# Patient Record
Sex: Male | Born: 1935
Health system: Southern US, Community
[De-identification: ages and names within clinical notes are randomized; demographics above are authoritative.]

## PROBLEM LIST (undated history)

## (undated) DIAGNOSIS — I2119 ST elevation (STEMI) myocardial infarction involving other coronary artery of inferior wall: Secondary | ICD-10-CM

## (undated) DIAGNOSIS — M519 Unspecified thoracic, thoracolumbar and lumbosacral intervertebral disc disorder: Secondary | ICD-10-CM

## (undated) DIAGNOSIS — I70229 Atherosclerosis of native arteries of extremities with rest pain, unspecified extremity: Secondary | ICD-10-CM

## (undated) DIAGNOSIS — I509 Heart failure, unspecified: Secondary | ICD-10-CM

## (undated) DIAGNOSIS — N289 Disorder of kidney and ureter, unspecified: Secondary | ICD-10-CM

## (undated) DIAGNOSIS — I2510 Atherosclerotic heart disease of native coronary artery without angina pectoris: Secondary | ICD-10-CM

## (undated) DIAGNOSIS — IMO0001 Reserved for inherently not codable concepts without codable children: Secondary | ICD-10-CM

## (undated) DIAGNOSIS — D649 Anemia, unspecified: Secondary | ICD-10-CM

## (undated) DIAGNOSIS — C4491 Basal cell carcinoma of skin, unspecified: Secondary | ICD-10-CM

## (undated) DIAGNOSIS — M545 Low back pain, unspecified: Secondary | ICD-10-CM

## (undated) DIAGNOSIS — R5383 Other fatigue: Secondary | ICD-10-CM

## (undated) DIAGNOSIS — Z9289 Personal history of other medical treatment: Secondary | ICD-10-CM

## (undated) DIAGNOSIS — E46 Unspecified protein-calorie malnutrition: Secondary | ICD-10-CM

## (undated) DIAGNOSIS — I2699 Other pulmonary embolism without acute cor pulmonale: Secondary | ICD-10-CM

## (undated) DIAGNOSIS — I5022 Chronic systolic (congestive) heart failure: Secondary | ICD-10-CM

## (undated) DIAGNOSIS — R011 Cardiac murmur, unspecified: Secondary | ICD-10-CM

## (undated) DIAGNOSIS — C9000 Multiple myeloma not having achieved remission: Secondary | ICD-10-CM

## (undated) DIAGNOSIS — I255 Ischemic cardiomyopathy: Secondary | ICD-10-CM

## (undated) DIAGNOSIS — I998 Other disorder of circulatory system: Secondary | ICD-10-CM

## (undated) DIAGNOSIS — Z8619 Personal history of other infectious and parasitic diseases: Secondary | ICD-10-CM

## (undated) DIAGNOSIS — G8929 Other chronic pain: Secondary | ICD-10-CM

## (undated) DIAGNOSIS — Z5189 Encounter for other specified aftercare: Secondary | ICD-10-CM

## (undated) DIAGNOSIS — E785 Hyperlipidemia, unspecified: Secondary | ICD-10-CM

## (undated) DIAGNOSIS — M869 Osteomyelitis, unspecified: Secondary | ICD-10-CM

## (undated) DIAGNOSIS — G629 Polyneuropathy, unspecified: Secondary | ICD-10-CM

## (undated) DIAGNOSIS — I10 Essential (primary) hypertension: Secondary | ICD-10-CM

## (undated) DIAGNOSIS — I739 Peripheral vascular disease, unspecified: Secondary | ICD-10-CM

## (undated) DIAGNOSIS — R0602 Shortness of breath: Secondary | ICD-10-CM

## (undated) HISTORY — DX: Cardiac murmur, unspecified: R01.1

## (undated) HISTORY — DX: Polyneuropathy, unspecified: G62.9

## (undated) HISTORY — PX: FRACTURE SURGERY: SHX138

## (undated) HISTORY — PX: CATARACT EXTRACTION W/ INTRAOCULAR LENS  IMPLANT, BILATERAL: SHX1307

## (undated) HISTORY — DX: Atherosclerosis of native arteries of extremities with rest pain, unspecified extremity: I70.229

## (undated) HISTORY — PX: TONSILLECTOMY: SUR1361

## (undated) HISTORY — DX: Chronic systolic (congestive) heart failure: I50.22

## (undated) HISTORY — DX: Peripheral vascular disease, unspecified: I73.9

## (undated) HISTORY — PX: INGUINAL HERNIA REPAIR: SUR1180

## (undated) HISTORY — DX: Osteomyelitis, unspecified: M86.9

## (undated) HISTORY — DX: ST elevation (STEMI) myocardial infarction involving other coronary artery of inferior wall: I21.19

## (undated) HISTORY — PX: BACK SURGERY: SHX140

## (undated) HISTORY — DX: Hyperlipidemia, unspecified: E78.5

## (undated) HISTORY — DX: Atherosclerotic heart disease of native coronary artery without angina pectoris: I25.10

## (undated) HISTORY — DX: Other disorder of circulatory system: I99.8

## (undated) HISTORY — PX: TOE AMPUTATION: SHX809

## (undated) HISTORY — DX: Essential (primary) hypertension: I10

## (undated) HISTORY — PX: BASAL CELL CARCINOMA EXCISION: SHX1214

## (undated) HISTORY — DX: Heart failure, unspecified: I50.9

## (undated) HISTORY — DX: Other fatigue: R53.83

## (undated) HISTORY — DX: Unspecified protein-calorie malnutrition: E46

## (undated) HISTORY — DX: Personal history of other medical treatment: Z92.89

## (undated) HISTORY — DX: Unspecified thoracic, thoracolumbar and lumbosacral intervertebral disc disorder: M51.9

## (undated) HISTORY — DX: Disorder of kidney and ureter, unspecified: N28.9

## (undated) HISTORY — DX: Shortness of breath: R06.02

## (undated) HISTORY — PX: LUMBAR LAMINECTOMY: SHX95

## (undated) HISTORY — DX: Ischemic cardiomyopathy: I25.5

---

## 1984-11-20 DIAGNOSIS — I2119 ST elevation (STEMI) myocardial infarction involving other coronary artery of inferior wall: Secondary | ICD-10-CM

## 1984-11-20 HISTORY — DX: ST elevation (STEMI) myocardial infarction involving other coronary artery of inferior wall: I21.19

## 1998-03-24 ENCOUNTER — Other Ambulatory Visit: Admission: RE | Admit: 1998-03-24 | Discharge: 1998-03-24 | Payer: Self-pay | Admitting: *Deleted

## 2000-06-20 ENCOUNTER — Ambulatory Visit (HOSPITAL_COMMUNITY): Admission: RE | Admit: 2000-06-20 | Discharge: 2000-06-20 | Payer: Self-pay | Admitting: Cardiology

## 2000-06-20 ENCOUNTER — Encounter: Payer: Self-pay | Admitting: Cardiology

## 2000-06-20 HISTORY — PX: CARDIAC CATHETERIZATION: SHX172

## 2000-06-22 ENCOUNTER — Inpatient Hospital Stay (HOSPITAL_COMMUNITY)
Admission: RE | Admit: 2000-06-22 | Discharge: 2000-06-28 | Payer: Self-pay | Admitting: Thoracic Surgery (Cardiothoracic Vascular Surgery)

## 2000-06-22 ENCOUNTER — Encounter: Payer: Self-pay | Admitting: Thoracic Surgery (Cardiothoracic Vascular Surgery)

## 2000-06-23 ENCOUNTER — Encounter: Payer: Self-pay | Admitting: Thoracic Surgery (Cardiothoracic Vascular Surgery)

## 2000-06-24 ENCOUNTER — Encounter: Payer: Self-pay | Admitting: Thoracic Surgery (Cardiothoracic Vascular Surgery)

## 2000-06-25 ENCOUNTER — Encounter: Payer: Self-pay | Admitting: Thoracic Surgery (Cardiothoracic Vascular Surgery)

## 2000-08-15 ENCOUNTER — Encounter (HOSPITAL_COMMUNITY): Admission: RE | Admit: 2000-08-15 | Discharge: 2000-11-13 | Payer: Self-pay | Admitting: Cardiology

## 2000-11-14 ENCOUNTER — Encounter (HOSPITAL_COMMUNITY): Admission: RE | Admit: 2000-11-14 | Discharge: 2000-11-29 | Payer: Self-pay | Admitting: Cardiology

## 2000-11-20 HISTORY — PX: CORONARY ARTERY BYPASS GRAFT: SHX141

## 2000-11-27 ENCOUNTER — Encounter (INDEPENDENT_AMBULATORY_CARE_PROVIDER_SITE_OTHER): Payer: Self-pay | Admitting: Specialist

## 2000-11-27 ENCOUNTER — Ambulatory Visit (HOSPITAL_BASED_OUTPATIENT_CLINIC_OR_DEPARTMENT_OTHER): Admission: RE | Admit: 2000-11-27 | Discharge: 2000-11-27 | Payer: Self-pay

## 2000-12-31 ENCOUNTER — Encounter (HOSPITAL_COMMUNITY): Admission: RE | Admit: 2000-12-31 | Discharge: 2001-03-31 | Payer: Self-pay | Admitting: Cardiology

## 2001-04-01 ENCOUNTER — Encounter (HOSPITAL_COMMUNITY): Admission: RE | Admit: 2001-04-01 | Discharge: 2001-06-30 | Payer: Self-pay | Admitting: Cardiology

## 2001-07-01 ENCOUNTER — Encounter (HOSPITAL_COMMUNITY): Admission: RE | Admit: 2001-07-01 | Discharge: 2001-09-29 | Payer: Self-pay | Admitting: Cardiology

## 2002-01-17 ENCOUNTER — Ambulatory Visit (HOSPITAL_COMMUNITY): Admission: RE | Admit: 2002-01-17 | Discharge: 2002-01-17 | Payer: Self-pay | Admitting: Gastroenterology

## 2006-02-14 ENCOUNTER — Ambulatory Visit: Payer: Self-pay | Admitting: Oncology

## 2006-02-21 LAB — COMPREHENSIVE METABOLIC PANEL
ALT: 13 U/L (ref 0–40)
Albumin: 3.9 g/dL (ref 3.5–5.2)
Alkaline Phosphatase: 41 U/L (ref 39–117)
CO2: 22 mEq/L (ref 19–32)
Potassium: 5.1 mEq/L (ref 3.5–5.3)
Sodium: 139 mEq/L (ref 135–145)
Total Bilirubin: 0.5 mg/dL (ref 0.3–1.2)
Total Protein: 6.4 g/dL (ref 6.0–8.3)

## 2006-02-21 LAB — CBC WITH DIFFERENTIAL/PLATELET
BASO%: 0.2 % (ref 0.0–2.0)
EOS%: 0.8 % (ref 0.0–7.0)
MCH: 35.5 pg — ABNORMAL HIGH (ref 28.0–33.4)
MCHC: 33.9 g/dL (ref 32.0–35.9)
MONO#: 0.3 10*3/uL (ref 0.1–0.9)
RBC: 2.66 10*6/uL — ABNORMAL LOW (ref 4.20–5.71)
RDW: 15.1 % — ABNORMAL HIGH (ref 11.2–14.6)
WBC: 5.1 10*3/uL (ref 4.0–10.0)
lymph#: 1.3 10*3/uL (ref 0.9–3.3)

## 2006-02-22 LAB — PROTEIN ELECTROPHORESIS, SERUM
Alpha-2-Globulin: 12.5 % — ABNORMAL HIGH (ref 7.1–11.8)
Beta Globulin: 5.8 % (ref 4.7–7.2)
Gamma Globulin: 12.9 % (ref 11.1–18.8)

## 2006-02-22 LAB — IRON AND TIBC
%SAT: 24 % (ref 20–55)
Iron: 65 ug/dL (ref 42–165)
UIBC: 204 ug/dL

## 2006-02-22 LAB — FOLATE: Folate: >20 ng/mL

## 2006-04-02 ENCOUNTER — Ambulatory Visit: Payer: Self-pay | Admitting: Oncology

## 2006-04-04 LAB — CBC WITH DIFFERENTIAL/PLATELET
BASO%: 0.3 % (ref 0.0–2.0)
Eosinophils Absolute: 0 10*3/uL (ref 0.0–0.5)
LYMPH%: 18.2 % (ref 14.0–48.0)
MCHC: 34.6 g/dL (ref 32.0–35.9)
MONO#: 0.4 10*3/uL (ref 0.1–0.9)
MONO%: 6.5 % (ref 0.0–13.0)
NEUT#: 5.1 10*3/uL (ref 1.5–6.5)
Platelets: 221 10*3/uL (ref 145–400)
RBC: 2.6 10*6/uL — ABNORMAL LOW (ref 4.20–5.71)
RDW: 15.1 % — ABNORMAL HIGH (ref 11.2–14.6)
WBC: 6.9 10*3/uL (ref 4.0–10.0)

## 2006-04-17 ENCOUNTER — Inpatient Hospital Stay (HOSPITAL_COMMUNITY): Admission: EM | Admit: 2006-04-17 | Discharge: 2006-04-24 | Payer: Self-pay | Admitting: Emergency Medicine

## 2006-04-18 ENCOUNTER — Ambulatory Visit: Payer: Self-pay | Admitting: Oncology

## 2006-04-23 ENCOUNTER — Encounter (INDEPENDENT_AMBULATORY_CARE_PROVIDER_SITE_OTHER): Payer: Self-pay | Admitting: Specialist

## 2006-04-26 ENCOUNTER — Ambulatory Visit (HOSPITAL_COMMUNITY): Admission: RE | Admit: 2006-04-26 | Discharge: 2006-04-26 | Payer: Self-pay | Admitting: Oncology

## 2006-04-26 ENCOUNTER — Encounter (INDEPENDENT_AMBULATORY_CARE_PROVIDER_SITE_OTHER): Payer: Self-pay | Admitting: *Deleted

## 2006-05-03 LAB — CBC WITH DIFFERENTIAL/PLATELET
Basophils Absolute: 0 10*3/uL (ref 0.0–0.1)
EOS%: 1 % (ref 0.0–7.0)
HCT: 31.5 % — ABNORMAL LOW (ref 38.7–49.9)
HGB: 10.7 g/dL — ABNORMAL LOW (ref 13.0–17.1)
LYMPH%: 21.3 % (ref 14.0–48.0)
MCH: 33.3 pg (ref 28.0–33.4)
MCV: 98.4 fL — ABNORMAL HIGH (ref 81.6–98.0)
MONO%: 8.8 % (ref 0.0–13.0)
NEUT%: 68.5 % (ref 40.0–75.0)

## 2006-05-04 LAB — IGG, IGA, IGM: IgM, Serum: 26 mg/dL — ABNORMAL LOW (ref 60–263)

## 2006-05-04 LAB — BETA 2 MICROGLOBULIN, SERUM: Beta-2 Microglobulin: 6.82 mg/L — ABNORMAL HIGH (ref 1.01–1.73)

## 2006-05-04 LAB — COMPREHENSIVE METABOLIC PANEL
ALT: 15 U/L (ref 0–40)
AST: 21 U/L (ref 0–37)
Alkaline Phosphatase: 36 U/L — ABNORMAL LOW (ref 39–117)
Sodium: 138 mEq/L (ref 135–145)
Total Bilirubin: 0.4 mg/dL (ref 0.3–1.2)
Total Protein: 6.5 g/dL (ref 6.0–8.3)

## 2006-05-09 LAB — CBC WITH DIFFERENTIAL/PLATELET
BASO%: 0.1 % (ref 0.0–2.0)
Eosinophils Absolute: 0 10*3/uL (ref 0.0–0.5)
LYMPH%: 18.7 % (ref 14.0–48.0)
MCH: 33.3 pg (ref 28.0–33.4)
MCHC: 34.2 g/dL (ref 32.0–35.9)
MCV: 97.5 fL (ref 81.6–98.0)
MONO%: 0.6 % (ref 0.0–13.0)
Platelets: 284 10*3/uL (ref 145–400)
RBC: 2.95 10*6/uL — ABNORMAL LOW (ref 4.20–5.71)

## 2006-05-09 LAB — PROTIME-INR: Protime: 18.9 Seconds — ABNORMAL HIGH (ref 10.6–13.4)

## 2006-05-14 ENCOUNTER — Ambulatory Visit: Payer: Self-pay | Admitting: Oncology

## 2006-05-16 ENCOUNTER — Ambulatory Visit (HOSPITAL_COMMUNITY): Admission: RE | Admit: 2006-05-16 | Discharge: 2006-05-16 | Payer: Self-pay | Admitting: Oncology

## 2006-05-16 LAB — CBC WITH DIFFERENTIAL/PLATELET
Basophils Absolute: 0 10*3/uL (ref 0.0–0.1)
Eosinophils Absolute: 0.2 10*3/uL (ref 0.0–0.5)
LYMPH%: 17.2 % (ref 14.0–48.0)
MCV: 99 fL — ABNORMAL HIGH (ref 81.6–98.0)
MONO%: 3.2 % (ref 0.0–13.0)
NEUT#: 6.8 10*3/uL — ABNORMAL HIGH (ref 1.5–6.5)
Platelets: 306 10*3/uL (ref 145–400)
RBC: 3.02 10*6/uL — ABNORMAL LOW (ref 4.20–5.71)

## 2006-05-16 LAB — BASIC METABOLIC PANEL
BUN: 53 mg/dL — ABNORMAL HIGH (ref 6–23)
CO2: 24 mEq/L (ref 19–32)
Chloride: 103 mEq/L (ref 96–112)
Glucose, Bld: 105 mg/dL — ABNORMAL HIGH (ref 70–99)
Potassium: 4.5 mEq/L (ref 3.5–5.3)

## 2006-05-16 LAB — PROTIME-INR

## 2006-05-18 ENCOUNTER — Ambulatory Visit (HOSPITAL_COMMUNITY): Admission: RE | Admit: 2006-05-18 | Discharge: 2006-05-18 | Payer: Self-pay | Admitting: Oncology

## 2006-05-18 LAB — PROTIME-INR
INR: 3.1 (ref 2.00–3.50)
Protime: 21.3 Seconds — ABNORMAL HIGH (ref 10.6–13.4)

## 2006-05-24 LAB — PROTIME-INR
INR: 1.4 — ABNORMAL LOW (ref 2.00–3.50)
Protime: 14.5 Seconds — ABNORMAL HIGH (ref 10.6–13.4)

## 2006-05-24 LAB — CBC WITH DIFFERENTIAL/PLATELET
Basophils Absolute: 0 10*3/uL (ref 0.0–0.1)
Eosinophils Absolute: 0.1 10*3/uL (ref 0.0–0.5)
HGB: 9.3 g/dL — ABNORMAL LOW (ref 13.0–17.1)
LYMPH%: 19.7 % (ref 14.0–48.0)
MONO#: 0.6 10*3/uL (ref 0.1–0.9)
NEUT#: 6.5 10*3/uL (ref 1.5–6.5)
Platelets: 223 10*3/uL (ref 145–400)
RBC: 2.82 10*6/uL — ABNORMAL LOW (ref 4.20–5.71)
WBC: 9.1 10*3/uL (ref 4.0–10.0)

## 2006-05-24 LAB — BASIC METABOLIC PANEL
Glucose, Bld: 77 mg/dL (ref 70–99)
Potassium: 4.3 mEq/L (ref 3.5–5.3)
Sodium: 139 mEq/L (ref 135–145)

## 2006-05-30 LAB — CBC WITH DIFFERENTIAL/PLATELET
Eosinophils Absolute: 0.1 10*3/uL (ref 0.0–0.5)
HCT: 27.3 % — ABNORMAL LOW (ref 38.7–49.9)
LYMPH%: 40.7 % (ref 14.0–48.0)
MONO#: 0.6 10*3/uL (ref 0.1–0.9)
NEUT#: 2.1 10*3/uL (ref 1.5–6.5)
NEUT%: 42.6 % (ref 40.0–75.0)
Platelets: 296 10*3/uL (ref 145–400)
WBC: 4.9 10*3/uL (ref 4.0–10.0)

## 2006-06-01 LAB — COMPREHENSIVE METABOLIC PANEL
ALT: 18 U/L (ref 0–40)
AST: 17 U/L (ref 0–37)
CO2: 23 mEq/L (ref 19–32)
Chloride: 109 mEq/L (ref 96–112)
Sodium: 143 mEq/L (ref 135–145)
Total Bilirubin: 0.5 mg/dL (ref 0.3–1.2)
Total Protein: 5.4 g/dL — ABNORMAL LOW (ref 6.0–8.3)

## 2006-06-01 LAB — IMMUNOFIXATION ELECTROPHORESIS
IgG (Immunoglobin G), Serum: 707 mg/dL (ref 694–1618)
IgM, Serum: 21 mg/dL — ABNORMAL LOW (ref 60–263)
Total Protein, Serum Electrophoresis: 5.4 g/dL — ABNORMAL LOW (ref 6.0–8.3)

## 2006-06-01 LAB — URIC ACID: Uric Acid, Serum: 6.1 mg/dL (ref 2.4–7.0)

## 2006-06-06 LAB — BASIC METABOLIC PANEL
CO2: 25 mEq/L (ref 19–32)
Calcium: 8.8 mg/dL (ref 8.4–10.5)
Creatinine, Ser: 1.45 mg/dL (ref 0.40–1.50)
Glucose, Bld: 102 mg/dL — ABNORMAL HIGH (ref 70–99)

## 2006-06-06 LAB — CBC WITH DIFFERENTIAL/PLATELET
BASO%: 0.7 % (ref 0.0–2.0)
EOS%: 4.9 % (ref 0.0–7.0)
HCT: 26.4 % — ABNORMAL LOW (ref 38.7–49.9)
MCH: 32 pg (ref 28.0–33.4)
MCHC: 33.8 g/dL (ref 32.0–35.9)
NEUT%: 45.9 % (ref 40.0–75.0)
RBC: 2.79 10*6/uL — ABNORMAL LOW (ref 4.20–5.71)
RDW: 19.5 % — ABNORMAL HIGH (ref 11.2–14.6)
WBC: 5.9 10*3/uL (ref 4.0–10.0)
lymph#: 2.3 10*3/uL (ref 0.9–3.3)

## 2006-06-06 LAB — PROTIME-INR: Protime: 18.3 Seconds — ABNORMAL HIGH (ref 10.6–13.4)

## 2006-06-07 LAB — UIFE/LIGHT CHAINS/TP QN, 24-HR UR
Free Kappa Lt Chains,Ur: 755 mg/dL — ABNORMAL HIGH (ref 0.04–1.51)
Free Kappa/Lambda Ratio: 1217.74 ratio — ABNORMAL HIGH (ref 0.46–4.00)
Free Lt Chn Excr Rate: 19630 mg/d
Gamma Globulin, Urine: DETECTED — AB
Total Protein, Urine: 757.4 mg/dL
Volume, Urine: 2600 mL

## 2006-06-20 LAB — CBC WITH DIFFERENTIAL/PLATELET
BASO%: 0.3 % (ref 0.0–2.0)
EOS%: 2.2 % (ref 0.0–7.0)
HCT: 25.9 % — ABNORMAL LOW (ref 38.7–49.9)
MCH: 31.8 pg (ref 28.0–33.4)
MCHC: 33.5 g/dL (ref 32.0–35.9)
MCV: 94.9 fL (ref 81.6–98.0)
MONO%: 12.6 % (ref 0.0–13.0)
NEUT%: 64.8 % (ref 40.0–75.0)
lymph#: 1.7 10*3/uL (ref 0.9–3.3)

## 2006-06-28 ENCOUNTER — Ambulatory Visit (HOSPITAL_COMMUNITY): Admission: RE | Admit: 2006-06-28 | Discharge: 2006-06-28 | Payer: Self-pay | Admitting: Neurological Surgery

## 2006-06-28 LAB — UIFE/LIGHT CHAINS/TP QN, 24-HR UR
Alpha 2, Urine: DETECTED — AB
Beta, Urine: DETECTED — AB
Free Kappa Lt Chains,Ur: 261 mg/dL — ABNORMAL HIGH (ref 0.04–1.51)
Free Kappa/Lambda Ratio: 686.84 ratio — ABNORMAL HIGH (ref 0.46–4.00)
Free Lt Chn Excr Rate: 7047 mg/d
Gamma Globulin, Urine: DETECTED — AB
Total Protein, Urine: 262.4 mg/dL

## 2006-07-02 ENCOUNTER — Ambulatory Visit: Payer: Self-pay | Admitting: Oncology

## 2006-07-02 LAB — CBC WITH DIFFERENTIAL/PLATELET
Basophils Absolute: 0.1 10*3/uL (ref 0.0–0.1)
EOS%: 2.5 % (ref 0.0–7.0)
Eosinophils Absolute: 0.2 10*3/uL (ref 0.0–0.5)
HCT: 25.2 % — ABNORMAL LOW (ref 38.7–49.9)
HGB: 8.4 g/dL — ABNORMAL LOW (ref 13.0–17.1)
MCH: 30.8 pg (ref 28.0–33.4)
MCV: 92.6 fL (ref 81.6–98.0)
MONO%: 13.4 % — ABNORMAL HIGH (ref 0.0–13.0)
NEUT#: 3.1 10*3/uL (ref 1.5–6.5)
NEUT%: 49.6 % (ref 40.0–75.0)
Platelets: 609 10*3/uL — ABNORMAL HIGH (ref 145–400)

## 2006-07-05 LAB — COMPREHENSIVE METABOLIC PANEL
AST: 15 U/L (ref 0–37)
Alkaline Phosphatase: 38 U/L — ABNORMAL LOW (ref 39–117)
BUN: 17 mg/dL (ref 6–23)
Calcium: 8.8 mg/dL (ref 8.4–10.5)
Creatinine, Ser: 1.05 mg/dL (ref 0.40–1.50)
Glucose, Bld: 92 mg/dL (ref 70–99)

## 2006-07-05 LAB — KAPPA/LAMBDA LIGHT CHAINS
Kappa free light chain: 133 mg/dL — ABNORMAL HIGH (ref 0.33–1.94)
Kappa:Lambda Ratio: 80.61 — ABNORMAL HIGH (ref 0.26–1.65)
Lambda Free Lght Chn: 1.65 mg/dL (ref 0.57–2.63)

## 2006-07-18 LAB — CBC WITH DIFFERENTIAL/PLATELET
Eosinophils Absolute: 0.2 10*3/uL (ref 0.0–0.5)
MONO#: 1.1 10*3/uL — ABNORMAL HIGH (ref 0.1–0.9)
MONO%: 10.3 % (ref 0.0–13.0)
NEUT#: 7.4 10*3/uL — ABNORMAL HIGH (ref 1.5–6.5)
RBC: 2.99 10*6/uL — ABNORMAL LOW (ref 4.20–5.71)
RDW: 20.8 % — ABNORMAL HIGH (ref 11.2–14.6)
WBC: 10.6 10*3/uL — ABNORMAL HIGH (ref 4.0–10.0)
lymph#: 1.9 10*3/uL (ref 0.9–3.3)

## 2006-08-01 LAB — CBC WITH DIFFERENTIAL/PLATELET
Eosinophils Absolute: 0.3 10*3/uL (ref 0.0–0.5)
HGB: 9.3 g/dL — ABNORMAL LOW (ref 13.0–17.1)
LYMPH%: 38.3 % (ref 14.0–48.0)
MONO#: 0.9 10*3/uL (ref 0.1–0.9)
NEUT#: 3.5 10*3/uL (ref 1.5–6.5)
Platelets: 526 10*3/uL — ABNORMAL HIGH (ref 145–400)
RBC: 3.13 10*6/uL — ABNORMAL LOW (ref 4.20–5.71)
WBC: 7.7 10*3/uL (ref 4.0–10.0)

## 2006-08-15 LAB — CBC WITH DIFFERENTIAL/PLATELET
Eosinophils Absolute: 0 10*3/uL (ref 0.0–0.5)
HGB: 9.4 g/dL — ABNORMAL LOW (ref 13.0–17.1)
MCV: 91.2 fL (ref 81.6–98.0)
MONO#: 0.4 10*3/uL (ref 0.1–0.9)
MONO%: 3.8 % (ref 0.0–13.0)
NEUT#: 7.6 10*3/uL — ABNORMAL HIGH (ref 1.5–6.5)
RBC: 3.1 10*6/uL — ABNORMAL LOW (ref 4.20–5.71)
RDW: 20.8 % — ABNORMAL HIGH (ref 11.2–14.6)
WBC: 9.4 10*3/uL (ref 4.0–10.0)
lymph#: 1.5 10*3/uL (ref 0.9–3.3)

## 2006-08-27 ENCOUNTER — Ambulatory Visit: Payer: Self-pay | Admitting: Oncology

## 2006-09-07 ENCOUNTER — Ambulatory Visit (HOSPITAL_COMMUNITY): Admission: RE | Admit: 2006-09-07 | Discharge: 2006-09-07 | Payer: Self-pay | Admitting: Oncology

## 2006-09-26 LAB — CBC WITH DIFFERENTIAL/PLATELET
BASO%: 1.1 % (ref 0.0–2.0)
Eosinophils Absolute: 0.1 10*3/uL (ref 0.0–0.5)
HCT: 25.6 % — ABNORMAL LOW (ref 38.7–49.9)
HGB: 8.4 g/dL — ABNORMAL LOW (ref 13.0–17.1)
MCHC: 33.1 g/dL (ref 32.0–35.9)
MONO#: 0.4 10*3/uL (ref 0.1–0.9)
NEUT#: 6 10*3/uL (ref 1.5–6.5)
NEUT%: 70.6 % (ref 40.0–75.0)
Platelets: 474 10*3/uL — ABNORMAL HIGH (ref 145–400)
WBC: 8.4 10*3/uL (ref 4.0–10.0)
lymph#: 1.9 10*3/uL (ref 0.9–3.3)

## 2006-10-02 LAB — CBC & DIFF AND RETIC
Basophils Absolute: 0 10*3/uL (ref 0.0–0.1)
EOS%: 0.9 % (ref 0.0–7.0)
HCT: 23.2 % — ABNORMAL LOW (ref 38.7–49.9)
HGB: 7.7 g/dL — ABNORMAL LOW (ref 13.0–17.1)
IRF: 0.55 — ABNORMAL HIGH (ref 0.070–0.380)
LYMPH%: 30.3 % (ref 14.0–48.0)
MCH: 29.1 pg (ref 28.0–33.4)
MONO#: 0.7 10*3/uL (ref 0.1–0.9)
NEUT%: 58.5 % (ref 40.0–75.0)
Platelets: 608 10*3/uL — ABNORMAL HIGH (ref 145–400)
lymph#: 2.1 10*3/uL (ref 0.9–3.3)

## 2006-10-03 ENCOUNTER — Encounter (HOSPITAL_COMMUNITY): Admission: RE | Admit: 2006-10-03 | Discharge: 2006-11-15 | Payer: Self-pay | Admitting: Oncology

## 2006-10-05 LAB — KAPPA/LAMBDA LIGHT CHAINS
Kappa free light chain: 42.2 mg/dL — ABNORMAL HIGH (ref 0.33–1.94)
Lambda Free Lght Chn: 1.6 mg/dL (ref 0.57–2.63)

## 2006-10-05 LAB — BETA 2 MICROGLOBULIN, SERUM: Beta-2 Microglobulin: 3.09 mg/L — ABNORMAL HIGH (ref 1.01–1.73)

## 2006-10-09 LAB — CBC WITH DIFFERENTIAL/PLATELET
BASO%: 2.5 % — ABNORMAL HIGH (ref 0.0–2.0)
EOS%: 0.9 % (ref 0.0–7.0)
HCT: 31.6 % — ABNORMAL LOW (ref 38.7–49.9)
LYMPH%: 28.5 % (ref 14.0–48.0)
MCH: 29.8 pg (ref 28.0–33.4)
MCHC: 33.4 g/dL (ref 32.0–35.9)
MONO#: 0.9 10*3/uL (ref 0.1–0.9)
NEUT%: 56 % (ref 40.0–75.0)
Platelets: 521 10*3/uL — ABNORMAL HIGH (ref 145–400)

## 2006-10-22 ENCOUNTER — Ambulatory Visit: Payer: Self-pay | Admitting: Oncology

## 2006-10-24 LAB — CBC WITH DIFFERENTIAL/PLATELET
BASO%: 0.6 % (ref 0.0–2.0)
EOS%: 0.5 % (ref 0.0–7.0)
HCT: 36.7 % — ABNORMAL LOW (ref 38.7–49.9)
LYMPH%: 15.8 % (ref 14.0–48.0)
MCH: 30.3 pg (ref 28.0–33.4)
MCHC: 33.1 g/dL (ref 32.0–35.9)
NEUT%: 73.2 % (ref 40.0–75.0)
RBC: 4.02 10*6/uL — ABNORMAL LOW (ref 4.20–5.71)
lymph#: 2.2 10*3/uL (ref 0.9–3.3)

## 2006-10-24 LAB — BASIC METABOLIC PANEL
Calcium: 8.5 mg/dL (ref 8.4–10.5)
Creatinine, Ser: 0.98 mg/dL (ref 0.40–1.50)
Sodium: 142 mEq/L (ref 135–145)

## 2006-10-30 LAB — CBC WITH DIFFERENTIAL/PLATELET
BASO%: 0.5 % (ref 0.0–2.0)
HCT: 34.6 % — ABNORMAL LOW (ref 38.7–49.9)
MCHC: 33 g/dL (ref 32.0–35.9)
MONO#: 0.9 10*3/uL (ref 0.1–0.9)
NEUT%: 71.7 % (ref 40.0–75.0)
RBC: 3.79 10*6/uL — ABNORMAL LOW (ref 4.20–5.71)
WBC: 11.9 10*3/uL — ABNORMAL HIGH (ref 4.0–10.0)
lymph#: 2.3 10*3/uL (ref 0.9–3.3)

## 2006-11-01 LAB — COMPREHENSIVE METABOLIC PANEL
ALT: 20 U/L (ref 0–53)
AST: 17 U/L (ref 0–37)
Albumin: 3.4 g/dL — ABNORMAL LOW (ref 3.5–5.2)
Calcium: 8.7 mg/dL (ref 8.4–10.5)
Chloride: 107 mEq/L (ref 96–112)
Potassium: 4.4 mEq/L (ref 3.5–5.3)
Total Protein: 6.3 g/dL (ref 6.0–8.3)

## 2006-11-01 LAB — IGG, IGA, IGM: IgM, Serum: 96 mg/dL (ref 60–263)

## 2006-11-01 LAB — KAPPA/LAMBDA LIGHT CHAINS: Kappa:Lambda Ratio: 21.89 — ABNORMAL HIGH (ref 0.26–1.65)

## 2006-11-02 LAB — CBC WITH DIFFERENTIAL/PLATELET
Basophils Absolute: 0.1 10*3/uL (ref 0.0–0.1)
HCT: 33.9 % — ABNORMAL LOW (ref 38.7–49.9)
HGB: 10.9 g/dL — ABNORMAL LOW (ref 13.0–17.1)
MONO#: 1 10*3/uL — ABNORMAL HIGH (ref 0.1–0.9)
NEUT#: 10.7 10*3/uL — ABNORMAL HIGH (ref 1.5–6.5)
NEUT%: 73.5 % (ref 40.0–75.0)
WBC: 14.6 10*3/uL — ABNORMAL HIGH (ref 4.0–10.0)
lymph#: 2.3 10*3/uL (ref 0.9–3.3)

## 2006-11-06 LAB — CBC WITH DIFFERENTIAL/PLATELET
Basophils Absolute: 0.1 10*3/uL (ref 0.0–0.1)
EOS%: 2.5 % (ref 0.0–7.0)
HCT: 33.8 % — ABNORMAL LOW (ref 38.7–49.9)
HGB: 11 g/dL — ABNORMAL LOW (ref 13.0–17.1)
MCH: 29.5 pg (ref 28.0–33.4)
MCV: 90.4 fL (ref 81.6–98.0)
MONO%: 10.7 % (ref 0.0–13.0)
NEUT%: 67 % (ref 40.0–75.0)
lymph#: 2.5 10*3/uL (ref 0.9–3.3)

## 2006-11-09 LAB — CBC WITH DIFFERENTIAL/PLATELET
BASO%: 3.1 % — ABNORMAL HIGH (ref 0.0–2.0)
Basophils Absolute: 0.3 10*3/uL — ABNORMAL HIGH (ref 0.0–0.1)
EOS%: 0.9 % (ref 0.0–7.0)
Eosinophils Absolute: 0.1 10*3/uL (ref 0.0–0.5)
HCT: 31.5 % — ABNORMAL LOW (ref 38.7–49.9)
HGB: 10.6 g/dL — ABNORMAL LOW (ref 13.0–17.1)
LYMPH%: 24.6 % (ref 14.0–48.0)
MCH: 29.6 pg (ref 28.0–33.4)
MCHC: 33.6 g/dL (ref 32.0–35.9)
MCV: 87.8 fL (ref 81.6–98.0)
MONO#: 1.8 10*3/uL — ABNORMAL HIGH (ref 0.1–0.9)
MONO%: 16.3 % — ABNORMAL HIGH (ref 0.0–13.0)
NEUT#: 6 10*3/uL (ref 1.5–6.5)
NEUT%: 55.2 % (ref 40.0–75.0)
Platelets: 281 10*3/uL (ref 145–400)
RBC: 3.59 10*6/uL — ABNORMAL LOW (ref 4.20–5.71)
RDW: 17.3 % — ABNORMAL HIGH (ref 11.2–14.6)
WBC: 10.9 10*3/uL — ABNORMAL HIGH (ref 4.0–10.0)
lymph#: 2.7 10*3/uL (ref 0.9–3.3)

## 2006-11-16 ENCOUNTER — Ambulatory Visit (HOSPITAL_COMMUNITY): Admission: RE | Admit: 2006-11-16 | Discharge: 2006-11-16 | Payer: Self-pay | Admitting: Oncology

## 2006-11-16 LAB — COMPREHENSIVE METABOLIC PANEL
ALT: 25 U/L (ref 0–53)
Albumin: 3.1 g/dL — ABNORMAL LOW (ref 3.5–5.2)
CO2: 23 mEq/L (ref 19–32)
Glucose, Bld: 87 mg/dL (ref 70–99)
Potassium: 4.6 mEq/L (ref 3.5–5.3)
Sodium: 137 mEq/L (ref 135–145)
Total Bilirubin: 0.6 mg/dL (ref 0.3–1.2)
Total Protein: 5.2 g/dL — ABNORMAL LOW (ref 6.0–8.3)

## 2006-11-16 LAB — CBC WITH DIFFERENTIAL/PLATELET
Basophils Absolute: 0 10*3/uL (ref 0.0–0.1)
Eosinophils Absolute: 0 10*3/uL (ref 0.0–0.5)
HGB: 11.1 g/dL — ABNORMAL LOW (ref 13.0–17.1)
MCV: 91.1 fL (ref 81.6–98.0)
MONO#: 1.6 10*3/uL — ABNORMAL HIGH (ref 0.1–0.9)
NEUT#: 21.1 10*3/uL — ABNORMAL HIGH (ref 1.5–6.5)
RBC: 3.65 10*6/uL — ABNORMAL LOW (ref 4.20–5.71)
RDW: 21 % — ABNORMAL HIGH (ref 11.2–14.6)
WBC: 24.7 10*3/uL — ABNORMAL HIGH (ref 4.0–10.0)
lymph#: 2 10*3/uL (ref 0.9–3.3)

## 2006-11-16 LAB — TECHNOLOGIST REVIEW

## 2006-11-19 LAB — CBC WITH DIFFERENTIAL/PLATELET
BASO%: 0.6 % (ref 0.0–2.0)
Eosinophils Absolute: 0.3 10*3/uL (ref 0.0–0.5)
LYMPH%: 10.8 % — ABNORMAL LOW (ref 14.0–48.0)
MCHC: 33.5 g/dL (ref 32.0–35.9)
MCV: 88.7 fL (ref 81.6–98.0)
MONO#: 2.1 10*3/uL — ABNORMAL HIGH (ref 0.1–0.9)
MONO%: 11.5 % (ref 0.0–13.0)
NEUT#: 13.8 10*3/uL — ABNORMAL HIGH (ref 1.5–6.5)
Platelets: 302 10*3/uL (ref 145–400)
RBC: 3.24 10*6/uL — ABNORMAL LOW (ref 4.20–5.71)
RDW: 17.3 % — ABNORMAL HIGH (ref 11.2–14.6)
WBC: 18.3 10*3/uL — ABNORMAL HIGH (ref 4.0–10.0)

## 2006-11-19 LAB — TECHNOLOGIST REVIEW

## 2006-11-22 LAB — CBC WITH DIFFERENTIAL/PLATELET
Basophils Absolute: 0.2 10*3/uL — ABNORMAL HIGH (ref 0.0–0.1)
EOS%: 1.1 % (ref 0.0–7.0)
HGB: 10.5 g/dL — ABNORMAL LOW (ref 13.0–17.1)
MCH: 29.5 pg (ref 28.0–33.4)
MONO#: 1.6 10*3/uL — ABNORMAL HIGH (ref 0.1–0.9)
NEUT#: 14.9 10*3/uL — ABNORMAL HIGH (ref 1.5–6.5)
RDW: 17 % — ABNORMAL HIGH (ref 11.2–14.6)
WBC: 19 10*3/uL — ABNORMAL HIGH (ref 4.0–10.0)
lymph#: 2.2 10*3/uL (ref 0.9–3.3)

## 2006-11-26 LAB — CBC WITH DIFFERENTIAL/PLATELET
Basophils Absolute: 0.1 10*3/uL (ref 0.0–0.1)
Eosinophils Absolute: 0 10*3/uL (ref 0.0–0.5)
HCT: 31.6 % — ABNORMAL LOW (ref 38.7–49.9)
HGB: 10.6 g/dL — ABNORMAL LOW (ref 13.0–17.1)
MCV: 88.8 fL (ref 81.6–98.0)
MONO%: 9.5 % (ref 0.0–13.0)
NEUT#: 6.9 10*3/uL — ABNORMAL HIGH (ref 1.5–6.5)
RDW: 17.3 % — ABNORMAL HIGH (ref 11.2–14.6)
lymph#: 2.2 10*3/uL (ref 0.9–3.3)

## 2006-11-29 LAB — CBC WITH DIFFERENTIAL/PLATELET
BASO%: 1 % (ref 0.0–2.0)
EOS%: 0.4 % (ref 0.0–7.0)
MCH: 29.7 pg (ref 28.0–33.4)
MCHC: 33.8 g/dL (ref 32.0–35.9)
MONO#: 1 10*3/uL — ABNORMAL HIGH (ref 0.1–0.9)
NEUT%: 67.9 % (ref 40.0–75.0)
RBC: 3.54 10*6/uL — ABNORMAL LOW (ref 4.20–5.71)
RDW: 17.4 % — ABNORMAL HIGH (ref 11.2–14.6)
WBC: 10.3 10*3/uL — ABNORMAL HIGH (ref 4.0–10.0)
lymph#: 2.2 10*3/uL (ref 0.9–3.3)

## 2006-11-29 LAB — PROTIME-INR: Protime: 14.4 Seconds — ABNORMAL HIGH (ref 10.6–13.4)

## 2006-12-05 LAB — CBC WITH DIFFERENTIAL/PLATELET
BASO%: 0.8 % (ref 0.0–2.0)
EOS%: 0.2 % (ref 0.0–7.0)
HCT: 31.6 % — ABNORMAL LOW (ref 38.7–49.9)
LYMPH%: 13.1 % — ABNORMAL LOW (ref 14.0–48.0)
MCH: 29.9 pg (ref 28.0–33.4)
MCHC: 33.3 g/dL (ref 32.0–35.9)
NEUT%: 73.6 % (ref 40.0–75.0)
RBC: 3.51 10*6/uL — ABNORMAL LOW (ref 4.20–5.71)
lymph#: 2.3 10*3/uL (ref 0.9–3.3)

## 2006-12-05 LAB — PROTIME-INR: Protime: 13.2 Seconds (ref 10.6–13.4)

## 2006-12-06 ENCOUNTER — Ambulatory Visit: Payer: Self-pay | Admitting: Oncology

## 2006-12-10 ENCOUNTER — Encounter: Admission: RE | Admit: 2006-12-10 | Discharge: 2006-12-10 | Payer: Self-pay

## 2006-12-11 LAB — CBC WITH DIFFERENTIAL/PLATELET
Basophils Absolute: 0.2 10*3/uL — ABNORMAL HIGH (ref 0.0–0.1)
Eosinophils Absolute: 0 10*3/uL (ref 0.0–0.5)
HCT: 32.5 % — ABNORMAL LOW (ref 38.7–49.9)
HGB: 11 g/dL — ABNORMAL LOW (ref 13.0–17.1)
MCH: 31 pg (ref 28.0–33.4)
MCV: 91.5 fL (ref 81.6–98.0)
MONO%: 15.3 % — ABNORMAL HIGH (ref 0.0–13.0)
NEUT#: 4.9 10*3/uL (ref 1.5–6.5)
NEUT%: 54 % (ref 40.0–75.0)
RDW: 17.3 % — ABNORMAL HIGH (ref 11.2–14.6)

## 2006-12-13 LAB — KAPPA/LAMBDA LIGHT CHAINS
Kappa free light chain: 6.11 mg/dL — ABNORMAL HIGH (ref 0.33–1.94)
Lambda Free Lght Chn: 1.38 mg/dL (ref 0.57–2.63)

## 2006-12-14 LAB — CBC WITH DIFFERENTIAL/PLATELET
Basophils Absolute: 0.2 10*3/uL — ABNORMAL HIGH (ref 0.0–0.1)
Eosinophils Absolute: 0 10*3/uL (ref 0.0–0.5)
HGB: 11.6 g/dL — ABNORMAL LOW (ref 13.0–17.1)
MONO#: 1.1 10*3/uL — ABNORMAL HIGH (ref 0.1–0.9)
NEUT#: 4.8 10*3/uL (ref 1.5–6.5)
Platelets: 642 10*3/uL — ABNORMAL HIGH (ref 145–400)
RBC: 3.82 10*6/uL — ABNORMAL LOW (ref 4.20–5.71)
RDW: 18.6 % — ABNORMAL HIGH (ref 11.2–14.6)
WBC: 8.1 10*3/uL (ref 4.0–10.0)

## 2006-12-18 LAB — CBC WITH DIFFERENTIAL/PLATELET
Basophils Absolute: 0.1 10*3/uL (ref 0.0–0.1)
Eosinophils Absolute: 0 10*3/uL (ref 0.0–0.5)
HCT: 35.2 % — ABNORMAL LOW (ref 38.7–49.9)
HGB: 11.5 g/dL — ABNORMAL LOW (ref 13.0–17.1)
LYMPH%: 29.8 % (ref 14.0–48.0)
MONO#: 1.1 10*3/uL — ABNORMAL HIGH (ref 0.1–0.9)
NEUT#: 4.4 10*3/uL (ref 1.5–6.5)
NEUT%: 54.8 % (ref 40.0–75.0)
Platelets: 438 10*3/uL — ABNORMAL HIGH (ref 145–400)
WBC: 8 10*3/uL (ref 4.0–10.0)

## 2006-12-21 LAB — CBC WITH DIFFERENTIAL/PLATELET
BASO%: 1.1 % (ref 0.0–2.0)
Basophils Absolute: 0.1 10*3/uL (ref 0.0–0.1)
EOS%: 0.1 % (ref 0.0–7.0)
Eosinophils Absolute: 0 10*3/uL (ref 0.0–0.5)
HCT: 35.3 % — ABNORMAL LOW (ref 38.7–49.9)
HGB: 11.7 g/dL — ABNORMAL LOW (ref 13.0–17.1)
LYMPH%: 17.7 % (ref 14.0–48.0)
MCH: 31.1 pg (ref 28.0–33.4)
MCHC: 33.3 g/dL (ref 32.0–35.9)
MCV: 93.5 fL (ref 81.6–98.0)
MONO#: 1.5 10*3/uL — ABNORMAL HIGH (ref 0.1–0.9)
MONO%: 14.6 % — ABNORMAL HIGH (ref 0.0–13.0)
NEUT#: 6.9 10*3/uL — ABNORMAL HIGH (ref 1.5–6.5)
NEUT%: 66.5 % (ref 40.0–75.0)
Platelets: 275 10*3/uL (ref 145–400)
RBC: 3.77 10*6/uL — ABNORMAL LOW (ref 4.20–5.71)
RDW: 17.2 % — ABNORMAL HIGH (ref 11.2–14.6)
WBC: 10.4 10*3/uL — ABNORMAL HIGH (ref 4.0–10.0)
lymph#: 1.9 10*3/uL (ref 0.9–3.3)

## 2006-12-26 ENCOUNTER — Encounter: Admission: RE | Admit: 2006-12-26 | Discharge: 2006-12-26 | Payer: Self-pay

## 2006-12-26 LAB — CBC WITH DIFFERENTIAL/PLATELET
BASO%: 0.5 % (ref 0.0–2.0)
EOS%: 0.1 % (ref 0.0–7.0)
MCH: 31.1 pg (ref 28.0–33.4)
MCHC: 33.6 g/dL (ref 32.0–35.9)
MCV: 92.8 fL (ref 81.6–98.0)
MONO%: 11.9 % (ref 0.0–13.0)
RBC: 3.66 10*6/uL — ABNORMAL LOW (ref 4.20–5.71)
RDW: 16.5 % — ABNORMAL HIGH (ref 11.2–14.6)

## 2007-01-01 LAB — CBC & DIFF AND RETIC
Eosinophils Absolute: 0 10*3/uL (ref 0.0–0.5)
MCV: 92.5 fL (ref 81.6–98.0)
MONO%: 7.7 % (ref 0.0–13.0)
NEUT#: 9.9 10*3/uL — ABNORMAL HIGH (ref 1.5–6.5)
RBC: 3.66 10*6/uL — ABNORMAL LOW (ref 4.20–5.71)
RDW: 19 % — ABNORMAL HIGH (ref 11.2–14.6)
RETIC #: 121.9 10*3/uL — ABNORMAL HIGH (ref 31.8–103.9)
Retic %: 3.3 % — ABNORMAL HIGH (ref 0.7–2.3)
WBC: 13.3 10*3/uL — ABNORMAL HIGH (ref 4.0–10.0)

## 2007-01-01 LAB — COMPREHENSIVE METABOLIC PANEL
Albumin: 3.3 g/dL — ABNORMAL LOW (ref 3.5–5.2)
CO2: 24 mEq/L (ref 19–32)
Calcium: 9.1 mg/dL (ref 8.4–10.5)
Glucose, Bld: 106 mg/dL — ABNORMAL HIGH (ref 70–99)
Potassium: 5.1 mEq/L (ref 3.5–5.3)
Sodium: 129 mEq/L — ABNORMAL LOW (ref 135–145)
Total Protein: 6.1 g/dL (ref 6.0–8.3)

## 2007-01-03 LAB — IGG, IGA, IGM: IgA: 91 mg/dL (ref 68–378)

## 2007-01-03 LAB — KAPPA/LAMBDA LIGHT CHAINS
Kappa:Lambda Ratio: 5.9 — ABNORMAL HIGH (ref 0.26–1.65)
Lambda Free Lght Chn: 0.81 mg/dL (ref 0.57–2.63)

## 2007-01-04 LAB — CBC WITH DIFFERENTIAL/PLATELET
Eosinophils Absolute: 0 10*3/uL (ref 0.0–0.5)
MCV: 92.1 fL (ref 81.6–98.0)
MONO#: 1.1 10*3/uL — ABNORMAL HIGH (ref 0.1–0.9)
MONO%: 9.5 % (ref 0.0–13.0)
NEUT#: 7.7 10*3/uL — ABNORMAL HIGH (ref 1.5–6.5)
RBC: 3.95 10*6/uL — ABNORMAL LOW (ref 4.20–5.71)
RDW: 15.8 % — ABNORMAL HIGH (ref 11.2–14.6)
WBC: 11.1 10*3/uL — ABNORMAL HIGH (ref 4.0–10.0)
lymph#: 2.2 10*3/uL (ref 0.9–3.3)

## 2007-01-08 LAB — COMPREHENSIVE METABOLIC PANEL
Albumin: 3.5 g/dL (ref 3.5–5.2)
CO2: 21 mEq/L (ref 19–32)
Calcium: 8.9 mg/dL (ref 8.4–10.5)
Glucose, Bld: 104 mg/dL — ABNORMAL HIGH (ref 70–99)
Potassium: 4.6 mEq/L (ref 3.5–5.3)
Sodium: 131 mEq/L — ABNORMAL LOW (ref 135–145)
Total Bilirubin: 0.9 mg/dL (ref 0.3–1.2)
Total Protein: 5.6 g/dL — ABNORMAL LOW (ref 6.0–8.3)

## 2007-01-08 LAB — CBC WITH DIFFERENTIAL/PLATELET
Eosinophils Absolute: 0 10*3/uL (ref 0.0–0.5)
LYMPH%: 19.2 % (ref 14.0–48.0)
MONO#: 1.2 10*3/uL — ABNORMAL HIGH (ref 0.1–0.9)
NEUT#: 5.8 10*3/uL (ref 1.5–6.5)
Platelets: 405 10*3/uL — ABNORMAL HIGH (ref 145–400)
RBC: 3.99 10*6/uL — ABNORMAL LOW (ref 4.20–5.71)
WBC: 8.8 10*3/uL (ref 4.0–10.0)

## 2007-01-11 LAB — CBC WITH DIFFERENTIAL/PLATELET
Eosinophils Absolute: 0 10*3/uL (ref 0.0–0.5)
MONO#: 1.5 10*3/uL — ABNORMAL HIGH (ref 0.1–0.9)
NEUT#: 8.8 10*3/uL — ABNORMAL HIGH (ref 1.5–6.5)
RBC: 3.96 10*6/uL — ABNORMAL LOW (ref 4.20–5.71)
RDW: 14.5 % (ref 11.2–14.6)
WBC: 11.7 10*3/uL — ABNORMAL HIGH (ref 4.0–10.0)
lymph#: 1.3 10*3/uL (ref 0.9–3.3)

## 2007-01-14 LAB — COMPREHENSIVE METABOLIC PANEL
Albumin: 3.3 g/dL — ABNORMAL LOW (ref 3.5–5.2)
CO2: 21 mEq/L (ref 19–32)
Glucose, Bld: 95 mg/dL (ref 70–99)
Potassium: 4.4 mEq/L (ref 3.5–5.3)
Sodium: 130 mEq/L — ABNORMAL LOW (ref 135–145)
Total Protein: 5.4 g/dL — ABNORMAL LOW (ref 6.0–8.3)

## 2007-01-14 LAB — KAPPA/LAMBDA LIGHT CHAINS: Lambda Free Lght Chn: 0.59 mg/dL (ref 0.57–2.63)

## 2007-01-16 LAB — CBC WITH DIFFERENTIAL/PLATELET
Basophils Absolute: 0.1 10*3/uL (ref 0.0–0.1)
EOS%: 0.1 % (ref 0.0–7.0)
HCT: 34.2 % — ABNORMAL LOW (ref 38.7–49.9)
HGB: 11.7 g/dL — ABNORMAL LOW (ref 13.0–17.1)
LYMPH%: 13.5 % — ABNORMAL LOW (ref 14.0–48.0)
MCH: 30.8 pg (ref 28.0–33.4)
MCV: 89.7 fL (ref 81.6–98.0)
MONO%: 9.7 % (ref 0.0–13.0)
NEUT%: 75.9 % — ABNORMAL HIGH (ref 40.0–75.0)
Platelets: 143 10*3/uL — ABNORMAL LOW (ref 145–400)

## 2007-01-17 ENCOUNTER — Ambulatory Visit: Payer: Self-pay | Admitting: Oncology

## 2007-01-24 LAB — CBC WITH DIFFERENTIAL/PLATELET
EOS%: 0.3 % (ref 0.0–7.0)
LYMPH%: 21.5 % (ref 14.0–48.0)
MCH: 31.3 pg (ref 28.0–33.4)
MCV: 91.3 fL (ref 81.6–98.0)
MONO%: 11.3 % (ref 0.0–13.0)
Platelets: 493 10*3/uL — ABNORMAL HIGH (ref 145–400)
RBC: 3.67 10*6/uL — ABNORMAL LOW (ref 4.20–5.71)
RDW: 17.7 % — ABNORMAL HIGH (ref 11.2–14.6)

## 2007-01-28 ENCOUNTER — Ambulatory Visit (HOSPITAL_COMMUNITY): Admission: RE | Admit: 2007-01-28 | Discharge: 2007-01-28 | Payer: Self-pay | Admitting: Oncology

## 2007-01-28 LAB — CBC WITH DIFFERENTIAL/PLATELET
BASO%: 0.4 % (ref 0.0–2.0)
Eosinophils Absolute: 0 10*3/uL (ref 0.0–0.5)
MCHC: 35.1 g/dL (ref 32.0–35.9)
MCV: 89.3 fL (ref 81.6–98.0)
MONO%: 7.5 % (ref 0.0–13.0)
NEUT#: 7 10*3/uL — ABNORMAL HIGH (ref 1.5–6.5)
RBC: 3.98 10*6/uL — ABNORMAL LOW (ref 4.20–5.71)
RDW: 13.5 % (ref 11.2–14.6)
WBC: 9.1 10*3/uL (ref 4.0–10.0)

## 2007-01-29 LAB — CBC WITH DIFFERENTIAL/PLATELET
BASO%: 0.4 % (ref 0.0–2.0)
LYMPH%: 16.7 % (ref 14.0–48.0)
MCHC: 34.4 g/dL (ref 32.0–35.9)
MCV: 91.8 fL (ref 81.6–98.0)
MONO%: 9.7 % (ref 0.0–13.0)
Platelets: 350 10*3/uL (ref 145–400)
RBC: 3.78 10*6/uL — ABNORMAL LOW (ref 4.20–5.71)

## 2007-01-31 LAB — CBC WITH DIFFERENTIAL/PLATELET
BASO%: 0.8 % (ref 0.0–2.0)
LYMPH%: 13.6 % — ABNORMAL LOW (ref 14.0–48.0)
MCHC: 35.6 g/dL (ref 32.0–35.9)
MONO#: 1.4 10*3/uL — ABNORMAL HIGH (ref 0.1–0.9)
RBC: 3.91 10*6/uL — ABNORMAL LOW (ref 4.20–5.71)
WBC: 9.6 10*3/uL (ref 4.0–10.0)
lymph#: 1.3 10*3/uL (ref 0.9–3.3)

## 2007-02-04 LAB — CBC WITH DIFFERENTIAL/PLATELET
Basophils Absolute: 0.2 10*3/uL — ABNORMAL HIGH (ref 0.0–0.1)
EOS%: 0 % (ref 0.0–7.0)
Eosinophils Absolute: 0 10*3/uL (ref 0.0–0.5)
HCT: 37 % — ABNORMAL LOW (ref 38.7–49.9)
HGB: 13 g/dL (ref 13.0–17.1)
LYMPH%: 10.7 % — ABNORMAL LOW (ref 14.0–48.0)
MCH: 31.1 pg (ref 28.0–33.4)
MCV: 88.5 fL (ref 81.6–98.0)
MONO%: 6.3 % (ref 0.0–13.0)
RBC: 4.18 10*6/uL — ABNORMAL LOW (ref 4.20–5.71)
WBC: 11.6 10*3/uL — ABNORMAL HIGH (ref 4.0–10.0)

## 2007-02-12 LAB — CBC WITH DIFFERENTIAL/PLATELET
BASO%: 0.8 % (ref 0.0–2.0)
EOS%: 0.2 % (ref 0.0–7.0)
LYMPH%: 8.7 % — ABNORMAL LOW (ref 14.0–48.0)
MCHC: 34.7 g/dL (ref 32.0–35.9)
MCV: 92.2 fL (ref 81.6–98.0)
MONO%: 9.9 % (ref 0.0–13.0)
Platelets: 157 10*3/uL (ref 145–400)
RBC: 3.85 10*6/uL — ABNORMAL LOW (ref 4.20–5.71)
RDW: 14.8 % — ABNORMAL HIGH (ref 11.2–14.6)

## 2007-02-18 LAB — CBC WITH DIFFERENTIAL/PLATELET
BASO%: 1.4 % (ref 0.0–2.0)
LYMPH%: 11.3 % — ABNORMAL LOW (ref 14.0–48.0)
MCHC: 34.6 g/dL (ref 32.0–35.9)
MONO#: 0.5 10*3/uL (ref 0.1–0.9)
RBC: 4.1 10*6/uL — ABNORMAL LOW (ref 4.20–5.71)
RDW: 17.9 % — ABNORMAL HIGH (ref 11.2–14.6)
WBC: 8.7 10*3/uL (ref 4.0–10.0)
lymph#: 1 10*3/uL (ref 0.9–3.3)

## 2007-02-21 LAB — COMPREHENSIVE METABOLIC PANEL
ALT: 20 U/L (ref 0–53)
CO2: 24 mEq/L (ref 19–32)
Chloride: 94 mEq/L — ABNORMAL LOW (ref 96–112)
Potassium: 4.1 mEq/L (ref 3.5–5.3)
Sodium: 127 mEq/L — ABNORMAL LOW (ref 135–145)
Total Bilirubin: 0.6 mg/dL (ref 0.3–1.2)
Total Protein: 5.7 g/dL — ABNORMAL LOW (ref 6.0–8.3)

## 2007-02-21 LAB — IMMUNOFIXATION ELECTROPHORESIS
IgA: 81 mg/dL (ref 68–378)
IgG (Immunoglobin G), Serum: 633 mg/dL — ABNORMAL LOW (ref 694–1618)

## 2007-02-21 LAB — KAPPA/LAMBDA LIGHT CHAINS: Kappa free light chain: 3.77 mg/dL — ABNORMAL HIGH (ref 0.33–1.94)

## 2007-02-25 LAB — CBC WITH DIFFERENTIAL/PLATELET
Basophils Absolute: 0.1 10*3/uL (ref 0.0–0.1)
Eosinophils Absolute: 0 10*3/uL (ref 0.0–0.5)
HCT: 38.4 % — ABNORMAL LOW (ref 38.7–49.9)
HGB: 13.7 g/dL (ref 13.0–17.1)
LYMPH%: 17.2 % (ref 14.0–48.0)
MCV: 89.6 fL (ref 81.6–98.0)
MONO%: 10.8 % (ref 0.0–13.0)
NEUT#: 5.7 10*3/uL (ref 1.5–6.5)
NEUT%: 69.9 % (ref 40.0–75.0)
Platelets: 273 10*3/uL (ref 145–400)

## 2007-03-07 ENCOUNTER — Ambulatory Visit: Payer: Self-pay | Admitting: Oncology

## 2007-03-12 LAB — CBC & DIFF AND RETIC
BASO%: 0.9 % (ref 0.0–2.0)
LYMPH%: 15.4 % (ref 14.0–48.0)
MCHC: 34.6 g/dL (ref 32.0–35.9)
MCV: 93.4 fL (ref 81.6–98.0)
MONO%: 11.2 % (ref 0.0–13.0)
NEUT%: 71.5 % (ref 40.0–75.0)
Platelets: 218 10*3/uL (ref 145–400)
RBC: 4.85 10*6/uL (ref 4.20–5.71)
Retic %: 0.9 % (ref 0.7–2.3)

## 2007-03-15 LAB — IGG, IGA, IGM
IgA: 131 mg/dL (ref 68–378)
IgG (Immunoglobin G), Serum: 780 mg/dL (ref 694–1618)
IgM, Serum: 48 mg/dL — ABNORMAL LOW (ref 60–263)

## 2007-03-15 LAB — KAPPA/LAMBDA LIGHT CHAINS: Kappa:Lambda Ratio: 5.54 — ABNORMAL HIGH (ref 0.26–1.65)

## 2007-03-15 LAB — COMPREHENSIVE METABOLIC PANEL
Albumin: 3.2 g/dL — ABNORMAL LOW (ref 3.5–5.2)
Alkaline Phosphatase: 47 U/L (ref 39–117)
BUN: 18 mg/dL (ref 6–23)
CO2: 24 mEq/L (ref 19–32)
Glucose, Bld: 91 mg/dL (ref 70–99)
Potassium: 3.7 mEq/L (ref 3.5–5.3)
Total Bilirubin: 0.9 mg/dL (ref 0.3–1.2)

## 2007-04-10 ENCOUNTER — Inpatient Hospital Stay (HOSPITAL_COMMUNITY): Admission: RE | Admit: 2007-04-10 | Discharge: 2007-04-18 | Payer: Self-pay | Admitting: Urology

## 2007-04-10 ENCOUNTER — Encounter (INDEPENDENT_AMBULATORY_CARE_PROVIDER_SITE_OTHER): Payer: Self-pay | Admitting: *Deleted

## 2007-04-30 ENCOUNTER — Ambulatory Visit: Payer: Self-pay | Admitting: Oncology

## 2007-04-30 LAB — CBC WITH DIFFERENTIAL/PLATELET
BASO%: 0.3 % (ref 0.0–2.0)
LYMPH%: 18.9 % (ref 14.0–48.0)
MCHC: 35.8 g/dL (ref 32.0–35.9)
MONO#: 0.6 10*3/uL (ref 0.1–0.9)
Platelets: 413 10*3/uL — ABNORMAL HIGH (ref 145–400)
RBC: 2.99 10*6/uL — ABNORMAL LOW (ref 4.20–5.71)
RDW: 14.9 % — ABNORMAL HIGH (ref 11.2–14.6)
WBC: 8.4 10*3/uL (ref 4.0–10.0)

## 2007-04-30 LAB — RETICULOCYTES: Retic %: 1 % (ref 0.7–2.3)

## 2007-05-02 LAB — COMPREHENSIVE METABOLIC PANEL
ALT: 11 U/L (ref 0–53)
Alkaline Phosphatase: 38 U/L — ABNORMAL LOW (ref 39–117)
CO2: 20 mEq/L (ref 19–32)
Sodium: 139 mEq/L (ref 135–145)
Total Bilirubin: 0.5 mg/dL (ref 0.3–1.2)
Total Protein: 5.6 g/dL — ABNORMAL LOW (ref 6.0–8.3)

## 2007-05-02 LAB — KAPPA/LAMBDA LIGHT CHAINS: Kappa free light chain: 4.27 mg/dL — ABNORMAL HIGH (ref 0.33–1.94)

## 2007-05-02 LAB — FERRITIN: Ferritin: 840 ng/mL — ABNORMAL HIGH (ref 22–322)

## 2007-05-14 LAB — CBC & DIFF AND RETIC
Basophils Absolute: 0 10*3/uL (ref 0.0–0.1)
Eosinophils Absolute: 0 10*3/uL (ref 0.0–0.5)
HCT: 39.3 % (ref 38.7–49.9)
HGB: 13.7 g/dL (ref 13.0–17.1)
LYMPH%: 11.8 % — ABNORMAL LOW (ref 14.0–48.0)
MCV: 96.9 fL (ref 81.6–98.0)
MONO%: 2.1 % (ref 0.0–13.0)
NEUT#: 8.8 10*3/uL — ABNORMAL HIGH (ref 1.5–6.5)
NEUT%: 85.6 % — ABNORMAL HIGH (ref 40.0–75.0)
Platelets: 272 10*3/uL (ref 145–400)
RBC: 4.05 10*6/uL — ABNORMAL LOW (ref 4.20–5.71)

## 2007-05-17 LAB — CBC WITH DIFFERENTIAL/PLATELET
Eosinophils Absolute: 0 10*3/uL (ref 0.0–0.5)
HCT: 37 % — ABNORMAL LOW (ref 38.7–49.9)
HGB: 13.3 g/dL (ref 13.0–17.1)
LYMPH%: 11.1 % — ABNORMAL LOW (ref 14.0–48.0)
MONO#: 0.7 10*3/uL (ref 0.1–0.9)
NEUT#: 10.4 10*3/uL — ABNORMAL HIGH (ref 1.5–6.5)
NEUT%: 82.6 % — ABNORMAL HIGH (ref 40.0–75.0)
Platelets: 209 10*3/uL (ref 145–400)
WBC: 12.6 10*3/uL — ABNORMAL HIGH (ref 4.0–10.0)
lymph#: 1.4 10*3/uL (ref 0.9–3.3)

## 2007-05-23 LAB — COMPREHENSIVE METABOLIC PANEL
ALT: 24 U/L (ref 0–53)
AST: 21 U/L (ref 0–37)
Albumin: 2.6 g/dL — ABNORMAL LOW (ref 3.5–5.2)
Alkaline Phosphatase: 35 U/L — ABNORMAL LOW (ref 39–117)
BUN: 24 mg/dL — ABNORMAL HIGH (ref 6–23)
CO2: 21 mEq/L (ref 19–32)
Calcium: 7.9 mg/dL — ABNORMAL LOW (ref 8.4–10.5)
Chloride: 111 mEq/L (ref 96–112)
Creatinine, Ser: 0.78 mg/dL (ref 0.40–1.50)
Glucose, Bld: 127 mg/dL — ABNORMAL HIGH (ref 70–99)
Potassium: 3.5 mEq/L (ref 3.5–5.3)
Sodium: 136 mEq/L (ref 135–145)
Total Bilirubin: 1.1 mg/dL (ref 0.3–1.2)
Total Protein: 4.4 g/dL — ABNORMAL LOW (ref 6.0–8.3)

## 2007-05-23 LAB — CBC WITH DIFFERENTIAL/PLATELET
BASO%: 1 % (ref 0.0–2.0)
Basophils Absolute: 0.1 10*3/uL (ref 0.0–0.1)
EOS%: 0.2 % (ref 0.0–7.0)
Eosinophils Absolute: 0 10*3/uL (ref 0.0–0.5)
HCT: 34.7 % — ABNORMAL LOW (ref 38.7–49.9)
HGB: 12.6 g/dL — ABNORMAL LOW (ref 13.0–17.1)
LYMPH%: 12.9 % — ABNORMAL LOW (ref 14.0–48.0)
MCH: 33.6 pg — ABNORMAL HIGH (ref 28.0–33.4)
MCHC: 36.4 g/dL — ABNORMAL HIGH (ref 32.0–35.9)
MCV: 92.3 fL (ref 81.6–98.0)
MONO#: 0.8 10*3/uL (ref 0.1–0.9)
MONO%: 15.4 % — ABNORMAL HIGH (ref 0.0–13.0)
NEUT#: 3.9 10*3/uL (ref 1.5–6.5)
NEUT%: 70.5 % (ref 40.0–75.0)
Platelets: 183 10*3/uL (ref 145–400)
RBC: 3.76 10*6/uL — ABNORMAL LOW (ref 4.20–5.71)
RDW: 13.1 % (ref 11.2–14.6)
WBC: 5.5 10*3/uL (ref 4.0–10.0)
lymph#: 0.7 10*3/uL — ABNORMAL LOW (ref 0.9–3.3)

## 2007-06-03 LAB — COMPREHENSIVE METABOLIC PANEL
ALT: 15 U/L (ref 0–53)
AST: 20 U/L (ref 0–37)
Calcium: 8.6 mg/dL (ref 8.4–10.5)
Chloride: 105 mEq/L (ref 96–112)
Creatinine, Ser: 0.83 mg/dL (ref 0.40–1.50)
Total Bilirubin: 0.6 mg/dL (ref 0.3–1.2)

## 2007-06-03 LAB — CBC WITH DIFFERENTIAL/PLATELET
Basophils Absolute: 0.1 10*3/uL (ref 0.0–0.1)
Eosinophils Absolute: 0 10*3/uL (ref 0.0–0.5)
HGB: 11.9 g/dL — ABNORMAL LOW (ref 13.0–17.1)
LYMPH%: 8.2 % — ABNORMAL LOW (ref 14.0–48.0)
MCV: 94.7 fL (ref 81.6–98.0)
MONO%: 6.4 % (ref 0.0–13.0)
NEUT#: 8.8 10*3/uL — ABNORMAL HIGH (ref 1.5–6.5)
Platelets: 348 10*3/uL (ref 145–400)
RDW: 15.4 % — ABNORMAL HIGH (ref 11.2–14.6)

## 2007-06-04 LAB — KAPPA/LAMBDA LIGHT CHAINS
Kappa:Lambda Ratio: 3.03 — ABNORMAL HIGH (ref 0.26–1.65)
Lambda Free Lght Chn: 1.27 mg/dL (ref 0.57–2.63)

## 2007-06-04 LAB — IGG, IGA, IGM: IgA: 202 mg/dL (ref 68–378)

## 2007-06-11 LAB — CBC WITH DIFFERENTIAL/PLATELET
BASO%: 0.3 % (ref 0.0–2.0)
EOS%: 0.1 % (ref 0.0–7.0)
HCT: 33.7 % — ABNORMAL LOW (ref 38.7–49.9)
LYMPH%: 9.9 % — ABNORMAL LOW (ref 14.0–48.0)
MCH: 32.9 pg (ref 28.0–33.4)
MCHC: 35.3 g/dL (ref 32.0–35.9)
MCV: 93.1 fL (ref 81.6–98.0)
MONO%: 5 % (ref 0.0–13.0)
NEUT%: 84.6 % — ABNORMAL HIGH (ref 40.0–75.0)
Platelets: 313 10*3/uL (ref 145–400)

## 2007-06-11 LAB — COMPREHENSIVE METABOLIC PANEL
ALT: 25 U/L (ref 0–53)
AST: 24 U/L (ref 0–37)
Creatinine, Ser: 0.71 mg/dL (ref 0.40–1.50)
Total Bilirubin: 1 mg/dL (ref 0.3–1.2)

## 2007-06-14 LAB — CBC WITH DIFFERENTIAL/PLATELET
Basophils Absolute: 0.1 10*3/uL (ref 0.0–0.1)
HCT: 33.9 % — ABNORMAL LOW (ref 38.7–49.9)
MCH: 33.1 pg (ref 28.0–33.4)
MONO#: 0.9 10*3/uL (ref 0.1–0.9)
NEUT#: 12.3 10*3/uL — ABNORMAL HIGH (ref 1.5–6.5)
NEUT%: 81.3 % — ABNORMAL HIGH (ref 40.0–75.0)
Platelets: 237 10*3/uL (ref 145–400)
lymph#: 1.8 10*3/uL (ref 0.9–3.3)

## 2007-06-18 ENCOUNTER — Ambulatory Visit: Payer: Self-pay | Admitting: Oncology

## 2007-06-18 LAB — CBC WITH DIFFERENTIAL/PLATELET
BASO%: 0.3 % (ref 0.0–2.0)
Basophils Absolute: 0 10*3/uL (ref 0.0–0.1)
Eosinophils Absolute: 0 10*3/uL (ref 0.0–0.5)
HCT: 33.4 % — ABNORMAL LOW (ref 38.7–49.9)
HGB: 11.8 g/dL — ABNORMAL LOW (ref 13.0–17.1)
MCHC: 35.4 g/dL (ref 32.0–35.9)
MONO#: 0.7 10*3/uL (ref 0.1–0.9)
NEUT#: 8.5 10*3/uL — ABNORMAL HIGH (ref 1.5–6.5)
NEUT%: 79.7 % — ABNORMAL HIGH (ref 40.0–75.0)
WBC: 10.7 10*3/uL — ABNORMAL HIGH (ref 4.0–10.0)
lymph#: 1.4 10*3/uL (ref 0.9–3.3)

## 2007-06-18 LAB — COMPREHENSIVE METABOLIC PANEL
BUN: 16 mg/dL (ref 6–23)
CO2: 20 mEq/L (ref 19–32)
Calcium: 8.4 mg/dL (ref 8.4–10.5)
Chloride: 110 mEq/L (ref 96–112)
Creatinine, Ser: 0.78 mg/dL (ref 0.40–1.50)
Glucose, Bld: 86 mg/dL (ref 70–99)

## 2007-06-21 LAB — COMPREHENSIVE METABOLIC PANEL
AST: 31 U/L (ref 0–37)
BUN: 12 mg/dL (ref 6–23)
CO2: 21 mEq/L (ref 19–32)
Calcium: 7.2 mg/dL — ABNORMAL LOW (ref 8.4–10.5)
Chloride: 109 mEq/L (ref 96–112)
Creatinine, Ser: 0.6 mg/dL (ref 0.40–1.50)

## 2007-06-21 LAB — CBC WITH DIFFERENTIAL/PLATELET
Basophils Absolute: 0 10*3/uL (ref 0.0–0.1)
EOS%: 0.2 % (ref 0.0–7.0)
HCT: 32.2 % — ABNORMAL LOW (ref 38.7–49.9)
HGB: 11.4 g/dL — ABNORMAL LOW (ref 13.0–17.1)
MCH: 32 pg (ref 28.0–33.4)
NEUT%: 84.2 % — ABNORMAL HIGH (ref 40.0–75.0)
lymph#: 1.3 10*3/uL (ref 0.9–3.3)

## 2007-06-24 ENCOUNTER — Encounter: Admission: RE | Admit: 2007-06-24 | Discharge: 2007-06-24 | Payer: Self-pay | Admitting: Cardiology

## 2007-06-24 ENCOUNTER — Inpatient Hospital Stay (HOSPITAL_COMMUNITY): Admission: EM | Admit: 2007-06-24 | Discharge: 2007-06-29 | Payer: Self-pay | Admitting: Emergency Medicine

## 2007-07-02 LAB — CBC WITH DIFFERENTIAL/PLATELET
Basophils Absolute: 0.1 10*3/uL (ref 0.0–0.1)
Eosinophils Absolute: 0.1 10*3/uL (ref 0.0–0.5)
HCT: 32.3 % — ABNORMAL LOW (ref 38.7–49.9)
HGB: 11.5 g/dL — ABNORMAL LOW (ref 13.0–17.1)
MONO#: 0.5 10*3/uL (ref 0.1–0.9)
NEUT#: 7.6 10*3/uL — ABNORMAL HIGH (ref 1.5–6.5)
NEUT%: 81.9 % — ABNORMAL HIGH (ref 40.0–75.0)
WBC: 9.3 10*3/uL (ref 4.0–10.0)
lymph#: 1 10*3/uL (ref 0.9–3.3)

## 2007-07-02 LAB — PROTIME-INR
INR: 1.7 — ABNORMAL LOW (ref 2.00–3.50)
Protime: 20.4 Seconds — ABNORMAL HIGH (ref 10.6–13.4)

## 2007-08-05 ENCOUNTER — Ambulatory Visit: Payer: Self-pay | Admitting: Oncology

## 2007-08-05 LAB — CBC WITH DIFFERENTIAL/PLATELET
BASO%: 1.3 % (ref 0.0–2.0)
EOS%: 0 % (ref 0.0–7.0)
LYMPH%: 13.5 % — ABNORMAL LOW (ref 14.0–48.0)
MCHC: 35 g/dL (ref 32.0–35.9)
MCV: 93.5 fL (ref 81.6–98.0)
MONO%: 8.9 % (ref 0.0–13.0)
Platelets: 305 10*3/uL (ref 145–400)
RBC: 4.26 10*6/uL (ref 4.20–5.71)

## 2007-08-06 LAB — KAPPA/LAMBDA LIGHT CHAINS: Kappa free light chain: 1.51 mg/dL (ref 0.33–1.94)

## 2007-08-06 LAB — COMPREHENSIVE METABOLIC PANEL
ALT: 37 U/L (ref 0–53)
AST: 27 U/L (ref 0–37)
Alkaline Phosphatase: 51 U/L (ref 39–117)
Sodium: 141 mEq/L (ref 135–145)
Total Bilirubin: 0.6 mg/dL (ref 0.3–1.2)
Total Protein: 5.3 g/dL — ABNORMAL LOW (ref 6.0–8.3)

## 2007-08-06 LAB — IGG, IGA, IGM: IgA: 101 mg/dL (ref 68–378)

## 2007-09-16 ENCOUNTER — Ambulatory Visit: Payer: Self-pay | Admitting: Oncology

## 2007-09-19 LAB — COMPREHENSIVE METABOLIC PANEL
Albumin: 3.5 g/dL (ref 3.5–5.2)
Alkaline Phosphatase: 39 U/L (ref 39–117)
BUN: 22 mg/dL (ref 6–23)
CO2: 26 mEq/L (ref 19–32)
Calcium: 8.5 mg/dL (ref 8.4–10.5)
Chloride: 107 mEq/L (ref 96–112)
Glucose, Bld: 109 mg/dL — ABNORMAL HIGH (ref 70–99)
Potassium: 4 mEq/L (ref 3.5–5.3)
Sodium: 142 mEq/L (ref 135–145)
Total Protein: 5.4 g/dL — ABNORMAL LOW (ref 6.0–8.3)

## 2007-09-19 LAB — KAPPA/LAMBDA LIGHT CHAINS
Kappa:Lambda Ratio: 1.97 — ABNORMAL HIGH (ref 0.26–1.65)
Lambda Free Lght Chn: 1.02 mg/dL (ref 0.57–2.63)

## 2007-09-19 LAB — IGG, IGA, IGM: IgA: 109 mg/dL (ref 68–378)

## 2007-11-01 ENCOUNTER — Ambulatory Visit: Payer: Self-pay | Admitting: Oncology

## 2007-11-05 LAB — COMPREHENSIVE METABOLIC PANEL
ALT: 24 U/L (ref 0–53)
BUN: 18 mg/dL (ref 6–23)
CO2: 27 mEq/L (ref 19–32)
Calcium: 8.8 mg/dL (ref 8.4–10.5)
Chloride: 108 mEq/L (ref 96–112)
Creatinine, Ser: 0.91 mg/dL (ref 0.40–1.50)

## 2007-11-05 LAB — CBC WITH DIFFERENTIAL/PLATELET
Basophils Absolute: 0.1 10*3/uL (ref 0.0–0.1)
Eosinophils Absolute: 0.1 10*3/uL (ref 0.0–0.5)
HCT: 34.7 % — ABNORMAL LOW (ref 38.7–49.9)
HGB: 12.2 g/dL — ABNORMAL LOW (ref 13.0–17.1)
MCV: 90.3 fL (ref 81.6–98.0)
NEUT#: 9.5 10*3/uL — ABNORMAL HIGH (ref 1.5–6.5)
NEUT%: 80.8 % — ABNORMAL HIGH (ref 40.0–75.0)
RDW: 11.2 % (ref 11.2–14.6)
lymph#: 1.2 10*3/uL (ref 0.9–3.3)

## 2007-11-05 LAB — MORPHOLOGY: PLT EST: ADEQUATE

## 2007-11-07 LAB — KAPPA/LAMBDA LIGHT CHAINS: Kappa:Lambda Ratio: 1.13 (ref 0.26–1.65)

## 2007-11-07 LAB — IMMUNOFIXATION ELECTROPHORESIS
IgM, Serum: 48 mg/dL — ABNORMAL LOW (ref 60–263)
Total Protein, Serum Electrophoresis: 6.3 g/dL (ref 6.0–8.3)

## 2007-12-12 ENCOUNTER — Ambulatory Visit: Payer: Self-pay | Admitting: Oncology

## 2007-12-17 LAB — CBC WITH DIFFERENTIAL/PLATELET
Basophils Absolute: 0.2 10*3/uL — ABNORMAL HIGH (ref 0.0–0.1)
Eosinophils Absolute: 0 10*3/uL (ref 0.0–0.5)
HGB: 14.6 g/dL (ref 13.0–17.1)
MCV: 88.4 fL (ref 81.6–98.0)
MONO#: 1 10*3/uL — ABNORMAL HIGH (ref 0.1–0.9)
MONO%: 6.8 % (ref 0.0–13.0)
NEUT#: 11.7 10*3/uL — ABNORMAL HIGH (ref 1.5–6.5)
Platelets: 248 10*3/uL (ref 145–400)
RDW: 11.4 % (ref 11.2–14.6)

## 2007-12-17 LAB — COMPREHENSIVE METABOLIC PANEL
Albumin: 3.8 g/dL (ref 3.5–5.2)
Alkaline Phosphatase: 61 U/L (ref 39–117)
BUN: 26 mg/dL — ABNORMAL HIGH (ref 6–23)
CO2: 27 mEq/L (ref 19–32)
Calcium: 8.9 mg/dL (ref 8.4–10.5)
Glucose, Bld: 109 mg/dL — ABNORMAL HIGH (ref 70–99)
Potassium: 4.3 mEq/L (ref 3.5–5.3)

## 2007-12-20 LAB — VISCOSITY, SERUM: Viscosity, Serum: 1.1 mPa.S (ref 1.1–2.0)

## 2007-12-20 LAB — IMMUNOFIXATION ELECTROPHORESIS
IgA: 93 mg/dL (ref 68–378)
IgG (Immunoglobin G), Serum: 610 mg/dL — ABNORMAL LOW (ref 694–1618)
Total Protein, Serum Electrophoresis: 5.8 g/dL — ABNORMAL LOW (ref 6.0–8.3)

## 2007-12-20 LAB — KAPPA/LAMBDA LIGHT CHAINS: Kappa:Lambda Ratio: 0.96 (ref 0.26–1.65)

## 2008-01-19 DIAGNOSIS — Z8619 Personal history of other infectious and parasitic diseases: Secondary | ICD-10-CM

## 2008-01-19 HISTORY — DX: Personal history of other infectious and parasitic diseases: Z86.19

## 2008-01-24 ENCOUNTER — Inpatient Hospital Stay (HOSPITAL_COMMUNITY): Admission: AD | Admit: 2008-01-24 | Discharge: 2008-01-28 | Payer: Self-pay | Admitting: Oncology

## 2008-01-24 ENCOUNTER — Ambulatory Visit: Payer: Self-pay | Admitting: Infectious Diseases

## 2008-01-24 ENCOUNTER — Ambulatory Visit: Payer: Self-pay | Admitting: Oncology

## 2008-03-20 ENCOUNTER — Ambulatory Visit: Payer: Self-pay | Admitting: Oncology

## 2008-04-28 LAB — CBC WITH DIFFERENTIAL/PLATELET
Basophils Absolute: 0 10*3/uL (ref 0.0–0.1)
EOS%: 1.9 % (ref 0.0–7.0)
MCH: 31.1 pg (ref 28.0–33.4)
MCHC: 34.1 g/dL (ref 32.0–35.9)
MCV: 91.2 fL (ref 81.6–98.0)
MONO%: 8.7 % (ref 0.0–13.0)
RBC: 4.06 10*6/uL — ABNORMAL LOW (ref 4.20–5.71)
RDW: 13.2 % (ref 11.2–14.6)

## 2008-04-30 LAB — BETA 2 MICROGLOBULIN, SERUM: Beta-2 Microglobulin: 2.8 mg/L — ABNORMAL HIGH (ref 1.01–1.73)

## 2008-04-30 LAB — COMPREHENSIVE METABOLIC PANEL
AST: 19 U/L (ref 0–37)
Albumin: 3.7 g/dL (ref 3.5–5.2)
Alkaline Phosphatase: 43 U/L (ref 39–117)
BUN: 21 mg/dL (ref 6–23)
Potassium: 4.4 mEq/L (ref 3.5–5.3)
Sodium: 141 mEq/L (ref 135–145)

## 2008-04-30 LAB — KAPPA/LAMBDA LIGHT CHAINS
Kappa free light chain: 2.45 mg/dL — ABNORMAL HIGH (ref 0.33–1.94)
Kappa:Lambda Ratio: 1.48 (ref 0.26–1.65)

## 2008-04-30 LAB — IMMUNOFIXATION ELECTROPHORESIS: IgA: 151 mg/dL (ref 68–378)

## 2008-05-05 ENCOUNTER — Ambulatory Visit: Payer: Self-pay | Admitting: Oncology

## 2008-07-22 ENCOUNTER — Ambulatory Visit: Payer: Self-pay | Admitting: Oncology

## 2008-07-24 LAB — CBC WITH DIFFERENTIAL/PLATELET
BASO%: 0.4 % (ref 0.0–2.0)
EOS%: 0.6 % (ref 0.0–7.0)
Eosinophils Absolute: 0 10*3/uL (ref 0.0–0.5)
LYMPH%: 33.3 % (ref 14.0–48.0)
MCH: 31.9 pg (ref 28.0–33.4)
MCHC: 34.5 g/dL (ref 32.0–35.9)
MCV: 92.5 fL (ref 81.6–98.0)
MONO%: 8.2 % (ref 0.0–13.0)
NEUT#: 3.1 10*3/uL (ref 1.5–6.5)
Platelets: 183 10*3/uL (ref 145–400)
RBC: 3.88 10*6/uL — ABNORMAL LOW (ref 4.20–5.71)
RDW: 13.4 % (ref 11.2–14.6)

## 2008-07-29 LAB — BETA 2 MICROGLOBULIN, SERUM: Beta-2 Microglobulin: 2.2 mg/L — ABNORMAL HIGH (ref 1.01–1.73)

## 2008-07-29 LAB — COMPREHENSIVE METABOLIC PANEL
AST: 22 U/L (ref 0–37)
Albumin: 3.7 g/dL (ref 3.5–5.2)
Alkaline Phosphatase: 48 U/L (ref 39–117)
Glucose, Bld: 94 mg/dL (ref 70–99)
Potassium: 4.6 mEq/L (ref 3.5–5.3)
Sodium: 140 mEq/L (ref 135–145)
Total Bilirubin: 0.5 mg/dL (ref 0.3–1.2)
Total Protein: 6 g/dL (ref 6.0–8.3)

## 2008-07-29 LAB — IMMUNOFIXATION ELECTROPHORESIS: IgG (Immunoglobin G), Serum: 966 mg/dL (ref 694–1618)

## 2008-07-29 LAB — KAPPA/LAMBDA LIGHT CHAINS
Kappa free light chain: 2.67 mg/dL — ABNORMAL HIGH (ref 0.33–1.94)
Lambda Free Lght Chn: 0.75 mg/dL (ref 0.57–2.63)

## 2008-10-14 ENCOUNTER — Ambulatory Visit: Payer: Self-pay | Admitting: Oncology

## 2008-10-20 LAB — CBC WITH DIFFERENTIAL/PLATELET
BASO%: 0.7 % (ref 0.0–2.0)
HCT: 40.2 % (ref 38.7–49.9)
MCHC: 34.2 g/dL (ref 32.0–35.9)
MONO#: 0.5 10*3/uL (ref 0.1–0.9)
RBC: 4.3 10*6/uL (ref 4.20–5.71)
RDW: 13.1 % (ref 11.2–14.6)
WBC: 6.4 10*3/uL (ref 4.0–10.0)
lymph#: 2.5 10*3/uL (ref 0.9–3.3)

## 2008-10-21 LAB — IGG, IGA, IGM
IgA: 211 mg/dL (ref 68–378)
IgG (Immunoglobin G), Serum: 1020 mg/dL (ref 694–1618)
IgM, Serum: 65 mg/dL (ref 60–263)

## 2008-10-21 LAB — KAPPA/LAMBDA LIGHT CHAINS
Kappa free light chain: 4.2 mg/dL — ABNORMAL HIGH (ref 0.33–1.94)
Lambda Free Lght Chn: 1.04 mg/dL (ref 0.57–2.63)

## 2008-10-21 LAB — COMPREHENSIVE METABOLIC PANEL
ALT: 16 U/L (ref 0–53)
CO2: 25 mEq/L (ref 19–32)
Calcium: 9.3 mg/dL (ref 8.4–10.5)
Chloride: 105 mEq/L (ref 96–112)
Potassium: 4.6 mEq/L (ref 3.5–5.3)
Sodium: 138 mEq/L (ref 135–145)
Total Protein: 6.9 g/dL (ref 6.0–8.3)

## 2008-10-21 LAB — BETA 2 MICROGLOBULIN, SERUM: Beta-2 Microglobulin: 2.75 mg/L — ABNORMAL HIGH (ref 1.01–1.73)

## 2009-01-15 ENCOUNTER — Ambulatory Visit: Payer: Self-pay | Admitting: Oncology

## 2009-01-19 LAB — CBC WITH DIFFERENTIAL/PLATELET
BASO%: 0.6 % (ref 0.0–2.0)
EOS%: 4.5 % (ref 0.0–7.0)
HCT: 39.5 % (ref 38.4–49.9)
LYMPH%: 32.1 % (ref 14.0–49.0)
MCH: 32.2 pg (ref 27.2–33.4)
MCHC: 34.1 g/dL (ref 32.0–36.0)
MCV: 94.3 fL (ref 79.3–98.0)
MONO%: 7.5 % (ref 0.0–14.0)
NEUT%: 55.3 % (ref 39.0–75.0)
Platelets: 192 10*3/uL (ref 140–400)

## 2009-01-21 LAB — COMPREHENSIVE METABOLIC PANEL
ALT: 17 U/L (ref 0–53)
AST: 21 U/L (ref 0–37)
BUN: 32 mg/dL — ABNORMAL HIGH (ref 6–23)
Creatinine, Ser: 1.35 mg/dL (ref 0.40–1.50)
Total Bilirubin: 0.7 mg/dL (ref 0.3–1.2)

## 2009-01-21 LAB — IMMUNOFIXATION ELECTROPHORESIS
IgA: 254 mg/dL (ref 68–378)
IgG (Immunoglobin G), Serum: 1160 mg/dL (ref 694–1618)

## 2009-01-21 LAB — KAPPA/LAMBDA LIGHT CHAINS: Kappa:Lambda Ratio: 2.1 — ABNORMAL HIGH (ref 0.26–1.65)

## 2009-01-28 LAB — BASIC METABOLIC PANEL
BUN: 34 mg/dL — ABNORMAL HIGH (ref 6–23)
Calcium: 9.2 mg/dL (ref 8.4–10.5)
Glucose, Bld: 98 mg/dL (ref 70–99)
Sodium: 137 mEq/L (ref 135–145)

## 2009-06-03 ENCOUNTER — Ambulatory Visit: Payer: Self-pay | Admitting: Oncology

## 2009-06-07 LAB — CBC WITH DIFFERENTIAL/PLATELET
BASO%: 0.9 % (ref 0.0–2.0)
EOS%: 0.3 % (ref 0.0–7.0)
LYMPH%: 23.4 % (ref 14.0–49.0)
MCH: 32.5 pg (ref 27.2–33.4)
MCHC: 34.2 g/dL (ref 32.0–36.0)
MONO#: 0.5 10*3/uL (ref 0.1–0.9)
MONO%: 5.9 % (ref 0.0–14.0)
Platelets: 180 10*3/uL (ref 140–400)
RBC: 3.88 10*6/uL — ABNORMAL LOW (ref 4.20–5.82)
WBC: 9.2 10*3/uL (ref 4.0–10.3)

## 2009-06-09 LAB — IMMUNOFIXATION ELECTROPHORESIS
IgG (Immunoglobin G), Serum: 1200 mg/dL (ref 694–1618)
Total Protein, Serum Electrophoresis: 7.2 g/dL (ref 6.0–8.3)

## 2009-06-09 LAB — KAPPA/LAMBDA LIGHT CHAINS
Kappa free light chain: 4.97 mg/dL — ABNORMAL HIGH (ref 0.33–1.94)
Kappa:Lambda Ratio: 2.76 — ABNORMAL HIGH (ref 0.26–1.65)
Lambda Free Lght Chn: 1.8 mg/dL (ref 0.57–2.63)

## 2009-06-09 LAB — COMPREHENSIVE METABOLIC PANEL
ALT: 17 U/L (ref 0–53)
CO2: 23 mEq/L (ref 19–32)
Creatinine, Ser: 1.78 mg/dL — ABNORMAL HIGH (ref 0.40–1.50)
Total Bilirubin: 0.8 mg/dL (ref 0.3–1.2)

## 2009-08-18 ENCOUNTER — Ambulatory Visit: Payer: Self-pay | Admitting: Oncology

## 2009-08-20 LAB — CBC WITH DIFFERENTIAL/PLATELET
Basophils Absolute: 0 10*3/uL (ref 0.0–0.1)
Eosinophils Absolute: 0.1 10*3/uL (ref 0.0–0.5)
HCT: 32 % — ABNORMAL LOW (ref 38.4–49.9)
HGB: 11.2 g/dL — ABNORMAL LOW (ref 13.0–17.1)
MCV: 93.7 fL (ref 79.3–98.0)
NEUT#: 3.5 10*3/uL (ref 1.5–6.5)
NEUT%: 54.6 % (ref 39.0–75.0)
RDW: 13.7 % (ref 11.0–14.6)
lymph#: 2.2 10*3/uL (ref 0.9–3.3)

## 2009-08-23 LAB — COMPREHENSIVE METABOLIC PANEL
ALT: 13 U/L (ref 0–53)
AST: 22 U/L (ref 0–37)
Albumin: 3.7 g/dL (ref 3.5–5.2)
Calcium: 8.7 mg/dL (ref 8.4–10.5)
Chloride: 110 mEq/L (ref 96–112)
Potassium: 5 mEq/L (ref 3.5–5.3)

## 2009-08-23 LAB — KAPPA/LAMBDA LIGHT CHAINS: Kappa free light chain: 3.51 mg/dL — ABNORMAL HIGH (ref 0.33–1.94)

## 2009-08-27 LAB — UIFE/LIGHT CHAINS/TP QN, 24-HR UR
Albumin, U: DETECTED
Free Lambda Lt Chains,Ur: <0.05 mg/dL (ref 0.08–1.01)
Free Lt Chn Excr Rate: 119.63 mg/d
Gamma Globulin, Urine: DETECTED — AB
Time: 24 hours
Total Protein, Urine-Ur/day: 135 mg/d (ref 10–140)
Volume, Urine: 2750 mL

## 2009-08-30 ENCOUNTER — Ambulatory Visit (HOSPITAL_COMMUNITY): Admission: RE | Admit: 2009-08-30 | Discharge: 2009-08-30 | Payer: Self-pay | Admitting: Oncology

## 2009-11-22 ENCOUNTER — Ambulatory Visit: Payer: Self-pay | Admitting: Oncology

## 2009-11-22 LAB — CBC WITH DIFFERENTIAL/PLATELET
BASO%: 0.3 % (ref 0.0–2.0)
EOS%: 0.5 % (ref 0.0–7.0)
MCH: 32 pg (ref 27.2–33.4)
MCHC: 33.9 g/dL (ref 32.0–36.0)
RDW: 14.2 % (ref 11.0–14.6)
lymph#: 1.7 10*3/uL (ref 0.9–3.3)

## 2009-11-24 LAB — KAPPA/LAMBDA LIGHT CHAINS
Kappa:Lambda Ratio: 1.97 — ABNORMAL HIGH (ref 0.26–1.65)
Lambda Free Lght Chn: 3.27 mg/dL — ABNORMAL HIGH (ref 0.57–2.63)

## 2009-11-24 LAB — COMPREHENSIVE METABOLIC PANEL
ALT: 18 U/L (ref 0–53)
AST: 26 U/L (ref 0–37)
Albumin: 4.3 g/dL (ref 3.5–5.2)
Calcium: 9 mg/dL (ref 8.4–10.5)
Chloride: 98 mEq/L (ref 96–112)
Creatinine, Ser: 1.35 mg/dL (ref 0.40–1.50)
Potassium: 4.4 mEq/L (ref 3.5–5.3)
Sodium: 132 mEq/L — ABNORMAL LOW (ref 135–145)

## 2009-11-24 LAB — IMMUNOFIXATION ELECTROPHORESIS: IgM, Serum: 76 mg/dL (ref 60–263)

## 2010-01-17 ENCOUNTER — Ambulatory Visit: Payer: Self-pay | Admitting: Oncology

## 2010-01-19 LAB — CBC WITH DIFFERENTIAL/PLATELET
BASO%: 0.3 % (ref 0.0–2.0)
LYMPH%: 29.4 % (ref 14.0–49.0)
MCHC: 34.3 g/dL (ref 32.0–36.0)
MONO#: 0.6 10*3/uL (ref 0.1–0.9)
RBC: 3.81 10*6/uL — ABNORMAL LOW (ref 4.20–5.82)
RDW: 14.1 % (ref 11.0–14.6)
WBC: 7.3 10*3/uL (ref 4.0–10.3)
lymph#: 2.1 10*3/uL (ref 0.9–3.3)

## 2010-01-19 LAB — UIFE/LIGHT CHAINS/TP QN, 24-HR UR
Alpha 1, Urine: DETECTED — AB
Alpha 2, Urine: DETECTED — AB
Beta, Urine: DETECTED — AB
Free Kappa/Lambda Ratio: 20.37 ratio — ABNORMAL HIGH (ref 0.46–4.00)
Total Protein, Urine-Ur/day: 286 mg/d — ABNORMAL HIGH (ref 10–140)
Total Protein, Urine: 12.3 mg/dL

## 2010-01-21 LAB — KAPPA/LAMBDA LIGHT CHAINS
Kappa:Lambda Ratio: 1.73 — ABNORMAL HIGH (ref 0.26–1.65)
Lambda Free Lght Chn: 4.18 mg/dL — ABNORMAL HIGH (ref 0.57–2.63)

## 2010-01-21 LAB — COMPREHENSIVE METABOLIC PANEL
ALT: 19 U/L (ref 0–53)
CO2: 22 mEq/L (ref 19–32)
Chloride: 97 mEq/L (ref 96–112)
Sodium: 129 mEq/L — ABNORMAL LOW (ref 135–145)
Total Bilirubin: 0.7 mg/dL (ref 0.3–1.2)
Total Protein: 7 g/dL (ref 6.0–8.3)

## 2010-01-21 LAB — IMMUNOFIXATION ELECTROPHORESIS: IgM, Serum: 72 mg/dL (ref 60–263)

## 2010-01-21 LAB — URIC ACID: Uric Acid, Serum: 6.9 mg/dL (ref 4.0–7.8)

## 2010-02-02 LAB — UIFE/LIGHT CHAINS/TP QN, 24-HR UR
Alpha 2, Urine: DETECTED — AB
Beta, Urine: DETECTED — AB
Free Kappa Lt Chains,Ur: 6.52 mg/dL — ABNORMAL HIGH (ref 0.04–1.51)
Gamma Globulin, Urine: DETECTED — AB
Total Protein, Urine: 7.2 mg/dL

## 2010-02-09 LAB — BASIC METABOLIC PANEL
BUN: 28 mg/dL — ABNORMAL HIGH (ref 6–23)
CO2: 24 mEq/L (ref 19–32)
Chloride: 99 mEq/L (ref 96–112)
Creatinine, Ser: 1.3 mg/dL (ref 0.40–1.50)
Glucose, Bld: 92 mg/dL (ref 70–99)

## 2010-02-28 ENCOUNTER — Ambulatory Visit: Payer: Self-pay | Admitting: Oncology

## 2010-03-02 LAB — CBC WITH DIFFERENTIAL/PLATELET
Basophils Absolute: 0.1 10*3/uL (ref 0.0–0.1)
EOS%: 1.2 % (ref 0.0–7.0)
Eosinophils Absolute: 0.1 10*3/uL (ref 0.0–0.5)
HGB: 12.4 g/dL — ABNORMAL LOW (ref 13.0–17.1)
LYMPH%: 29.3 % (ref 14.0–49.0)
MCH: 33 pg (ref 27.2–33.4)
MCV: 95.5 fL (ref 79.3–98.0)
MONO%: 6.2 % (ref 0.0–14.0)
NEUT#: 5.4 10*3/uL (ref 1.5–6.5)
Platelets: 176 10*3/uL (ref 140–400)
RBC: 3.76 10*6/uL — ABNORMAL LOW (ref 4.20–5.82)

## 2010-03-02 LAB — COMPREHENSIVE METABOLIC PANEL
Alkaline Phosphatase: 44 U/L (ref 39–117)
BUN: 36 mg/dL — ABNORMAL HIGH (ref 6–23)
Glucose, Bld: 102 mg/dL — ABNORMAL HIGH (ref 70–99)
Total Bilirubin: 1 mg/dL (ref 0.3–1.2)

## 2010-03-04 LAB — IMMUNOFIXATION ELECTROPHORESIS
IgA: 318 mg/dL (ref 68–378)
IgG (Immunoglobin G), Serum: 1170 mg/dL (ref 694–1618)
IgM, Serum: 75 mg/dL (ref 60–263)

## 2010-03-28 LAB — CBC WITH DIFFERENTIAL/PLATELET
BASO%: 0.3 % (ref 0.0–2.0)
EOS%: 1.9 % (ref 0.0–7.0)
HCT: 35.8 % — ABNORMAL LOW (ref 38.4–49.9)
LYMPH%: 28 % (ref 14.0–49.0)
MCH: 32.6 pg (ref 27.2–33.4)
MCHC: 33.4 g/dL (ref 32.0–36.0)
MONO%: 8.9 % (ref 0.0–14.0)
NEUT%: 60.9 % (ref 39.0–75.0)
Platelets: 172 10*3/uL (ref 140–400)
RBC: 3.66 10*6/uL — ABNORMAL LOW (ref 4.20–5.82)
WBC: 6.4 10*3/uL (ref 4.0–10.3)

## 2010-03-29 LAB — COMPREHENSIVE METABOLIC PANEL
ALT: 15 U/L (ref 0–53)
AST: 31 U/L (ref 0–37)
Alkaline Phosphatase: 43 U/L (ref 39–117)
Creatinine, Ser: 1.18 mg/dL (ref 0.40–1.50)
Sodium: 133 mEq/L — ABNORMAL LOW (ref 135–145)
Total Bilirubin: 0.6 mg/dL (ref 0.3–1.2)
Total Protein: 6.8 g/dL (ref 6.0–8.3)

## 2010-03-29 LAB — IGG, IGA, IGM: IgG (Immunoglobin G), Serum: 1270 mg/dL (ref 694–1618)

## 2010-03-29 LAB — KAPPA/LAMBDA LIGHT CHAINS
Kappa free light chain: 6.19 mg/dL — ABNORMAL HIGH (ref 0.33–1.94)
Kappa:Lambda Ratio: 2.54 — ABNORMAL HIGH (ref 0.26–1.65)
Lambda Free Lght Chn: 2.44 mg/dL (ref 0.57–2.63)

## 2010-03-31 LAB — UIFE/LIGHT CHAINS/TP QN, 24-HR UR
Alpha 1, Urine: DETECTED — AB
Gamma Globulin, Urine: DETECTED — AB

## 2010-04-05 ENCOUNTER — Ambulatory Visit: Payer: Self-pay | Admitting: Oncology

## 2010-05-02 LAB — BASIC METABOLIC PANEL
BUN: 40 mg/dL — ABNORMAL HIGH (ref 6–23)
Calcium: 9.2 mg/dL (ref 8.4–10.5)
Glucose, Bld: 106 mg/dL — ABNORMAL HIGH (ref 70–99)
Potassium: 4.6 mEq/L (ref 3.5–5.3)
Sodium: 136 mEq/L (ref 135–145)

## 2010-05-04 LAB — UIFE/LIGHT CHAINS/TP QN, 24-HR UR
Albumin, U: DETECTED
Beta, Urine: DETECTED — AB
Free Lambda Lt Chains,Ur: 1.08 mg/dL — ABNORMAL HIGH (ref 0.08–1.01)
Gamma Globulin, Urine: DETECTED — AB

## 2010-05-30 ENCOUNTER — Ambulatory Visit: Payer: Self-pay | Admitting: Oncology

## 2010-05-30 LAB — BASIC METABOLIC PANEL
Chloride: 102 mEq/L (ref 96–112)
Potassium: 5.1 mEq/L (ref 3.5–5.3)
Sodium: 132 mEq/L — ABNORMAL LOW (ref 135–145)

## 2010-06-01 LAB — UIFE/LIGHT CHAINS/TP QN, 24-HR UR
Albumin, U: DETECTED
Alpha 1, Urine: DETECTED — AB
Free Lambda Lt Chains,Ur: 1.39 mg/dL — ABNORMAL HIGH (ref 0.08–1.01)
Gamma Globulin, Urine: DETECTED — AB
Total Protein, Urine: 44.2 mg/dL

## 2010-06-29 ENCOUNTER — Ambulatory Visit: Payer: Self-pay | Admitting: Oncology

## 2010-06-29 LAB — CBC WITH DIFFERENTIAL/PLATELET
BASO%: 0.3 % (ref 0.0–2.0)
Basophils Absolute: 0 10*3/uL (ref 0.0–0.1)
EOS%: 1 % (ref 0.0–7.0)
HGB: 10.5 g/dL — ABNORMAL LOW (ref 13.0–17.1)
MCH: 32 pg (ref 27.2–33.4)
MCHC: 33.4 g/dL (ref 32.0–36.0)
MCV: 95.7 fL (ref 79.3–98.0)
MONO%: 10.4 % (ref 0.0–14.0)
RBC: 3.28 10*6/uL — ABNORMAL LOW (ref 4.20–5.82)
RDW: 14.2 % (ref 11.0–14.6)
lymph#: 2.7 10*3/uL (ref 0.9–3.3)

## 2010-07-13 ENCOUNTER — Ambulatory Visit (HOSPITAL_COMMUNITY): Admission: RE | Admit: 2010-07-13 | Discharge: 2010-07-13 | Payer: Self-pay | Admitting: Oncology

## 2010-08-26 ENCOUNTER — Ambulatory Visit: Payer: Self-pay | Admitting: Oncology

## 2010-08-29 LAB — COMPREHENSIVE METABOLIC PANEL
ALT: 18 U/L (ref 0–53)
AST: 32 U/L (ref 0–37)
Albumin: 3.4 g/dL — ABNORMAL LOW (ref 3.5–5.2)
Alkaline Phosphatase: 38 U/L — ABNORMAL LOW (ref 39–117)
Glucose, Bld: 103 mg/dL — ABNORMAL HIGH (ref 70–99)
Potassium: 4.3 mEq/L (ref 3.5–5.3)
Sodium: 132 mEq/L — ABNORMAL LOW (ref 135–145)
Total Protein: 7.1 g/dL (ref 6.0–8.3)

## 2010-08-29 LAB — CBC WITH DIFFERENTIAL/PLATELET
BASO%: 1.6 % (ref 0.0–2.0)
EOS%: 0.8 % (ref 0.0–7.0)
Eosinophils Absolute: 0.1 10*3/uL (ref 0.0–0.5)
MCV: 97.4 fL (ref 79.3–98.0)
MONO%: 5.9 % (ref 0.0–14.0)
NEUT#: 4.8 10*3/uL (ref 1.5–6.5)
RBC: 3.25 10*6/uL — ABNORMAL LOW (ref 4.20–5.82)
RDW: 13.9 % (ref 11.0–14.6)

## 2010-08-30 LAB — KAPPA/LAMBDA LIGHT CHAINS: Kappa:Lambda Ratio: 3.18 — ABNORMAL HIGH (ref 0.26–1.65)

## 2010-08-31 LAB — UIFE/LIGHT CHAINS/TP QN, 24-HR UR
Alpha 2, Urine: DETECTED — AB
Free Kappa/Lambda Ratio: 52.38 ratio — ABNORMAL HIGH (ref 0.46–4.00)
Free Lambda Excretion/Day: 10.4 mg/d
Free Lt Chn Excr Rate: 544.5 mg/d
Time: 24 hours
Total Protein, Urine-Ur/day: 567 mg/d — ABNORMAL HIGH (ref 10–140)
Total Protein, Urine: 22.9 mg/dL

## 2010-10-06 ENCOUNTER — Ambulatory Visit: Payer: Self-pay | Admitting: Oncology

## 2010-10-10 LAB — CBC WITH DIFFERENTIAL/PLATELET
Eosinophils Absolute: 0.1 10*3/uL (ref 0.0–0.5)
HCT: 33.6 % — ABNORMAL LOW (ref 38.4–49.9)
HGB: 11.5 g/dL — ABNORMAL LOW (ref 13.0–17.1)
LYMPH%: 30.6 % (ref 14.0–49.0)
MONO#: 0.6 10*3/uL (ref 0.1–0.9)
NEUT#: 3.6 10*3/uL (ref 1.5–6.5)
NEUT%: 57.2 % (ref 39.0–75.0)
Platelets: 158 10*3/uL (ref 140–400)
RBC: 3.52 10*6/uL — ABNORMAL LOW (ref 4.20–5.82)
WBC: 6.4 10*3/uL (ref 4.0–10.3)

## 2010-10-11 LAB — COMPREHENSIVE METABOLIC PANEL
Albumin: 4.1 g/dL (ref 3.5–5.2)
CO2: 24 mEq/L (ref 19–32)
Glucose, Bld: 75 mg/dL (ref 70–99)
Sodium: 132 mEq/L — ABNORMAL LOW (ref 135–145)
Total Bilirubin: 0.6 mg/dL (ref 0.3–1.2)
Total Protein: 6.9 g/dL (ref 6.0–8.3)

## 2010-10-11 LAB — IGG, IGA, IGM
IgA: 332 mg/dL (ref 68–378)
IgG (Immunoglobin G), Serum: 1250 mg/dL (ref 694–1618)

## 2010-10-11 LAB — KAPPA/LAMBDA LIGHT CHAINS: Lambda Free Lght Chn: 2.11 mg/dL (ref 0.57–2.63)

## 2010-10-12 LAB — UIFE/LIGHT CHAINS/TP QN, 24-HR UR
Alpha 1, Urine: DETECTED — AB
Alpha 2, Urine: DETECTED — AB
Free Kappa/Lambda Ratio: 90.74 ratio — ABNORMAL HIGH (ref 0.46–4.00)
Total Protein, Urine-Ur/day: 734 mg/d — ABNORMAL HIGH (ref 10–140)
Total Protein, Urine: 25.3 mg/dL

## 2010-10-21 ENCOUNTER — Ambulatory Visit: Payer: Self-pay | Admitting: Cardiology

## 2010-11-02 ENCOUNTER — Encounter: Payer: Self-pay | Admitting: Cardiology

## 2010-11-02 ENCOUNTER — Ambulatory Visit (HOSPITAL_COMMUNITY)
Admission: RE | Admit: 2010-11-02 | Discharge: 2010-11-02 | Payer: Self-pay | Source: Home / Self Care | Attending: Cardiology | Admitting: Cardiology

## 2010-11-02 ENCOUNTER — Ambulatory Visit: Payer: Self-pay

## 2010-11-02 DIAGNOSIS — Z9289 Personal history of other medical treatment: Secondary | ICD-10-CM

## 2010-11-02 HISTORY — DX: Personal history of other medical treatment: Z92.89

## 2010-11-16 ENCOUNTER — Ambulatory Visit: Payer: Self-pay | Admitting: Oncology

## 2010-11-18 LAB — CBC WITH DIFFERENTIAL/PLATELET
Basophils Absolute: 0 10*3/uL (ref 0.0–0.1)
Eosinophils Absolute: 0.1 10*3/uL (ref 0.0–0.5)
HGB: 11.6 g/dL — ABNORMAL LOW (ref 13.0–17.1)
MONO#: 0.8 10*3/uL (ref 0.1–0.9)
NEUT#: 4.7 10*3/uL (ref 1.5–6.5)
RDW: 14.1 % (ref 11.0–14.6)
lymph#: 2.1 10*3/uL (ref 0.9–3.3)

## 2010-11-23 LAB — COMPREHENSIVE METABOLIC PANEL
ALT: 17 U/L (ref 0–53)
AST: 29 U/L (ref 0–37)
Albumin: 4.3 g/dL (ref 3.5–5.2)
Alkaline Phosphatase: 44 U/L (ref 39–117)
BUN: 34 mg/dL — ABNORMAL HIGH (ref 6–23)
CO2: 23 mEq/L (ref 19–32)
Calcium: 9.4 mg/dL (ref 8.4–10.5)
Chloride: 104 mEq/L (ref 96–112)
Creatinine, Ser: 1.34 mg/dL (ref 0.40–1.50)
Glucose, Bld: 108 mg/dL — ABNORMAL HIGH (ref 70–99)
Potassium: 5 mEq/L (ref 3.5–5.3)
Sodium: 135 mEq/L (ref 135–145)
Total Bilirubin: 0.6 mg/dL (ref 0.3–1.2)
Total Protein: 7.5 g/dL (ref 6.0–8.3)

## 2010-11-23 LAB — KAPPA/LAMBDA LIGHT CHAINS
Kappa free light chain: 31.9 mg/dL — ABNORMAL HIGH (ref 0.33–1.94)
Kappa:Lambda Ratio: 11.6 — ABNORMAL HIGH (ref 0.26–1.65)
Lambda Free Lght Chn: 2.75 mg/dL — ABNORMAL HIGH (ref 0.57–2.63)

## 2010-11-23 LAB — IGG, IGA, IGM
IgA: 375 mg/dL (ref 68–378)
IgG (Immunoglobin G), Serum: 1290 mg/dL (ref 694–1618)
IgM, Serum: 82 mg/dL (ref 60–263)

## 2010-12-12 LAB — CBC WITH DIFFERENTIAL/PLATELET
Basophils Absolute: 0 10*3/uL (ref 0.0–0.1)
LYMPH%: 22.7 % (ref 14.0–49.0)
MCHC: 34.1 g/dL (ref 32.0–36.0)
MONO#: 1 10*3/uL — ABNORMAL HIGH (ref 0.1–0.9)
MONO%: 10.2 % (ref 0.0–14.0)
NEUT%: 65.8 % (ref 39.0–75.0)
Platelets: 152 10*3/uL (ref 140–400)
RBC: 3.46 10*6/uL — ABNORMAL LOW (ref 4.20–5.82)

## 2010-12-13 LAB — COMPREHENSIVE METABOLIC PANEL
ALT: 20 U/L (ref 0–53)
AST: 32 U/L (ref 0–37)
Albumin: 4.2 g/dL (ref 3.5–5.2)
Alkaline Phosphatase: 47 U/L (ref 39–117)
BUN: 27 mg/dL — ABNORMAL HIGH (ref 6–23)
CO2: 20 mEq/L (ref 19–32)
Creatinine, Ser: 1.13 mg/dL (ref 0.40–1.50)
Glucose, Bld: 94 mg/dL (ref 70–99)
Total Bilirubin: 0.7 mg/dL (ref 0.3–1.2)

## 2010-12-13 LAB — IGG, IGA, IGM: IgG (Immunoglobin G), Serum: 1210 mg/dL (ref 694–1618)

## 2010-12-13 LAB — KAPPA/LAMBDA LIGHT CHAINS: Kappa:Lambda Ratio: 6.24 — ABNORMAL HIGH (ref 0.26–1.65)

## 2010-12-14 LAB — UIFE/LIGHT CHAINS/TP QN, 24-HR UR
Albumin, U: DETECTED
Alpha 2, Urine: DETECTED — AB
Free Lambda Lt Chains,Ur: 0.48 mg/dL (ref 0.08–1.01)
Total Protein, Urine: 48.4 mg/dL

## 2010-12-16 ENCOUNTER — Ambulatory Visit (HOSPITAL_BASED_OUTPATIENT_CLINIC_OR_DEPARTMENT_OTHER): Payer: Self-pay | Admitting: Oncology

## 2010-12-20 DIAGNOSIS — R972 Elevated prostate specific antigen [PSA]: Secondary | ICD-10-CM

## 2010-12-20 DIAGNOSIS — C9000 Multiple myeloma not having achieved remission: Secondary | ICD-10-CM

## 2010-12-20 DIAGNOSIS — G609 Hereditary and idiopathic neuropathy, unspecified: Secondary | ICD-10-CM

## 2010-12-20 DIAGNOSIS — Z86718 Personal history of other venous thrombosis and embolism: Secondary | ICD-10-CM

## 2011-01-11 ENCOUNTER — Other Ambulatory Visit: Payer: Self-pay | Admitting: Oncology

## 2011-01-11 ENCOUNTER — Encounter (HOSPITAL_BASED_OUTPATIENT_CLINIC_OR_DEPARTMENT_OTHER): Payer: Medicare Other | Admitting: Oncology

## 2011-01-11 DIAGNOSIS — C9000 Multiple myeloma not having achieved remission: Secondary | ICD-10-CM

## 2011-01-11 DIAGNOSIS — Z86718 Personal history of other venous thrombosis and embolism: Secondary | ICD-10-CM

## 2011-01-11 DIAGNOSIS — R972 Elevated prostate specific antigen [PSA]: Secondary | ICD-10-CM

## 2011-01-11 DIAGNOSIS — G609 Hereditary and idiopathic neuropathy, unspecified: Secondary | ICD-10-CM

## 2011-01-11 LAB — UIFE/LIGHT CHAINS/TP QN, 24-HR UR
Albumin, U: DETECTED
Alpha 1, Urine: DETECTED — AB
Beta, Urine: DETECTED — AB
Free Lt Chn Excr Rate: 4650 mg/d
Gamma Globulin, Urine: DETECTED — AB
Time: 24 hours
Total Protein, Urine: 188.5 mg/dL

## 2011-01-11 LAB — CBC WITH DIFFERENTIAL/PLATELET
Basophils Absolute: 0 10*3/uL (ref 0.0–0.1)
Eosinophils Absolute: 0.3 10*3/uL (ref 0.0–0.5)
HGB: 11 g/dL — ABNORMAL LOW (ref 13.0–17.1)
NEUT#: 4.1 10*3/uL (ref 1.5–6.5)
RDW: 14.4 % (ref 11.0–14.6)
WBC: 7.3 10*3/uL (ref 4.0–10.3)
lymph#: 2 10*3/uL (ref 0.9–3.3)

## 2011-01-12 LAB — COMPREHENSIVE METABOLIC PANEL
Albumin: 4 g/dL (ref 3.5–5.2)
BUN: 23 mg/dL (ref 6–23)
CO2: 22 mEq/L (ref 19–32)
Calcium: 9.6 mg/dL (ref 8.4–10.5)
Chloride: 100 mEq/L (ref 96–112)
Glucose, Bld: 73 mg/dL (ref 70–99)
Potassium: 4.5 mEq/L (ref 3.5–5.3)
Total Protein: 6.5 g/dL (ref 6.0–8.3)

## 2011-01-12 LAB — KAPPA/LAMBDA LIGHT CHAINS
Kappa:Lambda Ratio: 7.78 — ABNORMAL HIGH (ref 0.26–1.65)
Lambda Free Lght Chn: 2.48 mg/dL (ref 0.57–2.63)

## 2011-01-18 ENCOUNTER — Encounter (HOSPITAL_BASED_OUTPATIENT_CLINIC_OR_DEPARTMENT_OTHER): Payer: Medicare Other | Admitting: Oncology

## 2011-01-18 DIAGNOSIS — Z86718 Personal history of other venous thrombosis and embolism: Secondary | ICD-10-CM

## 2011-01-18 DIAGNOSIS — G609 Hereditary and idiopathic neuropathy, unspecified: Secondary | ICD-10-CM

## 2011-01-18 DIAGNOSIS — R972 Elevated prostate specific antigen [PSA]: Secondary | ICD-10-CM

## 2011-01-18 DIAGNOSIS — C9000 Multiple myeloma not having achieved remission: Secondary | ICD-10-CM

## 2011-02-03 LAB — CREATININE, SERUM: GFR calc non Af Amer: 42 mL/min — ABNORMAL LOW (ref >60)

## 2011-02-08 ENCOUNTER — Encounter (HOSPITAL_BASED_OUTPATIENT_CLINIC_OR_DEPARTMENT_OTHER): Payer: Medicare Other | Admitting: Oncology

## 2011-02-08 ENCOUNTER — Other Ambulatory Visit: Payer: Self-pay | Admitting: Oncology

## 2011-02-08 DIAGNOSIS — R972 Elevated prostate specific antigen [PSA]: Secondary | ICD-10-CM

## 2011-02-08 DIAGNOSIS — G609 Hereditary and idiopathic neuropathy, unspecified: Secondary | ICD-10-CM

## 2011-02-08 DIAGNOSIS — Z86718 Personal history of other venous thrombosis and embolism: Secondary | ICD-10-CM

## 2011-02-08 DIAGNOSIS — C9000 Multiple myeloma not having achieved remission: Secondary | ICD-10-CM

## 2011-02-08 LAB — CBC WITH DIFFERENTIAL/PLATELET
BASO%: 0.1 % (ref 0.0–2.0)
Eosinophils Absolute: 0 10*3/uL (ref 0.0–0.5)
HCT: 29.1 % — ABNORMAL LOW (ref 38.4–49.9)
LYMPH%: 19.2 % (ref 14.0–49.0)
MONO#: 0.9 10*3/uL (ref 0.1–0.9)
NEUT#: 7.7 10*3/uL — ABNORMAL HIGH (ref 1.5–6.5)
Platelets: 176 10*3/uL (ref 140–400)
RBC: 3.14 10*6/uL — ABNORMAL LOW (ref 4.20–5.82)
WBC: 10.6 10*3/uL — ABNORMAL HIGH (ref 4.0–10.3)
lymph#: 2 10*3/uL (ref 0.9–3.3)

## 2011-02-09 LAB — COMPREHENSIVE METABOLIC PANEL
ALT: 17 U/L (ref 0–53)
AST: 42 U/L — ABNORMAL HIGH (ref 0–37)
Albumin: 4.3 g/dL (ref 3.5–5.2)
Alkaline Phosphatase: 42 U/L (ref 39–117)
Calcium: 9.6 mg/dL (ref 8.4–10.5)
Chloride: 103 mEq/L (ref 96–112)
Creatinine, Ser: 1.27 mg/dL (ref 0.40–1.50)
Potassium: 4.7 mEq/L (ref 3.5–5.3)

## 2011-02-09 LAB — IGG, IGA, IGM
IgG (Immunoglobin G), Serum: 1020 mg/dL (ref 694–1618)
IgM, Serum: 59 mg/dL — ABNORMAL LOW (ref 60–263)

## 2011-02-09 LAB — KAPPA/LAMBDA LIGHT CHAINS: Kappa:Lambda Ratio: 11.14 — ABNORMAL HIGH (ref 0.26–1.65)

## 2011-03-06 ENCOUNTER — Other Ambulatory Visit: Payer: Self-pay | Admitting: Oncology

## 2011-03-08 ENCOUNTER — Encounter (HOSPITAL_BASED_OUTPATIENT_CLINIC_OR_DEPARTMENT_OTHER): Payer: Medicare Other | Admitting: Oncology

## 2011-03-08 ENCOUNTER — Other Ambulatory Visit: Payer: Self-pay | Admitting: Oncology

## 2011-03-08 DIAGNOSIS — C9000 Multiple myeloma not having achieved remission: Secondary | ICD-10-CM

## 2011-03-08 DIAGNOSIS — Z86718 Personal history of other venous thrombosis and embolism: Secondary | ICD-10-CM

## 2011-03-08 DIAGNOSIS — R972 Elevated prostate specific antigen [PSA]: Secondary | ICD-10-CM

## 2011-03-08 DIAGNOSIS — G609 Hereditary and idiopathic neuropathy, unspecified: Secondary | ICD-10-CM

## 2011-03-08 LAB — UIFE/LIGHT CHAINS/TP QN, 24-HR UR
Albumin, U: DETECTED
Alpha 1, Urine: DETECTED — AB
Beta, Urine: DETECTED — AB
Gamma Globulin, Urine: DETECTED — AB

## 2011-03-08 LAB — CBC WITH DIFFERENTIAL/PLATELET
EOS%: 0.7 % (ref 0.0–7.0)
Eosinophils Absolute: 0.1 10*3/uL (ref 0.0–0.5)
LYMPH%: 23.7 % (ref 14.0–49.0)
MCHC: 34.1 g/dL (ref 32.0–36.0)
MONO#: 1.1 10*3/uL — ABNORMAL HIGH (ref 0.1–0.9)
MONO%: 10.8 % (ref 0.0–14.0)
NEUT#: 6.6 10*3/uL — ABNORMAL HIGH (ref 1.5–6.5)
Platelets: 186 10*3/uL (ref 140–400)
RBC: 3.42 10*6/uL — ABNORMAL LOW (ref 4.20–5.82)
WBC: 10.3 10*3/uL (ref 4.0–10.3)
lymph#: 2.4 10*3/uL (ref 0.9–3.3)

## 2011-03-09 LAB — COMPREHENSIVE METABOLIC PANEL
Alkaline Phosphatase: 49 U/L (ref 39–117)
BUN: 44 mg/dL — ABNORMAL HIGH (ref 6–23)
Glucose, Bld: 100 mg/dL — ABNORMAL HIGH (ref 70–99)
Total Bilirubin: 0.6 mg/dL (ref 0.3–1.2)

## 2011-03-09 LAB — IGG, IGA, IGM
IgA: 351 mg/dL (ref 68–378)
IgG (Immunoglobin G), Serum: 1140 mg/dL (ref 694–1618)
IgM, Serum: 72 mg/dL (ref 60–263)

## 2011-03-09 LAB — KAPPA/LAMBDA LIGHT CHAINS
Kappa:Lambda Ratio: 12.81 — ABNORMAL HIGH (ref 0.26–1.65)
Lambda Free Lght Chn: 2.78 mg/dL — ABNORMAL HIGH (ref 0.57–2.63)

## 2011-03-13 ENCOUNTER — Encounter (HOSPITAL_BASED_OUTPATIENT_CLINIC_OR_DEPARTMENT_OTHER): Payer: Medicare Other | Admitting: Oncology

## 2011-03-13 DIAGNOSIS — G609 Hereditary and idiopathic neuropathy, unspecified: Secondary | ICD-10-CM

## 2011-03-13 DIAGNOSIS — C9000 Multiple myeloma not having achieved remission: Secondary | ICD-10-CM

## 2011-03-13 DIAGNOSIS — Z86718 Personal history of other venous thrombosis and embolism: Secondary | ICD-10-CM

## 2011-04-04 NOTE — H&P (Signed)
Timothy, [name] NO.:  [id number]   MEDICAL RECORD NO.:  [id number]          PATIENT TYPE:  INP   LOCATION:  1312                         FACILITY:  [facility]   PHYSICIAN:  Valentino Hue. 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  He was then treated with Velcade which yielded  an excellent response.  His course of treatment was complicated by  diverticular abscess, eventually removed Apr 10, 2007, with a  significant drop in functional status.  He has been recovering steadily  over the last several months, and as far as the multiple myeloma is  concerned, he continues in remission.  He is currently on Decadron pulse  four days of each month, last given on February 19 through January 12, 2008.  He also receives Zometa on an every 6 week basis, next dose due  on Tuesday, January 28, 2008.   We were contacted by [name] ophthalmologist earlier this morning  stating that [name] had been diagnosed with herpes zoster in the  conjunctiva of the right eye.  On presentation, [name] tells me that  his forehead began to feel funny on Sunday.  Soon thereafter earlier  in the week, he began to notice the small blisters concentrated  primarily on the right forehead and scalp, spreading down towards the  eye.  He was evaluated by Dr. Orvan Falconer on Wednesday, January 22, 2008 and  was diagnosed with herpes zoster.  He was placed on Famvir on which he  has continued.  Unfortunately, the herpes zoster seems to have worsened  since that time.  His eye is extremely swollen and  uncomfortable,  although he denies frank pain.  Obviously, at this point, his vision is  somewhat affected in the right eye, primarily due to the edema.   [name] has had no fevers or chills.  His appetite is good with no  nausea or emesis.  He is sleeping well at night and in fact really has  no additional complaints at this time.  He has no additional rashes  elsewhere.   ALLERGIES:  INTOLERANT OF MORPHINE.   CURRENT MEDICATIONS:  1. Lipitor 20 mg daily.  2. Remeron 30 mg daily.  3. Elavil 25 mg q.h.s.  4. Dilaudid 4 mg p.o. p.r.n. pain.  5. Coreg 3.125 mg p.o. b.i.d.  6. Aspirin 81 mg daily.  7. Decadron pulse 10 mg p.o. q.i.d. x4 days, repeated monthly (last      given January 09, 2008 through January 12, 2008).  8. Prednisone 5 mg p.o. daily on days the patient is not taking      Decadron pulse.   PAST MEDICAL HISTORY:  1. Herpes zoster affecting the right eye and  conjunctiva.  2. Multiple myeloma, currently in remission.  3. History of pulmonary embolus in August of 2008.  4. History of left ventricular dysfunction.  5. Hypertension.  6. History of coronary artery bypass grafting in 1991.  7. Status post tonsillectomy as a child.  8. Status post remote hernia surgery.  9. Status post previous lumbar laminectomy.  10.Status post resection of sigmoid colon with history of diverticular      abscess in May of 2008.   FAMILY HISTORY:  Noncontributory.   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   REVIEW OF SYSTEMS:  As per HPI.  Primary complaint is a vesicular rash  causing discomfort and edema in the right side of the face, affecting  primarily the right forehead, scalp, as well as the right eye and  conjunctivae.  The patient denies frank pain at this time.  He has no  additional rashes elsewhere.  He has had no known fevers, chills, or  night sweats.  No abnormal bleeding.  His appetite is  good.  He has had  no nausea or emesis.  No change in bowel or bladder habits.  No current  cough, phlegm production, or increased shortness of breath.  No chest  pain, pressure, or palpitations.  No abnormal headaches at this time.  There has been some change in vision, specifically decreased vision in  the right eye secondary to edema.   PHYSICAL EXAMINATION:  GENERAL:  Pleasant white male appearing his  stated age in no acute distress.  VITAL SIGNS:  Blood pressure 154/91, pulse 94, respirations 20,  temperature 97.1.  Weight 165.3, height 71 inches.  HEENT:  There is evidence of edema, erythema, and a vesicular rash  spreading from the right eye (including the right conjunctiva) up the  right forehead and into the temporal area and scalp.  The left eye is  benign and unaffected.  Sclera anicteric and conjunctiva pink.  No  erythema or edema noted on the left, and no vesicular rash whatsoever on  the left.  Oropharynx is benign.  No mucositis or candidiasis noted.  NECK:  Supple.  LYMPHATICS:  No cervical, supraclavicular, or axillary lymphadenopathy  palpated.  LUNGS:  Clear to auscultation bilaterally.  No crackles, rhonchi, or  wheezes.  HEART:  Regular rate and rhythm.  ABDOMEN:  Soft, nontender, positive bowel sounds x4.  EXTREMITIES:  Nonpitting pedal edema noted bilaterally, equal  bilaterally.  No upper extremity edema is noted.  SKIN:  Benign, with the exception of the vesicular rash noted above.  GENITOURINARY:  Deferred.  NEUROLOGIC:  Nonfocal.  The patient is alert and oriented x3.   This is a 77 year old [place] man with a diagnosis of multiple  myeloma made in June of 2007.  He was treated with Decadron and  thalidomide but with significant neuropathy developing.  He was then  treated with Velcade which yielded an excellent response, and he is  currently in remission with regards to the myeloma.  He continues on  Decadron pulse 10 mg q.i.d. x4 days each month,  last given January 09, 2008 through January 12, 2008.  He also receives every 6 weeks Zometa,  due for next dose on January 28, 2008.  Now, with the development of  herpes zoster affecting the right face, scalp, and right  eye/conjunctiva.  He is currently being admitted for management and IV  antivirals in the setting of an immunocompromised  host.   [name] will be started on acyclovir IV with a first dose of 750 mg,  further dosing per pharmacy.  He will continue his current medications.   This case, admission, and plan have all been reviewed with Dr. Ruthann Cancer who voices his agreement.      Zollie Scale, PA      Valentino Hue. Magrinat, M.D.  Electronically Signed    AB/MEDQ  D:  01/24/2008  T:  01/26/2008  Job:  [id number]   cc:   Colleen Can. Deborah Chalk, M.D.  Fax: [phone]   Stefani Dama, M.D.  Fax: [phone]   [facility] Salvia Eliott Nine, M.D.  Fax: [phone]   Ollen Gross. Vernell Morgans, M.D.  [address]  [place]  [place] [address]

## 2011-04-04 NOTE — Op Note (Signed)
Timothy [name], Timothy [name]               ACCOUNT NO.:  [id number]   MEDICAL RECORD NO.:  [id number]          PATIENT TYPE:  INP   LOCATION:  1408                         FACILITY[facility]  [facility]   PHYSICIAN:  Alfonse Ras, MD   DATE OF BIRTH:  1928-08-28   DATE OF PROCEDURE:  04/10/2007  DATE OF DISCHARGE:                               OPERATIVE REPORT   PREOPERATIVE DIAGNOSIS:  1. History of multiple myeloma.  2. History of diverticulosis.  3. Pelvic abscess.   POSTOPERATIVE DIAGNOSIS:  1. History of multiple myeloma.  2. History of diverticulosis.  3. Pelvic abscess.   PROCEDURES:  Sigmoid colectomy with an end-to-end stapled anastomosis  with a 29-mm EEA stapler approximately 15 cm from the anal verge.   ANESTHESIA:  General.   SURGEON:  Alfonse Ras, MD   ASSISTANT:  Angelia Mould. Derrell Lolling, M.D.   DESCRIPTION:  The patient was taken to the operating room, placed in the  supine position in the lithotomy position.  Dr. Earlene Plater will dictate the  cystoscopy and stent placement and a separate port.  After bilateral  ureteral stents had been placed the abdomen was prepped and draped in a  normal sterile fashion.  Using a lower vertical midline incision I  dissected down to the fascia and the fascia was opened vertically.  I  entered the abdomen.  The sigmoid colon was identified.  There was a  significant amount of inflammation in the posterior region along the  sacral promontory.  The lateral attachments of the sigmoid colon along  the white line of Toldt were incised, and the sigmoid colon was  mobilized along with the descending colon.  There is significant  inflammation in the posterior portion of the sigmoid colon.  The  descending colon was transected using a GIA stapling device proximal to  the area of inflammation.  The ureters were identified by palpation with  the stents in place.  The area of abscess cavity and the mesentery of  the sigmoid colon were taken down using the  LigaSure.   This was taken down to the peritoneal reflection.  Using the contour  stapling device.  The rectum was divided.  The proximal colon was then  brought up, the staple line was removed; and a 2-0 Prolene suture in a  pursestring fashion with placed around the colotomy.  The proximal bowel  was clamped with a spring clamp.  I placed a second pursestring suture  to secure the anvil.  The EEA stapling device was then placed up through  the rectum by Dr. Derrell Lolling.  The two ends were brought together; and they  showed that there was no twist of the mesentery of the colon.  The  stapler was fired.  Proctoscopy was then performed which did show a  small leak in the distal portion of the rectum.  This was oversewn with  imbricating 2-0 silk sutures.  Repeat proctoscopy showed no evidence of  leak.  Tisseel was placed over the anastomosis.  FloSeal was placed in  the area of inflammation which was having some  oozing; and this  was easily controlled.  The abdomen was then copiously  irrigated.  The fascia was closed with a running #1 Novofil; and the  skin was closed with staples.  The patient was taken to the recovery  room in good condition.      Alfonse Ras, MD  Electronically Signed     KRE/MEDQ  D:  04/11/2007  T:  04/11/2007  Job:  [id number]

## 2011-04-04 NOTE — Discharge Summary (Signed)
Timothy [name], Timothy [name] NO.:  [id number]   MEDICAL RECORD NO.:  [id number]          PATIENT TYPE:  INP   LOCATION:  3702                         FACILITY:  [facility]   PHYSICIAN:  Elmore Guise., M.D.DATE OF BIRTH:  Dec 11, 1927   DATE OF ADMISSION:  06/24/2007  DATE OF DISCHARGE:  06/29/2007                               DISCHARGE SUMMARY   DISCHARGE DIAGNOSES:  1. Pulmonary embolus.  2. History of multiple myeloma.  3. History of left ventricular dysfunction.  4. Hypertension.   HISTORY OF PRESENT ILLNESS:  [name] is a very pleasant 77 year old  white male who presented to the office with increased dyspnea,  hypoxemia, and tachycardia.  He was sent for CT scan, which showed  pulmonary embolus.  He was then admitted for anticoagulation and  treatment.   HOSPITAL COURSE:  The patient's hospital course was uncomplicated.  The  patient was started on heparin drip on hospital day #1.  His dyspnea  improved significantly by hospital day #3.  He has now been up and  ambulatory without any significant problems.  His INR is greater than 2.  We continued all his other medicines as before.  His hemoglobin and  platelet count all remained stable during his hospitalization.  He had  improved so much we talked about discharging him on Friday, however,  waited until Saturday and INR greater than 2 prior to discharge.   DISCHARGE MEDICATIONS:  1. Coreg 3.25 mg daily.  2. Prednisone 10 mg daily.  3. Marinol 5 mg 3 times daily.  4. Megace 40 mg 2 times daily.  5. Flomax 0.4 mg daily.  6. Iron and multivitamin 2 times daily.  7. Potassium 20 mEq daily.  8. Ambien 10 mg at bedtime.  9. Remeron 30 mg at bedtime.  10.Amitriptyline 25 mg at bedtime.  11.Dilaudid p.r.n.  12.Cholestyramine p.r.n.  13.Coumadin 5 mg daily.   FOLLOW-UP APPOINTMENTS:  Dr. Roger Shelter.  He will have lab work  done with a PT/INR Wednesday, July 03, 2007.  Otherwise, he will see  Dr.  Deborah Chalk in 1 week.  He is to call if he has any further problems.      Elmore Guise., M.D.  Electronically Signed     TWK/MEDQ  D:  06/30/2007  T:  07/01/2007  Job:  [id number]

## 2011-04-04 NOTE — Discharge Summary (Signed)
Timothy, [name] NO.:  [id number]   MEDICAL RECORD NO.:  [id number]          PATIENT TYPE:  INP   LOCATION:  1312                         FACILITY:  [facility]   PHYSICIAN:  Timothy [name], M.D.DATE OF BIRTH:  1928-07-14   DATE OF ADMISSION:  01/24/2008  DATE OF DISCHARGE:  01/28/2008                               DISCHARGE SUMMARY   DISCHARGE DIAGNOSES:  1. Periorbital zoster, requiring intravenous treatment.  2. Multiple myeloma, currently in remission.  3. History of pulmonary embolus August 2008.  4. History of left ventricular dysfunction.  5. Hypertension.  6. History of coronary artery bypass grafting in 1991.  7. History of tonsillectomy and adenoidectomy.  8. History of herniorrhaphy.  9. History of lumbar laminectomy.  10.History of diverticular abscess, status post sigmoid resection with      end-to-end anastomosis May 2008.   PROCEDURE:  Intravenous acyclovir.   HOSPITAL COURSE:  The patient was admitted after initiation of treatment  with Famvir by the patient's ophthalmologist for his right periorbital  zoster (cranial nerve V/I-II distribution) failed to halt the spread of  shingles.  Upon admission, he was started on 10 mg/kg t.i.d.  intravenously of acyclovir for a total dose of 750 mg intravenously 3  times a day.  The patient tolerated treatment well.  His serum  creatinine on admission was 1.16 with an estimated GFR of greater than  60.  Liver function tests were normal.  Albumin 3.4.  Electrolytes were  normal as well except for a minimal elevation in his glucose at 103 in  this nonfasting sample.  Over the next 3 days, the patient's lesions  began to scab over very nicely, and the swelling around the eye, which  was quite significant initially, largely has resolved.  At the time of  discharge, the patient's right eye, which was completely shut on  admission, now opens normally.   The patient was evaluated by ID, and specifically  Dr. Maurice [name] recommended  1000 mg of Valtrex orally t.i.d. to complete a total of 14 days after 5  days of intravenous treatment.  The patient has Famvir at home, and Dr.  Maurice [name] advises [place] that Famvir and Valtrex are essentially equivalent and  can be substituted milligram for milligram, which is what we are doing.   On [name] 8, Dr. Ozzie Hoyle. [name] evaluated the patient in the hospital.  He found no Hutchinson's sign and no corneal involvement.  He felt  Viroptic eye drops would not be necessary, and these have been  discontinued.   Otherwise, the patient's hospital stay was unremarkable, his blood  pressure remaining stable with no fever or other complications.   At the time of discharge, the patient's temperature was 98.4.  His  shingles again was well demarcated over the fifth cranial nerve (one,  two) distribution, with all the lesions scabbing over nicely, the eye  being not involved and normally open, and no new lesions seen.   White cell count was 13.0, hemoglobin 14.1 and platelets 210,000.  The  patient's total IgG was 697, which is normal, IgA 98, which  is normal,  and IgM 43, which is minimally low.  His kappa and lambda light chains  were 3.7 and 1.48 respectively.   CONDITION ON DISCHARGE:  Improved.   DISCHARGE MEDICATIONS:  We are going to continue the Famvir 500 mg 2  tablets 3 times a day to [name] 20 inclusive.  He will cut down on his  Remeron to 15 mg at bedtime.  He will continue to take amitriptyline 25  mg at bedtime.  He will take Coreg 3.125 mg 1 tablet twice daily,  aspirin at 81 mg daily.  Other medications are as needed, and the  patient's dexamethasone, which is his primary treatment for myeloma,  will be held until further notice.   Diet is regular.  Activity is unrestricted.   WOUND CARE:  He will call if his rash worsens; otherwise, he will return  to see Dr. Darnelle [name] as already scheduled and Dr. Armanda [name] on Friday, the  13th, as already  scheduled.      Timothy [name], M.D.  Electronically Signed     GCM/MEDQ  D:  01/28/2008  T:  01/28/2008  Job:  [id number]   cc:   Timothy [name], M.D.  Fax: [phone]   Timothy [name], M.D.  Fax: [phone]   Timothy [name], M.D.  Fax: [phone]   Timothy [name], M.D.  [address]  [place]  [place] [address]   Timothy [name], M.D.  Fax: [phone]

## 2011-04-04 NOTE — Discharge Summary (Signed)
Timothy [name], Timothy [name]               ACCOUNT NO.:  [id number]   MEDICAL RECORD NO.:  [id number]          PATIENT TYPE:  INP   LOCATION:  1408                         FACILITY:  [facility]   PHYSICIAN:  Alfonse Ras, MD   DATE OF BIRTH:  01/17/1928   DATE OF ADMISSION:  04/10/2007  DATE OF DISCHARGE:  04/18/2007                               DISCHARGE SUMMARY   ADMISSION DIAGNOSES:  1. Diverticulosis.  2. Multiple myeloma.   DISCHARGE DIAGNOSES:  1. Diverticulosis.  2. Multiple myeloma.   CONDITION ON DISCHARGE:  Good and improved and he is being discharged to  home.   MEDICATIONS AT DISCHARGE:  1. Allopurinol 150 mg a day.  2. Flagyl.  3. Cipro had been stopped since admission.  4. Lyrica per the oncologist.  5. Remeron 30 mg nightly.  6. Hydromorphone as needed.   BRIEF HISTORY OF PRESENT ILLNESS:  The patient is a very pleasant 77-  year-old white male who has been suffering from sigmoid diverticulitis  and was originally scheduled for sigmoid colectomy by Dr. Felicity Pellegrini;  however, the patient cancelled secondary to back pain.  Dr. Darnelle Catalan  then contacted [place] saying that the patient was having some failure to  thrive and was really felt that he needed to proceed with sigmoid  colectomy.  I discussed with both Dr. Sherron Monday and Dr. Darvin Neighbours of  placing ureteral stents prior to his admission.  He is admitted now for  sigmoid colectomy.   HOSPITAL COURSE:  He was admitted after home bowel prep and underwent  placement of cystoscopy and bilateral retrograde stents by Dr. Earlene Plater.  We then proceeded with an uneventful, but difficult sigmoid colectomy.  We awaited his ileus over the next few days and he was started on clears  by postoperative day #2; he was tolerating that through postoperative  day #4 and was advanced to a full liquid diet.  He remained afebrile  through his hospital  course.  He was increased to a regular diet on postoperative day #5, but  was still having a  fairly poor appetite.  We continued to encourage him  to take p.o. and Ensure and by postoperative day #8, he was discharged.   FOLLOWUP:  With me in 1 week and Dr. Earlene Plater for Foley removal at the  following week as well.      Alfonse Ras, MD  Electronically Signed     KRE/MEDQ  D:  05/07/2007  T:  05/08/2007  Job:  [id number]

## 2011-04-04 NOTE — Op Note (Signed)
NAME:  Timothy [name], Timothy [name]               ACCOUNT NO.:  [id number]   MEDICAL RECORD NO.:  [id number]          PATIENT TYPE:  INP   LOCATION:  0007                         FACILITY:  [facility]   PHYSICIAN:  Lucrezia Starch. Earlene Plater, M.D.  DATE OF BIRTH:  [birth year]   DATE OF PROCEDURE:  04/10/2007  DATE OF DISCHARGE:                               OPERATIVE REPORT   OPERATIVE PROCEDURE:  Cystourethroscopy, bilateral retrograde ureteral  pyelograms placement of bilateral double-J stents in preparation for  abdominal surgery for Dr. Colin Benton.   ANESTHESIA:  General endotracheal.   BLOOD LOSS:  Negligible.   SURGEON:  Gaynelle Arabian, M.D.   TUBES:  Bilateral 26 cm 6-French contoured double pigtail stents.   COMPLICATIONS:  None.   INDICATIONS:  The patient is a very nice 77 year old white male who has  severe diverticular disease and will undergo left diverticular section.  On examination the left ureter appeared to be dilated and both ureters  appeared to be surrounded by inflammatory tissue.  It was felt that the  stents would facilitate the resection.  After understanding risks,  benefits, alternatives he elected to proceed with this in preparation  for abdominal surgery.   DESCRIPTION OF PROCEDURE:  The patient is placed in supine position,  proper general tracheal anesthesia, and was placed in the dorsal  lithotomy position and prepped and draped with Betadine in a sterile  fashion.  Cystourethroscopy was performed with 22.5 [place] Storz  panendoscope utilizing the 12 and 70 lenses.  The bladder was carefully  inspected noted be without lesion.  There was mild trilobar hypertrophy  and grade 1 trabeculation.  Under fluoroscopic guidance a sensor wire  was placed in the renal pelvis, right and left. 6-French open-ended  catheter was placed and dye was injected. He was noted to have  essentially normal retrograde ureteral pyelogram bilaterally with no  filling defects.  Under fluoroscopic guidance  after wire had been placed  bilateral 126 cm 6-French contour double pigtail stents were placed  without pullout string and noted be in good position within the right  renal and left renal pelvis and within the bladder.  The bladder was  drained.  The panendoscope was removed.  A 16-French Foley catheter was  inserted and the case was turned over to Dr. Colin Benton. Separate retrograde  ureteral  pyelogram, bilateral retrograde pyelograms were performed with 6-French  open-ended catheters.  Both collecting systems appeared to be normal  with no hydronephrosis or filling defects.  This was utilized for  placement of the stent.      Ronald L. Earlene Plater, M.D.  Electronically Signed     RLD/MEDQ  D:  04/10/2007  T:  04/10/2007  Job:  [id number]   cc:   Alfonse Ras, MD  [address]  [place]  [place] [address]

## 2011-04-04 NOTE — H&P (Signed)
Timothy [name], Timothy [name] NO.:  [id number]   MEDICAL RECORD NO.:  [id number]          PATIENT TYPE:  INP   LOCATION:  1843                         FACILITY:  [facility]   PHYSICIAN:  Georga Hacking, M.D.DATE OF BIRTH:  1928-09-02   DATE OF ADMISSION:  06/24/2007  DATE OF DISCHARGE:                              HISTORY & PHYSICAL   REASON FOR ADMISSION:  Shortness of breath.   HISTORY:  The patient is a 77 year old male with complicated past  history.  He has a remote history of coronary artery disease with a  previous inferior infarction and coronary artery bypass grafting in 2001  with an internal mammary graft to the LAD, a vein graft to the  intermediate, a vein graft to the circumflex, a vein graft to the  posterolateral branch of the right coronary artery.  About a year ago,  he was hospitalized with weight loss and fatigue and was found to have  acute renal insufficiency.  During that admission, medications were  withdrawn, and he was found to have obstructive uropathy, was eventually  diagnosed with multiple myeloma.  He has been treated since that time by  Dr. Jeanette Caprice and is currently under therapy with Velcade,  intermittent dexamethasone,  and daily prednisone.  He was taken off  thalidomide in January.   In May of this year he developed abdominal pain and was found to have  diverticulitis and underwent resection of a part of the sigmoid colon by  Dr. Colin Benton.  He was also found to have some inflammation around his  ureters and at the time had bilateral ureteral stents placed.  He had  some urinary retention postoperatively.  He has had weight loss since  then and has had some failure to thrive over the summer.  He was treated  recently for C. difficile colitis and has recently been on some  antibiotics as well as cholestyramine.  He has been acutely short of  breath for about a week and has had severe dyspnea on exertion.  He then  developed some chest  discomfort last night and took 1 nitroglycerin.  He  has been severely dyspneic and saw Dr. Ronnald Nian physician assistant  this afternoon who found him to be severely short of breath and sent him  to outpatient radiology where he had an imaging study that showed a  right lower lobe pulmonary embolus.  He is brought to the emergency room  and is admitted at this time.   PAST MEDICAL HISTORY:  Is complex.  He has a known history of  hypertension and hyperlipidemia.  He was taken off Lipitor previously.  He has a history of anemia and has been diagnosed with myelodysplastic  syndrome but is currently receiving treatment for myeloma.  He has a  history of lumbar disk disease, a remote history of gout, and some renal  insufficiency.   PREVIOUS SURGICAL HISTORY:  1. He has a prior history of coronary artery bypass grafting in 1991.  2. Tonsillectomy as a child.  3. Previous hernia surgery.  4. Previous lumbar laminectomy.  5. Resection of  sigmoid colon.   ALLERGIES:  He is intolerant to MORPHINE.   CURRENT MEDICATIONS:  1. Coreg 3.125 mg daily.  2. Multivitamins daily.  3. Prednisone 10 mg daily.  4. Megace.  5. Cholestyramine one scoop b.i.d.  6. Flomax 0.4 mg nightly.  7. Ambien 12.5 mg nightly.  8. Remeron 15 mg nightly.  9. Amitriptyline nightly.  10.He is also on another medicine recently started to stimulate      appetite.   FAMILY HISTORY:  Father deceased at age 72 of a heart attack.  Mother  died at age 4 of arthritis.   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  He smoked a pack of cigarettes daily until 1986 at which time he  quit.  Has alcohol socially.   REVIEW OF SYSTEMS:  He has had a significant weight loss recently and  has very poor appetite and failure to thrive.  He has had some mild  memory loss as well as significant depression as a result.  He has no  eye, ear, nose, or throat complaints, no difficulty hearing, no   difficulty swallowing.  He does have history of chronic gastroesophageal  reflux disease.  He had recent treatment for C. difficile colitis.  He  has had urinary retention requiring Foley catheter drainage.  He has  also had bilateral ureteral stents placed.  He is seen by Dr. Earlene Plater.  He  is currently on Flomax.  He has some bone pain for which he will  occasionally take Dilaudid.  He had a fall where he fell and injured his  right side and bumped his head awhile back but has been feeling fine  with that.   PHYSICAL EXAMINATION:  GENERAL:  He is a pleasant male,  appearing his  stated age.  VITAL SIGNS:  Blood pressure 120/70, pulse 70 and regular.  SKIN:  Warm and dry.  Scattered ecchymoses noted.  ENT:  EOMI.  PERRLA.  Conjunctivae and sclerae clear.  Fundi not  examined.  Pharynx negative.  NECK:  Supple without masses.  There is no JVD, thyromegaly, or bruits.  LUNGS:  Clear bilaterally.  CARDIAC:  Normal S1 and S2.  No S3 or murmur.  ABDOMEN:  Soft, nontender.  Healed midline scar.  EXTREMITIES:  Femoral pulses 2+, distal pulses 2+.  Previous saphenous  harvesting scars noted.   EKG shows IV conduction delay, previous inferior infarction.   Chest x-ray was clear.   CT scan showed pulmonary embolus.   All labs are pending at the time of dictation.   IMPRESSION:  1. Acute pulmonary embolus.  2. Multiple myeloma, actively under treatment with Velcade and      steroids.  3. Coronary artery disease with previous inferior infarction and      previous coronary artery bypass grafting.  4. Remote history of hypertension.  5. Hyperlipidemia, not currently under treatment because of Lipitor      being stopped due to anorexia.  6. Weight loss.  7. History of urinary retention and ureteral obstruction.  8. History of renal failure.  9. Mild depression.  10.Recent Clostridium colitis.  11.History of diverticulitis with abscess with colon resection.  12.Lumbar disk disease with low  back pain.   RECOMMENDATIONS:  A very complex case.  He will be treated with  intravenous heparin and changed to Coumadin.  We will consult  hematology.  Lab will be reviewed.  Dr. Deborah Chalk will follow in the  hospital.  Georga Hacking, M.D.  Electronically Signed     WST/MEDQ  D:  06/24/2007  T:  06/24/2007  Job:  [id number]   cc:   Colleen Can. Deborah Chalk, M.D.  Valentino Hue. Magrinat, M.D.  Ronald L. Earlene Plater, M.D.

## 2011-04-04 NOTE — Discharge Summary (Signed)
NAME:  Timothy [name], Timothy [name] NO.:  [id number]   MEDICAL RECORD NO.:  [id number]        PATIENT TYPE:  LINP   LOCATION:                               FACILITY:  [facility]   PHYSICIAN:  Alfonse Ras, MD   DATE OF BIRTH:  [birth year]   DATE OF ADMISSION:  04/10/2007  DATE OF DISCHARGE:  04/18/2007                               DISCHARGE SUMMARY   __________.   CONDITION ON DISCHARGE:  Improved.   DISPOSITION:  Discharge to home.   MEDICATIONS:  Unchanged __________ medications.   FOLLOW UP:  Is with me in 2 weeks.   HOSPITAL COURSE:  The patient was admitted after home bowel prep on Apr 10, 2007 for sigmoid colectomy.  He underwent placement of ureteral  stent and underwent complicated sigmoid colectomy.  A primary  anastomosis was performed, which he tolerated well.  The patient had a  slightly-prolonged ileus and was slowly advanced on his diet from clear  liquids to full liquids.  Antibiotics were stopped on postoperative day  number 4.  PCA was stopped on postoperative day number 6.  He was  started on p.o. medications.  Regular diet was started on May 27.  Foley  had to be replaced for urinary retention.  This will be left in place.  Follow-up with Dr. Earlene Plater to have that removed.  He still has a very poor  appetite but was taking p.o. and was ready for discharge home on May 29.      Alfonse Ras, MD  Electronically Signed     KRE/MEDQ  D:  04/18/2007  T:  04/18/2007  Job:  [id number]

## 2011-04-05 ENCOUNTER — Encounter (HOSPITAL_BASED_OUTPATIENT_CLINIC_OR_DEPARTMENT_OTHER): Payer: Medicare Other | Admitting: Oncology

## 2011-04-05 ENCOUNTER — Other Ambulatory Visit: Payer: Self-pay | Admitting: Oncology

## 2011-04-05 DIAGNOSIS — Z86718 Personal history of other venous thrombosis and embolism: Secondary | ICD-10-CM

## 2011-04-05 DIAGNOSIS — R5381 Other malaise: Secondary | ICD-10-CM

## 2011-04-05 DIAGNOSIS — R972 Elevated prostate specific antigen [PSA]: Secondary | ICD-10-CM

## 2011-04-05 DIAGNOSIS — G609 Hereditary and idiopathic neuropathy, unspecified: Secondary | ICD-10-CM

## 2011-04-05 DIAGNOSIS — C9000 Multiple myeloma not having achieved remission: Secondary | ICD-10-CM

## 2011-04-05 LAB — CBC WITH DIFFERENTIAL/PLATELET
BASO%: 0.1 % (ref 0.0–2.0)
Eosinophils Absolute: 0 10*3/uL (ref 0.0–0.5)
HCT: 29.5 % — ABNORMAL LOW (ref 38.4–49.9)
MCHC: 34.9 g/dL (ref 32.0–36.0)
MONO#: 1.7 10*3/uL — ABNORMAL HIGH (ref 0.1–0.9)
NEUT#: 13.8 10*3/uL — ABNORMAL HIGH (ref 1.5–6.5)
NEUT%: 74.8 % (ref 39.0–75.0)
Platelets: 183 10*3/uL (ref 140–400)
WBC: 18.5 10*3/uL — ABNORMAL HIGH (ref 4.0–10.3)
lymph#: 2.9 10*3/uL (ref 0.9–3.3)
nRBC: 0 % (ref 0–0)

## 2011-04-06 LAB — COMPREHENSIVE METABOLIC PANEL
Albumin: 3.5 g/dL (ref 3.5–5.2)
Alkaline Phosphatase: 40 U/L (ref 39–117)
BUN: 30 mg/dL — ABNORMAL HIGH (ref 6–23)
CO2: 19 mEq/L (ref 19–32)
Calcium: 7.8 mg/dL — ABNORMAL LOW (ref 8.4–10.5)
Chloride: 92 mEq/L — ABNORMAL LOW (ref 96–112)
Glucose, Bld: 98 mg/dL (ref 70–99)
Potassium: 3.8 mEq/L (ref 3.5–5.3)
Sodium: 121 mEq/L — ABNORMAL LOW (ref 135–145)
Total Protein: 5.9 g/dL — ABNORMAL LOW (ref 6.0–8.3)

## 2011-04-06 LAB — IGG, IGA, IGM
IgA: 272 mg/dL (ref 68–378)
IgM, Serum: 55 mg/dL — ABNORMAL LOW (ref 60–263)

## 2011-04-06 LAB — KAPPA/LAMBDA LIGHT CHAINS: Kappa free light chain: 11.8 mg/dL — ABNORMAL HIGH (ref 0.33–1.94)

## 2011-04-07 LAB — UIFE/LIGHT CHAINS/TP QN, 24-HR UR
Alpha 1, Urine: DETECTED — AB
Free Kappa Lt Chains,Ur: 107 mg/dL — ABNORMAL HIGH (ref 0.04–1.51)
Free Kappa/Lambda Ratio: 130.49 ratio — ABNORMAL HIGH (ref 0.46–4.00)
Free Lambda Excretion/Day: 17.63 mg/d
Free Lambda Lt Chains,Ur: 0.82 mg/dL (ref 0.08–1.01)
Free Lt Chn Excr Rate: 2300.5 mg/d
Gamma Globulin, Urine: DETECTED — AB
Time: 24 hours
Total Protein, Urine: 108.7 mg/dL
Volume, Urine: 2150 mL

## 2011-04-07 NOTE — Cardiovascular Report (Signed)
. [facility]  Patient:    Timothy [name], Timothy [name]                 MRN: [id number] Proc. Date: 06/20/00 Adm. Date:  [id number] Disc. Date: [id number] Attending:  Eleanora Neighbor                        Cardiac Catheterization  REFERRING PHYSICIAN:  Feliciana Rossetti, M.D.  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 He has done well but has had progressive angina symptoms, mainly with exertion.  PROCEDURE PERFORMED:  Left heart catheterization with selective angiography, left ventricular angiography with Perclose.  TYPE AND SITE OF ENTRY:  Percutaneous right femoral artery with Perclose.  CATHETERS:  A 6 French 4 curved right and left coronary catheters, 6 French pigtail ventriculographic catheter.  CONTRAST MATERIAL:  Omnipaque.  MEDICATIONS GIVEN PRIOR TO THE PROCEDURE:  Valium 10 mg p.o.  MEDICATIONS GIVEN DURING THE PROCEDURE:  Versed 2 mg IV.  COMMENTS:  The patient tolerated the procedure well.  HEMODYNAMIC DATA:  The LV was 148/21, aortic pressure was 148/71. There was no aortic valve gradient noted on pullback.  ANGIOGRAPHIC DATA:  LEFT VENTRICULOGRAPHY:  Left ventricular angiogram was performed in the RAO position.  The overall cardiac size was somewhat enlarged.  There was inferior discrete akinesis in the inferior base.  The anterior and inferoapical wall contracted well.  There was no intracardiac calcification, intracavitary filling defect, or significant mitral regurgitation.  CORONARY ARTERIES:  The coronary arteries arise and distribute normally. 1. Right coronary artery:  The right coronary artery is totally occluded.    There is left to right collaterals to the distal right coronary artery, but    quite frankly the distal vessels are so small that it would be questionable    as to whether or not it would be bypassable. 2. Left main:  The left main coronary artery has mild distal  narrowing. 3. Left circumflex:  The left circumflex has a 90% narrowing.  The marginal    vessels off of the left circumflex are relatively small but would appear    to probably be suitable for bypass grafting. 4. Intermediate coronary:  The intermediate bifurcates.  There is 50-60%    proximal narrowing. 5. Left anterior descending:  The left anterior descending has a 90% ostial    narrowing.  The left anterior descending extends to the apex.  The distal    vessel would be suitable for bypass grafting.  OVERALL IMPRESSION: 1. Left ventricular ______ function. 2. Severe coronary disease (totally occluded right coronary artery, 90%    left circumflex, 50-60% intermediate, and 90-95% ostial left anterior    descending).  The patient will be referred for coronary artery bypass grafting. DD:  06/20/00 TD:  06/21/00 Job: [id number]

## 2011-04-07 NOTE — Consult Note (Signed)
NAMESACHA, RADLOFF NO.:  [id number]   MEDICAL RECORD NO.:  [id number]          PATIENT TYPE:  INP   LOCATION:  2907                         FACILITY:  [facility]   PHYSICIAN:  Blenda Nicely. Shadad        DATE OF BIRTH:  11/08/1928   DATE OF CONSULTATION:  DATE OF DISCHARGE:                                   CONSULTATION   REQUESTING PHYSICIAN:  Colleen Can. Deborah Chalk, M.D.   REASON FOR CONSULTATION:  Anemia.   HISTORY OF PRESENT ILLNESS:  A very pleasant 77 year old gentleman who I  have seen twice in the outpatient setting for evaluation of anemia.  I have  initially evaluated him on February 21, 2006, sent by Dr. Foy Guadalajara for evaluation  for microcytic anemia.  He has a history of myocardial infarction, in fact,  had surgery in 2001, but overall, he has been healthy.  It has been reported  that he had some decrease in his exercise tolerance over the last few years,  and his hemoglobin has dipped slowly from September, 2002 of 13.2 to 12.2 a  year later.  In 2005, it was 12.5, then 11.5 later that year.  In November,  2006, it was 10.5.  In March, 2007, it was 10.4.  His MCVs have also been  increasing rather slowly from 10.3 to most recently to 104.7.  His anemia  workup has been essentially negative.  His iron studies, B12, folate, all  have been negative.  I evaluated his erythropoietin level.  Also was normal  at 70.  In part of the workup, he had normal chemistries with a creatinine  of 1.5.  His blood work was repeated back on Apr 04, 2006, and his  hemoglobin at that time was also 9.5 with an MCV of 1.4 or so.  His white  cell count as well as platelet count was also normal.  We have talked to Mr.  Pro about obtaining a bone marrow biopsy to determine whether we are  dealing with an anemia of chronic disease versus early myelodysplastic  syndrome.  The patient was contemplating until about recently when he  developed what looks like URI-type symptoms, weakness, fatigue,  sore throat,  who presented to Dr. Ronnald Nian office, thinking that he might have a  cardiovascular etiology; however, it was noted that his creatinine was 8.6,  potassium 6.3 on a routine blood work.   Patient was admitted to [facility] on May 29th.  The patient was  seen in consultation by Dr. Camille Bal for nephrology.  His hyperkalemia  was treated with Kayexalate.  No urgent dialysis needed.  His workup,  including serum protein electrophoresis, urine protein electrophoresis, and  A-ANCA and B-ANCA is currently pending.  The patient is being evaluated for  a possible renal biopsy.  I am asked to comment on his anemia and whether it  is related to his current condition.   Clinically, Mr. Hehl reported he is feeling fairly well without any major  complaints.  His review of systems did not report any fevers, chills, night  sweats, appetite  changes, or weight loss.  Did not report any headaches,  blurry vision, double vision.  No motor or sensory neuropathy.  Does not  report any fevers or chills.  Does not report any chest pain, shortness of  breath, difficulty breathing.  No cough, hemoptysis, hematemesis.  No  nausea, vomiting, abdominal pain.  No hematochezia or melena.  No bleeding,  no clotting diathesis.  The rest of the review of systems is unremarkable.   PAST MEDICAL HISTORY:  Significant for coronary artery disease, GERD, gout,  back pain, status post CABG surgery in 2001 as well as L4-5 disk surgery.   FAMILY HISTORY:  He has a brother with prostate cancer.  Also, his mother  did not have any other malignancies.   SOCIAL HISTORY:  He is married.  He has a number of children.  No biological  grandchildren.  He is a retired International aid/development worker.  He smoked until the 1980s,  when he stopped at that time.  He still drinks about once or twice a week.  No other habitual drug abuse.   MEDICATIONS IN THE HOSPITAL:  Currently, he is on Coreg, Aranesp 200 mcg,  dextrose,  simvastatin, and IV fluids.   ALLERGIES:  None.   PHYSICAL EXAMINATION:  VITAL SIGNS:  Blood pressure today is 120/60, pulse  75, satting 100% on room air.  GENERAL:  An awake and alert gentleman not in any distress.  HEENT:  Head is normocephalic and atraumatic.  Pupils are equal, round and  reactive to light.  Mucous membranes moist and pink.  NECK:  No lymphadenopathy.  LUNGS:  Clear to auscultation and percussion.  HEART:  Regular rate and rhythm.  S1 and S2.  ABDOMEN:  Soft and nontender.  No hepatosplenomegaly.  No clubbing, cyanosis  or edema.  NEUROLOGIC:  Intact.   His blood work from today showed the following:  Hemoglobin 8.8, white cells  5.4, MCV 104, platelet count 395.  His differential was normal at 55%  neutrophils, 36% lymphs.  His bleeding time is currently elevated.  He was  on aspirin.  His INR is 0.9.  Chemistry showed potassium of 4.3.  It is down  from 6.3.  His creatinine is 7.2.  His calcium is 9.5.  TSH is 0.7.  Iron  studies are normal.  Vitamin B12 is normal.  Folate is normal.  His PSA is  elevated at 10.76.  His free lambda light chain in the urine was slightly  elevated.  His __________  antibody is negative.   ASSESSMENT/PLAN:  This is a very pleasant 77 year old gentleman with a  longstanding macrocytic anemia.  Differential diagnosis for that will  include a history of liver disease, B12/folate deficiency, and  myelodysplastic syndrome.  I have discussed with Mr. Burbach that in order to  make the diagnosis of myelodysplastic syndrome, a bone marrow biopsy is  needed; however, now he presents with unexplained acute renal failure with a  creatinine of 8 or so.  I doubt that the two entities are connected, and I  feel that a renal biopsy certainly is indicated at this point and certainly  agree with that.  I think a bone marrow biopsy will be required at some  point, but certainly it has to wait for the time being until his acute renal failure  etiology is determined;  however, for the time being, I would await the results of the serum protein  electrophoresis and the urine protein electrophoresis. Would recommend  supportive care like  you are doing and transfusion of packed red cells as  needed as the hemoglobin dips down to below 8.           ______________________________  Blenda Nicely. [facility]  Electronically Signed     FNS/MEDQ  D:  04/18/2006  T:  04/18/2006  Job:  [id number]   cc:   Colleen Can. Deborah Chalk, M.D.  Fax: [phone]   Doris Cheadle. Foy Guadalajara, M.D.  Fax: [phone]   Duke Salvia Eliott Nine, M.D.  Fax: [phone]

## 2011-04-20 ENCOUNTER — Ambulatory Visit (INDEPENDENT_AMBULATORY_CARE_PROVIDER_SITE_OTHER): Payer: Medicare Other | Admitting: Cardiology

## 2011-04-20 ENCOUNTER — Encounter: Payer: Self-pay | Admitting: Cardiology

## 2011-04-20 ENCOUNTER — Encounter: Payer: Self-pay | Admitting: *Deleted

## 2011-04-20 ENCOUNTER — Ambulatory Visit
Admission: RE | Admit: 2011-04-20 | Discharge: 2011-04-20 | Disposition: A | Discharge status: Home / Self Care | Payer: Medicare Other | Source: Ambulatory Visit | Attending: Cardiology | Admitting: Cardiology

## 2011-04-20 DIAGNOSIS — R0609 Other forms of dyspnea: Secondary | ICD-10-CM

## 2011-04-20 DIAGNOSIS — C9000 Multiple myeloma not having achieved remission: Secondary | ICD-10-CM

## 2011-04-20 DIAGNOSIS — R0600 Dyspnea, unspecified: Secondary | ICD-10-CM

## 2011-04-20 DIAGNOSIS — I255 Ischemic cardiomyopathy: Secondary | ICD-10-CM

## 2011-04-20 DIAGNOSIS — IMO0001 Reserved for inherently not codable concepts without codable children: Secondary | ICD-10-CM

## 2011-04-20 DIAGNOSIS — I2510 Atherosclerotic heart disease of native coronary artery without angina pectoris: Secondary | ICD-10-CM

## 2011-04-20 DIAGNOSIS — I10 Essential (primary) hypertension: Secondary | ICD-10-CM

## 2011-04-20 DIAGNOSIS — I2589 Other forms of chronic ischemic heart disease: Secondary | ICD-10-CM

## 2011-04-20 DIAGNOSIS — R0602 Shortness of breath: Secondary | ICD-10-CM

## 2011-04-20 DIAGNOSIS — N289 Disorder of kidney and ureter, unspecified: Secondary | ICD-10-CM

## 2011-04-20 LAB — CBC WITH DIFFERENTIAL/PLATELET
Basophils Absolute: 0 10*3/uL (ref 0.0–0.1)
HCT: 28.4 % — ABNORMAL LOW (ref 39.0–52.0)
Hemoglobin: 9.8 g/dL — ABNORMAL LOW (ref 13.0–17.0)
Lymphs Abs: 2.2 10*3/uL (ref 0.7–4.0)
MCV: 97.3 fl (ref 78.0–100.0)
Monocytes Absolute: 0.9 10*3/uL (ref 0.1–1.0)
Monocytes Relative: 8.8 % (ref 3.0–12.0)
Neutro Abs: 7.1 10*3/uL (ref 1.4–7.7)
Platelets: 214 10*3/uL (ref 150.0–400.0)
RDW: 14.8 % — ABNORMAL HIGH (ref 11.5–14.6)

## 2011-04-20 LAB — COMPREHENSIVE METABOLIC PANEL
ALT: 24 U/L (ref 0–53)
AST: 38 U/L — ABNORMAL HIGH (ref 0–37)
Albumin: 3.1 g/dL — ABNORMAL LOW (ref 3.5–5.2)
Alkaline Phosphatase: 41 U/L (ref 39–117)
Calcium: 8.8 mg/dL (ref 8.4–10.5)
Chloride: 102 mEq/L (ref 96–112)
Potassium: 5.6 mEq/L — ABNORMAL HIGH (ref 3.5–5.1)
Sodium: 132 mEq/L — ABNORMAL LOW (ref 135–145)
Total Protein: 6 g/dL (ref 6.0–8.3)

## 2011-04-21 ENCOUNTER — Encounter: Payer: Self-pay | Admitting: Cardiology

## 2011-04-21 DIAGNOSIS — N289 Disorder of kidney and ureter, unspecified: Secondary | ICD-10-CM | POA: Insufficient documentation

## 2011-04-21 DIAGNOSIS — IMO0001 Reserved for inherently not codable concepts without codable children: Secondary | ICD-10-CM | POA: Insufficient documentation

## 2011-04-21 DIAGNOSIS — I10 Essential (primary) hypertension: Secondary | ICD-10-CM | POA: Insufficient documentation

## 2011-04-21 DIAGNOSIS — C9000 Multiple myeloma not having achieved remission: Secondary | ICD-10-CM | POA: Insufficient documentation

## 2011-04-21 DIAGNOSIS — I255 Ischemic cardiomyopathy: Secondary | ICD-10-CM | POA: Insufficient documentation

## 2011-04-21 NOTE — Assessment & Plan Note (Signed)
He is concerned that he has recurrent ischemic heart disease. He has multiple issues that could explain his shortness of breath and we will need to repeat a LexiScan Myoview and also get a chest x-ray. We'll arrange for C. Met, CBC, BNP, and TSH are seen back in one week.  He had a 2-D echocardiogram in December 2011 which showed an EF of 30-35% with moderate left atrial dilatation and mild mitral regurgitation and mild increase in pulmonary artery pressure.

## 2011-04-21 NOTE — Progress Notes (Signed)
Subjective:   Timothy [name] comes in today for followup visit. He thinks he is more short of breath. He is tiring easier. He has not had any chest pain. He's having low back discomfort that is limiting his ability to be active.  He has a history of ischemic heart myopathy as well as multiple myeloma with history of increasing proteinuria. He has hypertension, renal insufficiency, and peripheral neuropathy. His echocardiogram in December of 2011 showed an ejection fraction of 30-35% with inferior lateral hypokinesia. He has had some degree of anemia and has been persistent.  He is concerned that he has more coronary obstruction and as I explained to him, I'm concerned that any use of contrast would damage his kidneys. He has had a history pulmonary emboli as well in the past.  Current Outpatient Prescriptions  Medication Sig Dispense Refill  . acetaminophen (TYLENOL) 500 MG tablet Take 500 mg by mouth as needed.        Marland Kitchen allopurinol (ZYLOPRIM) 150 mg TABS Take 150 mg by mouth daily.        Marland Kitchen aspirin 81 MG tablet Take 81 mg by mouth daily.        Marland Kitchen atorvastatin (LIPITOR) 20 MG tablet Take 20 mg by mouth daily.        . B Complex-Biotin-FA (B COMPLETE PO) Take 1 tablet by mouth daily.        . carvedilol (COREG) 3.125 MG tablet Take 1 tablet by mouth Twice daily.      . Coenzyme Q10 (CO Q-10 PO) Take 1 tablet by mouth daily.        Marland Kitchen dexamethasone (DECADRON) 4 MG tablet as directed.      Marland Kitchen HYDROmorphone (DILAUDID) 4 MG tablet Take 1 tablet by mouth daily.      Marland Kitchen lisinopril (PRINIVIL,ZESTRIL) 20 MG tablet Take 1 tablet by mouth Daily.        . multivitamin (THERAGRAN) per tablet Take 1 tablet by mouth daily.        . Omega-3 Fatty Acids (FISH OIL PO) Take 1 capsule by mouth 2 (two) times daily.        Marland Kitchen triamterene-hydrochlorothiazide (DYAZIDE) 37.5-25 MG per capsule Take 1 tablet by mouth Daily.      Marland Kitchen DISCONTD: Zoledronic Acid (ZOMETA IV) Inject into the vein every 30 (thirty) days.          No Known  Allergies  There is no problem list on file for this patient.   History  Smoking status  . Former Smoker -- 1.0 packs/day for 20 years  . Types: Cigarettes  . Quit date: 11/20/1984  Smokeless tobacco  . Never Used    History  Alcohol Use No    No family history on file.  Review of Systems:   The patient denies any heat or cold intolerance.  No weight gain or weight loss.  The patient denies headaches or blurry vision.  There is no cough or sputum production.  The patient denies dizziness.  There is no hematuria or hematochezia.  The patient denies any muscle aches or arthritis.  The patient denies any rash.  The patient denies frequent falling or instability.  There is no history of depression or anxiety.  All other systems were reviewed and are negative.   Physical Exam:   Today his weights 178. Blood pressure 108 and 52 sitting, heart rate 60 and regular.The head is normocephalic and atraumatic.  Pupils are equally round and reactive to light.  Sclerae nonicteric.  Conjunctiva is clear.  Oropharynx is unremarkable.  There's adequate oral airway.  Neck is supple there are no masses.  Thyroid is not enlarged.  There is no lymphadenopathy.  Lungs are clear.  Chest is symmetric.  Heart shows a regular rate and rhythm.  S1 and S2 are normal.  There is no murmur click or gallop.  Abdomen is soft normal bowel sounds.  There is no organomegaly.  Genital and rectal deferred.  Extremities are without edema.  Peripheral pulses are adequate.  Neurologically intact.  Full range of motion.  The patient is not depressed.  Skin is warm and dry.  Assessment / Plan:

## 2011-04-25 ENCOUNTER — Ambulatory Visit (HOSPITAL_COMMUNITY): Payer: Medicare Other | Attending: Cardiology | Admitting: Radiology

## 2011-04-25 DIAGNOSIS — R0609 Other forms of dyspnea: Secondary | ICD-10-CM | POA: Insufficient documentation

## 2011-04-25 DIAGNOSIS — I2510 Atherosclerotic heart disease of native coronary artery without angina pectoris: Secondary | ICD-10-CM

## 2011-04-25 DIAGNOSIS — I25810 Atherosclerosis of coronary artery bypass graft(s) without angina pectoris: Secondary | ICD-10-CM

## 2011-04-25 DIAGNOSIS — R0600 Dyspnea, unspecified: Secondary | ICD-10-CM

## 2011-04-25 DIAGNOSIS — R0989 Other specified symptoms and signs involving the circulatory and respiratory systems: Secondary | ICD-10-CM | POA: Insufficient documentation

## 2011-04-25 MED ORDER — REGADENOSON 0.4 MG/5ML IV SOLN
0.4000 mg | Freq: Once | INTRAVENOUS | Status: AC
  Administered 2011-04-25: 0.4 mg via INTRAVENOUS

## 2011-04-25 MED ORDER — TECHNETIUM TC 99M TETROFOSMIN IV KIT
11.0000 | PACK | Freq: Once | INTRAVENOUS | Status: AC | PRN
  Administered 2011-04-25: 11 via INTRAVENOUS

## 2011-04-25 MED ORDER — TECHNETIUM TC 99M TETROFOSMIN IV KIT
33.0000 | PACK | Freq: Once | INTRAVENOUS | Status: AC | PRN
  Administered 2011-04-25: 33 via INTRAVENOUS

## 2011-04-25 NOTE — Progress Notes (Signed)
[facility] SITE 3 NUCLEAR MED [address] [place] [address] [phone]  Cardiology Nuclear Med Study  Timothy [name] is a 77 y.o. male [id number] 25-May-1928   Nuclear Med Background Indication for Stress Test:  Evaluation for Ischemia and Graft Patency History:  '86 IWMI, '01 CABG, 1/11 VHQ:IONGEXBM scar, no ischemia, EF=39%, 12/11 Echo:EF=30-35%, mild MR Cardiac Risk Factors: History of Smoking, Hypertension and Lipids  Symptoms:  DOE and Fatigue   Nuclear Pre-Procedure Caffeine/Decaff Intake:  None NPO After: 6:00am   Lungs:  Clear.  O2 sat 99% on RA. IV 0.9% NS with Angio Cath:  20g  IV Site: R Antecubital  IV Started by:  Irean Hong, RN  Chest Size (in):  44 Cup Size: n/a  Height: 5\' 11"  (1.803 m)  Weight:  172 lb (78.019 kg)  BMI:  Body mass index is 23.99 kg/(m^2). Tech Comments:  Held coreg this am    Nuclear Med Study 1 or 2 day study: 1 day  Stress Test Type:  Lexiscan  Reading MD: P.WUXLKG  Order Authorizing Provider:  Roger Shelter, MD  Resting Radionuclide: Technetium 36m Tetrofosmin  Resting Radionuclide Dose: 11.0 mCi   Stress Radionuclide:  Technetium 7m Tetrofosmin  Stress Radionuclide Dose: 33.0 mCi           Stress Protocol Rest HR: 49 Stress HR: 97  Rest BP: 111/66 Stress BP: 129/56  Exercise Time (min): n/a METS: n/a   Predicted Max HR: 140 bpm % Max HR: 69.29 bpm Rate Pressure Product: 40102   Dose of Adenosine (mg):  n/a Dose of Lexiscan: 0.4 mg  Dose of Atropine (mg): n/a Dose of Dobutamine: n/a mcg/kg/min (at max HR)  Stress Test Technologist: Smiley Houseman, CMA-N  Nuclear Technologist:  Doyne Keel, CNMT     Rest Procedure:  Myocardial perfusion imaging was performed at rest 45 minutes following the intravenous administration of Technetium 92m Tetrofosmin.  Rest ECG: No acute changes, rare PVC.  Stress Procedure:  The patient received IV Lexiscan 0.4 mg over 15-seconds.  Technetium 109m Tetrofosmin  injected at 30-seconds.  There were no significant changes with Lexiscan, other than occasional PVC's.  Quantitative spect images were obtained after a 45 minute delay.  Stress ECG: No significant change from baseline ECG  QPS Raw Data Images:  Normal; no motion artifact; normal heart/lung ratio. Stress Images:  There is decreased uptake in the inferior wall. Rest Images:  There is decreased uptake in the inferior wall. Subtraction (SDS):  There is a fixed defect that is most consistent with a previous infarction. Transient Ischemic Dilatation (Normal <1.22):  1.08 Lung/Heart Ratio (Normal <0.45):  0.34  Quantitative Gated Spect Images QGS EDV:  152 ml QGS ESV:  93 ml QGS cine images:  Inferior wall hypokinesis QGS EF: 39%  Impression Exercise Capacity:  Lexiscan with no exercise. BP Response:  Normal blood pressure response. Clinical Symptoms:  No chest pain. ECG Impression:  No significant ST segment change suggestive of ischemia. Comparison with Prior Nuclear Study: No images to compare  Overall Impression:  Large inferior wall infarct at mid and basal level with no ischemia.  Decreased EF 39%       Charlton Haws

## 2011-04-26 ENCOUNTER — Telehealth: Payer: Self-pay | Admitting: *Deleted

## 2011-04-26 NOTE — Telephone Encounter (Signed)
Message copied by Lorayne Bender on Wed Apr 26, 2011 11:27 AM ------      Message from: Roger Shelter      Created: Wed Apr 26, 2011  8:40 AM       Cut back potassium.  Send CBC to oncologist.

## 2011-04-26 NOTE — Progress Notes (Signed)
Normal results given

## 2011-04-26 NOTE — Telephone Encounter (Signed)
Pt notified of lab results and was instructed to cut back on potassium in his diet.  Pt will f/u with Dr. Darnelle Catalan next week for office visit.  RN faxed labs to Dr. Darnelle Catalan.

## 2011-04-26 NOTE — Progress Notes (Signed)
COPY ROUTED TO DR. Deborah Chalk.Scarlette Ar

## 2011-04-26 NOTE — Telephone Encounter (Signed)
Message copied by Adolphus Birchwood on Wed Apr 26, 2011  5:09 PM ------      Message from: Roger Shelter      Created: Wed Apr 26, 2011  8:40 AM       Cut back potassium.  Send CBC to oncologist.

## 2011-04-26 NOTE — Telephone Encounter (Signed)
Message copied by Adolphus Birchwood on Wed Apr 26, 2011  5:12 PM ------      Message from: Roger Shelter      Created: Wed Apr 26, 2011  8:40 AM       Cut back potassium.  Send CBC to oncologist.

## 2011-04-28 ENCOUNTER — Ambulatory Visit (INDEPENDENT_AMBULATORY_CARE_PROVIDER_SITE_OTHER): Payer: Medicare Other | Admitting: Cardiology

## 2011-04-28 ENCOUNTER — Encounter: Payer: Self-pay | Admitting: Cardiology

## 2011-04-28 DIAGNOSIS — C9000 Multiple myeloma not having achieved remission: Secondary | ICD-10-CM

## 2011-04-28 DIAGNOSIS — I2589 Other forms of chronic ischemic heart disease: Secondary | ICD-10-CM

## 2011-04-28 DIAGNOSIS — I255 Ischemic cardiomyopathy: Secondary | ICD-10-CM

## 2011-04-30 ENCOUNTER — Encounter: Payer: Self-pay | Admitting: Cardiology

## 2011-04-30 NOTE — Assessment & Plan Note (Signed)
He is beginning to have some recurrence light chains. [facility].

## 2011-04-30 NOTE — Progress Notes (Signed)
Subjective:   Timothy [name] comes in today for followup visit. He had a stress Cardiolite as showed an ejection fraction of 39% with a large inferior wall infarction but with no ischemia. Overall, he's feeling somewhat better with less shortness of breath and perhaps more energy level. He still complaining of low back pain and that really is the main limiting factor for him. He continues to have some numbness and cramping in his legs. He is not having chest pain. He does have fatigue and some degree of chronic shortness of breath. Overall, I think he is improved.  Current Outpatient Prescriptions  Medication Sig Dispense Refill  . acetaminophen (TYLENOL) 500 MG tablet Take 500 mg by mouth as needed.        Marland Kitchen allopurinol (ZYLOPRIM) 150 mg TABS Take 150 mg by mouth daily.        Marland Kitchen aspirin 81 MG tablet Take 81 mg by mouth daily.        Marland Kitchen atorvastatin (LIPITOR) 20 MG tablet Take 20 mg by mouth daily.        . B Complex-Biotin-FA (B COMPLETE PO) Take 1 tablet by mouth daily.        . carvedilol (COREG) 3.125 MG tablet Take 1 tablet by mouth Twice daily.      . Coenzyme Q10 (CO Q-10 PO) Take 1 tablet by mouth daily.        Marland Kitchen dexamethasone (DECADRON) 4 MG tablet as directed.      Marland Kitchen HYDROmorphone (DILAUDID) 4 MG tablet Take 1 tablet by mouth daily.      Marland Kitchen lisinopril (PRINIVIL,ZESTRIL) 20 MG tablet Take 1 tablet by mouth Daily.        . multivitamin (THERAGRAN) per tablet Take 1 tablet by mouth daily.        . Omega-3 Fatty Acids (FISH OIL PO) Take 1 capsule by mouth 2 (two) times daily.        Marland Kitchen triamterene-hydrochlorothiazide (DYAZIDE) 37.5-25 MG per capsule Take 1 tablet by mouth Daily.      Marland Kitchen DISCONTD: Zoledronic Acid (ZOMETA IV) Inject into the vein every 30 (thirty) days.          No Known Allergies  Patient Active Problem List  Diagnoses  . Ischemic cardiomyopathy  . Multiple myeloma  . Shortness of breath dyspnea  . Hypertension  . Renal insufficiency    History  Smoking status  . Former  Smoker -- 1.0 packs/day for 20 years  . Types: Cigarettes  . Quit date: 11/20/1984  Smokeless tobacco  . Never Used    History  Alcohol Use No    No family history on file.  Review of Systems:   The patient denies any heat or cold intolerance.  No weight gain or weight loss.  The patient denies headaches or blurry vision.  There is no cough or sputum production.  The patient denies dizziness.  There is no hematuria or hematochezia.  The patient denies any muscle aches or arthritis.  The patient denies any rash.  The patient denies frequent falling or instability.  There is no history of depression or anxiety.  All other systems were reviewed and are negative.   Physical Exam:   Weight is 176. Blood pressure is 122/64. Heart rate 60.The head is normocephalic and atraumatic.  Pupils are equally round and reactive to light.  Sclerae nonicteric.  Conjunctiva is clear.  Oropharynx is unremarkable.  There's adequate oral airway.  Neck is supple there are no masses.  Thyroid is  not enlarged.  There is no lymphadenopathy.  Lungs are clear.  Chest is symmetric.  Heart shows a regular rate and rhythm.  S1 and S2 are normal.  There is no murmur click or gallop.  Abdomen is soft normal bowel sounds.  There is no organomegaly.  Genital and rectal deferred.  Extremities are without edema.  Peripheral pulses are adequate.  Neurologically intact.  Full range of motion.  The patient is not depressed.  Skin is warm and dry.  Assessment / Plan:

## 2011-04-30 NOTE — Assessment & Plan Note (Signed)
Overall, it is doing well. I'll have him see Dr. Swaziland in approximately 10 weeks. He has seen Lawson Fiscal on multiple occasions and in general, his cardiology management has been satisfactory.

## 2011-05-01 ENCOUNTER — Ambulatory Visit: Payer: Medicare Other | Admitting: Cardiology

## 2011-05-01 ENCOUNTER — Other Ambulatory Visit: Payer: Self-pay | Admitting: Oncology

## 2011-05-01 NOTE — Progress Notes (Signed)
Stable findings

## 2011-05-02 NOTE — Progress Notes (Signed)
Reviewed with pt at 04/28/11 office visit.

## 2011-05-03 ENCOUNTER — Encounter (HOSPITAL_BASED_OUTPATIENT_CLINIC_OR_DEPARTMENT_OTHER): Payer: Medicare Other | Admitting: Oncology

## 2011-05-03 ENCOUNTER — Other Ambulatory Visit: Payer: Self-pay | Admitting: Oncology

## 2011-05-03 DIAGNOSIS — R972 Elevated prostate specific antigen [PSA]: Secondary | ICD-10-CM

## 2011-05-03 DIAGNOSIS — G609 Hereditary and idiopathic neuropathy, unspecified: Secondary | ICD-10-CM

## 2011-05-03 DIAGNOSIS — C9000 Multiple myeloma not having achieved remission: Secondary | ICD-10-CM

## 2011-05-03 DIAGNOSIS — Z86718 Personal history of other venous thrombosis and embolism: Secondary | ICD-10-CM

## 2011-05-03 LAB — CBC WITH DIFFERENTIAL/PLATELET
Basophils Absolute: 0 10*3/uL (ref 0.0–0.1)
EOS%: 3.3 % (ref 0.0–7.0)
Eosinophils Absolute: 0.3 10*3/uL (ref 0.0–0.5)
HCT: 27.4 % — ABNORMAL LOW (ref 38.4–49.9)
HGB: 9.3 g/dL — ABNORMAL LOW (ref 13.0–17.1)
LYMPH%: 18.5 % (ref 14.0–49.0)
MCH: 32.6 pg (ref 27.2–33.4)
MCV: 96.2 fL (ref 79.3–98.0)
MONO%: 10.9 % (ref 0.0–14.0)
NEUT#: 6.5 10*3/uL (ref 1.5–6.5)
NEUT%: 67.2 % (ref 39.0–75.0)
Platelets: 200 10*3/uL (ref 140–400)
RDW: 14.8 % — ABNORMAL HIGH (ref 11.0–14.6)

## 2011-05-03 LAB — UIFE/LIGHT CHAINS/TP QN, 24-HR UR
Albumin, U: DETECTED
Alpha 1, Urine: DETECTED — AB
Alpha 2, Urine: DETECTED — AB
Free Kappa Lt Chains,Ur: 87.1 mg/dL — ABNORMAL HIGH (ref 0.04–1.51)
Gamma Globulin, Urine: DETECTED — AB

## 2011-05-04 LAB — COMPREHENSIVE METABOLIC PANEL
ALT: 17 U/L (ref 0–53)
Albumin: 3.9 g/dL (ref 3.5–5.2)
CO2: 21 mEq/L (ref 19–32)
Calcium: 9.2 mg/dL (ref 8.4–10.5)
Chloride: 106 mEq/L (ref 96–112)
Glucose, Bld: 104 mg/dL — ABNORMAL HIGH (ref 70–99)
Sodium: 139 mEq/L (ref 135–145)
Total Protein: 6.6 g/dL (ref 6.0–8.3)

## 2011-05-04 LAB — KAPPA/LAMBDA LIGHT CHAINS: Kappa free light chain: 35.6 mg/dL — ABNORMAL HIGH (ref 0.33–1.94)

## 2011-05-29 ENCOUNTER — Other Ambulatory Visit: Payer: Self-pay | Admitting: Oncology

## 2011-05-31 ENCOUNTER — Encounter (HOSPITAL_BASED_OUTPATIENT_CLINIC_OR_DEPARTMENT_OTHER): Payer: Medicare Other | Admitting: Oncology

## 2011-05-31 ENCOUNTER — Other Ambulatory Visit: Payer: Self-pay | Admitting: Oncology

## 2011-05-31 DIAGNOSIS — G609 Hereditary and idiopathic neuropathy, unspecified: Secondary | ICD-10-CM

## 2011-05-31 DIAGNOSIS — C9000 Multiple myeloma not having achieved remission: Secondary | ICD-10-CM

## 2011-05-31 DIAGNOSIS — R972 Elevated prostate specific antigen [PSA]: Secondary | ICD-10-CM

## 2011-05-31 DIAGNOSIS — M549 Dorsalgia, unspecified: Secondary | ICD-10-CM

## 2011-05-31 DIAGNOSIS — Z5111 Encounter for antineoplastic chemotherapy: Secondary | ICD-10-CM

## 2011-05-31 DIAGNOSIS — R21 Rash and other nonspecific skin eruption: Secondary | ICD-10-CM

## 2011-05-31 DIAGNOSIS — Z86718 Personal history of other venous thrombosis and embolism: Secondary | ICD-10-CM

## 2011-05-31 LAB — CBC WITH DIFFERENTIAL/PLATELET
Basophils Absolute: 0 10*3/uL (ref 0.0–0.1)
Eosinophils Absolute: 0.4 10*3/uL (ref 0.0–0.5)
HCT: 27.3 % — ABNORMAL LOW (ref 38.4–49.9)
HGB: 9 g/dL — ABNORMAL LOW (ref 13.0–17.1)
LYMPH%: 26.1 % (ref 14.0–49.0)
MCV: 94.8 fL (ref 79.3–98.0)
MONO%: 13.3 % (ref 0.0–14.0)
NEUT#: 5.4 10*3/uL (ref 1.5–6.5)
NEUT%: 55.6 % (ref 39.0–75.0)
Platelets: 196 10*3/uL (ref 140–400)
RDW: 14.9 % — ABNORMAL HIGH (ref 11.0–14.6)

## 2011-05-31 LAB — COMPREHENSIVE METABOLIC PANEL
ALT: 18 U/L (ref 0–53)
AST: 32 U/L (ref 0–37)
Alkaline Phosphatase: 50 U/L (ref 39–117)
CO2: 22 mEq/L (ref 19–32)
Sodium: 131 mEq/L — ABNORMAL LOW (ref 135–145)
Total Bilirubin: 0.5 mg/dL (ref 0.3–1.2)
Total Protein: 7.4 g/dL (ref 6.0–8.3)

## 2011-05-31 LAB — UIFE/LIGHT CHAINS/TP QN, 24-HR UR
Albumin, U: DETECTED
Beta, Urine: DETECTED — AB
Free Kappa/Lambda Ratio: 131.82 ratio — ABNORMAL HIGH (ref 2.04–10.37)
Free Lambda Lt Chains,Ur: 1.32 mg/dL — ABNORMAL HIGH (ref 0.02–0.67)
Total Protein, Urine-Ur/day: 3784 mg/d — ABNORMAL HIGH (ref 10–140)
Total Protein, Urine: 176 mg/dL
Volume, Urine: 2150 mL

## 2011-06-01 LAB — KAPPA/LAMBDA LIGHT CHAINS: Kappa free light chain: 37.2 mg/dL — ABNORMAL HIGH (ref 0.33–1.94)

## 2011-06-01 LAB — IGG, IGA, IGM
IgA: 267 mg/dL (ref 68–379)
IgG (Immunoglobin G), Serum: 841 mg/dL (ref 650–1600)
IgM, Serum: 79 mg/dL (ref 41–251)

## 2011-06-07 ENCOUNTER — Other Ambulatory Visit: Payer: Self-pay | Admitting: Oncology

## 2011-06-07 ENCOUNTER — Encounter (HOSPITAL_COMMUNITY)
Admission: RE | Admit: 2011-06-07 | Discharge: 2011-06-07 | Disposition: A | Discharge status: Home / Self Care | Payer: Medicare Other | Source: Ambulatory Visit | Attending: Oncology | Admitting: Oncology

## 2011-06-07 ENCOUNTER — Encounter (HOSPITAL_BASED_OUTPATIENT_CLINIC_OR_DEPARTMENT_OTHER): Payer: Medicare Other | Admitting: Oncology

## 2011-06-07 DIAGNOSIS — G609 Hereditary and idiopathic neuropathy, unspecified: Secondary | ICD-10-CM

## 2011-06-07 DIAGNOSIS — Z5111 Encounter for antineoplastic chemotherapy: Secondary | ICD-10-CM

## 2011-06-07 DIAGNOSIS — D649 Anemia, unspecified: Secondary | ICD-10-CM | POA: Insufficient documentation

## 2011-06-07 DIAGNOSIS — R972 Elevated prostate specific antigen [PSA]: Secondary | ICD-10-CM

## 2011-06-07 DIAGNOSIS — C9000 Multiple myeloma not having achieved remission: Secondary | ICD-10-CM

## 2011-06-07 DIAGNOSIS — Z86718 Personal history of other venous thrombosis and embolism: Secondary | ICD-10-CM

## 2011-06-07 LAB — BASIC METABOLIC PANEL
CO2: 22 mEq/L (ref 19–32)
Calcium: 8.9 mg/dL (ref 8.4–10.5)
Creatinine, Ser: 1.35 mg/dL (ref 0.50–1.35)
Glucose, Bld: 108 mg/dL — ABNORMAL HIGH (ref 70–99)

## 2011-06-07 LAB — CBC WITH DIFFERENTIAL/PLATELET
EOS%: 0.7 % (ref 0.0–7.0)
Eosinophils Absolute: 0 10*3/uL (ref 0.0–0.5)
HGB: 8.6 g/dL — ABNORMAL LOW (ref 13.0–17.1)
MCV: 97.8 fL (ref 79.3–98.0)
MONO%: 11.8 % (ref 0.0–14.0)
NEUT#: 3.9 10*3/uL (ref 1.5–6.5)
RBC: 2.62 10*6/uL — ABNORMAL LOW (ref 4.20–5.82)
RDW: 16 % — ABNORMAL HIGH (ref 11.0–14.6)
lymph#: 1.7 10*3/uL (ref 0.9–3.3)

## 2011-06-07 LAB — HOLD TUBE, BLOOD BANK

## 2011-06-08 LAB — TYPE & CROSSMATCH - CHCC

## 2011-06-09 ENCOUNTER — Encounter (HOSPITAL_BASED_OUTPATIENT_CLINIC_OR_DEPARTMENT_OTHER): Payer: Medicare Other | Admitting: Oncology

## 2011-06-09 DIAGNOSIS — C9000 Multiple myeloma not having achieved remission: Secondary | ICD-10-CM

## 2011-06-10 LAB — CROSSMATCH
ABO/RH(D): B NEG
Antibody Screen: NEGATIVE
Unit division: 0

## 2011-06-13 ENCOUNTER — Other Ambulatory Visit: Payer: Self-pay | Admitting: Cardiology

## 2011-06-14 ENCOUNTER — Encounter (HOSPITAL_BASED_OUTPATIENT_CLINIC_OR_DEPARTMENT_OTHER): Payer: Medicare Other | Admitting: Oncology

## 2011-06-14 ENCOUNTER — Other Ambulatory Visit: Payer: Self-pay | Admitting: Oncology

## 2011-06-14 DIAGNOSIS — C9000 Multiple myeloma not having achieved remission: Secondary | ICD-10-CM

## 2011-06-14 DIAGNOSIS — Z86718 Personal history of other venous thrombosis and embolism: Secondary | ICD-10-CM

## 2011-06-14 DIAGNOSIS — R972 Elevated prostate specific antigen [PSA]: Secondary | ICD-10-CM

## 2011-06-14 DIAGNOSIS — G609 Hereditary and idiopathic neuropathy, unspecified: Secondary | ICD-10-CM

## 2011-06-14 LAB — CBC & DIFF AND RETIC
BASO%: 0.6 % (ref 0.0–2.0)
Immature Retic Fract: 2.7 % (ref 0.00–13.40)
MCHC: 33.6 g/dL (ref 32.0–36.0)
MONO#: 0.8 10*3/uL (ref 0.1–0.9)
NEUT#: 4.3 10*3/uL (ref 1.5–6.5)
RBC: 3.46 10*6/uL — ABNORMAL LOW (ref 4.20–5.82)
Retic %: 1.93 % — ABNORMAL HIGH (ref 0.50–1.60)
Retic Ct Abs: 66.78 10*3/uL (ref 24.10–77.50)
WBC: 7.2 10*3/uL (ref 4.0–10.3)
lymph#: 2 10*3/uL (ref 0.9–3.3)
nRBC: 0 % (ref 0–0)

## 2011-06-14 LAB — COMPREHENSIVE METABOLIC PANEL
ALT: 16 U/L (ref 0–53)
AST: 29 U/L (ref 0–37)
Alkaline Phosphatase: 53 U/L (ref 39–117)
CO2: 24 mEq/L (ref 19–32)
Creatinine, Ser: 1.12 mg/dL (ref 0.50–1.35)
Total Bilirubin: 0.5 mg/dL (ref 0.3–1.2)

## 2011-06-14 NOTE — Telephone Encounter (Signed)
escribe medication per fax request  

## 2011-06-16 LAB — PROTEIN ELECTROPHORESIS, SERUM
Albumin ELP: 56.3 % (ref 55.8–66.1)
Alpha-2-Globulin: 13.6 % — ABNORMAL HIGH (ref 7.1–11.8)
Beta 2: 5.9 % (ref 3.2–6.5)
Beta Globulin: 6.2 % (ref 4.7–7.2)

## 2011-06-16 LAB — KAPPA/LAMBDA LIGHT CHAINS: Kappa free light chain: 37.5 mg/dL — ABNORMAL HIGH (ref 0.33–1.94)

## 2011-06-19 ENCOUNTER — Other Ambulatory Visit: Payer: Self-pay | Admitting: Oncology

## 2011-06-21 LAB — UIFE/LIGHT CHAINS/TP QN, 24-HR UR
Albumin, U: DETECTED
Alpha 1, Urine: DETECTED — AB
Alpha 2, Urine: DETECTED — AB
Free Kappa Lt Chains,Ur: 134 mg/dL — ABNORMAL HIGH (ref 0.14–2.42)
Free Kappa/Lambda Ratio: 181.08 ratio — ABNORMAL HIGH (ref 2.04–10.37)
Free Lambda Excretion/Day: 19.98 mg/d
Total Protein, Urine-Ur/day: 3656 mg/d — ABNORMAL HIGH (ref 10–140)
Total Protein, Urine: 135.4 mg/dL

## 2011-06-23 ENCOUNTER — Ambulatory Visit: Payer: Medicare Other | Admitting: Nurse Practitioner

## 2011-06-28 ENCOUNTER — Other Ambulatory Visit: Payer: Self-pay | Admitting: Oncology

## 2011-06-28 ENCOUNTER — Encounter (HOSPITAL_BASED_OUTPATIENT_CLINIC_OR_DEPARTMENT_OTHER): Payer: Medicare Other | Admitting: Oncology

## 2011-06-28 DIAGNOSIS — R972 Elevated prostate specific antigen [PSA]: Secondary | ICD-10-CM

## 2011-06-28 DIAGNOSIS — C9000 Multiple myeloma not having achieved remission: Secondary | ICD-10-CM

## 2011-06-28 DIAGNOSIS — Z5112 Encounter for antineoplastic immunotherapy: Secondary | ICD-10-CM

## 2011-06-28 DIAGNOSIS — Z86718 Personal history of other venous thrombosis and embolism: Secondary | ICD-10-CM

## 2011-06-28 DIAGNOSIS — G609 Hereditary and idiopathic neuropathy, unspecified: Secondary | ICD-10-CM

## 2011-06-28 LAB — CBC WITH DIFFERENTIAL/PLATELET
BASO%: 0.6 % (ref 0.0–2.0)
EOS%: 0.7 % (ref 0.0–7.0)
MCH: 32.3 pg (ref 27.2–33.4)
MCHC: 34 g/dL (ref 32.0–36.0)
MCV: 95.1 fL (ref 79.3–98.0)
MONO%: 12 % (ref 0.0–14.0)
RBC: 3.47 10*6/uL — ABNORMAL LOW (ref 4.20–5.82)
RDW: 15.3 % — ABNORMAL HIGH (ref 11.0–14.6)

## 2011-06-30 LAB — COMPREHENSIVE METABOLIC PANEL
ALT: 13 U/L (ref 0–53)
AST: 25 U/L (ref 0–37)
Albumin: 3.9 g/dL (ref 3.5–5.2)
Alkaline Phosphatase: 39 U/L (ref 39–117)
BUN: 22 mg/dL (ref 6–23)
Chloride: 104 mEq/L (ref 96–112)
Creatinine, Ser: 1.28 mg/dL (ref 0.50–1.35)
Potassium: 5 mEq/L (ref 3.5–5.3)

## 2011-06-30 LAB — RETICULOCYTES (CHCC)
RBC.: 3.57 MIL/uL — ABNORMAL LOW (ref 4.22–5.81)
Retic Ct Pct: 1.8 % (ref 0.4–2.3)

## 2011-06-30 LAB — PROTEIN ELECTROPHORESIS, SERUM
Alpha-1-Globulin: 5 % — ABNORMAL HIGH (ref 2.9–4.9)
Alpha-2-Globulin: 12.9 % — ABNORMAL HIGH (ref 7.1–11.8)
Beta Globulin: 6.1 % (ref 4.7–7.2)
Total Protein, Serum Electrophoresis: 6.1 g/dL (ref 6.0–8.3)

## 2011-06-30 LAB — KAPPA/LAMBDA LIGHT CHAINS: Kappa:Lambda Ratio: 16.42 — ABNORMAL HIGH (ref 0.26–1.65)

## 2011-07-07 ENCOUNTER — Ambulatory Visit: Payer: Medicare Other | Admitting: Nurse Practitioner

## 2011-07-07 ENCOUNTER — Encounter (HOSPITAL_BASED_OUTPATIENT_CLINIC_OR_DEPARTMENT_OTHER): Payer: Medicare Other | Admitting: Oncology

## 2011-07-07 ENCOUNTER — Other Ambulatory Visit: Payer: Self-pay | Admitting: Oncology

## 2011-07-07 DIAGNOSIS — Z5112 Encounter for antineoplastic immunotherapy: Secondary | ICD-10-CM

## 2011-07-07 DIAGNOSIS — D649 Anemia, unspecified: Secondary | ICD-10-CM

## 2011-07-07 DIAGNOSIS — C9000 Multiple myeloma not having achieved remission: Secondary | ICD-10-CM

## 2011-07-07 DIAGNOSIS — R972 Elevated prostate specific antigen [PSA]: Secondary | ICD-10-CM

## 2011-07-07 DIAGNOSIS — G609 Hereditary and idiopathic neuropathy, unspecified: Secondary | ICD-10-CM

## 2011-07-07 LAB — CBC WITH DIFFERENTIAL/PLATELET
BASO%: 0.2 % (ref 0.0–2.0)
Eosinophils Absolute: 0 10*3/uL (ref 0.0–0.5)
HCT: 32.2 % — ABNORMAL LOW (ref 38.4–49.9)
LYMPH%: 23.7 % (ref 14.0–49.0)
MCHC: 34 g/dL (ref 32.0–36.0)
MCV: 94.7 fL (ref 79.3–98.0)
MONO%: 10.1 % (ref 0.0–14.0)
NEUT%: 65.5 % (ref 39.0–75.0)
Platelets: 141 10*3/uL (ref 140–400)
RBC: 3.4 10*6/uL — ABNORMAL LOW (ref 4.20–5.82)

## 2011-07-10 ENCOUNTER — Encounter (HOSPITAL_BASED_OUTPATIENT_CLINIC_OR_DEPARTMENT_OTHER): Payer: Medicare Other | Admitting: Oncology

## 2011-07-10 DIAGNOSIS — C9000 Multiple myeloma not having achieved remission: Secondary | ICD-10-CM

## 2011-07-10 DIAGNOSIS — R21 Rash and other nonspecific skin eruption: Secondary | ICD-10-CM

## 2011-07-10 DIAGNOSIS — M549 Dorsalgia, unspecified: Secondary | ICD-10-CM

## 2011-07-10 DIAGNOSIS — Z5111 Encounter for antineoplastic chemotherapy: Secondary | ICD-10-CM

## 2011-07-11 LAB — COMPREHENSIVE METABOLIC PANEL
Alkaline Phosphatase: 39 U/L (ref 39–117)
CO2: 23 mEq/L (ref 19–32)
Creatinine, Ser: 1.63 mg/dL — ABNORMAL HIGH (ref 0.50–1.35)
Glucose, Bld: 103 mg/dL — ABNORMAL HIGH (ref 70–99)
Sodium: 132 mEq/L — ABNORMAL LOW (ref 135–145)
Total Bilirubin: 0.5 mg/dL (ref 0.3–1.2)
Total Protein: 6.3 g/dL (ref 6.0–8.3)

## 2011-07-11 LAB — KAPPA/LAMBDA LIGHT CHAINS
Kappa free light chain: 57.6 mg/dL — ABNORMAL HIGH (ref 0.33–1.94)
Lambda Free Lght Chn: 2.64 mg/dL — ABNORMAL HIGH (ref 0.57–2.63)

## 2011-07-12 ENCOUNTER — Encounter (HOSPITAL_BASED_OUTPATIENT_CLINIC_OR_DEPARTMENT_OTHER): Payer: Medicare Other | Admitting: Oncology

## 2011-07-12 ENCOUNTER — Other Ambulatory Visit: Payer: Self-pay | Admitting: Oncology

## 2011-07-12 DIAGNOSIS — G609 Hereditary and idiopathic neuropathy, unspecified: Secondary | ICD-10-CM

## 2011-07-12 DIAGNOSIS — R972 Elevated prostate specific antigen [PSA]: Secondary | ICD-10-CM

## 2011-07-12 DIAGNOSIS — Z5112 Encounter for antineoplastic immunotherapy: Secondary | ICD-10-CM

## 2011-07-12 DIAGNOSIS — C9000 Multiple myeloma not having achieved remission: Secondary | ICD-10-CM

## 2011-07-12 DIAGNOSIS — Z86718 Personal history of other venous thrombosis and embolism: Secondary | ICD-10-CM

## 2011-07-12 LAB — CBC WITH DIFFERENTIAL/PLATELET
Basophils Absolute: 0 10*3/uL (ref 0.0–0.1)
EOS%: 0.5 % (ref 0.0–7.0)
Eosinophils Absolute: 0 10*3/uL (ref 0.0–0.5)
HCT: 33 % — ABNORMAL LOW (ref 38.4–49.9)
HGB: 11.4 g/dL — ABNORMAL LOW (ref 13.0–17.1)
MCH: 32.5 pg (ref 27.2–33.4)
MCV: 94.4 fL (ref 79.3–98.0)
MONO%: 11.2 % (ref 0.0–14.0)
NEUT#: 4.7 10*3/uL (ref 1.5–6.5)
NEUT%: 61 % (ref 39.0–75.0)
Platelets: 145 10*3/uL (ref 140–400)
RDW: 14.9 % — ABNORMAL HIGH (ref 11.0–14.6)

## 2011-07-12 LAB — COMPREHENSIVE METABOLIC PANEL
AST: 30 U/L (ref 0–37)
Albumin: 3.5 g/dL (ref 3.5–5.2)
Alkaline Phosphatase: 55 U/L (ref 39–117)
BUN: 22 mg/dL (ref 6–23)
Calcium: 9.3 mg/dL (ref 8.4–10.5)
Creatinine, Ser: 1.22 mg/dL (ref 0.50–1.35)
Glucose, Bld: 70 mg/dL (ref 70–99)
Potassium: 4.3 mEq/L (ref 3.5–5.3)

## 2011-07-14 ENCOUNTER — Encounter: Payer: Self-pay | Admitting: Nurse Practitioner

## 2011-07-14 ENCOUNTER — Ambulatory Visit (INDEPENDENT_AMBULATORY_CARE_PROVIDER_SITE_OTHER): Payer: Medicare Other | Admitting: Nurse Practitioner

## 2011-07-14 VITALS — BP 138/64 | HR 60 | Ht 69.0 in | Wt 188.8 lb

## 2011-07-14 DIAGNOSIS — I255 Ischemic cardiomyopathy: Secondary | ICD-10-CM

## 2011-07-14 DIAGNOSIS — N289 Disorder of kidney and ureter, unspecified: Secondary | ICD-10-CM

## 2011-07-14 DIAGNOSIS — R0602 Shortness of breath: Secondary | ICD-10-CM

## 2011-07-14 DIAGNOSIS — C9000 Multiple myeloma not having achieved remission: Secondary | ICD-10-CM

## 2011-07-14 DIAGNOSIS — I519 Heart disease, unspecified: Secondary | ICD-10-CM

## 2011-07-14 DIAGNOSIS — I2589 Other forms of chronic ischemic heart disease: Secondary | ICD-10-CM

## 2011-07-14 DIAGNOSIS — IMO0001 Reserved for inherently not codable concepts without codable children: Secondary | ICD-10-CM

## 2011-07-14 DIAGNOSIS — I2510 Atherosclerotic heart disease of native coronary artery without angina pectoris: Secondary | ICD-10-CM

## 2011-07-14 NOTE — Assessment & Plan Note (Signed)
Currently stable.

## 2011-07-14 NOTE — Assessment & Plan Note (Signed)
Has had remote MI in 1986 with CABG in 2001. Last nuclear shows EF of 39% with inferior scar and no ischemia. He looks compensated. He is mostly limited by this back pain. No changes in his current therapy from our standpoint. He understands the symptoms to be on the look out for. I will have him see Dr. Swaziland in 4 months. Patient is agreeable to this plan and will call if any problems develop in the interim.

## 2011-07-14 NOTE — Progress Notes (Signed)
Timothy [name] Date of Birth: [birth year]   History of Present Illness: Dr. [name] is seen today for a follow up visit. It is an 8 week check. He is a former patient of Dr. Ronnald Nian. He is seen for Dr. Swaziland. He is doing well. No real complaint from his cardiac standpoint. No chest pain. No real shortness of breath. He will have some transient edema but nothing that is bothersome. No cough. No PND, or orthopnea. His nuclear study showed an EF of 39% with inferior scar but no ischemia. He is mostly limited by his back and left buttock pain. He can hardly walk. Not really exercising. He has to sit a lot. He is tolerating his medicines.   Current Outpatient Prescriptions on File Prior to Visit  Medication Sig Dispense Refill  . acetaminophen (TYLENOL) 500 MG tablet Take 500 mg by mouth as needed.        Marland Kitchen allopurinol (ZYLOPRIM) 300 MG tablet TAKE 1 TABLET DAILY  90 tablet  3  . aspirin 81 MG tablet Take 81 mg by mouth daily.        Marland Kitchen atorvastatin (LIPITOR) 20 MG tablet Take 20 mg by mouth daily.        . B Complex-Biotin-FA (B COMPLETE PO) Take 1 tablet by mouth daily.        . carvedilol (COREG) 3.125 MG tablet Take 1 tablet by mouth Twice daily.      . Coenzyme Q10 (CO Q-10 PO) Take 1 tablet by mouth daily.        Marland Kitchen HYDROmorphone (DILAUDID) 4 MG tablet Take 1 tablet by mouth as needed.       Marland Kitchen lisinopril (PRINIVIL,ZESTRIL) 20 MG tablet Take 1 tablet by mouth Daily.        . multivitamin (THERAGRAN) per tablet Take 1 tablet by mouth daily.        . Omega-3 Fatty Acids (FISH OIL PO) Take 1 capsule by mouth 2 (two) times daily.        Marland Kitchen triamterene-hydrochlorothiazide (DYAZIDE) 37.5-25 MG per capsule Take 1 tablet by mouth Daily. Take 1 and 1/2 daily        No Known Allergies  Past Medical History  Diagnosis Date  . ASCVD (arteriosclerotic cardiovascular disease)   . Anemia     chronic mild anemia  . Gout   . Hypertension   . Coronary artery disease     CABG 1991  . Lumbar disc  disease     post laminectomy  . Chronic back pain   . HX: long term anticoagulant use   . History of echocardiogram 11/02/2010    EF range of 30 to 35% / There is hypokinsesis of the basal-mild inferolateral myocardium  . CHF (congestive heart failure)   . Heart murmur   . Ischemic cardiomyopathy   . Renal insufficiency   . Peripheral neuropathy   . Inferior myocardial infarction 1986  . Hyperlipidemia   . GERD (gastroesophageal reflux disease)   . SOB (shortness of breath)   . Chest pain   . Fatigue   . Myeloma     Multiple myeloma, with recurrence of increasing problems    Past Surgical History  Procedure Date  . Laminectomy   . Inguinal hernia repair   . Cardiac catheterization 06/20/2000    Severe coronary disease (totally occluded right artery, 90% left circumflex, 50-60% intermediate, and 90-95% ostial left anterior descending)   . Tonsillectomy   . Coronary artery bypass graft 06/22/2000  x4 / Left internal mammary artery to the LAD.  / Saphenous vein graft to the right posterolateral.  /  Saphenous vein graft to  the ramus intermedius. / Saphenous vein graft to the circumflex marginal       History  Smoking status  . Former Smoker -- 1.0 packs/day for 20 years  . Types: Cigarettes  . Quit date: 11/20/1984  Smokeless tobacco  . Never Used    History  Alcohol Use No    History reviewed. No pertinent family history.  Review of Systems: The review of systems is positive for back and left buttock pain. No cardiac standpoints. He does use salt to keep his sodium levels up. He has had a flare up of his myeloma and had a recent 2nd opinion at [facility]. He is on Velcade.  All other systems were reviewed and are negative.  Physical Exam: BP 138/64  Pulse 60  Ht 5\' 9"  (1.753 m)  Wt 188 lb 12.8 oz (85.639 kg)  BMI 27.88 kg/m2 Patient is very pleasant and in no acute distress. Skin is warm and dry. Color is normal.  HEENT is unremarkable.  Normocephalic/atraumatic. PERRL. Sclera are nonicteric. Neck is supple. No masses. No JVD. Lungs are clear. Cardiac exam shows a regular rate and rhythm. Abdomen is soft. Extremities are without edema. Gait and ROM are intact. No gross neurologic deficits noted.   LABORATORY DATA: [facility]. Hemoglobin is 11.4 with HCT of 33. Renal function has improved. BUN is 22 with creatinine of 1.2.    Assessment / Plan:

## 2011-07-14 NOTE — Assessment & Plan Note (Signed)
Remains on Velcade and reports a good response.

## 2011-07-14 NOTE — Patient Instructions (Signed)
Stay on your current medicines. I want you to see Dr. Swaziland in about 4 months Call for any problems

## 2011-07-14 NOTE — Assessment & Plan Note (Signed)
He has a very minimal degree of DOE. I suspect this is more due to his inactivity.

## 2011-07-26 ENCOUNTER — Other Ambulatory Visit: Payer: Self-pay | Admitting: Oncology

## 2011-07-26 ENCOUNTER — Encounter (HOSPITAL_BASED_OUTPATIENT_CLINIC_OR_DEPARTMENT_OTHER): Payer: Medicare Other | Admitting: Oncology

## 2011-07-26 DIAGNOSIS — Z5112 Encounter for antineoplastic immunotherapy: Secondary | ICD-10-CM

## 2011-07-26 DIAGNOSIS — M549 Dorsalgia, unspecified: Secondary | ICD-10-CM

## 2011-07-26 DIAGNOSIS — R21 Rash and other nonspecific skin eruption: Secondary | ICD-10-CM

## 2011-07-26 DIAGNOSIS — D539 Nutritional anemia, unspecified: Secondary | ICD-10-CM

## 2011-07-26 DIAGNOSIS — C9000 Multiple myeloma not having achieved remission: Secondary | ICD-10-CM

## 2011-07-26 LAB — CBC WITH DIFFERENTIAL/PLATELET
Basophils Absolute: 0 10*3/uL (ref 0.0–0.1)
Eosinophils Absolute: 0 10*3/uL (ref 0.0–0.5)
HCT: 30.8 % — ABNORMAL LOW (ref 38.4–49.9)
HGB: 10.6 g/dL — ABNORMAL LOW (ref 13.0–17.1)
LYMPH%: 26.4 % (ref 14.0–49.0)
MONO#: 0.7 10*3/uL (ref 0.1–0.9)
NEUT#: 4.1 10*3/uL (ref 1.5–6.5)
NEUT%: 61.8 % (ref 39.0–75.0)
Platelets: 167 10*3/uL (ref 140–400)
WBC: 6.6 10*3/uL (ref 4.0–10.3)
lymph#: 1.8 10*3/uL (ref 0.9–3.3)

## 2011-07-27 LAB — COMPREHENSIVE METABOLIC PANEL
ALT: 12 U/L (ref 0–53)
CO2: 23 mEq/L (ref 19–32)
Calcium: 8.8 mg/dL (ref 8.4–10.5)
Chloride: 98 mEq/L (ref 96–112)
Creatinine, Ser: 1.32 mg/dL (ref 0.50–1.35)
Sodium: 129 mEq/L — ABNORMAL LOW (ref 135–145)
Total Protein: 6.2 g/dL (ref 6.0–8.3)

## 2011-07-27 LAB — KAPPA/LAMBDA LIGHT CHAINS: Kappa free light chain: 59.1 mg/dL — ABNORMAL HIGH (ref 0.33–1.94)

## 2011-07-28 LAB — UIFE/LIGHT CHAINS/TP QN, 24-HR UR
Albumin, U: DETECTED
Alpha 1, Urine: DETECTED — AB
Free Kappa/Lambda Ratio: 150 ratio — ABNORMAL HIGH (ref 2.04–10.37)
Free Lambda Excretion/Day: 28.16 mg/d
Free Lambda Lt Chains,Ur: 0.88 mg/dL — ABNORMAL HIGH (ref 0.02–0.67)
Gamma Globulin, Urine: DETECTED — AB
Time: 24 hours
Total Protein, Urine: 133.7 mg/dL
Volume, Urine: 3200 mL

## 2011-08-01 ENCOUNTER — Other Ambulatory Visit: Payer: Self-pay | Admitting: Oncology

## 2011-08-01 ENCOUNTER — Encounter (HOSPITAL_BASED_OUTPATIENT_CLINIC_OR_DEPARTMENT_OTHER): Payer: Medicare Other | Admitting: Oncology

## 2011-08-01 DIAGNOSIS — R21 Rash and other nonspecific skin eruption: Secondary | ICD-10-CM

## 2011-08-01 DIAGNOSIS — Z5111 Encounter for antineoplastic chemotherapy: Secondary | ICD-10-CM

## 2011-08-01 DIAGNOSIS — M549 Dorsalgia, unspecified: Secondary | ICD-10-CM

## 2011-08-01 DIAGNOSIS — C9000 Multiple myeloma not having achieved remission: Secondary | ICD-10-CM

## 2011-08-01 DIAGNOSIS — Z5112 Encounter for antineoplastic immunotherapy: Secondary | ICD-10-CM

## 2011-08-01 LAB — CBC WITH DIFFERENTIAL/PLATELET
BASO%: 0.1 % (ref 0.0–2.0)
Eosinophils Absolute: 0 10*3/uL (ref 0.0–0.5)
LYMPH%: 24.5 % (ref 14.0–49.0)
MCHC: 34.5 g/dL (ref 32.0–36.0)
MCV: 93.5 fL (ref 79.3–98.0)
MONO#: 0.7 10*3/uL (ref 0.1–0.9)
MONO%: 9.6 % (ref 0.0–14.0)
NEUT#: 5.1 10*3/uL (ref 1.5–6.5)
Platelets: 151 10*3/uL (ref 140–400)
RBC: 3.12 10*6/uL — ABNORMAL LOW (ref 4.20–5.82)
RDW: 15.1 % — ABNORMAL HIGH (ref 11.0–14.6)
WBC: 7.7 10*3/uL (ref 4.0–10.3)

## 2011-08-01 LAB — BASIC METABOLIC PANEL
Calcium: 9.2 mg/dL (ref 8.4–10.5)
Potassium: 4.6 mEq/L (ref 3.5–5.3)
Sodium: 135 mEq/L (ref 135–145)

## 2011-08-08 ENCOUNTER — Other Ambulatory Visit: Payer: Self-pay | Admitting: *Deleted

## 2011-08-08 MED ORDER — ATORVASTATIN CALCIUM 40 MG PO TABS: 40.0000 mg | ORAL_TABLET | Freq: Every day | ORAL | Status: DC

## 2011-08-08 MED ORDER — CARVEDILOL 3.125 MG PO TABS: 3.1250 mg | ORAL_TABLET | Freq: Two times a day (BID) | ORAL | Status: DC

## 2011-08-08 NOTE — Telephone Encounter (Signed)
Refilled Meds from fax  

## 2011-08-09 ENCOUNTER — Other Ambulatory Visit: Payer: Self-pay | Admitting: Physician Assistant

## 2011-08-09 ENCOUNTER — Encounter (HOSPITAL_BASED_OUTPATIENT_CLINIC_OR_DEPARTMENT_OTHER): Payer: Medicare Other | Admitting: Oncology

## 2011-08-09 DIAGNOSIS — C9000 Multiple myeloma not having achieved remission: Secondary | ICD-10-CM

## 2011-08-09 DIAGNOSIS — Z5112 Encounter for antineoplastic immunotherapy: Secondary | ICD-10-CM

## 2011-08-09 LAB — BASIC METABOLIC PANEL
CO2: 25 mEq/L (ref 19–32)
Chloride: 97 mEq/L (ref 96–112)
Glucose, Bld: 99 mg/dL (ref 70–99)
Potassium: 4.5 mEq/L (ref 3.5–5.3)
Sodium: 130 mEq/L — ABNORMAL LOW (ref 135–145)

## 2011-08-14 LAB — DIFFERENTIAL
Eosinophils Relative: 0
Lymphocytes Relative: 18
Lymphs Abs: 2.3
Monocytes Absolute: 1.3 — ABNORMAL HIGH

## 2011-08-14 LAB — CBC
MCHC: 35.4
MCV: 90.3
Platelets: 210

## 2011-08-14 LAB — IGG, IGA, IGM: IgG (Immunoglobin G), Serum: 697

## 2011-08-14 LAB — COMPREHENSIVE METABOLIC PANEL
AST: 22
Albumin: 3.4 — ABNORMAL LOW
Calcium: 9
Creatinine, Ser: 1.16
GFR calc Af Amer: >60
GFR calc non Af Amer: >60

## 2011-08-14 LAB — KAPPA/LAMBDA LIGHT CHAINS
Kappa free light chain: 3.71 — ABNORMAL HIGH (ref 0.33–1.94)
Lambda free light chains: 1.48 (ref 0.57–2.63)

## 2011-08-21 ENCOUNTER — Other Ambulatory Visit: Payer: Self-pay | Admitting: Oncology

## 2011-08-23 ENCOUNTER — Encounter (HOSPITAL_BASED_OUTPATIENT_CLINIC_OR_DEPARTMENT_OTHER): Payer: Medicare Other | Admitting: Oncology

## 2011-08-23 ENCOUNTER — Other Ambulatory Visit: Payer: Self-pay | Admitting: Oncology

## 2011-08-23 DIAGNOSIS — C9000 Multiple myeloma not having achieved remission: Secondary | ICD-10-CM

## 2011-08-23 DIAGNOSIS — Z5111 Encounter for antineoplastic chemotherapy: Secondary | ICD-10-CM

## 2011-08-23 DIAGNOSIS — R21 Rash and other nonspecific skin eruption: Secondary | ICD-10-CM

## 2011-08-23 DIAGNOSIS — M549 Dorsalgia, unspecified: Secondary | ICD-10-CM

## 2011-08-23 LAB — CBC WITH DIFFERENTIAL/PLATELET
BASO%: 0.3 % (ref 0.0–2.0)
EOS%: 0.5 % (ref 0.0–7.0)
Eosinophils Absolute: 0 10*3/uL (ref 0.0–0.5)
MCHC: 34.4 g/dL (ref 32.0–36.0)
MCV: 95 fL (ref 79.3–98.0)
MONO%: 12.6 % (ref 0.0–14.0)
NEUT#: 3.9 10*3/uL (ref 1.5–6.5)
RBC: 3.04 10*6/uL — ABNORMAL LOW (ref 4.20–5.82)
RDW: 15.4 % — ABNORMAL HIGH (ref 11.0–14.6)

## 2011-08-24 LAB — COMPREHENSIVE METABOLIC PANEL
ALT: 16 U/L (ref 0–53)
AST: 29 U/L (ref 0–37)
Albumin: 4 g/dL (ref 3.5–5.2)
Alkaline Phosphatase: 38 U/L — ABNORMAL LOW (ref 39–117)
Glucose, Bld: 88 mg/dL (ref 70–99)
Potassium: 4.7 mEq/L (ref 3.5–5.3)
Sodium: 134 mEq/L — ABNORMAL LOW (ref 135–145)
Total Protein: 6.4 g/dL (ref 6.0–8.3)

## 2011-08-24 LAB — IGG, IGA, IGM
IgA: 273 mg/dL (ref 68–379)
IgM, Serum: 56 mg/dL (ref 41–251)

## 2011-08-25 LAB — UIFE/LIGHT CHAINS/TP QN, 24-HR UR

## 2011-08-30 ENCOUNTER — Encounter (HOSPITAL_BASED_OUTPATIENT_CLINIC_OR_DEPARTMENT_OTHER): Payer: Medicare Other | Admitting: Oncology

## 2011-08-30 DIAGNOSIS — C9000 Multiple myeloma not having achieved remission: Secondary | ICD-10-CM

## 2011-08-30 DIAGNOSIS — Z86718 Personal history of other venous thrombosis and embolism: Secondary | ICD-10-CM

## 2011-08-30 DIAGNOSIS — Z5112 Encounter for antineoplastic immunotherapy: Secondary | ICD-10-CM

## 2011-09-04 LAB — COMPREHENSIVE METABOLIC PANEL
Albumin: 2.5 — ABNORMAL LOW
Alkaline Phosphatase: 62
BUN: 11
Calcium: 7.8 — ABNORMAL LOW
Glucose, Bld: 91
Potassium: 3.8
Sodium: 133 — ABNORMAL LOW
Total Protein: 4.6 — ABNORMAL LOW

## 2011-09-04 LAB — CBC
HCT: 30.3 — ABNORMAL LOW
HCT: 30.9 — ABNORMAL LOW
HCT: 32.9 — ABNORMAL LOW
Hemoglobin: 10 — ABNORMAL LOW
Hemoglobin: 10.4 — ABNORMAL LOW
Hemoglobin: 10.5 — ABNORMAL LOW
Hemoglobin: 10.7 — ABNORMAL LOW
MCHC: 34.3
Platelets: 66 — ABNORMAL LOW
Platelets: 70 — ABNORMAL LOW
RBC: 3.07 — ABNORMAL LOW
RBC: 3.16 — ABNORMAL LOW
RBC: 3.23 — ABNORMAL LOW
RBC: 3.24 — ABNORMAL LOW
RDW: 15.4 — ABNORMAL HIGH
RDW: 15.8 — ABNORMAL HIGH
RDW: 16.2 — ABNORMAL HIGH
WBC: 8.1
WBC: 8.8
WBC: 8.9
WBC: 9

## 2011-09-04 LAB — BASIC METABOLIC PANEL
Chloride: 108
GFR calc non Af Amer: >60
Potassium: 4.1
Sodium: 138

## 2011-09-04 LAB — PROTIME-INR
INR: 1.1
INR: 1.2
INR: 1.6 — ABNORMAL HIGH
INR: 1.9 — ABNORMAL HIGH
INR: 2 — ABNORMAL HIGH
Prothrombin Time: 19.5 — ABNORMAL HIGH
Prothrombin Time: 22.7 — ABNORMAL HIGH
Prothrombin Time: 23.6 — ABNORMAL HIGH

## 2011-09-04 LAB — DIFFERENTIAL
Basophils Relative: 0
Lymphs Abs: 1.2
Monocytes Absolute: 0.4
Monocytes Relative: 4
Neutro Abs: 9.5 — ABNORMAL HIGH
Neutrophils Relative %: 85 — ABNORMAL HIGH

## 2011-09-04 LAB — HEPARIN LEVEL (UNFRACTIONATED): Heparin Unfractionated: 0.61

## 2011-09-04 LAB — D-DIMER, QUANTITATIVE: D-Dimer, Quant: 2.73 — ABNORMAL HIGH

## 2011-09-04 LAB — APTT
aPTT: 25
aPTT: 60 — ABNORMAL HIGH

## 2011-09-06 ENCOUNTER — Other Ambulatory Visit: Payer: Self-pay | Admitting: Oncology

## 2011-09-06 ENCOUNTER — Encounter (HOSPITAL_BASED_OUTPATIENT_CLINIC_OR_DEPARTMENT_OTHER): Payer: Medicare Other | Admitting: Oncology

## 2011-09-06 DIAGNOSIS — C9000 Multiple myeloma not having achieved remission: Secondary | ICD-10-CM

## 2011-09-06 DIAGNOSIS — Z5112 Encounter for antineoplastic immunotherapy: Secondary | ICD-10-CM

## 2011-09-06 LAB — CBC WITH DIFFERENTIAL/PLATELET
BASO%: 0.3 % (ref 0.0–2.0)
Basophils Absolute: 0 10*3/uL (ref 0.0–0.1)
EOS%: 0.4 % (ref 0.0–7.0)
HGB: 10.4 g/dL — ABNORMAL LOW (ref 13.0–17.1)
MCH: 32.8 pg (ref 27.2–33.4)
MCHC: 34.5 g/dL (ref 32.0–36.0)
RDW: 15 % — ABNORMAL HIGH (ref 11.0–14.6)
lymph#: 1.6 10*3/uL (ref 0.9–3.3)

## 2011-09-06 LAB — BASIC METABOLIC PANEL
Chloride: 91 mEq/L — ABNORMAL LOW (ref 96–112)
Potassium: 4.2 mEq/L (ref 3.5–5.3)
Sodium: 123 mEq/L — ABNORMAL LOW (ref 135–145)

## 2011-09-12 ENCOUNTER — Other Ambulatory Visit: Payer: Self-pay | Admitting: Oncology

## 2011-09-12 ENCOUNTER — Encounter: Payer: Self-pay | Admitting: *Deleted

## 2011-09-12 DIAGNOSIS — C9000 Multiple myeloma not having achieved remission: Secondary | ICD-10-CM | POA: Insufficient documentation

## 2011-09-13 ENCOUNTER — Other Ambulatory Visit: Payer: Self-pay | Admitting: Physician Assistant

## 2011-09-18 ENCOUNTER — Other Ambulatory Visit: Payer: Self-pay | Admitting: Oncology

## 2011-09-20 ENCOUNTER — Telehealth: Payer: Self-pay | Admitting: *Deleted

## 2011-09-20 ENCOUNTER — Other Ambulatory Visit: Payer: Self-pay | Admitting: Oncology

## 2011-09-20 ENCOUNTER — Encounter (HOSPITAL_BASED_OUTPATIENT_CLINIC_OR_DEPARTMENT_OTHER): Payer: Medicare Other | Admitting: Oncology

## 2011-09-20 DIAGNOSIS — Z5112 Encounter for antineoplastic immunotherapy: Secondary | ICD-10-CM

## 2011-09-20 DIAGNOSIS — M549 Dorsalgia, unspecified: Secondary | ICD-10-CM

## 2011-09-20 DIAGNOSIS — C9000 Multiple myeloma not having achieved remission: Secondary | ICD-10-CM

## 2011-09-20 DIAGNOSIS — R21 Rash and other nonspecific skin eruption: Secondary | ICD-10-CM

## 2011-09-20 DIAGNOSIS — Z5111 Encounter for antineoplastic chemotherapy: Secondary | ICD-10-CM

## 2011-09-20 LAB — UIFE/LIGHT CHAINS/TP QN, 24-HR UR
Albumin, U: DETECTED
Alpha 1, Urine: DETECTED — AB
Beta, Urine: DETECTED — AB
Gamma Globulin, Urine: DETECTED — AB

## 2011-09-20 LAB — CBC WITH DIFFERENTIAL/PLATELET
Basophils Absolute: 0 10*3/uL (ref 0.0–0.1)
Eosinophils Absolute: 0 10*3/uL (ref 0.0–0.5)
HCT: 29 % — ABNORMAL LOW (ref 38.4–49.9)
HGB: 9.8 g/dL — ABNORMAL LOW (ref 13.0–17.1)
LYMPH%: 17.1 % (ref 14.0–49.0)
MCV: 96.4 fL (ref 79.3–98.0)
MONO%: 11.5 % (ref 0.0–14.0)
NEUT#: 4.1 10*3/uL (ref 1.5–6.5)
Platelets: 162 10*3/uL (ref 140–400)

## 2011-09-21 LAB — COMPREHENSIVE METABOLIC PANEL
ALT: 19 U/L (ref 0–53)
Albumin: 3.9 g/dL (ref 3.5–5.2)
Alkaline Phosphatase: 49 U/L (ref 39–117)
CO2: 20 mEq/L (ref 19–32)
Glucose, Bld: 86 mg/dL (ref 70–99)
Potassium: 4.3 mEq/L (ref 3.5–5.3)
Sodium: 129 mEq/L — ABNORMAL LOW (ref 135–145)
Total Protein: 6.1 g/dL (ref 6.0–8.3)

## 2011-09-21 LAB — KAPPA/LAMBDA LIGHT CHAINS
Kappa free light chain: 29.5 mg/dL — ABNORMAL HIGH (ref 0.33–1.94)
Kappa:Lambda Ratio: 15.05 — ABNORMAL HIGH (ref 0.26–1.65)
Lambda Free Lght Chn: 1.96 mg/dL (ref 0.57–2.63)

## 2011-09-21 LAB — IGG, IGA, IGM: IgM, Serum: 49 mg/dL (ref 41–251)

## 2011-09-27 ENCOUNTER — Other Ambulatory Visit: Payer: Self-pay | Admitting: Oncology

## 2011-09-27 ENCOUNTER — Other Ambulatory Visit (HOSPITAL_BASED_OUTPATIENT_CLINIC_OR_DEPARTMENT_OTHER): Payer: Medicare Other

## 2011-09-27 ENCOUNTER — Ambulatory Visit (HOSPITAL_BASED_OUTPATIENT_CLINIC_OR_DEPARTMENT_OTHER): Payer: Medicare Other

## 2011-09-27 DIAGNOSIS — C9000 Multiple myeloma not having achieved remission: Secondary | ICD-10-CM

## 2011-09-27 DIAGNOSIS — Z5112 Encounter for antineoplastic immunotherapy: Secondary | ICD-10-CM

## 2011-09-27 LAB — CBC WITH DIFFERENTIAL/PLATELET
BASO%: 0 % (ref 0.0–2.0)
Basophils Absolute: 0 10*3/uL (ref 0.0–0.1)
EOS%: 0 % (ref 0.0–7.0)
HCT: 26.4 % — ABNORMAL LOW (ref 38.4–49.9)
HGB: 9.1 g/dL — ABNORMAL LOW (ref 13.0–17.1)
LYMPH%: 26 % (ref 14.0–49.0)
MCH: 31.6 pg (ref 27.2–33.4)
MCHC: 34.5 g/dL (ref 32.0–36.0)
MCV: 91.7 fL (ref 79.3–98.0)
MONO%: 12.5 % (ref 0.0–14.0)
NEUT%: 61.5 % (ref 39.0–75.0)

## 2011-09-27 LAB — PROTEIN / CREATININE RATIO, URINE
Creatinine, Urine: 41.2 mg/dL
Total Protein, Urine: 7 mg/dL

## 2011-09-27 MED ORDER — ONDANSETRON HCL 8 MG PO TABS
8.0000 mg | ORAL_TABLET | Freq: Once | ORAL | Status: AC
  Administered 2011-09-27: 8 mg via ORAL

## 2011-09-27 MED ORDER — BORTEZOMIB CHEMO SQ INJECTION 3.5 MG (2.5MG/ML)
3.1000 mg | Freq: Once | INTRAMUSCULAR | Status: AC
  Administered 2011-09-27: 3 mg via SUBCUTANEOUS
  Filled 2011-09-27: qty 3

## 2011-09-27 NOTE — Progress Notes (Signed)
Patient verbalizes next appointment. Urine to lab.

## 2011-09-27 NOTE — Patient Instructions (Signed)
    November 2012  Sunday Monday Tuesday Wednesday Thursday Friday Saturday              1   2   3            4   5   6   7   LAB MO     8:30 AM  (15 min.)  Timothy [name]  [facility] MED[facility] ONCOL[facility]   INFUSION 1 HR       8:45 AM  (60 min.)  [facility] I[facility] Dns  [facility] MED[facility] ONCOL[facility] 8   9 Happy Birthday!   10       Cycle 9, Day 1     11   12   13   14   LAB MO     9:00 AM  (15 min.)  Timothy [name]  [facility] ONCOLOG[facility]   INFUSION 1 HR       9:30 AM  (60 min.)  [facility] F19   [facility] 15   16   17       Cycle 10, Day 1     18   19   20   21   22   23   24            25    26   LAB MO     9:30 AM  (15 min.)  Timothy [name]  [facility] MEDICAL ONCOLOGY 27   28   EST PT 30   8:45 AM  (30 min.)  [name], MD  [facility] MEDICAL ONCOLOGY 29   30                 Treatment Details  09/27/2011 - Cycle 9, Day 1     Chemotherapy: bortezomib SQ (VELCADE)  10/04/2011 - Cycle 10, Day 1     Chemotherapy: bortezomib SQ (VELCADE)

## 2011-10-04 ENCOUNTER — Other Ambulatory Visit: Payer: Self-pay | Admitting: Oncology

## 2011-10-04 ENCOUNTER — Ambulatory Visit (HOSPITAL_BASED_OUTPATIENT_CLINIC_OR_DEPARTMENT_OTHER): Payer: Medicare Other

## 2011-10-04 ENCOUNTER — Other Ambulatory Visit (HOSPITAL_BASED_OUTPATIENT_CLINIC_OR_DEPARTMENT_OTHER): Payer: Medicare Other

## 2011-10-04 DIAGNOSIS — Z5112 Encounter for antineoplastic immunotherapy: Secondary | ICD-10-CM

## 2011-10-04 DIAGNOSIS — C9000 Multiple myeloma not having achieved remission: Secondary | ICD-10-CM

## 2011-10-04 LAB — CBC WITH DIFFERENTIAL/PLATELET
Eosinophils Absolute: 0 10*3/uL (ref 0.0–0.5)
MONO#: 0.7 10*3/uL (ref 0.1–0.9)
NEUT#: 3.2 10*3/uL (ref 1.5–6.5)
Platelets: 128 10*3/uL — ABNORMAL LOW (ref 140–400)
RBC: 2.57 10*6/uL — ABNORMAL LOW (ref 4.20–5.82)
RDW: 17.4 % — ABNORMAL HIGH (ref 11.0–14.6)
WBC: 4.7 10*3/uL (ref 4.0–10.3)

## 2011-10-04 LAB — PROTEIN / CREATININE RATIO, URINE
Creatinine, Urine: 72.3 mg/dL
Protein Creatinine Ratio: 0.11 (ref <0.15)
Total Protein, Urine: 8 mg/dL

## 2011-10-04 MED ORDER — ONDANSETRON HCL 8 MG PO TABS
8.0000 mg | ORAL_TABLET | Freq: Once | ORAL | Status: AC
  Administered 2011-10-04: 8 mg via ORAL

## 2011-10-04 MED ORDER — BORTEZOMIB CHEMO SQ INJECTION 3.5 MG (2.5MG/ML)
3.1000 mg | Freq: Once | INTRAMUSCULAR | Status: AC
  Administered 2011-10-04: 3 mg via SUBCUTANEOUS
  Filled 2011-10-04: qty 3

## 2011-10-16 ENCOUNTER — Other Ambulatory Visit: Payer: Self-pay | Admitting: Oncology

## 2011-10-16 ENCOUNTER — Other Ambulatory Visit (HOSPITAL_BASED_OUTPATIENT_CLINIC_OR_DEPARTMENT_OTHER): Payer: Medicare Other | Admitting: Lab

## 2011-10-16 DIAGNOSIS — Z5111 Encounter for antineoplastic chemotherapy: Secondary | ICD-10-CM

## 2011-10-16 DIAGNOSIS — M549 Dorsalgia, unspecified: Secondary | ICD-10-CM

## 2011-10-16 DIAGNOSIS — C9000 Multiple myeloma not having achieved remission: Secondary | ICD-10-CM

## 2011-10-16 DIAGNOSIS — R21 Rash and other nonspecific skin eruption: Secondary | ICD-10-CM

## 2011-10-16 LAB — CBC WITH DIFFERENTIAL/PLATELET
Eosinophils Absolute: 0 10*3/uL (ref 0.0–0.5)
MONO#: 0.8 10*3/uL (ref 0.1–0.9)
NEUT#: 2.9 10*3/uL (ref 1.5–6.5)
RBC: 2.53 10*6/uL — ABNORMAL LOW (ref 4.20–5.82)
RDW: 17.2 % — ABNORMAL HIGH (ref 11.0–14.6)
WBC: 4.4 10*3/uL (ref 4.0–10.3)

## 2011-10-16 LAB — COMPREHENSIVE METABOLIC PANEL
AST: 31 U/L (ref 0–37)
Albumin: 3.8 g/dL (ref 3.5–5.2)
BUN: 44 mg/dL — ABNORMAL HIGH (ref 6–23)
CO2: 24 mEq/L (ref 19–32)
Calcium: 8.9 mg/dL (ref 8.4–10.5)
Chloride: 100 mEq/L (ref 96–112)
Potassium: 5.1 mEq/L (ref 3.5–5.3)

## 2011-10-16 LAB — PROTEIN / CREATININE RATIO, URINE: Protein Creatinine Ratio: 0.08 (ref <0.15)

## 2011-10-18 ENCOUNTER — Telehealth: Payer: Self-pay | Admitting: Oncology

## 2011-10-18 ENCOUNTER — Ambulatory Visit (HOSPITAL_BASED_OUTPATIENT_CLINIC_OR_DEPARTMENT_OTHER): Payer: Medicare Other | Admitting: Oncology

## 2011-10-18 VITALS — BP 116/63 | HR 63 | Temp 98.1°F | Ht 70.3 in | Wt 188.4 lb

## 2011-10-18 DIAGNOSIS — C9000 Multiple myeloma not having achieved remission: Secondary | ICD-10-CM

## 2011-10-18 DIAGNOSIS — Z86711 Personal history of pulmonary embolism: Secondary | ICD-10-CM

## 2011-10-18 DIAGNOSIS — I255 Ischemic cardiomyopathy: Secondary | ICD-10-CM

## 2011-10-18 NOTE — Progress Notes (Signed)
ID: [name]   Interval History:  Timothy [name] returns today for followup of his multiple myeloma. Timothy [name] his wife is with him. They went to [place] and spent Thanksgiving with family. He also did a lot of gardening, shooting, and golfing.   He is now taking his Cytoxan and dexamethasone on Saturday and Sunday, so he can golf during the week. He had an epidural shot a week ago, and that has helped the left leg pain some. Overall his functional status remains stable.  ROS:  The peripheral neuropathy is "terrible", but not worse than previously. Not constipated, doesn't have any unusual headaches or visual changes, difficulty swallowing or pain on swallowing. He denies chest pain or pressure. He has had mild bilateral ankle swelling, and has increased his hydrochlorothiazide dose to 37.5 mg. He is aware of the need to need fruit and so on to keep his potassium up. Otherwise a detailed review of systems today was stable.  Medications: I have reviewed the patient's current medications.  Current Outpatient Prescriptions  Medication Sig Dispense Refill  . acetaminophen (TYLENOL) 500 MG tablet Take 500 mg by mouth as needed.        Marland Kitchen allopurinol (ZYLOPRIM) 300 MG tablet TAKE 1 TABLET DAILY  90 tablet  3  . aspirin 81 MG tablet Take 81 mg by mouth daily.        Marland Kitchen atorvastatin (LIPITOR) 40 MG tablet Take 1 tablet (40 mg total) by mouth daily.  90 tablet  3  . B Complex-Biotin-FA (B COMPLETE PO) Take 1 tablet by mouth daily.        . bortezomib SQ (VELCADE) 3.5 MG SOLR Inject 1.3 mg/m2 into the skin once. As directed.       . carvedilol (COREG) 3.125 MG tablet Take 1 tablet (3.125 mg total) by mouth 2 (two) times daily with a meal.  180 tablet  3  . Coenzyme Q10 (CO Q-10 PO) Take 1 tablet by mouth daily.        Marland Kitchen dexamethasone (DECADRON) 4 MG tablet Take 4 mg by mouth 2 (two) times daily with a meal. Take after chemo       . GABAPENTIN PO Take 900 mg by mouth daily. titrating up dose as directed      .  HYDROmorphone (DILAUDID) 4 MG tablet Take 1 tablet by mouth as needed.       Marland Kitchen lisinopril (PRINIVIL,ZESTRIL) 20 MG tablet Take 1 tablet by mouth Daily.        . multivitamin (THERAGRAN) per tablet Take 1 tablet by mouth daily.        . Omega-3 Fatty Acids (FISH OIL PO) Take 1 capsule by mouth 2 (two) times daily.        Marland Kitchen triamterene-hydrochlorothiazide (DYAZIDE) 37.5-25 MG per capsule Take 1 tablet by mouth Daily. Take 1 and 1/2 daily      . valACYclovir (VALTREX) 500 MG tablet Take 500 mg by mouth daily.           Objective: Vital signs in last 24 hours: BMI 26.9, 116/63,  64,  18   Physical Exam:    Sclerae unicteric  Oropharynx clear  No peripheral adenopathy  Lungs clear -- no rales or rhonchi  Heart regular rate and rhythm  Abdomen benign  MSK no focal spinal tenderness, minimal peripheral edema  Neuro nonfocal   Lab Results: kappa light chains have decreased from c. 60 early OCT to approx 30 early NOV; the prot/creat ration is trending down  as well; IgG/A/M remain WNR  CMP   Lab 10/16/11 0925  NA 130*  K 5.1  CL 100  CO2 24  BUN 44*  CREATININE 1.41*  CALCIUM 8.9  PROT 5.6*  BILITOT 0.9  ALKPHOS 38*  ALT 21  AST 31  GLUCOSE 98     CBC Lab Results  Component Value Date   WBC 4.4 10/16/2011   HGB 8.7* 10/16/2011   HCT 25.2* 10/16/2011   MCV 99.5* 10/16/2011   PLT 148 10/16/2011       Assessment:  77 year-old with kappa light chain multiple myeloma  (1) presenting April 2007 with anemia and renal failure; renal Bx showing free kappa light chain deposition; bone marrow biopsy showing 37% plasma cells, with normal cytogenetics and FISH;bone survey showing skull lytic lesions; treated with  (2) thalidomide (200 mg/d) and dexamethasone (40 mg/d x4d Q28d) June through Nov 2007, with good response, but poor tolerance;  (3) thalidomide decreased to 100 mg/d, dexamethasone continued, bortezomib (IV) added, Dec 2007 to March 2008  (4) treatment  interrupted by multiple complications (peripheral neuropathy, pulmonary embolism, diverticular abscess, CN V zoster, severe DDD, congestive heart failure, rising PSA); maintenance zolendronic acid through March 2012  (5) progression April 2012, treated initially with dexamethasone alone, poorly tolerated;  (6) bortezomib (sq) resumed July 2012; cyclophosphamide and dexamethasone added Sept 2012; zolendronic acid changed to Q 3months   Plan:  We are going to continue with the current plan (bortezomib sq at 1.5 mg/m2 days 1,8 and 15 of each 28 day cycle; cyclophosphamide at 300 mg/m2 weekly, taken as 300 mg by mouth every Saturday and Sunday; dexamethasone 12 mg every Saturday and Sunday; zolendronic acid Q 3 months).  Next cycle will start Dec 5th. We will do extensive labwork day 1 of each cycle, visit day 8 to review results. Once we establish plateau response, perhaps after another 3 months or so, we can consider switching to lenalidomide alone.  MAGRINAT,GUSTAV C 10/18/2011

## 2011-10-18 NOTE — Assessment & Plan Note (Signed)
S/p CABG 2001

## 2011-10-18 NOTE — Telephone Encounter (Signed)
gv pt appt for [id number]

## 2011-10-25 ENCOUNTER — Other Ambulatory Visit (HOSPITAL_BASED_OUTPATIENT_CLINIC_OR_DEPARTMENT_OTHER): Payer: Medicare Other

## 2011-10-25 ENCOUNTER — Ambulatory Visit (HOSPITAL_BASED_OUTPATIENT_CLINIC_OR_DEPARTMENT_OTHER): Payer: Medicare Other

## 2011-10-25 ENCOUNTER — Other Ambulatory Visit: Payer: Self-pay | Admitting: *Deleted

## 2011-10-25 DIAGNOSIS — D539 Nutritional anemia, unspecified: Secondary | ICD-10-CM

## 2011-10-25 DIAGNOSIS — C9000 Multiple myeloma not having achieved remission: Secondary | ICD-10-CM

## 2011-10-25 DIAGNOSIS — Z5112 Encounter for antineoplastic immunotherapy: Secondary | ICD-10-CM

## 2011-10-25 LAB — CBC WITH DIFFERENTIAL/PLATELET
Eosinophils Absolute: 0 10*3/uL (ref 0.0–0.5)
LYMPH%: 11.3 % — ABNORMAL LOW (ref 14.0–49.0)
MONO#: 1 10*3/uL — ABNORMAL HIGH (ref 0.1–0.9)
NEUT#: 4 10*3/uL (ref 1.5–6.5)
Platelets: 104 10*3/uL — ABNORMAL LOW (ref 140–400)
RBC: 2.42 10*6/uL — ABNORMAL LOW (ref 4.20–5.82)
WBC: 5.6 10*3/uL (ref 4.0–10.3)
lymph#: 0.6 10*3/uL — ABNORMAL LOW (ref 0.9–3.3)

## 2011-10-25 LAB — PROTEIN / CREATININE RATIO, URINE
Creatinine, Urine: 48.8 mg/dL
Total Protein, Urine: 6 mg/dL

## 2011-10-25 MED ORDER — ONDANSETRON HCL 8 MG PO TABS
8.0000 mg | ORAL_TABLET | Freq: Once | ORAL | Status: AC
  Administered 2011-10-25: 8 mg via ORAL

## 2011-10-25 MED ORDER — BORTEZOMIB CHEMO SQ INJECTION 3.5 MG (2.5MG/ML)
3.1000 mg | Freq: Once | INTRAMUSCULAR | Status: AC
  Administered 2011-10-25: 3 mg via SUBCUTANEOUS
  Filled 2011-10-25: qty 3

## 2011-10-25 MED ORDER — CYCLOPHOSPHAMIDE 25 MG PO TABS: 300.0000 mg | ORAL_TABLET | ORAL | Status: DC

## 2011-10-26 LAB — COMPREHENSIVE METABOLIC PANEL WITH GFR
ALT: 26 U/L (ref 0–53)
AST: 38 U/L — ABNORMAL HIGH (ref 0–37)
Albumin: 3.6 g/dL (ref 3.5–5.2)
Alkaline Phosphatase: 53 U/L (ref 39–117)
BUN: 30 mg/dL — ABNORMAL HIGH (ref 6–23)
CO2: 19 meq/L (ref 19–32)
Calcium: 8.8 mg/dL (ref 8.4–10.5)
Chloride: 103 meq/L (ref 96–112)
Creatinine, Ser: 1.18 mg/dL (ref 0.50–1.35)
Glucose, Bld: 104 mg/dL — ABNORMAL HIGH (ref 70–99)
Potassium: 4.7 meq/L (ref 3.5–5.3)
Sodium: 129 meq/L — ABNORMAL LOW (ref 135–145)
Total Bilirubin: 0.7 mg/dL (ref 0.3–1.2)
Total Protein: 5.6 g/dL — ABNORMAL LOW (ref 6.0–8.3)

## 2011-10-26 LAB — KAPPA/LAMBDA LIGHT CHAINS
Kappa free light chain: 24.4 mg/dL — ABNORMAL HIGH (ref 0.33–1.94)
Kappa:Lambda Ratio: 18.21 — ABNORMAL HIGH (ref 0.26–1.65)
Lambda Free Lght Chn: 1.34 mg/dL (ref 0.57–2.63)

## 2011-10-31 ENCOUNTER — Telehealth: Payer: Self-pay | Admitting: Oncology

## 2011-10-31 ENCOUNTER — Encounter: Payer: Self-pay | Admitting: *Deleted

## 2011-10-31 NOTE — Telephone Encounter (Signed)
Cyclophosphamide has been approved through [facility] from 10/10/11-10/30/14

## 2011-10-31 NOTE — Progress Notes (Unsigned)
This RN called to [facility] [place]

## 2011-11-01 ENCOUNTER — Other Ambulatory Visit (HOSPITAL_BASED_OUTPATIENT_CLINIC_OR_DEPARTMENT_OTHER): Payer: Medicare Other

## 2011-11-01 ENCOUNTER — Ambulatory Visit (HOSPITAL_BASED_OUTPATIENT_CLINIC_OR_DEPARTMENT_OTHER): Payer: Medicare Other

## 2011-11-01 ENCOUNTER — Encounter: Payer: Self-pay | Admitting: Physician Assistant

## 2011-11-01 ENCOUNTER — Ambulatory Visit (HOSPITAL_BASED_OUTPATIENT_CLINIC_OR_DEPARTMENT_OTHER): Payer: Medicare Other | Admitting: Physician Assistant

## 2011-11-01 ENCOUNTER — Telehealth: Payer: Self-pay | Admitting: *Deleted

## 2011-11-01 DIAGNOSIS — I10 Essential (primary) hypertension: Secondary | ICD-10-CM

## 2011-11-01 DIAGNOSIS — N19 Unspecified kidney failure: Secondary | ICD-10-CM

## 2011-11-01 DIAGNOSIS — C9000 Multiple myeloma not having achieved remission: Secondary | ICD-10-CM

## 2011-11-01 DIAGNOSIS — D649 Anemia, unspecified: Secondary | ICD-10-CM

## 2011-11-01 DIAGNOSIS — N289 Disorder of kidney and ureter, unspecified: Secondary | ICD-10-CM

## 2011-11-01 DIAGNOSIS — Z5112 Encounter for antineoplastic immunotherapy: Secondary | ICD-10-CM

## 2011-11-01 LAB — CBC WITH DIFFERENTIAL/PLATELET
BASO%: 0.5 % (ref 0.0–2.0)
EOS%: 0 % (ref 0.0–7.0)
LYMPH%: 15.5 % (ref 14.0–49.0)
MCHC: 34.7 g/dL (ref 32.0–36.0)
MONO#: 0.8 10*3/uL (ref 0.1–0.9)
MONO%: 19.2 % — ABNORMAL HIGH (ref 0.0–14.0)
Platelets: 89 10*3/uL — ABNORMAL LOW (ref 140–400)
RBC: 2.53 10*6/uL — ABNORMAL LOW (ref 4.20–5.82)
WBC: 4.4 10*3/uL (ref 4.0–10.3)
nRBC: 0 % (ref 0–0)

## 2011-11-01 MED ORDER — ONDANSETRON HCL 8 MG PO TABS
8.0000 mg | ORAL_TABLET | Freq: Once | ORAL | Status: AC
  Administered 2011-11-01: 8 mg via ORAL

## 2011-11-01 MED ORDER — DARBEPOETIN ALFA-POLYSORBATE 200 MCG/0.4ML IJ SOLN: 200.0000 ug | Freq: Once | INTRAMUSCULAR | Status: DC

## 2011-11-01 MED ORDER — BORTEZOMIB CHEMO SQ INJECTION 3.5 MG (2.5MG/ML)
1.5000 mg/m2 | Freq: Once | INTRAMUSCULAR | Status: AC
  Administered 2011-11-01: 3 mg via SUBCUTANEOUS
  Filled 2011-11-01: qty 3

## 2011-11-01 MED ORDER — DARBEPOETIN ALFA-POLYSORBATE 100 MCG/0.5ML IJ SOLN
100.0000 ug | Freq: Once | INTRAMUSCULAR | Status: AC
  Administered 2011-11-01: 100 ug via SUBCUTANEOUS
  Filled 2011-11-01: qty 0.5

## 2011-11-01 NOTE — Progress Notes (Signed)
Hematology and Oncology Follow Up Visit  Timothy [name] [id number] 01-Jan-1928 76 y.o. 11/01/2011    HPI: Patient presented in April 2007 with symptoms of symptomatic slowly progressive anemia with iron parameters, folic acid, and vitamin B12 within normal limits.   Plans were made to obtain bone marrow aspirate and biopsy as an outpatient at that time. Unfortunately the patient was hospitalized shortly thereafter with acute on chronic renal failure with a creatinine of 8.6. Renal biopsy demonstrated the presence of past nephropathy and light chain deposition disease. Biopsy demonstrated 37% plasma cells by aspirate differential. A skeletal survey demonstrated small calvarial lytic lesions, mild osteopenia, and cervical spondylosis.  Patient was treated from June of 2007 through November of 2007 with thalidomide, 200 mg daily, and dexamethasone at 40 mg on days one through 4 of each 28 day cycle. The patient achieved a partial response with this 2 drug therapy, free kappa light chain concentration decreasing to 42.2 by November 2007.  The thalidomide dose was then decreased due to progressive toxicities. Bortezomib was added and dexamethasone was continued. With patient achieving near normalization of his serum free light chain level by January 2008.  There is disease progression in April 2012, and patient was treated with 20 mg of dexamethasone on Mondays and Tuesdays of each week. This was discontinued due to problems with insomnia. Single agent bortesomib was started on 05/31/2011, given subcutaneously on days 18 and 15 of each 28 day cycle.  Cyclophosphamide and dexamethasone were added again in September 2012, both drugs given orally every Saturday and Sunday, with the continuation of subcutaneous bortezomib as before.  Patient also receive zoledronic acid, most recently given on a every 3 month basis.  Interim History:   Patient returns today for followup of his multiple myeloma with  associated anemia of renal failure. His energy level is fair, although he does get tired quickly with activity. He does have some mild shortness of breath with exertion as well. He continues to have back pain and left leg pain. He is received epidural injections but found minimal relief. His biggest complaint continues to be the neuropathy in both hands and feet. He is up to 1500 mg daily of gabapentin, again with minimal relief. He occasionally takes a tablet of dilaudid, especially at night, to ease this discomfort. Thus far this has been very effective. Of note patient denies any signs of abnormal bleeding whatsoever.  A detailed review of systems is otherwise noncontributory as noted below.  Review of Systems: Constitutional:  Fatigued, no weight loss, fever, night sweats Eyes: negative WUJ:[id number] Cardiovascular: no chest pain or dyspnea on exertion Respiratory: no cough, shortness of breath, or wheezing Neurological: positive for - numbness/tingling Dermatological: negative Gastrointestinal: no abdominal pain, change in bowel habits, or black or bloody stools Genito-Urinary: negative Hematological and Lymphatic: negative Breast: negative Musculoskeletal: positive for - pain in back - generalized and leg - left Remaining ROS negative.   Medications: I have reviewed the patient's current medications. Current Outpatient Prescriptions  Medication Sig Dispense Refill  . acetaminophen (TYLENOL) 500 MG tablet Take 500 mg by mouth as needed.        Marland Kitchen allopurinol (ZYLOPRIM) 300 MG tablet TAKE 1 TABLET DAILY  90 tablet  3  . aspirin 81 MG tablet Take 81 mg by mouth daily.        Marland Kitchen atorvastatin (LIPITOR) 40 MG tablet Take 1 tablet (40 mg total) by mouth daily.  90 tablet  3  . B Complex-Biotin-FA (B COMPLETE  PO) Take 1 tablet by mouth daily.        . bortezomib SQ (VELCADE) 3.5 MG SOLR Inject 1.3 mg/m2 into the skin once. As directed.       . carvedilol (COREG) 3.125 MG tablet Take 1 tablet  (3.125 mg total) by mouth 2 (two) times daily with a meal.  180 tablet  3  . Coenzyme Q10 (CO Q-10 PO) Take 1 tablet by mouth daily.        . cyclophosphamide (CYTOXAN) 25 MG tablet Take 12 tablets (300 mg total) by mouth as directed. Give on an empty stomach 1 hour before or 2 hours after meals.  Pt is to take 300mg  x 2 days each week  48 tablet  6  . dexamethasone (DECADRON) 4 MG tablet Take 4 mg by mouth 2 (two) times daily with a meal. Take after chemo       . GABAPENTIN PO Take 1,500 mg by mouth daily. titrating up dose as directed      . hydrochlorothiazide (HYDRODIURIL) 25 MG tablet Take 25 mg by mouth Twice daily.      Marland Kitchen HYDROmorphone (DILAUDID) 4 MG tablet Take 1 tablet by mouth as needed.       Marland Kitchen lisinopril (PRINIVIL,ZESTRIL) 20 MG tablet Take 1 tablet by mouth Daily.        . multivitamin (THERAGRAN) per tablet Take 1 tablet by mouth daily.        . Omega-3 Fatty Acids (FISH OIL PO) Take 1 capsule by mouth 2 (two) times daily.        Marland Kitchen triamterene-hydrochlorothiazide (DYAZIDE) 37.5-25 MG per capsule Take 1 tablet by mouth Daily. Take 1 and 1/2 daily      . valACYclovir (VALTREX) 500 MG tablet Take 500 mg by mouth daily.         Current Facility-Administered Medications  Medication Dose Route Frequency Provider Last Rate Last Dose  . darbepoetin (ARANESP) injection 100 mcg  100 mcg Subcutaneous Once Zollie Scale, PA   100 mcg at 11/01/11 1220  . DISCONTD: darbepoetin (ARANESP) injection 200 mcg  200 mcg Subcutaneous Once Zollie Scale, PA       Facility-Administered Medications Ordered in Other Visits  Medication Dose Route Frequency Provider Last Rate Last Dose  . bortezomib SQ (VELCADE) chemo injection 3 mg  1.5 mg/m2 (Treatment Plan Actual) Subcutaneous Once [name], MD   3 mg at 11/01/11 1214  . ondansetron (ZOFRAN) tablet 8 mg  8 mg Oral Once [name], MD   8 mg at 11/01/11 1138    Allergies: No Known Allergies  Physical Exam: Filed Vitals:   11/01/11 1007    BP: 110/61  Pulse: 64  Temp: 98 F (36.7 C)   HEENT:  Sclerae anicteric, conjunctivae pink.  Oropharynx clear.  No mucositis or candidiasis.   Nodes:  No cervical, supraclavicular, or axillary lymphadenopathy palpated.  Lungs:  Clear to auscultation bilaterally.  No crackles, rhonchi, or wheezes.   Heart:  Regular rate and rhythm.   Abdomen:  Soft, nontender.  Positive bowel sounds.  No organomegaly or masses palpated.   Musculoskeletal:  No focal spinal tenderness to palpation.  Extremities:  Chronic, nonpitting edema bilaterally in the lower extremities, with compression stockings in place. No peripheral cyanosis.   Skin:  Benign.   Neuro:  Nonfocal, although patient reports significant peripheral neuropathy in both the upper and lower extremities.   Lab Results: Lab Results  Component Value Date   WBC 4.4 11/01/2011  HGB 8.5* 11/01/2011   HCT 24.5* 11/01/2011   MCV 96.8 11/01/2011   PLT 89* 11/01/2011   NEUTROABS 2.8 11/01/2011     Chemistry      Component Value Date/Time   NA 129* 10/25/2011 1025   K 4.7 10/25/2011 1025   CL 103 10/25/2011 1025   CO2 19 10/25/2011 1025   BUN 30* 10/25/2011 1025   CREATININE 1.18 10/25/2011 1025      Component Value Date/Time   CALCIUM 8.8 10/25/2011 1025   ALKPHOS 53 10/25/2011 1025   AST 38* 10/25/2011 1025   ALT 26 10/25/2011 1025   BILITOT 0.7 10/25/2011 1025        Radiological Studies:  No results found.   Assessment:  77 year-old with kappa light chain multiple myeloma  (1) presenting April 2007 with anemia and renal failure; renal Bx showing free kappa light chain deposition; bone marrow biopsy showing 37% plasma cells, with normal cytogenetics and FISH;bone survey showing skull lytic lesions; treated with  (2) thalidomide (200 mg/d) and dexamethasone (40 mg/d x4d Q28d) June through Nov 2007, with good response, but poor tolerance;  (3) thalidomide decreased to 100 mg/d, dexamethasone continued, bortezomib (IV) added, Dec  2007 to March 2008  (4) treatment interrupted by multiple complications (peripheral neuropathy, pulmonary embolism, diverticular abscess, CN V zoster, severe DDD, congestive heart failure, rising PSA); maintenance zolendronic acid through March 2012  (5) progression April 2012, treated initially with dexamethasone alone, poorly tolerated;  (6) bortezomib (sq) resumed July 2012; cyclophosphamide and dexamethasone added Sept 2012; zolendronic acid changed to Q 3months   Plan:  The patient will proceed to treatment today as scheduled for day 8 of subcutaneous bortezomib. He will continue on the oral Cytoxan and dexamethasone, both taken on Saturday and Sunday of each week. He'll return next week for day 15 of this cycle on December 19. Her return 2 weeks later 2 initiate day 1 of the next cycle on January 2, and will be due for his next zoledronic acid at that time as well.  This case has been reviewed with Dr. Darnelle Catalan who was reviewed the patient's labs today. We are pleased with the decrease in the kappa light chains. He was a little concerned about the patient's dropping hemoglobin today, and he does seem to be getting creasingly symptomatic. Accordingly, we will initiate Aranesp for anemia of renal failure. He'll receive 100 mg today, 200 mg next week, then 200 mg every other week for total of 4-6 doses. (We will try to coordinate this with day 1 and day 15 of each cycle to eliminate extra visits.) We did go over possible side effects and toxicities associated with the Aranesp, and the patient has signed the release today.   This plan was reviewed with the patient, who voices understanding and agreement.  She knows to call with any changes or problems.    Maitlyn Penza, PA-C 11/01/2011

## 2011-11-01 NOTE — Patient Instructions (Signed)
Pt discharged to home with wife. Aranesp teaching reinforced. Pt understands to call office for adverse reaction to injection.

## 2011-11-01 NOTE — Telephone Encounter (Signed)
added on the doctor appointment 11-2011 printed out calendar and gave to the patient

## 2011-11-02 ENCOUNTER — Other Ambulatory Visit: Payer: Self-pay | Admitting: Physician Assistant

## 2011-11-06 ENCOUNTER — Other Ambulatory Visit: Payer: Self-pay | Admitting: *Deleted

## 2011-11-06 DIAGNOSIS — D649 Anemia, unspecified: Secondary | ICD-10-CM

## 2011-11-06 DIAGNOSIS — C9000 Multiple myeloma not having achieved remission: Secondary | ICD-10-CM

## 2011-11-06 MED ORDER — ACETAMINOPHEN 325 MG PO TABS: 650.0000 mg | ORAL_TABLET | Freq: Once | ORAL | Status: DC

## 2011-11-06 MED ORDER — DIPHENHYDRAMINE HCL 50 MG/ML IJ SOLN: 12.5000 mg | Freq: Once | INTRAMUSCULAR | Status: DC

## 2011-11-06 NOTE — Progress Notes (Signed)
Pt called to state he feels " like I need a blood transfusion "

## 2011-11-07 ENCOUNTER — Other Ambulatory Visit (HOSPITAL_BASED_OUTPATIENT_CLINIC_OR_DEPARTMENT_OTHER): Payer: Medicare Other

## 2011-11-07 ENCOUNTER — Encounter (HOSPITAL_COMMUNITY)
Admission: RE | Admit: 2011-11-07 | Discharge: 2011-11-07 | Disposition: A | Discharge status: Home / Self Care | Payer: Medicare Other | Source: Ambulatory Visit | Attending: Oncology | Admitting: Oncology

## 2011-11-07 ENCOUNTER — Ambulatory Visit: Payer: Medicare Other

## 2011-11-07 ENCOUNTER — Encounter (HOSPITAL_COMMUNITY): Admission: RE | Admit: 2011-11-07 | Payer: Medicare Other | Source: Ambulatory Visit

## 2011-11-07 DIAGNOSIS — D649 Anemia, unspecified: Secondary | ICD-10-CM | POA: Insufficient documentation

## 2011-11-07 DIAGNOSIS — C9000 Multiple myeloma not having achieved remission: Secondary | ICD-10-CM | POA: Insufficient documentation

## 2011-11-07 DIAGNOSIS — Z79899 Other long term (current) drug therapy: Secondary | ICD-10-CM | POA: Insufficient documentation

## 2011-11-07 LAB — CBC WITH DIFFERENTIAL/PLATELET
EOS%: 0 % (ref 0.0–7.0)
MCH: 35.4 pg — ABNORMAL HIGH (ref 27.2–33.4)
MCHC: 34.4 g/dL (ref 32.0–36.0)
MCV: 103.1 fL — ABNORMAL HIGH (ref 79.3–98.0)
MONO%: 13 % (ref 0.0–14.0)
RBC: 2.7 10*6/uL — ABNORMAL LOW (ref 4.20–5.82)
RDW: 17.5 % — ABNORMAL HIGH (ref 11.0–14.6)

## 2011-11-08 ENCOUNTER — Other Ambulatory Visit: Payer: Self-pay | Admitting: *Deleted

## 2011-11-08 ENCOUNTER — Other Ambulatory Visit: Payer: Medicare Other | Admitting: Lab

## 2011-11-08 ENCOUNTER — Encounter (HOSPITAL_COMMUNITY)
Admission: RE | Admit: 2011-11-08 | Discharge: 2011-11-08 | Disposition: A | Discharge status: Home / Self Care | Payer: Medicare Other | Source: Ambulatory Visit | Attending: Oncology | Admitting: Oncology

## 2011-11-08 ENCOUNTER — Ambulatory Visit (INDEPENDENT_AMBULATORY_CARE_PROVIDER_SITE_OTHER): Payer: Medicare Other | Admitting: Cardiology

## 2011-11-08 ENCOUNTER — Encounter: Payer: Self-pay | Admitting: Cardiology

## 2011-11-08 ENCOUNTER — Ambulatory Visit (HOSPITAL_BASED_OUTPATIENT_CLINIC_OR_DEPARTMENT_OTHER): Payer: Medicare Other

## 2011-11-08 VITALS — BP 114/58 | HR 50 | Ht 70.0 in | Wt 188.0 lb

## 2011-11-08 DIAGNOSIS — C9000 Multiple myeloma not having achieved remission: Secondary | ICD-10-CM

## 2011-11-08 DIAGNOSIS — I255 Ischemic cardiomyopathy: Secondary | ICD-10-CM

## 2011-11-08 DIAGNOSIS — Z951 Presence of aortocoronary bypass graft: Secondary | ICD-10-CM

## 2011-11-08 DIAGNOSIS — D649 Anemia, unspecified: Secondary | ICD-10-CM

## 2011-11-08 DIAGNOSIS — N289 Disorder of kidney and ureter, unspecified: Secondary | ICD-10-CM

## 2011-11-08 DIAGNOSIS — I2589 Other forms of chronic ischemic heart disease: Secondary | ICD-10-CM

## 2011-11-08 DIAGNOSIS — I10 Essential (primary) hypertension: Secondary | ICD-10-CM

## 2011-11-08 DIAGNOSIS — I5022 Chronic systolic (congestive) heart failure: Secondary | ICD-10-CM

## 2011-11-08 DIAGNOSIS — I2510 Atherosclerotic heart disease of native coronary artery without angina pectoris: Secondary | ICD-10-CM

## 2011-11-08 DIAGNOSIS — I509 Heart failure, unspecified: Secondary | ICD-10-CM

## 2011-11-08 DIAGNOSIS — IMO0001 Reserved for inherently not codable concepts without codable children: Secondary | ICD-10-CM

## 2011-11-08 DIAGNOSIS — Z5112 Encounter for antineoplastic immunotherapy: Secondary | ICD-10-CM

## 2011-11-08 LAB — TYPE AND SCREEN
ABO/RH(D): B NEG
Antibody Screen: NEGATIVE
Unit division: 0

## 2011-11-08 MED ORDER — ONDANSETRON HCL 8 MG PO TABS
8.0000 mg | ORAL_TABLET | Freq: Once | ORAL | Status: AC
  Administered 2011-11-08: 8 mg via ORAL

## 2011-11-08 MED ORDER — ACETAMINOPHEN 325 MG PO TABS
650.0000 mg | ORAL_TABLET | Freq: Once | ORAL | Status: AC
  Administered 2011-11-08: 650 mg via ORAL

## 2011-11-08 MED ORDER — DIPHENHYDRAMINE HCL 50 MG/ML IJ SOLN
12.5000 mg | Freq: Once | INTRAMUSCULAR | Status: AC
  Administered 2011-11-08: 12.5 mg via INTRAVENOUS

## 2011-11-08 MED ORDER — BORTEZOMIB CHEMO SQ INJECTION 3.5 MG (2.5MG/ML)
1.5000 mg/m2 | Freq: Once | INTRAMUSCULAR | Status: AC
  Administered 2011-11-08: 3 mg via SUBCUTANEOUS
  Filled 2011-11-08: qty 3

## 2011-11-08 MED ORDER — LISINOPRIL 20 MG PO TABS: 20.0000 mg | ORAL_TABLET | Freq: Every day | ORAL | Status: DC

## 2011-11-08 NOTE — Patient Instructions (Addendum)
Continue your medication.  I would reduce your salt intake and see if you can reduce the HCTZ to one a day.  I will see you again in 4 months.

## 2011-11-08 NOTE — Progress Notes (Signed)
Timothy [name] Date of Birth: 12-29-1927   History of Present Illness: Dr. [name] is seen today for a follow up visit.  He is a former patient of Dr. Ronnald Nian.  No real complaint from his cardiac standpoint. No chest pain. No real shortness of breath. Does complain of chronic fatigue which he relates to his chemotherapy and anemia. He is scheduled for transfusion later today. He is currently receiving chemotherapy for multiple myeloma. He does note some increase in edema which seems to have improved with an increase in his HCTZ dose. He notes that he was told to increase his salt intake because of his hyponatremia but then this resulted in increase in edema and increase in his diuretic dose. He has a history of coronary disease and is status post CABG by Dr. Charlann Boxer in 2001. This included an LIMA graft to LAD, saphenous vein graft to the intermediate, saphenous vein graft to the circumflex, and saphenous vein graft to the posterior lateral branch of the right coronary. He has an ischemic cardiomyopathy with most recent ejection fraction of 39%. His last nuclear stress test in June of 2012 and this showed a large inferior infarct without ischemia. Ejection fraction was 39%.  Current Outpatient Prescriptions on File Prior to Visit  Medication Sig Dispense Refill  . acetaminophen (TYLENOL) 500 MG tablet Take 500 mg by mouth as needed.        Marland Kitchen allopurinol (ZYLOPRIM) 300 MG tablet TAKE 1 TABLET DAILY  90 tablet  3  . aspirin 81 MG tablet Take 81 mg by mouth daily.        Marland Kitchen atorvastatin (LIPITOR) 40 MG tablet Take 1 tablet (40 mg total) by mouth daily.  90 tablet  3  . B Complex-Biotin-FA (B COMPLETE PO) Take 1 tablet by mouth daily.        . bortezomib SQ (VELCADE) 3.5 MG SOLR Inject 1.3 mg/m2 into the skin once. As directed.       . carvedilol (COREG) 3.125 MG tablet Take 1 tablet (3.125 mg total) by mouth 2 (two) times daily with a meal.  180 tablet  3  . Coenzyme Q10 (CO Q-10 PO) Take 1 tablet by  mouth daily.        . cyclophosphamide (CYTOXAN) 25 MG tablet Take 12 tablets (300 mg total) by mouth as directed. Give on an empty stomach 1 hour before or 2 hours after meals.  Pt is to take 300mg  x 2 days each week  48 tablet  6  . dexamethasone (DECADRON) 4 MG tablet Take 4 mg by mouth 2 (two) times daily with a meal. Take after chemo       . GABAPENTIN PO Take 1,500 mg by mouth daily. titrating up dose as directed      . hydrochlorothiazide (HYDRODIURIL) 25 MG tablet Take 25 mg by mouth Twice daily.      Marland Kitchen HYDROmorphone (DILAUDID) 4 MG tablet Take 1 tablet by mouth as needed.       . multivitamin (THERAGRAN) per tablet Take 1 tablet by mouth daily.        . Omega-3 Fatty Acids (FISH OIL PO) Take 1 capsule by mouth 2 (two) times daily.        . valACYclovir (VALTREX) 500 MG tablet Take 500 mg by mouth daily.        Marland Kitchen DISCONTD: lisinopril (PRINIVIL,ZESTRIL) 20 MG tablet Take 1 tablet by mouth Daily.        Marland Kitchen triamterene-hydrochlorothiazide (DYAZIDE) 37.5-25  MG per capsule Take 1 tablet by mouth Daily. Take 1 and 1/2 daily       Current Facility-Administered Medications on File Prior to Visit  Medication Dose Route Frequency Provider Last Rate Last Dose  . acetaminophen (TYLENOL) tablet 650 mg  650 mg Oral Once [name], MD      . diphenhydrAMINE (BENADRYL) injection 12.5 mg  12.5 mg Intravenous Once [name], MD        No Known Allergies  Past Medical History  Diagnosis Date  . ASCVD (arteriosclerotic cardiovascular disease)   . Anemia     chronic mild anemia  . Gout   . Hypertension   . Coronary artery disease     CABG 1991  . Lumbar disc disease     post laminectomy  . Chronic back pain   . HX: long term anticoagulant use   . History of echocardiogram 11/02/2010    EF range of 30 to 35% / There is hypokinsesis of the basal-mild inferolateral myocardium  . CHF (congestive heart failure)   . Heart murmur   . Ischemic cardiomyopathy   . Renal insufficiency    . Peripheral neuropathy   . Inferior myocardial infarction 1986  . Hyperlipidemia   . GERD (gastroesophageal reflux disease)   . SOB (shortness of breath)   . Chest pain   . Fatigue   . Myeloma     Multiple myeloma, with recurrence of increasing problems  . Cancer     Past Surgical History  Procedure Date  . Laminectomy   . Inguinal hernia repair   . Cardiac catheterization 06/20/2000    Severe coronary disease (totally occluded right artery, 90% left circumflex, 50-60% intermediate, and 90-95% ostial left anterior descending)   . Tonsillectomy   . Coronary artery bypass graft 06/22/2000    x4 / Left internal mammary artery to the LAD.  / Saphenous vein graft to the right posterolateral.  /  Saphenous vein graft to  the ramus intermedius. / Saphenous vein graft to the circumflex marginal       History  Smoking status  . Former Smoker -- 1.0 packs/day for 20 years  . Types: Cigarettes  . Quit date: 11/20/1984  Smokeless tobacco  . Never Used    History  Alcohol Use No    History reviewed. No pertinent family history.  Review of Systems: The review of systems is positive for multiple myeloma with chronic anemia. He is on Velcade and Cytoxan.  All other systems were reviewed and are negative.  Physical Exam: BP 114/58  Pulse 50  Ht 5\' 10"  (1.778 m)  Wt 85.276 kg (188 lb)  BMI 26.98 kg/m2 Patient is very pleasant and in no acute distress. Skin is warm and dry. Color is normal.  HEENT is unremarkable. Normocephalic/atraumatic. PERRL. Sclera are nonicteric. Neck is supple. No masses. No JVD. Lungs are clear. Cardiac exam shows a regular rate and rhythm. Abdomen is soft. Extremities are without edema. Gait and ROM are intact. No gross neurologic deficits noted.   LABORATORY DATA: [facility]. Most recent hemoglobin was 8.5. Platelet count was 89,000. Sodium 129, potassium 4.7, BUN 30, creatinine 1.18. LFTs are normal.   Assessment / Plan:

## 2011-11-08 NOTE — Assessment & Plan Note (Signed)
He appears to be well compensated on exam today. His weight is stable. He does have some edema and wears support hose. I recommended he reduce his sodium intake. It doesn't make much sense to me for him to increase his sodium intake but then have to take extra diuretics for his edema which further lowers his sodium level. Ideally the solution for his hyponatremia would be to restrict his fluid intake but because of his chemotherapy he must take at least 2 L per day. We will continue with his other medications including his ACE inhibitor and beta blocker therapy and followup again in 4 months.

## 2011-11-08 NOTE — Patient Instructions (Signed)
Patient aware of next appointment; tolerated transfusion well; post vs stable; patient has meds for nausea at home.

## 2011-11-08 NOTE — Assessment & Plan Note (Signed)
He is status post CABG as noted. He is asymptomatic. His stress test in June of this year was unchanged with no evidence of ischemia. We'll continue with his risk factor modification.

## 2011-11-09 LAB — TYPE AND SCREEN: Unit division: 0

## 2011-11-22 ENCOUNTER — Other Ambulatory Visit: Payer: Medicare Other

## 2011-11-22 ENCOUNTER — Ambulatory Visit (HOSPITAL_BASED_OUTPATIENT_CLINIC_OR_DEPARTMENT_OTHER): Payer: Medicare Other

## 2011-11-22 DIAGNOSIS — D649 Anemia, unspecified: Secondary | ICD-10-CM

## 2011-11-22 DIAGNOSIS — N19 Unspecified kidney failure: Secondary | ICD-10-CM

## 2011-11-22 DIAGNOSIS — C9000 Multiple myeloma not having achieved remission: Secondary | ICD-10-CM

## 2011-11-22 DIAGNOSIS — N289 Disorder of kidney and ureter, unspecified: Secondary | ICD-10-CM

## 2011-11-22 DIAGNOSIS — Z5112 Encounter for antineoplastic immunotherapy: Secondary | ICD-10-CM

## 2011-11-22 LAB — CBC WITH DIFFERENTIAL/PLATELET
Eosinophils Absolute: 0 10*3/uL (ref 0.0–0.5)
HGB: 10.8 g/dL — ABNORMAL LOW (ref 13.0–17.1)
MONO#: 0.6 10*3/uL (ref 0.1–0.9)
NEUT#: 2.2 10*3/uL (ref 1.5–6.5)
RBC: 3.25 10*6/uL — ABNORMAL LOW (ref 4.20–5.82)
RDW: 16.2 % — ABNORMAL HIGH (ref 11.0–14.6)
WBC: 3.3 10*3/uL — ABNORMAL LOW (ref 4.0–10.3)
lymph#: 0.6 10*3/uL — ABNORMAL LOW (ref 0.9–3.3)

## 2011-11-22 MED ORDER — BORTEZOMIB CHEMO SQ INJECTION 3.5 MG (2.5MG/ML)
3.1000 mg | Freq: Once | INTRAMUSCULAR | Status: AC
  Administered 2011-11-22: 3 mg via SUBCUTANEOUS
  Filled 2011-11-22: qty 3

## 2011-11-22 MED ORDER — ONDANSETRON HCL 8 MG PO TABS
8.0000 mg | ORAL_TABLET | Freq: Once | ORAL | Status: AC
  Administered 2011-11-22: 8 mg via ORAL

## 2011-11-22 MED ORDER — DARBEPOETIN ALFA-POLYSORBATE 500 MCG/ML IJ SOLN
200.0000 ug | Freq: Once | INTRAMUSCULAR | Status: AC
  Administered 2011-11-22: 200 ug via SUBCUTANEOUS
  Filled 2011-11-22: qty 1

## 2011-11-23 LAB — COMPREHENSIVE METABOLIC PANEL
Albumin: 3.5 g/dL (ref 3.5–5.2)
Alkaline Phosphatase: 43 U/L (ref 39–117)
CO2: 24 mEq/L (ref 19–32)
Calcium: 9.1 mg/dL (ref 8.4–10.5)
Chloride: 102 mEq/L (ref 96–112)
Glucose, Bld: 113 mg/dL — ABNORMAL HIGH (ref 70–99)
Potassium: 4.2 mEq/L (ref 3.5–5.3)
Sodium: 136 mEq/L (ref 135–145)
Total Protein: 5.3 g/dL — ABNORMAL LOW (ref 6.0–8.3)

## 2011-11-23 LAB — PROTEIN / CREATININE RATIO, URINE
Creatinine, Urine: 113 mg/dL
Total Protein, Urine: 4 mg/dL

## 2011-11-23 LAB — KAPPA/LAMBDA LIGHT CHAINS: Kappa:Lambda Ratio: 9.62 — ABNORMAL HIGH (ref 0.26–1.65)

## 2011-11-29 ENCOUNTER — Ambulatory Visit (HOSPITAL_BASED_OUTPATIENT_CLINIC_OR_DEPARTMENT_OTHER): Payer: Medicare Other | Admitting: Oncology

## 2011-11-29 ENCOUNTER — Other Ambulatory Visit (HOSPITAL_BASED_OUTPATIENT_CLINIC_OR_DEPARTMENT_OTHER): Payer: Medicare Other | Admitting: Lab

## 2011-11-29 ENCOUNTER — Other Ambulatory Visit: Payer: Self-pay | Admitting: Oncology

## 2011-11-29 ENCOUNTER — Ambulatory Visit: Payer: Medicare Other

## 2011-11-29 ENCOUNTER — Ambulatory Visit (HOSPITAL_BASED_OUTPATIENT_CLINIC_OR_DEPARTMENT_OTHER): Payer: Medicare Other

## 2011-11-29 DIAGNOSIS — M549 Dorsalgia, unspecified: Secondary | ICD-10-CM

## 2011-11-29 DIAGNOSIS — C9000 Multiple myeloma not having achieved remission: Secondary | ICD-10-CM

## 2011-11-29 DIAGNOSIS — G609 Hereditary and idiopathic neuropathy, unspecified: Secondary | ICD-10-CM

## 2011-11-29 DIAGNOSIS — Z5112 Encounter for antineoplastic immunotherapy: Secondary | ICD-10-CM

## 2011-11-29 LAB — CBC WITH DIFFERENTIAL/PLATELET
Basophils Absolute: 0 10*3/uL (ref 0.0–0.1)
EOS%: 0 % (ref 0.0–7.0)
Eosinophils Absolute: 0 10*3/uL (ref 0.0–0.5)
HGB: 11.2 g/dL — ABNORMAL LOW (ref 13.0–17.1)
MCV: 97.6 fL (ref 79.3–98.0)
MONO%: 15.5 % — ABNORMAL HIGH (ref 0.0–14.0)
NEUT#: 3.1 10*3/uL (ref 1.5–6.5)
RBC: 3.36 10*6/uL — ABNORMAL LOW (ref 4.20–5.82)
RDW: 16.7 % — ABNORMAL HIGH (ref 11.0–14.6)
lymph#: 0.6 10*3/uL — ABNORMAL LOW (ref 0.9–3.3)
nRBC: 0 % (ref 0–0)

## 2011-11-29 MED ORDER — BORTEZOMIB CHEMO SQ INJECTION 3.5 MG (2.5MG/ML)
3.1000 mg | Freq: Once | INTRAMUSCULAR | Status: AC
  Administered 2011-11-29: 3 mg via SUBCUTANEOUS
  Filled 2011-11-29: qty 3

## 2011-11-29 MED ORDER — ZOLEDRONIC ACID 4 MG/5ML IV CONC
3.0000 mg | Freq: Once | INTRAVENOUS | Status: AC
  Administered 2011-11-29: 3 mg via INTRAVENOUS
  Filled 2011-11-29: qty 3.75

## 2011-11-29 MED ORDER — ONDANSETRON HCL 8 MG PO TABS
8.0000 mg | ORAL_TABLET | Freq: Once | ORAL | Status: AC
  Administered 2011-11-29: 8 mg via ORAL

## 2011-11-29 NOTE — Patient Instructions (Signed)
Patient ambulatory out of clinic without complaints.  Instructed patient to call with any issues.  Patient aware of next appointment 

## 2011-11-29 NOTE — Progress Notes (Signed)
Patient ID: Timothy [name], male   DOB: 1927/12/30, 78 y.o.   MRN: [id number] Hematology and Oncology Follow Up Visit  Timothy [name] [id number] [birth year] 78 y.o. 11/29/2011    HPI: Patient presented in April 2007 with symptoms of symptomatic slowly progressive anemia with iron parameters, folic acid, and vitamin B12 within normal limits.   Plans were made to obtain bone marrow aspirate and biopsy as an outpatient at that time. Unfortunately the patient was hospitalized shortly thereafter with acute on chronic renal failure with a creatinine of 8.6. Renal biopsy demonstrated the presence of past nephropathy and light chain deposition disease. Biopsy demonstrated 37% plasma cells by aspirate differential. A skeletal survey demonstrated small calvarial lytic lesions, mild osteopenia, and cervical spondylosis.  Patient was treated from June of 2007 through November of 2007 with thalidomide, 200 mg daily, and dexamethasone at 40 mg on days one through 4 of each 28 day cycle. The patient achieved a partial response with this 2 drug therapy, free kappa light chain concentration decreasing to 42.2 by November 2007.  The thalidomide dose was then decreased due to progressive toxicities. Bortezomib was added and dexamethasone was continued. With patient achieving near normalization of his serum free light chain level by January 2008.  There is disease progression in April 2012, and patient was treated with 20 mg of dexamethasone on Mondays and Tuesdays of each week. This was discontinued due to problems with insomnia. Single agent bortesomib was started on 05/31/2011, given subcutaneously on days 18 and 15 of each 28 day cycle.  Cyclophosphamide and dexamethasone were added again in September 2012, both drugs given orally every Saturday and Sunday, with the continuation of subcutaneous bortezomib as before.  Patient also receive zoledronic acid, most recently given on a every 3 month basis.  Interim  History:   Date returns today with his wife Timothy [name] for followup of his multiple myeloma. He is tolerating treatment generally well, other than the peripheral neuropathy problem. This really bothers him. It involves chiefly his feet, and if they're "all the time". There have been no falls although sometimes he feels like he might fall. He is able to play golf once or twice a week and of course he also does some mowing another chores in his garden. He does not believe that neuropathy is worse and perhaps 2 months ago.  Aside from that he continues to have significant back pain. He had epidural treatments which initially helped but now he is very discouraged and basically just uses the diluted as needed. Some days he doesn't use any, some days she uses one or 2 tablets, particularly on days when he is playing golf.  He feels the cyclophosphamide, which she takes orally on Saturdays and Sundays, cause him some loose stools. These have not been a major issue however. He bruises easily, but there have been no bleeding episodes. He recently cut his scalp while doing some work under the house. He tells me he has had a tetanus shot within the last 10 years. Otherwise a detailed review of systems was noncontributory.  Review of Systems: Constitutional:  Fatigued, no weight loss, fever, night sweats Eyes: negative [id number]:[id number] Cardiovascular: no chest pain or dyspnea on exertion Respiratory: no cough, shortness of breath, or wheezing Neurological: peripheral neuropathy as described Dermatological: negative Gastrointestinal: some loose bowel movements with cyclophosphamide as noted, but no black or bloody stools Genito-Urinary: stage Hematological and Lymphatic: negative Breast: n/a Musculoskeletal: positive for - pain in back - unchanged Remaining ROS  negative.   Medications: I have reviewed the patient's current medications. Current Outpatient Prescriptions  Medication Sig Dispense Refill  .  acetaminophen (TYLENOL) 500 MG tablet Take 500 mg by mouth as needed.        Marland Kitchen allopurinol (ZYLOPRIM) 300 MG tablet TAKE 1 TABLET DAILY  90 tablet  3  . aspirin 81 MG tablet Take 81 mg by mouth daily.        Marland Kitchen atorvastatin (LIPITOR) 40 MG tablet Take 1 tablet (40 mg total) by mouth daily.  90 tablet  3  . B Complex-Biotin-FA (B COMPLETE PO) Take 1 tablet by mouth daily.        . bortezomib SQ (VELCADE) 3.5 MG SOLR Inject 1.3 mg/m2 into the skin once. As directed.       . carvedilol (COREG) 3.125 MG tablet Take 1 tablet (3.125 mg total) by mouth 2 (two) times daily with a meal.  180 tablet  3  . Coenzyme Q10 (CO Q-10 PO) Take 1 tablet by mouth daily.        . cyclophosphamide (CYTOXAN) 25 MG tablet Take 12 tablets (300 mg total) by mouth as directed. Give on an empty stomach 1 hour before or 2 hours after meals.  Pt is to take 300mg  x 2 days each week  48 tablet  6  . dexamethasone (DECADRON) 4 MG tablet Take 4 mg by mouth 2 (two) times daily with a meal. Take after chemo       . GABAPENTIN PO Take 1,500 mg by mouth daily. titrating up dose as directed      . hydrochlorothiazide (HYDRODIURIL) 25 MG tablet Take 25 mg by mouth Twice daily.      Marland Kitchen HYDROmorphone (DILAUDID) 4 MG tablet Take 1 tablet by mouth as needed.       Marland Kitchen lisinopril (PRINIVIL,ZESTRIL) 20 MG tablet Take 1 tablet (20 mg total) by mouth daily.  90 tablet  3  . multivitamin (THERAGRAN) per tablet Take 1 tablet by mouth daily.        . Omega-3 Fatty Acids (FISH OIL PO) Take 1 capsule by mouth 2 (two) times daily.        Marland Kitchen triamterene-hydrochlorothiazide (DYAZIDE) 37.5-25 MG per capsule Take 1 tablet by mouth Daily. Take 1 and 1/2 daily      . valACYclovir (VALTREX) 500 MG tablet Take 500 mg by mouth daily.          Allergies: No Known Allergies  Physical Exam: Filed Vitals:   11/29/11 1058  BP: 133/62  Pulse: 62  Temp: 97.6 F (36.4 C)   HEENT:  Sclerae anicteric, conjunctivae pink.  Oropharynx clear.  No mucositis or  candidiasis.   Nodes:  No cervical, supraclavicular, or axillary lymphadenopathy palpated.  Lungs:  Clear to auscultation bilaterally.  No crackles, rhonchi, or wheezes.   Heart:  Regular rate and rhythm.   Abdomen:  Soft, nontender.  Positive bowel sounds.  No organomegaly or masses palpated.   Musculoskeletal:  No focal spinal tenderness to palpation.  Extremities:  Chronic, nonpitting edema bilaterally in the lower extremities, with compression stockings in place. No peripheral cyanosis.   Skin:  Benign.   Neuro:  Nonfocal, although patient reports significant peripheral neuropathy in both the upper and lower extremities.   Lab Results: the free kappa light chains in the serum have dropped to 22.6, down from 64.5 in October of 2012. The urine protein/creatinine ratio has dropped from 0.17 November of 2012 to 0.04 currently.  Lab Results  Component Value Date   WBC 4.3 11/29/2011   HGB 11.2* 11/29/2011   HCT 32.8* 11/29/2011   MCV 97.6 11/29/2011   PLT 107* 11/29/2011   NEUTROABS 3.1 11/29/2011     Chemistry      Component Value Date/Time   NA 136 11/22/2011 0954   K 4.2 11/22/2011 0954   CL 102 11/22/2011 0954   CO2 24 11/22/2011 0954   BUN 25* 11/22/2011 0954   CREATININE 1.12 11/22/2011 0954      Component Value Date/Time   CALCIUM 9.1 11/22/2011 0954   ALKPHOS 43 11/22/2011 0954   AST 37 11/22/2011 0954   ALT 29 11/22/2011 0954   BILITOT 0.9 11/22/2011 0954        Radiological Studies:  No new results found.   Assessment:  78 year-old with kappa light chain multiple myeloma  (1) presenting April 2007 with anemia and renal failure; renal Bx showing free kappa light chain deposition; bone marrow biopsy showing 37% plasma cells, with normal cytogenetics and FISH;bone survey showing skull lytic lesions; treated with  (2) thalidomide (200 mg/d) and dexamethasone (40 mg/d x4d Q28d) June through Nov 2007, with good response, but poor tolerance;  (3) thalidomide decreased to 100 mg/d, dexamethasone  continued, bortezomib (IV) added, Dec 2007 to March 2008  (4) treatment interrupted by multiple complications (peripheral neuropathy, pulmonary embolism, diverticular abscess, CN V zoster, severe DDD, congestive heart failure, rising PSA); maintenance zolendronic acid through March 2012  (5) progression April 2012, treated initially with dexamethasone alone, poorly tolerated;  (6) bortezomib (sq) resumed July 2012; cyclophosphamide and dexamethasone added Sept 2012; zolendronic acid changed to Q 3months (7) aranesp started December 2012   Plan:  I think he is having a very good response to the current treatment and the main concern I have is the continuing peripheral neuropathy. This does not seem to be worsening, but it certainly is not getting better. We are going to slacken up some on the bortezomib, and he will be treated day 1 and day 8 every 28 days, omitting the day 15 treatment. He will continue the cyclophosphamide weekly (he takes 300 mg on Saturdays and Sundays, with 12 mg of dexamethasone the same days). We will continue the area [place] 2 weeks for now, but if possible we will try to change it to once a month. We are also continuing the Zometa quarterly as before. We will continue to follow his urine protein creatinine ratio and kappa light chains since his SPEP is negative and his immunoglobulin levels have been normal (noninformative). I wish I had some resolution for his back pain, but I did refill his hydromorphone today. He knows to call for any problems that may develop before the next visit  MAGRINAT,GUSTAV C, PA-C 11/29/2011

## 2011-12-06 ENCOUNTER — Ambulatory Visit: Payer: Medicare Other

## 2011-12-06 ENCOUNTER — Other Ambulatory Visit: Payer: Medicare Other

## 2011-12-12 ENCOUNTER — Other Ambulatory Visit: Payer: Self-pay | Admitting: Oncology

## 2011-12-13 ENCOUNTER — Other Ambulatory Visit: Payer: Medicare Other

## 2011-12-13 ENCOUNTER — Ambulatory Visit: Payer: Medicare Other

## 2011-12-27 ENCOUNTER — Ambulatory Visit: Payer: Medicare Other | Admitting: Oncology

## 2011-12-27 ENCOUNTER — Encounter: Payer: Self-pay | Admitting: Oncology

## 2011-12-27 ENCOUNTER — Ambulatory Visit (HOSPITAL_BASED_OUTPATIENT_CLINIC_OR_DEPARTMENT_OTHER): Payer: Medicare Other

## 2011-12-27 ENCOUNTER — Ambulatory Visit: Payer: Medicare Other | Admitting: Physician Assistant

## 2011-12-27 ENCOUNTER — Other Ambulatory Visit: Payer: Medicare Other | Admitting: Lab

## 2011-12-27 DIAGNOSIS — C9000 Multiple myeloma not having achieved remission: Secondary | ICD-10-CM

## 2011-12-27 DIAGNOSIS — Z5111 Encounter for antineoplastic chemotherapy: Secondary | ICD-10-CM

## 2011-12-27 LAB — CBC WITH DIFFERENTIAL/PLATELET
BASO%: 0.4 % (ref 0.0–2.0)
EOS%: 0.5 % (ref 0.0–7.0)
Eosinophils Absolute: 0 10*3/uL (ref 0.0–0.5)
LYMPH%: 9.1 % — ABNORMAL LOW (ref 14.0–49.0)
MCH: 34.9 pg — ABNORMAL HIGH (ref 27.2–33.4)
MCHC: 34.6 g/dL (ref 32.0–36.0)
MCV: 100.9 fL — ABNORMAL HIGH (ref 79.3–98.0)
MONO%: 13.7 % (ref 0.0–14.0)
Platelets: 140 10*3/uL (ref 140–400)
RBC: 3.15 10*6/uL — ABNORMAL LOW (ref 4.20–5.82)

## 2011-12-27 LAB — PROTEIN / CREATININE RATIO, URINE
Protein Creatinine Ratio: 0.09 (ref <0.15)
Total Protein, Urine: 10 mg/dL

## 2011-12-27 MED ORDER — BORTEZOMIB CHEMO SQ INJECTION 3.5 MG (2.5MG/ML): 1.3000 mg/m2 | Freq: Once | INTRAMUSCULAR | Status: DC

## 2011-12-27 MED ORDER — BORTEZOMIB CHEMO SQ INJECTION 3.5 MG (2.5MG/ML)
1.5000 mg/m2 | Freq: Once | INTRAMUSCULAR | Status: AC
  Administered 2011-12-27: 3 mg via SUBCUTANEOUS
  Filled 2011-12-27: qty 3

## 2011-12-27 MED ORDER — ONDANSETRON HCL 8 MG PO TABS
8.0000 mg | ORAL_TABLET | Freq: Once | ORAL | Status: AC
  Administered 2011-12-27: 8 mg via ORAL

## 2011-12-27 NOTE — Progress Notes (Signed)
Cytoxan has been approved from 12/05/11-12/25/14 through [employer], [id number].

## 2011-12-27 NOTE — Progress Notes (Signed)
Patient ID: Timothy [name], male   DOB: 1928-10-13, 78 y.o.   MRN: [id number] Hematology and Oncology Follow Up Visit  Timothy [name] [id number] 1928/08/31 78 y.o. 12/27/2011    HPI: Patient presented in April 2007 with symptoms of symptomatic slowly progressive anemia with iron parameters, folic acid, and vitamin B12 within normal limits.   Plans were made to obtain bone marrow aspirate and biopsy as an outpatient at that time. Unfortunately the patient was hospitalized shortly thereafter with acute on chronic renal failure with a creatinine of 8.6. Renal biopsy demonstrated the presence of past nephropathy and light chain deposition disease. Biopsy demonstrated 37% plasma cells by aspirate differential. A skeletal survey demonstrated small calvarial lytic lesions, mild osteopenia, and cervical spondylosis.  Patient was treated from June of 2007 through November of 2007 with thalidomide, 200 mg daily, and dexamethasone at 40 mg on days one through 4 of each 28 day cycle. The patient achieved a partial response with this 2 drug therapy, free kappa light chain concentration decreasing to 42.2 by November 2007.  The thalidomide dose was then decreased due to progressive toxicities. Bortezomib was added and dexamethasone was continued. With patient achieving near normalization of his serum free light chain level by January 2008.  There is disease progression in April 2012, and patient was treated with 20 mg of dexamethasone on Mondays and Tuesdays of each week. This was discontinued due to problems with insomnia. Single agent bortesomib was started on 05/31/2011, given subcutaneously on days 18 and 15 of each 28 day cycle.  Cyclophosphamide and dexamethasone were added again in September 2012, both drugs given orally every Saturday and Sunday, with the continuation of subcutaneous bortezomib as before.  Patient also receive zoledronic acid, most recently given on a every 3 month basis.  Interim  History:   Timothy [name] returns today with his wife Timothy [name] for followup of his multiple myeloma. He is tolerating treatment well, with stable though bothersome peripheral neuropathy. Today he tells me that the second week in January he had a 4 day episode where he felt awful, had shaking chills, and intermittent temperatures up to 102, together with severe diarrhea. He never called [place] or any of his other physicians regarding this. I strongly encouraged him to let us know if he has any shaking chills or temperatures about 100.5 in the future. He understands I if he had had a bacterial infection instead of the viral infection he most likely suffered from  he might not make it the next time.  Review of Systems: His biggest problem continues to be very severe back pain. He has seen Dr. Danielle Dess regarding this. There is a new neurosurgeon in that group specializing in pain issues and I have suggested he contact him for a second opinion. Timothy [name] also is having some BPH problems. He apparently has some Flomax and available at home and I encouraged him to take that at bedtime the next few nights. Aside from these issues, the peripheral neuropathy is stable. He is not currently playing golf mostly because of the weather. A detailed review of systems was otherwise noncontributory   Medications: I have reviewed the patient's current medications. Current Outpatient Prescriptions  Medication Sig Dispense Refill  . acetaminophen (TYLENOL) 500 MG tablet Take 500 mg by mouth as needed.        Marland Kitchen allopurinol (ZYLOPRIM) 300 MG tablet TAKE 1 TABLET DAILY  90 tablet  3  . aspirin 81 MG tablet Take 81 mg by mouth daily.        Marland Kitchen  B Complex-Biotin-FA (B COMPLETE PO) Take 1 tablet by mouth daily.        . bortezomib SQ (VELCADE) 3.5 MG SOLR Inject 1.3 mg/m2 into the skin once. As directed.       . carvedilol (COREG) 3.125 MG tablet Take 1 tablet (3.125 mg total) by mouth 2 (two) times daily with a meal.  180 tablet  3  . Coenzyme Q10  (CO Q-10 PO) Take 1 tablet by mouth daily.        . cyclophosphamide (CYTOXAN) 25 MG tablet Take 12 tablets (300 mg total) by mouth as directed. Give on an empty stomach 1 hour before or 2 hours after meals.  Pt is to take 300mg  x 2 days each week  48 tablet  6  . dexamethasone (DECADRON) 4 MG tablet Take 4 mg by mouth 2 (two) times daily with a meal. Take after chemo       . GABAPENTIN PO Take 1,500 mg by mouth daily. titrating up dose as directed      . hydrochlorothiazide (HYDRODIURIL) 25 MG tablet Take 25 mg by mouth Twice daily.      Marland Kitchen HYDROmorphone (DILAUDID) 4 MG tablet Take 1 tablet by mouth as needed.       Marland Kitchen lisinopril (PRINIVIL,ZESTRIL) 20 MG tablet Take 1 tablet (20 mg total) by mouth daily.  90 tablet  3  . multivitamin (THERAGRAN) per tablet Take 1 tablet by mouth daily.        . Omega-3 Fatty Acids (FISH OIL PO) Take 1 capsule by mouth 2 (two) times daily.        Marland Kitchen triamterene-hydrochlorothiazide (DYAZIDE) 37.5-25 MG per capsule Take 1 tablet by mouth Daily. Take 1 and 1/2 daily      . valACYclovir (VALTREX) 500 MG tablet Take 500 mg by mouth daily.        Marland Kitchen atorvastatin (LIPITOR) 40 MG tablet Take 1 tablet (40 mg total) by mouth daily.  90 tablet  3    Allergies: No Known Allergies  Physical Exam: Filed Vitals:   12/27/11 1015  BP: 107/63  Pulse: 71   HEENT:  Sclerae anicteric, conjunctivae pink.  Oropharynx clear.  No mucositis or candidiasis.   Nodes:  No cervical, supraclavicular, or axillary lymphadenopathy palpated.  Lungs:  Clear to auscultation bilaterally.  No crackles, rhonchi, or wheezes.   Heart:  Regular rate and rhythm.   Abdomen:  Soft, nontender.  Positive bowel sounds.  No organomegaly or masses palpated.   Musculoskeletal:  No focal spinal tenderness to palpation.  Extremities:  Chronic, nonpitting edema bilaterally in the lower extremities, with compression stockings in place. No peripheral cyanosis.   Skin:  Benign.   Neuro:  Nonfocal, although  patient reports significant peripheral neuropathy in both the upper and lower extremities.   Lab Results: the free kappa light chains in the serum have dropped to 22.6, down from 64.5 in October of 2012. The urine protein/creatinine ratio has dropped from 0.17 November of 2012 to 0.04 currently.  Lab Results  Component Value Date   WBC 3.2* 12/27/2011   HGB 11.0* 12/27/2011   HCT 31.7* 12/27/2011   MCV 100.9* 12/27/2011   PLT 140 12/27/2011   NEUTROABS 2.5 12/27/2011     Chemistry      Component Value Date/Time   NA 136 11/22/2011 0954   K 4.2 11/22/2011 0954   CL 102 11/22/2011 0954   CO2 24 11/22/2011 0954   BUN 25* 11/22/2011 0954   CREATININE 1.12  11/22/2011 0954      Component Value Date/Time   CALCIUM 9.1 11/22/2011 0954   ALKPHOS 43 11/22/2011 0954   AST 37 11/22/2011 0954   ALT 29 11/22/2011 0954   BILITOT 0.9 11/22/2011 0954        Radiological Studies:  No new results found.   Assessment:  78 year-old with kappa light chain multiple myeloma  (1) presenting April 2007 with anemia and renal failure; renal Bx showing free kappa light chain deposition; bone marrow biopsy showing 37% plasma cells, with normal cytogenetics and FISH;bone survey showing skull lytic lesions; treated with  (2) thalidomide (200 mg/d) and dexamethasone (40 mg/d x4d Q28d) June through Nov 2007, with good response, but poor tolerance;  (3) thalidomide decreased to 100 mg/d, dexamethasone continued, bortezomib (IV) added, Dec 2007 to March 2008  (4) treatment interrupted by multiple complications (peripheral neuropathy, pulmonary embolism, diverticular abscess, CN V zoster, severe DDD, congestive heart failure, rising PSA); maintenance zolendronic acid through March 2012  (5) progression April 2012, treated initially with dexamethasone alone, poorly tolerated;  (6) bortezomib (sq) resumed July 2012; cyclophosphamide and dexamethasone added Sept 2012; zolendronic acid changed to Q 3months (7) aranesp started December  2012   Plan:  Verlon Au going to continue the current treatment plan at this month, March, and April. On April 10 I will see him again in before that visit he will generally have had additional lab monthly but a bone survey. I am hoping to be able to stop the Velcade and Cytoxan at that time and initiate observation. Again I encouraged them to call us if he develops fever or severe shaking chills. He has all his appointments and orders through April 10 in place.  MAGRINAT,GUSTAV C, PA-C 12/27/2011

## 2011-12-28 ENCOUNTER — Ambulatory Visit: Payer: Medicare Other | Admitting: Oncology

## 2011-12-28 ENCOUNTER — Other Ambulatory Visit: Payer: Medicare Other | Admitting: Lab

## 2011-12-28 LAB — COMPREHENSIVE METABOLIC PANEL
Alkaline Phosphatase: 59 U/L (ref 39–117)
Glucose, Bld: 96 mg/dL (ref 70–99)
Sodium: 137 mEq/L (ref 135–145)
Total Bilirubin: 0.8 mg/dL (ref 0.3–1.2)
Total Protein: 5.6 g/dL — ABNORMAL LOW (ref 6.0–8.3)

## 2011-12-28 LAB — KAPPA/LAMBDA LIGHT CHAINS
Kappa:Lambda Ratio: 21.18 — ABNORMAL HIGH (ref 0.26–1.65)
Lambda Free Lght Chn: 1.1 mg/dL (ref 0.57–2.63)

## 2012-01-03 ENCOUNTER — Other Ambulatory Visit: Payer: Self-pay | Admitting: Oncology

## 2012-01-03 ENCOUNTER — Ambulatory Visit (HOSPITAL_BASED_OUTPATIENT_CLINIC_OR_DEPARTMENT_OTHER): Payer: Medicare Other

## 2012-01-03 ENCOUNTER — Other Ambulatory Visit (HOSPITAL_BASED_OUTPATIENT_CLINIC_OR_DEPARTMENT_OTHER): Payer: Medicare Other

## 2012-01-03 DIAGNOSIS — C9000 Multiple myeloma not having achieved remission: Secondary | ICD-10-CM

## 2012-01-03 DIAGNOSIS — Z5112 Encounter for antineoplastic immunotherapy: Secondary | ICD-10-CM

## 2012-01-03 LAB — CBC WITH DIFFERENTIAL/PLATELET
BASO%: 0.2 % (ref 0.0–2.0)
EOS%: 1.7 % (ref 0.0–7.0)
HCT: 28.3 % — ABNORMAL LOW (ref 38.4–49.9)
MCH: 33.4 pg (ref 27.2–33.4)
MCHC: 35.3 g/dL (ref 32.0–36.0)
MONO#: 0.6 10*3/uL (ref 0.1–0.9)
NEUT%: 64.8 % (ref 39.0–75.0)
RBC: 2.99 10*6/uL — ABNORMAL LOW (ref 4.20–5.82)
WBC: 4.2 10*3/uL (ref 4.0–10.3)
lymph#: 0.8 10*3/uL — ABNORMAL LOW (ref 0.9–3.3)

## 2012-01-03 MED ORDER — BORTEZOMIB CHEMO SQ INJECTION 3.5 MG (2.5MG/ML)
1.5000 mg/m2 | Freq: Once | INTRAMUSCULAR | Status: AC
  Administered 2012-01-03: 3 mg via SUBCUTANEOUS
  Filled 2012-01-03: qty 3

## 2012-01-03 MED ORDER — ONDANSETRON HCL 8 MG PO TABS
8.0000 mg | ORAL_TABLET | Freq: Once | ORAL | Status: AC
  Administered 2012-01-03: 8 mg via ORAL

## 2012-01-10 ENCOUNTER — Encounter (HOSPITAL_COMMUNITY): Payer: Self-pay | Admitting: *Deleted

## 2012-01-10 ENCOUNTER — Emergency Department (HOSPITAL_COMMUNITY): Payer: Medicare Other

## 2012-01-10 ENCOUNTER — Inpatient Hospital Stay (HOSPITAL_COMMUNITY)
Admission: EM | Admit: 2012-01-10 | Discharge: 2012-01-15 | DRG: 580 | Disposition: A | Discharge status: Home Health | Payer: Medicare Other | Attending: Family Medicine | Admitting: Family Medicine

## 2012-01-10 DIAGNOSIS — L97529 Non-pressure chronic ulcer of other part of left foot with unspecified severity: Secondary | ICD-10-CM

## 2012-01-10 DIAGNOSIS — I255 Ischemic cardiomyopathy: Secondary | ICD-10-CM

## 2012-01-10 DIAGNOSIS — I2589 Other forms of chronic ischemic heart disease: Secondary | ICD-10-CM | POA: Diagnosis present

## 2012-01-10 DIAGNOSIS — L02619 Cutaneous abscess of unspecified foot: Principal | ICD-10-CM | POA: Diagnosis present

## 2012-01-10 DIAGNOSIS — I10 Essential (primary) hypertension: Secondary | ICD-10-CM

## 2012-01-10 DIAGNOSIS — N289 Disorder of kidney and ureter, unspecified: Secondary | ICD-10-CM | POA: Diagnosis present

## 2012-01-10 DIAGNOSIS — C9000 Multiple myeloma not having achieved remission: Secondary | ICD-10-CM | POA: Diagnosis present

## 2012-01-10 DIAGNOSIS — M549 Dorsalgia, unspecified: Secondary | ICD-10-CM | POA: Diagnosis present

## 2012-01-10 DIAGNOSIS — I509 Heart failure, unspecified: Secondary | ICD-10-CM | POA: Diagnosis present

## 2012-01-10 DIAGNOSIS — L03119 Cellulitis of unspecified part of limb: Principal | ICD-10-CM | POA: Diagnosis present

## 2012-01-10 DIAGNOSIS — G589 Mononeuropathy, unspecified: Secondary | ICD-10-CM | POA: Diagnosis present

## 2012-01-10 DIAGNOSIS — E871 Hypo-osmolality and hyponatremia: Secondary | ICD-10-CM | POA: Diagnosis present

## 2012-01-10 DIAGNOSIS — I5022 Chronic systolic (congestive) heart failure: Secondary | ICD-10-CM | POA: Diagnosis present

## 2012-01-10 LAB — DIFFERENTIAL
Basophils Absolute: 0 10*3/uL (ref 0.0–0.1)
Eosinophils Absolute: 0 10*3/uL (ref 0.0–0.7)
Eosinophils Relative: 0 % (ref 0–5)
Monocytes Relative: 5 % (ref 3–12)
Neutrophils Relative %: 90 % — ABNORMAL HIGH (ref 43–77)

## 2012-01-10 LAB — POCT I-STAT, CHEM 8
BUN: 38 mg/dL — ABNORMAL HIGH (ref 6–23)
Calcium, Ion: 1.22 mmol/L (ref 1.12–1.32)
Chloride: 100 mEq/L (ref 96–112)
Creatinine, Ser: 1.4 mg/dL — ABNORMAL HIGH (ref 0.50–1.35)

## 2012-01-10 LAB — CBC
Hemoglobin: 9.1 g/dL — ABNORMAL LOW (ref 13.0–17.0)
MCH: 33.6 pg (ref 26.0–34.0)
MCHC: 35.9 g/dL (ref 30.0–36.0)
MCHC: 35.4 g/dL (ref 30.0–36.0)
Platelets: 125 10*3/uL — ABNORMAL LOW (ref 150–400)
Platelets: 124 10*3/uL — ABNORMAL LOW (ref 150–400)

## 2012-01-10 LAB — CREATININE, SERUM
Creatinine, Ser: 1.13 mg/dL (ref 0.50–1.35)
GFR calc non Af Amer: 59 mL/min — ABNORMAL LOW (ref >90)

## 2012-01-10 LAB — MAGNESIUM: Magnesium: 1.4 mg/dL — ABNORMAL LOW (ref 1.5–2.5)

## 2012-01-10 MED ORDER — HEPARIN SODIUM (PORCINE) 5000 UNIT/ML IJ SOLN
5000.0000 [IU] | Freq: Three times a day (TID) | INTRAMUSCULAR | Status: DC
  Administered 2012-01-11 – 2012-01-15 (×14): 5000 [IU] via SUBCUTANEOUS
  Filled 2012-01-10 (×19): qty 1

## 2012-01-10 MED ORDER — SODIUM CHLORIDE 0.9 % IJ SOLN
3.0000 mL | Freq: Two times a day (BID) | INTRAMUSCULAR | Status: DC
  Administered 2012-01-11 – 2012-01-15 (×4): 3 mL via INTRAVENOUS

## 2012-01-10 MED ORDER — ACETAMINOPHEN 325 MG PO TABS
650.0000 mg | ORAL_TABLET | Freq: Four times a day (QID) | ORAL | Status: DC | PRN
  Administered 2012-01-11 – 2012-01-15 (×2): 650 mg via ORAL
  Filled 2012-01-10 (×2): qty 2

## 2012-01-10 MED ORDER — THIAMINE HCL 100 MG/ML IJ SOLN
100.0000 mg | Freq: Once | INTRAMUSCULAR | Status: AC
  Administered 2012-01-10: 100 mg via INTRAVENOUS
  Filled 2012-01-10: qty 2

## 2012-01-10 MED ORDER — GABAPENTIN 300 MG PO CAPS: 900.0000 mg | ORAL_CAPSULE | Freq: Two times a day (BID) | ORAL | Status: DC

## 2012-01-10 MED ORDER — GABAPENTIN 300 MG PO CAPS
900.0000 mg | ORAL_CAPSULE | Freq: Every day | ORAL | Status: DC
  Administered 2012-01-11 – 2012-01-15 (×5): 900 mg via ORAL
  Filled 2012-01-10 (×5): qty 3

## 2012-01-10 MED ORDER — ACETAMINOPHEN 650 MG RE SUPP: 650.0000 mg | Freq: Four times a day (QID) | RECTAL | Status: DC | PRN

## 2012-01-10 MED ORDER — ASPIRIN EC 81 MG PO TBEC
81.0000 mg | DELAYED_RELEASE_TABLET | ORAL | Status: DC
  Administered 2012-01-12 – 2012-01-15 (×2): 81 mg via ORAL
  Filled 2012-01-10 (×2): qty 1

## 2012-01-10 MED ORDER — HYDROCODONE-ACETAMINOPHEN 5-325 MG PO TABS
1.0000 | ORAL_TABLET | ORAL | Status: DC | PRN
  Administered 2012-01-10: 2 via ORAL
  Filled 2012-01-10: qty 2

## 2012-01-10 MED ORDER — VALACYCLOVIR HCL 500 MG PO TABS
500.0000 mg | ORAL_TABLET | Freq: Every day | ORAL | Status: DC
  Administered 2012-01-11 – 2012-01-15 (×5): 500 mg via ORAL
  Filled 2012-01-10 (×7): qty 1

## 2012-01-10 MED ORDER — ASPIRIN 81 MG PO TABS: 81.0000 mg | ORAL_TABLET | ORAL | Status: DC

## 2012-01-10 MED ORDER — ONDANSETRON HCL 4 MG/2ML IJ SOLN: 4.0000 mg | Freq: Four times a day (QID) | INTRAMUSCULAR | Status: DC | PRN

## 2012-01-10 MED ORDER — PIPERACILLIN-TAZOBACTAM 3.375 G IVPB
3.3750 g | INTRAVENOUS | Status: AC
  Administered 2012-01-10: 3.375 g via INTRAVENOUS
  Filled 2012-01-10: qty 50

## 2012-01-10 MED ORDER — CARVEDILOL 3.125 MG PO TABS
3.1250 mg | ORAL_TABLET | Freq: Two times a day (BID) | ORAL | Status: DC
  Administered 2012-01-11 – 2012-01-15 (×9): 3.125 mg via ORAL
  Filled 2012-01-10 (×10): qty 1

## 2012-01-10 MED ORDER — GABAPENTIN 400 MG PO CAPS
1200.0000 mg | ORAL_CAPSULE | Freq: Every day | ORAL | Status: DC
  Administered 2012-01-11 – 2012-01-14 (×4): 1200 mg via ORAL
  Filled 2012-01-10 (×8): qty 3

## 2012-01-10 MED ORDER — ENSURE IMMUNE HEALTH PO LIQD
237.0000 mL | Freq: Three times a day (TID) | ORAL | Status: DC
  Administered 2012-01-11 – 2012-01-14 (×4): 237 mL via ORAL
  Filled 2012-01-10 (×6): qty 237

## 2012-01-10 MED ORDER — SIMVASTATIN 40 MG PO TABS
40.0000 mg | ORAL_TABLET | Freq: Every day | ORAL | Status: DC
  Administered 2012-01-11 – 2012-01-15 (×5): 40 mg via ORAL
  Filled 2012-01-10 (×7): qty 1

## 2012-01-10 MED ORDER — SODIUM CHLORIDE 0.9 % IV SOLN: 250.0000 mL | INTRAVENOUS | Status: DC | PRN

## 2012-01-10 MED ORDER — ONDANSETRON HCL 4 MG PO TABS: 4.0000 mg | ORAL_TABLET | Freq: Four times a day (QID) | ORAL | Status: DC | PRN

## 2012-01-10 MED ORDER — ALLOPURINOL 150 MG HALF TABLET
150.0000 mg | ORAL_TABLET | Freq: Every day | ORAL | Status: DC
  Administered 2012-01-11 – 2012-01-15 (×5): 150 mg via ORAL
  Filled 2012-01-10 (×7): qty 1

## 2012-01-10 MED ORDER — SODIUM CHLORIDE 0.9 % IJ SOLN: 3.0000 mL | INTRAMUSCULAR | Status: DC | PRN

## 2012-01-10 MED ORDER — PIPERACILLIN-TAZOBACTAM 3.375 G IVPB
3.3750 g | Freq: Three times a day (TID) | INTRAVENOUS | Status: DC
  Administered 2012-01-10 – 2012-01-15 (×14): 3.375 g via INTRAVENOUS
  Filled 2012-01-10 (×17): qty 50

## 2012-01-10 MED ORDER — SODIUM CHLORIDE 0.9 % IV SOLN
750.0000 mg | Freq: Two times a day (BID) | INTRAVENOUS | Status: DC
  Administered 2012-01-11 – 2012-01-15 (×9): 750 mg via INTRAVENOUS
  Filled 2012-01-10 (×13): qty 750

## 2012-01-10 MED ORDER — SODIUM CHLORIDE 0.9 % IV BOLUS (SEPSIS)
1000.0000 mL | Freq: Once | INTRAVENOUS | Status: AC
  Administered 2012-01-10: 1000 mL via INTRAVENOUS

## 2012-01-10 MED ORDER — VANCOMYCIN HCL IN DEXTROSE 1-5 GM/200ML-% IV SOLN
1000.0000 mg | INTRAVENOUS | Status: AC
  Administered 2012-01-10: 1000 mg via INTRAVENOUS
  Filled 2012-01-10: qty 200

## 2012-01-10 NOTE — H&P (Addendum)
Hospital Admission Note Date: 01/10/2012  PCP: [name], MD, MD  Chief Complaint: Left foot swelling and redness.   History of Present Illness: 78 year old with PMH MM on chemotherapy Velcade, Cytoxan and Dexamethasone Saturday and Sunday, Ischemic cardiomyopathy EF 30 % presents to the ED complaining of worsening redness and swelling of left foot, started 1 week ago. He relate that he had a callus 1 month ago then progress to open wound. He relates increase drainage blood tinge material. He also relates weakness, fatigue, and decrease appetite. He is having some diarrhea, loose stool. He usually have diarrhea after chemotherapy.    Allergies: Review of patient's allergies indicates no known allergies. Past Medical History  Diagnosis Date  . ASCVD (arteriosclerotic cardiovascular disease)   . Anemia     chronic mild anemia  . Gout   . Hypertension   . Coronary artery disease     CABG 1991  . Lumbar disc disease     post laminectomy  . Chronic back pain   . HX: long term anticoagulant use   . History of echocardiogram 11/02/2010    EF range of 30 to 35% / There is hypokinsesis of the basal-mild inferolateral myocardium  . CHF (congestive heart failure)   . Heart murmur   . Ischemic cardiomyopathy   . Renal insufficiency   . Peripheral neuropathy   . Inferior myocardial infarction 1986  . Hyperlipidemia   . GERD (gastroesophageal reflux disease)   . SOB (shortness of breath)   . Chest pain   . Fatigue   . Myeloma     Multiple myeloma, with recurrence of increasing problems  . Cancer   . Peripheral neuropathy    Prior to Admission medications   Medication Sig Start Date End Date Taking? Authorizing Provider  acetaminophen (TYLENOL) 500 MG tablet Take 500 mg by mouth as needed.     Yes Historical Provider, MD  allopurinol (ZYLOPRIM) 300 MG tablet Take 150 mg by mouth daily. PT TAKES 1/2 TAB FOR 150 MG DOSE   Yes Historical Provider, MD  aspirin 81 MG tablet Take 81  mg by mouth every Monday, Wednesday, and Friday.    Yes Historical Provider, MD  atorvastatin (LIPITOR) 40 MG tablet Take 20 mg by mouth daily. PT TAKES 1/2 TAB FOR A 20 MG DOSE 08/08/11  Yes Peter [place], MD  B Complex-Biotin-FA (B COMPLETE PO) Take 0.5 tablets by mouth 2 (two) times daily.    Yes Historical Provider, MD  carvedilol (COREG) 3.125 MG tablet Take 1 tablet (3.125 mg total) by mouth 2 (two) times daily with a meal. 08/08/11  Yes Peter [place], MD  Coenzyme Q10 (CO Q-10 PO) Take 1 tablet by mouth daily.     Yes Historical Provider, MD  cyclophosphamide (CYTOXAN) 50 MG tablet Take 300 mg by mouth See admin instructions. Give on an empty stomach 1 hour before or 2 hours after meals. PT Takes 6 tablets on Saturday. 6 tablets on Sunday for a full dose of 600 mg total   Yes Historical Provider, MD  gabapentin (NEURONTIN) 300 MG capsule Take 900-1,200 mg by mouth 2 (two) times daily. PT TAKES 3 CAPSULES IN THE MORNING FOR 900 MG DOSE. 4 CAPS AT BEDTIME FOR 1200 MG DOSE.   Yes Historical Provider, MD  hydrochlorothiazide (HYDRODIURIL) 25 MG tablet Take 50 mg by mouth daily.  10/25/11  Yes Historical Provider, MD  HYDROmorphone (DILAUDID) 4 MG tablet Take 1 tablet by mouth as needed.  04/10/11  Yes Historical Provider, MD  lisinopril (PRINIVIL,ZESTRIL) 20 MG tablet Take 1 tablet (20 mg total) by mouth daily. 11/08/11  Yes Peter [place], MD  multivitamin [facility]) per tablet Take 0.5 tablets by mouth 2 (two) times daily.    Yes Historical Provider, MD  Omega-3 Fatty Acids (FISH OIL PO) Take 1 capsule by mouth 2 (two) times daily.     Yes Historical Provider, MD  triamterene-hydrochlorothiazide (DYAZIDE) 37.5-25 MG per capsule Take 1 tablet by mouth Daily. Take 1 and 1/2 daily 03/24/11  Yes Historical Provider, MD  valACYclovir (VALTREX) 500 MG tablet Take 500 mg by mouth daily. CONT.   Yes Historical Provider, MD  bortezomib SQ (VELCADE) 3.5 MG SOLR Inject 1.3 mg/m2 into the skin once. As directed. PT  [facility]JE[facility] 01-03-12. PT GETS THIS 2 WEEKS ON,2 WEEKS OFF. FOLLOWED BY DR MARGINATE.    next injection's due 01-24-12    Historical Provider, MD  dexamethasone (DECADRON) 4 MG tablet Take 12 mg by mouth See admin instructions. Take after chemo pt takes 3 tabs on Saturday. 3 tabs on Sunday.    Historical Provider, MD   Past Surgical History  Procedure Date  . Laminectomy   . Inguinal hernia repair   . Cardiac catheterization 06/20/2000    Severe coronary disease (totally occluded right artery, 90% left circumflex, 50-60% intermediate, and 90-95% ostial left anterior descending)   . Tonsillectomy   . Coronary artery bypass graft 06/22/2000    x4 / Left internal mammary artery to the LAD.  / Saphenous vein graft to the right posterolateral.  /  Saphenous vein graft to  the ramus intermedius. / Saphenous vein graft to the circumflex marginal      No family history on file. History   Social History  . Marital Status: Married    Spouse Name: N/A    Number of Children: N/A  . Years of Education: N/A   Occupational History  . Not on file.   Social History Main Topics  . Smoking status: Former Smoker -- 1.0 packs/day for 20 years    Types: Cigarettes    Quit date: 11/20/1984  . Smokeless tobacco: Never Used  . Alcohol Use: No  . Drug Use: No  . Sexually Active: Yes      REVIEW OF SYSTEMS:  Constitutional:  No weight loss, night sweats, Fevers, chills, fatigue.  HEENT:  No headaches, Difficulty swallowing,Tooth/dental problems,Sore throat,  No sneezing, itching, ear ache, nasal congestion, post nasal drip,  Cardio-vascular:  No chest pain, Orthopnea, PND, swelling in lower extremities, anasarca, dizziness, palpitations  GI:  No heartburn, indigestion, abdominal pain, nausea, vomiting, , change in bowel habits, loss of appetite  Resp:  No shortness of breath with exertion or at rest. No excess mucus, no productive cough, No non-productive cough, No  coughing up of blood.No change in color of mucus.No wheezing.No chest wall deformity  Skin:  no rash or lesions.  GU:  no dysuria, change in color of urine, no urgency or frequency. No flank pain.  Musculoskeletal:  No joint pain or swelling. No decreased range of motion. No back pain.    Physical Exam: Filed Vitals:   01/10/12 1216 01/10/12 1533  BP: 97/70 116/59  Pulse: 85 76  Temp: 97.8 F (36.6 C)   TempSrc: Oral   Resp: 18 16  Height: 5\' 10"  (1.778 m)   Weight: 82.555 kg (182 lb)   SpO2: 100% 96%   No intake or output data  in the 24 hours ending 01/10/12 1559 BP 116/59  Pulse 76  Temp(Src) 97.8 F (36.6 C) (Oral)  Resp 16  Ht 5\' 10"  (1.778 m)  Wt 82.555 kg (182 lb)  BMI 26.11 kg/m2  SpO2 96%  General Appearance:    Alert, cooperative, no distress, appears stated age  Head:    Normocephalic, without obvious abnormality, atraumatic  Eyes:    PERRL, conjunctiva/corneas clear, EOM's intact,        Ears:    Normal TM's and external ear canals, both ears  Nose:   Nares normal, septum midline, mucosa normal, no drainage    or sinus tenderness  Throat:   Lips, mucosa, and tongue normal; teeth and gums normal  Neck:   Supple, symmetrical, trachea midline, no adenopathy;       thyroid:  No enlargement/tenderness/nodules; no carotid   bruit or JVD     Lungs:     Clear to auscultation bilaterally, respirations unlabored  Chest wall:    No tenderness or deformity  Heart:    Regular rate and rhythm, S1 and S2 normal, no murmur, rub   or gallop  Abdomen:     Soft, non-tender, bowel sounds active all four quadrants,    no masses, no organomegaly        Extremities:  Right  Extremities normal, atraumatic, no cyanosis or edema. Left foot with swelling redness up to calf, plantar aspect with redness, internal aspect foot ulceration, round, coin side. No drainage, no fluctuation foot.   Pulses:   2+ and symmetric all extremities  Skin:   Skin color, texture, turgor normal, no  rashes or lesions  Lymph nodes:   Cervical, supraclavicular, and axillary nodes normal  Neurologic:   CNII-XII intact. Normal strength, sensation and reflexes      throughout   Lab results:  Basename 01/10/12 1410  NA 131*  K 3.7  CL 100  CO2 --  GLUCOSE 100*  BUN 38*  CREATININE 1.40*  CALCIUM --  MG --  PHOS --     Basename 01/10/12 1410 01/10/12 1400  WBC -- 9.8  NEUTROABS -- 8.8*  HGB 8.8* 9.3*  HCT 26.0* 25.9*  MCV -- 93.5  PLT -- 125*   Imaging results:  Dg Foot Complete Left  01/10/2012  *RADIOLOGY REPORT*  Clinical Data: Left foot pain all over.  The skin ulcer.  LEFT FOOT - COMPLETE 3+ VIEW  Comparison: No priors.  Findings: AP, lateral and oblique views of the left foot to demonstrate a soft tissue irregularity underneath the first metatarsophalangeal joint, that may correspond with the reported skin ulcer.  No definite underlying bony lysis to suggest the presence of osteomyelitis.  No acute displaced fracture, subluxation or dislocation.  IMPRESSION: 1.  No acute bony abnormalities noted.  Original Report Authenticated By: Florencia Reasons, M.D.      Patient Active Hospital Problem List:  Left foot cellulitis, Ulcer: Will admit patient for IV antibiotics. I will start Vancomycin to cover for MRSA and Zosyn to cover for Gram negative.  X ray was negative for osteomyelitis. I will check CT rule out osteo. Wound care consult. If swelling doesnt decrease consider ortho consult.   Ischemic cardiomyopathy (04/21/2011)  Patient denies dyspnea. I will continue with aspirin and coreg. Hold Dyazide due to Soft SBP.   Multiple myeloma (04/21/2011) Patient on decadron and Cytoxan Saturday and Sundays, will likely hold this medication due to infection. I inform Dr Darnelle Catalan patient is in the hospital.  Hypertension (04/21/2011) SBP soft on admission. I will hold HCTZ, Dyazide, and lisinopril. This medication will need to be re address.   Renal insufficiency (04/21/2011)   Last Cr was 1.3 BUN 25 on 12-27-2011. Mild increase BUN. I will hold diuretic and lisinopril. Patient relates diarrhea. I will avoid IV fluids due to history of Ischemic Cardiomyopathy EF 30. B-met in am. Patient received 1 L NS in the ED.   Systolic CHF, chronic (11/08/2011) Appears compensated.  Hyponatremia; mild. Hold diuretic. Patient received 1 L IV fluids.  in the ED.  DVT prophylasix: Heparin. Monitor platelet level.     Ailana Cuadrado M.D. [facility] [phone] 01/10/2012, 3:59 PM

## 2012-01-10 NOTE — ED Provider Notes (Signed)
History     CSN: [id number]  Arrival date & time 01/10/12  1204   First MD Initiated Contact with Patient 01/10/12 1230      Chief Complaint  Patient presents with  . Skin Ulcer  . Wound Check    (Consider location/radiation/quality/duration/timing/severity/associated sxs/prior treatment) HPI Complains of draining open wound to left foot medial aspect onset several weeks ago becoming worse draining blood-tinged material over the past 4 days. Also complains of generalized weakness and decreased appetite. Seen at urgent care Center this morning sent here for further evaluation. No treatment prior to coming here. Symptoms progressively worsening no pain. No other associated symptoms no nausea vomiting no shortness of breath . Past Medical History  Diagnosis Date  . ASCVD (arteriosclerotic cardiovascular disease)   . Anemia     chronic mild anemia  . Gout   . Hypertension   . Coronary artery disease     CABG 1991  . Lumbar disc disease     post laminectomy  . Chronic back pain   . HX: long term anticoagulant use   . History of echocardiogram 11/02/2010    EF range of 30 to 35% / There is hypokinsesis of the basal-mild inferolateral myocardium  . CHF (congestive heart failure)   . Heart murmur   . Ischemic cardiomyopathy   . Renal insufficiency   . Peripheral neuropathy   . Inferior myocardial infarction 1986  . Hyperlipidemia   . GERD (gastroesophageal reflux disease)   . SOB (shortness of breath)   . Chest pain   . Fatigue   . Myeloma     Multiple myeloma, with recurrence of increasing problems  . Cancer   . Peripheral neuropathy     Past Surgical History  Procedure Date  . Laminectomy   . Inguinal hernia repair   . Cardiac catheterization 06/20/2000    Severe coronary disease (totally occluded right artery, 90% left circumflex, 50-60% intermediate, and 90-95% ostial left anterior descending)   . Tonsillectomy   . Coronary artery bypass graft 06/22/2000    x4 /  Left internal mammary artery to the LAD.  / Saphenous vein graft to the right posterolateral.  /  Saphenous vein graft to  the ramus intermedius. / Saphenous vein graft to the circumflex marginal       No family history on file.  History  Substance Use Topics  . Smoking status: Former Smoker -- 1.0 packs/day for 20 years    Types: Cigarettes    Quit date: 11/20/1984  . Smokeless tobacco: Never Used  . Alcohol Use: No      Review of Systems  Constitutional: Positive for appetite change and fatigue.  HENT: Negative.   Respiratory: Negative.   Cardiovascular: Negative.   Gastrointestinal: Negative.   Musculoskeletal: Negative.   Skin: Positive for wound.  Neurological: Negative.   Hematological: Negative.   Psychiatric/Behavioral: Negative.     Allergies  Review of patient's allergies indicates no known allergies.  Home Medications   Current Outpatient Rx  Name Route Sig Dispense Refill  . ACETAMINOPHEN 500 MG PO TABS Oral Take 500 mg by mouth as needed.      . ASPIRIN 81 MG PO TABS Oral Take 81 mg by mouth daily.      . ATORVASTATIN CALCIUM 40 MG PO TABS Oral Take 1 tablet (40 mg total) by mouth daily. 90 tablet 3  . B COMPLETE PO Oral Take 1 tablet by mouth daily.      Marland Kitchen BORTEZOMIB CHEMO  SQ INJECTION 3.5 MG Subcutaneous Inject 1.3 mg/m2 into the skin once. As directed.     Marland Kitchen CARVEDILOL 3.125 MG PO TABS Oral Take 1 tablet (3.125 mg total) by mouth 2 (two) times daily with a meal. 180 tablet 3  . CO Q-10 PO Oral Take 1 tablet by mouth daily.      Marland Kitchen DEXAMETHASONE 4 MG PO TABS Oral Take 4 mg by mouth 2 (two) times daily with a meal. Take after chemo     . HYDROCHLOROTHIAZIDE 25 MG PO TABS Oral Take 25 mg by mouth Twice daily.    Marland Kitchen HYDROMORPHONE HCL 4 MG PO TABS Oral Take 1 tablet by mouth as needed.     Marland Kitchen LISINOPRIL 20 MG PO TABS Oral Take 1 tablet (20 mg total) by mouth daily. 90 tablet 3  . MULTIVITAMINS PO TABS Oral Take 1 tablet by mouth daily.      Marland Kitchen FISH OIL PO Oral  Take 1 capsule by mouth 2 (two) times daily.      . TRIAMTERENE-HCTZ 37.5-25 MG PO CAPS Oral Take 1 tablet by mouth Daily. Take 1 and 1/2 daily    . VALACYCLOVIR HCL 500 MG PO TABS Oral Take 500 mg by mouth daily.        BP 97/70  Pulse 85  Temp(Src) 97.8 F (36.6 C) (Oral)  Resp 18  Ht 5\' 10"  (1.778 m)  Wt 182 lb (82.555 kg)  BMI 26.11 kg/m2  SpO2 100%  Physical Exam  Nursing note and vitals reviewed. Constitutional: He appears well-developed and well-nourished. No distress.  HENT:  Head: Normocephalic and atraumatic.  Eyes: Conjunctivae are normal. Pupils are equal, round, and reactive to light.  Neck: Neck supple. No tracheal deviation present. No thyromegaly present.  Cardiovascular: Normal rate and regular rhythm.   No murmur heard. Pulmonary/Chest: Effort normal and breath sounds normal.  Abdominal: Soft. Bowel sounds are normal. He exhibits no distension. There is no tenderness.  Musculoskeletal: Normal range of motion. He exhibits no edema and no tenderness.  Neurological: He is alert. Coordination normal.  Skin: Skin is warm and dry. No rash noted.       Left lower extremity with silver dollar sized open wound at medial aspect of foot at the overlying distal first metatarsal Dorsum of foot and distal one third of legs are red and warm No inguinal nodes  Psychiatric: He has a normal mood and affect.    ED Course  Procedures (including critical care time)  Labs Reviewed - No data to display No results found.   No diagnosis found.  3:25 PM patient alert feels the same after treatment with intravenous antibiotics and intravenous fluids I and. . Not acutely ill-appearing. Spoke with Dr.Regaldo, who will arrange for admission.  MDM  Plan admit intravenous antibiotics Suspect possible osteomyelitis underlying the wound Diagnosis #1 foot ulcer #2Cellulitis  #3 thrombocytopenia #4 anemia #5 hypotension        Doug Sou, MD 01/10/12 1535

## 2012-01-10 NOTE — ED Notes (Signed)
Wound to left foot cleaned with NS and dry dressing applied.

## 2012-01-10 NOTE — ED Notes (Signed)
Patient transported to X-ray 

## 2012-01-10 NOTE — ED Notes (Signed)
Water given to patient 

## 2012-01-10 NOTE — ED Notes (Signed)
Pt returned from X-ray.  

## 2012-01-10 NOTE — ED Notes (Signed)
Pts wife reports pt was seen at [facility] today for foot ulcer. Was dressed there and sent here for eval. Started as a callus that pt filed down and never completely healed. Worse over last 3-4 days. Draining serosanginous fluid. Hx of multiple myeloma, neuropathy. Last chemo IV last Wed and cytoxin at home this weekend.

## 2012-01-10 NOTE — Progress Notes (Addendum)
ANTIBIOTIC CONSULT NOTE - INITIAL  Pharmacy Consult for Vancomycin/Zosyn Indication: Left foot cellulitis/ulcer  No Known Allergies  Patient Measurements: Height: 5\' 10"  (177.8 cm) Weight: 182 lb (82.555 kg) IBW/kg (Calculated) : 73   Vital Signs: Temp: 98.7 F (37.1 C) (02/20 1955) Temp src: Oral (02/20 1955) BP: 137/71 mmHg (02/20 1955) Pulse Rate: 79  (02/20 1955)  Labs:  Basename 01/10/12 1410 01/10/12 1400  WBC -- 9.8  HGB 8.8* 9.3*  PLT -- 125*  LABCREA -- --  CREATININE 1.40* --   Estimated Creatinine Clearance: 42.7 ml/min (by C-G formula based on Cr of 1.4).  Microbiology: No results found for this or any previous visit (from the past 720 hour(s)).  Medical History: Past Medical History  Diagnosis Date  . ASCVD (arteriosclerotic cardiovascular disease)   . Anemia     chronic mild anemia  . Gout   . Hypertension   . Coronary artery disease     CABG 1991  . Lumbar disc disease     post laminectomy  . Chronic back pain   . HX: long term anticoagulant use   . History of echocardiogram 11/02/2010    EF range of 30 to 35% / There is hypokinsesis of the basal-mild inferolateral myocardium  . CHF (congestive heart failure)   . Heart murmur   . Ischemic cardiomyopathy   . Renal insufficiency   . Peripheral neuropathy   . Inferior myocardial infarction 1986  . Hyperlipidemia   . GERD (gastroesophageal reflux disease)   . SOB (shortness of breath)   . Chest pain   . Fatigue   . Myeloma     Multiple myeloma, with recurrence of increasing problems  . Cancer   . Peripheral neuropathy    Assessment: 83YOM s/p chemo presenting with complaining of worsening redness and swelling of left foot to begin empiric treatment with Vancomycin and Zosyn. Patient received 1g Vanc and 3.375g Zosyn doses x1 in the ED already.  No acute bony abnormalities noted on Xray, but plan to check CT to rule out osteo.  CrCl~42 mL/min with SCr elevated from patient's  baseline.  Goal of Therapy:  Vancomycin trough level 15-20 until osteo is ruled out  Plan:  Vancomycin 750mg  IV q12h. Zosyn 3.375g IV q8h (4 hour infusion time). Follow up SCr and obtain trough at steady state.  Clance Boll, PharmD Pager: [phone] 01/10/2012 8:56 PM

## 2012-01-10 NOTE — ED Notes (Signed)
Called Pharmacy to check on status of ensure as it was never sent up. Sts they will tube it now.

## 2012-01-11 ENCOUNTER — Other Ambulatory Visit: Payer: Self-pay | Admitting: Oncology

## 2012-01-11 ENCOUNTER — Telehealth: Payer: Self-pay | Admitting: *Deleted

## 2012-01-11 ENCOUNTER — Encounter (HOSPITAL_COMMUNITY): Payer: Self-pay | Admitting: *Deleted

## 2012-01-11 DIAGNOSIS — C9000 Multiple myeloma not having achieved remission: Secondary | ICD-10-CM

## 2012-01-11 DIAGNOSIS — L97509 Non-pressure chronic ulcer of other part of unspecified foot with unspecified severity: Secondary | ICD-10-CM

## 2012-01-11 LAB — COMPREHENSIVE METABOLIC PANEL
Albumin: 2.3 g/dL — ABNORMAL LOW (ref 3.5–5.2)
Alkaline Phosphatase: 64 U/L (ref 39–117)
BUN: 29 mg/dL — ABNORMAL HIGH (ref 6–23)
Calcium: 8.7 mg/dL (ref 8.4–10.5)
Creatinine, Ser: 1.17 mg/dL (ref 0.50–1.35)
GFR calc Af Amer: 66 mL/min — ABNORMAL LOW (ref >90)
Potassium: 3.6 mEq/L (ref 3.5–5.1)
Total Protein: 5.2 g/dL — ABNORMAL LOW (ref 6.0–8.3)

## 2012-01-11 LAB — TSH: TSH: 0.837 u[IU]/mL (ref 0.350–4.500)

## 2012-01-11 LAB — CBC
HCT: 25.4 % — ABNORMAL LOW (ref 39.0–52.0)
Hemoglobin: 9.1 g/dL — ABNORMAL LOW (ref 13.0–17.0)
MCV: 94.4 fL (ref 78.0–100.0)
RBC: 2.69 MIL/uL — ABNORMAL LOW (ref 4.22–5.81)
WBC: 7.4 10*3/uL (ref 4.0–10.5)

## 2012-01-11 LAB — PROTIME-INR: INR: 1.09 (ref 0.00–1.49)

## 2012-01-11 MED ORDER — ZOLPIDEM TARTRATE 5 MG PO TABS
5.0000 mg | ORAL_TABLET | Freq: Every evening | ORAL | Status: DC | PRN
  Administered 2012-01-11 – 2012-01-14 (×4): 5 mg via ORAL
  Filled 2012-01-11 (×4): qty 1

## 2012-01-11 MED ORDER — LORAZEPAM 0.5 MG PO TABS
0.5000 mg | ORAL_TABLET | Freq: Four times a day (QID) | ORAL | Status: DC | PRN
  Administered 2012-01-11 – 2012-01-15 (×2): 0.5 mg via ORAL
  Filled 2012-01-11 (×2): qty 1

## 2012-01-11 MED ORDER — HYDROMORPHONE HCL 4 MG PO TABS
4.0000 mg | ORAL_TABLET | Freq: Four times a day (QID) | ORAL | Status: DC | PRN
  Administered 2012-01-11: 4 mg via ORAL
  Filled 2012-01-11: qty 1

## 2012-01-11 MED ORDER — COLLAGENASE 250 UNIT/GM EX OINT
TOPICAL_OINTMENT | Freq: Every day | CUTANEOUS | Status: DC
  Administered 2012-01-11 – 2012-01-14 (×3): via TOPICAL
  Administered 2012-01-15: 1 via TOPICAL
  Filled 2012-01-11: qty 30

## 2012-01-11 MED ORDER — HYDROMORPHONE HCL 2 MG PO TABS
2.0000 mg | ORAL_TABLET | Freq: Four times a day (QID) | ORAL | Status: DC | PRN
  Administered 2012-01-11: 3 mg via ORAL
  Administered 2012-01-12: 4 mg via ORAL
  Administered 2012-01-12 – 2012-01-13 (×2): 3 mg via ORAL
  Administered 2012-01-13: 2 mg via ORAL
  Administered 2012-01-14: 3 mg via ORAL
  Administered 2012-01-14: 2 mg via ORAL
  Administered 2012-01-15: 4 mg via ORAL
  Filled 2012-01-11 (×4): qty 2
  Filled 2012-01-11: qty 1
  Filled 2012-01-11 (×3): qty 2

## 2012-01-11 NOTE — Progress Notes (Signed)
Subjective: Continued l. Foot pain and swelling w/ foul smelling drainage   Objective: Vital signs in last 24 hours: Temp:  [97.6 F (36.4 C)-100.1 F (37.8 C)] 100.1 F (37.8 C) (02/21 1740) Pulse Rate:  [75-94] 93  (02/21 1423) Resp:  [12-19] 18  (02/21 1423) BP: (112-138)/(50-84) 138/70 mmHg (02/21 1423) SpO2:  [96 %-100 %] 96 % (02/21 1423)  Intake/Output from previous day: 02/20 0701 - 02/21 0700 In: 100 [P.O.:100] Out: 825 [Urine:825] Intake/Output this shift:     Basename 01/11/12 0450 01/10/12 2100 01/10/12 1410 01/10/12 1400  HGB 9.1* 9.1* 8.8* 9.3*    Basename 01/11/12 0450 01/10/12 2100  WBC 7.4 7.0  RBC 2.69* 2.76*  HCT 25.4* 25.7*  PLT 137* 124*    Basename 01/11/12 0450 01/10/12 2100 01/10/12 1410  NA 132* -- 131*  K 3.6 -- 3.7  CL 100 -- 100  CO2 24 -- --  BUN 29* -- 38*  CREATININE 1.17 1.13 --  GLUCOSE 99 -- 100*  CALCIUM 8.7 -- --    Basename 01/11/12 0450  LABPT --  INR 1.09  WDWN male NAD Eyes not dilated  absent sensation.  swelling and drainage from foot wound. surrounding erythema  Assessment/Plan: 78yo male for Excisional debridement l. Foot Op note dictated [id number] Will follow with wound care and whirlpool  Rhodia Acres L 01/11/2012, 7:33 PM

## 2012-01-11 NOTE — Consult Note (Addendum)
WOC consult Note Reason for Consult: eval wound L plantar surface foot wound. Pt reports wound started as callous about 2wks ago and he and family started treating with bacitracin but the area has worsened.  Foul odor noted as soon as I entered the room.  He has recurrent myeloma and is currently receiving chemotherapy tx for this.  I have explained that based on my assessment today he will need an orthopedic surgeon consult to eval for possible surgical debridement of this area.  Pt verbalized understanding but is very upset at the thoughts of surgical intervention.  I will begin with enzymatic debridement of this area but explained that this topical tx would not be quick and that surgical debridement would be best option.  I have contacted Dr. Arther Abbott and requested orthopedic surgery consult and he is in agreement based on my assessment to proceed with this.     Wound type: foot ulcer left plantar surface  Measurement: necrotic area 7cm x 4.0cm x 0.2cm however noted to be fluctuant in the center, suspicious that this is very deep tissue damage.  Erythema  extends upwards from wound along medial ankle area 10cm and onto the dorsal foot 6-8 cm.  Wound bed: 100% necrotic, fluctuant, black tissue with center slough  Drainage (amount, consistency, odor) heavy with significant odor  Periwound: erythema as noted above  Dressing procedure/placement/frequency: will order enzymatic debridement agent, once arrives from pharmacy will have nurse apply, cover with normal saline moist gauze dressing and wrap with kerlix, to be applied/changed daily until orthopedic surgeon evaluation can take place.  Re consult if needed, will not follow at this time. Thanks  Afshin Chrystal [employer], CWOCN [phone])      Addendum contacted Dr. Irene Limbo hospitalist on call today to request ortho consult, returned to room to to explain to family at bedside my recommendations for surgical evaluation.  They have verbalized  understanding, re dressed wound with gauze and conform dressing until Santyl arrives for bedside nursing to apply.  Jonuel Butterfield [employer], [employer]

## 2012-01-11 NOTE — Progress Notes (Signed)
Pt expressing dissatisfaction with his ability and quality of sleep the previous night.  Notified MD.  Received new orders.  Will continue to monitor pt.  Daphene Calamity [facility] 01/11/2012 07:40 PM

## 2012-01-11 NOTE — ED Notes (Signed)
Breakfast tray given. °

## 2012-01-11 NOTE — ED Notes (Signed)
Called Pharmacy and spoke with JC- unable to find meds for this pt- requested to send medications for this pt

## 2012-01-11 NOTE — Progress Notes (Signed)
PROGRESS NOTE  Timothy [name] ZOX:[id number] DOB: 08/13/1928 DOA: 01/10/2012 PCP: [name], MD, MD confirmed. Cardiologist: Peter [place]az[place], M.D.  Brief narrative: 78 year old man presented to the emergency department for a wound check. Also, generalized weakness and decreased appetite. Admitted for possible osteomyelitis and cellulitis.  Past medical history: Multiple myeloma currently undergoing chemotherapy, neuropathy, ischemic cardiomyopathy with left ejection fraction of 30%, anemia, gout, chronic back pain  Consultants:  Orthopedics  Wound care: Enzymatic debridement recommended as well as surgical consultation.  Procedures:    Antibiotics:  February 20: Vancomycin  February 20: Zosyn  Interim History: Chart reviewed in detail.  Subjective: Overall the patient feels okay.  Objective: Filed Vitals:   01/10/12 1955 01/11/12 0024 01/11/12 0449 01/11/12 1131  BP: 137/71 112/54 134/84 114/54  Pulse: 79 75 77 89  Temp: 98.7 F (37.1 C) 98.6 F (37 C) 99.1 F (37.3 C) 98.1 F (36.7 C)  TempSrc: Oral Oral Oral Oral  Resp: 18 19 16 12   Height:      Weight:      SpO2: 98% 97% 100% 96%    Intake/Output Summary (Last 24 hours) at 01/11/12 1216 Last data filed at 01/11/12 0844  Gross per 24 hour  Intake    100 ml  Output   1125 ml  Net  -1025 ml    Exam:   General:  Calm and comfortable.  Cardiovascular: Regular rate and rhythm. No murmur, rub, gallop. No lower extremity edema.  Respiratory: Clear to auscultation bilaterally. No wheezes, rales, rhonchi. Normal respiratory effort.  Skin: Wound dressed and not directly examined. Cellulitis noted extending up from the foot to just below to the knee. It appears to be regressing from previous marking. No significant edema.  Data Reviewed: Basic Metabolic Panel:  Lab 01/11/12 8119 01/10/12 2100 01/10/12 1410  NA 132* -- 131*  K 3.6 -- 3.7  CL 100 -- 100  CO2 24 -- --  GLUCOSE 99 -- 100*  BUN 29*  -- 38*  CREATININE 1.17 1.13 1.40*  CALCIUM 8.7 -- --  MG -- 1.4* --  PHOS -- -- --   Liver Function Tests:  Lab 01/11/12 0450  AST 36  ALT 35  ALKPHOS 64  BILITOT 1.0  PROT 5.2*  ALBUMIN 2.3*   CBC:  Lab 01/11/12 0450 01/10/12 2100 01/10/12 1410 01/10/12 1400  WBC 7.4 7.0 -- 9.8  NEUTROABS -- -- -- 8.8*  HGB 9.1* 9.1* 8.8* 9.3*  HCT 25.4* 25.7* 26.0* 25.9*  MCV 94.4 93.1 -- 93.5  PLT 137* 124* -- 125*   Studies: Ct Foot Left Wo Contrast  01/10/2012  *RADIOLOGY REPORT*  Clinical Data: Ulcer on left foot.  History of multiple myeloma.  CT OF THE LEFT FOOT WITHOUT CONTRAST  Technique:  Multidetector CT imaging was performed according to the standard protocol. Multiplanar CT image reconstructions were also generated.  Comparison: 01/10/2012  Findings: There is abnormal gas in the soft tissues plantar to the sesamoids, indicative of ulceration.  Gas is also present in the subcutaneous tissues along the medial ball of the foot on image 35 of series 607, with edema in the surrounding soft tissues.  No obvious drainable abscess is not observed although IV contrast was not administered.  There is atrophy of the plantar musculature of the foot.  Achilles calcaneal spur noted with some faint calcification along the plantar fascia.  No malalignment at the Lisfranc joint is noted. There is sclerosis of the medial sesamoid bone, which could be an indication  of sesamoiditis, but is nonspecific with respect to osteomyelitis.  No bony destructive findings specific for osteomyelitis are noted.  There is spurring at the interphalangeal joint great toe.  The metatarsophalangeal joints are prominently extended during imaging.  Considerable subcutaneous edema is present along the ankle, with low-level edema tracking in Kager's fat pad.  IMPRESSION:  1.  Gas in the medial soft tissues of the ball of the foot, compatible with regional ulceration.  There is considerable surrounding subcutaneous edema, and  cellulitis is suspected.  No definite drainable abscess is observed. 2.  There is sclerosis of the medial sesamoid which may indicate sesamoiditis, but which is not specific for osteomyelitis.  No bony destructive findings are observed. 3.  Nonspecific subcutaneous edema overlying the left side.  Original Report Authenticated By: Dellia Cloud, M.D.   Dg Foot Complete Left  01/10/2012  *RADIOLOGY REPORT*  Clinical Data: Left foot pain all over.  The skin ulcer.  LEFT FOOT - COMPLETE 3+ VIEW  Comparison: No priors.  Findings: AP, lateral and oblique views of the left foot to demonstrate a soft tissue irregularity underneath the first metatarsophalangeal joint, that may correspond with the reported skin ulcer.  No definite underlying bony lysis to suggest the presence of osteomyelitis.  No acute displaced fracture, subluxation or dislocation.  IMPRESSION: 1.  No acute bony abnormalities noted.  Original Report Authenticated By: Florencia Reasons, M.D.    Scheduled Meds:   . allopurinol  150 mg Oral Daily  . aspirin EC  81 mg Oral Q M,W,F  . carvedilol  3.125 mg Oral BID WC  . collagenase   Topical Daily  . feeding supplement  237 mL Oral TID WC  . gabapentin  1,200 mg Oral QHS  . gabapentin  900 mg Oral Daily  . heparin  5,000 Units Subcutaneous Q8H  . piperacillin-tazobactam (ZOSYN)  IV  3.375 g Intravenous To ER  . piperacillin-tazobactam (ZOSYN)  IV  3.375 g Intravenous Q8H  . simvastatin  40 mg Oral Daily  . sodium chloride  1,000 mL Intravenous Once  . sodium chloride  3 mL Intravenous Q12H  . thiamine  100 mg Intravenous Once  . valACYclovir  500 mg Oral Daily  . vancomycin  750 mg Intravenous Q12H  . vancomycin  1,000 mg Intravenous To ER  . DISCONTD: aspirin  81 mg Oral Q M,W,F  . DISCONTD: gabapentin  900-1,200 mg Oral BID   Continuous Infusions:    Assessment/Plan: 1. Left foot cellulitis with suspected osteomyelitis: Continue IV antibiotics. Appreciate wound care  consultation recommendations. Orthopedic consultation placed with Dr. Luiz Blare. 2. Hyponatremia: Minimal. Continue to follow. Possible SIADH secondary to malignancy. 3. Chronic anemia: Secondary multiple myeloma chemotherapy treatment. Appears stable. 4. History of multiple myeloma: Currently undergoing chemotherapy. Followed by Dr. Darnelle Catalan. 5. History of ischemic cardiomyopathy: Appears compensated. 6. Hypertension: Stable. 7. Chronic back pain: Stable.  Code Status:  Family Communication: Discussed with wife at bedside. Disposition Plan: Pending further evaluation and treatment.   Brendia Sacks, MD  [facility] Pager [phone] 01/11/2012, 12:16 PM    LOS: 1 day

## 2012-01-11 NOTE — ED Notes (Signed)
Pt. Given ice water.

## 2012-01-11 NOTE — Telephone Encounter (Signed)
gave patient appointment for 01-31-2012 starting at 9:15am per the notes patient is aware of the change

## 2012-01-11 NOTE — Progress Notes (Signed)
Timothy [name]   DOB:11/16/1928   WJ#:[id number]   AOZ#:[id number]  Subjective: He tells me he developed a "bunion" in his left foot, and he filed it down to bare flesh. This became secondarily infected and approximately 2 days ago developed high fevers, weakness, and near syncope, and was admitted yesterday for intravenous antibiotics and further evaluation.   Objective: Elderly white male examined in bed Filed Vitals:   01/11/12 1423  BP: 138/70  Pulse: 93  Temp: 97.6 F (36.4 [name])  Resp: 18    Body mass index is 26.11 kg/(m^2).  Intake/Output Summary (Last 24 hours) at 01/11/12 1513 Last data filed at 01/11/12 0844  Gross per 24 hour  Intake    100 ml  Output   1125 ml  Net  -1025 ml     Sclerae unicteric  Oropharynx clear  Lungs examined with the patient supine ;clear   Heart regular rate and rhythm  Abdomen benign  MSK left foot is bandaged, but the ankle is not swollen or red.  Neuro nonfocal    CBG (last 3)  No results found for this basename: GLUCAP:3 in the last 72 hours   Labs:  Lab Results  Component Value Date   WBC 7.4 01/11/2012   HGB 9.1* 01/11/2012   HCT 25.4* 01/11/2012   MCV 94.4 01/11/2012   PLT 137* 01/11/2012   NEUTROABS 8.8* 01/10/2012     Lab 01/11/12 0450 01/10/12 2100 01/10/12 1410  NA 132* -- 131*  K 3.6 -- 3.7  CL 100 -- 100  CO2 24 -- --  GLUCOSE 99 -- 100*  BUN 29* -- 38*  CREATININE 1.17 1.13 1.40*  CALCIUM 8.7 -- --  MG -- 1.4* --    Urine Studies No results found for this basename: UACOL:2,UAPR:2,USPG:2,UPH:2,UTP:2,UGL:2,UKET:2,UBIL:2,UHGB:2,UNIT:2,UROB:2,ULEU:2,UEPI:2,UWBC:2,URBC:2,UBAC:2,CAST:2,CRYS:2,UCOM:2,BILUA:2 in the last 72 hours     Studies:  Ct Foot Left Wo Contrast  01/10/2012  *RADIOLOGY REPORT*  Clinical Data: Ulcer on left foot.  History of multiple myeloma.  CT OF THE LEFT FOOT WITHOUT CONTRAST  Technique:  Multidetector CT imaging was performed according to the standard protocol. Multiplanar CT image  reconstructions were also generated.  Comparison: 01/10/2012  Findings: There is abnormal gas in the soft tissues plantar to the sesamoids, indicative of ulceration.  Gas is also present in the subcutaneous tissues along the medial ball of the foot on image 35 of series 607, with edema in the surrounding soft tissues.  No obvious drainable abscess is not observed although IV contrast was not administered.  There is atrophy of the plantar musculature of the foot.  Achilles calcaneal spur noted with some faint calcification along the plantar fascia.  No malalignment at the Lisfranc joint is noted. There is sclerosis of the medial sesamoid bone, which could be an indication of sesamoiditis, but is nonspecific with respect to osteomyelitis.  No bony destructive findings specific for osteomyelitis are noted.  There is spurring at the interphalangeal joint great toe.  The metatarsophalangeal joints are prominently extended during imaging.  Considerable subcutaneous edema is present along the ankle, with low-level edema tracking in Kager's fat pad.  IMPRESSION:  1.  Gas in the medial soft tissues of the ball of the foot, compatible with regional ulceration.  There is considerable surrounding subcutaneous edema, and cellulitis is suspected.  No definite drainable abscess is observed. 2.  There is sclerosis of the medial sesamoid which may indicate sesamoiditis, but which is not specific for osteomyelitis.  No bony destructive findings are observed. 3.  Nonspecific subcutaneous edema overlying the left side.  Original Report Authenticated By: Timothy [name], M.D.   Dg Foot Complete Left  01/10/2012  *RADIOLOGY REPORT*  Clinical Data: Left foot pain all over.  The skin ulcer.  LEFT FOOT - COMPLETE 3+ VIEW  Comparison: No priors.  Findings: AP, lateral and oblique views of the left foot to demonstrate a soft tissue irregularity underneath the first metatarsophalangeal joint, that may correspond with the reported skin  ulcer.  No definite underlying bony lysis to suggest the presence of osteomyelitis.  No acute displaced fracture, subluxation or dislocation.  IMPRESSION: 1.  No acute bony abnormalities noted.  Original Report Authenticated By: Timothy [name], M.D.    Assessment: 78 year-old with kappa light chain multiple myeloma admitted with Left foot cellulitis; he is immunocompromised due to his cancer and its treatment  (1) presenting April 2007 with anemia and renal failure; renal Bx showing free kappa light chain deposition; bone marrow biopsy showing 37% plasma cells, with normal cytogenetics and FISH; bone survey showing skull lytic lesions; treated with   (2) thalidomide (200 mg/d) and dexamethasone (40 mg/d x4d Q28d) June through Nov 2007, with good response, but poor tolerance;   (3) thalidomide decreased to 100 mg/d, dexamethasone continued, bortezomib (IV) added, Dec 2007 to March 2008   (4) treatment interrupted by multiple complications (peripheral neuropathy, pulmonary embolism, diverticular abscess, CN-V zoster, severe DDD, congestive heart failure, rising PSA); maintenance zolendronic acid through March 2012   (5) progression April 2012, treated initially with dexamethasone alone, poorly tolerated;   (6) bortezomib (sq) resumed July 2012; cyclophosphamide and dexamethasone added Sept 2012; zolendronic acid changed to Q 3months   (7) aranesp added December 2012    Plan: Currently day 2 zosyn/vancomycin. Despite his myeloma he has a normal IgG level, so he will not need IVIG to overcome this infection. In fact he already seems to be responding to the antibiotics. From a myeloma point of view it is important that he not become dehydrated, since light chains are toxic to the kidneys and require some volume to help excretion. Currently renal function is at baseline.   I am cancelling his next treatment (3/6) and rescheduling that for 3/13. [facility] [place] [facility] at 9:15 AM  for re-evaluation prior to resumption of his bortezomib. Please let me know if I can be of further help.  Timothy [name] 01/11/2012

## 2012-01-11 NOTE — Consult Note (Signed)
Reason for Consult:78 yo male with l. Foot sweling Referring Physician: hospitalists  Timothy [name] is an 78 y.o. male.  HPI: 78 yo male with known neuropathy who had a sore on l. Foot and tried some home debridement.  He started having severe swelling and reddness and he comes over for evaluation.  He is admitted to medicine and has had imagiong studies negative for osteomyelitis. We are consulted for evaluation.  Past Medical History  Diagnosis Date  . ASCVD (arteriosclerotic cardiovascular disease)   . Anemia     chronic mild anemia  . Gout   . Hypertension   . Coronary artery disease     CABG 1991  . Lumbar disc disease     post laminectomy  . Chronic back pain   . HX: long term anticoagulant use   . History of echocardiogram 11/02/2010    EF range of 30 to 35% / There is hypokinsesis of the basal-mild inferolateral myocardium  . CHF (congestive heart failure)   . Heart murmur   . Ischemic cardiomyopathy   . Renal insufficiency   . Peripheral neuropathy   . Inferior myocardial infarction 1986  . Hyperlipidemia   . GERD (gastroesophageal reflux disease)   . SOB (shortness of breath)   . Chest pain   . Fatigue   . Myeloma     Multiple myeloma, with recurrence of increasing problems  . Cancer   . Peripheral neuropathy     Past Surgical History  Procedure Date  . Laminectomy   . Inguinal hernia repair   . Cardiac catheterization 06/20/2000    Severe coronary disease (totally occluded right artery, 90% left circumflex, 50-60% intermediate, and 90-95% ostial left anterior descending)   . Tonsillectomy   . Coronary artery bypass graft 06/22/2000    x4 / Left internal mammary artery to the LAD.  / Saphenous vein graft to the right posterolateral.  /  Saphenous vein graft to  the ramus intermedius. / Saphenous vein graft to the circumflex marginal       History reviewed. No pertinent family history.  Social History:  reports that he quit smoking about 27 years ago. His  smoking use included Cigarettes. He has a 20 pack-year smoking history. He has never used smokeless tobacco. He reports that he does not drink alcohol or use illicit drugs.  Allergies: No Known Allergies  Medications: I have reviewed the patient's current medications.  Results for orders placed during the hospital encounter of 01/10/12 (from the past 48 hour(s))  CBC     Status: Abnormal   Collection Time   01/10/12  2:00 PM      Component Value Range Comment   WBC 9.8  4.0 - 10.5 (K/uL)    RBC 2.77 (*) 4.22 - 5.81 (MIL/uL)    Hemoglobin 9.3 (*) 13.0 - 17.0 (g/dL)    HCT 81.1 (*) 91.4 - 52.0 (%)    MCV 93.5  78.0 - 100.0 (fL)    MCH 33.6  26.0 - 34.0 (pg)    MCHC 35.9  30.0 - 36.0 (g/dL)    RDW 78.2 (*) 95.6 - 15.5 (%)    Platelets 125 (*) 150 - 400 (K/uL)   DIFFERENTIAL     Status: Abnormal   Collection Time   01/10/12  2:00 PM      Component Value Range Comment   Neutrophils Relative 90 (*) 43 - 77 (%)    Neutro Abs 8.8 (*) 1.7 - 7.7 (K/uL)  Lymphocytes Relative 5 (*) 12 - 46 (%)    Lymphs Abs 0.5 (*) 0.7 - 4.0 (K/uL)    Monocytes Relative 5  3 - 12 (%)    Monocytes Absolute 0.5  0.1 - 1.0 (K/uL)    Eosinophils Relative 0  0 - 5 (%)    Eosinophils Absolute 0.0  0.0 - 0.7 (K/uL)    Basophils Relative 0  0 - 1 (%)    Basophils Absolute 0.0  0.0 - 0.1 (K/uL)   POCT I-STAT, CHEM 8     Status: Abnormal   Collection Time   01/10/12  2:10 PM      Component Value Range Comment   Sodium 131 (*) 135 - 145 (mEq/L)    Potassium 3.7  3.5 - 5.1 (mEq/L)    Chloride 100  96 - 112 (mEq/L)    BUN 38 (*) 6 - 23 (mg/dL)    Creatinine, Ser 4.09 (*) 0.50 - 1.35 (mg/dL)    Glucose, Bld 811 (*) 70 - 99 (mg/dL)    Calcium, Ion 9.14  1.12 - 1.32 (mmol/L)    TCO2 22  0 - 100 (mmol/L)    Hemoglobin 8.8 (*) 13.0 - 17.0 (g/dL)    HCT 78.2 (*) 95.6 - 52.0 (%)   CBC     Status: Abnormal   Collection Time   01/10/12  9:00 PM      Component Value Range Comment   WBC 7.0  4.0 - 10.5 (K/uL)    RBC  2.76 (*) 4.22 - 5.81 (MIL/uL)    Hemoglobin 9.1 (*) 13.0 - 17.0 (g/dL)    HCT 21.3 (*) 08.6 - 52.0 (%)    MCV 93.1  78.0 - 100.0 (fL)    MCH 33.0  26.0 - 34.0 (pg)    MCHC 35.4  30.0 - 36.0 (g/dL)    RDW 57.8 (*) 46.9 - 15.5 (%)    Platelets 124 (*) 150 - 400 (K/uL)   CREATININE, SERUM     Status: Abnormal   Collection Time   01/10/12  9:00 PM      Component Value Range Comment   Creatinine, Ser 1.13  0.50 - 1.35 (mg/dL)    GFR calc non Af Amer 59 (*) >90 (mL/min)    GFR calc Af Amer 68 (*) >90 (mL/min)   MAGNESIUM     Status: Abnormal   Collection Time   01/10/12  9:00 PM      Component Value Range Comment   Magnesium 1.4 (*) 1.5 - 2.5 (mg/dL)   TSH     Status: Normal   Collection Time   01/10/12  9:00 PM      Component Value Range Comment   TSH 0.837  0.350 - 4.500 (uIU/mL)   COMPREHENSIVE METABOLIC PANEL     Status: Abnormal   Collection Time   01/11/12  4:50 AM      Component Value Range Comment   Sodium 132 (*) 135 - 145 (mEq/L)    Potassium 3.6  3.5 - 5.1 (mEq/L)    Chloride 100  96 - 112 (mEq/L)    CO2 24  19 - 32 (mEq/L)    Glucose, Bld 99  70 - 99 (mg/dL)    BUN 29 (*) 6 - 23 (mg/dL)    Creatinine, Ser 6.29  0.50 - 1.35 (mg/dL)    Calcium 8.7  8.4 - 10.5 (mg/dL)    Total Protein 5.2 (*) 6.0 - 8.3 (g/dL)    Albumin 2.3 (*)  3.5 - 5.2 (g/dL)    AST 36  0 - 37 (U/L)    ALT 35  0 - 53 (U/L)    Alkaline Phosphatase 64  39 - 117 (U/L)    Total Bilirubin 1.0  0.3 - 1.2 (mg/dL)    GFR calc non Af Amer 57 (*) >90 (mL/min)    GFR calc Af Amer 66 (*) >90 (mL/min)   CBC     Status: Abnormal   Collection Time   01/11/12  4:50 AM      Component Value Range Comment   WBC 7.4  4.0 - 10.5 (K/uL)    RBC 2.69 (*) 4.22 - 5.81 (MIL/uL)    Hemoglobin 9.1 (*) 13.0 - 17.0 (g/dL)    HCT 16.1 (*) 09.6 - 52.0 (%)    MCV 94.4  78.0 - 100.0 (fL)    MCH 33.8  26.0 - 34.0 (pg)    MCHC 35.8  30.0 - 36.0 (g/dL)    RDW 04.5 (*) 40.9 - 15.5 (%)    Platelets 137 (*) 150 - 400 (K/uL)     PROTIME-INR     Status: Normal   Collection Time   01/11/12  4:50 AM      Component Value Range Comment   Prothrombin Time 14.3  11.6 - 15.2 (seconds)    INR 1.09  0.00 - 1.49      Ct Foot Left Wo Contrast  01/10/2012  *RADIOLOGY REPORT*  Clinical Data: Ulcer on left foot.  History of multiple myeloma.  CT OF THE LEFT FOOT WITHOUT CONTRAST  Technique:  Multidetector CT imaging was performed according to the standard protocol. Multiplanar CT image reconstructions were also generated.  Comparison: 01/10/2012  Findings: There is abnormal gas in the soft tissues plantar to the sesamoids, indicative of ulceration.  Gas is also present in the subcutaneous tissues along the medial ball of the foot on image 35 of series 607, with edema in the surrounding soft tissues.  No obvious drainable abscess is not observed although IV contrast was not administered.  There is atrophy of the plantar musculature of the foot.  Achilles calcaneal spur noted with some faint calcification along the plantar fascia.  No malalignment at the Lisfranc joint is noted. There is sclerosis of the medial sesamoid bone, which could be an indication of sesamoiditis, but is nonspecific with respect to osteomyelitis.  No bony destructive findings specific for osteomyelitis are noted.  There is spurring at the interphalangeal joint great toe.  The metatarsophalangeal joints are prominently extended during imaging.  Considerable subcutaneous edema is present along the ankle, with low-level edema tracking in Kager's fat pad.  IMPRESSION:  1.  Gas in the medial soft tissues of the ball of the foot, compatible with regional ulceration.  There is considerable surrounding subcutaneous edema, and cellulitis is suspected.  No definite drainable abscess is observed. 2.  There is sclerosis of the medial sesamoid which may indicate sesamoiditis, but which is not specific for osteomyelitis.  No bony destructive findings are observed. 3.  Nonspecific  subcutaneous edema overlying the left side.  Original Report Authenticated By: Dellia Cloud, M.D.   Dg Foot Complete Left  01/10/2012  *RADIOLOGY REPORT*  Clinical Data: Left foot pain all over.  The skin ulcer.  LEFT FOOT - COMPLETE 3+ VIEW  Comparison: No priors.  Findings: AP, lateral and oblique views of the left foot to demonstrate a soft tissue irregularity underneath the first metatarsophalangeal joint, that may correspond with the  reported skin ulcer.  No definite underlying bony lysis to suggest the presence of osteomyelitis.  No acute displaced fracture, subluxation or dislocation.  IMPRESSION: 1.  No acute bony abnormalities noted.  Original Report Authenticated By: Florencia Reasons, M.D.    ROS: i have reviewed history with patient and there are no pertinent systems review as it relates to HPI EXAM: Blood pressure 114/54, pulse 89, temperature 98.1 F (36.7 C), temperature source Oral, resp. rate 12, height 5\' 10"  (1.778 m), weight 82.555 kg (182 lb), SpO2 96.00%. WDWN male NAD eyess not dilated Not using accesssory muscles of respiration No rashes on exposed skin L. Foot: Soft tissue swelling and necrotic tissue over l ball of foot min surrounding erythema  Assessment/Plan: *78 yo male with necrotic tissue and drainage from l. Foot with cellulitis responding to ABX/  Pt needs local debridement and continued ABX and local wound care.  Will plan local debridement later today and proceed with local wound care  Jisele Price L 01/11/2012, 1:35 PM

## 2012-01-11 NOTE — ED Notes (Signed)
Pt positioned in bed continues to decline medication

## 2012-01-12 LAB — VANCOMYCIN, TROUGH: Vancomycin Tr: 14 ug/mL (ref 10.0–20.0)

## 2012-01-12 MED ORDER — LISINOPRIL 20 MG PO TABS
20.0000 mg | ORAL_TABLET | Freq: Every day | ORAL | Status: DC
  Administered 2012-01-13 – 2012-01-15 (×3): 20 mg via ORAL
  Filled 2012-01-12 (×5): qty 1

## 2012-01-12 MED ORDER — HYDROCHLOROTHIAZIDE 50 MG PO TABS
50.0000 mg | ORAL_TABLET | Freq: Every day | ORAL | Status: DC
  Administered 2012-01-13 – 2012-01-15 (×3): 50 mg via ORAL
  Filled 2012-01-12 (×4): qty 1

## 2012-01-12 NOTE — Progress Notes (Signed)
Pt request 3mg  instead of 4mg  of dilaudid PO.  Notified MD.  Received new orders.  Will continue to monitor.

## 2012-01-12 NOTE — Op Note (Signed)
NAMECATO, LIBURD NO.:  [id number]  MEDICAL RECORD NO.:  [id number]  LOCATION:  1312                         FACILITY:  [facility]  PHYSICIAN:  Harvie Junior, M.D.   DATE OF BIRTH:  [birth year]  DATE OF PROCEDURE:  01/11/2012 DATE OF DISCHARGE:                              OPERATIVE REPORT   PREOPERATIVE DIAGNOSIS:  Foul smelling of ulcerated draining wound, left medial foot.  POSTOPERATIVE DIAGNOSIS:  Foul smelling of ulcerated draining wound, left medial foot.  PRINCIPAL PROCEDURE:  Excisional debridement of skin, subcutaneous tissue, and fascia, and also left foot with irrigation and debridement of wound.  SURGEON:  Harvie Junior, M.D.  ANESTHESIA:  None.  BRIEF HISTORY:  [name] is an 78 year old male who had a callus in the area on his left __________ he was treating it at home with some sort of a file and ultimately began having redness, swelling, and foul smelling drainage.  He presented to the emergency room, admitted by the hospitalist and was placed on IV antibiotics.  His erythema resolved dramatically and we were consulted for evaluation.  Evaluation at that time showed that he has a foul smelling drainage from his left foot wound and we felt that excisional debridement was appropriate.  Given that the patient had no sensation in the foot, we felt this was something we can do at the bedside and we discussed this with him and after obtaining a verbal consent for bedside excisional debridement, this procedure was undertaken.  DESCRIPTION OF PROCEDURE:  After adequate verbal consent was obtained from the patient, the patient was placed supine on the operating bed. His leg was externally rotated and some blankets were placed underneath the wound.  At this point, a Betadine was used to sterilize the area of the wound and a suture removal kit was used with an iris scissor and a pickups to remove the outer layer of skin, which gave access to  foul smelling drainage and subcutaneous tissue and fascia and also which were debrided.  Once this was completed, peroxide was used to irrigate the wound thoroughly.  Q-tips were used to explore the wound and there was no areas __________ this wound had undermining or extension.  Once this was debrided thoroughly, the wound was again irrigated and a peroxide soaked 2 x 2 was placed to the wound and 4 x 4's were used to cover it. Sterile compressive dressing was applied and the patient was monitored in the room for 10 minutes to make sure there were no postoperative problems.    Harvie Junior, M.D.    Ranae Plumber  D:  01/11/2012  T:  01/12/2012  Job:  [id number]

## 2012-01-12 NOTE — Progress Notes (Signed)
[id number] patient and wife both are very acknowledgeable. Have requested to use [facility] for home needs for all services.

## 2012-01-12 NOTE — Progress Notes (Signed)
Physical Therapy Wound Treatment Patient Details  Name: Timothy [name] MRN: [id number] Date of Birth: [birth year]  Today's Date: 01/12/2012 Time:  -     Subjective  Subjective: pt reports he has been undergoing chemotherapy Patient and Family Stated Goals: to go home  Pain Score: Pain Score:   2  Wound Assessment  Pressure Ulcer 01/10/12 Unstageable - Full thickness tissue loss in which the base of the ulcer is covered by slough (yellow, tan, gray, green or Doyce Stonehouse) and/or eschar (tan, Zayn Selley or black) in the wound bed. (Active)  Site / Wound Assessment Yellow;Pink;Black 01/12/2012  1:22 PM  % Wound base Red or Granulating 0% 01/12/2012  1:22 PM  % Wound base Yellow 50% 01/12/2012  1:22 PM  % Wound base Other (Comment) 50% 01/12/2012  1:22 PM  Wound Length (cm) 5 cm 01/12/2012  1:22 PM  Wound Width (cm) 3 cm 01/12/2012  1:22 PM  Wound Depth (cm) 0.25 cm 01/12/2012  1:22 PM  Tunneling (cm) 1 cm at 6:00 on clock 01/12/2012  1:22 PM  Margins Unattacted edges (unapproximated) 01/12/2012  1:22 PM  Drainage Amount Minimal 01/12/2012  1:22 PM  Drainage Description Odor 01/12/2012  1:22 PM  Treatment Hydrotherapy (Pulse lavage);Cleansed;Debridement (Selective) 01/12/2012  1:22 PM  Dressing Type Gauze (Comment) 01/11/2012  8:15 PM  Dressing Changed 01/12/2012  1:22 PM        Hydrotherapy Pulsed lavage therapy - wound location: left foot medial hallux area Pulsed Lavage with Suction (psi): 12 psi Pulsed Lavage with Suction - Normal Saline Used: 1000 mL Pulsed Lavage Tip: Tip with splash shield Selective Debridement Selective Debridement - Location: left foot medial hallux Selective Debridement - Tools Used: Forceps;Scissors Selective Debridement - Tissue Removed: loose thick black eschar, loose slough after PLS   Wound Assessment and Plan  Wound Therapy - Assess/Plan/Recommendations Wound Therapy - Clinical Statement: Pt with neuopathy in feet with open area on left medial foot with eschar and odor.   Pt will benefit form PLS and selective debridement of loose tissue with dressing of enzymatic debrider Wound Therapy - Functional Problem List: decreased sensation, area of potential pressure with footwear Factors Delaying/Impairing Wound Healing: Altered sensation;Multiple medical problems;Polypharmacy (thrombocyteopenia and neutropenia s/p chemo) Hydrotherapy Plan: Debridement;Patient/family education;Pulsatile lavage with suction Wound Therapy - Frequency: 6X / week Wound Therapy - Current Recommendations: PT Wound Therapy - Follow Up Recommendations: Home health RN (Dr. Luiz Blare' office)  Wound Therapy Goals- Improve the function of patient's integumentary system by progressing the wound(s) through the phases of wound healing (inflammation - proliferation - remodeling) by: Decrease Necrotic Tissue to: 50% Decrease Necrotic Tissue - Progress: Goal set today Increase Granulation Tissue to: 50% Increase Granulation Tissue - Progress: Goal set today Additional Wound Therapy Goal: decreae odor to none perceived Additional Wound Therapy Goal - Progress: Goal set today Goals/treatment plan/discharge plan were made with and agreed upon by patient/family: Yes Time For Goal Achievement: 2 weeks Wound Therapy - Potential for Goals: Good  Goals will be updated until maximal potential achieved or discharge criteria met.  Discharge criteria: when goals achieved, discharge from hospital, MD decision/surgical intervention, no progress towards goals, refusal/missing three consecutive treatments without notification or medical reason.  Ebony Hail [facility] 01/12/2012, 1:46 PM

## 2012-01-12 NOTE — Progress Notes (Signed)
Pt would like to take home meds.  Notified NP.  Will continue to monitor pt. Daphene Calamity [facility] 01/11/2012 20:40PM

## 2012-01-12 NOTE — Progress Notes (Signed)
Duplication. See progress note dated today.

## 2012-01-12 NOTE — Progress Notes (Signed)
Subjective:     Patient reports pain as minimal. 1 day S/p debridement of left foot wound.  Objective: Vital signs in last 24 hours: Temp:  [97.3 F (36.3 C)-100.1 F (37.8 C)] 97.3 F (36.3 C) (02/22 1439) Pulse Rate:  [71-82] 76  (02/22 1439) Resp:  [16-18] 18  (02/22 1439) BP: (111-121)/(58-62) 111/61 mmHg (02/22 1439) SpO2:  [95 %-96 %] 96 % (02/22 1439)  Intake/Output from previous day: 02/21 0701 - 02/22 0700 In: 3238 [I.V.:2959; IV Piggyback:279] Out: 875 [Urine:875] Intake/Output this shift: Total I/O In: 740 [P.O.:440; IV Piggyback:300] Out: 300 [Urine:300]   Basename 01/11/12 0450 01/10/12 2100 01/10/12 1410 01/10/12 1400  HGB 9.1* 9.1* 8.8* 9.3*    Basename 01/11/12 0450 01/10/12 2100  WBC 7.4 7.0  RBC 2.69* 2.76*  HCT 25.4* 25.7*  PLT 137* 124*    Basename 01/11/12 0450 01/10/12 2100 01/10/12 1410  NA 132* -- 131*  K 3.6 -- 3.7  CL 100 -- 100  CO2 24 -- --  BUN 29* -- 38*  CREATININE 1.17 1.13 --  GLUCOSE 99 -- 100*  CALCIUM 8.7 -- --    Basename 01/11/12 0450  LABPT --  INR 1.09  Left foot exam: Dressing clean and dry. Improvement of redness. No active drainage today. No redness or streaking up the leg.  Assessment/Plan:    1 day S/P Debridement of left foot wound PLAN Cont iv antibiotics x a few more days then convert to oral antibiotics. Could have Home Health IV antibiotics. Needs dry dressing change every other day. Follow up with Dr Luiz Blare next Thurs(6 days) Needs to see [facility] for wound eval and management as outpatient.  Ethleen Lormand G 01/12/2012, 3:56 PM

## 2012-01-12 NOTE — Progress Notes (Signed)
PROGRESS NOTE  VA BROADWELL ZOX:[id number] DOB: [birth year] DOA: 01/10/2012 PCP: [name], MD, MD confirmed. Cardiologist: Peter Swaziland, M.D.  Brief narrative: 78 year old man presented to the emergency department for a wound check. Also, generalized weakness and decreased appetite. Admitted for possible osteomyelitis and cellulitis.  Past medical history: Multiple myeloma currently undergoing chemotherapy, neuropathy, ischemic cardiomyopathy with left ejection fraction of 30%, anemia, gout, chronic back pain  Consultants:  Orthopedics  Wound care: Enzymatic debridement recommended as well as surgical consultation.  Procedures:  February 21: Bedside debridement foot wound.  Antibiotics:  February 20: Vancomycin  February 20: Zosyn  Interim History: Chart reviewed in detail. Status post debridement.  Subjective: Overall the patient feels okay.  Objective: Filed Vitals:   01/11/12 1740 01/11/12 2120 01/12/12 0600 01/12/12 1439  BP:  120/58 121/62 111/61  Pulse:  71 82 76  Temp: 100.1 F (37.8 C) 98.3 F (36.8 C) 99.6 F (37.6 C) 97.3 F (36.3 C)  TempSrc: Oral Oral Oral Oral  Resp:  16 16 18   Height:      Weight:      SpO2:  95% 96% 96%    Intake/Output Summary (Last 24 hours) at 01/12/12 1608 Last data filed at 01/12/12 1402  Gross per 24 hour  Intake   3978 ml  Output    875 ml  Net   3103 ml    Exam:   General:  Calm and comfortable.  Cardiovascular: Regular rate and rhythm. No murmur, rub, gallop. No lower extremity edema.  Respiratory: Clear to auscultation bilaterally. No wheezes, rales, rhonchi. Normal respiratory effort.  Skin: Wound dressed and not directly examined. Cellulitis appears resolved.  Data Reviewed: Basic Metabolic Panel:  Lab 01/11/12 8119 01/10/12 2100 01/10/12 1410  NA 132* -- 131*  K 3.6 -- 3.7  CL 100 -- 100  CO2 24 -- --  GLUCOSE 99 -- 100*  BUN 29* -- 38*  CREATININE 1.17 1.13 1.40*  CALCIUM 8.7 -- --    MG -- 1.4* --  PHOS -- -- --   Liver Function Tests:  Lab 01/11/12 0450  AST 36  ALT 35  ALKPHOS 64  BILITOT 1.0  PROT 5.2*  ALBUMIN 2.3*   CBC:  Lab 01/11/12 0450 01/10/12 2100 01/10/12 1410 01/10/12 1400  WBC 7.4 7.0 -- 9.8  NEUTROABS -- -- -- 8.8*  HGB 9.1* 9.1* 8.8* 9.3*  HCT 25.4* 25.7* 26.0* 25.9*  MCV 94.4 93.1 -- 93.5  PLT 137* 124* -- 125*   Studies: Ct Foot Left Wo Contrast  01/10/2012  *RADIOLOGY REPORT*  Clinical Data: Ulcer on left foot.  History of multiple myeloma.  CT OF THE LEFT FOOT WITHOUT CONTRAST  Technique:  Multidetector CT imaging was performed according to the standard protocol. Multiplanar CT image reconstructions were also generated.  Comparison: 01/10/2012  Findings: There is abnormal gas in the soft tissues plantar to the sesamoids, indicative of ulceration.  Gas is also present in the subcutaneous tissues along the medial ball of the foot on image 35 of series 607, with edema in the surrounding soft tissues.  No obvious drainable abscess is not observed although IV contrast was not administered.  There is atrophy of the plantar musculature of the foot.  Achilles calcaneal spur noted with some faint calcification along the plantar fascia.  No malalignment at the Lisfranc joint is noted. There is sclerosis of the medial sesamoid bone, which could be an indication of sesamoiditis, but is nonspecific with respect to osteomyelitis.  No  bony destructive findings specific for osteomyelitis are noted.  There is spurring at the interphalangeal joint great toe.  The metatarsophalangeal joints are prominently extended during imaging.  Considerable subcutaneous edema is present along the ankle, with low-level edema tracking in Kager's fat pad.  IMPRESSION:  1.  Gas in the medial soft tissues of the ball of the foot, compatible with regional ulceration.  There is considerable surrounding subcutaneous edema, and cellulitis is suspected.  No definite drainable abscess is  observed. 2.  There is sclerosis of the medial sesamoid which may indicate sesamoiditis, but which is not specific for osteomyelitis.  No bony destructive findings are observed. 3.  Nonspecific subcutaneous edema overlying the left side.  Original Report Authenticated By: Dellia Cloud, M.D.   Dg Foot Complete Left  01/10/2012  *RADIOLOGY REPORT*  Clinical Data: Left foot pain all over.  The skin ulcer.  LEFT FOOT - COMPLETE 3+ VIEW  Comparison: No priors.  Findings: AP, lateral and oblique views of the left foot to demonstrate a soft tissue irregularity underneath the first metatarsophalangeal joint, that may correspond with the reported skin ulcer.  No definite underlying bony lysis to suggest the presence of osteomyelitis.  No acute displaced fracture, subluxation or dislocation.  IMPRESSION: 1.  No acute bony abnormalities noted.  Original Report Authenticated By: Florencia Reasons, M.D.    Scheduled Meds:    . allopurinol  150 mg Oral Daily  . aspirin EC  81 mg Oral Q M,W,F  . carvedilol  3.125 mg Oral BID WC  . collagenase   Topical Daily  . feeding supplement  237 mL Oral TID WC  . gabapentin  1,200 mg Oral QHS  . gabapentin  900 mg Oral Daily  . heparin  5,000 Units Subcutaneous Q8H  . piperacillin-tazobactam (ZOSYN)  IV  3.375 g Intravenous Q8H  . simvastatin  40 mg Oral Daily  . sodium chloride  3 mL Intravenous Q12H  . valACYclovir  500 mg Oral Daily  . vancomycin  750 mg Intravenous Q12H   Continuous Infusions:    Assessment/Plan: 1. Left foot cellulitis with suspected osteomyelitis: Status post debridement. Continue IV antibiotics. Discussed with Dr. Luiz Blare today. He advises wound care and IV antibiotics for the next 2-3 days and then home with oral antibiotics. Wound care at the wound center. 2. Hyponatremia: Minimal. Possible SIADH secondary to malignancy. Followup as an outpatient. 3. Chronic anemia: Secondary to multiple myeloma and chemotherapy treatment.  Appears stable. 4. History of multiple myeloma: Currently undergoing chemotherapy. Followed by Dr. Darnelle Catalan. 5. History of ischemic cardiomyopathy: Appears compensated. 6. Hypertension: Stable. 7. Chronic back pain: Stable.  Code Status:  Family Communication:  Disposition Plan: Home pending physical therapy evaluation next 2-3 days.   Brendia Sacks, MD  [facility] Pager [phone] 01/12/2012, 4:08 PM    LOS: 2 days

## 2012-01-12 NOTE — Clinical Documentation Improvement (Signed)
Anemia Blood Loss Clarification  THIS DOCUMENT IS NOT A PERMANENT PART OF THE MEDICAL RECORD  RESPOND TO THE THIS QUERY, FOLLOW THE INSTRUCTIONS BELOW:  1. If needed, update documentation for the patient's encounter via the notes activity.  2. Access this query again and click edit on the In [employer].  3. After updating, or not, click F2 to complete all highlighted (required) fields concerning your review. Select "additional documentation in the medical record" OR "no additional documentation provided".  4. Click Sign note button.  5. The deficiency will fall out of your In Basket *Please let us know if you are not able to complete this workflow by phone or e-mail (listed below).        01/12/12  Dear Dr. Blake Divine Marton Redwood  In an effort to better capture your patient's severity of illness, reflect appropriate length of stay and utilization of resources, a review of the patient medical record has revealed the following indicators.    Based on your clinical judgment, please clarify and document in a progress note and/or discharge summary the clinical condition associated with the following supporting information:  In responding to this query please exercise your independent judgment.  The fact that a query is asked, does not imply that any particular answer is desired or expected.  Pt. admitted with left foot cellulitis ulcer,   Based on lab results HH= 9.1/25.4  Please clarify the underlying diagnosis responsible abnormal lab results and document in pn and d/c summary. Thank you!   Possible Clinical Conditions?   " Expected Acute Blood Loss Anemia  " Acute Blood Loss Anemia  " Acute on chronic blood loss anemia  " Chronic blood loss anemia   " Other Condition________________  " Cannot Clinically Determine  Risk Factors: (recent surgery, pre op anemia, EBL in OR)  Supporting Information:  Signs and Symptoms (unable to ambulate, weakness, dizziness, unable to  participate in care)  Diagnostics: Component     Latest Ref Rng 01/10/2012 01/10/2012  HGB     13.0 - 17.0 g/dL 9.3 (L) 8.8 (L)  HCT     39.0 - 52.0 % 25.9 (L) 26.0 (L)   Component     Latest Ref Rng 01/10/2012 01/11/2012  HGB     13.0 - 17.0 g/dL 9.1 (L) 9.1 (L)  HCT     39.0 - 52.0 % 25.7 (L) 25.4 (L)    Treatments: Monitoring IV fluids:  0.9 % sodium chloride infusion  Serial H&H monitoring  Reviewed: additional documentation in the medical record  Thank You,  Enis Slipper RN, BSN, CCDS Clinical Documentation Specialist [facility] Dept Pager: [phone] / E-mail: [email or web address]

## 2012-01-12 NOTE — Progress Notes (Signed)
CARE MANAGEMENT NOTE 01/12/2012  Patient:  Timothy [name], Timothy [name]   Account Number:  [id number]  Date Initiated:  01/12/2012  Documentation initiated by:  Deloma Spindle  Subjective/Objective Assessment:   deibetic foot ulcer with cellulitis covering the ball of the left foot     Action/Plan:   lives at home with wife   Anticipated DC Date:  01/15/2012   Anticipated DC Plan:  HOME W HOME HEALTH SERVICES  In-house referral  NA      DC Planning Services  CM consult      [facility] Choice  HOME HEALTH   Choice offered to / List presented to:  C-1 Patient   DME arranged  NA      DME agency  [facility]     [facility] arranged  NA      [facility] agency  [facility]   Status of service:  In process, will continue to follow Medicare Important Message given?  YES (If response is "NO", the following Medicare IM given date fields will be blank) Date Medicare IM given:  01/10/2012 Date Additional Medicare IM given:    Discharge Disposition:    Per UR Regulation:  Reviewed for med. necessity/level of care/duration of stay  Comments:  [id number]/Mckinley Adelstein,RN,BSN,CCM

## 2012-01-12 NOTE — Progress Notes (Signed)
ANTIBIOTIC CONSULT NOTE - FOLLOW UP  Pharmacy Consult for Vancomycin Indication: left foot cellulitis  No Known Allergies  Patient Measurements: Height: 5\' 10"  (177.8 cm) Weight: 182 lb (82.555 kg) IBW/kg (Calculated) : 73 kg  Vital Signs: Temp: 97.9 F (36.6 C) (02/22 1616) Temp src: Oral (02/22 1616) BP: 115/67 mmHg (02/22 1616) Pulse Rate: 67  (02/22 1616) Intake/Output from previous day: 02/21 0701 - 02/22 0700 In: 3238 [I.V.:2959; IV Piggyback:279] Out: 875 [Urine:875] Intake/Output from this shift: Total I/O In: 890 [P.O.:440; IV Piggyback:450] Out: 300 [Urine:300]  Labs:  [facility] 01/11/12 0450 01/10/12 2100 01/10/12 1410 01/10/12 1400  WBC 7.4 7.0 -- 9.8  HGB 9.1* 9.1* 8.8* --  PLT 137* 124* -- 125*  LABCREA -- -- -- --  CREATININE 1.17 1.13 1.40* --   Estimated Creatinine Clearance: 51.1 ml/min (by C-G formula based on Cr of 1.17).  Basename 01/12/12 1555  VANCOTROUGH 14.0  VANCOPEAK --  VANCORANDOM --  GENTTROUGH --  GENTPEAK --  GENTRANDOM --  TOBRATROUGH --  TOBRAPEAK --  TOBRARND --  AMIKACINPEAK --  AMIKACINTROU --  AMIKACIN --     Microbiology: No results found for this or any previous visit (from the past 720 hour(s)).  Anti-infectives     Start     Dose/Rate Route Frequency Ordered Stop   01/11/12 0400   vancomycin (VANCOCIN) 750 mg in sodium chloride 0.9 % 150 mL IVPB        750 mg 150 mL/hr over 60 Minutes Intravenous Every 12 hours 01/10/12 2058     01/10/12 2200   valACYclovir (VALTREX) tablet 500 mg        500 mg Oral Daily 01/10/12 2030     01/10/12 2200  piperacillin-tazobactam (ZOSYN) IVPB 3.375 g       3.375 g 12.5 mL/hr over 240 Minutes Intravenous Every 8 hours 01/10/12 2058     01/10/12 1400   vancomycin (VANCOCIN) IVPB 1000 mg/200 mL premix        1,000 mg 200 mL/hr over 60 Minutes Intravenous To Emergency Dept 01/10/12 1338 01/10/12 1627   01/10/12 1400  piperacillin-tazobactam (ZOSYN) IVPB 3.375 g       3.375  g 100 mL/hr over 30 Minutes Intravenous To Emergency Dept 01/10/12 1338 01/10/12 1446          Assessment: Vanco trough level is within target range (goal 10-15 for cellulitis) on Vanco 750mg  Q12h. CT not suggestive of osteomyelitis.  Goal of Therapy:  Vancomycin trough level 10-15 mcg/ml  Plan:  . Will continue Vanco 750mg  Q12h  Dorethea Clan 01/12/2012,5:01 PM

## 2012-01-12 NOTE — Clinical Documentation Improvement (Signed)
Order for dressing change stated every shift and daily.  The note by the WOCN stated daily.  Modified order so everything indicated daily dressing change.  Dressing was clean, dry and intact.  Will continue to monitor pt.  VS WNL.  Daphene Calamity [place] 11:56 PM 01/12/2012

## 2012-01-13 NOTE — Progress Notes (Signed)
PROGRESS NOTE  CELESTINO ACKERMAN AOZ:[id number] DOB: 03/22/1928 DOA: 01/10/2012 PCP: [name], MD, MD confirmed. Cardiologist: Peter Swaziland, M.D.  Brief narrative: 78 year old man presented to the emergency department for a wound check. Also, generalized weakness and decreased appetite. Admitted for possible osteomyelitis and cellulitis.  Past medical history: Multiple myeloma currently undergoing chemotherapy, neuropathy, ischemic cardiomyopathy with left ejection fraction of 30%, anemia, gout, chronic back pain  Consultants:  Orthopedics  Wound care: Enzymatic debridement recommended as well as surgical consultation.  Procedures:  February 21: Bedside debridement foot wound.  Antibiotics:  February 20: Vancomycin  February 20: Zosyn  Interim History: Interval documentation reviewed.  Subjective: Overall feels okay.  Objective: Filed Vitals:   01/12/12 1439 01/12/12 1616 01/12/12 2108 01/13/12 0452  BP: 111/61 115/67 135/66 102/62  Pulse: 76 67 76 89  Temp: 97.3 F (36.3 C) 97.9 F (36.6 C) 98.6 F (37 C) 99 F (37.2 C)  TempSrc: Oral Oral Oral Oral  Resp: 18 18 16 16   Height:      Weight:      SpO2: 96% 97% 95% 97%    Intake/Output Summary (Last 24 hours) at 01/13/12 9629 Last data filed at 01/13/12 5284  Gross per 24 hour  Intake   1250 ml  Output    750 ml  Net    500 ml    Exam:   General:  Calm and comfortable.  Cardiovascular: Regular rate and rhythm. No murmur, rub, gallop. No lower extremity edema.  Respiratory: Clear to auscultation bilaterally. No wheezes, rales, rhonchi. Normal respiratory effort.  Skin: Wound dressed and not directly examined. Cellulitis appears resolved.  Data Reviewed: Basic Metabolic Panel:  Lab 01/11/12 1324 01/10/12 2100 01/10/12 1410  NA 132* -- 131*  K 3.6 -- 3.7  CL 100 -- 100  CO2 24 -- --  GLUCOSE 99 -- 100*  BUN 29* -- 38*  CREATININE 1.17 1.13 1.40*  CALCIUM 8.7 -- --  MG -- 1.4* --  PHOS  -- -- --   Liver Function Tests:  Lab 01/11/12 0450  AST 36  ALT 35  ALKPHOS 64  BILITOT 1.0  PROT 5.2*  ALBUMIN 2.3*   CBC:  Lab 01/11/12 0450 01/10/12 2100 01/10/12 1410 01/10/12 1400  WBC 7.4 7.0 -- 9.8  NEUTROABS -- -- -- 8.8*  HGB 9.1* 9.1* 8.8* 9.3*  HCT 25.4* 25.7* 26.0* 25.9*  MCV 94.4 93.1 -- 93.5  PLT 137* 124* -- 125*   Studies: Ct Foot Left Wo Contrast  01/10/2012  *RADIOLOGY REPORT*  Clinical Data: Ulcer on left foot.  History of multiple myeloma.  CT OF THE LEFT FOOT WITHOUT CONTRAST  Technique:  Multidetector CT imaging was performed according to the standard protocol. Multiplanar CT image reconstructions were also generated.  Comparison: 01/10/2012  Findings: There is abnormal gas in the soft tissues plantar to the sesamoids, indicative of ulceration.  Gas is also present in the subcutaneous tissues along the medial ball of the foot on image 35 of series 607, with edema in the surrounding soft tissues.  No obvious drainable abscess is not observed although IV contrast was not administered.  There is atrophy of the plantar musculature of the foot.  Achilles calcaneal spur noted with some faint calcification along the plantar fascia.  No malalignment at the Lisfranc joint is noted. There is sclerosis of the medial sesamoid bone, which could be an indication of sesamoiditis, but is nonspecific with respect to osteomyelitis.  No bony destructive findings specific for osteomyelitis  are noted.  There is spurring at the interphalangeal joint great toe.  The metatarsophalangeal joints are prominently extended during imaging.  Considerable subcutaneous edema is present along the ankle, with low-level edema tracking in Kager's fat pad.  IMPRESSION:  1.  Gas in the medial soft tissues of the ball of the foot, compatible with regional ulceration.  There is considerable surrounding subcutaneous edema, and cellulitis is suspected.  No definite drainable abscess is observed. 2.  There is  sclerosis of the medial sesamoid which may indicate sesamoiditis, but which is not specific for osteomyelitis.  No bony destructive findings are observed. 3.  Nonspecific subcutaneous edema overlying the left side.  Original Report Authenticated By: Dellia Cloud, M.D.   Dg Foot Complete Left  01/10/2012  *RADIOLOGY REPORT*  Clinical Data: Left foot pain all over.  The skin ulcer.  LEFT FOOT - COMPLETE 3+ VIEW  Comparison: No priors.  Findings: AP, lateral and oblique views of the left foot to demonstrate a soft tissue irregularity underneath the first metatarsophalangeal joint, that may correspond with the reported skin ulcer.  No definite underlying bony lysis to suggest the presence of osteomyelitis.  No acute displaced fracture, subluxation or dislocation.  IMPRESSION: 1.  No acute bony abnormalities noted.  Original Report Authenticated By: Florencia Reasons, M.D.    Scheduled Meds:    . allopurinol  150 mg Oral Daily  . aspirin EC  81 mg Oral Q M,W,F  . carvedilol  3.125 mg Oral BID WC  . collagenase   Topical Daily  . feeding supplement  237 mL Oral TID WC  . gabapentin  1,200 mg Oral QHS  . gabapentin  900 mg Oral Daily  . heparin  5,000 Units Subcutaneous Q8H  . hydrochlorothiazide  50 mg Oral Daily  . lisinopril  20 mg Oral Daily  . piperacillin-tazobactam (ZOSYN)  IV  3.375 g Intravenous Q8H  . simvastatin  40 mg Oral Daily  . sodium chloride  3 mL Intravenous Q12H  . valACYclovir  500 mg Oral Daily  . vancomycin  750 mg Intravenous Q12H   Continuous Infusions:    Assessment/Plan: 1. Left foot cellulitis with suspected osteomyelitis: Status post debridement. Continue IV antibiotics. Rapidly improving. Finger transitioned to oral antibiotics one to 2 days and then home. Wound care at the wound center. 2. Hyponatremia: Minimal. Possible SIADH secondary to malignancy. Followup as an outpatient. 3. Chronic anemia: Secondary to multiple myeloma and chemotherapy treatment.  Appears stable. 4. History of multiple myeloma: Currently undergoing chemotherapy. Followed by Dr. Darnelle Catalan. 5. History of ischemic cardiomyopathy: Appears compensated. 6. Hypertension: Stable. 7. Chronic back pain: Stable.  Code Status: Full code. Family Communication:  Disposition Plan: Home pending physical therapy evaluation next 2 days.   Brendia Sacks, MD  [facility] Pager [phone] 01/13/2012, 8:08 AM    LOS: 3 days

## 2012-01-13 NOTE — Progress Notes (Signed)
Patient Details  Name: CORDARRELL SANE  MRN: [id number]  Date of Birth: 1928/02/16  Today's Date: 01/12/2012  Time: -   Subjective  Subjective: pt reports he has been undergoing chemotherapy  Patient and Family Stated Goals: to go home  Pain Score: Pain Score: 2  Wound Assessment  Pressure Ulcer 01/10/12 Unstageable - Full thickness tissue loss in which the base of the ulcer is covered by slough (yellow, tan, gray, green or brown) and/or eschar (tan, brown or black) in the wound bed. (Active)   Site / Wound Assessment  Yellow;Pink;Black  01/12/2012 1:22 PM   % Wound base Red or Granulating  0%  01/12/2012 1:22 PM   % Wound base Yellow  50%  01/12/2012 1:22 PM   % Wound base Other (Comment)  50%  01/12/2012 1:22 PM   Wound Length (cm)  5 cm  01/12/2012 1:22 PM   Wound Width (cm)  3 cm  01/12/2012 1:22 PM   Wound Depth (cm)  0.25 cm  01/12/2012 1:22 PM   Tunneling (cm)  1 cm at 6:00 on clock  01/12/2012 1:22 PM   Margins  Unattacted edges (unapproximated)  01/12/2012 1:22 PM   Drainage Amount  Minimal  01/12/2012 1:22 PM   Drainage Description  Odor  01/12/2012 1:22 PM   Treatment  Hydrotherapy (Pulse lavage);Cleansed;Debridement (Selective)  01/12/2012 1:22 PM   Dressing Type  Gauze (Comment)  01/11/2012 8:15 PM   Dressing  Changed  01/12/2012 1:22 PM       Hydrotherapy  Pulsed lavage therapy - wound location: left foot medial hallux area  Pulsed Lavage with Suction (psi): 12 psi  Pulsed Lavage with Suction - Normal Saline Used: 1000 mL  Pulsed Lavage Tip: Tip with splash shield  Selective Debridement  Selective Debridement - Location: left foot medial hallux  Selective Debridement - Tools Used: Forceps;Scissors  Selective Debridement - Tissue Removed: loose thick black eschar, loose slough after PLS  Wound Assessment and Plan  Wound Therapy - Assess/Plan/Recommendations  Wound Therapy - Clinical Statement: Pt with neuopathy in feet with open area on left medial foot with eschar and odor. Pt  will benefit form PLS and selective debridement of loose tissue with dressing of enzymatic debrider  Wound Therapy - Functional Problem List: decreased sensation, area of potential pressure with footwear  Factors Delaying/Impairing Wound Healing: Altered sensation;Multiple medical problems;Polypharmacy (thrombocyteopenia and neutropenia s/p chemo)  Hydrotherapy Plan: Debridement;Patient/family education;Pulsatile lavage with suction  Wound Therapy - Frequency: 6X / week  Wound Therapy - Current Recommendations: PT  Wound Therapy - Follow Up Recommendations: Home health RN (Dr. Luiz Blare' office)  Wound Therapy Goals-  Improve the function of patient's integumentary system by progressing the wound(s) through the phases of wound healing (inflammation - proliferation - remodeling) by:  Decrease Necrotic Tissue to: 50%  Decrease Necrotic Tissue - Progress: Goal set today  Increase Granulation Tissue to: 50%  Increase Granulation Tissue - Progress: Goal set today  Additional Wound Therapy Goal: decreae odor to none perceived  Additional Wound Therapy Goal - Progress: Goal set today  Goals/treatment plan/discharge plan were made with and agreed upon by patient/family: Yes  Time For Goal Achievement: 2 weeks  Wound Therapy - Potential for Goals: Good  Goals will be updated until maximal potential achieved or discharge criteria met. Discharge criteria: when goals achieved, discharge from hospital, MD decision/surgical intervention, no progress towards goals, refusal/missing three consecutive treatments without notification or medical reason.  **Weight bearing status requested on 2/22, will perform  PT mobility eval when WB status updated**

## 2012-01-14 NOTE — Progress Notes (Signed)
PROGRESS NOTE  Timothy [name] ZOX:[id number] DOB: 02-18-1928 DOA: 01/10/2012 PCP: [name], MD, MD confirmed. Cardiologist: Peter Swaziland, M.D.  Brief narrative: 78 year old man presented to the emergency department for a wound check. Also, generalized weakness and decreased appetite. Admitted for possible osteomyelitis and cellulitis.  Past medical history: Multiple myeloma currently undergoing chemotherapy, neuropathy, ischemic cardiomyopathy with left ejection fraction of 30%, anemia, gout, chronic back pain  Consultants:  Orthopedics  Wound care: Enzymatic debridement recommended as well as surgical consultation.  Procedures:  February 21: Bedside debridement foot wound.  Antibiotics:  February 20: Vancomycin  February 20: Zosyn  Interim History: Interval documentation reviewed.  Subjective: Overall feels okay.  Objective: Filed Vitals:   01/13/12 2120 01/14/12 0200 01/14/12 0535 01/14/12 0928  BP: 123/60  113/59 111/63  Pulse: 76  77 66  Temp: 100.7 F (38.2 C) 98.7 F (37.1 C) 98.4 F (36.9 C)   TempSrc: Oral Oral Oral   Resp: 18  18   Height:      Weight:      SpO2: 93%  95%     Intake/Output Summary (Last 24 hours) at 01/14/12 1052 Last data filed at 01/14/12 1027  Gross per 24 hour  Intake   1980 ml  Output   1075 ml  Net    905 ml    Exam:   General:  Calm and comfortable.  Cardiovascular: Regular rate and rhythm. No murmur, rub, gallop. No lower extremity edema.  Respiratory: Clear to auscultation bilaterally. No wheezes, rales, rhonchi. Normal respiratory effort.  Skin: Wound dressed and not directly examined. Cellulitis appears resolved.  Data Reviewed: Basic Metabolic Panel:  Lab 01/11/12 8119 01/10/12 2100 01/10/12 1410  NA 132* -- 131*  K 3.6 -- 3.7  CL 100 -- 100  CO2 24 -- --  GLUCOSE 99 -- 100*  BUN 29* -- 38*  CREATININE 1.17 1.13 1.40*  CALCIUM 8.7 -- --  MG -- 1.4* --  PHOS -- -- --   Liver Function  Tests:  Lab 01/11/12 0450  AST 36  ALT 35  ALKPHOS 64  BILITOT 1.0  PROT 5.2*  ALBUMIN 2.3*   CBC:  Lab 01/11/12 0450 01/10/12 2100 01/10/12 1410 01/10/12 1400  WBC 7.4 7.0 -- 9.8  NEUTROABS -- -- -- 8.8*  HGB 9.1* 9.1* 8.8* 9.3*  HCT 25.4* 25.7* 26.0* 25.9*  MCV 94.4 93.1 -- 93.5  PLT 137* 124* -- 125*   Studies: Ct Foot Left Wo Contrast  01/10/2012  *RADIOLOGY REPORT*  Clinical Data: Ulcer on left foot.  History of multiple myeloma.  CT OF THE LEFT FOOT WITHOUT CONTRAST  Technique:  Multidetector CT imaging was performed according to the standard protocol. Multiplanar CT image reconstructions were also generated.  Comparison: 01/10/2012  Findings: There is abnormal gas in the soft tissues plantar to the sesamoids, indicative of ulceration.  Gas is also present in the subcutaneous tissues along the medial ball of the foot on image 35 of series 607, with edema in the surrounding soft tissues.  No obvious drainable abscess is not observed although IV contrast was not administered.  There is atrophy of the plantar musculature of the foot.  Achilles calcaneal spur noted with some faint calcification along the plantar fascia.  No malalignment at the Lisfranc joint is noted. There is sclerosis of the medial sesamoid bone, which could be an indication of sesamoiditis, but is nonspecific with respect to osteomyelitis.  No bony destructive findings specific for osteomyelitis are noted.  There  is spurring at the interphalangeal joint great toe.  The metatarsophalangeal joints are prominently extended during imaging.  Considerable subcutaneous edema is present along the ankle, with low-level edema tracking in Kager's fat pad.  IMPRESSION:  1.  Gas in the medial soft tissues of the ball of the foot, compatible with regional ulceration.  There is considerable surrounding subcutaneous edema, and cellulitis is suspected.  No definite drainable abscess is observed. 2.  There is sclerosis of the medial  sesamoid which may indicate sesamoiditis, but which is not specific for osteomyelitis.  No bony destructive findings are observed. 3.  Nonspecific subcutaneous edema overlying the left side.  Original Report Authenticated By: Dellia Cloud, M.D.   Dg Foot Complete Left  01/10/2012  *RADIOLOGY REPORT*  Clinical Data: Left foot pain all over.  The skin ulcer.  LEFT FOOT - COMPLETE 3+ VIEW  Comparison: No priors.  Findings: AP, lateral and oblique views of the left foot to demonstrate a soft tissue irregularity underneath the first metatarsophalangeal joint, that may correspond with the reported skin ulcer.  No definite underlying bony lysis to suggest the presence of osteomyelitis.  No acute displaced fracture, subluxation or dislocation.  IMPRESSION: 1.  No acute bony abnormalities noted.  Original Report Authenticated By: Florencia Reasons, M.D.    Scheduled Meds:    . allopurinol  150 mg Oral Daily  . aspirin EC  81 mg Oral Q M,W,F  . carvedilol  3.125 mg Oral BID WC  . collagenase   Topical Daily  . feeding supplement  237 mL Oral TID WC  . gabapentin  1,200 mg Oral QHS  . gabapentin  900 mg Oral Daily  . heparin  5,000 Units Subcutaneous Q8H  . hydrochlorothiazide  50 mg Oral Daily  . lisinopril  20 mg Oral Daily  . piperacillin-tazobactam (ZOSYN)  IV  3.375 g Intravenous Q8H  . simvastatin  40 mg Oral Daily  . sodium chloride  3 mL Intravenous Q12H  . valACYclovir  500 mg Oral Daily  . vancomycin  750 mg Intravenous Q12H   Continuous Infusions:    Assessment/Plan: 1. Left foot cellulitis without evidence of osteomyelitis: Status post debridement. Continue IV antibiotics.Transition to oral antibiotics 2/25. Wound care at the wound center. 2. Hyponatremia: Minimal. Possible SIADH secondary to malignancy. Followup as an outpatient. 3. Chronic anemia: Secondary to multiple myeloma and chemotherapy treatment. Appears stable. 4. History of multiple myeloma: Currently undergoing  chemotherapy. Followed by Dr. Darnelle Catalan. 5. History of ischemic cardiomyopathy: Appears compensated. 6. Hypertension: Stable. 7. Chronic back pain: Stable.  Continues to improve. Change to oral antibiotics tomorrow. Physical therapy evaluation. Anticipate discharge home tomorrow February 25.  Code Status: Full code. Family Communication:  Disposition Plan: Home pending physical therapy evaluation next 2 days.   Brendia Sacks, MD  [facility] Pager [phone] 01/14/2012, 10:52 AM    LOS: 4 days

## 2012-01-14 NOTE — Progress Notes (Signed)
Noted that PT requested weight bearing status for left foot. He may WBAT on left and will need post op style hard sole shoe.(I could not find shoe in the orders!). Needs follow up with Dr Luiz Blare Friday afternoon in office.

## 2012-01-15 MED ORDER — SULFAMETHOXAZOLE-TMP DS 800-160 MG PO TABS
2.0000 | ORAL_TABLET | Freq: Two times a day (BID) | ORAL | Status: DC
  Administered 2012-01-15: 2 via ORAL
  Filled 2012-01-15 (×2): qty 2

## 2012-01-15 MED ORDER — AMOXICILLIN-POT CLAVULANATE 875-125 MG PO TABS: 1.0000 | ORAL_TABLET | Freq: Two times a day (BID) | ORAL | Status: AC

## 2012-01-15 MED ORDER — OXYCODONE-ACETAMINOPHEN 5-325 MG PO TABS: 1.0000 | ORAL_TABLET | Freq: Four times a day (QID) | ORAL | Status: DC | PRN

## 2012-01-15 MED ORDER — BORTEZOMIB CHEMO SQ INJECTION 3.5 MG (2.5MG/ML): 1.3000 mg/m2 | Freq: Once | INTRAMUSCULAR | Status: DC

## 2012-01-15 MED ORDER — HYDROMORPHONE HCL 4 MG PO TABS: 4.0000 mg | ORAL_TABLET | Freq: Four times a day (QID) | ORAL | Status: DC | PRN

## 2012-01-15 MED ORDER — AMOXICILLIN-POT CLAVULANATE 875-125 MG PO TABS
1.0000 | ORAL_TABLET | Freq: Two times a day (BID) | ORAL | Status: DC
  Administered 2012-01-15: 1 via ORAL
  Filled 2012-01-15 (×2): qty 1

## 2012-01-15 MED ORDER — SULFAMETHOXAZOLE-TMP DS 800-160 MG PO TABS: 2.0000 | ORAL_TABLET | Freq: Two times a day (BID) | ORAL | Status: AC

## 2012-01-15 MED ORDER — COLLAGENASE 250 UNIT/GM EX OINT: TOPICAL_OINTMENT | Freq: Every day | CUTANEOUS | Status: AC

## 2012-01-15 MED ORDER — CYCLOPHOSPHAMIDE 50 MG PO TABS: 300.0000 mg | ORAL_TABLET | ORAL | Status: DC

## 2012-01-15 NOTE — Progress Notes (Signed)
CARE MANAGEMENT NOTE 01/15/2012  Patient:  Timothy [name], Timothy [name]   Account Number:  [id number]  Date Initiated:  01/12/2012  Documentation initiated by:  Itzell Bendavid  Subjective/Objective Assessment:   deibetic foot ulcer with cellulitis covering the ball of the left foot     Action/Plan:   lives at home with wife   Anticipated DC Date:  01/15/2012   Anticipated DC Plan:  HOME W HOME HEALTH SERVICES  In-house referral  NA      DC Planning Services  CM consult      [facility] Choice  HOME HEALTH   Choice offered to / List presented to:  C-1 Patient   DME arranged  NA      DME agency  NA     HH arranged  HH-1 RN      [facility] agency  [facility]   Status of service:  In process, will continue to follow Medicare Important Message given?  YES (If response is "NO", the following Medicare IM given date fields will be blank) Date Medicare IM given:  01/10/2012 Date Additional Medicare IM given:    Discharge Disposition:  HOME W HOME HEALTH SERVICES  Per UR Regulation:  Reviewed for med. necessity/level of care/duration of stay  Comments:  [id number]/Keelen Quevedo,RN,BSN,CCM: pt discharged to home with [facility] rn for wound care  [id number]/Eamonn Sermeno,RN,BSN,CCM

## 2012-01-15 NOTE — Progress Notes (Signed)
Subjective:    pt w/o sig compliants of pain.Patient reports pain as 0 on 0-10 scale.    Objective: Vital signs in last 24 hours: Temp:  [98.3 F (36.8 C)-98.5 F (36.9 C)] 98.3 F (36.8 C) (02/25 0500) Pulse Rate:  [66-80] 80  (02/25 0500) Resp:  [14-18] 14  (02/25 0500) BP: (111-128)/(58-69) 123/58 mmHg (02/25 0500) SpO2:  [93 %-97 %] 96 % (02/25 0500)  Intake/Output from previous day: 02/24 0701 - 02/25 0700 In: 720 [P.O.:720] Out: 1025 [Urine:1025] Intake/Output this shift:    No results found for this basename: HGB:5 in the last 72 hours No results found for this basename: WBC:2,RBC:2,HCT:2,PLT:2 in the last 72 hours No results found for this basename: NA:2,K:2,CL:2,CO2:2,BUN:2,CREATININE:2,GLUCOSE:2,CALCIUM:2 in the last 72 hours No results found for this basename: LABPT:2,INR:2 in the last 72 hours  wound looks like good granulating bed of tissue.fMuch improved over the weekend  Assessment/Plan:     Advance diet i believe wound is stable and i would change to oral ABX and d/c home with local wound care .  Would use wound clinic if can be arranged or i can follow through office.   F/u Timothy [name] 2 weeks  Timothy [name] 01/15/2012, 8:11 AM

## 2012-01-15 NOTE — Progress Notes (Signed)
Norberta Keens with [facility] notified of dc and RN and Wound CARE she will contact wife for followup.

## 2012-01-15 NOTE — Progress Notes (Signed)
Physical Therapy Treatment Patient Details Name: Timothy [name] MRN: [id number] DOB: Feb 05, 1928 Today's Date: 01/15/2012  PT Assessment/Plan  PT - Assessment/Plan Comments on Treatment Session: pt much improved in ambulation this afternoon with little c/o back pain.  He prefers RW and should be OK to d/c to home with wife and family assist with RW for gait and HHPT for progress.  He did have increased drainage from foot wound after ambulating on it.  Nurse informed and dressing changed. PT Plan: Discharge plan remains appropriate PT Frequency: Min 3X/week Follow Up Recommendations: Home health PT Equipment Recommended: Rolling walker with 5" wheels PT Goals  Acute Rehab PT Goals PT Goal Formulation: With patient/family Time For Goal Achievement: 4 days Pt will go Sit to Stand: with modified independence PT Goal: Sit to Stand - Progress: Progressing toward goal Pt will Ambulate: 51 - 150 feet;with modified independence PT Goal: Ambulate - Progress: Progressing toward goal Pt will Go Up / Down Stairs: 1-2 stairs;with modified independence;with least restrictive assistive device PT Goal: Up/Down Stairs - Progress: Goal set today  PT Treatment Precautions/Restrictions  Precautions Precautions: Back Precaution Comments: pt with history of back pain Required Braces or Orthoses: No Restrictions Weight Bearing Restrictions: No (WBAT per ortho with solid bottom shoe) Mobility (including Balance) Bed Mobility Bed Mobility: Yes Rolling Right: 7: Independent Rolling Left: 7: Independent Supine to Sit: 5: Supervision Sit to Supine: 5: Supervision Transfers Transfers: Yes Sit to Stand: 5: Supervision Stand to Sit: 5: Supervision Ambulation/Gait Ambulation/Gait: Yes Ambulation/Gait Assistance: 4: Min assist Ambulation/Gait Assistance Details (indicate cue type and reason): pt limited in gait by back pain Ambulation Distance (Feet): 200 Feet Assistive device: Rolling walker (pt prefers  2 wheeled walker over 4 wheeled walker) Gait Pattern: Step-to pattern Gait velocity: functional Stairs: No Wheelchair Mobility Wheelchair Mobility: No  Posture/Postural Control Posture/Postural Control: No significant limitations Postural Limitations: pt unable to stand erect because of back pain Balance Balance Assessed: No Exercise  General Exercises - Lower Extremity Gluteal Sets: AROM;Both;5 reps Heel Slides: AROM;Both;10 reps Hip ABduction/ADduction: AROM;Both;5 reps;Sidelying End of Session PT - End of Session Equipment Utilized During Treatment: Gait belt Activity Tolerance: Patient tolerated treatment well Patient left: in bed;with family/visitor present;with bed alarm set Nurse Communication: Mobility status for ambulation General Behavior During Session: [facility] for tasks performed Cognition: [facility] for tasks performed  Donnetta Hail 01/15/2012, 2:15 PM

## 2012-01-15 NOTE — Progress Notes (Signed)
9Physical Therapy Wound Treatment Patient Details  Name: Timothy [name] MRN: [id number] Date of Birth: Nov 20, 1928  Today's Date: 01/15/2012 Time:  9:40-10:00    Subjective     Pain Score: Pain Score: 0-No pain  Wound Assessment  Pressure Ulcer 01/10/12 Unstageable - Full thickness tissue loss in which the base of the ulcer is covered by slough (yellow, tan, gray, green or Lovis More) and/or eschar (tan, Jshaun Abernathy or black) in the wound bed. (Active)  Site / Wound Assessment Yellow 01/15/2012 10:00 AM  % Wound base Red or Granulating 0% 01/15/2012 11:36 AM  % Wound base Yellow 75% 01/15/2012 11:36 AM  % Wound base Black 25% 01/15/2012 11:36 AM  % Wound base Other (Comment)  01/12/2012  1:22 PM  Peri-wound Assessment Intact 01/15/2012 11:36 AM  Wound Length (cm) 5 cm 01/13/2012  3:00 AM  Wound Width (cm) 3 cm 01/13/2012  3:00 AM  Wound Depth (cm) 0.25 cm 01/13/2012  3:00 AM  Tunneling (cm) 1 cm at 6:00 on the clock 01/13/2012  3:00 AM  Margins Unattacted edges (unapproximated) 01/15/2012 11:36 AM  Drainage Amount Minimal 01/15/2012 11:36 AM  Drainage Description No odor small amount of pululent drainage with pressure from center of foot 01/15/2012 11:36 AM  Treatment Hydrotherapy (Pulse lavage);Debridement (Selective);Cleansed 01/15/2012 11:36 AM  Dressing Type Gauze (Comment) 01/14/2012  8:00 PM  Dressing Changed 01/15/2012 11:36 AM     Wound 01/11/12 Other (Comment) Foot Left pt has non healing wound on left foot.wound has necrotic tissuse. wound is 5cm lengh. foul odor present with yellowish drainage (Active)   Hydrotherapy Pulsed lavage therapy - wound location: left foot Pulsed Lavage with Suction (psi): 12 psi Pulsed Lavage with Suction - Normal Saline Used: 1000 mL Pulsed Lavage Tip: Tip with splash shield Selective Debridement Selective Debridement - Location: left foot wound Selective Debridement - Tools Used: Scissors;Forceps Selective Debridement - Tissue Removed: thick black eschar, thick  yellow slough   Wound Assessment and Plan  Wound Therapy - Assess/Plan/Recommendations Wound Therapy - Clinical Statement: Wound with slow improvement,  Odor decreased, thick eschar removed, but pt still with slough over wound.  Wound Therapy - Functional Problem List: decreased sensation Factors Delaying/Impairing Wound Healing: Altered sensation;Multiple medical problems;Polypharmacy Hydrotherapy Plan: Debridement;Patient/family education;Pulsatile lavage with suction Wound Therapy - Frequency: 6X / week Wound Therapy - Follow Up Recommendations: [facility] (v. Dr Luiz Blare' office)  Wound Therapy Goals- Improve the function of patient's integumentary system by progressing the wound(s) through the phases of wound healing (inflammation - proliferation - remodeling) by: Decrease Necrotic Tissue - Progress: Progressing toward goal Increase Granulation Tissue - Progress: Mot progressing Additional Wound Therapy Goal - Progress: Met  Goals will be updated until maximal potential achieved or discharge criteria met.  Discharge criteria: when goals achieved, discharge from hospital, MD decision/surgical intervention, no progress towards goals, refusal/missing three consecutive treatments without notification or medical reason.  [facility] 01/15/2012, 11:48 AM

## 2012-01-15 NOTE — Progress Notes (Signed)
IV removed previous to D/C, Pt discharged to home with spouse with expectation of home health provider to contact in regards to setting up future care in the home.   Timothy [name]

## 2012-01-15 NOTE — Evaluation (Signed)
Physical Therapy Evaluation Patient Details Name: Timothy [name] MRN: [id number] DOB: May 04, 1928 Today's Date: 01/15/2012   Recommend HHPT and RW for home   Problem List:  Patient Active Problem List  Diagnoses  . Ischemic cardiomyopathy  . Multiple myeloma  . Shortness of breath dyspnea  . Hypertension  . Renal insufficiency  . Myeloma  . Anemia  . Systolic CHF, chronic  . CAD (coronary artery disease)  . S/P CABG (coronary artery bypass graft)  . Foot ulcer, left  . Hyponatremia    Past Medical History:  Past Medical History  Diagnosis Date  . ASCVD (arteriosclerotic cardiovascular disease)   . Anemia     chronic mild anemia  . Gout   . Hypertension   . Coronary artery disease     CABG 1991  . Lumbar disc disease     post laminectomy  . Chronic back pain   . HX: long term anticoagulant use   . History of echocardiogram 11/02/2010    EF range of 30 to 35% / There is hypokinsesis of the basal-mild inferolateral myocardium  . CHF (congestive heart failure)   . Heart murmur   . Ischemic cardiomyopathy   . Renal insufficiency   . Peripheral neuropathy   . Inferior myocardial infarction 1986  . Hyperlipidemia   . GERD (gastroesophageal reflux disease)   . SOB (shortness of breath)   . Chest pain   . Fatigue   . Myeloma     Multiple myeloma, with recurrence of increasing problems  . Cancer   . Peripheral neuropathy    Past Surgical History:  Past Surgical History  Procedure Date  . Laminectomy   . Inguinal hernia repair   . Cardiac catheterization 06/20/2000    Severe coronary disease (totally occluded right artery, 90% left circumflex, 50-60% intermediate, and 90-95% ostial left anterior descending)   . Tonsillectomy   . Coronary artery bypass graft 06/22/2000    x4 / Left internal mammary artery to the LAD.  / Saphenous vein graft to the right posterolateral.  /  Saphenous vein graft to  the ramus intermedius. / Saphenous vein graft to the circumflex  marginal       PT Assessment/Plan/Recommendation PT Assessment Clinical Impression Statement: pt admitted for wound cellulitis of left foot, under treatment for multiple myeloma is limited in functional mobility from back pain.  He will need continued PT to assess for correct assistive device and to improve frunctional moblity to prepare for d/c to home. PT Recommendation/Assessment: Patient will need skilled PT in the acute care venue PT Problem List: Pain;Decreased activity tolerance PT Therapy Diagnosis : Difficulty walking;Abnormality of gait;Acute pain PT Plan PT Frequency: Min 3X/week PT Treatment/Interventions: DME instruction;Gait training;Functional mobility training;Therapeutic exercise;Stair training PT Recommendation Follow Up Recommendations: Home health PT Equipment Recommended: Rolling walker with 5" wheels PT Goals  Acute Rehab PT Goals PT Goal Formulation: With patient/family Time For Goal Achievement: 4 days Pt will go Sit to Stand: with modified independence PT Goal: Sit to Stand - Progress: Goal set today Pt will Ambulate: 51 - 150 feet;with modified independence PT Goal: Ambulate - Progress: Goal set today Pt will Go Up / Down Stairs: 1-2 stairs;with modified independence;with least restrictive assistive device PT Goal: Up/Down Stairs - Progress: Goal set today  PT Evaluation Precautions/Restrictions  Precautions Precautions: Back Precaution Comments: pt with history of back pain Required Braces or Orthoses: No Restrictions Weight Bearing Restrictions: No (WBAT per ortho with solid bottom shoe) Prior Functioning  Home  Living Lives With: Spouse Receives Help From: Family Type of Home: House Home Layout: One level Home Access: Stairs to enter [employer] of Steps: 1 Home Adaptive Equipment: Walker - standard Prior Function Level of Independence: Independent with gait;Independent with transfers Able to Take Stairs?:  Yes Cognition Cognition Arousal/Alertness: Awake/alert Overall Cognitive Status: Appears within functional limits for tasks assessed Cognition - Other Comments: retired Chiropractor: Impaired by gross assessment Stereognosis: Impaired by gross assessment Hot/Cold: Impaired by gross assessment Proprioception: Impaired by gross assessment Additional Comments: pt with peripheral neuropathy Coordination Gross Motor Movements are Fluid and Coordinated: Yes Fine Motor Movements are Fluid and Coordinated: Yes Extremity Assessment RLE Assessment RLE Assessment: Within Functional Limits LLE Assessment LLE Assessment: Within Functional Limits (except for dressing and flat bottomed shoe on left foot) Mobility (including Balance) Bed Mobility Bed Mobility: Yes Rolling Right: 7: Independent Rolling Left: 7: Independent Supine to Sit: 5: Supervision Transfers Transfers: Yes Sit to Stand: 4: Min assist Stand to Sit: 4: Min assist Ambulation/Gait Ambulation/Gait: Yes Ambulation/Gait Assistance: 4: Min assist Ambulation/Gait Assistance Details (indicate cue type and reason): pt limited in gait by back pain Ambulation Distance (Feet): 3 Feet (3) Assistive device: Rolling walker Gait Pattern: Step-to pattern Gait velocity: functional Stairs: No Wheelchair Mobility Wheelchair Mobility: No  Posture/Postural Control Posture/Postural Control: Postural limitations Postural Limitations: pt unable to stand erect because of back pain Balance Balance Assessed: No Exercise  General Exercises - Lower Extremity Gluteal Sets: AROM;Both;5 reps Heel Slides: AROM;Both;10 reps Hip ABduction/ADduction: AROM;Both;5 reps;Sidelying End of Session PT - End of Session Equipment Utilized During Treatment: Gait belt Activity Tolerance: Patient limited by pain Patient left: in chair;with family/visitor present;with call bell in reach Nurse Communication:  Mobility status for ambulation;Mobility status for transfers General Behavior During Session: [facility] for tasks performed Cognition: [facility] for tasks performed  Donnetta Hail 01/15/2012, 2:04 PM

## 2012-01-15 NOTE — Progress Notes (Signed)
PROGRESS NOTE  Timothy [name]:[id number] DOB: 1928/05/18 DOA: 01/10/2012 PCP: [name], MD, MD confirmed. Cardiologist: Peter Swaziland, M.D.  Brief narrative: 78 year old man presented to the emergency department for a wound check. Also, generalized weakness and decreased appetite. Admitted for possible osteomyelitis and cellulitis.  Past medical history: Multiple myeloma currently undergoing chemotherapy, neuropathy, ischemic cardiomyopathy with left ejection fraction of 30%, anemia, gout, chronic back pain  Consultants:  Orthopedics  Wound care: Enzymatic debridement recommended as well as surgical consultation.  Procedures:  February 21: Bedside debridement foot wound.  Antibiotics:  February 20: Vancomycin  February 20: Zosyn  Interim History: Interval documentation reviewed. Per orthopedics patient stable for discharge today.  Subjective: Overall feels okay but feels generally weak.  Objective: Filed Vitals:   01/14/12 1416 01/14/12 1649 01/14/12 2035 01/15/12 0500  BP: 119/63 128/69 116/65 123/58  Pulse: 67 68 67 80  Temp: 98.5 F (36.9 C)  98.4 F (36.9 C) 98.3 F (36.8 C)  TempSrc: Oral   Oral  Resp: 18  16 14   Height:      Weight:      SpO2: 93%  97% 96%    Intake/Output Summary (Last 24 hours) at 01/15/12 1120 Last data filed at 01/15/12 8119  Gross per 24 hour  Intake    360 ml  Output    650 ml  Net   -290 ml    Exam:   General:  Calm and comfortable.  Cardiovascular: Regular rate and rhythm. No murmur, rub, gallop. No lower extremity edema.  Respiratory: Clear to auscultation bilaterally. No wheezes, rales, rhonchi. Normal respiratory effort.  Skin: Wound dressed and not directly examined. Cellulitis appears resolved.  Data Reviewed: Basic Metabolic Panel:  Lab 01/11/12 1478 01/10/12 2100 01/10/12 1410  NA 132* -- 131*  K 3.6 -- 3.7  CL 100 -- 100  CO2 24 -- --  GLUCOSE 99 -- 100*  BUN 29* -- 38*  CREATININE 1.17  1.13 1.40*  CALCIUM 8.7 -- --  MG -- 1.4* --  PHOS -- -- --   Liver Function Tests:  Lab 01/11/12 0450  AST 36  ALT 35  ALKPHOS 64  BILITOT 1.0  PROT 5.2*  ALBUMIN 2.3*   CBC:  Lab 01/11/12 0450 01/10/12 2100 01/10/12 1410 01/10/12 1400  WBC 7.4 7.0 -- 9.8  NEUTROABS -- -- -- 8.8*  HGB 9.1* 9.1* 8.8* 9.3*  HCT 25.4* 25.7* 26.0* 25.9*  MCV 94.4 93.1 -- 93.5  PLT 137* 124* -- 125*   Studies: Ct Foot Left Wo Contrast  01/10/2012  *RADIOLOGY REPORT*  Clinical Data: Ulcer on left foot.  History of multiple myeloma.  CT OF THE LEFT FOOT WITHOUT CONTRAST  Technique:  Multidetector CT imaging was performed according to the standard protocol. Multiplanar CT image reconstructions were also generated.  Comparison: 01/10/2012  Findings: There is abnormal gas in the soft tissues plantar to the sesamoids, indicative of ulceration.  Gas is also present in the subcutaneous tissues along the medial ball of the foot on image 35 of series 607, with edema in the surrounding soft tissues.  No obvious drainable abscess is not observed although IV contrast was not administered.  There is atrophy of the plantar musculature of the foot.  Achilles calcaneal spur noted with some faint calcification along the plantar fascia.  No malalignment at the Lisfranc joint is noted. There is sclerosis of the medial sesamoid bone, which could be an indication of sesamoiditis, but is nonspecific with respect to osteomyelitis.  No bony destructive findings specific for osteomyelitis are noted.  There is spurring at the interphalangeal joint great toe.  The metatarsophalangeal joints are prominently extended during imaging.  Considerable subcutaneous edema is present along the ankle, with low-level edema tracking in Kager's fat pad.  IMPRESSION:  1.  Gas in the medial soft tissues of the ball of the foot, compatible with regional ulceration.  There is considerable surrounding subcutaneous edema, and cellulitis is suspected.  No  definite drainable abscess is observed. 2.  There is sclerosis of the medial sesamoid which may indicate sesamoiditis, but which is not specific for osteomyelitis.  No bony destructive findings are observed. 3.  Nonspecific subcutaneous edema overlying the left side.  Original Report Authenticated By: Dellia Cloud, M.D.   Dg Foot Complete Left  01/10/2012  *RADIOLOGY REPORT*  Clinical Data: Left foot pain all over.  The skin ulcer.  LEFT FOOT - COMPLETE 3+ VIEW  Comparison: No priors.  Findings: AP, lateral and oblique views of the left foot to demonstrate a soft tissue irregularity underneath the first metatarsophalangeal joint, that may correspond with the reported skin ulcer.  No definite underlying bony lysis to suggest the presence of osteomyelitis.  No acute displaced fracture, subluxation or dislocation.  IMPRESSION: 1.  No acute bony abnormalities noted.  Original Report Authenticated By: Florencia Reasons, M.D.    Scheduled Meds:    . allopurinol  150 mg Oral Daily  . aspirin EC  81 mg Oral Q M,W,F  . carvedilol  3.125 mg Oral BID WC  . collagenase   Topical Daily  . feeding supplement  237 mL Oral TID WC  . gabapentin  1,200 mg Oral QHS  . gabapentin  900 mg Oral Daily  . heparin  5,000 Units Subcutaneous Q8H  . hydrochlorothiazide  50 mg Oral Daily  . lisinopril  20 mg Oral Daily  . piperacillin-tazobactam (ZOSYN)  IV  3.375 g Intravenous Q8H  . simvastatin  40 mg Oral Daily  . sodium chloride  3 mL Intravenous Q12H  . valACYclovir  500 mg Oral Daily  . vancomycin  750 mg Intravenous Q12H   Continuous Infusions:    Assessment/Plan: 1. Left foot cellulitis without evidence of osteomyelitis: Status post debridement. Transition to oral antibiotics 2/25. Wound care at home and can follow with Dr. Luiz Blare in the office. 2. Hyponatremia: Minimal. Possible SIADH secondary to malignancy. Followup as an outpatient. 3. Chronic anemia: Secondary to multiple myeloma and  chemotherapy treatment. Appears stable. 4. History of multiple myeloma: Currently undergoing chemotherapy. Followed by Dr. Darnelle Catalan. 5. History of ischemic cardiomyopathy: Appears compensated. 6. Hypertension: Stable. 7. Chronic back pain: Stable.  Followup in the office with Dr. Luiz Blare in 2 weeks.  Code Status: Full code. Family Communication:  Disposition Plan: Home today with home health physical therapy and wound care.   Brendia Sacks, MD  [facility] Pager [phone] 01/15/2012, 11:20 AM    LOS: 5 days

## 2012-01-15 NOTE — Discharge Summary (Signed)
Physician Discharge Summary  Timothy [name] ZOX:[id number] DOB: [birth year] DOA: 01/10/2012  PCP: Timothy Dell, MD, MD Cardiologist: Timothy [name], M.D. Orthopedic surgeon: Timothy [name], M.D.  Admit date: 01/10/2012 Discharge date: 01/15/2012  Discharge Diagnoses:  [name]. Left foot cellulitis without evidence of osteomyelitis 2. Hyponatremia, stable 3. Chronic anemia, stable 4. Multiple myeloma, stable 5. Chronic ischemic cardiomyopathy, stable  Discharge Condition: Improved  Disposition: Home with home health physical therapy and RN for wound care  History of present illness:  78 year old man presented to the emergency department for a wound check. Also, generalized weakness and decreased appetite. Admitted for possible osteomyelitis and cellulitis.  Hospital Course:  Mr. Timothy [name] was admitted to the medical floor. He was started on broad-spectrum antibiotics and seen in consultation with orthopedics. Imaging was negative for abscess or osteomyelitis of his foot. His cellulitis rapidly improved and he underwent bedside debridement successfully. He has been transitioned to oral antibiotics and cleared by orthopedics for discharge. He will go home with home health physical therapy, RN for wound care and he may followup with Dr. Gwenette [name] office for further wound care. These other issues as outlined below. [name]. Left foot cellulitis without evidence of osteomyelitis: Status post debridement. Transition to oral antibiotics 2/25. Wound care at home and can follow with Dr. Luiz [name] in the office.  2. Hyponatremia: Minimal. Possible SIADH secondary to malignancy. Followup as an outpatient.  3. Chronic anemia: Secondary to multiple myeloma and chemotherapy treatment. Appears stable.  4. History of multiple myeloma: Currently undergoing chemotherapy. Followed by Dr. Darnelle [name].  5. History of ischemic cardiomyopathy: Appears compensated.  6. Hypertension: Stable.  7. Chronic back pain:  Stable.  Consultants:  Orthopedics   Wound care  Procedures:  February 21: Bedside debridement foot wound.  Discharge Instructions  Discharge Orders    Future Appointments: Provider: Department: Dept Phone: Center:   01/31/2012 9:15 AM Timothy [name] [facility] [phone] None   01/31/2012 9:45 AM Timothy Allyson Sabal, PA [facility] [phone] None   01/31/2012 10:45 AM Timothy [name] C10 [facility] [phone] None   02/21/2012 10:15 AM Timothy [name] [facility] [phone] None   02/21/2012 10:45 AM Timothy Allyson Sabal, PA [facility] [phone] None   02/21/2012 11:45 AM Timothy [name] C9 [facility] [phone] None   02/26/2012 [name]:00 PM Timothy [name] [facility] [phone] Timothy [name]   02/28/2012 8:30 AM Timothy [name] [facility] [phone] None   02/28/2012 9:00 AM Timothy Dell, MD [facility] [phone] None   02/28/2012 10:15 AM Timothy [name] C9 [facility] [phone] None     Future Orders Please Complete By Expires   Diet - low sodium heart healthy      Increase activity slowly      Discharge wound care:      Comments:   Apply to left foot ulcer, [name]/8" thick layer to all necrotic tissue, cover with normal saline moist gauze, wrap with kerlix. To be applied daily      Medication List  As of 01/15/2012 12:35 PM   TAKE these medications         acetaminophen 500 MG tablet   Commonly known as: TYLENOL   Take 500 mg by mouth as needed.      allopurinol 300 MG tablet   Commonly known as: ZYLOPRIM   Take 150 mg by mouth daily. PT TAKES [name]/2 TAB FOR 150 MG DOSE      amoxicillin-clavulanate 875-125 MG per tablet   Commonly known as: AUGMENTIN   Take [name] tablet by mouth  every 12 (twelve) hours. Start evening February 25      aspirin 81 MG tablet   Take 81 mg by mouth every Monday, Wednesday, and Friday.      atorvastatin 40 MG tablet   Commonly known as: LIPITOR   Take 20 mg by mouth daily. PT TAKES [name]/2 TAB FOR A 20 MG DOSE      B COMPLETE PO   Take  0.5 tablets by mouth 2 (two) times daily.      bortezomib SQ 3.5 MG Solr   Commonly known as: VELCADE   Inject [name].[name] mLs (2.75 mg total) into the skin once. As directed. [facility]. PT GETS THIS 2 WEEKS ON,2 WEEKS OFF. FOLLOWED BY DR MARGINATE.    next injection's due 01-24-12      carvedilol 3.125 MG tablet   Commonly known as: COREG   Take [name] tablet (3.125 mg total) by mouth 2 (two) times daily with a meal.      CO Q-10 PO   Take [name] tablet by mouth daily.      collagenase ointment   Commonly known as: SANTYL   Apply topically daily. Apply to left foot ulcer, [name]/8" thick layer to all necrotic tissue, cover with normal saline moist gauze, wrap with kerlix. To be applied daily.      cyclophosphamide 50 MG tablet   Commonly known as: CYTOXAN   Take 6 tablets (300 mg total) by mouth See admin instructions. Give on an empty stomach [name] hour before or 2 hours after meals.PT Takes 6 tablets on Saturday. 6 tablets on Sunday for a full dose of 600 mg total      dexamethasone 4 MG tablet   Commonly known as: DECADRON   Take 12 mg by mouth See admin instructions. Take after chemo pt takes 3 tabs on Saturday. 3 tabs on Sunday.      FISH OIL PO   Take [name] capsule by mouth 2 (two) times daily.      gabapentin 300 MG capsule   Commonly known as: NEURONTIN   Take 900-[name],200 mg by mouth 2 (two) times daily. PT TAKES 3 CAPSULES IN THE MORNING FOR 900 MG DOSE. 4 CAPS AT BEDTIME FOR 1200 MG DOSE.      hydrochlorothiazide 25 MG tablet   Commonly known as: HYDRODIURIL   Take 50 mg by mouth daily.      HYDROmorphone 4 MG tablet   Commonly known as: DILAUDID   Take [name] tablet (4 mg total) by mouth every 6 (six) hours as needed for pain.      lisinopril 20 MG tablet   Commonly known as: PRINIVIL,ZESTRIL   Take [name] tablet (20 mg total) by mouth daily.      multivitamin per tablet   Take 0.5 tablets by mouth 2 (two) times daily.      sulfamethoxazole-trimethoprim  800-160 MG per tablet   Commonly known as: BACTRIM DS   Take 2 tablets by mouth every 12 (twelve) hours.      triamterene-hydrochlorothiazide 37.5-25 MG per capsule   Commonly known as: DYAZIDE   Take [name] tablet by mouth Daily. Take [name] and [name]/2 daily      valACYclovir 500 MG tablet   Commonly known as: VALTREX   Take 500 mg by mouth daily. CONT.           Follow-up Information    Follow up with MAGRINAT,GUSTAV C, MD in 2 weeks.  Follow up with GRAVES,JOHN L, MD. Schedule an appointment as soon as possible for a visit in 2 weeks.   Contact information:   [address] [place] [address] [phone]           The results of significant diagnostics from this hospitalization (including imaging, microbiology, ancillary and laboratory) are listed below for reference.    Significant Diagnostic Studies: Ct Foot Left Wo Contrast  01/10/2012  *RADIOLOGY REPORT*  Clinical Data: Ulcer on left foot.  History of multiple myeloma.  CT OF THE LEFT FOOT WITHOUT CONTRAST  Technique:  Multidetector CT imaging was performed according to the standard protocol. Multiplanar CT image reconstructions were also generated.  Comparison: 01/10/2012  Findings: There is abnormal gas in the soft tissues plantar to the sesamoids, indicative of ulceration.  Gas is also present in the subcutaneous tissues along the medial ball of the foot on image 35 of series 607, with edema in the surrounding soft tissues.  No obvious drainable abscess is not observed although IV contrast was not administered.  There is atrophy of the plantar musculature of the foot.  Achilles calcaneal spur noted with some faint calcification along the plantar fascia.  No malalignment at the Lisfranc joint is noted. There is sclerosis of the medial sesamoid bone, which could be an indication of sesamoiditis, but is nonspecific with respect to osteomyelitis.  No bony destructive findings specific for osteomyelitis are noted.  There  is spurring at the interphalangeal joint great toe.  The metatarsophalangeal joints are prominently extended during imaging.  Considerable subcutaneous edema is present along the ankle, with low-level edema tracking in Kager's fat pad.  IMPRESSION:  [name].  Gas in the medial soft tissues of the ball of the foot, compatible with regional ulceration.  There is considerable surrounding subcutaneous edema, and cellulitis is suspected.  No definite drainable abscess is observed. 2.  There is sclerosis of the medial sesamoid which may indicate sesamoiditis, but which is not specific for osteomyelitis.  No bony destructive findings are observed. 3.  Nonspecific subcutaneous edema overlying the left side.  Original Report Authenticated By: Dellia Cloud, M.D.   Dg Foot Complete Left  01/10/2012  *RADIOLOGY REPORT*  Clinical Data: Left foot pain all over.  The skin ulcer.  LEFT FOOT - COMPLETE 3+ VIEW  Comparison: No priors.  Findings: AP, lateral and oblique views of the left foot to demonstrate a soft tissue irregularity underneath the first metatarsophalangeal joint, that may correspond with the reported skin ulcer.  No definite underlying bony lysis to suggest the presence of osteomyelitis.  No acute displaced fracture, subluxation or dislocation.  IMPRESSION: [name].  No acute bony abnormalities noted.  Original Report Authenticated By: Florencia Reasons, M.D.   Labs: Basic Metabolic Panel:  Lab 01/11/12 9147 01/10/12 2100 01/10/12 1410  NA 132* -- 131*  K 3.6 -- 3.7  CL 100 -- 100  CO2 24 -- --  GLUCOSE 99 -- 100*  BUN 29* -- 38*  CREATININE [name].17 [name].13 [name].40*  CALCIUM 8.7 -- --  MG -- [name].4* --  PHOS -- -- --   Liver Function Tests:  Lab 01/11/12 0450  AST 36  ALT 35  ALKPHOS 64  BILITOT [name].0  PROT 5.2*  ALBUMIN 2.3*   CBC:  Lab 01/11/12 0450 01/10/12 2100 01/10/12 1410 01/10/12 1400  WBC 7.4 7.0 -- 9.8  NEUTROABS -- -- -- 8.8*  HGB 9.[name]* 9.[name]* 8.8* 9.3*  HCT 25.4* 25.7* 26.0* 25.9*  MCV  94.4 93.[name] -- 93.5  PLT 137* 124* -- 125*   Time coordinating discharge: 35 minutes.  Signed:  Brendia Sacks, MD  [facility] 01/15/2012, 12:27 PM

## 2012-01-22 ENCOUNTER — Encounter: Payer: Self-pay | Admitting: *Deleted

## 2012-01-22 NOTE — Progress Notes (Unsigned)
Per MD this RN verified per inquiry by [name] MD recommendation to hold all treatment - Velcade as well as oral decadron and ctx until he is seen for evaluation and given ok to resume treatment plan due to recent inpt stay for infection.

## 2012-01-24 ENCOUNTER — Ambulatory Visit: Payer: Medicare Other

## 2012-01-24 ENCOUNTER — Ambulatory Visit: Payer: Medicare Other | Admitting: Physician Assistant

## 2012-01-24 ENCOUNTER — Other Ambulatory Visit: Payer: Medicare Other | Admitting: Lab

## 2012-01-31 ENCOUNTER — Ambulatory Visit: Payer: Medicare Other

## 2012-01-31 ENCOUNTER — Ambulatory Visit (HOSPITAL_BASED_OUTPATIENT_CLINIC_OR_DEPARTMENT_OTHER): Payer: Medicare Other | Admitting: Physician Assistant

## 2012-01-31 ENCOUNTER — Other Ambulatory Visit (HOSPITAL_BASED_OUTPATIENT_CLINIC_OR_DEPARTMENT_OTHER): Payer: Medicare Other

## 2012-01-31 ENCOUNTER — Ambulatory Visit (HOSPITAL_COMMUNITY)
Admission: RE | Admit: 2012-01-31 | Discharge: 2012-01-31 | Disposition: A | Discharge status: Home / Self Care | Payer: Medicare Other | Source: Ambulatory Visit | Attending: Orthopedic Surgery | Admitting: Orthopedic Surgery

## 2012-01-31 ENCOUNTER — Encounter: Payer: Self-pay | Admitting: Physician Assistant

## 2012-01-31 ENCOUNTER — Telehealth: Payer: Self-pay | Admitting: Oncology

## 2012-01-31 DIAGNOSIS — L97509 Non-pressure chronic ulcer of other part of unspecified foot with unspecified severity: Secondary | ICD-10-CM | POA: Insufficient documentation

## 2012-01-31 DIAGNOSIS — L97529 Non-pressure chronic ulcer of other part of left foot with unspecified severity: Secondary | ICD-10-CM

## 2012-01-31 DIAGNOSIS — C9000 Multiple myeloma not having achieved remission: Secondary | ICD-10-CM

## 2012-01-31 DIAGNOSIS — S91109A Unspecified open wound of unspecified toe(s) without damage to nail, initial encounter: Secondary | ICD-10-CM

## 2012-01-31 DIAGNOSIS — F329 Major depressive disorder, single episode, unspecified: Secondary | ICD-10-CM

## 2012-01-31 DIAGNOSIS — S91302A Unspecified open wound, left foot, initial encounter: Secondary | ICD-10-CM

## 2012-01-31 LAB — CBC WITH DIFFERENTIAL/PLATELET
Basophils Absolute: 0 10*3/uL (ref 0.0–0.1)
EOS%: 0.3 % (ref 0.0–7.0)
Eosinophils Absolute: 0 10*3/uL (ref 0.0–0.5)
HGB: 9.2 g/dL — ABNORMAL LOW (ref 13.0–17.1)
LYMPH%: 13.8 % — ABNORMAL LOW (ref 14.0–49.0)
MCH: 32.7 pg (ref 27.2–33.4)
MCV: 94.3 fL (ref 79.3–98.0)
MONO%: 12 % (ref 0.0–14.0)
NEUT#: 6.5 10*3/uL (ref 1.5–6.5)
Platelets: 176 10*3/uL (ref 140–400)
RDW: 16.8 % — ABNORMAL HIGH (ref 11.0–14.6)

## 2012-01-31 MED ORDER — PREDNISONE 5 MG PO TABS
ORAL_TABLET | ORAL | Status: DC
Start: 2012-01-31 — End: 2012-03-01

## 2012-01-31 MED ORDER — FLUDROCORTISONE ACETATE 0.1 MG PO TABS: 0.1000 mg | ORAL_TABLET | Freq: Every day | ORAL | Status: DC

## 2012-01-31 MED ORDER — TAMSULOSIN HCL 0.4 MG PO CAPS: 0.4000 mg | ORAL_CAPSULE | Freq: Every day | ORAL | Status: DC

## 2012-01-31 MED ORDER — CITALOPRAM HYDROBROMIDE 10 MG PO TABS: 10.0000 mg | ORAL_TABLET | Freq: Every day | ORAL | Status: DC

## 2012-01-31 NOTE — Progress Notes (Signed)
ID: Timothy [name]   DOB: 09/02/1928  MR#: [id number]  WUJ#:[id number]  HISTORY OF PRESENT ILLNESS: Patient presented in April 2007 with symptoms of symptomatic slowly progressive anemia with iron parameters, folic acid, and vitamin B12 within normal limits.  Plans were made to obtain bone marrow aspirate and biopsy as an outpatient at that time. Unfortunately the patient was hospitalized shortly thereafter with acute on chronic renal failure with a creatinine of 8.6. Renal biopsy demonstrated the presence of past nephropathy and light chain deposition disease. Biopsy demonstrated 37% plasma cells by aspirate differential. A skeletal survey demonstrated small calvarial lytic lesions, mild osteopenia, and cervical spondylosis.  Patient was treated from June of 2007 through November of 2007 with thalidomide, 200 mg daily, and dexamethasone at 40 mg on days one through 4 of each 28 day cycle. The patient achieved a partial response with this 2 drug therapy, free kappa light chain concentration decreasing to 42.2 by November 2007.  The thalidomide dose was then decreased due to progressive toxicities. Bortezomib was added and dexamethasone was continued. With patient achieving near normalization of his serum free light chain level by January 2008.  There is disease progression in April 2012, and patient was treated with 20 mg of dexamethasone on Mondays and Tuesdays of each week. This was discontinued due to problems with insomnia. Single agent bortesomib was started on 05/31/2011, given subcutaneously on days 18 and 15 of each 28 day cycle.  Cyclophosphamide and dexamethasone were added again in September 2012, both drugs given orally every Saturday and Sunday, with the continuation of subcutaneous bortezomib as before.  Patient also receive zoledronic acid, most recently given on a every 3 month basis.   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 Interval history is remarkable for recent hospitalization with surgical debridement for a nonhealing wound on the left foot. The wound is on the ball of the foot, just below the big toe. This continues to be followed and is still not healed. He has met with Dr. Luiz Blare to discuss further treatment, the concern being that this may not heal and the toe may need to be amputated. He is scheduled for a Doppler study later today to evaluate blood flow and is also scheduled to be seen at the wound clinic.  Needless to say, this has caused [name] some anxiety. He is very down today. He presents in a wheelchair. He is unable to play golf or do his gardening. We've also had to hold treatment for multiple myeloma due to the recent infection. Of course he is not taking the dexamethasone weekly as before. His blood pressures dropped, down to 90/49 today. He is off his hypertensive medications, with the exception of  HCTZ 25 mg.  Fortunately, he has had only a slight increase in his pain level. He does have some "shooting pains" in that left foot. He takes Dilaudid as before for the pain and for the peripheral neuropathy. His energy level is very low. He's had no fevers, chills, or night sweats. No abnormal headaches or dizziness. No nausea or emesis. No change in bowel habits.   A detailed review of systems is otherwise noncontributory as noted below.  Review of Systems: Constitutional:  Depressed; generally weak; no weight loss, fever, night sweats Eyes: negative NFA:[id number] Cardiovascular: no chest pain or dyspnea on exertion Respiratory: no cough, shortness of breath, or wheezing Neurological: no TIA or stroke symptoms  Positive for neuropathy in lower extremities Dermatological: negative Gastrointestinal: no abdominal pain, nausea, change in bowel habits, or black or bloody stools Genito-Urinary: no dysuria, trouble voiding, or hematuria Hematological and Lymphatic:  negative Musculoskeletal: positive for - pain in foot - left Remaining ROS negative.     PAST MEDICAL HISTORY: Past Medical History  Diagnosis Date  . ASCVD (arteriosclerotic cardiovascular disease)   . Anemia     chronic mild anemia  . Gout   . Hypertension   . Coronary artery disease     CABG 1991  . Lumbar disc disease     post laminectomy  . Chronic back pain   . HX: long term anticoagulant use   . History of echocardiogram 11/02/2010    EF range of 30 to 35% / There is hypokinsesis of the basal-mild inferolateral myocardium  . CHF (congestive heart failure)   . Heart murmur   . Ischemic cardiomyopathy   . Renal insufficiency   . Peripheral neuropathy   . Inferior myocardial infarction 1986  . Hyperlipidemia   . GERD (gastroesophageal reflux disease)   . SOB (shortness of breath)   . Chest pain   . Fatigue   . Myeloma     Multiple myeloma, with recurrence of increasing problems  . Cancer   . Peripheral neuropathy     PAST SURGICAL HISTORY: Past Surgical History  Procedure Date  . Laminectomy   . Inguinal hernia repair   . Cardiac catheterization 06/20/2000    Severe coronary disease (totally occluded right artery, 90% left circumflex, 50-60% intermediate, and 90-95% ostial left anterior descending)   . Tonsillectomy   . Coronary artery bypass graft 06/22/2000    x4 / Left internal mammary artery to the LAD.  / Saphenous vein graft to the right posterolateral.  /  Saphenous vein graft to  the ramus intermedius. / Saphenous vein graft to the circumflex marginal       FAMILY HISTORY No family history on file.    ADVANCED DIRECTIVES:  HEALTH MAINTENANCE: History  Substance Use Topics  . Smoking status: Former Smoker -- 1.0 packs/day for 20 years    Types: Cigarettes    Quit date: 11/20/1984  . Smokeless tobacco: Never Used  . Alcohol Use: No     Colonoscopy:  PAP:  Bone density:  Lipid panel:  No Known Allergies  Current Outpatient  Prescriptions  Medication Sig Dispense Refill  . acetaminophen (TYLENOL) 500 MG tablet Take 500 mg by mouth as needed.        Marland Kitchen allopurinol (ZYLOPRIM) 300 MG tablet Take 150 mg by mouth daily. PT TAKES 1/2 TAB FOR 150 MG DOSE      . aspirin 81 MG tablet Take 81 mg by mouth every Monday, Wednesday, and Friday.       Marland Kitchen atorvastatin (LIPITOR) 40 MG tablet Take 20 mg by mouth daily. PT TAKES 1/2 TAB FOR A 20 MG DOSE      . B Complex-Biotin-FA (B COMPLETE PO) Take 0.5 tablets by mouth 2 (two) times daily.       . Coenzyme Q10 (CO Q-10 PO) Take 1 tablet by mouth daily.        . hydrochlorothiazide (HYDRODIURIL) 25 MG tablet Take 50 mg by mouth daily.       Marland Kitchen HYDROmorphone (DILAUDID) 4 MG tablet Take 1 tablet (4 mg total) by mouth every 6 (six) hours as needed for pain.  40 tablet  0  . multivitamin (THERAGRAN) per tablet Take 0.5  tablets by mouth 2 (two) times daily.       . Omega-3 Fatty Acids (FISH OIL PO) Take 1 capsule by mouth 2 (two) times daily.        . Tamsulosin HCl (FLOMAX) 0.4 MG CAPS Take 1 capsule (0.4 mg total) by mouth daily.  90 capsule  1  . valACYclovir (VALTREX) 500 MG tablet Take 500 mg by mouth daily. CONT.      Marland Kitchen bortezomib SQ (VELCADE) 3.5 MG SOLR Inject 1.1 mLs (2.75 mg total) into the skin once. As directed. [facility] 01[facility]03-12. PT GETS THIS 2 WEEKS ON,2 WEEKS OFF. FOLLOWED BY DR MARGINATE.    next injection's due 01-24-12      . carvedilol (COREG) 3.125 MG tablet Take 1 tablet (3.125 mg total) by mouth 2 (two) times daily with a meal.  180 tablet  3  . citalopram (CELEXA) 10 MG tablet Take 1 tablet (10 mg total) by mouth daily.  30 tablet  2  . cyclophosphamide (CYTOXAN) 50 MG tablet Take 6 tablets (300 mg total) by mouth See admin instructions. Give on an empty stomach 1 hour before or 2 hours after meals.PT Takes 6 tablets on Saturday. 6 tablets on Sunday for a full dose of 600 mg total      . dexamethasone (DECADRON) 4 MG tablet Take 12 mg by  mouth See admin instructions. Take after chemo pt takes 3 tabs on Saturday. 3 tabs on Sunday.      . fludrocortisone (FLORINEF) 0.1 MG tablet Take 1 tablet (0.1 mg total) by mouth daily.  30 tablet  1  . gabapentin (NEURONTIN) 300 MG capsule Take 900-1,200 mg by mouth 2 (two) times daily. PT TAKES 3 CAPSULES IN THE MORNING FOR 900 MG DOSE. 4 CAPS AT BEDTIME FOR 1200 MG DOSE.      Marland Kitchen lisinopril (PRINIVIL,ZESTRIL) 20 MG tablet Take 1 tablet (20 mg total) by mouth daily.  90 tablet  3  . predniSONE (DELTASONE) 5 MG tablet 1 po daily x 5 days, then 1 po every other day x 10 days  10 tablet  0  . SANTYL ointment         OBJECTIVE: Filed Vitals:   01/31/12 0942  BP: 90/49  Pulse: 71  Temp: 97.6 F (36.4 C)     Body mass index is 24.91 kg/(m^2).    ECOG FS: 1  Physical Exam: Patient presents in a wheelchair. HEENT:  Sclerae anicteric, conjunctivae pink.   Nodes:  No cervical, supraclavicular, or axillary lymphadenopathy palpated.  Lungs:  Clear to auscultation bilaterally.  No crackles, rhonchi, or wheezes.   Heart:  Regular rate and rhythm.   Abdomen:  Soft, nontender.  Positive bowel sounds.  No organomegaly or masses palpated.   Musculoskeletal:  No focal spinal tenderness to palpation.  Extremities: Nonpitting pedal edema bilaterally, equal bilaterally. No upper extremity edema. Clean dry dressing and orthopedic boot intact on the left foot. Skin:  Benign.   Neuro:  Nonfocal.    LAB RESULTS: Lab Results  Component Value Date   WBC 8.8 01/31/2012   NEUTROABS 6.5 01/31/2012   HGB 9.2* 01/31/2012   HCT 26.5* 01/31/2012   MCV 94.3 01/31/2012   PLT 176 01/31/2012      Chemistry      Component Value Date/Time   NA 132* 01/11/2012 0450   K 3.6 01/11/2012 0450   CL 100 01/11/2012 0450   CO2 24 01/11/2012 0450   BUN  29* 01/11/2012 0450   CREATININE 1.17 01/11/2012 0450      Component Value Date/Time   CALCIUM 8.7 01/11/2012 0450   ALKPHOS 64 01/11/2012 0450   AST 36 01/11/2012 0450   ALT  35 01/11/2012 0450   BILITOT 1.0 01/11/2012 0450      STUDIES: Ct Foot Left Wo Contrast  01/10/2012  *RADIOLOGY REPORT*  Clinical Data: Ulcer on left foot.  History of multiple myeloma.  CT OF THE LEFT FOOT WITHOUT CONTRAST  Technique:  Multidetector CT imaging was performed according to the standard protocol. Multiplanar CT image reconstructions were also generated.  Comparison: 01/10/2012  Findings: There is abnormal gas in the soft tissues plantar to the sesamoids, indicative of ulceration.  Gas is also present in the subcutaneous tissues along the medial ball of the foot on image 35 of series 607, with edema in the surrounding soft tissues.  No obvious drainable abscess is not observed although IV contrast was not administered.  There is atrophy of the plantar musculature of the foot.  Achilles calcaneal spur noted with some faint calcification along the plantar fascia.  No malalignment at the Lisfranc joint is noted. There is sclerosis of the medial sesamoid bone, which could be an indication of sesamoiditis, but is nonspecific with respect to osteomyelitis.  No bony destructive findings specific for osteomyelitis are noted.  There is spurring at the interphalangeal joint great toe.  The metatarsophalangeal joints are prominently extended during imaging.  Considerable subcutaneous edema is present along the ankle, with low-level edema tracking in Kager's fat pad.  IMPRESSION:  1.  Gas in the medial soft tissues of the ball of the foot, compatible with regional ulceration.  There is considerable surrounding subcutaneous edema, and cellulitis is suspected.  No definite drainable abscess is observed. 2.  There is sclerosis of the medial sesamoid which may indicate sesamoiditis, but which is not specific for osteomyelitis.  No bony destructive findings are observed. 3.  Nonspecific subcutaneous edema overlying the left side.  Original Report Authenticated By: Dellia Cloud, M.D.   Dg Foot Complete  Left  01/10/2012  *RADIOLOGY REPORT*  Clinical Data: Left foot pain all over.  The skin ulcer.  LEFT FOOT - COMPLETE 3+ VIEW  Comparison: No priors.  Findings: AP, lateral and oblique views of the left foot to demonstrate a soft tissue irregularity underneath the first metatarsophalangeal joint, that may correspond with the reported skin ulcer.  No definite underlying bony lysis to suggest the presence of osteomyelitis.  No acute displaced fracture, subluxation or dislocation.  IMPRESSION: 1.  No acute bony abnormalities noted.  Original Report Authenticated By: Florencia Reasons, M.D.    ASSESSMENT: 78 year-old with kappa light chain multiple myeloma  (1) presenting April 2007 with anemia and renal failure; renal Bx showing free kappa light chain deposition; bone marrow biopsy showing 37% plasma cells, with normal cytogenetics and FISH;bone survey showing skull lytic lesions; treated with  (2) thalidomide (200 mg/d) and dexamethasone (40 mg/d x4d Q28d) June through Nov 2007, with good response, but poor tolerance;  (3) thalidomide decreased to 100 mg/d, dexamethasone continued, bortezomib (IV) added, Dec 2007 to March 2008  (4) treatment interrupted by multiple complications (peripheral neuropathy, pulmonary embolism, diverticular abscess, CN V zoster, severe DDD, congestive heart failure, rising PSA); maintenance zolendronic acid through March 2012  (5) progression April 2012, treated initially with dexamethasone alone, poorly tolerated;  (6) bortezomib (sq) resumed July 2012; cyclophosphamide and dexamethasone added Sept 2012; zolendronic acid changed to  Q 3months  (7) aranesp started December 2012  PLAN: This case was reviewed with Dr. Darnelle Catalan who also spoke with the patient and his family today. We will continue to hold all treatment, pending resolution of the left foot wound. He'll proceed to Doppler today as scheduled, and as noted above is also scheduled to be seen in the wound clinic.  We  discussed [name] depression, and he is very much in favor with beginning an antidepressant. I am starting him on Celexa 10 mg daily, and we will reassess at his next appointment for both tolerance and the effectiveness.   With regards to his hypotension and his fatigue, Dr. Darnelle Catalan has suggested fludrocortisone, 0.1 mg daily. We will also start him on a short course of prednisone, 5 mg daily x5 days, then 5 mg every other day for 10 days. He'll return in approximately 2 weeks for followup with Dr. Darnelle Catalan.  Timothy [name]    01/31/2012

## 2012-01-31 NOTE — Progress Notes (Signed)
Bilateral lower extremity arterial duplex with ABI completed at 13:30.  Preliminary report is ABI is 0.85 on the right and 0.97 on the left.  Duplex imaging demonstrates no evidence of significant stenosis bilaterally.

## 2012-01-31 NOTE — Telephone Encounter (Signed)
gve the pt's wife the march 2013 appt calendar °

## 2012-02-02 ENCOUNTER — Other Ambulatory Visit: Payer: Self-pay | Admitting: *Deleted

## 2012-02-02 NOTE — Telephone Encounter (Signed)
Rx for Flomax .4mg  called to [facility] in [place]. Disp. #10 tabs. Rec. Call from spouse stating that pt rec. His RX from "mailorder" and he is "just about out" request for 10 tabs called to local pharmacy.

## 2012-02-09 ENCOUNTER — Other Ambulatory Visit (HOSPITAL_BASED_OUTPATIENT_CLINIC_OR_DEPARTMENT_OTHER): Payer: Medicare Other

## 2012-02-09 ENCOUNTER — Telehealth: Payer: Self-pay | Admitting: *Deleted

## 2012-02-09 ENCOUNTER — Ambulatory Visit (HOSPITAL_BASED_OUTPATIENT_CLINIC_OR_DEPARTMENT_OTHER): Payer: Medicare Other | Admitting: Oncology

## 2012-02-09 DIAGNOSIS — L97509 Non-pressure chronic ulcer of other part of unspecified foot with unspecified severity: Secondary | ICD-10-CM

## 2012-02-09 DIAGNOSIS — C9000 Multiple myeloma not having achieved remission: Secondary | ICD-10-CM

## 2012-02-09 LAB — CBC WITH DIFFERENTIAL/PLATELET
BASO%: 1.6 % (ref 0.0–2.0)
EOS%: 0.1 % (ref 0.0–7.0)
LYMPH%: 20.3 % (ref 14.0–49.0)
MCH: 34.6 pg — ABNORMAL HIGH (ref 27.2–33.4)
MCHC: 34.8 g/dL (ref 32.0–36.0)
MCV: 99.3 fL — ABNORMAL HIGH (ref 79.3–98.0)
MONO%: 8.2 % (ref 0.0–14.0)
Platelets: 264 10*3/uL (ref 140–400)
RBC: 2.7 10*6/uL — ABNORMAL LOW (ref 4.20–5.82)
RDW: 18.1 % — ABNORMAL HIGH (ref 11.0–14.6)

## 2012-02-09 NOTE — Progress Notes (Signed)
ID: [name]   DOB: [birth year]  MR#: [id number]  WUJ#:[id number]  HISTORY OF PRESENT ILLNESS: Patient presented in April 2007 with symptoms of symptomatic slowly progressive anemia with iron parameters, folic acid, and vitamin B12 within normal limits. Plans were made to obtain bone marrow aspirate and biopsy as an outpatient, however the patient was hospitalized shortly thereafter with a creatinine of 8.6. Renal biopsy demonstrated the presence of  light chain deposition disease. Bone marrow biopsy demonstrated 37% plasma cells.l. A skeletal survey showed small calvarial lytic lesions, mild osteopenia, and cervical spondylosis. With a well-established diagnosis of light-chain multiple myeloma [name] subsequent treatments are as detailed below.   INTERVAL HISTORY: Take returns today with his wife, [name], for followup of his multiple myeloma and his left foot ulcer. This was recently debrided and cultured. He has an appointment with our wound clinic next week.  REVIEW OF SYSTEMS: At the last visit here he was very depressed and hypotensive. Part of this early was going to be due to renal insufficiency since we have stopped his weekly dexamethasone. I put him on prednisone at low dose for 5 days and he is currently on an every other day taper. I also started him on hydrocortisone. His blood pressure is better, he appears much more interactive, and there is no evidence of overt depression, although he is certainly very frustrated with his foot situation. He is sleeping poorly, and for some reason he is no longer taking gabapentin at bedtime. I encouraged him to resume that. He is short of breath when walking up stairs which is not a new symptom. He has had some loose bowel movements. Previously he had been constipated secondary to dilated but he interrupted that medication. He continues to have back pain but no fevers, no rash, no nausea or vomiting. A detailed review of systems was otherwise  stable.  PAST MEDICAL HISTORY: Past Medical History  Diagnosis Date  . ASCVD (arteriosclerotic cardiovascular disease)   . Anemia     chronic mild anemia  . Gout   . Hypertension   . Coronary artery disease     CABG 1991  . Lumbar disc disease     post laminectomy  . Chronic back pain   . HX: long term anticoagulant use   . History of echocardiogram 11/02/2010    EF range of 30 to 35% / There is hypokinsesis of the basal-mild inferolateral myocardium  . CHF (congestive heart failure)   . Heart murmur   . Ischemic cardiomyopathy   . Renal insufficiency   . Peripheral neuropathy   . Inferior myocardial infarction 1986  . Hyperlipidemia   . GERD (gastroesophageal reflux disease)   . SOB (shortness of breath)   . Chest pain   . Fatigue   . Myeloma     Multiple myeloma, with recurrence of increasing problems  . Cancer   . Peripheral neuropathy    PAST SURGICAL HISTORY: Past Surgical History  Procedure Date  . Laminectomy   . Inguinal hernia repair   . Cardiac catheterization 06/20/2000    Severe coronary disease (totally occluded right artery, 90% left circumflex, 50-60% intermediate, and 90-95% ostial left anterior descending)   . Tonsillectomy   . Coronary artery bypass graft 06/22/2000    x4 / Left internal mammary artery to the LAD.  / Saphenous vein graft to the right posterolateral.  /  Saphenous vein graft to  the ramus intermedius. 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 Smoking status: Former Smoker -- 1.0 packs/day for 20 years    Types: Cigarettes    Quit date: 11/20/1984  . Smokeless tobacco: Never Used  . Alcohol Use: No     Colonoscopy:  PAS:  Bone  density:  Lipid panel:  No Known Allergies  Current Outpatient Prescriptions  Medication Sig Dispense Refill  . acetaminophen (TYLENOL) 500 MG tablet Take 500 mg by mouth as needed.        Marland Kitchen aspirin 81 MG tablet Take 81 mg by mouth every Monday, Wednesday, and Friday.       Marland Kitchen atorvastatin (LIPITOR) 40 MG tablet Take 20 mg by mouth daily. PT TAKES 1/2 TAB FOR A 20 MG DOSE      . B Complex-Biotin-FA (B COMPLETE PO) Take 0.5 tablets by mouth 2 (two) times daily.       . carvedilol (COREG) 3.125 MG tablet Take 1 tablet (3.125 mg total) by mouth 2 (two) times daily with a meal.  180 tablet  3  . citalopram (CELEXA) 10 MG tablet Take 1 tablet (10 mg total) by mouth daily.  30 tablet  2  . Coenzyme Q10 (CO Q-10 PO) Take 1 tablet by mouth daily.        . fludrocortisone (FLORINEF) 0.1 MG tablet Take 1 tablet (0.1 mg total) by mouth daily.  30 tablet  1  . hydrochlorothiazide (HYDRODIURIL) 25 MG tablet Take 50 mg by mouth daily.       Marland Kitchen HYDROmorphone (DILAUDID) 4 MG tablet Take 1 tablet (4 mg total) by mouth every 6 (six) hours as needed for pain.  40 tablet  0  . multivitamin (THERAGRAN) per tablet Take 0.5 tablets by mouth 2 (two) times daily.       . Omega-3 Fatty Acids (FISH OIL PO) Take 1 capsule by mouth 2 (two) times daily.        Marland Kitchen SANTYL ointment       . Tamsulosin HCl (FLOMAX) 0.4 MG CAPS Take 1 capsule (0.4 mg total) by mouth daily.  90 capsule  1  . valACYclovir (VALTREX) 500 MG tablet Take 500 mg by mouth daily. CONT.      Marland Kitchen allopurinol (ZYLOPRIM) 300 MG tablet Take 150 mg by mouth daily. PT TAKES 1/2 TAB FOR 150 MG DOSE      . bortezomib SQ (VELCADE) 3.5 MG SOLR Inject 1.1 mLs (2.75 mg total) into the skin once. As directed. PT [facility]JECTION [facility] 01-03-12. PT GETS THIS 2 WEEKS ON,2 WEEKS OFF. FOLLOWED BY DR MARGINATE.    next injection's due 01-24-12      . cyclophosphamide (CYTOXAN) 50 MG tablet Take 6 tablets (300 mg total) by mouth See admin instructions. Give on  an empty stomach 1 hour before or 2 hours after meals.PT Takes 6 tablets on Saturday. 6 tablets on Sunday for a full dose of 600 mg total      . dexamethasone (DECADRON) 4 MG tablet Take 12 mg by mouth See admin instructions. Take after chemo pt takes 3 tabs on Saturday. 3 tabs on Sunday.      . gabapentin (NEURONTIN) 300 MG capsule Take 900-1,200 mg by mouth 2 (two) times daily. PT TAKES 3 CAPSULES IN THE MORNING  FOR 900 MG DOSE. 4 CAPS AT BEDTIME FOR 1200 MG DOSE.      Marland Kitchen lisinopril (PRINIVIL,ZESTRIL) 20 MG tablet Take 1 tablet (20 mg total) by mouth daily.  90 tablet  3  . predniSONE (DELTASONE) 5 MG tablet 1 po daily x 5 days, then 1 po every other day x 10 days  10 tablet  0    OBJECTIVE: Elderly white male who appears uncomfortable Filed Vitals:   02/09/12 0958  BP: 105/65  Pulse: 62  Temp: 97.4 F (36.3 C)     Body mass index is 25.10 kg/(m^2).    ECOG FS: 2  Sclerae unicteric Oropharynx clear No peripheral adenopathy Lungs no rales or rhonchi Heart regular rate and rhythm Abd soft, nontender, positive bowel sounds  MSK no focal spinal tenderness, no peripheral edema; eye on bandage his left foot ulcer, which is separately photographed. Neuro: nonfocal  LAB RESULTS: Lab Results  Component Value Date   WBC 6.7 02/09/2012   NEUTROABS 4.7 02/09/2012   HGB 9.3* 02/09/2012   HCT 26.8* 02/09/2012   MCV 99.3* 02/09/2012   PLT 264 02/09/2012      Chemistry      Component Value Date/Time   NA 132* 01/11/2012 0450   K 3.6 01/11/2012 0450   CL 100 01/11/2012 0450   CO2 24 01/11/2012 0450   BUN 29* 01/11/2012 0450   CREATININE 1.17 01/11/2012 0450      Component Value Date/Time   CALCIUM 8.7 01/11/2012 0450   ALKPHOS 64 01/11/2012 0450   AST 36 01/11/2012 0450   ALT 35 01/11/2012 0450   BILITOT 1.0 01/11/2012 0450       No results found for this basename: LABCA2    No components found with this basename: LABCA125    No results found for this basename: INR:1;PROTIME:1 in the  last 168 hours  Urinalysis No results found for this basename: colorurine, appearanceur, labspec, phurine, glucoseu, hgbur, bilirubinur, ketonesur, proteinur, urobilinogen, nitrite, leukocytesur    STUDIES: Ct Foot Left Wo Contrast  01/10/2012  *RADIOLOGY REPORT*  Clinical Data: Ulcer on left foot.  History of multiple myeloma.  CT OF THE LEFT FOOT WITHOUT CONTRAST  Technique:  Multidetector CT imaging was performed according to the standard protocol. Multiplanar CT image reconstructions were also generated.  Comparison: 01/10/2012  Findings: There is abnormal gas in the soft tissues plantar to the sesamoids, indicative of ulceration.  Gas is also present in the subcutaneous tissues along the medial ball of the foot on image 35 of series 607, with edema in the surrounding soft tissues.  No obvious drainable abscess is not observed although IV contrast was not administered.  There is atrophy of the plantar musculature of the foot.  Achilles calcaneal spur noted with some faint calcification along the plantar fascia.  No malalignment at the Lisfranc joint is noted. There is sclerosis of the medial sesamoid bone, which could be an indication of sesamoiditis, but is nonspecific with respect to osteomyelitis.  No bony destructive findings specific for osteomyelitis are noted.  There is spurring at the interphalangeal joint great toe.  The metatarsophalangeal joints are prominently extended during imaging.  Considerable subcutaneous edema is present along the ankle, with low-level edema tracking in Kager's fat pad.  IMPRESSION:  1.  Gas in the medial soft tissues of the ball of the foot, compatible with regional ulceration.  There is considerable surrounding subcutaneous edema, and cellulitis is suspected.  No definite drainable abscess is observed. 2.  There  is sclerosis of the medial sesamoid which may indicate sesamoiditis, but which is not specific for osteomyelitis.  No bony destructive findings are observed.  3.  Nonspecific subcutaneous edema overlying the left side.  Original Report Authenticated By: Dellia Cloud, M.D.    ASSESSMENT: 78 year-old with kappa light chain multiple myeloma  (1) presenting April 2007 with anemia and renal failure; renal Bx showing free kappa light chain deposition; bone marrow biopsy showing 37% plasma cells, with normal cytogenetics and FISH;bone survey showing skull lytic lesions; treated with  (2) thalidomide (200 mg/d) and dexamethasone (40 mg/d x4d Q28d) June through Nov 2007, with good response, but poor tolerance;  (3) thalidomide decreased to 100 mg/d, dexamethasone continued, bortezomib (IV) added, Dec 2007 to March 2008  (4) treatment interrupted by multiple complications (peripheral neuropathy, pulmonary embolism, diverticular abscess, CN V zoster, severe DDD, congestive heart failure, rising PSA); maintenance zolendronic acid through March 2012  (5) progression April 2012, treated initially with dexamethasone alone, poorly tolerated;  (6) bortezomib (sq) resumed July 2012; cyclophosphamide and dexamethasone added Sept 2012; zolendronic acid changed to Q 3months; all treatments held as of March 2013 due to the development of the Left foot ulcer described above (7) aranesp started December 2012   PLAN: He has now been off his myeloma treatment for about a month. He is on a prednisone taper, but will continue on the fludrocortisone unless he becomes hypertensive. It is a little early for him to be responding to the Celexa, but I hope this will improve his mood as she goes through the next few frustrating months trying to get his left foot ulcer to heal.  I have recommended that he go back to gabapentin 300-60 mg at bedtime, and we will continue to follow his labs here on a monthly basis. He was seen again in 3 months. He knows to call for any problems that may develop before the next visit   Syndey Jaskolski C    02/09/2012

## 2012-02-09 NOTE — Telephone Encounter (Signed)
gave patient appointments for lab only 03-07-2012 and 04-04-2012 lab only mailed out calendar and gave to the patient on 02-09-2012

## 2012-02-09 NOTE — Telephone Encounter (Signed)
mailed out calendar to inform the patient of the new date and times on 03-07-2012 and 04-11-2012

## 2012-02-12 ENCOUNTER — Telehealth: Payer: Self-pay | Admitting: *Deleted

## 2012-02-12 NOTE — Telephone Encounter (Signed)
mailed out calendar on 02-12-2012

## 2012-02-14 ENCOUNTER — Encounter (HOSPITAL_BASED_OUTPATIENT_CLINIC_OR_DEPARTMENT_OTHER): Payer: Medicare Other | Attending: Plastic Surgery

## 2012-02-14 DIAGNOSIS — L97509 Non-pressure chronic ulcer of other part of unspecified foot with unspecified severity: Secondary | ICD-10-CM | POA: Insufficient documentation

## 2012-02-14 DIAGNOSIS — I10 Essential (primary) hypertension: Secondary | ICD-10-CM | POA: Insufficient documentation

## 2012-02-14 DIAGNOSIS — K219 Gastro-esophageal reflux disease without esophagitis: Secondary | ICD-10-CM | POA: Insufficient documentation

## 2012-02-14 DIAGNOSIS — I2510 Atherosclerotic heart disease of native coronary artery without angina pectoris: Secondary | ICD-10-CM | POA: Insufficient documentation

## 2012-02-14 DIAGNOSIS — E785 Hyperlipidemia, unspecified: Secondary | ICD-10-CM | POA: Insufficient documentation

## 2012-02-14 DIAGNOSIS — I252 Old myocardial infarction: Secondary | ICD-10-CM | POA: Insufficient documentation

## 2012-02-14 DIAGNOSIS — Z87898 Personal history of other specified conditions: Secondary | ICD-10-CM | POA: Insufficient documentation

## 2012-02-14 DIAGNOSIS — Z79899 Other long term (current) drug therapy: Secondary | ICD-10-CM | POA: Insufficient documentation

## 2012-02-14 DIAGNOSIS — M109 Gout, unspecified: Secondary | ICD-10-CM | POA: Insufficient documentation

## 2012-02-14 DIAGNOSIS — G609 Hereditary and idiopathic neuropathy, unspecified: Secondary | ICD-10-CM | POA: Insufficient documentation

## 2012-02-14 DIAGNOSIS — Z7982 Long term (current) use of aspirin: Secondary | ICD-10-CM | POA: Insufficient documentation

## 2012-02-14 DIAGNOSIS — I2589 Other forms of chronic ischemic heart disease: Secondary | ICD-10-CM | POA: Insufficient documentation

## 2012-02-15 NOTE — Progress Notes (Signed)
[facility]  NAME:  Timothy [name], Timothy [name] NO.:  [id number]  MEDICAL RECORD NO.:  [id number]      DATE OF BIRTH:  1928/10/28  PHYSICIAN:  Wayland Denis, DO       VISIT DATE:  02/14/2012                                  OFFICE VISIT   CHIEF COMPLAINT:  Left lower extremity foot ulcer.  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  The patient states that he has neuropathy and did not feel callus forming before he knew that he ended up with ulceration and has been under treatment by Dr. Jodi Geralds for the ulcer.  He has undergone some debridement and it does not seem to be getting any better.  He has been doing wet-to-dry dressing changes, and has not seen improvement.  PAST MEDICAL HISTORY:  Atherosclerotic cardiovascular disease, anemia, gout, hypertension, coronary artery disease, lumbar disk disease, chronic back pain, heart murmur, ischemic cardiomyopathy, renal insufficiency, peripheral neuropathy, inferior MI, hyperlipidemia, gastroesophageal reflux, chest pain, history of multiple myeloma and peripheral neuropathy.  PAST SURGICAL HISTORY:  Laminectomy, inguinal hernia repair, tonsillectomy, coronary artery bypass graft, and cardiac catheterization.  SOCIAL HISTORY:  The patient is married, lives at home, quit smoking over 20 years ago.  He has never used smokeless tobacco and he does not drink alcohol.  No known drug allergies.  MEDICATIONS:  Include allopurinol, aspirin, CoQ10, Coreg, Dilaudid, fish oil, hydrochlorothiazide, Lipitor, multivitamin, Neurontin, Valtrex, vitamin B, Flomax, Celexa, and fludrocortisone.  ALLERGIES:  MORPHINE.  REVIEW OF SYSTEMS:  As above, otherwise negative at present.  Labs were reviewed.  Hemoglobin was low.  White count was within normal limits.  We did not have a prealbumin and platelets were 125.  CT exam did not  indicate osteomyelitis.  PHYSICAL EXAMINATION:  GENERAL:  The patient is alert and oriented, cooperative, not in any acute distress.  He is very pleasant and seems to be a good historian. HEENT:  His pupils are equal.  Extraocular muscles are intact.  No cervical lymphadenopathy.  His breathing is unlabored. HEART:  Regular. ABDOMEN:  Soft.  He has dry skin throughout typical for this season. EXTREMITIES:  His pulse is palpable in the lower extremities and upper extremities.  The left lower extremity, great toe, medial aspect, has significant wound with tendon and bone exposed.  This was debrided and those notes are in the chart.  Unfortunately, he had quite a bit of bone exposed both of the distal aspect of the metatarsal as well as the sesamoid bone with joint exposure, this is very concerning with surrounding erythema and swelling.  He lost integrity of medial deviation of his great toe. The cortex of the bone seems to be intact, but with the joint exposed, this is of great concern.  Recommend wet-to-moist, present with a little bit of Hydrogel, keep it from drying out.  I put a call into Dr. Luiz Blare to discuss this case and recommend at this point amputation.  I think that salvage would be very risky for worsening infection and possible more proximal amputation and I will wait to hear back from Dr. Luiz Blare. We stand by to assist in any wound needs after  amputation is performed.     Wayland Denis, DO     CS/MEDQ  D:  02/14/2012  T:  02/15/2012  Job:  [id number]

## 2012-02-20 ENCOUNTER — Ambulatory Visit: Payer: Medicare Other | Admitting: Physician Assistant

## 2012-02-20 ENCOUNTER — Other Ambulatory Visit: Payer: Medicare Other | Admitting: Lab

## 2012-02-21 ENCOUNTER — Other Ambulatory Visit: Payer: Medicare Other | Admitting: Lab

## 2012-02-21 ENCOUNTER — Other Ambulatory Visit: Payer: Self-pay | Admitting: *Deleted

## 2012-02-21 ENCOUNTER — Ambulatory Visit: Payer: Medicare Other | Admitting: Physician Assistant

## 2012-02-21 ENCOUNTER — Ambulatory Visit: Payer: Medicare Other

## 2012-02-21 ENCOUNTER — Encounter (HOSPITAL_BASED_OUTPATIENT_CLINIC_OR_DEPARTMENT_OTHER): Payer: Medicare Other | Attending: Plastic Surgery

## 2012-02-21 DIAGNOSIS — G5790 Unspecified mononeuropathy of unspecified lower limb: Secondary | ICD-10-CM | POA: Insufficient documentation

## 2012-02-21 DIAGNOSIS — L97809 Non-pressure chronic ulcer of other part of unspecified lower leg with unspecified severity: Secondary | ICD-10-CM | POA: Insufficient documentation

## 2012-02-22 NOTE — Progress Notes (Signed)
[facility]  NAME:  Timothy, [name] NO.:  [id number]  MEDICAL RECORD NO.:  [id number]      DATE OF BIRTH:  08-Mar-1928  PHYSICIAN:  Wayland Denis, DO            VISIT DATE:                                  OFFICE VISIT   Timothy [name] is an 78 year old gentleman who is here for his left lower extremity ulcer secondary to neuropathy.  He has been using Santyl over the past week.  There is no huge change.  The surrounding peripheral wound area actually looks slightly less irritated.  He has severe lateral rotation of his great toe secondary to loss of the tendon with the first metatarsal head exposed as well as the sesamoid bone.  I did show him what we were dealing with.  He is waiting for his foot doctor to return from vacation and has agreed to surgery.  There has been no change in his other medications or social history.  His wife is with him and seems to be very involved in his care.  PHYSICAL EXAMINATION:  GENERAL:  He is alert, oriented, cooperative, very pleasant. HEENT:  Pupils are equal.  Extraocular muscles are intact. NECK:  No cervical lymphadenopathy. CHEST:  His breathing is unlabored. HEART:  Regular. ABDOMEN:  Soft.  The wound is as described above and noted in the note.  I am going to switch him to Dakin's just to keep this section under control while we wait for the surgery, and we will have him follow up after the surgery but next week for sure.     Wayland Denis, DO     CS/MEDQ  D:  02/21/2012  T:  02/22/2012  Job:  [id number]

## 2012-02-26 ENCOUNTER — Inpatient Hospital Stay (HOSPITAL_COMMUNITY): Admission: RE | Admit: 2012-02-26 | Payer: Medicare Other | Source: Ambulatory Visit

## 2012-02-28 ENCOUNTER — Other Ambulatory Visit: Payer: Medicare Other | Admitting: Lab

## 2012-02-28 ENCOUNTER — Ambulatory Visit: Payer: Medicare Other

## 2012-02-28 ENCOUNTER — Ambulatory Visit: Payer: Medicare Other | Admitting: Oncology

## 2012-02-28 ENCOUNTER — Telehealth: Payer: Self-pay | Admitting: *Deleted

## 2012-02-28 NOTE — Telephone Encounter (Signed)
patient's wife called in to change patient appointment to 03-05-2012 patient is having surgery on 03-07-2012

## 2012-02-29 ENCOUNTER — Other Ambulatory Visit: Payer: Self-pay | Admitting: Orthopedic Surgery

## 2012-03-01 ENCOUNTER — Encounter (HOSPITAL_COMMUNITY): Payer: Self-pay | Admitting: Pharmacy Technician

## 2012-03-01 ENCOUNTER — Ambulatory Visit (HOSPITAL_COMMUNITY)
Admission: RE | Admit: 2012-03-01 | Discharge: 2012-03-01 | Disposition: A | Discharge status: Home / Self Care | Payer: Medicare Other | Source: Ambulatory Visit | Attending: Oncology | Admitting: Oncology

## 2012-03-01 ENCOUNTER — Encounter (HOSPITAL_COMMUNITY)
Admission: RE | Admit: 2012-03-01 | Discharge: 2012-03-01 | Disposition: A | Discharge status: Home / Self Care | Payer: Medicare Other | Source: Ambulatory Visit | Attending: Orthopedic Surgery | Admitting: Orthopedic Surgery

## 2012-03-01 ENCOUNTER — Encounter (HOSPITAL_COMMUNITY): Payer: Self-pay

## 2012-03-01 DIAGNOSIS — Z01810 Encounter for preprocedural cardiovascular examination: Secondary | ICD-10-CM | POA: Insufficient documentation

## 2012-03-01 DIAGNOSIS — Z951 Presence of aortocoronary bypass graft: Secondary | ICD-10-CM | POA: Insufficient documentation

## 2012-03-01 DIAGNOSIS — C9000 Multiple myeloma not having achieved remission: Secondary | ICD-10-CM | POA: Insufficient documentation

## 2012-03-01 DIAGNOSIS — Z01812 Encounter for preprocedural laboratory examination: Secondary | ICD-10-CM | POA: Insufficient documentation

## 2012-03-01 DIAGNOSIS — Z01818 Encounter for other preprocedural examination: Secondary | ICD-10-CM | POA: Insufficient documentation

## 2012-03-01 HISTORY — DX: Personal history of other infectious and parasitic diseases: Z86.19

## 2012-03-01 HISTORY — DX: Encounter for other specified aftercare: Z51.89

## 2012-03-01 HISTORY — DX: Reserved for inherently not codable concepts without codable children: IMO0001

## 2012-03-01 LAB — URINALYSIS, ROUTINE W REFLEX MICROSCOPIC
Glucose, UA: NEGATIVE mg/dL
Hgb urine dipstick: NEGATIVE
Ketones, ur: NEGATIVE mg/dL
Leukocytes, UA: NEGATIVE
pH: 6.5 (ref 5.0–8.0)

## 2012-03-01 LAB — APTT: aPTT: 55 seconds — ABNORMAL HIGH (ref 24–37)

## 2012-03-01 LAB — CBC
HCT: 29.1 % — ABNORMAL LOW (ref 39.0–52.0)
Hemoglobin: 9.8 g/dL — ABNORMAL LOW (ref 13.0–17.0)
MCV: 96.4 fL (ref 78.0–100.0)
RBC: 3.02 MIL/uL — ABNORMAL LOW (ref 4.22–5.81)
RDW: 15.6 % — ABNORMAL HIGH (ref 11.5–15.5)
WBC: 5 10*3/uL (ref 4.0–10.5)

## 2012-03-01 LAB — DIFFERENTIAL
Basophils Absolute: 0 10*3/uL (ref 0.0–0.1)
Basophils Relative: 0 % (ref 0–1)
Eosinophils Absolute: 0.1 10*3/uL (ref 0.0–0.7)
Monocytes Relative: 14 % — ABNORMAL HIGH (ref 3–12)
Neutrophils Relative %: 52 % (ref 43–77)

## 2012-03-01 LAB — PROTIME-INR: INR: 1.1 (ref 0.00–1.49)

## 2012-03-01 LAB — COMPREHENSIVE METABOLIC PANEL
Albumin: 3.1 g/dL — ABNORMAL LOW (ref 3.5–5.2)
Alkaline Phosphatase: 59 U/L (ref 39–117)
BUN: 14 mg/dL (ref 6–23)
CO2: 27 mEq/L (ref 19–32)
Chloride: 104 mEq/L (ref 96–112)
Creatinine, Ser: 0.89 mg/dL (ref 0.50–1.35)
GFR calc non Af Amer: 78 mL/min — ABNORMAL LOW (ref >90)
Glucose, Bld: 102 mg/dL — ABNORMAL HIGH (ref 70–99)
Potassium: 3.1 mEq/L — ABNORMAL LOW (ref 3.5–5.1)
Total Bilirubin: 0.6 mg/dL (ref 0.3–1.2)

## 2012-03-01 LAB — URINE MICROSCOPIC-ADD ON

## 2012-03-01 NOTE — Pre-Procedure Instructions (Signed)
CBC/DIFF, CMET, PT, PTT, UA, T/S, EKG WERE DONE TODAY PREOP AT [facility].  OLD EKG [facility]-ED 2008 ON PT'S CHART AND NUCLEAR STRESS TEST REPORT 04/25/11 FROM [facility] ON PT'S CHART. PT HAS CXR REPORT IN EPIC -04/20/11--CXR NOT REPEATED TODAY PREOP SINCE REPORT WITH IN A YEAR.

## 2012-03-01 NOTE — Patient Instructions (Signed)
YOUR SURGERY IS SCHEDULED ON:  WED  4/17  AT 1:00 PM  REPORT TO [place] [facility] AT:  11:00 AM      PHONE # FOR SHORT STAY IS [phone]  DO NOT EAT ANYTHING AFTER MIDNIGHT THE NIGHT BEFORE YOUR SURGERY.  YOU MAY BRUSH YOUR TEETH -NO FOOD, NO CHEWING GUM, NO MINTS, NO CANDIES, NO CHEWING TOBACCO.  MAY HAVE CLEAR LIQUIDS TO DRINK FROM MIDNIGHT UNTIL 7:00 AM THE DAY OF YOUR SURGERY--LIKE WATER OR TEA.  NOTHING TO DRINK AFTER 7AM PLEASE TAKE THE FOLLOWING MEDICATIONS THE AM OF YOUR SURGERY WITH A FEW SIPS OF WATER:  CARVEDILOL, FLUDROCORTISONE ( FLORINEF), HYDROMORPHONE ( DILAUDID )  IF NEEDED FOR PAIN, ALLOPURINOL    IF YOU USE INHALERS--USE YOUR INHALERS THE AM OF YOUR SURGERY AND BRING INHALERS TO THE HOSPITAL -TAKE TO SURGERY.    IF YOU ARE DIABETIC:  DO NOT TAKE ANY DIABETIC MEDICATIONS THE AM OF YOUR SURGERY.  IF YOU TAKE INSULIN IN THE EVENINGS--PLEASE ONLY TAKE 1/2 NORMAL EVENING DOSE THE NIGHT BEFORE YOUR SURGERY.  NO INSULIN THE AM OF YOUR SURGERY.  IF YOU HAVE SLEEP APNEA AND USE CPAP OR BIPAP--PLEASE BRING THE MASK --NOT THE MACHINE-NOT THE TUBING   -JUST THE MASK. DO NOT BRING VALUABLES, MONEY, CREDIT CARDS.  CONTACT LENS, DENTURES / PARTIALS, GLASSES SHOULD NOT BE WORN TO SURGERY AND IN MOST CASES-HEARING AIDS WILL NEED TO BE REMOVED.  BRING YOUR GLASSES CASE, ANY EQUIPMENT NEEDED FOR YOUR CONTACT LENS. FOR PATIENTS ADMITTED TO THE HOSPITAL--CHECK OUT TIME THE DAY OF DISCHARGE IS 11:00 AM.  ALL INPATIENT ROOMS ARE PRIVATE - WITH BATHROOM, TELEPHONE, TELEVISION AND WIFI INTERNET. IF YOU ARE BEING DISCHARGED THE SAME DAY OF YOUR SURGERY--YOU CAN NOT DRIVE YOURSELF HOME--AND SHOULD NOT GO HOME ALONE BY TAXI OR BUS.  NO DRIVING OR OPERATING MACHINERY FOR 24 HOURS FOLLOWING ANESTHESIA / PAIN MEDICATIONS.                            SPECIAL INSTRUCTIONS:  CHLORHEXIDINE SOAP SHOWER (other brand names are Betasept and Hibiclens ) PLEASE SHOWER WITH CHLORHEXIDINE THE NIGHT  BEFORE YOUR SURGERY AND THE AM OF YOUR SURGERY. DO NOT USE CHLORHEXIDINE ON YOUR FACE OR PRIVATE AREAS--YOU MAY USE YOUR NORMAL SOAP THOSE AREAS AND YOUR NORMAL SHAMPOO.  WOMEN SHOULD AVOID SHAVING UNDER ARMS AND SHAVING LEGS 48 HOURS BEFORE USING CHLORHEXIDINE TO AVOID SKIN IRRITATION.  DO NOT USE IF ALLERGIC TO CHLORHEXIDINE.  PLEASE READ OVER ANY  FACT SHEETS THAT YOU WERE GIVEN: MRSA INFORMATION, BLOOD TRANSFUSION INFORMATION

## 2012-03-04 ENCOUNTER — Telehealth: Payer: Self-pay

## 2012-03-04 ENCOUNTER — Other Ambulatory Visit: Payer: Self-pay

## 2012-03-04 ENCOUNTER — Telehealth: Payer: Self-pay | Admitting: *Deleted

## 2012-03-04 NOTE — Telephone Encounter (Signed)
Received call from pt's wife stating that pt is scheduled for lab and aranesp injection tomorrow.  She states pt just had labs done 03/01/12 pre-op for surgery to remove part of pt's foot, by Dr. Luiz Blare, scheduled for Wednesday.  She states pt will be at [facility] for a couple of days.  She wants to know does pt need to come in tomorrow for this lab and injection?  Informed her will leave note for Dr. Darnelle Catalan to review, and office will call her back.

## 2012-03-04 NOTE — Telephone Encounter (Signed)
Called and spoke with pt's wife to inform her per Dr. Darnelle Catalan, pt can cancel lab and injection tomorrow, as he will take care of this while pt is in hospital.  She verbalizes understanding.  Jacki Cones, scheduling, notified.

## 2012-03-04 NOTE — Pre-Procedure Instructions (Signed)
MESSAGE LEFT ON VOICE MAIL FOR Timothy [name] -SURGERY SCHEDULER FOR DR. Eloisa Northern Barnie Cipollone HAS ABNORMAL PREOP LABS IN EPIC THAT DR. Luiz Blare NEEDS TO REVIEW-HGB OF 9.8, HCT OF 29.1, RBC'S 3.02, POTASSIUM 3.1 AND PTT 55.  Timothy CALLED BACK-STATES SHE LET DR. Luiz Blare KNOW TO REVIEW PT'S ABNORMAL LABS IN EPIC

## 2012-03-04 NOTE — Telephone Encounter (Signed)
per orders from 03-04-2012 cancel patient appointment for 03-05-2012

## 2012-03-05 ENCOUNTER — Other Ambulatory Visit: Payer: Medicare Other

## 2012-03-05 ENCOUNTER — Ambulatory Visit: Payer: Medicare Other

## 2012-03-06 ENCOUNTER — Inpatient Hospital Stay (HOSPITAL_COMMUNITY)
Admission: RE | Admit: 2012-03-06 | Discharge: 2012-03-08 | DRG: 504 | Disposition: A | Discharge status: Home Health | Payer: Medicare Other | Source: Ambulatory Visit | Attending: Orthopedic Surgery | Admitting: Orthopedic Surgery

## 2012-03-06 ENCOUNTER — Encounter (HOSPITAL_COMMUNITY)
Admission: RE | Disposition: A | Discharge status: Home Health | Payer: Self-pay | Source: Ambulatory Visit | Attending: Orthopedic Surgery

## 2012-03-06 ENCOUNTER — Encounter (HOSPITAL_COMMUNITY): Payer: Self-pay | Admitting: *Deleted

## 2012-03-06 ENCOUNTER — Ambulatory Visit (HOSPITAL_COMMUNITY): Payer: Medicare Other | Admitting: *Deleted

## 2012-03-06 DIAGNOSIS — I252 Old myocardial infarction: Secondary | ICD-10-CM

## 2012-03-06 DIAGNOSIS — I10 Essential (primary) hypertension: Secondary | ICD-10-CM

## 2012-03-06 DIAGNOSIS — S91309A Unspecified open wound, unspecified foot, initial encounter: Secondary | ICD-10-CM | POA: Diagnosis present

## 2012-03-06 DIAGNOSIS — I2510 Atherosclerotic heart disease of native coronary artery without angina pectoris: Secondary | ICD-10-CM | POA: Diagnosis present

## 2012-03-06 DIAGNOSIS — C9000 Multiple myeloma not having achieved remission: Secondary | ICD-10-CM

## 2012-03-06 DIAGNOSIS — M869 Osteomyelitis, unspecified: Principal | ICD-10-CM | POA: Diagnosis present

## 2012-03-06 DIAGNOSIS — S91302A Unspecified open wound, left foot, initial encounter: Secondary | ICD-10-CM | POA: Diagnosis present

## 2012-03-06 DIAGNOSIS — Z951 Presence of aortocoronary bypass graft: Secondary | ICD-10-CM

## 2012-03-06 HISTORY — PX: AMPUTATION: SHX166

## 2012-03-06 LAB — SEDIMENTATION RATE: Sed Rate: 27 mm/hr — ABNORMAL HIGH (ref 0–16)

## 2012-03-06 SURGERY — AMPUTATION, FOOT, RAY
Anesthesia: General | Laterality: Left | Wound class: Dirty or Infected

## 2012-03-06 MED ORDER — ZOLPIDEM TARTRATE 5 MG PO TABS
5.0000 mg | ORAL_TABLET | Freq: Every evening | ORAL | Status: DC | PRN
  Administered 2012-03-06 – 2012-03-07 (×2): 5 mg via ORAL
  Filled 2012-03-06 (×2): qty 1

## 2012-03-06 MED ORDER — ASPIRIN EC 81 MG PO TBEC
81.0000 mg | DELAYED_RELEASE_TABLET | ORAL | Status: DC
  Administered 2012-03-06 – 2012-03-08 (×2): 81 mg via ORAL
  Filled 2012-03-06 (×3): qty 1

## 2012-03-06 MED ORDER — ONDANSETRON HCL 4 MG PO TABS: 4.0000 mg | ORAL_TABLET | Freq: Four times a day (QID) | ORAL | Status: DC | PRN

## 2012-03-06 MED ORDER — TAMSULOSIN HCL 0.4 MG PO CAPS
0.4000 mg | ORAL_CAPSULE | Freq: Every day | ORAL | Status: DC
  Filled 2012-03-06: qty 1

## 2012-03-06 MED ORDER — LISINOPRIL 20 MG PO TABS
20.0000 mg | ORAL_TABLET | Freq: Every day | ORAL | Status: DC
  Administered 2012-03-06 – 2012-03-07 (×2): 20 mg via ORAL
  Filled 2012-03-06 (×3): qty 1

## 2012-03-06 MED ORDER — ATORVASTATIN CALCIUM 20 MG PO TABS
20.0000 mg | ORAL_TABLET | Freq: Every day | ORAL | Status: DC
  Administered 2012-03-06 – 2012-03-07 (×2): 20 mg via ORAL
  Filled 2012-03-06 (×3): qty 1

## 2012-03-06 MED ORDER — DEXAMETHASONE SODIUM PHOSPHATE 4 MG/ML IJ SOLN
INTRAMUSCULAR | Status: DC | PRN
  Administered 2012-03-06: 10 mg via INTRAVENOUS

## 2012-03-06 MED ORDER — FENTANYL CITRATE 0.05 MG/ML IJ SOLN: 25.0000 ug | INTRAMUSCULAR | Status: DC | PRN

## 2012-03-06 MED ORDER — CARVEDILOL 3.125 MG PO TABS
3.1250 mg | ORAL_TABLET | Freq: Two times a day (BID) | ORAL | Status: DC
  Administered 2012-03-06 – 2012-03-08 (×4): 3.125 mg via ORAL
  Filled 2012-03-06 (×5): qty 1

## 2012-03-06 MED ORDER — PROPOFOL 10 MG/ML IV EMUL
INTRAVENOUS | Status: DC | PRN
  Administered 2012-03-06: 150 mg via INTRAVENOUS

## 2012-03-06 MED ORDER — ACETAMINOPHEN 10 MG/ML IV SOLN
INTRAVENOUS | Status: AC
  Filled 2012-03-06: qty 100

## 2012-03-06 MED ORDER — TAMSULOSIN HCL 0.4 MG PO CAPS
0.4000 mg | ORAL_CAPSULE | Freq: Every day | ORAL | Status: DC
  Administered 2012-03-06 – 2012-03-07 (×2): 0.4 mg via ORAL
  Filled 2012-03-06 (×3): qty 1

## 2012-03-06 MED ORDER — VALACYCLOVIR HCL 500 MG PO TABS
500.0000 mg | ORAL_TABLET | Freq: Every day | ORAL | Status: DC
  Administered 2012-03-06 – 2012-03-08 (×3): 500 mg via ORAL
  Filled 2012-03-06 (×3): qty 1

## 2012-03-06 MED ORDER — CEFAZOLIN SODIUM-DEXTROSE 2-3 GM-% IV SOLR
INTRAVENOUS | Status: AC
  Filled 2012-03-06: qty 50

## 2012-03-06 MED ORDER — LISINOPRIL 20 MG PO TABS: 20.0000 mg | ORAL_TABLET | Freq: Every day | ORAL | Status: DC

## 2012-03-06 MED ORDER — ASPIRIN EC 81 MG PO TBEC
81.0000 mg | DELAYED_RELEASE_TABLET | ORAL | Status: DC
  Filled 2012-03-06: qty 1

## 2012-03-06 MED ORDER — SODIUM CHLORIDE 0.9 % IV SOLN
INTRAVENOUS | Status: DC
  Administered 2012-03-06: 19:00:00 via INTRAVENOUS

## 2012-03-06 MED ORDER — ALLOPURINOL 150 MG HALF TABLET
150.0000 mg | ORAL_TABLET | Freq: Every day | ORAL | Status: DC
  Administered 2012-03-06 – 2012-03-08 (×3): 150 mg via ORAL
  Filled 2012-03-06 (×3): qty 1

## 2012-03-06 MED ORDER — CEFAZOLIN SODIUM-DEXTROSE 2-3 GM-% IV SOLR
2.0000 g | INTRAVENOUS | Status: AC
  Administered 2012-03-06: 2 g via INTRAVENOUS

## 2012-03-06 MED ORDER — CITALOPRAM HYDROBROMIDE 10 MG PO TABS
10.0000 mg | ORAL_TABLET | Freq: Every day | ORAL | Status: DC
  Administered 2012-03-06 – 2012-03-07 (×2): 10 mg via ORAL
  Filled 2012-03-06 (×3): qty 1

## 2012-03-06 MED ORDER — 0.9 % SODIUM CHLORIDE (POUR BTL) OPTIME
TOPICAL | Status: DC | PRN
  Administered 2012-03-06: 1000 mL

## 2012-03-06 MED ORDER — SODIUM CHLORIDE 0.9 % IR SOLN
Status: DC | PRN
  Administered 2012-03-06: 3000 mL

## 2012-03-06 MED ORDER — ASPIRIN EC 81 MG PO TBEC: 81.0000 mg | DELAYED_RELEASE_TABLET | ORAL | Status: DC

## 2012-03-06 MED ORDER — FENTANYL CITRATE 0.05 MG/ML IJ SOLN
INTRAMUSCULAR | Status: DC | PRN
  Administered 2012-03-06: 50 ug via INTRAVENOUS

## 2012-03-06 MED ORDER — ATORVASTATIN CALCIUM 20 MG PO TABS: 20.0000 mg | ORAL_TABLET | Freq: Every day | ORAL | Status: DC

## 2012-03-06 MED ORDER — DOCUSATE SODIUM 100 MG PO CAPS
100.0000 mg | ORAL_CAPSULE | Freq: Two times a day (BID) | ORAL | Status: DC
  Administered 2012-03-06 – 2012-03-08 (×4): 100 mg via ORAL
  Filled 2012-03-06 (×5): qty 1

## 2012-03-06 MED ORDER — EPHEDRINE SULFATE 50 MG/ML IJ SOLN
INTRAMUSCULAR | Status: DC | PRN
  Administered 2012-03-06 (×3): 10 mg via INTRAVENOUS
  Administered 2012-03-06: 7.5 mg via INTRAVENOUS

## 2012-03-06 MED ORDER — METHOCARBAMOL 100 MG/ML IJ SOLN
500.0000 mg | Freq: Four times a day (QID) | INTRAVENOUS | Status: DC | PRN
  Filled 2012-03-06: qty 5

## 2012-03-06 MED ORDER — ONDANSETRON HCL 4 MG/2ML IJ SOLN: 4.0000 mg | Freq: Four times a day (QID) | INTRAMUSCULAR | Status: DC | PRN

## 2012-03-06 MED ORDER — LACTATED RINGERS IV SOLN: INTRAVENOUS | Status: DC

## 2012-03-06 MED ORDER — MIDAZOLAM HCL 5 MG/5ML IJ SOLN
INTRAMUSCULAR | Status: DC | PRN
  Administered 2012-03-06: 2 mg via INTRAVENOUS

## 2012-03-06 MED ORDER — TAMSULOSIN HCL 0.4 MG PO CAPS: 0.4000 mg | ORAL_CAPSULE | Freq: Every day | ORAL | Status: DC

## 2012-03-06 MED ORDER — HYDROMORPHONE HCL PF 1 MG/ML IJ SOLN
0.2500 mg | INTRAMUSCULAR | Status: DC | PRN
  Administered 2012-03-06 (×3): 0.5 mg via INTRAVENOUS

## 2012-03-06 MED ORDER — HYDROMORPHONE HCL PF 1 MG/ML IJ SOLN
INTRAMUSCULAR | Status: AC
  Administered 2012-03-06: 15:00:00
  Filled 2012-03-06: qty 1

## 2012-03-06 MED ORDER — POVIDONE-IODINE 7.5 % EX SOLN
Freq: Once | CUTANEOUS | Status: DC
  Administered 2012-03-06: 23:00:00 via TOPICAL

## 2012-03-06 MED ORDER — ONDANSETRON HCL 4 MG/2ML IJ SOLN
INTRAMUSCULAR | Status: DC | PRN
  Administered 2012-03-06: 4 mg via INTRAVENOUS

## 2012-03-06 MED ORDER — VANCOMYCIN HCL IN DEXTROSE 1-5 GM/200ML-% IV SOLN
1000.0000 mg | Freq: Two times a day (BID) | INTRAVENOUS | Status: DC
  Administered 2012-03-06 – 2012-03-08 (×4): 1000 mg via INTRAVENOUS
  Filled 2012-03-06 (×6): qty 200

## 2012-03-06 MED ORDER — METHOCARBAMOL 500 MG PO TABS: 500.0000 mg | ORAL_TABLET | Freq: Four times a day (QID) | ORAL | Status: DC | PRN

## 2012-03-06 MED ORDER — LIDOCAINE HCL (CARDIAC) 20 MG/ML IV SOLN
INTRAVENOUS | Status: DC | PRN
  Administered 2012-03-06: 100 mg via INTRAVENOUS

## 2012-03-06 MED ORDER — LACTATED RINGERS IV SOLN
INTRAVENOUS | Status: DC | PRN
  Administered 2012-03-06: 13:00:00 via INTRAVENOUS

## 2012-03-06 MED ORDER — GABAPENTIN 300 MG PO CAPS
600.0000 mg | ORAL_CAPSULE | Freq: Every day | ORAL | Status: DC
  Administered 2012-03-06 – 2012-03-07 (×2): 600 mg via ORAL
  Filled 2012-03-06 (×3): qty 2

## 2012-03-06 MED ORDER — HYDROMORPHONE HCL PF 1 MG/ML IJ SOLN
0.5000 mg | INTRAMUSCULAR | Status: DC | PRN
  Administered 2012-03-07 – 2012-03-08 (×7): 1 mg via INTRAVENOUS
  Filled 2012-03-06 (×8): qty 1

## 2012-03-06 MED ORDER — HYDROMORPHONE HCL 4 MG PO TABS
4.0000 mg | ORAL_TABLET | ORAL | Status: DC | PRN
  Administered 2012-03-08: 4 mg via ORAL
  Filled 2012-03-06: qty 1

## 2012-03-06 MED ORDER — HYDROCHLOROTHIAZIDE 50 MG PO TABS
50.0000 mg | ORAL_TABLET | Freq: Every day | ORAL | Status: DC | PRN
  Filled 2012-03-06: qty 1

## 2012-03-06 MED ORDER — ACETAMINOPHEN 10 MG/ML IV SOLN
INTRAVENOUS | Status: DC | PRN
  Administered 2012-03-06: 1000 mg via INTRAVENOUS

## 2012-03-06 MED ORDER — VITAMIN C 500 MG PO TABS
500.0000 mg | ORAL_TABLET | Freq: Every day | ORAL | Status: DC
  Administered 2012-03-06 – 2012-03-08 (×3): 500 mg via ORAL
  Filled 2012-03-06 (×3): qty 1

## 2012-03-06 MED ORDER — ASPIRIN EC 81 MG PO TBEC
81.0000 mg | DELAYED_RELEASE_TABLET | Freq: Every day | ORAL | Status: DC
  Filled 2012-03-06: qty 1

## 2012-03-06 MED ORDER — DEXTROSE 5 % IV SOLN
1.0000 g | Freq: Two times a day (BID) | INTRAVENOUS | Status: DC
  Administered 2012-03-06 – 2012-03-08 (×4): 1 g via INTRAVENOUS
  Filled 2012-03-06 (×8): qty 1

## 2012-03-06 MED ORDER — FLUDROCORTISONE ACETATE 0.1 MG PO TABS
0.1000 mg | ORAL_TABLET | Freq: Every day | ORAL | Status: DC
  Filled 2012-03-06: qty 1

## 2012-03-06 MED ORDER — ATORVASTATIN CALCIUM 20 MG PO TABS: 20.0000 mg | ORAL_TABLET | Freq: Two times a day (BID) | ORAL | Status: DC

## 2012-03-06 SURGICAL SUPPLY — 31 items
BAG SPEC THK2 15X12 ZIP CLS (MISCELLANEOUS) ×1
BAG ZIPLOCK 12X15 (MISCELLANEOUS) ×2 IMPLANT
BANDAGE ELASTIC 4 VELCRO ST LF (GAUZE/BANDAGES/DRESSINGS) ×2 IMPLANT
BANDAGE ELASTIC 6 VELCRO ST LF (GAUZE/BANDAGES/DRESSINGS) ×2 IMPLANT
BANDAGE GAUZE ELAST BULKY 4 IN (GAUZE/BANDAGES/DRESSINGS) ×4 IMPLANT
BLADE MIC 41X13 (BLADE) ×2 IMPLANT
BLADE SURG SZ10 CARB STEEL (BLADE) ×2 IMPLANT
CLOTH BEACON ORANGE TIMEOUT ST (SAFETY) ×2 IMPLANT
CUFF TOURN SGL QUICK 18 (TOURNIQUET CUFF) ×2 IMPLANT
DRAPE SURG 17X11 SM STRL (DRAPES) IMPLANT
DRSG PAD ABDOMINAL 8X10 ST (GAUZE/BANDAGES/DRESSINGS) ×2 IMPLANT
ELECT REM PT RETURN 9FT ADLT (ELECTROSURGICAL) ×2
ELECTRODE REM PT RTRN 9FT ADLT (ELECTROSURGICAL) ×1 IMPLANT
GAUZE XEROFORM 5X9 LF (GAUZE/BANDAGES/DRESSINGS) ×2 IMPLANT
GLOVE BIO SURGEON STRL SZ8 (GLOVE) ×2 IMPLANT
GLOVE ECLIPSE 7.5 STRL STRAW (GLOVE) ×2 IMPLANT
HANDPIECE INTERPULSE COAX TIP (DISPOSABLE) ×1
KIT BASIN OR (CUSTOM PROCEDURE TRAY) ×2 IMPLANT
MANIFOLD NEPTUNE II (INSTRUMENTS) ×2 IMPLANT
NS IRRIG 1000ML POUR BTL (IV SOLUTION) ×2 IMPLANT
PACK LOWER EXTREMITY WL (CUSTOM PROCEDURE TRAY) ×2 IMPLANT
PAD CAST 4YDX4 CTTN HI CHSV (CAST SUPPLIES) ×1 IMPLANT
PADDING CAST COTTON 4X4 STRL (CAST SUPPLIES) ×1
POSITIONER SURGICAL ARM (MISCELLANEOUS) ×2 IMPLANT
SET HNDPC FAN SPRY TIP SCT (DISPOSABLE) ×1 IMPLANT
SOL PREP POV-IOD 16OZ 10% (MISCELLANEOUS) ×2 IMPLANT
SOL PREP PROV IODINE SCRUB 4OZ (MISCELLANEOUS) ×2 IMPLANT
SPONGE GAUZE 4X4 12PLY (GAUZE/BANDAGES/DRESSINGS) ×4 IMPLANT
SUT ETHILON 2 0 PS N (SUTURE) ×6 IMPLANT
SUT PROLENE 3 0 PS 2 (SUTURE) ×2 IMPLANT
WATER STERILE IRR 1500ML POUR (IV SOLUTION) ×2 IMPLANT

## 2012-03-06 NOTE — Preoperative (Signed)
Beta Blockers   Reason not to administer Beta Blockers:pt stated he took coreg this am 

## 2012-03-06 NOTE — Progress Notes (Signed)
Spoke c jim bethume, PA  re scrub. States do not do scrub here/ sandy in pre op notified [phone])

## 2012-03-06 NOTE — H&P (Signed)
PREOPERATIVE H&P  Chief Complaint: l. Foot open wound  HPI: Timothy [name] is a 78 y.o. male who presents for evaluation of l. Foot open wound. It has been present for 6 mon and has been worsening. He has failed conservative measures. Pain is rated as severe.  Past Medical History  Diagnosis Date  . ASCVD (arteriosclerotic cardiovascular disease)   . Anemia     chronic mild anemia  . Gout   . Hypertension   . Lumbar disc disease     post laminectomy  . Chronic back pain   . History of echocardiogram 11/02/2010    EF range of 30 to 35% / There is hypokinsesis of the basal-mild inferolateral myocardium  . CHF (congestive heart failure)   . Heart murmur   . Ischemic cardiomyopathy   . Renal insufficiency   . Peripheral neuropathy   . Inferior myocardial infarction 1986  . Hyperlipidemia   . SOB (shortness of breath)   . Fatigue   . Peripheral neuropathy   . Coronary artery disease     CABG 2002  . Blood transfusion     DEC 2012 - TWO UNITS  . 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  PT HAS BEEN OFF CHEMO SINCE HIS HOSPITALIZATION FEB 2013 FOR LEFT FOOT INFECTION  . Cancer   . History of shingles MARCH 2009    SHINGLE LESIONS WERE AROUND RIGHT EYE--PT HAS RESIDUAL ITCHING AROUND THE EYE.   Past Surgical History  Procedure Date  . Laminectomy   . Inguinal hernia repair   . Cardiac catheterization 06/20/2000    Severe coronary disease (totally occluded right artery, 90% left circumflex, 50-60% intermediate, and 90-95% ostial left anterior descending)   . Tonsillectomy   . Coronary artery bypass graft 2002    x4 / Left internal mammary artery to the LAD.  / Saphenous vein graft to the right posterolateral.  /  Saphenous vein graft to  the ramus intermedius. / Saphenous vein graft to the circumflex marginal      History   Social History  . Marital Status: Married    Spouse Name: N/A    Number of Children: N/A  .  Years of Education: N/A   Social History Main Topics  . Smoking status: Former Smoker -- 1.0 packs/day for 20 years    Types: Cigarettes    Quit date: 11/20/1984  . Smokeless tobacco: Never Used  . Alcohol Use: No  . Drug Use: No  . Sexually Active: Yes   Other Topics Concern  . None   Social History Narrative  . None   History reviewed. No pertinent family history. Allergies  Allergen Reactions  . Morphine And Related Other (See Comments)    Could not seat still   Prior to Admission medications   Medication Sig Start Date End Date Taking? Authorizing Provider  acetaminophen (TYLENOL) 500 MG tablet Take 500 mg by mouth every 4 (four) hours as needed. Pain    Yes Historical Provider, MD  allopurinol (ZYLOPRIM) 300 MG tablet Take 150 mg by mouth daily.    Yes Historical Provider, MD  aspirin 81 MG tablet Take 81 mg by mouth every Monday, Wednesday, and Friday.    Yes Historical Provider, MD  atorvastatin (LIPITOR) 40 MG tablet Take 20 mg by mouth 2 (two) times daily.  08/08/11  Yes Peter M [place], MD  B Complex-Biotin-FA (B COMPLETE PO) Take 0.5 tablets by mouth 2 (  two) times daily.    Yes Historical Provider, MD  carvedilol (COREG) 3.125 MG tablet Take 1 tablet (3.125 mg total) by mouth 2 (two) times daily with a meal. 08/08/11  Yes Peter M [place], MD  citalopram (CELEXA) 10 MG tablet Take 10 mg by mouth at bedtime. 01/31/12 01/30/13 Yes Amy Allegra Grana, PA  Coenzyme Q10 (CO Q-10 PO) Take 1 tablet by mouth daily.    Yes Historical Provider, MD  fludrocortisone (FLORINEF) 0.1 MG tablet Take 1 tablet (0.1 mg total) by mouth daily. 01/31/12 01/30/13 Yes Amy Allegra Grana, PA  gabapentin (NEURONTIN) 300 MG capsule Take 600 mg by mouth at bedtime.    Yes Historical Provider, MD  hydrochlorothiazide (HYDRODIURIL) 25 MG tablet Take 50 mg by mouth daily as needed. Fluid  10/25/11  Yes Historical Provider, MD  HYDROmorphone (DILAUDID) 4 MG tablet Take 1 tablet (4 mg total) by mouth every 6 (six) hours as  needed for pain. 01/15/12  Yes Standley Brooking, MD  multivitamin [facility]) per tablet Take 0.5 tablets by mouth 2 (two) times daily.    Yes Historical Provider, MD  Omega-3 Fatty Acids (FISH OIL PO) Take 1 capsule by mouth 2 (two) times daily.    Yes Historical Provider, MD  Tamsulosin HCl (FLOMAX) 0.4 MG CAPS Take 1 capsule (0.4 mg total) by mouth daily. 01/31/12  Yes Amy Allegra Grana, PA  valACYclovir (VALTREX) 500 MG tablet Take 500 mg by mouth daily. CONT.   Yes Historical Provider, MD  vitamin C (ASCORBIC ACID) 500 MG tablet Take 500 mg by mouth daily.   Yes Historical Provider, MD  Zinc 50 MG TABS Take 50 mg by mouth daily.   Yes Historical Provider, MD     Positive ROS: none  All other systems have been reviewed and were otherwise negative with the exception of those mentioned in the HPI and as above.  Physical Exam: Filed Vitals:   03/06/12 1042  BP: 169/90  Pulse: 69  Temp: 98.3 F (36.8 C)  Resp: 20    General: Alert, no acute distress Cardiovascular: No pedal edema Respiratory: No cyanosis, no use of accessory musculature GI: No organomegaly, abdomen is soft and non-tender Skin: No lesions in the area of chief complaint Neurologic: Sensation intact distally Psychiatric: Patient is competent for consent with normal mood and affect Lymphatic: No axillary or cervical lymphadenopathy  MUSCULOSKELETAL: L. Foot painful ROM open draining wound over l. 1st ray  Assessment/Plan: infection left foot with osteomyelitis of 1st metatarsal Plan for Procedure(s): AMPUTATION RAY  The risks benefits and alternatives were discussed with the patient including but not limited to the risks of nonoperative treatment, versus surgical intervention including infection, bleeding, nerve injury, malunion, nonunion, hardware prominence, hardware failure, need for hardware removal, blood clots, cardiopulmonary complications, morbidity, mortality, among others, and they were willing to proceed.   Predicted outcome is good, although there will be at least a six to nine month expected recovery.  Harvie Junior, MD 03/06/2012 1:13 PM

## 2012-03-06 NOTE — Consult Note (Signed)
Date of Admission:  03/06/2012  Date of Consult:  03/06/2012  Reason for Consult: Osteomyelitis Referring Physician: Graves   Impression/Recommendation Osteomyelitis L foot Would- start cefepime and vancomycin Await Cx. Agree that he will likely need long term anbx via a PIC line. Will change his previous quinolone to a cephalosporin given that he has been on this for some time. Hopefully Cx will be useful.   Timothy [name] is an 78 y.o. male.  HPI: 78 yo M with hx of Multiple Myeloma since 2007, has been treated but never completely in remission. States his last treatment was 6 weeks ago. Due to his therapy for his MM, he has developed neuropathy.  In late February he noted a callus on his L great toe. He used a pumice stone on this until it began to bleed. He later developed an ulcer here. This has progressively worsened. He has been seen at the wound care center but his wound has not improved. He underwent L 1st ray amputation today. He was noted in surgery to have proximal involvement of his foot as well.  He has had previous wound Cx (February 08, 2012) which grew Pseudomonas aeruginosa (pan-sensitive). He has been treated with levaquin as an outpatient.   Past Medical History  Diagnosis Date  . ASCVD (arteriosclerotic cardiovascular disease)   . Anemia     chronic mild anemia  . Gout   . Hypertension   . Lumbar disc disease     post laminectomy  . Chronic back pain   . History of echocardiogram 11/02/2010    EF range of 30 to 35% / There is hypokinsesis of the basal-mild inferolateral myocardium  . CHF (congestive heart failure)   . Heart murmur   . Ischemic cardiomyopathy   . Renal insufficiency   . Peripheral neuropathy   . Inferior myocardial infarction 1986  . Hyperlipidemia   . SOB (shortness of breath)   . Fatigue   . Peripheral neuropathy   . Coronary artery disease     CABG 2002  . Blood transfusion     DEC 2012 - TWO UNITS  . Myeloma     Multiple myeloma, with  recurrence of increasing problems  DR. MAGRINOT -ONCOLOGIST.  PT HAS BEEN OFF CHEMO SINCE HIS HOSPITALIZATION FEB 2013 FOR LEFT FOOT INFECTION  . Cancer   . History of shingles MARCH 2009    SHINGLE LESIONS WERE AROUND RIGHT EYE--PT HAS RESIDUAL ITCHING AROUND THE EYE.    Past Surgical History  Procedure Date  . Laminectomy   . Inguinal hernia repair   . Cardiac catheterization 06/20/2000    Severe coronary disease (totally occluded right artery, 90% left circumflex, 50-60% intermediate, and 90-95% ostial left anterior descending)   . Tonsillectomy   . Coronary artery bypass graft 2002    x4 / Left internal mammary artery to the LAD.  / Saphenous vein graft to the right posterolateral.  /  Saphenous vein graft to  the ramus intermedius. / Saphenous vein graft to the circumflex marginal     ergies:   Allergies  Allergen Reactions  . Morphine And Related Other (See Comments)    Could not seat still    Medications:  Scheduled:   .  ceFAZolin (ANCEF) IV  2 g Intravenous 60 min Pre-Op  . HYDROmorphone      . HYDROmorphone      . povidone-iodine   Topical Once    Social History:  reports that he quit smoking about 27  years ago. His smoking use included Cigarettes. He has a 20 pack-year smoking history. He has never used smokeless tobacco. He reports that he does drink alcohol rarely.  History reviewed. No pertinent family history.  General ROS: decreased appetite, normal bowel movements, normal urination, no fever or chills, no proximal erythema.   Blood pressure 164/70, pulse 69, temperature 97.5 F (36.4 C), resp. rate 17, SpO2 100.00%. General appearance: alert, cooperative and no distress Eyes: negative findings: pupils equal, round, reactive to light and accomodation Throat: normal findings: soft palate, uvula, and tonsils normal Lungs: clear to auscultation bilaterally Heart: regular rate and rhythm Abdomen: normal findings: bowel sounds normal and soft,  non-tender Extremities: LLE is wrapped in a clean dressing.     No results found for this or any previous visit (from the past 48 hour(s)). No results found for this basename: sdes, specrequest, cult, reptstatus   No results found.  Thank you so much for this interesting consult,   Johny Sax [phone] 03/06/2012, 4:22 PM     LOS: 0 days

## 2012-03-06 NOTE — Anesthesia Postprocedure Evaluation (Signed)
  Anesthesia Post-op Note  Patient: Timothy [name]  Procedure(s) Performed: Procedure(s) (LRB): AMPUTATION RAY (Left)  Patient Location: PACU  Anesthesia Type: General  Level of Consciousness: awake and alert   Airway and Oxygen Therapy: Patient Spontanous Breathing  Post-op Pain: mild  Post-op Assessment: Post-op Vital signs reviewed, Patient's Cardiovascular Status Stable, Respiratory Function Stable, Patent Airway and No signs of Nausea or vomiting  Post-op Vital Signs: stable  Complications: No apparent anesthesia complications

## 2012-03-06 NOTE — Transfer of Care (Signed)
Immediate Anesthesia Transfer of Care Note  Patient: Timothy [name]  Procedure(s) Performed: Procedure(s) (LRB): AMPUTATION RAY (Left)  Patient Location: PACU  Anesthesia Type: General  Level of Consciousness: awake, alert  and oriented  Airway & Oxygen Therapy: Patient Spontanous Breathing and Patient connected to face mask oxygen  Post-op Assessment: Report given to PACU RN and Post -op Vital signs reviewed and stable  Post vital signs: Reviewed and stable  Complications: No apparent anesthesia complications

## 2012-03-06 NOTE — Progress Notes (Signed)
ANTIBIOTIC CONSULT NOTE - INITIAL  Pharmacy Consult for Vancomycin Indication: Osteomyelitis  Allergies  Allergen Reactions  . Morphine And Related Other (See Comments)    Could not seat still    Patient Measurements: Height: 5\' 10"  (177.8 cm) (03/01/12 per BSA enry) Weight: 182 lb 15.7 oz (83 kg) (per BSA entry 03/01/12) IBW/kg (Calculated) : 73  Adjusted Body Weight: 77kg  Vital Signs: Temp: 97.5 F (36.4 C) (04/17 1439) BP: 147/75 mmHg (04/17 1700) Pulse Rate: 56  (04/17 1700) Intake/Output from previous day:   Intake/Output from this shift: Total I/O In: 950 [I.V.:950] Out: 400 [Urine:350; Blood:50]  Labs: No results found for this basename: WBC:3,HGB:3,PLT:3,LABCREA:3,CREATININE:3 in the last 72 hours Estimated Creatinine Clearance: 67.2 ml/min (by C-G formula based on Cr of 0.89). CrCl =  65 ml/min (normalized)  Microbiology: Recent Results (from the past 720 hour(s))  SURGICAL PCR SCREEN     Status: Normal   Collection Time   03/01/12  3:06 PM      Component Value Range Status Comment   MRSA, PCR NEGATIVE  NEGATIVE  Final    Staphylococcus aureus NEGATIVE  NEGATIVE  Final   4/17 wound cx pending  Medical History: Past Medical History  Diagnosis Date  . ASCVD (arteriosclerotic cardiovascular disease)   . Anemia     chronic mild anemia  . Gout   . Hypertension   . Lumbar disc disease     post laminectomy  . Chronic back pain   . History of echocardiogram 11/02/2010    EF range of 30 to 35% / There is hypokinsesis of the basal-mild inferolateral myocardium  . CHF (congestive heart failure)   . Heart murmur   . Ischemic cardiomyopathy   . Renal insufficiency   . Peripheral neuropathy   . Inferior myocardial infarction 1986  . Hyperlipidemia   . SOB (shortness of breath)   . Fatigue   . Peripheral neuropathy   . Coronary artery disease     CABG 2002  . Blood transfusion     DEC 2012 - TWO UNITS  . Myeloma     Multiple myeloma, with recurrence of  increasing problems  DR. MAGRINOT -ONCOLOGIST.  PT HAS BEEN OFF CHEMO SINCE HIS HOSPITALIZATION FEB 2013 FOR LEFT FOOT INFECTION  . Cancer   . History of shingles MARCH 2009    SHINGLE LESIONS WERE AROUND RIGHT EYE--PT HAS RESIDUAL ITCHING AROUND THE EYE.    Medications:   Assessment: 78 yo Male with Osteomyelitis (on longterm levaquin for previous wound culture +Pseudomonas) started on Vancomycin/Cefepime s/p toe amputation 4/17.    Goal of Therapy:  Vanco trough= 15-20  Plan:   Begin Vancomycin 1000mg  IV q 12 hours  Check Vanc trough at steady state  Continue Cefepime 1gm IV q12h as ordered per MD  Follow up renal function & cultures  Loletta Specter 03/06/2012,5:24 PM

## 2012-03-06 NOTE — Discharge Instructions (Signed)
Elevate your left leg as much as possible. Ambulate non wgt bearing on the left with a walker.

## 2012-03-06 NOTE — Brief Op Note (Addendum)
03/06/2012  2:10 PM  PATIENT:  Timothy [name]  78 y.o. male  PRE-OPERATIVE DIAGNOSIS:  infection left foot with osteomyelitis of 1st metatarsal left foot  POST-OPERATIVE DIAGNOSIS:  infection left foot with osteomyelitis of 1st metatarsal left foot  PROCEDURE:  Procedure(s) (LRB): AMPUTATION RAY (Left)1st ray  SURGEON:  Surgeon(s) and Role:    * Harvie Junior, MD - Primary  PHYSICIAN ASSISTANT: [place] PA-Cmin   ANESTHESIA:   general  EBL:   min  BLOOD ADMINISTERED:none  DRAINS: Penrose drain in the foot   LOCAL MEDICATIONS USED:  NONE  SPECIMEN:  Source of Specimen:  1 st ray  DISPOSITION OF SPECIMEN:  PATHOLOGY  COUNTS:  YES  TOURNIQUET:  * Missing tourniquet times found for documented tourniquets in log:  33743 * Total Tourniquet Time Documented: Calf (Left) - 13 minutes  DICTATION: .Other Dictation: Dictation Number [id number]  PLAN OF CARE: Admit to inpatient   PATIENT DISPOSITION:  PACU - hemodynamically stable.   Delay start of Pharmacological VTE agent (>24hrs) due to surgical blood loss or risk of bleeding: no

## 2012-03-06 NOTE — Anesthesia Preprocedure Evaluation (Addendum)
Anesthesia Evaluation  Patient identified by MRN, date of birth, ID band Patient awake  General Assessment Comment:Multiple Myeloma (Dr. Darnelle Catalan)  Reviewed: Allergy & Precautions, H&P , NPO status , Patient's Chart, lab work & pertinent test results  Airway Mallampati: II TM Distance: >3 FB Neck ROM: Full    Dental No notable dental hx.    Pulmonary neg pulmonary ROS, shortness of breath,  breath sounds clear to auscultation  Pulmonary exam normal       Cardiovascular hypertension, Pt. on home beta blockers + CAD and +CHF + Valvular Problems/Murmurs Rhythm:Regular Rate:Normal  ECHO: EF 30% (2011). Diastolic dysfunction. ECG reviewed.   Neuro/Psych  Neuromuscular disease negative neurological ROS  negative psych ROS   GI/Hepatic negative GI ROS, Neg liver ROS,   Endo/Other  negative endocrine ROS  Renal/GU Renal InsufficiencyRenal disease  negative genitourinary   Musculoskeletal negative musculoskeletal ROS (+)   Abdominal   Peds negative pediatric ROS (+)  Hematology negative hematology ROS (+)   Anesthesia Other Findings   Reproductive/Obstetrics negative OB ROS                          Anesthesia Physical Anesthesia Plan  ASA: III  Anesthesia Plan: General   Post-op Pain Management:    Induction: Intravenous  Airway Management Planned: LMA  Additional Equipment:   Intra-op Plan:   Post-operative Plan: Extubation in OR  Informed Consent: I have reviewed the patients History and Physical, chart, labs and discussed the procedure including the risks, benefits and alternatives for the proposed anesthesia with the patient or authorized representative who has indicated his/her understanding and acceptance.   Dental advisory given  Plan Discussed with: CRNA  Anesthesia Plan Comments:         Anesthesia Quick Evaluation

## 2012-03-07 ENCOUNTER — Ambulatory Visit: Payer: Medicare Other

## 2012-03-07 ENCOUNTER — Other Ambulatory Visit: Payer: Medicare Other | Admitting: Lab

## 2012-03-07 LAB — C-REACTIVE PROTEIN: CRP: 1.9 mg/dL — ABNORMAL HIGH (ref <0.60)

## 2012-03-07 MED ORDER — SODIUM CHLORIDE 0.9 % IJ SOLN
10.0000 mL | Freq: Two times a day (BID) | INTRAMUSCULAR | Status: DC
Start: 2012-03-07 — End: 2012-03-08
  Administered 2012-03-08: 10 mL

## 2012-03-07 MED ORDER — SODIUM CHLORIDE 0.9 % IJ SOLN: 10.0000 mL | INTRAMUSCULAR | Status: DC | PRN

## 2012-03-07 NOTE — Progress Notes (Signed)
Pt has tried several times to urinate with urge but only slow dribble and not completing.  Bladder scan showed .  Notified MD.  Received new orders.  Will continue to monitor pt. Timothy [name] 10:17 PM 03/07/2012

## 2012-03-07 NOTE — Progress Notes (Signed)
INFECTIOUS DISEASE PROGRESS NOTE  ID: Timothy [name] is a 78 y.o. male with   Principal Problem:  *Osteomyelitis of left foot Active Problems:  Open wound of left foot  Subjective: Without compliants, no diarrhea, no rash, no dysphagia, no oral sores.   Abtx:  Anti-infectives     Start     Dose/Rate Route Frequency Ordered Stop   03/06/12 2000   valACYclovir (VALTREX) tablet 500 mg        500 mg Oral Daily 03/06/12 1733     03/06/12 1800   ceFEPIme (MAXIPIME) 1 g in dextrose 5 % 50 mL IVPB        1 g 100 mL/hr over 30 Minutes Intravenous Every 12 hours 03/06/12 1640     03/06/12 1800   vancomycin (VANCOCIN) IVPB 1000 mg/200 mL premix        1,000 mg 200 mL/hr over 60 Minutes Intravenous Every 12 hours 03/06/12 1753     03/06/12 1041   ceFAZolin (ANCEF) IVPB 2 g/50 mL premix        2 g 100 mL/hr over 30 Minutes Intravenous 60 min pre-op 03/06/12 1041 03/06/12 1330          Medications:  Scheduled:   . allopurinol  150 mg Oral Daily  . aspirin EC  81 mg Oral Q M,W,F  . aspirin EC  81 mg Oral Q M,W,F  . atorvastatin  20 mg Oral q1800  . carvedilol  3.125 mg Oral BID WC  . ceFEPime (MAXIPIME) IV  1 g Intravenous Q12H  . citalopram  10 mg Oral QHS  . docusate sodium  100 mg Oral BID  . gabapentin  600 mg Oral QHS  . lisinopril  20 mg Oral q1800  . sodium chloride  10-40 mL Intracatheter Q12H  . Tamsulosin HCl  0.4 mg Oral QPC supper  . valACYclovir  500 mg Oral Daily  . vancomycin  1,000 mg Intravenous Q12H  . vitamin C  500 mg Oral Daily  . DISCONTD: aspirin EC  81 mg Oral Daily  . DISCONTD: aspirin EC  81 mg Oral Q M,W,F  . DISCONTD: atorvastatin  20 mg Oral BID  . DISCONTD: atorvastatin  20 mg Oral q1800  . DISCONTD: fludrocortisone  0.1 mg Oral Daily  . DISCONTD: lisinopril  20 mg Oral q1800  . DISCONTD: povidone-iodine   Topical Once  . DISCONTD: Tamsulosin HCl  0.4 mg Oral Daily  . DISCONTD: Tamsulosin HCl  0.4 mg Oral QPC supper    Objective: Vital  signs in last 24 hours: Temp:  [97.2 F (36.2 C)-98.3 F (36.8 C)] 98.3 F (36.8 C) (04/18 1400) Pulse Rate:  [56-96] 96  (04/18 1400) Resp:  [12-18] 18  (04/18 1400) BP: (133-171)/(62-82) 133/62 mmHg (04/18 1400) SpO2:  [95 %-100 %] 96 % (04/18 1400) Weight:  [82.373 kg (181 lb 9.6 oz)-87.4 kg (192 lb 10.9 oz)] 87.4 kg (192 lb 10.9 oz) (04/18 0600)   Throat: normal findings: buccal mucosa normal and soft palate, uvula, and tonsils normal Extremities: LLE wrapped, groslly nl light touch in toes  Lab Results No results found for this basename: WBC:2,HGB:2,HCT:2,PLATELETS:2,NA:2,K:2,CL:2,CO2:2,BUN:2,CREATININE:2,GLU:2 in the last 72 hours Liver Panel No results found for this basename: PROT:2,ALBUMIN:2,AST:2,ALT:2,ALKPHOS:2,BILITOT:2,BILIDIR:2,IBILI:2 in the last 72 hours Sedimentation Rate  Basename 03/06/12 1819  ESRSEDRATE 27*   C-Reactive Protein  Basename 03/06/12 1819  CRP 1.90*    Microbiology: Recent Results (from the past 240 hour(s))  SURGICAL PCR SCREEN     Status:  Normal   Collection Time   03/01/12  3:06 PM      Component Value Range Status Comment   MRSA, PCR NEGATIVE  NEGATIVE  Final    Staphylococcus aureus NEGATIVE  NEGATIVE  Final   WOUND CULTURE     Status: Normal (Preliminary result)   Collection Time   03/06/12  1:30 PM      Component Value Range Status Comment   Specimen Description FOOT LEFT INFECTED   Final    Special Requests NONE   Final    Gram Stain     Final    Value: NO WBC SEEN     NO SQUAMOUS EPITHELIAL CELLS SEEN     NO ORGANISMS SEEN   Culture NO GROWTH 1 DAY   Final    Report Status PENDING   Incomplete   ANAEROBIC CULTURE     Status: Normal (Preliminary result)   Collection Time   03/06/12  1:30 PM      Component Value Range Status Comment   Specimen Description FOOT LEFT INFECTED   Final    Special Requests NONE   Final    Gram Stain     Final    Value: NO WBC SEEN     NO SQUAMOUS EPITHELIAL CELLS SEEN     NO ORGANISMS SEEN    Culture     Final    Value: NO ANAEROBES ISOLATED; CULTURE IN PROGRESS FOR 5 DAYS   Report Status PENDING   Incomplete     Studies/Results: No results found.   Assessment/Plan: Osteomyelitis L foot  PIC placed Day 2 cefepime and vancomycin Await Cx results Suggested to pt and his family that he will need 6 wks of IV anbx. If Cx (-) both of his current anbx. If Cx+, will amend his anbx.   Timothy [name] Infectious Diseases [phone] 03/07/2012, 3:26 PM   LOS: 1 day

## 2012-03-07 NOTE — Evaluation (Signed)
Physical Therapy Evaluation Patient Details Name: Timothy [name] MRN: [id number] DOB: 1927-12-07 Today's Date: 03/07/2012  Problem List:  Patient Active Problem List  Diagnoses  . Ischemic cardiomyopathy  . Multiple myeloma  . Shortness of breath dyspnea  . Hypertension  . Renal insufficiency  . Myeloma  . Anemia  . Systolic CHF, chronic  . CAD (coronary artery disease)  . S/P CABG (coronary artery bypass graft)  . Foot ulcer, left  . Hyponatremia  . Osteomyelitis of left foot  . Open wound of left foot    Past Medical History:  Past Medical History  Diagnosis Date  . ASCVD (arteriosclerotic cardiovascular disease)   . Anemia     chronic mild anemia  . Gout   . Hypertension   . Lumbar disc disease     post laminectomy  . Chronic back pain   . History of echocardiogram 11/02/2010    EF range of 30 to 35% / There is hypokinsesis of the basal-mild inferolateral myocardium  . CHF (congestive heart failure)   . Heart murmur   . Ischemic cardiomyopathy   . Renal insufficiency   . Peripheral neuropathy   . Inferior myocardial infarction 1986  . Hyperlipidemia   . SOB (shortness of breath)   . Fatigue   . Peripheral neuropathy   . Coronary artery disease     CABG 2002  . Blood transfusion     DEC 2012 - TWO UNITS  . 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  PT HAS BEEN OFF CHEMO SINCE HIS HOSPITALIZATION FEB 2013 FOR LEFT FOOT INFECTION  . Cancer   . History of shingles MARCH 2009    SHINGLE LESIONS WERE AROUND RIGHT EYE--PT HAS RESIDUAL ITCHING AROUND THE EYE.   Past Surgical History:  Past Surgical History  Procedure Date  . Laminectomy   . Inguinal hernia repair   . Cardiac catheterization 06/20/2000    Severe coronary disease (totally occluded right artery, 90% left circumflex, 50-60% intermediate, and 90-95% ostial left anterior descending)   . Tonsillectomy   . Coronary artery bypass graft 2002   x4 / Left internal mammary artery to the LAD.  / Saphenous vein graft to the right posterolateral.  /  Saphenous vein graft to  the ramus intermedius. / Saphenous vein graft to the circumflex marginal       PT Assessment/Plan/Recommendation PT Assessment Clinical Impression Statement: Patient s/p first ray amputation presents with decreased independence and safety with mobility with NWB left LE.  Will benefit from skilled PT in the acute setting to maximize safety with mobility for d./c home with family assist and HHPT. PT Recommendation/Assessment: Patient will need skilled PT in the acute care venue PT Problem List: Decreased strength;Decreased activity tolerance;Decreased balance;Decreased mobility;Pain PT Therapy Diagnosis : Acute pain;Generalized weakness PT Plan PT Frequency: Min 5X/week PT Treatment/Interventions: DME instruction;Functional mobility training;Therapeutic activities;Wheelchair mobility training;Balance training;Therapeutic exercise;Patient/family education PT Recommendation Follow Up Recommendations: Home health PT;Supervision/Assistance - 24 hour Equipment Recommended: None recommended by PT PT Goals  Acute Rehab PT Goals PT Goal Formulation: With patient Time For Goal Achievement: 7 days Pt will go Supine/Side to Sit: with supervision;with rail PT Goal: Supine/Side to Sit - Progress: Goal set today Pt will go Sit to Supine/Side: with supervision;with rail PT Goal: Sit to Supine/Side - Progress: Goal set today Pt will go Sit to Stand: with min assist;with upper extremity assist PT Goal: Sit to Stand - Progress: Goal set  today Pt will Transfer Bed to Chair/Chair to Bed: with supervision PT Transfer Goal: Bed to Chair/Chair to Bed - Progress: Goal set today Pt will Propel Wheelchair: 51 - 150 feet;with modified independence PT Goal: Propel Wheelchair - Progress: Goal set today  PT Evaluation Precautions/Restrictions  Precautions Precautions:  Fall Restrictions LLE Weight Bearing: Non weight bearing Prior Functioning  Home Living Lives With: Spouse;Other (Comment) (nephew) Available Help at Discharge: Family Type of Home: House Home Access: Stairs to enter [employer] of Steps: 1 Entrance Stairs-Rails: None Home Layout: Two level;Able to live on main level with bedroom/bathroom Alternate Level Stairs-Number of Steps: N/A Bathroom Shower/Tub: Health visitor: Handicapped height Bathroom Accessibility: Yes How Accessible: Accessible via wheelchair Home Adaptive Equipment: Bedside commode/3-in-1;Walker - rolling;Wheelchair - manual Prior Function Level of Independence: Needs assistance Needs Assistance: Dressing;Bathing;Meal Prep Comments: was getting HHPT  Cognition Cognition Overall Cognitive Status: Appears within functional limits for tasks assessed/performed Arousal/Alertness: Awake/alert Behavior During Session: [facility] for tasks performed Sensation/Coordination Sensation Light Touch: Impaired Detail Light Touch Impaired Details: Impaired LLE;Impaired RLE (mid calf and below with neuropathy) Extremity Assessment RLE Assessment RLE Assessment: Within Functional Limits LLE Assessment LLE Assessment: Exceptions to WFL LLE AROM (degrees) LLE Overall AROM Comments: WFL except ankle due to splint at foot LLE Strength LLE Overall Strength Comments: lifts against gravity and able to take resistance, ankle not tested Mobility (including Balance) Bed Mobility Bed Mobility: Yes Supine to Sit: 4: Min assist;HOB flat Supine to Sit Details (indicate cue type and reason): assist to pull trunk up Sitting - Scoot to Edge of Bed: 5: Supervision Sitting - Scoot to [employer] of Bed Details (indicate cue type and reason): for safety Transfers Transfers: Yes Sit to Stand: 3: Mod assist;With upper extremity assist;From bed Sit to Stand Details (indicate cue type and reason): cues for safety and NWB Stand to  Sit: 3: Mod assist;With upper extremity assist;To chair/3-in-1 Stand to Sit Details: cues to reach back and for controlled descent Stand Pivot Transfers: 3: Mod assist Stand Pivot Transfer Details (indicate cue type and reason): with rolling walker bed to chair NWB left LE with posterior bias,  cues for technique    Exercise  Other Exercises Other Exercises: Demonstrated squat pivot transfers bed to [facility] with NWB left LE End of Session PT - End of Session Equipment Utilized During Treatment: Gait belt Activity Tolerance: Patient tolerated treatment well Patient left: in chair;with call bell in reach;with family/visitor present General Behavior During Session: [facility] for tasks performed Cognition: [facility] for tasks performed  [facility] 03/07/2012, 12:40 PM

## 2012-03-07 NOTE — Op Note (Signed)
NAMEBILAL, MANZER NO.:  [id number]  MEDICAL RECORD NO.:  [id number]  LOCATION:  1313                         FACILITY:  [facility]  PHYSICIAN:  Harvie Junior, M.D.   DATE OF BIRTH:  [birth year]  DATE OF PROCEDURE:  03/06/2012 DATE OF DISCHARGE:                              OPERATIVE REPORT   PREOPERATIVE DIAGNOSIS:  Severe open wound, first metatarsophalangeal joint.  POSTOPERATIVE DIAGNOSIS:  Severe open wound, first metatarsophalangeal joint.  PROCEDURE: 1. Left first ray amputation. 2. Debridement of tendon and tendon sheath over the area of the medial     foot, left side.  SURGEON:  Harvie Junior, M.D.  ASSISTANT:  __________  ANESTHESIA:  General.  BRIEF HISTORY:  Mr. Berti is an 78 year old male with long history of severe open wound on his left first MTP joint, was treated conservatively for prolonged period of time with the wound management, wound care service, prolonged antibiotics, and dressing changes.  None of this seemed to be improving the situation.  We had a long talk about treatment options, although we felt that amputation was the only reasonable course of action.  It took him a while to come to this acceptance, and after failure of all conservative care and exhaustion of all reasonable options he was taken to the operating room for the first ray excision procedure.  PROCEDURE IN DETAIL:  The patient was taken to the operating room. After adequate anesthesia was obtained with general anesthetic, the patient was placed supine on the operating table.  Left leg was prepped and draped in a sterile fashion.  Following this, the leg was exsanguinated.  Blood pressure tourniquet inflated to 250 mmHg. Following this, a marking pen was used and we made a guided outline of skin incision, which we gave access to the first ray.  This was dissected down to the first ray and periosteal dissection was undertaken on the first ray, and then the  first ray was amputated near the base, was beveled in all directions.  Once this was completed, attention turned back to the wound where there was a pretty significant amount of a sort of drainage and a sort of dishwatery looking material, and this was debrided as best we could with a rongeur.  This actually went up the foot as well and this was debrided as best we could.  We then took 6 L of normal saline irrigation.  We irrigated the wound with pulsatile lavage irrigation and put a Penrose drain and tracking up this tendon sheath.  At this point, the wound was closed with nylon interrupted sutures.  The tourniquet was let down after about 15 minutes just for evaluation of bleeding and good bleeding on all edges of the wound and we allowed that to bleed a  little bit just to get some of the __________ out of the wound.  Once this was done, the irrigation again was undertaken and then this was closed with nylon sutures.  Sterile compressive dressing was applied as well as compressive wrap all the way up the leg and he did have some edematous area near the posterior splint.  He was then readmitted to the hospital,  and we will talk with the Infectious Disease Service about treatment options for him.     Harvie Junior, M.D.     Ranae Plumber  D:  03/06/2012  T:  03/07/2012  Job:  [id number]

## 2012-03-07 NOTE — Progress Notes (Signed)
Subjective: 1 Day Post-Op Procedure(s) (LRB): AMPUTATION RAY (Left) Patient reports pain as 2 on 0-10 scale.   Dr Ninetta Lights saw pt yest.  Objective: Vital signs in last 24 hours: Temp:  [97.2 F (36.2 C)-98.3 F (36.8 C)] 97.5 F (36.4 C) (04/18 0600) Pulse Rate:  [56-77] 70  (04/18 0600) Resp:  [12-22] 16  (04/18 0600) BP: (136-171)/(64-90) 136/64 mmHg (04/18 0600) SpO2:  [95 %-100 %] 95 % (04/18 0600) Weight:  [82.373 kg (181 lb 9.6 oz)-87.4 kg (192 lb 10.9 oz)] 87.4 kg (192 lb 10.9 oz) (04/18 0600)  Intake/Output from previous day: 04/17 0701 - 04/18 0700 In: 1810 [I.V.:1810] Out: 400 [Urine:350; Blood:50] Intake/Output this shift:    No results found for this basename: HGB:5 in the last 72 hours No results found for this basename: WBC:2,RBC:2,HCT:2,PLT:2 in the last 72 hours No results found for this basename: NA:2,K:2,CL:2,CO2:2,BUN:2,CREATININE:2,GLUCOSE:2,CALCIUM:2 in the last 72 hours No results found for this basename: LABPT:2,INR:2 in the last 72 hours Left leg: Posterior splint intact. N-V intact to toes.  Assessment/Plan: 1 Day Post-Op Procedure(s) (LRB): AMPUTATION RAY (Left)!st ray amputation Plan: Will need PIC line. Antibiotics per ID Appreciate their help. PT mobilize NWB on Left.    Timothy [name] 03/07/2012, 8:41 AM

## 2012-03-08 MED ORDER — ZOLPIDEM TARTRATE 5 MG PO TABS: 5.0000 mg | ORAL_TABLET | Freq: Every evening | ORAL | Status: DC | PRN

## 2012-03-08 MED ORDER — HEPARIN SOD (PORK) LOCK FLUSH 100 UNIT/ML IV SOLN
INTRAVENOUS | Status: AC
  Administered 2012-03-08: 12:00:00
  Filled 2012-03-08: qty 5

## 2012-03-08 MED ORDER — HYDROMORPHONE HCL 4 MG PO TABS: 4.0000 mg | ORAL_TABLET | ORAL | Status: DC | PRN

## 2012-03-08 MED ORDER — ASPIRIN 81 MG PO TABS: 81.0000 mg | ORAL_TABLET | Freq: Every day | ORAL | Status: DC

## 2012-03-08 MED ORDER — DEXTROSE 5 % IV SOLN: 1.0000 g | Freq: Two times a day (BID) | INTRAVENOUS | Status: DC

## 2012-03-08 NOTE — Progress Notes (Signed)
Subjective: 2 Days Post-Op Procedure(s) (LRB): AMPUTATION RAY (Left) Patient reports pain as 5 on 0-10 scale.   Taking po/voiding ok.  Objective: Vital signs in last 24 hours: Temp:  [97.7 F (36.5 C)-98.6 F (37 C)] 98.6 F (37 C) (04/19 0629) Pulse Rate:  [92-101] 93  (04/19 0629) Resp:  [16-18] 18  (04/19 0629) BP: (133-158)/(62-81) 145/75 mmHg (04/19 0629) SpO2:  [91 %-96 %] 93 % (04/19 0629)  Intake/Output from previous day: 04/18 0701 - 04/19 0700 In: 1802.7 [P.O.:480; I.V.:1322.7] Out: 750 [Urine:750] Intake/Output this shift:   Cx's show Gm neg rods. Neurovascular intact Sensation intact distally Incision: scant drainage Compartment soft Left foot wound stable with viable wound edges.   Assessment/Plan: 2 Days Post-Op Procedure(s) (LRB): AMPUTATION RAY (Left) Plan: Discussed case with Dr Ninetta Lights   He suggests 6 weeks of IV cefepime 1 gr q 12 hrs x 6 weeks. Will need HHRN for dressing changes QOD. HHPT eval NWB on left with walker. Discharge home today. F/U Dr Luiz Blare and Dr Ninetta Lights in 2 weeks.   Mohamed Portlock G 03/08/2012, 8:51 AM

## 2012-03-08 NOTE — Progress Notes (Signed)
CARE MANAGEMENT NOTE 03/08/2012  Patient:  Timothy [name], Timothy [name]   Account Number:  [id number]  Date Initiated:  03/08/2012  Documentation initiated by:  Arshawn Valdez  Subjective/Objective Assessment:   78 yo male admitted 03/06/12 with infection left foot and osteomyelitis of 1st metatarsal     Action/Plan:   D/C when medically stable.   Anticipated DC Date:  03/08/2012   Anticipated DC Plan:  HOME W HOME HEALTH SERVICES      DC Planning Services  CM consult      [facility] Choice  HOME HEALTH   Choice offered to / List presented to:  C-1 Patient        HH arranged  IV Antibiotics  HH-1 RN  HH-2 PT      [facility] agency  [facility]   Status of service:  Completed, signed off   Discharge Disposition:  HOME W HOME HEALTH SERVICES   Comments:  03/08/12, Kathi Der RNC-MNN, BSN, [phone], CM received referral.  CM met with pt and offered choice for [facility] services.  Pt states he has used [facility] recently and would like to use them again.  Darl Pikes at [facility] contacted with order and confirmation of [facility] services received.  Pt states that he has a RW, WC, and 3N1 at home and does not need any equipment for home.  He states that his wife and nephew will be able to assist at home as needed.

## 2012-03-08 NOTE — Progress Notes (Signed)
Physical Therapy Treatment Patient Details Name: Timothy [name] MRN: [id number] DOB: 03/11/1928 Today's Date: 03/08/2012 Time: 4098-1191 PT Time Calculation (min): 27 min  PT Assessment / Plan / Recommendation Clinical Impression  Patient s/p first ray amputation presents with decreased independence and safety with mobility with NWB left LE.  Will benefit from skilled PT in the acute setting to maximize safety with mobility for d./c home with family assist and HHPT.    Follow Up Recommendations  Supervision/Assistance - 24 hour;Home health PT    Equipment Recommendations  None recommended by PT    Frequency Min 5X/week    Precautions / Restrictions Precautions Precautions: Fall Restrictions Weight Bearing Restrictions: Yes LLE Weight Bearing: Non weight bearing   Pertinent Vitals/Pain 5/10 left LE with activity    Mobility  Bed Mobility Supine to Sit: 3: Mod assist Sitting - Scoot to Edge of Bed: 5: Supervision (increased time and cues for technique) Details for Bed Mobility Assistance: assist to lift trunk and maintain while he was scooting forward Transfers Transfers: Sit to Stand;Stand to Sit;Squat Pivot Transfers;Stand Pivot Transfers Sit to Stand: 3: Mod assist;With upper extremity assist;From chair/3-in-1;With armrests Stand to Sit: 3: Mod assist;With armrests;With upper extremity assist;To chair/3-in-1 Stand Pivot Transfers: 3: Mod assist (with walker from bed to chair cues/assist for NWB left) Squat Pivot Transfers: 3: Mod assist (bed to w/c difficult with poor balance, nonadherence to NWB) Wheelchair Mobility Wheelchair Mobility: Yes Wheelchair Assistance: 5: Supervision;4: Administrator, sports Details (indicate cue type and reason): cues for turns and how to mantain straight path; needed assist for getting around door frame to get back into room Wheelchair Propulsion: Both upper extremities Wheelchair Parts Management: Needs assistance Distance: 100'      Exercises     PT Goals Acute Rehab PT Goals Time For Goal Achievement: 03/14/12 Pt will go Supine/Side to Sit: with supervision PT Goal: Supine/Side to Sit - Progress: Progressing toward goal Pt will go Sit to Stand: with min assist PT Goal: Sit to Stand - Progress: Progressing toward goal Pt will Transfer Bed to Chair/Chair to Bed: with supervision PT Transfer Goal: Bed to Chair/Chair to Bed - Progress: Progressing toward goal Pt will Propel Wheelchair: 51 - 150 feet;with modified independence PT Goal: Propel Wheelchair - Progress: Progressing toward goal  Visit Information  Last PT Received On: 03/08/12    Subjective Data  Subjective: Didn't sleep well, up trying to urinate and had a lot of pain.  They gave me an Ambien.  Supposed to go home today.   Cognition  Overall Cognitive Status: Appears within functional limits for tasks assessed/performed Arousal/Alertness: Lethargic (sleepy, but participates well) Behavior During Session: [facility] for tasks performed    Balance     End of Session PT - End of Session Equipment Utilized During Treatment: Gait belt Activity Tolerance: Patient limited by fatigue Patient left: in chair;with call bell/phone within reach    [facility] 03/08/2012, 10:34 AM

## 2012-03-08 NOTE — Progress Notes (Signed)
Pt able to void s/p bladder scan.  Will continue to monitor pt. Daphene Calamity [place] 3:07 AM 03/08/2012

## 2012-03-09 ENCOUNTER — Encounter (HOSPITAL_COMMUNITY): Payer: Self-pay | Admitting: *Deleted

## 2012-03-09 ENCOUNTER — Inpatient Hospital Stay (HOSPITAL_COMMUNITY)
Admission: EM | Admit: 2012-03-09 | Discharge: 2012-03-16 | DRG: 292 | Disposition: A | Discharge status: Home Health | Payer: Medicare Other | Attending: Internal Medicine | Admitting: Internal Medicine

## 2012-03-09 ENCOUNTER — Emergency Department (HOSPITAL_COMMUNITY): Payer: Medicare Other

## 2012-03-09 DIAGNOSIS — R4182 Altered mental status, unspecified: Secondary | ICD-10-CM | POA: Diagnosis present

## 2012-03-09 DIAGNOSIS — Z951 Presence of aortocoronary bypass graft: Secondary | ICD-10-CM

## 2012-03-09 DIAGNOSIS — F3289 Other specified depressive episodes: Secondary | ICD-10-CM | POA: Diagnosis present

## 2012-03-09 DIAGNOSIS — F329 Major depressive disorder, single episode, unspecified: Secondary | ICD-10-CM | POA: Diagnosis present

## 2012-03-09 DIAGNOSIS — E46 Unspecified protein-calorie malnutrition: Secondary | ICD-10-CM | POA: Diagnosis present

## 2012-03-09 DIAGNOSIS — E876 Hypokalemia: Secondary | ICD-10-CM | POA: Diagnosis present

## 2012-03-09 DIAGNOSIS — I509 Heart failure, unspecified: Secondary | ICD-10-CM | POA: Diagnosis present

## 2012-03-09 DIAGNOSIS — I5023 Acute on chronic systolic (congestive) heart failure: Secondary | ICD-10-CM | POA: Diagnosis present

## 2012-03-09 DIAGNOSIS — E871 Hypo-osmolality and hyponatremia: Secondary | ICD-10-CM | POA: Diagnosis present

## 2012-03-09 DIAGNOSIS — S91309A Unspecified open wound, unspecified foot, initial encounter: Secondary | ICD-10-CM | POA: Diagnosis present

## 2012-03-09 DIAGNOSIS — C9000 Multiple myeloma not having achieved remission: Secondary | ICD-10-CM | POA: Diagnosis present

## 2012-03-09 DIAGNOSIS — I252 Old myocardial infarction: Secondary | ICD-10-CM

## 2012-03-09 DIAGNOSIS — I2510 Atherosclerotic heart disease of native coronary artery without angina pectoris: Secondary | ICD-10-CM | POA: Diagnosis present

## 2012-03-09 DIAGNOSIS — S91302A Unspecified open wound, left foot, initial encounter: Secondary | ICD-10-CM | POA: Diagnosis present

## 2012-03-09 DIAGNOSIS — G4700 Insomnia, unspecified: Secondary | ICD-10-CM | POA: Diagnosis present

## 2012-03-09 DIAGNOSIS — A0472 Enterocolitis due to Clostridium difficile, not specified as recurrent: Secondary | ICD-10-CM | POA: Diagnosis present

## 2012-03-09 LAB — DIFFERENTIAL
Eosinophils Absolute: 0 10*3/uL (ref 0.0–0.7)
Lymphs Abs: 1.7 10*3/uL (ref 0.7–4.0)
Monocytes Relative: 12 % (ref 3–12)
Neutro Abs: 5.5 10*3/uL (ref 1.7–7.7)
Neutrophils Relative %: 68 % (ref 43–77)

## 2012-03-09 LAB — CBC
Hemoglobin: 9.8 g/dL — ABNORMAL LOW (ref 13.0–17.0)
MCH: 32.7 pg (ref 26.0–34.0)
Platelets: 173 10*3/uL (ref 150–400)
RBC: 3 MIL/uL — ABNORMAL LOW (ref 4.22–5.81)
WBC: 8.1 10*3/uL (ref 4.0–10.5)

## 2012-03-09 MED ORDER — SODIUM CHLORIDE 0.9 % IV SOLN
Freq: Once | INTRAVENOUS | Status: AC
  Administered 2012-03-09: 20 mL/h via INTRAVENOUS

## 2012-03-09 NOTE — ED Provider Notes (Signed)
History     CSN: [id number]  Arrival date & time 03/09/12  2056   First MD Initiated Contact with Patient 03/09/12 2259      Chief Complaint  Patient presents with  . Shortness of Breath    starting yesterday    (Consider location/radiation/quality/duration/timing/severity/associated sxs/prior treatment) HPI This is an 78 year old white male who had his left great toe amputated 3 days ago. He was discharged home from the hospital yesterday. He complains of shortness of-, gradually over the past 2 days, worsening today. It is moderate in intensity and worse lying supine and improved sitting up. He has not noticed a significant difference when ambulating. There is no associated cough, chest pain, fever, chills, nausea, vomiting or diarrhea. He is not on postoperative DVT prophylaxis apart from a daily 81 mg aspirin. He has also had some lightheadedness with this. He was placed on oxygen on arrival without improvement.  Past Medical History  Diagnosis Date  . ASCVD (arteriosclerotic cardiovascular disease)   . Anemia     chronic mild anemia  . Gout   . Hypertension   . Lumbar disc disease     post laminectomy  . Chronic back pain   . History of echocardiogram 11/02/2010    EF range of 30 to 35% / There is hypokinsesis of the basal-mild inferolateral myocardium  . CHF (congestive heart failure)   . Heart murmur   . Ischemic cardiomyopathy   . Renal insufficiency   . Peripheral neuropathy   . Inferior myocardial infarction 1986  . Hyperlipidemia   . SOB (shortness of breath)   . Fatigue   . Peripheral neuropathy   . Coronary artery disease     CABG 2002  . Blood transfusion     DEC 2012 - TWO UNITS  . 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  PT HAS BEEN OFF CHEMO SINCE HIS HOSPITALIZATION FEB 2013 FOR LEFT FOOT INFECTION  . Cancer   . History of shingles MARCH 2009    SHINGLE LESIONS WERE AROUND RIGHT EYE--PT HAS  RESIDUAL ITCHING AROUND THE EYE.    Past Surgical History  Procedure Date  . Laminectomy   . Inguinal hernia repair   . Cardiac catheterization 06/20/2000    Severe coronary disease (totally occluded right artery, 90% left circumflex, 50-60% intermediate, and 90-95% ostial left anterior descending)   . Tonsillectomy   . Coronary artery bypass graft 2002    x4 / Left internal mammary artery to the LAD.  / Saphenous vein graft to the right posterolateral.  /  Saphenous vein graft to  the ramus intermedius. / Saphenous vein graft to the circumflex marginal       History reviewed. No pertinent family history.  History  Substance Use Topics  . Smoking status: Former Smoker -- 1.0 packs/day for 20 years    Types: Cigarettes    Quit date: 11/20/1984  . Smokeless tobacco: Never Used  . Alcohol Use: No      Review of Systems  All other systems reviewed and are negative.    Allergies  Morphine and related  Home Medications   Current Outpatient Rx  Name Route Sig Dispense Refill  . ACETAMINOPHEN 500 MG PO TABS Oral Take 500 mg by mouth every 4 (four) hours as needed. Pain     . ALLOPURINOL 300 MG PO TABS Oral Take 150 mg by mouth daily.     . ASPIRIN 81 MG PO  TABS Oral Take 1 tablet (81 mg total) by mouth daily. 30 tablet 0  . ATORVASTATIN CALCIUM 40 MG PO TABS Oral Take 20 mg by mouth 2 (two) times daily.     . B COMPLETE PO Oral Take 0.5 tablets by mouth 2 (two) times daily.     Marland Kitchen CARVEDILOL 3.125 MG PO TABS Oral Take 1 tablet (3.125 mg total) by mouth 2 (two) times daily with a meal. 180 tablet 3  . CITALOPRAM HYDROBROMIDE 10 MG PO TABS Oral Take 10 mg by mouth at bedtime.    . CO Q-10 PO Oral Take 1 tablet by mouth daily.     . CYCLOPHOSPHAMIDE 50 MG PO TABS Oral Take 300 mg by mouth See admin instructions. 6 tablets for 300 mg on Saturdays and Sunday.    . CEFEPIME 1 GM IVPB Intravenous Inject 1 g into the vein every 12 (twelve) hours. 90 Units 0  . FLUDROCORTISONE ACETATE  0.1 MG PO TABS Oral Take 1 tablet (0.1 mg total) by mouth daily. 30 tablet 1  . GABAPENTIN 300 MG PO CAPS Oral Take 600 mg by mouth at bedtime.     Marland Kitchen HYDROMORPHONE HCL 4 MG PO TABS Oral Take 1 tablet (4 mg total) by mouth every 4 (four) hours as needed. 40 tablet 0  . LISINOPRIL 20 MG PO TABS Oral Take 20 mg by mouth daily.    . MULTIVITAMINS PO TABS Oral Take 0.5 tablets by mouth 2 (two) times daily.     Marland Kitchen FISH OIL PO Oral Take 1 capsule by mouth 2 (two) times daily.     Marland Kitchen TAMSULOSIN HCL 0.4 MG PO CAPS Oral Take 1 capsule (0.4 mg total) by mouth daily. 90 capsule 1  . VALACYCLOVIR HCL 500 MG PO TABS Oral Take 500 mg by mouth daily. CONT.    Marland Kitchen VITAMIN C 500 MG PO TABS Oral Take 500 mg by mouth daily.    Marland Kitchen ZINC 50 MG PO TABS Oral Take 50 mg by mouth daily.    Marland Kitchen ZOLPIDEM TARTRATE 5 MG PO TABS Oral Take 1 tablet (5 mg total) by mouth at bedtime as needed for sleep. 45 tablet 0    BP 147/69  Pulse 88  Temp(Src) 98.3 F (36.8 C) (Oral)  Resp 20  SpO2 100%  Physical Exam General: Well-developed, well-nourished male in no acute distress; appearance consistent with age of record HENT: normocephalic, atraumatic Eyes: pupils equal round and reactive to light; extraocular muscles intact Neck: supple Heart: regular rate and rhythm; frequent ectopy Lungs: Rales in the bases Abdomen: soft; nondistended; nontender; no masses or hepatosplenomegaly; bowel sounds present Extremities: No deformity; full range of motion; no edema of the right lower leg, left lower leg in short leg splint Neurologic: Awake, alert and oriented; motor function intact in all extremities and symmetric; no facial droop Skin: Warm and dry Psychiatric: Normal mood and affect    ED Course  Procedures (including critical care time)     MDM  EKG Interpretation:  Date & Time: 03/09/2012 10:12 PM  Rate: 90  Rhythm: normal sinus rhythm and premature ventricular contractions (PVC)  QRS Axis: normal  Intervals: normal   ST/T Wave abnormalities: nonspecific ST changes  Conduction Disutrbances:nonspecific intraventricular conduction delay  Narrative Interpretation:   Old EKG Reviewed: Rate is faster, ectopy more frequent  Nursing notes and vitals signs, including pulse oximetry, reviewed.  Summary of this visit's results, reviewed by myself:  Labs:  Results for orders placed during the  hospital encounter of 03/09/12  CBC      Component Value Range   WBC 8.1  4.0 - 10.5 (K/uL)   RBC 3.00 (*) 4.22 - 5.81 (MIL/uL)   Hemoglobin 9.8 (*) 13.0 - 17.0 (g/dL)   HCT 95.6 (*) 21.3 - 52.0 (%)   MCV 95.3  78.0 - 100.0 (fL)   MCH 32.7  26.0 - 34.0 (pg)   MCHC 34.3  30.0 - 36.0 (g/dL)   RDW 08.6  57.8 - 46.9 (%)   Platelets 173  150 - 400 (K/uL)  DIFFERENTIAL      Component Value Range   Neutrophils Relative 68  43 - 77 (%)   Neutro Abs 5.5  1.7 - 7.7 (K/uL)   Lymphocytes Relative 20  12 - 46 (%)   Lymphs Abs 1.7  0.7 - 4.0 (K/uL)   Monocytes Relative 12  3 - 12 (%)   Monocytes Absolute 0.9  0.1 - 1.0 (K/uL)   Eosinophils Relative 0  0 - 5 (%)   Eosinophils Absolute 0.0  0.0 - 0.7 (K/uL)   Basophils Relative 0  0 - 1 (%)   Basophils Absolute 0.0  0.0 - 0.1 (K/uL)  BASIC METABOLIC PANEL      Component Value Range   Sodium 134 (*) 135 - 145 (mEq/L)   Potassium 2.4 (*) 3.5 - 5.1 (mEq/L)   Chloride 99  96 - 112 (mEq/L)   CO2 25  19 - 32 (mEq/L)   Glucose, Bld 113 (*) 70 - 99 (mg/dL)   BUN 10  6 - 23 (mg/dL)   Creatinine, Ser 6.29  0.50 - 1.35 (mg/dL)   Calcium 8.2 (*) 8.4 - 10.5 (mg/dL)   GFR calc non Af Amer 88 (*) >90 (mL/min)   GFR calc Af Amer >90  >90 (mL/min)  PRO B NATRIURETIC PEPTIDE      Component Value Range   Pro B Natriuretic peptide (BNP) 30443.0 (*) 0 - 450 (pg/mL)  TROPONIN I      Component Value Range   Troponin I <0.30  <0.30 (ng/mL)    Imaging Studies: Dg Chest 2 View  03/10/2012  *RADIOLOGY REPORT*  Clinical Data: Dizziness.  Hypertension.  Shortness of breath starting  yesterday.  CHEST - 2 VIEW  Comparison: 04/20/2011  Findings: Stable appearance of postoperative changes in the mediastinum.  Interval placement of a right PICC catheter with tip over the distal SVC region.  Increasing heart size and pulmonary vascularity suggesting developing congestive failure.  Small bilateral pleural effusion. Bilateral basilar atelectasis.  No focal airspace consolidation in the lungs.  No pneumothorax.  IMPRESSION: Developing congestive changes in the heart and lungs without obvious edema.  Small bilateral pleural effusions and basilar atelectasis.  Original Report Authenticated By: Marlon Pel, M.D.   2:51 AM Brisk diuresis after administration of IV Lasix. Patient feels better. We're repleting his potassium and will have him admitted.              Hanley Seamen, MD 03/10/12 [phone]

## 2012-03-09 NOTE — ED Notes (Signed)
Pt c/o SOB stated it gets worse when he lays down. Pt put on 2L/min nasal canula, Sp02 100%

## 2012-03-09 NOTE — ED Notes (Signed)
Pt c/o new onset shortness of breath since yesterday night and some dizziness which has not improved since yesterday. Pt s/p toe amputation on L foot x 4 days ago. Pt has hx of CHF and heart disease.

## 2012-03-10 ENCOUNTER — Encounter (HOSPITAL_COMMUNITY): Payer: Self-pay | Admitting: Internal Medicine

## 2012-03-10 DIAGNOSIS — E876 Hypokalemia: Secondary | ICD-10-CM | POA: Diagnosis present

## 2012-03-10 DIAGNOSIS — I5023 Acute on chronic systolic (congestive) heart failure: Secondary | ICD-10-CM | POA: Diagnosis present

## 2012-03-10 LAB — BASIC METABOLIC PANEL
BUN: 10 mg/dL (ref 6–23)
CO2: 25 mEq/L (ref 19–32)
CO2: 29 mEq/L (ref 19–32)
Calcium: 8.2 mg/dL — ABNORMAL LOW (ref 8.4–10.5)
Calcium: 8.4 mg/dL (ref 8.4–10.5)
Chloride: 99 mEq/L (ref 96–112)
Creatinine, Ser: 0.66 mg/dL (ref 0.50–1.35)
GFR calc Af Amer: >90 mL/min (ref >90)
GFR calc non Af Amer: 88 mL/min — ABNORMAL LOW (ref >90)
Glucose, Bld: 113 mg/dL — ABNORMAL HIGH (ref 70–99)
Glucose, Bld: 135 mg/dL — ABNORMAL HIGH (ref 70–99)
Potassium: 2.4 mEq/L — CL (ref 3.5–5.1)
Sodium: 133 mEq/L — ABNORMAL LOW (ref 135–145)
Sodium: 134 mEq/L — ABNORMAL LOW (ref 135–145)

## 2012-03-10 LAB — URINALYSIS, ROUTINE W REFLEX MICROSCOPIC
Glucose, UA: NEGATIVE mg/dL
Hgb urine dipstick: NEGATIVE
Ketones, ur: NEGATIVE mg/dL
Protein, ur: NEGATIVE mg/dL

## 2012-03-10 LAB — PRO B NATRIURETIC PEPTIDE: Pro B Natriuretic peptide (BNP): 30443 pg/mL — ABNORMAL HIGH (ref 0–450)

## 2012-03-10 LAB — TROPONIN I: Troponin I: <0.3 ng/mL (ref <0.30)

## 2012-03-10 MED ORDER — ONDANSETRON HCL 4 MG/2ML IJ SOLN: 4.0000 mg | Freq: Four times a day (QID) | INTRAMUSCULAR | Status: DC | PRN

## 2012-03-10 MED ORDER — TRAZODONE HCL 50 MG PO TABS
50.0000 mg | ORAL_TABLET | Freq: Every evening | ORAL | Status: DC | PRN
  Filled 2012-03-10: qty 1

## 2012-03-10 MED ORDER — CARVEDILOL 6.25 MG PO TABS
6.2500 mg | ORAL_TABLET | Freq: Two times a day (BID) | ORAL | Status: DC
  Administered 2012-03-10 – 2012-03-16 (×13): 6.25 mg via ORAL
  Filled 2012-03-10 (×14): qty 1

## 2012-03-10 MED ORDER — POTASSIUM CHLORIDE 20 MEQ/15ML (10%) PO LIQD
20.0000 meq | Freq: Once | ORAL | Status: AC
  Administered 2012-03-10: 20 meq via ORAL
  Filled 2012-03-10: qty 15

## 2012-03-10 MED ORDER — LORAZEPAM 1 MG PO TABS
ORAL_TABLET | ORAL | Status: AC
  Filled 2012-03-10: qty 1

## 2012-03-10 MED ORDER — LORAZEPAM 2 MG/ML IJ SOLN
1.0000 mg | Freq: Once | INTRAMUSCULAR | Status: AC
  Administered 2012-03-10: 1 mg via INTRAVENOUS
  Filled 2012-03-10: qty 1

## 2012-03-10 MED ORDER — VALACYCLOVIR HCL 500 MG PO TABS
500.0000 mg | ORAL_TABLET | Freq: Every day | ORAL | Status: DC
  Administered 2012-03-10 – 2012-03-16 (×7): 500 mg via ORAL
  Filled 2012-03-10 (×7): qty 1

## 2012-03-10 MED ORDER — POTASSIUM CHLORIDE 10 MEQ/100ML IV SOLN
10.0000 meq | INTRAVENOUS | Status: AC
  Administered 2012-03-10 (×6): 10 meq via INTRAVENOUS
  Filled 2012-03-10 (×6): qty 100

## 2012-03-10 MED ORDER — SODIUM CHLORIDE 0.9 % IJ SOLN: 3.0000 mL | INTRAMUSCULAR | Status: DC | PRN

## 2012-03-10 MED ORDER — DEXTROSE 5 % IV SOLN
1.0000 g | Freq: Two times a day (BID) | INTRAVENOUS | Status: DC
  Administered 2012-03-10 – 2012-03-16 (×13): 1 g via INTRAVENOUS
  Filled 2012-03-10 (×16): qty 1

## 2012-03-10 MED ORDER — GABAPENTIN 300 MG PO CAPS
600.0000 mg | ORAL_CAPSULE | Freq: Every day | ORAL | Status: DC
Start: 2012-03-10 — End: 2012-03-16
  Administered 2012-03-10 – 2012-03-15 (×6): 600 mg via ORAL
  Filled 2012-03-10 (×7): qty 2

## 2012-03-10 MED ORDER — ATORVASTATIN CALCIUM 20 MG PO TABS
20.0000 mg | ORAL_TABLET | Freq: Two times a day (BID) | ORAL | Status: DC
  Administered 2012-03-10 – 2012-03-16 (×13): 20 mg via ORAL
  Filled 2012-03-10 (×15): qty 1

## 2012-03-10 MED ORDER — LISINOPRIL 20 MG PO TABS
20.0000 mg | ORAL_TABLET | Freq: Every day | ORAL | Status: DC
  Administered 2012-03-10 – 2012-03-16 (×7): 20 mg via ORAL
  Filled 2012-03-10 (×7): qty 1

## 2012-03-10 MED ORDER — HEPARIN SODIUM (PORCINE) 5000 UNIT/ML IJ SOLN
5000.0000 [IU] | Freq: Three times a day (TID) | INTRAMUSCULAR | Status: DC
  Administered 2012-03-10 – 2012-03-16 (×19): 5000 [IU] via SUBCUTANEOUS
  Filled 2012-03-10 (×22): qty 1

## 2012-03-10 MED ORDER — HYDROMORPHONE HCL 2 MG PO TABS: 4.0000 mg | ORAL_TABLET | ORAL | Status: DC | PRN

## 2012-03-10 MED ORDER — POTASSIUM CHLORIDE CRYS ER 20 MEQ PO TBCR
40.0000 meq | EXTENDED_RELEASE_TABLET | Freq: Once | ORAL | Status: AC
  Administered 2012-03-10: 40 meq via ORAL
  Filled 2012-03-10: qty 2

## 2012-03-10 MED ORDER — CITALOPRAM HYDROBROMIDE 10 MG PO TABS
10.0000 mg | ORAL_TABLET | Freq: Every day | ORAL | Status: DC
  Administered 2012-03-10 – 2012-03-15 (×6): 10 mg via ORAL
  Filled 2012-03-10 (×7): qty 1

## 2012-03-10 MED ORDER — LORAZEPAM 1 MG PO TABS
1.0000 mg | ORAL_TABLET | Freq: Once | ORAL | Status: AC
  Administered 2012-03-10: 1 mg via ORAL
  Filled 2012-03-10: qty 1

## 2012-03-10 MED ORDER — FUROSEMIDE 10 MG/ML IJ SOLN
20.0000 mg | Freq: Three times a day (TID) | INTRAMUSCULAR | Status: AC
  Administered 2012-03-10 (×2): 20 mg via INTRAVENOUS
  Filled 2012-03-10 (×2): qty 2

## 2012-03-10 MED ORDER — ACETAMINOPHEN 325 MG PO TABS: 650.0000 mg | ORAL_TABLET | ORAL | Status: DC | PRN

## 2012-03-10 MED ORDER — SODIUM CHLORIDE 0.9 % IV SOLN: 250.0000 mL | INTRAVENOUS | Status: DC | PRN

## 2012-03-10 MED ORDER — ASPIRIN EC 81 MG PO TBEC
81.0000 mg | DELAYED_RELEASE_TABLET | Freq: Every day | ORAL | Status: DC
  Administered 2012-03-10 – 2012-03-16 (×7): 81 mg via ORAL
  Filled 2012-03-10 (×7): qty 1

## 2012-03-10 MED ORDER — HYDROMORPHONE HCL 2 MG PO TABS
4.0000 mg | ORAL_TABLET | Freq: Four times a day (QID) | ORAL | Status: DC | PRN
  Administered 2012-03-10 – 2012-03-16 (×8): 4 mg via ORAL
  Filled 2012-03-10 (×6): qty 2
  Filled 2012-03-10 (×2): qty 1
  Filled 2012-03-10: qty 2

## 2012-03-10 MED ORDER — TAMSULOSIN HCL 0.4 MG PO CAPS
0.4000 mg | ORAL_CAPSULE | Freq: Every day | ORAL | Status: DC
  Administered 2012-03-10 – 2012-03-16 (×7): 0.4 mg via ORAL
  Filled 2012-03-10 (×8): qty 1

## 2012-03-10 MED ORDER — ALLOPURINOL 150 MG HALF TABLET
150.0000 mg | ORAL_TABLET | Freq: Every day | ORAL | Status: DC
  Administered 2012-03-10 – 2012-03-16 (×7): 150 mg via ORAL
  Filled 2012-03-10 (×7): qty 1

## 2012-03-10 MED ORDER — SODIUM CHLORIDE 0.9 % IJ SOLN
10.0000 mL | INTRAMUSCULAR | Status: DC | PRN
Start: 2012-03-10 — End: 2012-03-16
  Administered 2012-03-10 – 2012-03-15 (×5): 10 mL
  Administered 2012-03-16: 20 mL
  Administered 2012-03-16: 10 mL

## 2012-03-10 MED ORDER — FUROSEMIDE 10 MG/ML IJ SOLN
40.0000 mg | Freq: Once | INTRAMUSCULAR | Status: AC
  Administered 2012-03-10: 40 mg via INTRAVENOUS
  Filled 2012-03-10: qty 4

## 2012-03-10 MED ORDER — SODIUM CHLORIDE 0.9 % IJ SOLN
3.0000 mL | Freq: Two times a day (BID) | INTRAMUSCULAR | Status: DC
  Administered 2012-03-10 – 2012-03-14 (×3): 3 mL via INTRAVENOUS

## 2012-03-10 MED ORDER — POTASSIUM CHLORIDE 10 MEQ/100ML IV SOLN
10.0000 meq | INTRAVENOUS | Status: AC
  Administered 2012-03-10 (×4): 10 meq via INTRAVENOUS
  Filled 2012-03-10 (×5): qty 100

## 2012-03-10 NOTE — Progress Notes (Signed)
Subjective:    Left foot partial amputation for infection  Activity level:  In bed  Diet tolerance:  light Voiding:  foley Patient reports pain as 1 on 0-10 scale.    Objective: Vital signs in last 24 hours: Temp:  [97.7 F (36.5 C)-98.3 F (36.8 C)] 97.7 F (36.5 C) (04/21 0655) Pulse Rate:  [61-103] 98  (04/21 0750) Resp:  [14-28] 28  (04/21 0750) BP: (128-173)/(58-91) 171/89 mmHg (04/21 0750) SpO2:  [92 %-100 %] 98 % (04/21 0750) Weight:  [78 kg (171 lb 15.3 oz)] 78 kg (171 lb 15.3 oz) (04/21 0655)  Labs:  Basename 03/09/12 2331  HGB 9.8*    Basename 03/09/12 2331  WBC 8.1  RBC 3.00*  HCT 28.6*  PLT 173    Basename 03/09/12 2331  NA 134*  K 2.4*  CL 99  CO2 25  BUN 10  CREATININE 0.66  GLUCOSE 113*  CALCIUM 8.2*   No results found for this basename: LABPT:2,INR:2 in the last 72 hours  Physical Exam:  ABD soft No cellulitis present Compartment soft Left foot dressing changed and wound looks great/ no drainage. Decreased sensation  Assessment/Plan:     Left foot partial amputation due to infection P: dressing changed today. Continue ABX  DR. Graves to see Mon    Giuliana Handyside R 03/10/2012, 11:04 AM

## 2012-03-10 NOTE — H&P (Signed)
PCP:  Timothy Dell, MD, MD Confirmed with pt Timothy [name], cardiology Pt also has a urologist   Chief Complaint:  Dypsnea   HPI: 85yoM with h/o multiple myeloma since 2007, CAD s/p inferior MI 1986 and CABG 2002,  ischemic CMP and chronic sCHF 30-35%, and s/p left 1st toe amputation for  osteomyelitis/chronic wound on 4/17 presents with dyspnea and found to have pulmonary  edema and hypoK.   Pt was last admitted to the hospital on 4/17 for an open wound of 1st digit of left foot,  present for 6 months, with osteomyelitis. He was admitted to surgery and had amputation of  the digit with ID consultation. He had PICC placed for planned long term ABx.   He went home and was doing OK except started developing dyspnea on Friday, worse with  exertion and laying flat, but without chest pain or pressure. He denies any leg swelling  or abdominal distention. He's been a little dizzy and wife reports a couple episodes of  clamminess, but no overt fevers, chills, sweats, no LOC, no GI symptoms. She was recording  his BP's at home and has a piece of paper with SBP's ranging 140-170's and DBP's up to  110's. They were concerned about the BP's and so presented to ED.   In the ED, vitals were stable. Labs with hypoNa 134, hypoK 2.4, normal renal. Trop  negative but BNP >30k. CBC stable. CXR showed placement of right PICC, increased heart  size and pulmonary vascularity suggesting CHF and small bilateral pleural effusion.   ROS as above otherwise with some decreased appetite and poor PO's. He had an episode of  sleep walking after taking ambien the other night. CHF has not been a prominent part of  his symptoms for a long time. ROS otherwise negative.   Past Medical History  Diagnosis Date  . ASCVD (arteriosclerotic cardiovascular disease)   . Anemia     chronic mild anemia  . Gout   . Hypertension   . Lumbar disc disease     post laminectomy  . Chronic back pain   . History of  echocardiogram 11/02/2010    EF range of 30 to 35% / There is hypokinsesis of the basal-mild inferolateral myocardium  . CHF (congestive heart failure)   . Heart murmur   . Ischemic cardiomyopathy   . Renal insufficiency   . Peripheral neuropathy   . Inferior myocardial infarction 1986  . Hyperlipidemia   . SOB (shortness of breath)   . Fatigue   . Peripheral neuropathy   . Coronary artery disease     CABG 2002  . Blood transfusion     DEC 2012 - TWO UNITS  . 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  PT HAS BEEN OFF CHEMO SINCE HIS HOSPITALIZATION FEB 2013 FOR LEFT FOOT INFECTION  . Cancer   . History of shingles MARCH 2009    SHINGLE LESIONS WERE AROUND RIGHT EYE--PT HAS RESIDUAL ITCHING AROUND THE EYE.    Past Surgical History  Procedure Date  . Laminectomy   . Inguinal hernia repair   . Cardiac catheterization 06/20/2000    Severe coronary disease (totally occluded right artery, 90% left circumflex, 50-60% intermediate, and 90-95% ostial left anterior descending)   . Tonsillectomy   . Coronary artery bypass graft 2002    x4 / Left internal mammary artery to the LAD.  / Saphenous vein graft to the right posterolateral.  /  Saphenous vein graft to  the ramus intermedius. / Saphenous vein graft to the circumflex marginal       Medications:  HOME MEDS: Reconciled by name with his wife  Prior to Admission medications   Medication Sig Start Date End Date Taking? Authorizing Provider  allopurinol (ZYLOPRIM) 300 MG tablet Take 150 mg by mouth daily.    Yes Historical Provider, MD  aspirin 81 MG tablet Take 1 tablet (81 mg total) by mouth daily. 03/08/12  Yes Matthew Folks, PA  atorvastatin (LIPITOR) 40 MG tablet Take 20 mg by mouth 2 (two) times daily.  08/08/11  Yes Timothy M [place], MD  B Complex-Biotin-FA (B COMPLETE PO) Take 0.5 tablets by mouth 2 (two) times daily.    Yes Historical Provider, MD  carvedilol (COREG) 3.125  MG tablet Take 1 tablet (3.125 mg total) by mouth 2 (two) times daily with a meal. 08/08/11  Yes Timothy M [place], MD  citalopram (CELEXA) 10 MG tablet Take 10 mg by mouth at bedtime. 01/31/12 01/30/13 Yes Amy Allegra Grana, PA  Coenzyme Q10 (CO Q-10 PO) Take 1 tablet by mouth daily.    Yes Historical Provider, MD  dextrose 5 % SOLN 50 mL with ceFEPIme 1 G SOLR 1 g Inject 1 g into the vein every 12 (twelve) hours. 03/08/12  Yes Matthew Folks, PA  gabapentin (NEURONTIN) 300 MG capsule Take 600 mg by mouth at bedtime.    Yes Historical Provider, MD  HYDROmorphone (DILAUDID) 4 MG tablet Take 1 tablet (4 mg total) by mouth every 4 (four) hours as needed. 03/08/12 03/18/12 Yes Matthew Folks, PA  lisinopril (PRINIVIL,ZESTRIL) 20 MG tablet Take 20 mg by mouth daily.   Yes Historical Provider, MD  multivitamin [facility]) per tablet Take 0.5 tablets by mouth 2 (two) times daily.    Yes Historical Provider, MD  Omega-3 Fatty Acids (FISH OIL PO) Take 1 capsule by mouth 2 (two) times daily.    Yes Historical Provider, MD  Tamsulosin HCl (FLOMAX) 0.4 MG CAPS Take 1 capsule (0.4 mg total) by mouth daily. 01/31/12  Yes Amy Allegra Grana, PA  valACYclovir (VALTREX) 500 MG tablet Take 500 mg by mouth daily. CONT.   Yes Historical Provider, MD  vitamin C (ASCORBIC ACID) 500 MG tablet Take 500 mg by mouth daily.   Yes Historical Provider, MD  Zinc 50 MG TABS Take 50 mg by mouth daily.   Yes Historical Provider, MD  acetaminophen (TYLENOL) 500 MG tablet Take 500 mg by mouth every 4 (four) hours as needed. Pain     Historical Provider, MD  cyclophosphamide (CYTOXAN) 50 MG tablet Take 300 mg by mouth See admin instructions. 6 tablets for 300 mg on Saturdays and Sunday. 12/18/11   Historical Provider, MD  fludrocortisone (FLORINEF) 0.1 MG tablet Take 1 tablet (0.1 mg total) by mouth daily. 01/31/12 01/30/13  Amy Allegra Grana, PA    Allergies:  Allergies  Allergen Reactions  . Morphine And Related Other (See Comments)    Could not seat  still    Social History:   reports that he quit smoking about 27 years ago. His smoking use included Cigarettes. He has a 20 pack-year smoking history. He has never used smokeless tobacco. He reports that he does not drink alcohol or use illicit drugs. Lives at home with wife, has children but not with current wife. Still active, golfing and walking in the yard before the issue with the wound, now using a walker.   Family  History: History reviewed. No pertinent family history.  Physical Exam: Filed Vitals:   03/10/12 0230 03/10/12 0300 03/10/12 0330 03/10/12 0358  BP: 140/90 148/81 146/58 146/58  Pulse: 90 103 96 89  Temp:      TempSrc:    Oral  Resp: 20 22 23 16   SpO2: 95% 93% 93% 96%   Blood pressure 146/58, pulse 89, temperature 98.3 F (36.8 C), temperature source Oral, resp. rate 16, SpO2 96.00%. Gen: Thin, elderly, somewhat frail appearing M in no distress, breathing comfortably  without distress, accessory muscle use, or increased WOB. Able to relate history well,  pleasant, has to pee twice while being interviewed.  HEENT: Pupils round and reactive to light ~75mm, EOMI, sclera clear and normal appearing.  Mouth is moist and normal appearing. Face is ruddy.  Lungs: Quite CTAB actually, without crackles, wheezes, good air movement Heart: Minimally tachycardic and regular, with some ectopy noted. No murmurs or S3/4  heard.  Abd: Minimally distended, but soft, not tender, no facial grimacing, benign overall Extrem: Hands and feet are warm, not cool or cold. Radials palpable pretty easily. LLE is  in splint and wrapped well, but RLE doesn't show any edema.  Neuro: Alert, attentive, conversant, CN 2-12 intact without slurring or drooping, moves  extremities all on his own, able to roll around in bed with assistance, sits up with  assistance, grossly non-focal.    Labs & Imaging Results for orders placed during the hospital encounter of 03/09/12 (from the past 48 hour(s))  CBC      Status: Abnormal   Collection Time   03/09/12 11:31 PM      Component Value Range Comment   WBC 8.1  4.0 - 10.5 (K/uL)    RBC 3.00 (*) 4.22 - 5.81 (MIL/uL)    Hemoglobin 9.8 (*) 13.0 - 17.0 (g/dL)    HCT 65.7 (*) 84.6 - 52.0 (%)    MCV 95.3  78.0 - 100.0 (fL)    MCH 32.7  26.0 - 34.0 (pg)    MCHC 34.3  30.0 - 36.0 (g/dL)    RDW 96.2  95.2 - 84.1 (%)    Platelets 173  150 - 400 (K/uL)   DIFFERENTIAL     Status: Normal   Collection Time   03/09/12 11:31 PM      Component Value Range Comment   Neutrophils Relative 68  43 - 77 (%)    Neutro Abs 5.5  1.7 - 7.7 (K/uL)    Lymphocytes Relative 20  12 - 46 (%)    Lymphs Abs 1.7  0.7 - 4.0 (K/uL)    Monocytes Relative 12  3 - 12 (%)    Monocytes Absolute 0.9  0.1 - 1.0 (K/uL)    Eosinophils Relative 0  0 - 5 (%)    Eosinophils Absolute 0.0  0.0 - 0.7 (K/uL)    Basophils Relative 0  0 - 1 (%)    Basophils Absolute 0.0  0.0 - 0.1 (K/uL)   BASIC METABOLIC PANEL     Status: Abnormal   Collection Time   03/09/12 11:31 PM      Component Value Range Comment   Sodium 134 (*) 135 - 145 (mEq/L)    Potassium 2.4 (*) 3.5 - 5.1 (mEq/L)    Chloride 99  96 - 112 (mEq/L)    CO2 25  19 - 32 (mEq/L)    Glucose, Bld 113 (*) 70 - 99 (mg/dL)    BUN 10  6 - 23 (  mg/dL)    Creatinine, Ser 1.61  0.50 - 1.35 (mg/dL)    Calcium 8.2 (*) 8.4 - 10.5 (mg/dL)    GFR calc non Af Amer 88 (*) >90 (mL/min)    GFR calc Af Amer >90  >90 (mL/min)   PRO B NATRIURETIC PEPTIDE     Status: Abnormal   Collection Time   03/09/12 11:31 PM      Component Value Range Comment   Pro B Natriuretic peptide (BNP) 30443.0 (*) 0 - 450 (pg/mL)   TROPONIN I     Status: Normal   Collection Time   03/09/12 11:31 PM      Component Value Range Comment   Troponin I <0.30  <0.30 (ng/mL)    Dg Chest 2 View  03/10/2012  *RADIOLOGY REPORT*  Clinical Data: Dizziness.  Hypertension.  Shortness of breath starting yesterday.  CHEST - 2 VIEW  Comparison: 04/20/2011  Findings: Stable appearance  of postoperative changes in the mediastinum.  Interval placement of a right PICC catheter with tip over the distal SVC region.  Increasing heart size and pulmonary vascularity suggesting developing congestive failure.  Small bilateral pleural effusion. Bilateral basilar atelectasis.  No focal airspace consolidation in the lungs.  No pneumothorax.  IMPRESSION: Developing congestive changes in the heart and lungs without obvious edema.  Small bilateral pleural effusions and basilar atelectasis.  Original Report Authenticated By: Marlon Pel, M.D.    ECG: Several PVC's back to back at beginning of stip, then NSR 90 bpm, grossly normal  axis, borderline LBBB, TW flattening laterally, no ischemic ST deviations other than that  associated with LBBB.   Telemetry with a lot of PVC's.   10/2010  Study Conclusions  - Left ventricle: The cavity size was mildly dilated. Wall thickness    was normal. Systolic function was moderately to severely reduced.    The estimated ejection fraction was in the range of 30% to 35%.    There is hypokinesis of the basal-mid inferolateral myocardium.    There is akinesis of the basal-mid inferior myocardium. Doppler    parameters are consistent with abnormal left ventricular    relaxation (grade 1 diastolic dysfunction).  - Mitral valve: Mild regurgitation.  - Left atrium: The atrium was mildly to moderately dilated.  - Right ventricle: The cavity size was mildly dilated. Wall    thickness was normal. Systolic function was mildly reduced.  - Right atrium: The atrium was mildly dilated.  - Pulmonary arteries: Systolic pressure was mildly increased.   Impression Present on Admission:  .Acute on chronic systolic congestive heart failure .Hyponatremia .Open wound of left foot .Hypokalemia .Multiple myeloma  83yoM with h/o multiple myeloma since 2007, CAD s/p inferior MI 1986 and CABG 2002,  ischemic CMP and chronic sCHF 30-35%, and s/p left 1st toe  amputation for  osteomyelitis/chronic wound on 4/17 presents with dyspnea and found to have pulmonary  edema and hypoK.   1. Acute on chronic systolic CHF: BNP quite elevated, but pt not that overtly overloaded,  without evidence of peripheral edema, lungs quite clear, and CXR with edema but not  overwhelming. I suspect based on history that this is due to IVF's received through his  recent operative admission, or is flash edema based on recently elevated BP's, and based  off this see little reason to repeat echo in favor of just treating, especially given that  before this presentation, he had no ongoing CHF symptoms. Therefore, will admit and  diurese and once pt  feeling better can likely d/c. No evidence or symptoms of ischemia.   - Admit HF protocol.  - Continue home lisinopril, ASA 81, holding fludrocortisone which pt was not taking  anyways (was on previously due to low BP's). Increase coreg to 6.25 BID, may need more  titration if BP's running high  - Lasix IV: will give 20 mg IV q8 for 2 more doses, MD titrate thereafter   2. HypoK: Suspect this may be poor PO intake. No n/v to suggest GI losses. Could be renal.  For now, will replete and monitor. Getting 40 IV in ED, 20 PO. Will give 60 more IV and 40  PO x2 - BMET later today   3. HypoNa: Prior d/c summary indicates possibly due to SIADH of malignancy. It's presently  minimal and stable, monitoring.   4. S/p LLE 1st digit amp: Will continue home ABx via RUE PICC and will need wound dressing  changes.    5. Multiple myeloma: Pt has been off cytoxan since 12/2011 per family due to the wound.   6. Continue celexa, dilaudid home med, flomax, allopurinol, statin, gabapentin, valtrex.  Holding various vitamins and other non essential supplements. Of note, ambien made pt  sleep walk a couple nights ago, will discontinue it from his home meds list.   SubQ heparin Telemetry, [facility] team 2 Full code, discussed with pt and  wife  Other plans as per orders.   Vaniyah Lansky 03/10/2012, 5:15 AM

## 2012-03-10 NOTE — ED Notes (Signed)
Assisted patient onto bedpan. 

## 2012-03-10 NOTE — ED Notes (Signed)
Lab reports critical value of 2.4 potassium.

## 2012-03-10 NOTE — Progress Notes (Signed)
Patient ID: Timothy [name], male   DOB: 04/01/1928, 78 y.o.   MRN: [id number]   Clarification regarding antibiotics duration for osteomyelitis.  Patient will need a total of 42 days of cefepime for pseudomonas osteomyelitis, start date of 03/05/2012.  If questions, please contact jeff hatcher on Monday or myself.  Duke Salvia Drue Second MD MPH [facility] [phone]

## 2012-03-10 NOTE — Progress Notes (Addendum)
Subjective:   Chart reviewed. Patient indicates that he's feeling better with improvement of dyspnea. He denies chest pain or cough. Seems confused.  Objective  Vital signs in last 24 hours: Filed Vitals:   03/10/12 0655 03/10/12 0704 03/10/12 0750 03/10/12 1106  BP:  170/79 171/89 168/94  Pulse: 98  98 96  Temp: 97.7 F (36.5 C)     TempSrc: Oral     Resp: 20  28   Height: 5\' 10"  (1.778 m)     Weight: 78 kg (171 lb 15.3 oz)     SpO2: 98%  98% 95%   Weight change:   Intake/Output Summary (Last 24 hours) at 03/10/12 1329 Last data filed at 03/10/12 1321  Gross per 24 hour  Intake    790 ml  Output   1927 ml  Net  -1137 ml    Physical Exam:  General Exam: Comfortable. Chronically ill looking. Respiratory System: Slightly reduced breath sounds in the bases and occasional basal crackles left greater than the right. Rest of the lung fields are clear. No increased work of breathing.  Cardiovascular System: First and second heart sounds heard. Regular rate and rhythm. No JVD/murmurs. Telemetry shows sinus rhythm with PVCs Gastrointestinal System: Abdomen is non distended, soft and normal bowel sounds heard.  Central Nervous System: Alert and oriented to self and place only. No focal neurological deficits. Extremities: Trace bilateral leg edema. Symmetric 5 x 5 power.  Labs:  Basic Metabolic Panel:  Lab 03/09/12 1610  NA 134*  K 2.4*  CL 99  CO2 25  GLUCOSE 113*  BUN 10  CREATININE 0.66  CALCIUM 8.2*  ALB --  PHOS --   Liver Function Tests: No results found for this basename: AST:3,ALT:3,ALKPHOS:3,BILITOT:3,PROT:3,ALBUMIN:3 in the last 168 hours No results found for this basename: LIPASE:3,AMYLASE:3 in the last 168 hours No results found for this basename: AMMONIA:3 in the last 168 hours CBC:  Lab 03/09/12 2331  WBC 8.1  NEUTROABS 5.5  HGB 9.8*  HCT 28.6*  MCV 95.3  PLT 173   Cardiac Enzymes:  Lab 03/09/12 2331  CKTOTAL --  CKMB --  CKMBINDEX --    TROPONINI <0.30   CBG: No results found for this basename: GLUCAP:5 in the last 168 hours  Iron Studies: No results found for this basename: IRON,TIBC,TRANSFERRIN,FERRITIN in the last 72 hours Studies/Results: Dg Chest 2 View  03/10/2012  *RADIOLOGY REPORT*  Clinical Data: Dizziness.  Hypertension.  Shortness of breath starting yesterday.  CHEST - 2 VIEW  Comparison: 04/20/2011  Findings: Stable appearance of postoperative changes in the mediastinum.  Interval placement of a right PICC catheter with tip over the distal SVC region.  Increasing heart size and pulmonary vascularity suggesting developing congestive failure.  Small bilateral pleural effusion. Bilateral basilar atelectasis.  No focal airspace consolidation in the lungs.  No pneumothorax.  IMPRESSION: Developing congestive changes in the heart and lungs without obvious edema.  Small bilateral pleural effusions and basilar atelectasis.  Original Report Authenticated By: Marlon Pel, M.D.   Medications:      . sodium chloride   Intravenous Once  . allopurinol  150 mg Oral Daily  . aspirin EC  81 mg Oral Daily  . atorvastatin  20 mg Oral BID  . carvedilol  6.25 mg Oral Q12H  . ceFEPIme (MAXIPIME) 1 GM IVPB  1 g Intravenous Q12H  . citalopram  10 mg Oral QHS  . furosemide  20 mg Intravenous Q8H  . furosemide  40 mg  Intravenous Once  . gabapentin  600 mg Oral QHS  . heparin  5,000 Units Subcutaneous Q8H  . lisinopril  20 mg Oral Daily  . LORazepam  1 mg Intravenous Once  . LORazepam  1 mg Oral Once  . potassium chloride  10 mEq Intravenous Q1 Hr x 4  . potassium chloride  10 mEq Intravenous Q1 Hr x 6  . potassium chloride  20 mEq Oral Once  . sodium chloride  3 mL Intravenous Q12H  . Tamsulosin HCl  0.4 mg Oral Daily  . valACYclovir  500 mg Oral Daily    I  have reviewed scheduled and prn medications.     Problem/Plan: Principal Problem:  *Acute on chronic systolic congestive heart failure Active Problems:   Multiple myeloma  Hyponatremia  Open wound of left foot  Hypokalemia  1. Dyspnea likely secondary to acute on chronic systolic congestive heart failure in the context of recent fluid resuscitation during surgery. Clinically improved. Continue diuresis and monitor. Continue lisinopril and Coreg the 2. Hypokalemia: Secondary to poor oral intake and diuretics. Being repleted IV and by mouth. Followup BMP at 2 PM and addressed appropriately. 3. Anemia: Follow CBCs tomorrow. 4. Multiple myeloma: Outpatient followup with oncology. 5. CAD, status post CABG, ischemic cardiomyopathy with LVEF 30-35%: Continue lisinopril and Coreg. 6. Altered mental status: Unclear of patient's baseline mental status which is to be discussed with family. May be secondary to acute illness. Do not know if patient has underlying dementia. Monitor. Question secondary to medications. He received Ativan earlier today. Try to minimize/avoid benzodiazepines or opioids. 7. Uncontrolled hypertension: Coreg increased. Continue lisinopril. Monitor. 8. Status post recent left foot partial amputation for infection: Management per orthopedic whose input is appreciated. Continue IV antibiotics.  Discussed with patients spouse at bedside and updated care. Recent altered MS after Ambien. Came in to ED with anxiety and agitation, which seems to have worsened after Ativan. No prior known h/o dementia. Denies SOB. No focal deficits. Avoid psychotropic meds Viz: Ambien, Ativan, Opiods etc. If worsening agitation, consider sitter first. Updated care to spouse and answered questions. Discussed with nursing.  Alika Saladin 03/10/2012,1:29 PM  LOS: 1 day

## 2012-03-10 NOTE — ED Notes (Signed)
Report called to RN, 4E

## 2012-03-11 DIAGNOSIS — C9000 Multiple myeloma not having achieved remission: Secondary | ICD-10-CM

## 2012-03-11 LAB — CBC
HCT: 29.9 % — ABNORMAL LOW (ref 39.0–52.0)
Hemoglobin: 10.3 g/dL — ABNORMAL LOW (ref 13.0–17.0)
MCH: 32.4 pg (ref 26.0–34.0)
MCHC: 34.4 g/dL (ref 30.0–36.0)
MCV: 94 fL (ref 78.0–100.0)

## 2012-03-11 LAB — ANAEROBIC CULTURE: Gram Stain: NONE SEEN

## 2012-03-11 LAB — BASIC METABOLIC PANEL
BUN: 11 mg/dL (ref 6–23)
Chloride: 98 mEq/L (ref 96–112)
GFR calc non Af Amer: 81 mL/min — ABNORMAL LOW (ref >90)
Glucose, Bld: 95 mg/dL (ref 70–99)
Potassium: 3.1 mEq/L — ABNORMAL LOW (ref 3.5–5.1)

## 2012-03-11 LAB — DIFFERENTIAL
Basophils Relative: 0 % (ref 0–1)
Eosinophils Absolute: 0.1 10*3/uL (ref 0.0–0.7)
Eosinophils Relative: 1 % (ref 0–5)
Monocytes Absolute: 1 10*3/uL (ref 0.1–1.0)
Monocytes Relative: 14 % — ABNORMAL HIGH (ref 3–12)

## 2012-03-11 LAB — PRO B NATRIURETIC PEPTIDE: Pro B Natriuretic peptide (BNP): 25656 pg/mL — ABNORMAL HIGH (ref 0–450)

## 2012-03-11 MED ORDER — MAGNESIUM SULFATE 40 MG/ML IJ SOLN
2.0000 g | Freq: Once | INTRAMUSCULAR | Status: AC
  Administered 2012-03-11: 2 g via INTRAVENOUS
  Filled 2012-03-11: qty 50

## 2012-03-11 MED ORDER — POTASSIUM CHLORIDE CRYS ER 20 MEQ PO TBCR
40.0000 meq | EXTENDED_RELEASE_TABLET | ORAL | Status: AC
  Administered 2012-03-11 (×3): 40 meq via ORAL
  Filled 2012-03-11 (×3): qty 2

## 2012-03-11 MED ORDER — FUROSEMIDE 10 MG/ML IJ SOLN
40.0000 mg | Freq: Two times a day (BID) | INTRAMUSCULAR | Status: DC
  Administered 2012-03-11 – 2012-03-12 (×3): 40 mg via INTRAVENOUS
  Filled 2012-03-11 (×4): qty 4

## 2012-03-11 NOTE — Progress Notes (Signed)
   CARE MANAGEMENT NOTE 03/11/2012  Patient:  Timothy [name], Timothy [name]   Account Number:  [id number]  Date Initiated:  03/11/2012  Documentation initiated by:  [facility]  Subjective/Objective Assessment:   ADMITTED W/SOB.CHF.HX:L FOOT OSTEOMYELITIS     Action/Plan:   FROM HOME ALREADY ACTIVE W/[facility]-HHRN-IV ABX.   Anticipated DC Date:  03/12/2012   Anticipated DC Plan:  HOME W HOME HEALTH SERVICES         Choice offered to / List presented to:  C-1 Patient        HH arranged  HH-1 RN  IV Antibiotics  HH-10 DISEASE MANAGEMENT      HH agency  [facility]   Status of service:  In process, will continue to follow Medicare Important Message given?   (If response is "NO", the following Medicare IM given date fields will be blank) Date Medicare IM given:   Date Additional Medicare IM given:    Discharge Disposition:    Per UR Regulation:  Reviewed for med. necessity/level of care/duration of stay  If discussed at Long Length of Stay Meetings, dates discussed:    Comments:  03/11/12 Kelechi Orgeron RN,BSN NCM [phone] IF MD AGREE HHRN-DISEASE MGMNT(CHF PROTOCAL),WILL NEED HHRN ORDERS, & F2F.ALSO HHRN-IV ABX ORDER W/MED/DOSE/FREQUENCY/DURATION.[facility] SUSAN DALE NOTIFIED, & FOLLOWING OF ?D/C IN AM.

## 2012-03-11 NOTE — Progress Notes (Signed)
Subjective:   Not confused this morning. Denies dyspnea at rest. No chest pain or palpitations.  Objective  Vital signs in last 24 hours: Filed Vitals:   03/10/12 2146 03/11/12 0629 03/11/12 0944 03/11/12 1341  BP: 159/89 131/70  101/58  Pulse: 90 78 86 75  Temp: 97.6 F (36.4 C) 97.6 F (36.4 C)  98 F (36.7 C)  TempSrc: Oral Oral  Oral  Resp: 18 18  18   Height:      Weight:  75.3 kg (166 lb 0.1 oz)    SpO2: 95% 92%  95%   Weight change: -2.7 kg (-5 lb 15.2 oz)  Intake/Output Summary (Last 24 hours) at 03/11/12 1716 Last data filed at 03/11/12 1341  Gross per 24 hour  Intake    410 ml  Output   1700 ml  Net  -1290 ml    Physical Exam:  General Exam: Comfortable. Looks much improved compared to yesterday. Respiratory System: occasional basal crackles. Rest of the lung fields are clear. No increased work of breathing.  Cardiovascular System: First and second heart sounds heard. Regular rate and rhythm. No JVD/murmurs. Telemetry shows sinus rhythm with frequent PVCs. Gastrointestinal System: Abdomen is non distended, soft and normal bowel sounds heard.  Central Nervous System: Alert and oriented. No focal neurological deficits. Extremities: Trace bilateral leg edema. Symmetric 5 x 5 power.  Labs:  Basic Metabolic Panel:  Lab 03/11/12 1610 03/10/12 1620 03/09/12 2331  NA 136 133* 134*  K 3.1* 2.9* 2.4*  CL 98 95* 99  CO2 29 29 25   GLUCOSE 95 135* 113*  BUN 11 9 10   CREATININE 0.82 0.75 0.66  CALCIUM 8.3* 8.4 8.2*  ALB -- -- --  PHOS -- -- --   Liver Function Tests: No results found for this basename: AST:3,ALT:3,ALKPHOS:3,BILITOT:3,PROT:3,ALBUMIN:3 in the last 168 hours No results found for this basename: LIPASE:3,AMYLASE:3 in the last 168 hours No results found for this basename: AMMONIA:3 in the last 168 hours CBC:  Lab 03/11/12 0550 03/09/12 2331  WBC 7.0 8.1  NEUTROABS 4.6 5.5  HGB 10.3* 9.8*  HCT 29.9* 28.6*  MCV 94.0 95.3  PLT 190 173   Cardiac  Enzymes:  Lab 03/09/12 2331  CKTOTAL --  CKMB --  CKMBINDEX --  TROPONINI <0.30   CBG: No results found for this basename: GLUCAP:5 in the last 168 hours  Iron Studies: No results found for this basename: IRON,TIBC,TRANSFERRIN,FERRITIN in the last 72 hours Studies/Results: Dg Chest 2 View  03/10/2012  *RADIOLOGY REPORT*  Clinical Data: Dizziness.  Hypertension.  Shortness of breath starting yesterday.  CHEST - 2 VIEW  Comparison: 04/20/2011  Findings: Stable appearance of postoperative changes in the mediastinum.  Interval placement of a right PICC catheter with tip over the distal SVC region.  Increasing heart size and pulmonary vascularity suggesting developing congestive failure.  Small bilateral pleural effusion. Bilateral basilar atelectasis.  No focal airspace consolidation in the lungs.  No pneumothorax.  IMPRESSION: Developing congestive changes in the heart and lungs without obvious edema.  Small bilateral pleural effusions and basilar atelectasis.  Original Report Authenticated By: Marlon Pel, M.D.   Medications:      . allopurinol  150 mg Oral Daily  . aspirin EC  81 mg Oral Daily  . atorvastatin  20 mg Oral BID  . carvedilol  6.25 mg Oral Q12H  . ceFEPIme (MAXIPIME) 1 GM IVPB  1 g Intravenous Q12H  . citalopram  10 mg Oral QHS  . furosemide  40 mg Intravenous Q12H  . gabapentin  600 mg Oral QHS  . heparin  5,000 Units Subcutaneous Q8H  . lisinopril  20 mg Oral Daily  . magnesium sulfate 1 - 4 g bolus IVPB  2 g Intravenous Once  . potassium chloride  40 mEq Oral Once  . potassium chloride  40 mEq Oral Q4H  . sodium chloride  3 mL Intravenous Q12H  . Tamsulosin HCl  0.4 mg Oral Daily  . valACYclovir  500 mg Oral Daily    I  have reviewed scheduled and prn medications.     Problem/Plan: Principal Problem:  *Acute on chronic systolic congestive heart failure Active Problems:  Multiple myeloma  Hyponatremia  Open wound of left foot   Hypokalemia  1. Dyspnea likely secondary to acute on chronic systolic congestive heart failure in the context of recent fluid resuscitation during surgery. Clinically improved. IV Lasix for additional day then change to by mouth on discharge. Continue lisinopril and Coreg. 2. Hypokalemia: By mouth replete and follow BMP tomorrow. 3. Anemia: Stable. 4. Multiple myeloma: Outpatient followup with oncology. Informed Dr. Darnelle Catalan of patient's admission. 5. CAD, status post CABG, ischemic cardiomyopathy with LVEF 30-35%: Continue lisinopril and Coreg. 6. Altered mental status: Resolved. Unclear etiology.? Secondary to Ativan. Dilaudid withdrawal seems less likely because patient takes it infrequently. 7. Hypertension: Coreg increased. Continue lisinopril. Monitor. 8. Left foot wound status post resection of first ray 03/06/12. IV cefepime 1 g every 12 hours through 04/17/12. Outpatient followup with Dr. Johny Sax. Outpatient followup with Dr. Jodi Geralds on 03/14/12.  Discussed with patients spouse and extended family at bedside and updated care.    Disposition: Possible discharge home 4/23.    Timothy [name] 03/11/2012,5:16 PM  LOS: 2 days

## 2012-03-11 NOTE — Progress Notes (Signed)
INFECTIOUS DISEASE PROGRESS NOTE  ID: Timothy [name] is a 78 y.o. male with  Principal Problem:  *Acute on chronic systolic congestive heart failure Active Problems:  Multiple myeloma  Hyponatremia  Open wound of left foot  Hypokalemia  Subjective: Without complaint. Non sob or cough.   Abtx:  Anti-infectives     Start     Dose/Rate Route Frequency Ordered Stop   03/10/12 1000   valACYclovir (VALTREX) tablet 500 mg        500 mg Oral Daily 03/10/12 0658     03/10/12 0800   ceFEPIme (MAXIPIME) 1 g in dextrose 5 % 50 mL IVPB        1 g 100 mL/hr over 30 Minutes Intravenous Every 12 hours 03/10/12 0658            Medications:  Scheduled:   . allopurinol  150 mg Oral Daily  . aspirin EC  81 mg Oral Daily  . atorvastatin  20 mg Oral BID  . carvedilol  6.25 mg Oral Q12H  . ceFEPIme (MAXIPIME) 1 GM IVPB  1 g Intravenous Q12H  . citalopram  10 mg Oral QHS  . furosemide  40 mg Intravenous Q12H  . gabapentin  600 mg Oral QHS  . heparin  5,000 Units Subcutaneous Q8H  . lisinopril  20 mg Oral Daily  . magnesium sulfate 1 - 4 g bolus IVPB  2 g Intravenous Once  . potassium chloride  40 mEq Oral Once  . potassium chloride  40 mEq Oral Q4H  . sodium chloride  3 mL Intravenous Q12H  . Tamsulosin HCl  0.4 mg Oral Daily  . valACYclovir  500 mg Oral Daily    Objective: Vital signs in last 24 hours: Temp:  [97.6 F (36.4 C)-98 F (36.7 C)] 98 F (36.7 C) (04/22 1341) Pulse Rate:  [75-90] 75  (04/22 1341) Resp:  [18] 18  (04/22 1341) BP: (101-159)/(58-89) 101/58 mmHg (04/22 1341) SpO2:  [92 %-95 %] 95 % (04/22 1341) Weight:  [75.3 kg (166 lb 0.1 oz)] 75.3 kg (166 lb 0.1 oz) (04/22 0629)   General appearance: alert, cooperative and no distress Extremities: LLE is wrapped. he has decreased sensation in his L foot.   Lab Results  Basename 03/11/12 0550 03/10/12 1620 03/09/12 2331  WBC 7.0 -- 8.1  HGB 10.3* -- 9.8*  HCT 29.9* -- 28.6*  NA 136 133* --  K 3.1* 2.9* --    CL 98 95* --  CO2 29 29 --  BUN 11 9 --  CREATININE 0.82 0.75 --  GLU -- -- --   Liver Panel No results found for this basename: PROT:2,ALBUMIN:2,AST:2,ALT:2,ALKPHOS:2,BILITOT:2,BILIDIR:2,IBILI:2 in the last 72 hours Sedimentation Rate No results found for this basename: ESRSEDRATE in the last 72 hours C-Reactive Protein No results found for this basename: CRP:2 in the last 72 hours  Microbiology: Recent Results (from the past 240 hour(s))  WOUND CULTURE     Status: Normal   Collection Time   03/06/12  1:30 PM      Component Value Range Status Comment   Specimen Description FOOT LEFT INFECTED   Final    Special Requests NONE   Final    Gram Stain     Final    Value: NO WBC SEEN     NO SQUAMOUS EPITHELIAL CELLS SEEN     NO ORGANISMS SEEN   Culture RARE PSEUDOMONAS AERUGINOSA   Final    Report Status 03/09/2012 FINAL   Final  Organism ID, Bacteria PSEUDOMONAS AERUGINOSA   Final   ANAEROBIC CULTURE     Status: Normal   Collection Time   03/06/12  1:30 PM      Component Value Range Status Comment   Specimen Description FOOT LEFT INFECTED   Final    Special Requests NONE   Final    Gram Stain     Final    Value: NO WBC SEEN     NO SQUAMOUS EPITHELIAL CELLS SEEN     NO ORGANISMS SEEN   Culture NO ANAEROBES ISOLATED   Final    Report Status 03/11/2012 FINAL   Final     Studies/Results: Dg Chest 2 View  03/10/2012  *RADIOLOGY REPORT*  Clinical Data: Dizziness.  Hypertension.  Shortness of breath starting yesterday.  CHEST - 2 VIEW  Comparison: 04/20/2011  Findings: Stable appearance of postoperative changes in the mediastinum.  Interval placement of a right PICC catheter with tip over the distal SVC region.  Increasing heart size and pulmonary vascularity suggesting developing congestive failure.  Small bilateral pleural effusion. Bilateral basilar atelectasis.  No focal airspace consolidation in the lungs.  No pneumothorax.  IMPRESSION: Developing congestive changes in the  heart and lungs without obvious edema.  Small bilateral pleural effusions and basilar atelectasis.  Original Report Authenticated By: Marlon Pel, M.D.     Assessment/Plan: 78 yo M with hx of multiple myeloma, osteomyelitis of his L foot, ulcer of his L foot Resection of 1st ray 03-06-12 Cx: P Aeruginosa (R-Cipro) CHF Would continue his cefepime 1g IVPB q12 hours, to day 42 from his surgery (04-17-12). Glad to see in f/u in clinic.   Timothy [name] Infectious Diseases [phone] 03/11/2012, 3:31 PM   LOS: 2 days

## 2012-03-11 NOTE — Progress Notes (Signed)
Subjective: Feels good today   Objective: Vital signs in last 24 hours: Temp:  [97.6 F (36.4 C)-98.1 F (36.7 C)] 97.6 F (36.4 C) (04/22 0629) Pulse Rate:  [78-96] 78  (04/22 0629) Resp:  [18] 18  (04/22 0629) BP: (131-168)/(70-94) 131/70 mmHg (04/22 0629) SpO2:  [92 %-96 %] 92 % (04/22 0629) Weight:  [75.3 kg (166 lb 0.1 oz)] 75.3 kg (166 lb 0.1 oz) (04/22 0629)  Intake/Output from previous day: 04/21 0701 - 04/22 0700 In: 1060 [P.O.:360; IV Piggyback:700] Out: 2352 [Urine:2351; Stool:1] Intake/Output this shift: Total I/O In: 120 [P.O.:120] Out: -    Basename 03/11/12 0550 03/09/12 2331  HGB 10.3* 9.8*    Basename 03/11/12 0550 03/09/12 2331  WBC 7.0 8.1  RBC 3.18* 3.00*  HCT 29.9* 28.6*  PLT 190 173    Basename 03/11/12 0550 03/10/12 1620  NA 136 133*  K 3.1* 2.9*  CL 98 95*  CO2 29 29  BUN 11 9  CREATININE 0.82 0.75  GLUCOSE 95 135*  CALCIUM 8.3* 8.4   No results found for this basename: LABPT:2,INR:2 in the last 72 hours  wound looks good  Assessment/Plan: Follow wound.  See in office Thursday for wound check and placement of hard sole shoe.   Raynell Upton L 03/11/2012, 8:47 AM

## 2012-03-12 LAB — BASIC METABOLIC PANEL
Calcium: 9 mg/dL (ref 8.4–10.5)
Creatinine, Ser: 1 mg/dL (ref 0.50–1.35)
GFR calc non Af Amer: 68 mL/min — ABNORMAL LOW (ref >90)
Glucose, Bld: 90 mg/dL (ref 70–99)
Sodium: 134 mEq/L — ABNORMAL LOW (ref 135–145)

## 2012-03-12 LAB — URINE CULTURE
Colony Count: NO GROWTH
Culture  Setup Time: 201304220308
Culture: NO GROWTH

## 2012-03-12 LAB — CLOSTRIDIUM DIFFICILE BY PCR: Toxigenic C. Difficile by PCR: POSITIVE — AB

## 2012-03-12 LAB — MAGNESIUM: Magnesium: 1.9 mg/dL (ref 1.5–2.5)

## 2012-03-12 MED ORDER — FUROSEMIDE 40 MG PO TABS
40.0000 mg | ORAL_TABLET | Freq: Every day | ORAL | Status: DC
  Administered 2012-03-13 – 2012-03-16 (×4): 40 mg via ORAL
  Filled 2012-03-12 (×4): qty 1

## 2012-03-12 MED ORDER — POTASSIUM CHLORIDE CRYS ER 20 MEQ PO TBCR
40.0000 meq | EXTENDED_RELEASE_TABLET | Freq: Every day | ORAL | Status: DC
  Administered 2012-03-12 – 2012-03-16 (×5): 40 meq via ORAL
  Filled 2012-03-12 (×5): qty 2

## 2012-03-12 MED ORDER — PRO-STAT SUGAR FREE PO LIQD
30.0000 mL | Freq: Every day | ORAL | Status: DC
  Administered 2012-03-12: 30 mL via ORAL
  Filled 2012-03-12 (×2): qty 30

## 2012-03-12 MED ORDER — BOOST / RESOURCE BREEZE PO LIQD
1.0000 | Freq: Two times a day (BID) | ORAL | Status: DC
  Administered 2012-03-12 – 2012-03-13 (×2): 1 via ORAL

## 2012-03-12 MED ORDER — SACCHAROMYCES BOULARDII 250 MG PO CAPS
250.0000 mg | ORAL_CAPSULE | Freq: Two times a day (BID) | ORAL | Status: DC
  Administered 2012-03-12 – 2012-03-16 (×9): 250 mg via ORAL
  Filled 2012-03-12 (×10): qty 1

## 2012-03-12 MED ORDER — METRONIDAZOLE 500 MG PO TABS
500.0000 mg | ORAL_TABLET | Freq: Three times a day (TID) | ORAL | Status: DC
  Administered 2012-03-12 – 2012-03-16 (×13): 500 mg via ORAL
  Filled 2012-03-12 (×16): qty 1

## 2012-03-12 NOTE — Progress Notes (Signed)
Subjective:   Multiple episodes of diarrhea,? 15 episodes since last night. No abdominal pain or nausea or vomiting. No dyspnea.  Objective  Vital signs in last 24 hours: Filed Vitals:   03/11/12 1341 03/11/12 2238 03/12/12 0535 03/12/12 1400  BP: 101/58 126/82 134/78 123/71  Pulse: 75 77 83 72  Temp: 98 F (36.7 C) 97.8 F (36.6 C) 98 F (36.7 C) 98.1 F (36.7 C)  TempSrc: Oral Oral Oral Oral  Resp: 18 18 18 16   Height:      Weight:   72.8 kg (160 lb 7.9 oz)   SpO2: 95% 94% 97% 95%   Weight change: -2.5 kg (-5 lb 8.2 oz)  Intake/Output Summary (Last 24 hours) at 03/12/12 1747 Last data filed at 03/12/12 1500  Gross per 24 hour  Intake    410 ml  Output   2050 ml  Net  -1640 ml    Physical Exam:  General Exam: Comfortable.  Respiratory System: Clear to auscultation. No increased work of breathing.  Cardiovascular System: First and second heart sounds heard. Regular rate and rhythm. No JVD/murmurs. Gastrointestinal System: Abdomen is non distended, soft and normal bowel sounds heard.  Central Nervous System: Alert and oriented. No focal neurological deficits. Extremities: Trace bilateral leg edema. Symmetric 5 x 5 power. Left foot dressing clean dry and intact.  Labs:  Basic Metabolic Panel:  Lab 03/12/12 9147 03/11/12 0550 03/10/12 1620  NA 134* 136 133*  K 3.7 3.1* 2.9*  CL 97 98 95*  CO2 27 29 29   GLUCOSE 90 95 135*  BUN 14 11 9   CREATININE 1.00 0.82 0.75  CALCIUM 9.0 8.3* 8.4  ALB -- -- --  PHOS -- -- --   Liver Function Tests: No results found for this basename: AST:3,ALT:3,ALKPHOS:3,BILITOT:3,PROT:3,ALBUMIN:3 in the last 168 hours No results found for this basename: LIPASE:3,AMYLASE:3 in the last 168 hours No results found for this basename: AMMONIA:3 in the last 168 hours CBC:  Lab 03/11/12 0550 03/09/12 2331  WBC 7.0 8.1  NEUTROABS 4.6 5.5  HGB 10.3* 9.8*  HCT 29.9* 28.6*  MCV 94.0 95.3  PLT 190 173   Cardiac Enzymes:  Lab 03/09/12 2331    CKTOTAL --  CKMB --  CKMBINDEX --  TROPONINI <0.30   magnesium: 1.9  CBG: No results found for this basename: GLUCAP:5 in the last 168 hours  Iron Studies: No results found for this basename: IRON,TIBC,TRANSFERRIN,FERRITIN in the last 72 hours Studies/Results: No results found. Medications:      . allopurinol  150 mg Oral Daily  . aspirin EC  81 mg Oral Daily  . atorvastatin  20 mg Oral BID  . carvedilol  6.25 mg Oral Q12H  . ceFEPIme (MAXIPIME) 1 GM IVPB  1 g Intravenous Q12H  . citalopram  10 mg Oral QHS  . feeding supplement  30 mL Oral QAC supper  . feeding supplement  1 Container Oral BID BM  . furosemide  40 mg Oral Daily  . gabapentin  600 mg Oral QHS  . heparin  5,000 Units Subcutaneous Q8H  . lisinopril  20 mg Oral Daily  . metroNIDAZOLE  500 mg Oral Q8H  . potassium chloride  40 mEq Oral Daily  . saccharomyces boulardii  250 mg Oral BID  . sodium chloride  3 mL Intravenous Q12H  . Tamsulosin HCl  0.4 mg Oral Daily  . valACYclovir  500 mg Oral Daily  . DISCONTD: furosemide  40 mg Intravenous Q12H    I  have reviewed scheduled and prn medications.     Problem/Plan: Principal Problem:  *Acute on chronic systolic congestive heart failure Active Problems:  Multiple myeloma  Hyponatremia  Open wound of left foot  Hypokalemia  1. C. difficile colitis: Discussed with infectious disease and started oral Flagyl which he is to take through the course of IV cefepime, for 14 days. 2. Dyspnea likely secondary to acute on chronic systolic congestive heart failure in the context of recent fluid resuscitation during surgery. Clinically improved. Will change Lasix to by mouth. Monitor volume status closely since patient having diarrhea. Continue lisinopril and Coreg. We'll repeat 2-D echocardiogram to check LV function. 3. Hypokalemia: Repleted. Follow BMP tomorrow 4. Anemia: Stable. 5. Multiple myeloma: Outpatient followup with oncology.  6. CAD, status post CABG,  ischemic cardiomyopathy with LVEF 30-35%: Continue lisinopril and Coreg. Followup repeat 2-D echo. 7. Altered mental status: Resolved. Unclear etiology.? Secondary to Ativan. Dilaudid withdrawal seems less likely because patient takes it infrequently. 8. Hypertension: Coreg increased. Continue lisinopril. Monitor. 9. Left foot wound status post resection of first ray 03/06/12. IVPB cefepime 1 g every 12 hours to day 42 from his surgery (04/17/12). Outpatient followup with Dr. Johny Sax. Outpatient followup with Dr. Rudi Coco an appointment for 03/14/12 which may have to be rescheduled.  Discussed with patients spouse and extended family at bedside and updated care.  Discussed with patient's primary cardiologist [name]. Patient to followup in his office on discharge.  Disposition: Discharge when diarrhea improves.    Timothy [name] 03/12/2012,5:47 PM  LOS: 3 days

## 2012-03-12 NOTE — Progress Notes (Signed)
INITIAL ADULT NUTRITION ASSESSMENT Date: 03/12/2012   Time: 2:21 PM Reason for Assessment: Consult for assessment of nutrition status  ASSESSMENT: Male 78 y.o.  Dx: Acute on chronic systolic congestive heart failure  Hx:  Past Medical History  Diagnosis Date  . ASCVD (arteriosclerotic cardiovascular disease)   . Anemia     chronic mild anemia  . Gout   . Hypertension   . Lumbar disc disease     post laminectomy  . Chronic back pain   . History of echocardiogram 11/02/2010    EF range of 30 to 35% / There is hypokinsesis of the basal-mild inferolateral myocardium  . CHF (congestive heart failure)   . Heart murmur   . Ischemic cardiomyopathy   . Renal insufficiency   . Peripheral neuropathy   . Inferior myocardial infarction 1986  . Hyperlipidemia   . SOB (shortness of breath)   . Fatigue   . Peripheral neuropathy   . Coronary artery disease     CABG 2002  . Blood transfusion     DEC 2012 - TWO UNITS  . 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  PT HAS BEEN OFF CHEMO SINCE HIS HOSPITALIZATION FEB 2013 FOR LEFT FOOT INFECTION  . Cancer   . History of shingles MARCH 2009    SHINGLE LESIONS WERE AROUND RIGHT EYE--PT HAS RESIDUAL ITCHING AROUND THE EYE.    Related Meds:  Scheduled Meds:   . allopurinol  150 mg Oral Daily  . aspirin EC  81 mg Oral Daily  . atorvastatin  20 mg Oral BID  . carvedilol  6.25 mg Oral Q12H  . ceFEPIme (MAXIPIME) 1 GM IVPB  1 g Intravenous Q12H  . citalopram  10 mg Oral QHS  . furosemide  40 mg Oral Daily  . gabapentin  600 mg Oral QHS  . heparin  5,000 Units Subcutaneous Q8H  . lisinopril  20 mg Oral Daily  . metroNIDAZOLE  500 mg Oral Q8H  . potassium chloride  40 mEq Oral Q4H  . potassium chloride  40 mEq Oral Daily  . saccharomyces boulardii  250 mg Oral BID  . sodium chloride  3 mL Intravenous Q12H  . Tamsulosin HCl  0.4 mg Oral Daily  . valACYclovir  500 mg Oral Daily  .  DISCONTD: furosemide  40 mg Intravenous Q12H   Continuous Infusions:  PRN Meds:.sodium chloride, acetaminophen, HYDROmorphone, ondansetron (ZOFRAN) IV, sodium chloride, sodium chloride   Ht: 5\' 10"  (177.8 cm)  Wt: 160 lb 7.9 oz (72.8 kg) (bedscale)  Ideal Wt: 75.45 kg % Ideal Wt: 96.3%  Usual Wt: 174 lb. In February 2013.  % Usual Wt: 91.9%  *14 lb weight loss over the past 2 months (8% weight loss from baseline)  Body mass index is 23.03 kg/(m^2). (WNL)  Food/Nutrition Related Hx: Patient reports poor appetite. He has been experiencing a poor appetite since before his surgery on 03/06/12. Per patient and his wife he has been eating poorly, about 30% at meals for the past several weeks. He stated he just does not feel hungry and has nausea frequently. He reported diarrhea when he consumes milk products. Patient's wife stated his snack preferences. Noted patient with left foot wound. PO intake documented 0-75% at meals.  Labs:  CMP     Component Value Date/Time   NA 134* 03/12/2012 0520   K 3.7 03/12/2012 0520   CL 97 03/12/2012 0520   CO2 27 03/12/2012  0520   GLUCOSE 90 03/12/2012 0520   BUN 14 03/12/2012 0520   CREATININE 1.00 03/12/2012 0520   CALCIUM 9.0 03/12/2012 0520   PROT 6.7 03/01/2012 1610   ALBUMIN 3.1* 03/01/2012 1610   AST 32 03/01/2012 1610   ALT 15 03/01/2012 1610   ALKPHOS 59 03/01/2012 1610   BILITOT 0.6 03/01/2012 1610   GFRNONAA 68* 03/12/2012 0520   GFRAA 79* 03/12/2012 0520    Intake/Output Summary (Last 24 hours) at 03/12/12 1425 Last data filed at 03/12/12 1413  Gross per 24 hour  Intake    340 ml  Output   1800 ml  Net  -1460 ml     Diet Order: Cardiac  Supplements/Tube Feeding: none at this time  IVF:    Estimated Nutritional Needs:   Kcal: 4540-9811 Protein: 109-145.45 grams  Fluid: 1 ml per kcal  NUTRITION DIAGNOSIS: -Inadequate oral intake (NI-2.1).  Status: Ongoing  -Increased nutrient needs r/t wound healing and unintentional weight loss  a/e/b 14 lb. Weight loss over 2 months and patient with left foot wound. Status: Ongoing.   RELATED TO: poor appetite and cancer treatment related symptoms  AS EVIDENCE BY: patient reports poor appetite, nausea, and poor PO intake of 30% at meals over the past several weeks.   MONITORING/EVALUATION(Goals): PO intake, weight trends, labs, skin 1. PO intake > 75% at meals and supplements 2. Meet > 90% of estimated energy needs 3. Minimize weight loss  EDUCATION NEEDS: -No education needs identified at this time  INTERVENTION: 1. Discussed symptom management strategies for nausea and for increasing caloric and protein intake.  2. Will order resource breeze nutrition supplement BID. Provides 500 kcal and 18 grams of protein. 3. Will order Prostat once daily. Provides 100 kcal and 15 grams of protein. 4. Will order patient snacks BID to promote increased PO intake.  5. RD to follow for nutrition plan of care.   Dietitian [phone]  DOCUMENTATION CODES Per approved criteria  -Severe malnutrition in the context of chronic illness  Patient meets criteria for severe malnutrition in the context of chronic illness due to PO intake < or equal to 75% of estimated energy requirements for > or equal to 1 month and unintentional weight loss of > 5% over 1 month.   Iven Finn [facility] 03/12/2012, 2:21 PM

## 2012-03-12 NOTE — Progress Notes (Signed)
INFECTIOUS DISEASE PROGRESS NOTE  ID: Timothy [name] is a 78 y.o. male with   Principal Problem:  *Acute on chronic systolic congestive heart failure Active Problems:  Multiple myeloma  Hyponatremia  Open wound of left foot  Hypokalemia  Subjective: >5 loose stools overnight. Eating poorly. Per wife his MS is back to baseline.   Abtx:  Anti-infectives     Start     Dose/Rate Route Frequency Ordered Stop   03/12/12 0945   metroNIDAZOLE (FLAGYL) tablet 500 mg        500 mg Oral 3 times per day 03/12/12 0932 03/26/12 0559   03/10/12 1000   valACYclovir (VALTREX) tablet 500 mg        500 mg Oral Daily 03/10/12 0658     03/10/12 0800   ceFEPIme (MAXIPIME) 1 g in dextrose 5 % 50 mL IVPB        1 g 100 mL/hr over 30 Minutes Intravenous Every 12 hours 03/10/12 0658            Medications:  Scheduled:   . allopurinol  150 mg Oral Daily  . aspirin EC  81 mg Oral Daily  . atorvastatin  20 mg Oral BID  . carvedilol  6.25 mg Oral Q12H  . ceFEPIme (MAXIPIME) 1 GM IVPB  1 g Intravenous Q12H  . citalopram  10 mg Oral QHS  . furosemide  40 mg Intravenous Q12H  . gabapentin  600 mg Oral QHS  . heparin  5,000 Units Subcutaneous Q8H  . lisinopril  20 mg Oral Daily  . magnesium sulfate 1 - 4 g bolus IVPB  2 g Intravenous Once  . metroNIDAZOLE  500 mg Oral Q8H  . potassium chloride  40 mEq Oral Q4H  . sodium chloride  3 mL Intravenous Q12H  . Tamsulosin HCl  0.4 mg Oral Daily  . valACYclovir  500 mg Oral Daily    Objective: Vital signs in last 24 hours: Temp:  [97.8 F (36.6 C)-98 F (36.7 C)] 98 F (36.7 C) (04/23 0535) Pulse Rate:  [75-83] 83  (04/23 0535) Resp:  [18] 18  (04/23 0535) BP: (101-134)/(58-82) 134/78 mmHg (04/23 0535) SpO2:  [94 %-97 %] 97 % (04/23 0535) Weight:  [72.8 kg (160 lb 7.9 oz)] 72.8 kg (160 lb 7.9 oz) (04/23 0535)   General appearance: alert, cooperative and no distress Resp: clear to auscultation bilaterally Cardio: regular rate and  rhythm GI: normal findings: bowel sounds normal and soft, non-tender and abnormal findings:  distended Incision/Wound: LLE dressed. portion of wound that is visible is clean, well healing.   Lab Results  Basename 03/12/12 0520 03/11/12 0550 03/09/12 2331  WBC -- 7.0 8.1  HGB -- 10.3* 9.8*  HCT -- 29.9* 28.6*  NA 134* 136 --  K 3.7 3.1* --  CL 97 98 --  CO2 27 29 --  BUN 14 11 --  CREATININE 1.00 0.82 --  GLU -- -- --   Liver Panel No results found for this basename: PROT:2,ALBUMIN:2,AST:2,ALT:2,ALKPHOS:2,BILITOT:2,BILIDIR:2,IBILI:2 in the last 72 hours Sedimentation Rate No results found for this basename: ESRSEDRATE in the last 72 hours C-Reactive Protein No results found for this basename: CRP:2 in the last 72 hours  Microbiology: Recent Results (from the past 240 hour(s))  WOUND CULTURE     Status: Normal   Collection Time   03/06/12  1:30 PM      Component Value Range Status Comment   Specimen Description FOOT LEFT INFECTED   Final  Special Requests NONE   Final    Gram Stain     Final    Value: NO WBC SEEN     NO SQUAMOUS EPITHELIAL CELLS SEEN     NO ORGANISMS SEEN   Culture RARE PSEUDOMONAS AERUGINOSA   Final    Report Status 03/09/2012 FINAL   Final    Organism ID, Bacteria PSEUDOMONAS AERUGINOSA   Final   ANAEROBIC CULTURE     Status: Normal   Collection Time   03/06/12  1:30 PM      Component Value Range Status Comment   Specimen Description FOOT LEFT INFECTED   Final    Special Requests NONE   Final    Gram Stain     Final    Value: NO WBC SEEN     NO SQUAMOUS EPITHELIAL CELLS SEEN     NO ORGANISMS SEEN   Culture NO ANAEROBES ISOLATED   Final    Report Status 03/11/2012 FINAL   Final   URINE CULTURE     Status: Normal   Collection Time   03/10/12  8:06 PM      Component Value Range Status Comment   Specimen Description URINE, RANDOM   Final    Special Requests NONE   Final    Culture  Setup Time [id number]   Final    Colony Count NO GROWTH   Final     Culture NO GROWTH   Final    Report Status 03/12/2012 FINAL   Final   CLOSTRIDIUM DIFFICILE BY PCR     Status: Abnormal   Collection Time   03/11/12  8:04 PM      Component Value Range Status Comment   C difficile by pcr POSITIVE (*) NEGATIVE  Final     Studies/Results: No results found.   Assessment/Plan: 78 yo M with hx of multiple myeloma, osteomyelitis of his L foot, ulcer of his L foot  Resection of 1st ray 03-06-12  Cx: P Aeruginosa (R-Cipro)  CHF  C diff Would- continue flagyl, at least 2 weeks, possible for duration of cefepime.  continue his cefepime 1g IVPB q12 hours, to day 42 from his surgery (04-17-12).  nutrition eval PT eval   Johny Sax Infectious Diseases [phone] 03/12/2012, 11:32 AM   LOS: 3 days

## 2012-03-13 ENCOUNTER — Encounter (HOSPITAL_BASED_OUTPATIENT_CLINIC_OR_DEPARTMENT_OTHER): Payer: Medicare Other

## 2012-03-13 DIAGNOSIS — I059 Rheumatic mitral valve disease, unspecified: Secondary | ICD-10-CM

## 2012-03-13 LAB — BASIC METABOLIC PANEL
BUN: 22 mg/dL (ref 6–23)
CO2: 25 mEq/L (ref 19–32)
Chloride: 102 mEq/L (ref 96–112)
GFR calc non Af Amer: 66 mL/min — ABNORMAL LOW (ref >90)
Glucose, Bld: 92 mg/dL (ref 70–99)
Potassium: 3.5 mEq/L (ref 3.5–5.1)
Sodium: 135 mEq/L (ref 135–145)

## 2012-03-13 MED ORDER — MIRTAZAPINE 7.5 MG PO TABS
7.5000 mg | ORAL_TABLET | Freq: Every day | ORAL | Status: DC
  Administered 2012-03-13 – 2012-03-15 (×3): 7.5 mg via ORAL
  Filled 2012-03-13 (×4): qty 1

## 2012-03-13 NOTE — Progress Notes (Signed)
PT/OT/ST Cancellation Note  ___Treatment cancelled today due to medical issues with patient which prohibited therapy  ___ Treatment cancelled today due to patient receiving procedure or test   ___ Treatment cancelled today due to patient's refusal to participate   __x_ Treatment cancelled this am at pt's request. Pt requested PT check back later. Will check back as schedule permits. Thanks.   Signature:  Rebeca Alert, PT [phone]

## 2012-03-13 NOTE — Progress Notes (Signed)
  Echocardiogram 2D Echocardiogram has been performed.  Timothy [name] A 03/13/2012, 12:32 PM

## 2012-03-13 NOTE — Discharge Instructions (Signed)
Ambulate Wgt Bearing as tolerated on the left foot wearing the boot and using a walker. Elevate your left foot as much as possible. Change the dressing every 3 days with a dry dressing.

## 2012-03-13 NOTE — Evaluation (Signed)
Physical Therapy Evaluation Patient Details Name: Timothy [name] MRN: [id number] DOB: 1928-09-05 Today's Date: 03/13/2012 Time: 4098-1191 PT Time Calculation (min): 43 min  PT Assessment / Plan / Recommendation Clinical Impression  Pt presents with diagnosis of CHF,dyspnea and C-diff. S/P L foot 1st ray amputation 03/06/12. Hx of multiple myeloma, osteomyelitis. Pt recently discharged from [facility]. Pt will benefit from skilled PT to improve general strength, activity tolerance, safety and technique with transfers and  gait.     PT Assessment  Patient needs continued PT services    Follow Up Recommendations  Home health PT;Supervision/Assistance - 24 hour    Equipment Recommendations  None recommended by PT    Frequency Min 3X/week    Precautions / Restrictions Precautions Precautions: Fall Restrictions Weight Bearing Restrictions: Yes LLE Weight Bearing: Touchdown weight bearing   Pertinent Vitals/Pain       Mobility  Bed Mobility Bed Mobility: Supine to Sit;Sit to Supine Supine to Sit: 4: Min guard;With rails;HOB elevated Sit to Supine: 4: Min assist Details for Bed Mobility Assistance: Increased time. Assist for L LE onto bed and for final positioning in bed Transfers Transfers: Sit to Stand;Stand to Sit Sit to Stand: 3: Mod assist;From bed;From chair/3-in-1;With upper extremity assist;With armrests Stand to Sit: To bed;To chair/3-in-1;With upper extremity assist;With armrests Stand Pivot Transfers: 3: Mod assist;From elevated surface;With armrests Details for Transfer Assistance: Assist to rise, stabilize. VCs safety, technique, hand placement, posture. MAX verbal cues for adherence to TDWB status.  Ambulation/Gait Ambulation/Gait Assistance: 3: Mod assist Ambulation Distance (Feet): 5 Feet Assistive device: Rolling walker Ambulation/Gait Assistance Details: VCs safety, technique, posture, sequence, adherence to WBing. Fatigues easily. MAX cues for adherence to TDWB  status.  Gait Pattern: Step-to pattern;Decreased step length - left;Decreased step length - right;Decreased stride length;Trunk flexed    Exercises     PT Goals Acute Rehab PT Goals PT Goal Formulation: With patient/family Time For Goal Achievement: 03/27/12 Potential to Achieve Goals: Good Pt will go Supine/Side to Sit: with supervision PT Goal: Supine/Side to Sit - Progress: Goal set today Pt will go Sit to Supine/Side: with supervision PT Goal: Sit to Supine/Side - Progress: Goal set today Pt will go Sit to Stand: with min assist PT Goal: Sit to Stand - Progress: Goal set today Pt will Transfer Bed to Chair/Chair to Bed: with min assist PT Transfer Goal: Bed to Chair/Chair to Bed - Progress: Goal set today Pt will Ambulate: 1 - 15 feet;with min assist;with rolling walker PT Goal: Ambulate - Progress: Goal set today  Visit Information  Last PT Received On: 03/13/12 Assistance Needed: +1    Subjective Data  Subjective: "I've gotten so weak" Patient Stated Goal: Home with family assist   Prior Functioning  Home Living Lives With: Spouse;Other (Comment) (nephew) Available Help at Discharge: Family Type of Home: House Home Access: Stairs to enter [employer] of Steps: 1 Entrance Stairs-Rails: None Home Layout: Two level;Able to live on main level with bedroom/bathroom Bathroom Shower/Tub: Health visitor: Handicapped height Bathroom Accessibility: Yes How Accessible: Accessible via wheelchair Home Adaptive Equipment: Bedside commode/3-in-1;Walker - rolling;Wheelchair - manual Prior Function Level of Independence: Needs assistance Needs Assistance: Dressing;Bathing;Meal Prep Communication Communication: No difficulties    Cognition  Overall Cognitive Status: Appears within functional limits for tasks assessed/performed Arousal/Alertness: Awake/alert Orientation Level: Appears intact for tasks assessed Behavior During Session: [facility] for tasks  performed    Extremity/Trunk Assessment Right Lower Extremity Assessment RLE ROM/Strength/Tone: Deficits RLE ROM/Strength/Tone Deficits: Strength 3+/5 RLE Sensation:  WFL - Light Touch RLE Coordination: WFL - gross/fine motor Left Lower Extremity Assessment LLE ROM/Strength/Tone: Deficits LLE ROM/Strength/Tone Deficits: cast lower leg. Strength at least 3/5 except ankle NT Trunk Assessment Trunk Assessment: Normal   Balance Balance Balance Assessed: Yes Static Standing Balance Static Standing - Balance Support: Bilateral upper extremity supported Static Standing - Level of Assistance: 3: Mod assist Static Standing - Comment/# of Minutes: LOB posterioly x 1 with static standing-Mod A to correct.   End of Session PT - End of Session Equipment Utilized During Treatment: Gait belt Activity Tolerance: Patient limited by fatigue Patient left: in bed;with call bell/phone within reach;with family/visitor present   Rebeca Alert [facility] 03/13/2012, 2:57 PM [phone]

## 2012-03-13 NOTE — Progress Notes (Signed)
Subjective:   Diarrhea continues; today he had about 5 episodes. Patient reports increased BM with the use of feeding supplements. Afebrile and otherwise doing and feeling ok. No abdominal pain or nausea or vomiting. No dyspnea or CP.  Objective  Vital signs in last 24 hours: Filed Vitals:   03/13/12 0612 03/13/12 0950 03/13/12 1410 03/13/12 1716  BP: 115/63 128/73 115/68   Pulse: 74 73 84   Temp: 98.2 F (36.8 C)  98.4 F (36.9 C)   TempSrc: Oral  Oral   Resp: 18  18   Height:      Weight:    76.431 kg (168 lb 8 oz)  SpO2: 95%      Weight change:   Intake/Output Summary (Last 24 hours) at 03/13/12 1857 Last data filed at 03/13/12 1700  Gross per 24 hour  Intake    110 ml  Output    775 ml  Net   -665 ml    Physical Exam:  General Exam: Comfortable.  Respiratory System: Clear to auscultation. No increased work of breathing.  Cardiovascular System: First and second heart sounds heard. Regular rate and rhythm. No JVD/murmurs.  Gastrointestinal System: Abdomen is non distended, soft and normal bowel sounds heard.  Central Nervous System: Alert and oriented. No focal neurological deficits. Extremities: Trace bilateral leg edema. Symmetric 5 x 5 power. Left foot dressing clean dry and intact.  Labs:  Basic Metabolic Panel:  Lab 03/13/12 4259 03/12/12 0520 03/11/12 0550  NA 135 134* 136  K 3.5 3.7 3.1*  CL 102 97 98  CO2 25 27 29   GLUCOSE 92 90 95  BUN 22 14 11   CREATININE 1.03 1.00 0.82  CALCIUM 9.0 9.0 8.3*  ALB -- -- --  PHOS -- -- --   CBC:  Lab 03/11/12 0550 03/09/12 2331  WBC 7.0 8.1  NEUTROABS 4.6 5.5  HGB 10.3* 9.8*  HCT 29.9* 28.6*  MCV 94.0 95.3  PLT 190 173   Cardiac Enzymes:  Lab 03/09/12 2331  CKTOTAL --  CKMB --  CKMBINDEX --  TROPONINI <0.30   magnesium: 1.9  Studies/Results: No results found. Medications:      . allopurinol  150 mg Oral Daily  . aspirin EC  81 mg Oral Daily  . atorvastatin  20 mg Oral BID  . carvedilol  6.25  mg Oral Q12H  . ceFEPIme (MAXIPIME) 1 GM IVPB  1 g Intravenous Q12H  . citalopram  10 mg Oral QHS  . furosemide  40 mg Oral Daily  . gabapentin  600 mg Oral QHS  . heparin  5,000 Units Subcutaneous Q8H  . lisinopril  20 mg Oral Daily  . metroNIDAZOLE  500 mg Oral Q8H  . mirtazapine  7.5 mg Oral QHS  . potassium chloride  40 mEq Oral Daily  . saccharomyces boulardii  250 mg Oral BID  . sodium chloride  3 mL Intravenous Q12H  . Tamsulosin HCl  0.4 mg Oral Daily  . valACYclovir  500 mg Oral Daily  . DISCONTD: feeding supplement  30 mL Oral QAC supper  . DISCONTD: feeding supplement  1 Container Oral BID BM    Problem/Plan: Principal Problem:  *Acute on chronic systolic congestive heart failure Active Problems:  Multiple myeloma  Hyponatremia  Open wound of left foot  Hypokalemia  1. Diarrhea: with concerns for C. Diff colitis. After discussing with ID; plan is to add flagyl to his current antibiotic therapy and will extended until he finish cefepime (Apr 17, 2012). 2. Dyspnea likely secondary to acute on chronic systolic congestive heart failure in the context of recent fluid resuscitation during surgery. Clinically improved. Will continue Lasix PO. Close I's and O's. Continue lisinopril and Coreg. 2-D echocardiogram demonstarted EF of 30%; no changes to be done to current regimen. Patient already on ACE inhibitors. 3. Hypokalemia: Repleted. Will Follow BMP in am (since diarrhea continues) 4. Anemia: Stable. 5. Multiple myeloma: Outpatient followup with oncology.  6. CAD, status post CABG, ischemic cardiomyopathy with LVEF 30-35%: Continue lisinopril and Coreg.  7. Altered mental status: Resolved. Unclear etiology.? Secondary to Ativan and also metabolic disturbances. 8. Hypertension: BP better now; will continue current regimen. 9. Left foot wound status post resection of first ray 03/06/12. IVPB cefepime 1 g every 12 hours to day 42 from his surgery (04/17/12). Outpatient followup  with Dr. Johny Sax. Outpatient followup with Dr. Jodi Geralds in 1 week. Ortho PA came today and changed dressings and provided further instructions for wound care. Will follow recommendations. 10. Hypoalbuminemia and protein calorie malnutrition: patient having diarrhea with feeding supplements; will hold them for now. Will start remeron 7.5mg  daily to increase appetite. 11. Depression: continue celexa; will also add remeron (mainly for appetite stimulation) but will also help depression.   Disposition: Discharge when diarrhea improves.    Ludivina Guymon 03/13/2012,6:57 PM  LOS: 4 days

## 2012-03-13 NOTE — Progress Notes (Signed)
INFECTIOUS DISEASE PROGRESS NOTE  ID: Timothy [name] is a 78 y.o. male with   Principal Problem:  *Acute on chronic systolic congestive heart failure Active Problems:  Multiple myeloma  Hyponatremia  Open wound of left foot  Hypokalemia  Subjective: No appetite.  Increased loose BM after drinking protein drink.   Abtx:  Anti-infectives     Start     Dose/Rate Route Frequency Ordered Stop   03/12/12 0945   metroNIDAZOLE (FLAGYL) tablet 500 mg        500 mg Oral 3 times per day 03/12/12 0932 03/26/12 0559   03/10/12 1000   valACYclovir (VALTREX) tablet 500 mg        500 mg Oral Daily 03/10/12 0658     03/10/12 0800   ceFEPIme (MAXIPIME) 1 g in dextrose 5 % 50 mL IVPB        1 g 100 mL/hr over 30 Minutes Intravenous Every 12 hours 03/10/12 0658            Medications:  Scheduled:   . allopurinol  150 mg Oral Daily  . aspirin EC  81 mg Oral Daily  . atorvastatin  20 mg Oral BID  . carvedilol  6.25 mg Oral Q12H  . ceFEPIme (MAXIPIME) 1 GM IVPB  1 g Intravenous Q12H  . citalopram  10 mg Oral QHS  . furosemide  40 mg Oral Daily  . gabapentin  600 mg Oral QHS  . heparin  5,000 Units Subcutaneous Q8H  . lisinopril  20 mg Oral Daily  . metroNIDAZOLE  500 mg Oral Q8H  . mirtazapine  7.5 mg Oral QHS  . potassium chloride  40 mEq Oral Daily  . saccharomyces boulardii  250 mg Oral BID  . sodium chloride  3 mL Intravenous Q12H  . Tamsulosin HCl  0.4 mg Oral Daily  . valACYclovir  500 mg Oral Daily  . DISCONTD: feeding supplement  30 mL Oral QAC supper  . DISCONTD: feeding supplement  1 Container Oral BID BM    Objective: Vital signs in last 24 hours: Temp:  [98.2 F (36.8 C)-99 F (37.2 C)] 98.4 F (36.9 C) (04/24 1410) Pulse Rate:  [73-84] 84  (04/24 1410) Resp:  [18] 18  (04/24 1410) BP: (115-144)/(63-89) 115/68 mmHg (04/24 1410) SpO2:  [95 %] 95 % (04/24 0612)   General appearance: alert, cooperative, no distress and pale Resp: clear to auscultation  bilaterally Cardio: regular rate and rhythm GI: normal findings: bowel sounds normal and soft, non-tender Incision/Wound:LLE wrapped.   Lab Results  Basename 03/13/12 0510 03/12/12 0520 03/11/12 0550  WBC -- -- 7.0  HGB -- -- 10.3*  HCT -- -- 29.9*  NA 135 134* --  K 3.5 3.7 --  CL 102 97 --  CO2 25 27 --  BUN 22 14 --  CREATININE 1.03 1.00 --  GLU -- -- --   Liver Panel No results found for this basename: PROT:2,ALBUMIN:2,AST:2,ALT:2,ALKPHOS:2,BILITOT:2,BILIDIR:2,IBILI:2 in the last 72 hours Sedimentation Rate No results found for this basename: ESRSEDRATE in the last 72 hours C-Reactive Protein No results found for this basename: CRP:2 in the last 72 hours  Microbiology: Recent Results (from the past 240 hour(s))  WOUND CULTURE     Status: Normal   Collection Time   03/06/12  1:30 PM      Component Value Range Status Comment   Specimen Description FOOT LEFT INFECTED   Final    Special Requests NONE   Final    Gram  Stain     Final    Value: NO WBC SEEN     NO SQUAMOUS EPITHELIAL CELLS SEEN     NO ORGANISMS SEEN   Culture RARE PSEUDOMONAS AERUGINOSA   Final    Report Status 03/09/2012 FINAL   Final    Organism ID, Bacteria PSEUDOMONAS AERUGINOSA   Final   ANAEROBIC CULTURE     Status: Normal   Collection Time   03/06/12  1:30 PM      Component Value Range Status Comment   Specimen Description FOOT LEFT INFECTED   Final    Special Requests NONE   Final    Gram Stain     Final    Value: NO WBC SEEN     NO SQUAMOUS EPITHELIAL CELLS SEEN     NO ORGANISMS SEEN   Culture NO ANAEROBES ISOLATED   Final    Report Status 03/11/2012 FINAL   Final   URINE CULTURE     Status: Normal   Collection Time   03/10/12  8:06 PM      Component Value Range Status Comment   Specimen Description URINE, RANDOM   Final    Special Requests NONE   Final    Culture  Setup Time [id number]   Final    Colony Count NO GROWTH   Final    Culture NO GROWTH   Final    Report Status 03/12/2012  FINAL   Final   CLOSTRIDIUM DIFFICILE BY PCR     Status: Abnormal   Collection Time   03/11/12  8:04 PM      Component Value Range Status Comment   C difficile by pcr POSITIVE (*) NEGATIVE  Final     Studies/Results: No results found.   Assessment/Plan: 79 yo M with hx of multiple myeloma, osteomyelitis of his L foot, ulcer of his L foot  Resection of 1st ray 03-06-12  Cx: P Aeruginosa (R-Cipro)  CHF  C diff  Would- continue flagyl, at least 2 weeks, at least until he has ID clinic f/u and possibly for duration of cefepime.  continue his cefepime 1g IVPB q12 hours, to day 42 from his surgery (04-17-12). May need to shorten this if his loose BM continue to be a problem.  Improve nutrition, appetite stimulant    Timothy [name] Infectious Diseases [phone] 03/13/2012, 4:50 PM   LOS: 4 days

## 2012-03-13 NOTE — Progress Notes (Signed)
Subjective: No c/o's related to left foot. Still has diarrhea. Getting PT  Objective: Vital signs in last 24 hours: Temp:  [98.2 F (36.8 C)-99 F (37.2 C)] 98.4 F (36.9 C) (04/24 1410) Pulse Rate:  [73-84] 84  (04/24 1410) Resp:  [18] 18  (04/24 1410) BP: (115-144)/(63-89) 115/68 mmHg (04/24 1410) SpO2:  [95 %] 95 % (04/24 0612)  Intake/Output from previous day: 04/23 0701 - 04/24 0700 In: 340 [P.O.:240; IV Piggyback:100] Out: 1250 [Urine:1250] Intake/Output this shift: Total I/O In: 10 [I.V.:10] Out: -    Basename 03/11/12 0550  HGB 10.3*    Basename 03/11/12 0550  WBC 7.0  RBC 3.18*  HCT 29.9*  PLT 190    Basename 03/13/12 0510 03/12/12 0520  NA 135 134*  K 3.5 3.7  CL 102 97  CO2 25 27  BUN 22 14  CREATININE 1.03 1.00  GLUCOSE 92 90  CALCIUM 9.0 9.0   Left foot exam: Neurovascular intact Sensation intact distally Incision: no drainage No cellulitis present Wound looks great.  Assessment/Plan: 1. 7 days s/p left 1st ray amputation,doing well. 2. C-diff colitis    tx with Flagyl per med service.  Plan: Dressing changed. Fitted with short Cam Boot. He may remove this in bed. Can increase WB status to WBAT on Left with walker. Needs to wear Cam Boot when up. Cont IV antibiotics as previously planned. Will need dressing changed every 3 days(family can do this). Follow up with Dr Luiz Blare in 1 week.    Haelee Bolen G 03/13/2012, 3:45 PM

## 2012-03-14 LAB — BASIC METABOLIC PANEL
BUN: 20 mg/dL (ref 6–23)
CO2: 23 mEq/L (ref 19–32)
Chloride: 101 mEq/L (ref 96–112)
Creatinine, Ser: 1.03 mg/dL (ref 0.50–1.35)
Glucose, Bld: 87 mg/dL (ref 70–99)
Potassium: 3.6 mEq/L (ref 3.5–5.1)

## 2012-03-14 MED ORDER — TRAZODONE HCL 100 MG PO TABS
100.0000 mg | ORAL_TABLET | Freq: Every evening | ORAL | Status: DC | PRN
  Administered 2012-03-14: 100 mg via ORAL
  Filled 2012-03-14: qty 1

## 2012-03-14 NOTE — Progress Notes (Signed)
Physical Therapy Treatment Patient Details Name: Timothy [name] MRN: [id number] DOB: 1928/03/08 Today's Date: 03/14/2012 Time: 4098-1191 PT Time Calculation (min): 34 min  PT Assessment / Plan / Recommendation Comments on Treatment Session  Pt now able to bear weight-WBAT-on L foot. Assisted with donning of CAM boot. Pt still has posterior bias, especially with transitions. Possible d/c home tomorrow. Recommended pt/wife limit ambulation in home until HHPT works with pt and wife on safety, technique. Recommended wheelchair use for longer distances for now.     Follow Up Recommendations  Home health PT;Supervision/Assistance - 24 hour    Equipment Recommendations  None recommended by PT    Frequency     Plan Discharge plan remains appropriate    Precautions / Restrictions Precautions Precautions: Fall Restrictions Weight Bearing Restrictions: No LLE Weight Bearing: Weight bearing as tolerated Other Position/Activity Restrictions: WBAT with CAM boot only   Pertinent Vitals/Pain     Mobility  Bed Mobility Bed Mobility: Supine to Sit Supine to Sit: 4: Min assist Details for Bed Mobility Assistance: Assist for trunk to upright. Pt with LOB posteriorly x 1.  Transfers Transfers: Sit to Stand;Stand to Sit Sit to Stand: 3: Mod assist;From elevated surface;With upper extremity assist;From bed Stand to Sit: To chair/3-in-1;With upper extremity assist;With armrests;4: Min assist Details for Transfer Assistance: Assist to rise, stabilize. VCS safety, technique, hand placement, posture. Difficutly with anterior weight-shifting.  Ambulation/Gait Ambulation/Gait Assistance: 4: Min assist Ambulation Distance (Feet): 40 Feet Assistive device: Rolling walker Ambulation/Gait Assistance Details: VCs safety, technique, sequence, posture, safe use of RW. Fatigues easily. Assist ot stabilize throughout ambulation Gait Pattern: Step-to pattern;Decreased step length - right;Decreased step length -  left;Decreased stride length;Trunk flexed    Exercises     PT Goals Acute Rehab PT Goals PT Goal: Supine/Side to Sit - Progress: Progressing toward goal PT Goal: Sit to Stand - Progress: Progressing toward goal Pt will Ambulate: 16 - 50 feet;with min assist;with rolling walker PT Goal: Ambulate - Progress: Updated due to goal met  Visit Information  Last PT Received On: 03/14/12 Assistance Needed: +1    Subjective Data  Subjective: "My balance isn't good" Patient Stated Goal: Home   Cognition  Overall Cognitive Status: Appears within functional limits for tasks assessed/performed Arousal/Alertness: Awake/alert Orientation Level: Appears intact for tasks assessed Behavior During Session: [facility] for tasks performed    Balance     End of Session PT - End of Session Equipment Utilized During Treatment: Gait belt Activity Tolerance: Patient limited by fatigue Patient left: in chair;with call bell/phone within reach;with family/visitor present Nurse Communication: Mobility status    Rebeca Alert [facility] 03/14/2012, 3:43 PM [phone]

## 2012-03-14 NOTE — Progress Notes (Signed)
Subjective:   Diarrhea improving (about 3 episodes today and more form). C. Diff by PCR positive. Afebrile and otherwise doing and feeling ok. No abdominal pain or nausea or vomiting. No dyspnea or CP.  Objective  Vital signs in last 24 hours: Filed Vitals:   03/13/12 1716 03/13/12 2243 03/14/12 0555 03/14/12 1435  BP:  146/68 127/72 125/66  Pulse:  64 66 70  Temp:  97.9 F (36.6 C) 97.1 F (36.2 C) 97.7 F (36.5 C)  TempSrc:  Oral Oral Oral  Resp:  19 19 18   Height:      Weight: 76.431 kg (168 lb 8 oz)     SpO2:  96% 97% 97%   Weight change:   Intake/Output Summary (Last 24 hours) at 03/14/12 1846 Last data filed at 03/14/12 1700  Gross per 24 hour  Intake    550 ml  Output    950 ml  Net   -400 ml    Physical Exam:  General Exam: Comfortable.  Respiratory System: Clear to auscultation. No increased work of breathing.  Cardiovascular System: First and second heart sounds heard. Regular rate and rhythm. No JVD/murmurs.  Gastrointestinal System: Abdomen is non distended, soft and normal bowel sounds heard.  Central Nervous System: Alert and oriented. No focal neurological deficits. Extremities: No leg edema. Symmetric 5 x 5 power. Left foot dressing clean dry and intact.  Labs:  Basic Metabolic Panel:  Lab 03/14/12 1610 03/13/12 0510 03/12/12 0520  NA 133* 135 134*  K 3.6 3.5 3.7  CL 101 102 97  CO2 23 25 27   GLUCOSE 87 92 90  BUN 20 22 14   CREATININE 1.03 1.03 1.00  CALCIUM 8.9 9.0 9.0  ALB -- -- --  PHOS -- -- --   CBC:  Lab 03/11/12 0550 03/09/12 2331  WBC 7.0 8.1  NEUTROABS 4.6 5.5  HGB 10.3* 9.8*  HCT 29.9* 28.6*  MCV 94.0 95.3  PLT 190 173   Cardiac Enzymes:  Lab 03/09/12 2331  CKTOTAL --  CKMB --  CKMBINDEX --  TROPONINI <0.30    Studies/Results: No results found. Medications:      . allopurinol  150 mg Oral Daily  . aspirin EC  81 mg Oral Daily  . atorvastatin  20 mg Oral BID  . carvedilol  6.25 mg Oral Q12H  . ceFEPIme  (MAXIPIME) 1 GM IVPB  1 g Intravenous Q12H  . citalopram  10 mg Oral QHS  . furosemide  40 mg Oral Daily  . gabapentin  600 mg Oral QHS  . heparin  5,000 Units Subcutaneous Q8H  . lisinopril  20 mg Oral Daily  . metroNIDAZOLE  500 mg Oral Q8H  . mirtazapine  7.5 mg Oral QHS  . potassium chloride  40 mEq Oral Daily  . saccharomyces boulardii  250 mg Oral BID  . sodium chloride  3 mL Intravenous Q12H  . Tamsulosin HCl  0.4 mg Oral Daily  . valACYclovir  500 mg Oral Daily    Problem/Plan: Principal Problem:  *Acute on chronic systolic congestive heart failure Active Problems:  Multiple myeloma  Hyponatremia  Open wound of left foot  Hypokalemia  1. C. Diff colitis: After discussing with ID; plan is to continue flagyl to his current antibiotic therapy and to be extended until he finish cefepime (Apr 17, 2012). Follow up with ID on Apr 01, 2012. Plan is to D/C home once his diarrhea episodes are 1-2 per day. 2. Dyspnea likely secondary to acute on  chronic systolic congestive heart failure in the context of recent fluid resuscitation during surgery. Clinically improved. Will continue Lasix PO. Close I's and O's. Continue lisinopril and Coreg. 2-D echocardiogram demonstarted EF of 30%; no changes to be done to current regimen. Patient already on ACE inhibitors. Follow up with cardiology as an outpatient. 3. Hypokalemia: Repleted. 3.6 today 4. Anemia: Stable. 5. Multiple myeloma: Outpatient followup with oncology.  6. CAD, status post CABG, ischemic cardiomyopathy with LVEF 30-35%: Continue lisinopril and Coreg.  7. Altered mental status: Resolved. Unclear etiology.? Secondary to Ativan and also metabolic disturbances. 8. Hypertension: BP better now; will continue current regimen. 9. Left foot wound status post resection of first ray 03/06/12. IVPB cefepime 1 g every 12 hours to day 42 from his surgery (04/17/12). Outpatient followup with Dr. Johny Sax. Outpatient followup with Dr. Jodi Geralds in 1 week. Ortho PA came today and changed dressings and provided further instructions for wound care. Will follow recommendations. 10. Hypoalbuminemia and protein calorie malnutrition: patient having diarrhea with feeding supplements; will hold them for now. Will start remeron 7.5mg  daily to increase appetite. 11. Depression: continue celexa; will also add remeron (mainly for appetite stimulation) but will also help depression. 12. Insomnia: start trazodone 100mg  QHS PRN to fall sleep.   Disposition: Discharge when diarrhea improves.    Tadarius Maland 03/14/2012,6:46 PM  LOS: 5 days

## 2012-03-14 NOTE — Progress Notes (Signed)
INFECTIOUS DISEASE PROGRESS NOTE  ID: Timothy [name] is a 78 y.o. male with   Principal Problem:  *Acute on chronic systolic congestive heart failure Active Problems:  Multiple myeloma  Hyponatremia  Open wound of left foot  Hypokalemia  Subjective: Multiple episodes of diarrhea yesterday. Improved with stopping supplements. Has had one semi-formed stool today.  Abtx:  Anti-infectives     Start     Dose/Rate Route Frequency Ordered Stop   03/12/12 0945   metroNIDAZOLE (FLAGYL) tablet 500 mg        500 mg Oral 3 times per day 03/12/12 0932 03/26/12 0559   03/10/12 1000   valACYclovir (VALTREX) tablet 500 mg        500 mg Oral Daily 03/10/12 0658     03/10/12 0800   ceFEPIme (MAXIPIME) 1 g in dextrose 5 % 50 mL IVPB        1 g 100 mL/hr over 30 Minutes Intravenous Every 12 hours 03/10/12 0658            Medications:  Scheduled:   . allopurinol  150 mg Oral Daily  . aspirin EC  81 mg Oral Daily  . atorvastatin  20 mg Oral BID  . carvedilol  6.25 mg Oral Q12H  . ceFEPIme (MAXIPIME) 1 GM IVPB  1 g Intravenous Q12H  . citalopram  10 mg Oral QHS  . furosemide  40 mg Oral Daily  . gabapentin  600 mg Oral QHS  . heparin  5,000 Units Subcutaneous Q8H  . lisinopril  20 mg Oral Daily  . metroNIDAZOLE  500 mg Oral Q8H  . mirtazapine  7.5 mg Oral QHS  . potassium chloride  40 mEq Oral Daily  . saccharomyces boulardii  250 mg Oral BID  . sodium chloride  3 mL Intravenous Q12H  . Tamsulosin HCl  0.4 mg Oral Daily  . valACYclovir  500 mg Oral Daily  . DISCONTD: feeding supplement  30 mL Oral QAC supper  . DISCONTD: feeding supplement  1 Container Oral BID BM    Objective: Vital signs in last 24 hours: Temp:  [97.1 F (36.2 C)-98.4 F (36.9 C)] 97.1 F (36.2 C) (04/25 0555) Pulse Rate:  [64-84] 66  (04/25 0555) Resp:  [18-19] 19  (04/25 0555) BP: (115-146)/(68-72) 127/72 mmHg (04/25 0555) SpO2:  [96 %-97 %] 97 % (04/25 0555) Weight:  [76.431 kg (168 lb 8 oz)] 76.431  kg (168 lb 8 oz) (04/24 1716)   General appearance: alert, cooperative and no distress Resp: clear to auscultation bilaterally Cardio: regular rate and rhythm GI: normal findings: bowel sounds normal and soft, non-tender  Lab Results  Basename 03/14/12 0450 03/13/12 0510  WBC -- --  HGB -- --  HCT -- --  NA 133* 135  K 3.6 3.5  CL 101 102  CO2 23 25  BUN 20 22  CREATININE 1.03 1.03  GLU -- --   Liver Panel No results found for this basename: PROT:2,ALBUMIN:2,AST:2,ALT:2,ALKPHOS:2,BILITOT:2,BILIDIR:2,IBILI:2 in the last 72 hours Sedimentation Rate No results found for this basename: ESRSEDRATE in the last 72 hours C-Reactive Protein No results found for this basename: CRP:2 in the last 72 hours  Microbiology: Recent Results (from the past 240 hour(s))  WOUND CULTURE     Status: Normal   Collection Time   03/06/12  1:30 PM      Component Value Range Status Comment   Specimen Description FOOT LEFT INFECTED   Final    Special Requests NONE  Final    Gram Stain     Final    Value: NO WBC SEEN     NO SQUAMOUS EPITHELIAL CELLS SEEN     NO ORGANISMS SEEN   Culture RARE PSEUDOMONAS AERUGINOSA   Final    Report Status 03/09/2012 FINAL   Final    Organism ID, Bacteria PSEUDOMONAS AERUGINOSA   Final   ANAEROBIC CULTURE     Status: Normal   Collection Time   03/06/12  1:30 PM      Component Value Range Status Comment   Specimen Description FOOT LEFT INFECTED   Final    Special Requests NONE   Final    Gram Stain     Final    Value: NO WBC SEEN     NO SQUAMOUS EPITHELIAL CELLS SEEN     NO ORGANISMS SEEN   Culture NO ANAEROBES ISOLATED   Final    Report Status 03/11/2012 FINAL   Final   URINE CULTURE     Status: Normal   Collection Time   03/10/12  8:06 PM      Component Value Range Status Comment   Specimen Description URINE, RANDOM   Final    Special Requests NONE   Final    Culture  Setup Time [id number]   Final    Colony Count NO GROWTH   Final    Culture NO  GROWTH   Final    Report Status 03/12/2012 FINAL   Final   CLOSTRIDIUM DIFFICILE BY PCR     Status: Abnormal   Collection Time   03/11/12  8:04 PM      Component Value Range Status Comment   C difficile by pcr POSITIVE (*) NEGATIVE  Final     Studies/Results: No results found.   Assessment/Plan: 78 yo M with hx of multiple myeloma, osteomyelitis of his L foot, ulcer of his L foot  Resection of 1st ray 03-06-12  Cx: P Aeruginosa (R-Cipro)  CHF  C diff  Would-  He is improving.  continue flagyl, at least 2 weeks, at least until he has ID clinic f/u (03-22-12?) and possibly for duration of cefepime.  continue his cefepime 1g IVPB q12 hours, to day 42 from his surgery (04-17-12). May need to shorten this if his loose BM continue to be a problem.     Timothy [name] Infectious Diseases [phone] 03/14/2012, 10:38 AM   LOS: 5 days

## 2012-03-15 NOTE — Progress Notes (Signed)
Subjective:   Diarrhea improving (about 2 episodes today and more form). C. Diff by PCR positive. Afebrile and otherwise doing and feeling ok. No abdominal pain or nausea or vomiting. No dyspnea or CP.  Objective  Vital signs in last 24 hours: Filed Vitals:   03/14/12 2100 03/15/12 0709 03/15/12 1327 03/15/12 1500  BP: 133/73 134/73 107/66 127/66  Pulse: 82 78 86 84  Temp: 97.6 F (36.4 C) 97.8 F (36.6 C) 97.9 F (36.6 C)   TempSrc: Oral Oral    Resp: 20 20 16 16   Height:      Weight:  73.211 kg (161 lb 6.4 oz)    SpO2: 98% 97% 100%    Weight change:   Intake/Output Summary (Last 24 hours) at 03/15/12 1603 Last data filed at 03/15/12 1300  Gross per 24 hour  Intake    890 ml  Output   1300 ml  Net   -410 ml    Physical Exam:  General Exam: Comfortable.  Respiratory System: Clear to auscultation. No increased work of breathing.  Cardiovascular System: First and second heart sounds heard. Regular rate and rhythm. No JVD/murmurs.  Gastrointestinal System: Abdomen is non distended, soft and normal bowel sounds heard.  Central Nervous System: Alert and oriented. No focal neurological deficits. Extremities: No leg edema. Symmetric 5 x 5 power. Left foot dressing clean dry and intact.  Labs:  Basic Metabolic Panel:  Lab 03/14/12 1610 03/13/12 0510 03/12/12 0520  NA 133* 135 134*  K 3.6 3.5 3.7  CL 101 102 97  CO2 23 25 27   GLUCOSE 87 92 90  BUN 20 22 14   CREATININE 1.03 1.03 1.00  CALCIUM 8.9 9.0 9.0  ALB -- -- --  PHOS -- -- --   CBC:  Lab 03/11/12 0550 03/09/12 2331  WBC 7.0 8.1  NEUTROABS 4.6 5.5  HGB 10.3* 9.8*  HCT 29.9* 28.6*  MCV 94.0 95.3  PLT 190 173   Cardiac Enzymes:  Lab 03/09/12 2331  CKTOTAL --  CKMB --  CKMBINDEX --  TROPONINI <0.30    Studies/Results: No results found. Medications:      . allopurinol  150 mg Oral Daily  . aspirin EC  81 mg Oral Daily  . atorvastatin  20 mg Oral BID  . carvedilol  6.25 mg Oral Q12H  .  ceFEPIme (MAXIPIME) 1 GM IVPB  1 g Intravenous Q12H  . citalopram  10 mg Oral QHS  . furosemide  40 mg Oral Daily  . gabapentin  600 mg Oral QHS  . heparin  5,000 Units Subcutaneous Q8H  . lisinopril  20 mg Oral Daily  . metroNIDAZOLE  500 mg Oral Q8H  . mirtazapine  7.5 mg Oral QHS  . potassium chloride  40 mEq Oral Daily  . saccharomyces boulardii  250 mg Oral BID  . sodium chloride  3 mL Intravenous Q12H  . Tamsulosin HCl  0.4 mg Oral Daily  . valACYclovir  500 mg Oral Daily    Problem/Plan: Principal Problem:  *Acute on chronic systolic congestive heart failure Active Problems:  Multiple myeloma  Hyponatremia  Open wound of left foot  Hypokalemia  1. C. Diff colitis: After discussing with ID; plan is to continue flagyl to his current antibiotic therapy and to be extended until he finish cefepime (Apr 17, 2012). Follow up with ID on Apr 01, 2012. Plan is to D/C home tomorrow; diarrhea continue improving and so far just BM's today. 2. Dyspnea likely secondary to acute  on chronic systolic congestive heart failure in the context of recent fluid resuscitation during surgery. Clinically improved. Will continue Lasix PO. Close I's and O's. Continue lisinopril and Coreg. 2-D echocardiogram demonstarted EF of 30%; no changes to be done to current regimen. Patient already on ACE inhibitors. Follow up with cardiology as an outpatient. 3. Hypokalemia: Repleted. 3.6 today 4. Anemia: Stable. 5. Multiple myeloma: Outpatient followup with oncology.  6. CAD, status post CABG, ischemic cardiomyopathy with LVEF 30-35%: Continue lisinopril and Coreg.  7. Altered mental status: Resolved. Unclear etiology.? Secondary to Ativan and also metabolic disturbances. 8. Hypertension: BP better now; will continue current regimen. 9. Left foot wound status post resection of first ray 03/06/12. IVPB cefepime 1 g every 12 hours to day 42 from his surgery (04/17/12). Outpatient followup with Dr. Johny Sax.  Outpatient followup with Dr. Jodi Geralds in 1 week.  10. Hypoalbuminemia and protein calorie malnutrition: patient having diarrhea with feeding supplements; will hold them for now. Will start remeron 7.5mg  daily to increase appetite. 11. Depression: continue celexa; will also add remeron (mainly for appetite stimulation) but will also help depression. 12. Insomnia: continue trazodone 100mg  QHS PRN to fall sleep.   Disposition: Discharge home tomorrow.    Finian Helvey 03/15/2012,4:03 PM  LOS: 6 days

## 2012-03-15 NOTE — Progress Notes (Signed)
   CARE MANAGEMENT NOTE 03/15/2012  Patient:  Timothy [name], Timothy [name]   Account Number:  [id number]  Date Initiated:  03/11/2012  Documentation initiated by:  [facility]  Subjective/Objective Assessment:   ADMITTED W/SOB.CHF.HX:L FOOT OSTEOMYELITIS     Action/Plan:   FROM HOME ALREADY ACTIVE W/[facility]-HHRN-IV ABX.   Anticipated DC Date:  03/16/2012   Anticipated DC Plan:  HOME W HOME HEALTH SERVICES         Choice offered to / List presented to:  C-1 Patient        HH arranged  HH-1 RN  IV Antibiotics  HH-10 DISEASE MANAGEMENT  HH-2 PT  HH-3 OT      [facility] agency  [facility]   Status of service:  In process, will continue to follow Medicare Important Message given?   (If response is "NO", the following Medicare IM given date fields will be blank) Date Medicare IM given:   Date Additional Medicare IM given:    Discharge Disposition:    Per UR Regulation:  Reviewed for med. necessity/level of care/duration of stay  If discussed at Long Length of Stay Meetings, dates discussed:    Comments:  03/15/12 Melaysia Streed RN,BSN NCM [phone] [facility] NOTIFIED OF HHRN-IV ABX/PT/OT.  03/11/12 Stanislaw Acton RN,BSN NCM [phone] IF MD AGREE HHRN-DISEASE MGMNT(CHF PROTOCAL),WILL NEED HHRN ORDERS, & F2F.ALSO HHRN-IV ABX ORDER W/MED/DOSE/FREQUENCY/DURATION.[facility] SUSAN DALE NOTIFIED, & FOLLOWING OF ?D/C IN AM.

## 2012-03-15 NOTE — Progress Notes (Signed)
PT/OT/ST Cancellation Note  ___Treatment cancelled today due to medical issues with patient which prohibited therapy  ___ Treatment cancelled today due to patient receiving procedure or test   _x__ Treatment cancelled today due to patient's refusal to participate-"too early". Wife and nephew in room-state they will help him up to chair a little later. May d/c home later today per wife.   ___ Treatment cancelled today due to   Signature: Rebeca Alert, PT [phone]

## 2012-03-16 LAB — BASIC METABOLIC PANEL
BUN: 23 mg/dL (ref 6–23)
Creatinine, Ser: 1.21 mg/dL (ref 0.50–1.35)
GFR calc Af Amer: 63 mL/min — ABNORMAL LOW (ref >90)
GFR calc non Af Amer: 54 mL/min — ABNORMAL LOW (ref >90)
Glucose, Bld: 99 mg/dL (ref 70–99)
Potassium: 4.3 mEq/L (ref 3.5–5.1)

## 2012-03-16 LAB — CBC
HCT: 31.8 % — ABNORMAL LOW (ref 39.0–52.0)
Hemoglobin: 10.5 g/dL — ABNORMAL LOW (ref 13.0–17.0)
MCH: 31.6 pg (ref 26.0–34.0)
MCHC: 33 g/dL (ref 30.0–36.0)
MCV: 95.8 fL (ref 78.0–100.0)
RDW: 14.5 % (ref 11.5–15.5)

## 2012-03-16 MED ORDER — METRONIDAZOLE 500 MG PO TABS: 500.0000 mg | ORAL_TABLET | Freq: Three times a day (TID) | ORAL | Status: DC

## 2012-03-16 MED ORDER — DEXTROSE 5 % IV SOLN: 1.0000 g | Freq: Two times a day (BID) | INTRAVENOUS | Status: AC

## 2012-03-16 MED ORDER — TRAZODONE HCL 100 MG PO TABS: 100.0000 mg | ORAL_TABLET | Freq: Every evening | ORAL | Status: DC | PRN

## 2012-03-16 MED ORDER — POTASSIUM CHLORIDE CRYS ER 20 MEQ PO TBCR: 40.0000 meq | EXTENDED_RELEASE_TABLET | Freq: Every day | ORAL | Status: DC

## 2012-03-16 MED ORDER — MIRTAZAPINE 7.5 MG PO TABS: 7.5000 mg | ORAL_TABLET | Freq: Every day | ORAL | Status: DC

## 2012-03-16 MED ORDER — FUROSEMIDE 40 MG PO TABS: 40.0000 mg | ORAL_TABLET | Freq: Every day | ORAL | Status: DC

## 2012-03-16 NOTE — Discharge Summary (Signed)
Physician Discharge Summary  Patient ID: Timothy [name] MRN: [id number] DOB/AGE: [birth year] 76 y.o.  Admit date: 03/09/2012 Discharge date: 03/16/2012  Primary Care Physician:  Timothy Dell, MD, MD   Discharge Diagnoses:   1-Acute on chronic systolic congestive heart failure 2-C. Diff colitis 3-Open wound of left foot 4-Hypokalemia 5-Multiple myeloma 6-HLD 7-Systolic heart failure (EF 30%) 8-Depression 9-Protein calorie malnutrition 10-Chronic anemia   Medication List  As of 03/16/2012 11:25 AM   STOP taking these medications         cyclophosphamide 50 MG tablet      zolpidem 5 MG tablet         TAKE these medications         acetaminophen 500 MG tablet   Commonly known as: TYLENOL   Take 500 mg by mouth every 4 (four) hours as needed. Pain        allopurinol 300 MG tablet   Commonly known as: ZYLOPRIM   Take 150 mg by mouth daily.      aspirin 81 MG tablet   Take 1 tablet (81 mg total) by mouth daily.      atorvastatin 40 MG tablet   Commonly known as: LIPITOR   Take 20 mg by mouth 2 (two) times daily.      B COMPLETE PO   Take 0.5 tablets by mouth 2 (two) times daily.      carvedilol 3.125 MG tablet   Commonly known as: COREG   Take 1 tablet (3.125 mg total) by mouth 2 (two) times daily with a meal.      citalopram 10 MG tablet   Commonly known as: CELEXA   Take 10 mg by mouth at bedtime.      CO Q-10 PO   Take 1 tablet by mouth daily.      dextrose 5 % SOLN 50 mL with ceFEPIme 1 G SOLR 1 g   Inject 1 g into the vein every 12 (twelve) hours.      FISH OIL PO   Take 1 capsule by mouth 2 (two) times daily.      fludrocortisone 0.1 MG tablet   Commonly known as: FLORINEF   Take 1 tablet (0.1 mg total) by mouth daily.      furosemide 40 MG tablet   Commonly known as: LASIX   Take 1 tablet (40 mg total) by mouth daily.      gabapentin 300 MG capsule   Commonly known as: NEURONTIN   Take 600 mg by mouth at bedtime.      HYDROmorphone  4 MG tablet   Commonly known as: DILAUDID   Take 1 tablet (4 mg total) by mouth every 4 (four) hours as needed.      lisinopril 20 MG tablet   Commonly known as: PRINIVIL,ZESTRIL   Take 20 mg by mouth daily.      metroNIDAZOLE 500 MG tablet   Commonly known as: FLAGYL   Take 1 tablet (500 mg total) by mouth every 8 (eight) hours.      mirtazapine 7.5 MG tablet   Commonly known as: REMERON   Take 1 tablet (7.5 mg total) by mouth at bedtime.      multivitamin per tablet   Take 0.5 tablets by mouth 2 (two) times daily.      potassium chloride SA 20 MEQ tablet   Commonly known as: K-DUR,KLOR-CON   Take 2 tablets (40 mEq total) by mouth daily.      Tamsulosin HCl 0.4  MG Caps   Commonly known as: FLOMAX   Take 1 capsule (0.4 mg total) by mouth daily.      traZODone 100 MG tablet   Commonly known as: DESYREL   Take 1 tablet (100 mg total) by mouth at bedtime as needed for sleep.      valACYclovir 500 MG tablet   Commonly known as: VALTREX   Take 500 mg by mouth daily. CONT.      vitamin C 500 MG tablet   Commonly known as: ASCORBIC ACID   Take 500 mg by mouth daily.      Zinc 50 MG Tabs   Take 50 mg by mouth daily.             Disposition and Follow-up:  Patient discharge in stable and improved condition; currently with 1-2 episodes of loose stool; but with improved PO inatek, no orthostatic changes and with normal electrolytes. Patient will follow discharge instructions and medications as prescribed and will follow with Orthopedist in 1 week and with infectious disease on May 13th. He will continue holding chemotherapy agent until his foot wound has healed and will continue following with oncologist. Patient will also follow with cardiologist as PTA instructed.  Consults:   ID Ortho  Significant Diagnostic Studies:  Dg Chest 2 View  03/10/2012  *RADIOLOGY REPORT*  Clinical Data: Dizziness.  Hypertension.  Shortness of breath starting yesterday.  CHEST - 2 VIEW   Comparison: 04/20/2011  Findings: Stable appearance of postoperative changes in the mediastinum.  Interval placement of a right PICC catheter with tip over the distal SVC region.  Increasing heart size and pulmonary vascularity suggesting developing congestive failure.  Small bilateral pleural effusion. Bilateral basilar atelectasis.  No focal airspace consolidation in the lungs.  No pneumothorax.  IMPRESSION: Developing congestive changes in the heart and lungs without obvious edema.  Small bilateral pleural effusions and basilar atelectasis.  Original Report Authenticated By: Marlon Pel, M.D.    Brief H and P: 16NW[age] with h/o multiple myeloma since 2007, CAD s/p inferior MI 1986 and CABG 2002,  ischemic CMP and chronic sCHF 30-35%, and s/p left 1st toe amputation for  osteomyelitis/chronic wound on 4/17 presents with dyspnea and found to have pulmonary  edema and hypokalemia.    Hospital Course:  1. C. Diff colitis: After discussing with ID; plan is to continue flagyl along with his current antibiotic therapy and to be extended until he finish cefepime (Apr 17, 2012). Follow up with ID on Apr 01, 2012. Patient instructed to keep himself hydrated and to increase PO intake. 2. Dyspnea likely secondary to acute on chronic systolic congestive heart failure in the context of recent fluid resuscitation during surgery. Clinically improved. Will continue home medication regimen and follow up as an outpatient with his cardiology. echocardiogram demonstarted EF of 30%; no changes to be done to current regimen. Patient already on ACE inhibitors.  3. Hypokalemia: Repleted and WNL at discharge. 4. Multiple myeloma: Outpatient followup with oncology. Hold cyclophosphamide until foot wound is healed. 5. CAD, status post CABG, ischemic cardiomyopathy with LVEF 30-35%: Continue lisinopril, ASA, lipitor and Coreg.  6. Altered mental status: Resolved. Unclear etiology.? Secondary to Ativan and also metabolic  disturbances. 7. Hypertension: BP well controlled. Will continue continue current regimen. 8. Left foot wound status post resection of first ray 03/06/12. IVPB cefepime 1 g every 12 hours to day 42 from his surgery (04/17/12). Outpatient followup with Dr. Johny Sax. Outpatient followup with Dr. Jodi Geralds in  1 week.  9. Hypoalbuminemia and protein calorie malnutrition: patient having diarrhea with feeding supplements; will hold them for now. Will continue remeron 7.5mg  daily to increase appetite. 10. Depression: continue celexa; and now remeron (mainly for appetite stimulation) but will also help depression. 11. Insomnia: continue trazodone 100mg  QHS PRN to fall sleep.  Time spent on Discharge: 40 minutes  Signed: Sulo Janczak 03/16/2012, 11:25 AM

## 2012-03-18 ENCOUNTER — Encounter (HOSPITAL_COMMUNITY): Payer: Self-pay | Admitting: Orthopedic Surgery

## 2012-03-19 ENCOUNTER — Telehealth: Payer: Self-pay | Admitting: Licensed Clinical Social Worker

## 2012-03-19 NOTE — Telephone Encounter (Signed)
Patient's wife called stating that the patient had an appointment with Dr. Luiz Blare at [facility] and he wanted to talk to Dr. Ninetta Lights about he current antibiotic that he is taking. The patient is having diarrhea 4 times daily. Please advise.  Dr. Luiz Blare would like Dr. Ninetta Lights to call him at [phone]

## 2012-03-20 ENCOUNTER — Encounter: Payer: Self-pay | Admitting: Nurse Practitioner

## 2012-03-20 ENCOUNTER — Telehealth: Payer: Self-pay | Admitting: Infectious Diseases

## 2012-03-20 ENCOUNTER — Ambulatory Visit (INDEPENDENT_AMBULATORY_CARE_PROVIDER_SITE_OTHER): Payer: Medicare Other | Admitting: Nurse Practitioner

## 2012-03-20 DIAGNOSIS — I2589 Other forms of chronic ischemic heart disease: Secondary | ICD-10-CM

## 2012-03-20 DIAGNOSIS — R6889 Other general symptoms and signs: Secondary | ICD-10-CM

## 2012-03-20 DIAGNOSIS — R0600 Dyspnea, unspecified: Secondary | ICD-10-CM

## 2012-03-20 DIAGNOSIS — I255 Ischemic cardiomyopathy: Secondary | ICD-10-CM

## 2012-03-20 DIAGNOSIS — I5020 Unspecified systolic (congestive) heart failure: Secondary | ICD-10-CM

## 2012-03-20 DIAGNOSIS — R0609 Other forms of dyspnea: Secondary | ICD-10-CM

## 2012-03-20 NOTE — Progress Notes (Signed)
Timothy [name] Date of Birth: 07/10/1928 Medical Record #[id number]  History of Present Illness: [name] is seen today for a post hospital visit. He is seen for Dr. [place]. He is an 78 year old male with a complex medical history which is outlined as below. He has an ischemic CM. EF is about 30%. His other issues include multiple myeloma, HLD, & depression.   He comes in today. He is here with his wife and nephew. He has had quite a rough time. His last visit here was in December and he was doing ok. In February he developed a callous on his foot that did not heal and ended up with a toe amputation for osteomyelitis 2 weeks ago. Since then, he got readmitted with congestion and Cdiff. He remains on antibiotics and has a PICC in place. He is now on Lasix along with his Coreg and ACE. He remains quite weak. He is depressed. He is malnourished. He has lost over 20 pounds of weight. He is not doing much in the way of activity. He stays very cold. He actually has gloves and a coat on during the visit. Has had issues with his potassium being too low, yesterday was apparently too high. No chest pain. Not too much dyspnea. His chemo has been on hold since February due to his infection. Blood pressures are now in the 90 to 110 systolic range.   Current Outpatient Prescriptions on File Prior to Visit  Medication Sig Dispense Refill  . allopurinol (ZYLOPRIM) 300 MG tablet Take 150 mg by mouth daily.       Marland Kitchen aspirin 81 MG tablet Take 1 tablet (81 mg total) by mouth daily.  30 tablet  0  . atorvastatin (LIPITOR) 40 MG tablet Take 20 mg by mouth daily.       . B Complex-Biotin-FA (B COMPLETE PO) Take 0.5 tablets by mouth 2 (two) times daily.       . carvedilol (COREG) 3.125 MG tablet Take 1 tablet (3.125 mg total) by mouth 2 (two) times daily with a meal.  180 tablet  3  . citalopram (CELEXA) 10 MG tablet Take 10 mg by mouth at bedtime.      . Coenzyme Q10 (CO Q-10 PO) Take 1 tablet by mouth daily.       Marland Kitchen  dextrose 5 % SOLN 50 mL with ceFEPIme 1 G SOLR 1 g Inject 1 g into the vein every 12 (twelve) hours.  90 Units  0  . furosemide (LASIX) 40 MG tablet Take 1 tablet (40 mg total) by mouth daily.  30 tablet  1  . lisinopril (PRINIVIL,ZESTRIL) 20 MG tablet Take 20 mg by mouth daily.      . metroNIDAZOLE (FLAGYL) 500 MG tablet Take 1 tablet (500 mg total) by mouth every 8 (eight) hours.  90 tablet  0  . mirtazapine (REMERON) 7.5 MG tablet Take 1 tablet (7.5 mg total) by mouth at bedtime.  30 tablet  0  . multivitamin (THERAGRAN) per tablet Take 0.5 tablets by mouth 2 (two) times daily.       . Omega-3 Fatty Acids (FISH OIL PO) Take 1 capsule by mouth 2 (two) times daily.       . Tamsulosin HCl (FLOMAX) 0.4 MG CAPS Take 1 capsule (0.4 mg total) by mouth daily.  90 capsule  1  . traZODone (DESYREL) 100 MG tablet Take 1 tablet (100 mg total) by mouth at bedtime as needed for sleep.  30 tablet  0  . valACYclovir (VALTREX) 500 MG tablet Take 500 mg by mouth daily. CONT.      Marland Kitchen vitamin C (ASCORBIC ACID) 500 MG tablet Take 500 mg by mouth daily.      . Zinc 50 MG TABS Take 50 mg by mouth daily.      Marland Kitchen HYDROmorphone (DILAUDID) 4 MG tablet Take 1 tablet (4 mg total) by mouth every 4 (four) hours as needed.  40 tablet  0    Allergies  Allergen Reactions  . Morphine And Related Other (See Comments)    Could not seat still    Past Medical History  Diagnosis Date  . ASCVD (arteriosclerotic cardiovascular disease)   . Anemia     chronic mild anemia  . Gout   . Hypertension   . Lumbar disc disease     post laminectomy  . Chronic back pain   . History of echocardiogram 11/02/2010    EF range of 30 to 35% / There is hypokinsesis of the basal-mild inferolateral myocardium  . CHF (congestive heart failure)   . Heart murmur   . Ischemic cardiomyopathy   . Renal insufficiency   . Peripheral neuropathy   . Inferior myocardial infarction 1986  . Hyperlipidemia   . SOB (shortness of breath)   . Fatigue     . Peripheral neuropathy   . Coronary artery disease     CABG 2002 by Dr. Cornelius [name] with LIMA to LAD, SVG to intermediate, SVG to LCX, & SVG to PL. Last nuclear in 2012 showing large inferior scar with EF of 39%.   . Blood transfusion     DEC 2012 - TWO UNITS  . 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  PT HAS BEEN OFF CHEMO SINCE HIS HOSPITALIZATION FEB 2013 FOR LEFT FOOT INFECTION  . Cancer   . History of shingles MARCH 2009    SHINGLE LESIONS WERE AROUND RIGHT EYE--PT HAS RESIDUAL ITCHING AROUND THE EYE.  . Osteomyelitis     s/p toe amputation  . Chronic systolic heart failure     EF of 30% per echo 02/2012  . Protein malnutrition     Past Surgical History  Procedure Date  . Laminectomy   . Inguinal hernia repair   . Cardiac catheterization 06/20/2000    Severe coronary disease (totally occluded right artery, 90% left circumflex, 50-60% intermediate, and 90-95% ostial left anterior descending)   . Tonsillectomy   . Coronary artery bypass graft 2002    x4 / Left internal mammary artery to the LAD.  / Saphenous vein graft to the right posterolateral.  /  Saphenous vein graft to  the ramus intermedius. / Saphenous vein graft to the circumflex marginal     . Amputation 03/06/2012    Procedure: AMPUTATION RAY;  Surgeon: Timothy Junior, MD;  Location: [facility];  Service: Orthopedics;  Laterality: Left;  First Ray Amputation    History  Smoking status  . Former Smoker -- 1.0 packs/day for 20 years  . Types: Cigarettes  . Quit date: 11/20/1984  Smokeless tobacco  . Never Used    History  Alcohol Use No    History reviewed. No pertinent family history.  Review of Systems: The review of systems is per the HPI.  All other systems were reviewed and are negative.  Physical Exam: BP 108/60  Pulse 80  Ht 5\' 10"  (1.778 m)  Wt 162 lb (73.483 kg)  BMI 23.24  kg/m2 Patient is very pleasant and in no acute distress. He does look  thinner and appears chronically ill. Skin is warm and dry. Color is normal.  HEENT is unremarkable. Normocephalic/atraumatic. PERRL. Sclera are nonicteric. Neck is supple. No masses. No JVD. Lungs are clear. Cardiac exam shows a regular rate and rhythm. Soft S3 noted. Abdomen is soft. Extremities are without edema. Boot in place on the left foot. Gait is not tested. He is in a wheelchair. No gross neurologic deficits noted.   LABORATORY DATA: BMET and BNP today are pending.   Echo Study Conclusions April 2013  - Left ventricle: diffuse hypokinesis with mid and basal inferior wall akinesis The cavity size was moderately dilated. Wall thickness was increased in a pattern of mild LVH. The estimated ejection fraction was 30%. - Mitral valve: Mild regurgitation. - Left atrium: The atrium was mildly dilated. - Atrial septum: No defect or patent foramen ovale was identified.  Lab Results  Component Value Date   WBC 7.0 03/16/2012   HGB 10.5* 03/16/2012   HCT 31.8* 03/16/2012   PLT 228 03/16/2012   GLUCOSE 99 03/16/2012   ALT 15 03/01/2012   AST 32 03/01/2012   NA 132* 03/16/2012   K 4.3 03/16/2012   CL 100 03/16/2012   CREATININE 1.21 03/16/2012   BUN 23 03/16/2012   CO2 23 03/16/2012   TSH 0.837 01/10/2012   PSA 3.75 05/30/2006   INR 1.10 03/01/2012     Assessment / Plan:

## 2012-03-20 NOTE — Patient Instructions (Addendum)
Stay on your current medicines  We are going to check some labs today  Weigh each day  You must try a do a little activity each day  Try to eat with the goal of 4-6 small meals a day  Stay on your current medicines.  I will see you in 3 weeks.   Call the [facility] office at [phone] if you have any questions, problems or concerns.

## 2012-03-20 NOTE — Discharge Summary (Signed)
Patient ID: Timothy [name] MRN: [id number] DOB/AGE: 08/12/1928 78 y.o.  Admit date: 03/06/2012 Discharge date:03/08/12 Admission Diagnoses:  Principal Problem:  *Osteomyelitis of left foot Active Problems:  Open wound of left foot   Discharge Diagnoses:  Same  Past Medical History  Diagnosis Date  . ASCVD (arteriosclerotic cardiovascular disease)   . Anemia     chronic mild anemia  . Gout   . Hypertension   . Lumbar disc disease     post laminectomy  . Chronic back pain   . History of echocardiogram 11/02/2010    EF range of 30 to 35% / There is hypokinsesis of the basal-mild inferolateral myocardium  . CHF (congestive heart failure)   . Heart murmur   . Ischemic cardiomyopathy   . Renal insufficiency   . Peripheral neuropathy   . Inferior myocardial infarction 1986  . Hyperlipidemia   . SOB (shortness of breath)   . Fatigue   . Peripheral neuropathy   . Coronary artery disease     CABG 2002  . Blood transfusion     DEC 2012 - TWO UNITS  . 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  PT HAS BEEN OFF CHEMO SINCE HIS HOSPITALIZATION FEB 2013 FOR LEFT FOOT INFECTION  . Cancer   . History of shingles MARCH 2009    SHINGLE LESIONS WERE AROUND RIGHT EYE--PT HAS RESIDUAL ITCHING AROUND THE EYE.    Surgeries: Procedure(s):Left 1st ray amputation. AMPUTATION RAY on 03/06/2012   Consultants:  Dr Ninetta Lights    ID  Discharged Condition: Improved  Hospital Course: Timothy [name] is an 78 y.o. male who was admitted 03/06/2012 for operative treatment ofOsteomyelitis of left foot. Patient has severe unremitting pain that affects sleep, daily activities, and work/hobbies. After pre-op clearance the patient was taken to the operating room on 03/06/2012 and underwent  Procedure(s):Left 1st ray amputation. AMPUTATION RAY.    Patient was given perioperative antibiotics:  Anti-infectives     Start     Dose/Rate Route Frequency  Ordered Stop   03/08/12 0000   dextrose 5 % SOLN 50 mL with ceFEPIme 1 G SOLR 1 g  Status:  Discontinued        1 g 100 mL/hr over 30 Minutes Intravenous Every 12 hours 03/08/12 0912 03/16/12    03/06/12 2000   valACYclovir (VALTREX) tablet 500 mg  Status:  Discontinued        500 mg Oral Daily 03/06/12 1733 03/08/12 1526   03/06/12 1800   ceFEPIme (MAXIPIME) 1 g in dextrose 5 % 50 mL IVPB  Status:  Discontinued        1 g 100 mL/hr over 30 Minutes Intravenous Every 12 hours 03/06/12 1640 03/08/12 1526   03/06/12 1800   vancomycin (VANCOCIN) IVPB 1000 mg/200 mL premix  Status:  Discontinued        1,000 mg 200 mL/hr over 60 Minutes Intravenous Every 12 hours 03/06/12 1753 03/08/12 1526   03/06/12 1041   ceFAZolin (ANCEF) IVPB 2 g/50 mL premix        2 g 100 mL/hr over 30 Minutes Intravenous 60 min pre-op 03/06/12 1041 03/06/12 1330           Patient was given sequential compression devices, early ambulation, and chemoprophylaxis to prevent DVT.  Patient benefited maximally from hospital stay and there were no complications. Wound was benign at discharge. No c/o's.  No SOB or diarrhea.  Recent vital signs:  VSS, afebrile   Recent laboratory studies: see chart  Discharge Medications:   Medication List  As of 03/20/2012  9:04 AM   TAKE these medications         acetaminophen 500 MG tablet   Commonly known as: TYLENOL   Take 500 mg by mouth every 4 (four) hours as needed. Pain        allopurinol 300 MG tablet   Commonly known as: ZYLOPRIM   Take 150 mg by mouth daily.      aspirin 81 MG tablet   Take 1 tablet (81 mg total) by mouth daily.      atorvastatin 40 MG tablet   Commonly known as: LIPITOR   Take 20 mg by mouth 2 (two) times daily.      B COMPLETE PO   Take 0.5 tablets by mouth 2 (two) times daily.      carvedilol 3.125 MG tablet   Commonly known as: COREG   Take 1 tablet (3.125 mg total) by mouth 2 (two) times daily with a meal.      citalopram 10 MG  tablet   Commonly known as: CELEXA   Take 10 mg by mouth at bedtime.      CO Q-10 PO   Take 1 tablet by mouth daily.      FISH OIL PO   Take 1 capsule by mouth 2 (two) times daily.      fludrocortisone 0.1 MG tablet   Commonly known as: FLORINEF   Take 1 tablet (0.1 mg total) by mouth daily.      gabapentin 300 MG capsule   Commonly known as: NEURONTIN   Take 600 mg by mouth at bedtime.      HYDROmorphone 4 MG tablet   Commonly known as: DILAUDID   Take 1 tablet (4 mg total) by mouth every 4 (four) hours as needed.      multivitamin per tablet   Take 0.5 tablets by mouth 2 (two) times daily.      Tamsulosin HCl 0.4 MG Caps   Commonly known as: FLOMAX   Take 1 capsule (0.4 mg total) by mouth daily.      valACYclovir 500 MG tablet   Commonly known as: VALTREX   Take 500 mg by mouth daily. CONT.      vitamin C 500 MG tablet   Commonly known as: ASCORBIC ACID   Take 500 mg by mouth daily.      Zinc 50 MG Tabs   Take 50 mg by mouth daily.            Diagnostic Studies: Dg Chest 2 View  03/10/2012  *RADIOLOGY REPORT*  Clinical Data: Dizziness.  Hypertension.  Shortness of breath starting yesterday.  CHEST - 2 VIEW  Comparison: 04/20/2011  Findings: Stable appearance of postoperative changes in the mediastinum.  Interval placement of a right PICC catheter with tip over the distal SVC region.  Increasing heart size and pulmonary vascularity suggesting developing congestive failure.  Small bilateral pleural effusion. Bilateral basilar atelectasis.  No focal airspace consolidation in the lungs.  No pneumothorax.  IMPRESSION: Developing congestive changes in the heart and lungs without obvious edema.  Small bilateral pleural effusions and basilar atelectasis.  Original Report Authenticated By: Marlon Pel, M.D.   Dg Bone Survey Met  03/01/2012  *RADIOLOGY REPORT*  Clinical Data: Multiple myeloma.  Routine follow-up.  METASTATIC BONE SURVEY  Comparison: Metastatic survey  08/30/2009.  Findings: Small calvarial lucencies appear unchanged.  There  is stable spondylosis in the lower cervical and mid lumbar spine.  There is a stable convex left lumbar scoliosis.  No focal lytic lesion or pathologic fracture is identified. Diffuse vascular calcifications are again noted status post CABG.  Radiographs of the proximal appendicular skeleton demonstrate no lytic lesion or pathologic fracture.  There are stable scattered enthesopathic changes.  There is stable chondrocalcinosis and both hips.  Diffuse calcifications of the femoral arteries are noted.  IMPRESSION: Stable examination without evidence of progressive lytic lesions or pathologic fracture.  Stable cervical and lumbar spondylosis.  Original Report Authenticated By: Gerrianne Scale, M.D.    Disposition: 06-Home-Health Care Svc  Discharge Orders    Future Appointments: Provider: Department: Dept Phone: Center:   03/20/2012 2:30 PM Rosalio Macadamia, NP [facility] [phone] None   03/21/2012 11:15 AM Cliffton Asters, MD [facility] [phone] [facility]   04/04/2012 11:00 AM Alvina Filbert [facility] [phone] None   04/04/2012 11:15 AM [facility] [phone] None   04/11/2012 1:00 PM Krista Blue [facility] [phone] None   04/11/2012 1:30 PM [name], MD [facility] [phone] None     Future Orders Please Complete By Expires   Diet general      Care order/instruction      Comments:   Will need HHRN for QOD dry dressing changes and wound checks. Also IV cefepime 1 gr iv q 12 hrs x 6 weeks. HHPT eval  Walker ambulation NWB on left leg.   Call MD / Call 911      Comments:   If you experience chest pain or shortness of breath, CALL 911 and be transported to the hospital emergency room.  If you develope a fever above 101 F, pus (white drainage) or increased drainage or redness at the wound, or calf pain, call your surgeon's office.   Weight Bearing as  taught in Physical Therapy      Comments:   Use a walker or crutches as instructed.   Discharge instructions      Comments:   Ambulate non wgt bearing on your left leg.      Follow-up Information    Follow up with GRAVES,JOHN L, MD. Schedule an appointment as soon as possible for a visit in 2 weeks.   Contact information:   [address] [place] [address] [phone]       Follow up with Johny Sax, MD. Schedule an appointment as soon as possible for a visit in 2 weeks.   Contact information:   [address]  [address] [place] [place] [address] [phone]           Signed: Matthew Folks 03/20/2012, 9:04 AM

## 2012-03-20 NOTE — Assessment & Plan Note (Signed)
He has had a recent exacerbation of his heart failure. EF is 30%. His protein levels have been low. Nutrition is going to be imperative. He needs to start moving around a little more. He has been given the ok by ortho in regards to his foot. We are rechecking his labs today. We have discussed his heart failure in great detail today. Will try to start maximizing his medicines once he is a little stronger. Hopefully we will see some improvement but his overall prognosis seems quite guarded to me at this time. For now, he will stay with his current medicines, daily weights and sodium restriction. Patient and his wife are agreeable to this plan and will call if any problems develop in the interim.

## 2012-03-20 NOTE — Telephone Encounter (Signed)
Discussed care, anbx plans with Dr Luiz Blare.

## 2012-03-21 ENCOUNTER — Encounter: Payer: Self-pay | Admitting: Internal Medicine

## 2012-03-21 ENCOUNTER — Ambulatory Visit (INDEPENDENT_AMBULATORY_CARE_PROVIDER_SITE_OTHER): Payer: Medicare Other | Admitting: Internal Medicine

## 2012-03-21 VITALS — BP 111/67 | HR 90 | Temp 97.7°F | Ht 70.0 in | Wt 162.0 lb

## 2012-03-21 DIAGNOSIS — M869 Osteomyelitis, unspecified: Secondary | ICD-10-CM

## 2012-03-21 LAB — CBC WITH DIFFERENTIAL/PLATELET
Basophils Absolute: 0 10*3/uL (ref 0.0–0.1)
Basophils Relative: 0.4 % (ref 0.0–3.0)
Eosinophils Absolute: 0 10*3/uL (ref 0.0–0.7)
Eosinophils Relative: 0.7 % (ref 0.0–5.0)
HCT: 34.8 % — ABNORMAL LOW (ref 39.0–52.0)
Hemoglobin: 11.6 g/dL — ABNORMAL LOW (ref 13.0–17.0)
Lymphocytes Relative: 29.8 % (ref 12.0–46.0)
Lymphs Abs: 2 10*3/uL (ref 0.7–4.0)
MCHC: 33.5 g/dL (ref 30.0–36.0)
MCV: 98.6 fl (ref 78.0–100.0)
Monocytes Absolute: 0 10*3/uL — ABNORMAL LOW (ref 0.1–1.0)
Monocytes Relative: 0.5 % — ABNORMAL LOW (ref 3.0–12.0)
Neutro Abs: 4.6 10*3/uL (ref 1.4–7.7)
Neutrophils Relative %: 68.6 % (ref 43.0–77.0)
Platelets: 254 10*3/uL (ref 150.0–400.0)
RBC: 3.53 Mil/uL — ABNORMAL LOW (ref 4.22–5.81)
RDW: 15.7 % — ABNORMAL HIGH (ref 11.5–14.6)
WBC: 6.6 10*3/uL (ref 4.5–10.5)

## 2012-03-21 LAB — BASIC METABOLIC PANEL
BUN: 34 mg/dL — ABNORMAL HIGH (ref 6–23)
CO2: 22 mEq/L (ref 19–32)
Calcium: 10.1 mg/dL (ref 8.4–10.5)
Chloride: 103 mEq/L (ref 96–112)
Creatinine, Ser: 1.5 mg/dL (ref 0.4–1.5)
GFR: 48.43 mL/min — ABNORMAL LOW (ref >60.00)
Glucose, Bld: 112 mg/dL — ABNORMAL HIGH (ref 70–99)
Potassium: 5.2 mEq/L — ABNORMAL HIGH (ref 3.5–5.1)
Sodium: 132 mEq/L — ABNORMAL LOW (ref 135–145)

## 2012-03-21 LAB — TSH: TSH: 1.84 u[IU]/mL (ref 0.35–5.50)

## 2012-03-21 LAB — BRAIN NATRIURETIC PEPTIDE: Pro B Natriuretic peptide (BNP): 47 pg/mL (ref 0.0–100.0)

## 2012-03-21 NOTE — Telephone Encounter (Signed)
Message given to Dr. Ninetta Lights and he will address it with the Dr. Luiz Blare

## 2012-03-21 NOTE — Progress Notes (Signed)
Patient ID: Timothy [name], male   DOB: 1928-08-30, 78 y.o.   MRN: [id number]  INFECTIOUS DISEASE PROGRESS NOTE    Subjective: Timothy [name] is in for his hospital followup visit. He was hospitalized with osteomyelitis of his left great toe and underwent first ray amputation on April 16. Operative cultures grew Pseudomonas. He also had diarrhea and his C. difficile PCR was positive. Because of concerns about the potential for residual deep infection he was discharged on IV cefepime as well as oral Flagyl. He went home on April 27 and at that point was feeling better with no diarrhea. On April 29 he had 4 watery bowel movements and took 2 doses of Lomotil. He has not had any diarrhea since that time. He is very weak but is generally feeling a little better. He has not had any nausea or vomiting. He has not had any fever.  Objective: Temp: 97.7 F (36.5 C) (05/02 1108) Temp src: Oral (05/02 1108) BP: 111/67 mmHg (05/02 1108) Pulse Rate: 90  (05/02 1108)  General: He is pale and weak Skin: His right arm PICC site appears normal Abdomen: Soft and nontender Left foot: The sutures remain in the surgical incision. The incision looks good and there is no drainage or surrounding cellulitis.  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 Although his infection may now be cured, I spoke with my partner, Dr. Ninetta Lights, who was concerned about the potential for persistence of the infection and had recommended 6 weeks of IV therapy. He may not tolerate that duration of IV cefepime if his diarrhea comes back. He seems to be doing better at this point and is  willing to continue both the IV cefepime and the Flagyl. I instructed him not to take anymore Lomotil or other antimotility agents because it can exacerbate his C. difficile colitis. I asked him to call us if he has any more diarrhea at which point we will need to consider early discontinuation of the cefepime.  Plan: 1. Continue IV cefepime and oral Flagyl for now 2. Repeat sedimentation rate and C-reactive protein 3. He is to call immediately if he has more diarrhea 4. Return in 2 weeks   Timothy Asters, MD [facility] [phone] pager   [phone] cell 03/21/2012, 12:06 PM

## 2012-03-22 LAB — SEDIMENTATION RATE: Sed Rate: 53 mm/hr — ABNORMAL HIGH (ref 0–16)

## 2012-04-01 ENCOUNTER — Encounter: Payer: Self-pay | Admitting: Oncology

## 2012-04-04 ENCOUNTER — Other Ambulatory Visit: Payer: Self-pay | Admitting: *Deleted

## 2012-04-04 ENCOUNTER — Other Ambulatory Visit (HOSPITAL_BASED_OUTPATIENT_CLINIC_OR_DEPARTMENT_OTHER): Payer: Medicare Other

## 2012-04-04 ENCOUNTER — Ambulatory Visit: Payer: Medicare Other

## 2012-04-04 DIAGNOSIS — C9000 Multiple myeloma not having achieved remission: Secondary | ICD-10-CM

## 2012-04-04 LAB — CBC WITH DIFFERENTIAL/PLATELET
Basophils Absolute: 0 10*3/uL (ref 0.0–0.1)
EOS%: 1.2 % (ref 0.0–7.0)
Eosinophils Absolute: 0.1 10*3/uL (ref 0.0–0.5)
HCT: 32.2 % — ABNORMAL LOW (ref 38.4–49.9)
HGB: 10.7 g/dL — ABNORMAL LOW (ref 13.0–17.1)
MCH: 32.8 pg (ref 27.2–33.4)
MCV: 98.1 fL — ABNORMAL HIGH (ref 79.3–98.0)
NEUT%: 57 % (ref 39.0–75.0)
lymph#: 1.4 10*3/uL (ref 0.9–3.3)

## 2012-04-04 LAB — PROTEIN / CREATININE RATIO, URINE: Creatinine, Urine: 19.4 mg/dL

## 2012-04-04 NOTE — Progress Notes (Signed)
Patient had blood work done.  Wife spoke to [facility] and stated to her that her husband wasn't getting the shot today.  Patient and wife left.  Dr Magrinat's nurse notified.

## 2012-04-05 ENCOUNTER — Encounter: Payer: Self-pay | Admitting: Infectious Diseases

## 2012-04-08 LAB — KAPPA/LAMBDA LIGHT CHAINS
Kappa free light chain: 18.7 mg/dL — ABNORMAL HIGH (ref 0.33–1.94)
Kappa:Lambda Ratio: 5.81 — ABNORMAL HIGH (ref 0.26–1.65)
Lambda Free Lght Chn: 3.22 mg/dL — ABNORMAL HIGH (ref 0.57–2.63)

## 2012-04-08 LAB — COMPREHENSIVE METABOLIC PANEL
Albumin: 3.5 g/dL (ref 3.5–5.2)
Alkaline Phosphatase: 46 U/L (ref 39–117)
BUN: 30 mg/dL — ABNORMAL HIGH (ref 6–23)
CO2: 23 mEq/L (ref 19–32)
Calcium: 9.2 mg/dL (ref 8.4–10.5)
Glucose, Bld: 96 mg/dL (ref 70–99)
Potassium: 4.1 mEq/L (ref 3.5–5.3)

## 2012-04-08 LAB — PROTEIN ELECTROPHORESIS, SERUM
Albumin ELP: 56.2 % (ref 55.8–66.1)
Alpha-1-Globulin: 4.2 % (ref 2.9–4.9)
Total Protein, Serum Electrophoresis: 6.7 g/dL (ref 6.0–8.3)

## 2012-04-11 ENCOUNTER — Other Ambulatory Visit (HOSPITAL_BASED_OUTPATIENT_CLINIC_OR_DEPARTMENT_OTHER): Payer: Medicare Other | Admitting: Lab

## 2012-04-11 ENCOUNTER — Ambulatory Visit (HOSPITAL_BASED_OUTPATIENT_CLINIC_OR_DEPARTMENT_OTHER): Payer: Medicare Other | Admitting: Oncology

## 2012-04-11 DIAGNOSIS — C9000 Multiple myeloma not having achieved remission: Secondary | ICD-10-CM

## 2012-04-11 DIAGNOSIS — IMO0001 Reserved for inherently not codable concepts without codable children: Secondary | ICD-10-CM

## 2012-04-11 DIAGNOSIS — N289 Disorder of kidney and ureter, unspecified: Secondary | ICD-10-CM

## 2012-04-11 DIAGNOSIS — I10 Essential (primary) hypertension: Secondary | ICD-10-CM

## 2012-04-11 DIAGNOSIS — I2510 Atherosclerotic heart disease of native coronary artery without angina pectoris: Secondary | ICD-10-CM

## 2012-04-11 DIAGNOSIS — I255 Ischemic cardiomyopathy: Secondary | ICD-10-CM

## 2012-04-11 DIAGNOSIS — N19 Unspecified kidney failure: Secondary | ICD-10-CM

## 2012-04-11 DIAGNOSIS — D649 Anemia, unspecified: Secondary | ICD-10-CM

## 2012-04-11 DIAGNOSIS — I5022 Chronic systolic (congestive) heart failure: Secondary | ICD-10-CM

## 2012-04-11 DIAGNOSIS — Z951 Presence of aortocoronary bypass graft: Secondary | ICD-10-CM

## 2012-04-11 LAB — CBC WITH DIFFERENTIAL/PLATELET
BASO%: 0.4 % (ref 0.0–2.0)
HCT: 27.5 % — ABNORMAL LOW (ref 38.4–49.9)
LYMPH%: 36.8 % (ref 14.0–49.0)
MCHC: 34.9 g/dL (ref 32.0–36.0)
MCV: 91.4 fL (ref 79.3–98.0)
MONO%: 12.2 % (ref 0.0–14.0)
NEUT%: 49.1 % (ref 39.0–75.0)
Platelets: 142 10*3/uL (ref 140–400)
RBC: 3.01 10*6/uL — ABNORMAL LOW (ref 4.20–5.82)
nRBC: 0 % (ref 0–0)

## 2012-04-11 MED ORDER — CITALOPRAM HYDROBROMIDE 10 MG PO TABS: 20.0000 mg | ORAL_TABLET | Freq: Every day | ORAL | Status: DC

## 2012-04-11 MED ORDER — DARBEPOETIN ALFA-POLYSORBATE 300 MCG/0.6ML IJ SOLN
300.0000 ug | Freq: Once | INTRAMUSCULAR | Status: AC
  Administered 2012-04-11: 300 ug via SUBCUTANEOUS
  Filled 2012-04-11: qty 0.6

## 2012-04-11 MED ORDER — MIRTAZAPINE 7.5 MG PO TABS: 7.5000 mg | ORAL_TABLET | Freq: Every day | ORAL | Status: DC

## 2012-04-11 MED ORDER — HYDROMORPHONE HCL 4 MG PO TABS: 4.0000 mg | ORAL_TABLET | ORAL | Status: DC | PRN

## 2012-04-11 NOTE — Progress Notes (Signed)
ID: Timothy [name]   DOB: 04-Feb-1928  MR#: [id number]  AOZ#:[id number]  HISTORY OF PRESENT ILLNESS: Patient presented in April 2007 with symptoms of symptomatic slowly progressive anemia with iron parameters, folic acid, and vitamin B12 within normal limits. Plans were made to obtain bone marrow aspirate and biopsy as an outpatient, however the patient was hospitalized shortly thereafter with a creatinine of 8.6. Renal biopsy demonstrated the presence of  light chain deposition disease. Bone marrow biopsy demonstrated 37% plasma cells.l. A skeletal survey showed small calvarial lytic lesions, mild osteopenia, and cervical spondylosis. With a well-established diagnosis of light-chain multiple myeloma [name] subsequent treatments are as detailed below.   INTERVAL HISTORY: Take returns today with his wife, [name], for followup of his multiple myeloma and his left foot ulcer. This was recently debrided and cultured. He has an appointment with our wound clinic next week.  REVIEW OF SYSTEMS: At the last visit here he was very depressed and hypotensive. Part of this early was going to be due to renal insufficiency since we have stopped his weekly dexamethasone. I put him on prednisone at low dose for 5 days and he is currently on an every other day taper. I also started him on hydrocortisone. His blood pressure is better, he appears much more interactive, and there is no evidence of overt depression, although he is certainly very frustrated with his foot situation. He is sleeping poorly, and for some reason he is no longer taking gabapentin at bedtime. I encouraged him to resume that. He is short of breath when walking up stairs which is not a new symptom. He has had some loose bowel movements. Previously he had been constipated secondary to dilated but he interrupted that medication. He continues to have back pain but no fevers, no rash, no nausea or vomiting. A detailed review of systems was otherwise  stable.  PAST MEDICAL HISTORY: Past Medical History  Diagnosis Date  . ASCVD (arteriosclerotic cardiovascular disease)   . Anemia     chronic mild anemia  . Gout   . Hypertension   . Lumbar disc disease     post laminectomy  . Chronic back pain   . History of echocardiogram 11/02/2010    EF range of 30 to 35% / There is hypokinsesis of the basal-mild inferolateral myocardium  . CHF (congestive heart failure)   . Heart murmur   . Ischemic cardiomyopathy   . Renal insufficiency   . Peripheral neuropathy   . Inferior myocardial infarction 1986  . Hyperlipidemia   . SOB (shortness of breath)   . Fatigue   . Peripheral neuropathy   . Coronary artery disease     CABG 2002 by [name] with LIMA to LAD, SVG to intermediate, SVG to LCX, & SVG to PL. Last nuclear in 2012 showing large inferior scar with EF of 39%.   . Blood transfusion     DEC 2012 - TWO UNITS  . 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  PT HAS BEEN OFF CHEMO SINCE HIS HOSPITALIZATION FEB 2013 FOR LEFT FOOT INFECTION  . Cancer   . History of shingles MARCH 2009    SHINGLE LESIONS WERE AROUND RIGHT EYE--PT HAS RESIDUAL ITCHING AROUND THE EYE.  . Osteomyelitis     s/p toe amputation  . Chronic systolic heart failure     EF of 30% per echo 02/2012  . Protein malnutrition    PAST SURGICAL HISTORY: Past  Surgical History  Procedure Date  . Laminectomy   . Inguinal hernia repair   . Cardiac catheterization 06/20/2000    Severe coronary disease (totally occluded right artery, 90% left circumflex, 50-60% intermediate, and 90-95% ostial left anterior descending)   . Tonsillectomy   . Coronary artery bypass graft 2002    x4 / Left internal mammary artery to the LAD.  / Saphenous vein graft to the right posterolateral.  /  Saphenous vein graft to  the ramus intermedius. / Saphenous vein graft to the circumflex marginal     . Amputation 03/06/2012    Procedure:  AMPUTATION RAY;  Surgeon: Harvie Junior, MD;  Location: [facility];  Service: Orthopedics;  Laterality: Left;  First Ray Amputation    FAMILY HISTORY No family history of hematologic malignancies; brother had prostate cancer; no other cancers in the immediate family  SOCIAL HISTORY: Retired International aid/development worker; children from prior marriage   ADVANCED DIRECTIVES:  HEALTH MAINTENANCE: History  Substance Use Topics  . Smoking status: Former Smoker -- 1.0 packs/day for 20 years    Types: Cigarettes    Quit date: 11/20/1984  . Smokeless tobacco: Never Used  . Alcohol Use: No     Colonoscopy:  PAS:  Bone density:  Lipid panel:  Allergies  Allergen Reactions  . Morphine And Related Other (See Comments)    Could not seat still    Current Outpatient Prescriptions  Medication Sig Dispense Refill  . allopurinol (ZYLOPRIM) 300 MG tablet Take 150 mg by mouth daily.       Marland Kitchen aspirin 81 MG tablet Take 1 tablet (81 mg total) by mouth daily.  30 tablet  0  . atorvastatin (LIPITOR) 40 MG tablet Take 20 mg by mouth daily.       . B Complex-Biotin-FA (B COMPLETE PO) Take 0.5 tablets by mouth 2 (two) times daily.       . carvedilol (COREG) 3.125 MG tablet Take 1 tablet (3.125 mg total) by mouth 2 (two) times daily with a meal.  180 tablet  3  . citalopram (CELEXA) 10 MG tablet Take 10 mg by mouth at bedtime.      . Coenzyme Q10 (CO Q-10 PO) Take 1 tablet by mouth daily.       Marland Kitchen dextrose 5 % SOLN 50 mL with ceFEPIme 1 G SOLR 1 g Inject 1 g into the vein every 12 (twelve) hours.  90 Units  0  . furosemide (LASIX) 40 MG tablet Take 1 tablet (40 mg total) by mouth daily.  30 tablet  1  . lisinopril (PRINIVIL,ZESTRIL) 20 MG tablet Take 20 mg by mouth daily.      . mirtazapine (REMERON) 7.5 MG tablet Take 1 tablet (7.5 mg total) by mouth at bedtime.  30 tablet  0  . multivitamin (THERAGRAN) per tablet Take 0.5 tablets by mouth 2 (two) times daily.       . Omega-3 Fatty Acids (FISH OIL PO) Take 1 capsule by  mouth 2 (two) times daily.       . Tamsulosin HCl (FLOMAX) 0.4 MG CAPS Take 1 capsule (0.4 mg total) by mouth daily.  90 capsule  1  . valACYclovir (VALTREX) 500 MG tablet Take 500 mg by mouth daily. CONT.      Marland Kitchen vitamin C (ASCORBIC ACID) 500 MG tablet Take 500 mg by mouth daily.      . Zinc 50 MG TABS Take 50 mg by mouth daily.      Marland Kitchen HYDROmorphone (DILAUDID)  4 MG tablet Take 1 tablet (4 mg total) by mouth every 4 (four) hours as needed.  40 tablet  0    OBJECTIVE: Elderly white male who appears uncomfortable Filed Vitals:   04/11/12 1339  BP: 126/58  Pulse: 53  Temp: 97.3 F (36.3 C)     Body mass index is 23.69 kg/(m^2).    ECOG FS: 2  Sclerae unicteric Oropharynx clear No peripheral adenopathy Lungs no rales or rhonchi Heart regular rate and rhythm Abd soft, nontender, positive bowel sounds  MSK no focal spinal tenderness, no peripheral edema; eye on bandage his left foot ulcer, which is separately photographed. Neuro: nonfocal  LAB RESULTS: Lab Results  Component Value Date   WBC 4.8 04/11/2012   NEUTROABS 2.3 04/11/2012   HGB 9.6* 04/11/2012   HCT 27.5* 04/11/2012   MCV 91.4 04/11/2012   PLT 142 04/11/2012      Chemistry      Component Value Date/Time   NA 125* 04/04/2012 1108   K 4.1 04/04/2012 1108   CL 97 04/04/2012 1108   CO2 23 04/04/2012 1108   BUN 30* 04/04/2012 1108   CREATININE 1.00 04/04/2012 1108      Component Value Date/Time   CALCIUM 9.2 04/04/2012 1108   ALKPHOS 46 04/04/2012 1108   AST 31 04/04/2012 1108   ALT 21 04/04/2012 1108   BILITOT 0.4 04/04/2012 1108       No results found for this basename: LABCA2    No components found with this basename: [id number]    No results found for this basename: INR:1;PROTIME:1 in the last 168 hours  Urinalysis    Component Value Date/Time   COLORURINE YELLOW 03/10/2012 2006    STUDIES: Ct Foot Left Wo Contrast  01/10/2012  *RADIOLOGY REPORT*  Clinical Data: Ulcer on left foot.  History of multiple myeloma.   CT OF THE LEFT FOOT WITHOUT CONTRAST  Technique:  Multidetector CT imaging was performed according to the standard protocol. Multiplanar CT image reconstructions were also generated.  Comparison: 01/10/2012  Findings: There is abnormal gas in the soft tissues plantar to the sesamoids, indicative of ulceration.  Gas is also present in the subcutaneous tissues along the medial ball of the foot on image 35 of series 607, with edema in the surrounding soft tissues.  No obvious drainable abscess is not observed although IV contrast was not administered.  There is atrophy of the plantar musculature of the foot.  Achilles calcaneal spur noted with some faint calcification along the plantar fascia.  No malalignment at the Lisfranc joint is noted. There is sclerosis of the medial sesamoid bone, which could be an indication of sesamoiditis, but is nonspecific with respect to osteomyelitis.  No bony destructive findings specific for osteomyelitis are noted.  There is spurring at the interphalangeal joint great toe.  The metatarsophalangeal joints are prominently extended during imaging.  Considerable subcutaneous edema is present along the ankle, with low-level edema tracking in Kager's fat pad.  IMPRESSION:  1.  Gas in the medial soft tissues of the ball of the foot, compatible with regional ulceration.  There is considerable surrounding subcutaneous edema, and cellulitis is suspected.  No definite drainable abscess is observed. 2.  There is sclerosis of the medial sesamoid which may indicate sesamoiditis, but which is not specific for osteomyelitis.  No bony destructive findings are observed. 3.  Nonspecific subcutaneous edema overlying the left side.  Original Report Authenticated By: Dellia Cloud, M.D.    ASSESSMENT: 78 year-old  with kappa light chain multiple myeloma  (1) presenting April 2007 with anemia and renal failure; renal Bx showing free kappa light chain deposition; bone marrow biopsy showing 37%  plasma cells, with normal cytogenetics and FISH;bone survey showing skull lytic lesions; treated with  (2) thalidomide (200 mg/d) and dexamethasone (40 mg/d x4d Q28d) June through Nov 2007, with good response, but poor tolerance;  (3) thalidomide decreased to 100 mg/d, dexamethasone continued, bortezomib (IV) added, Dec 2007 to March 2008  (4) treatment interrupted by multiple complications (peripheral neuropathy, pulmonary embolism, diverticular abscess, CN V zoster, severe DDD, congestive heart failure, rising PSA); maintenance zolendronic acid through March 2012  (5) progression April 2012, treated initially with dexamethasone alone, poorly tolerated;  (6) bortezomib (sq) resumed July 2012; cyclophosphamide and dexamethasone added Sept 2012; zolendronic acid changed to Q 3months; all treatments held as of March 2013 due to the development of the Left foot ulcer described above (7) aranesp started December 2012   PLAN: He has now been off his myeloma treatment for about a month. He is on a prednisone taper, but will continue on the fludrocortisone unless he becomes hypertensive. It is a little early for him to be responding to the Celexa, but I hope this will improve his mood as she goes through the next few frustrating months trying to get his left foot ulcer to heal.  I have recommended that he go back to gabapentin 300-60 mg at bedtime, and we will continue to follow his labs here on a monthly basis. He was seen again in 3 months. He knows to call for any problems that may develop before the next visit   Jahmel Flannagan C    04/11/2012

## 2012-04-16 ENCOUNTER — Encounter: Payer: Self-pay | Admitting: Nurse Practitioner

## 2012-04-16 ENCOUNTER — Ambulatory Visit (INDEPENDENT_AMBULATORY_CARE_PROVIDER_SITE_OTHER): Payer: Medicare Other | Admitting: Nurse Practitioner

## 2012-04-16 VITALS — BP 120/56 | HR 55 | Ht 70.0 in | Wt 166.0 lb

## 2012-04-16 DIAGNOSIS — I2589 Other forms of chronic ischemic heart disease: Secondary | ICD-10-CM

## 2012-04-16 DIAGNOSIS — I255 Ischemic cardiomyopathy: Secondary | ICD-10-CM

## 2012-04-16 MED ORDER — FUROSEMIDE 40 MG PO TABS: 20.0000 mg | ORAL_TABLET | Freq: Every day | ORAL | Status: DC

## 2012-04-16 NOTE — Progress Notes (Signed)
[name] Date of Birth: 1927-11-24 Medical Record #[id number]  History of Present Illness: Timothy [name] is seen back today for a 3 week check. He is seen for Dr. [place]. He has a complex medical history which is outlined below. He has an ischemic CM with an EF of 30%. His other main issues include multiple myeloma, HLD, depression and recent toe amputation for osteomyelitis.   He comes in today. He is here with his wife. He is doing so much better. Spirit seems brighter. He has gained a little weight. BNP was normal when checked 3 weeks ago. He is almost finished with his antibiotics for his foot and will have his PICC removed. He is not as weak. Not as depressed. Walking a little more. Appetite is still off but he has gained 4 pounds. His breathing is ok. Still with decreased endurance. He says he is going to mow the grass tomorrow. He may be restarting his chemo in about another month. He remains on low dose Lasix.   Current Outpatient Prescriptions on File Prior to Visit  Medication Sig Dispense Refill  . allopurinol (ZYLOPRIM) 300 MG tablet Take 150 mg by mouth daily.       Marland Kitchen aspirin 81 MG tablet Take 1 tablet (81 mg total) by mouth daily.  30 tablet  0  . atorvastatin (LIPITOR) 40 MG tablet Take 20 mg by mouth daily.       . B Complex-Biotin-FA (B COMPLETE PO) Take 0.5 tablets by mouth 2 (two) times daily.       . carvedilol (COREG) 3.125 MG tablet Take 1 tablet (3.125 mg total) by mouth 2 (two) times daily with a meal.  180 tablet  3  . citalopram (CELEXA) 10 MG tablet Take 2 tablets (20 mg total) by mouth at bedtime. Take one tablet daily  90 tablet  12  . Coenzyme Q10 (CO Q-10 PO) Take 1 tablet by mouth daily.       Marland Kitchen dextrose 5 % SOLN 50 mL with ceFEPIme 1 G SOLR 1 g Inject 1 g into the vein every 12 (twelve) hours.  90 Units  0  . HYDROmorphone (DILAUDID) 4 MG tablet Take 1 tablet (4 mg total) by mouth every 4 (four) hours as needed.  90 tablet  0  . lisinopril (PRINIVIL,ZESTRIL) 20 MG  tablet Take 20 mg by mouth daily.      . mirtazapine (REMERON) 7.5 MG tablet Take 1 tablet (7.5 mg total) by mouth at bedtime.  9 tablet  0  . multivitamin (THERAGRAN) per tablet Take 0.5 tablets by mouth 2 (two) times daily.       . Omega-3 Fatty Acids (FISH OIL PO) Take 1 capsule by mouth 2 (two) times daily.       . Tamsulosin HCl (FLOMAX) 0.4 MG CAPS Take 1 capsule (0.4 mg total) by mouth daily.  90 capsule  1  . valACYclovir (VALTREX) 500 MG tablet Take 500 mg by mouth daily. CONT.      Marland Kitchen vitamin C (ASCORBIC ACID) 500 MG tablet Take 500 mg by mouth daily.      . Zinc 50 MG TABS Take 50 mg by mouth daily.      Marland Kitchen DISCONTD: furosemide (LASIX) 40 MG tablet Take 1 tablet (40 mg total) by mouth daily.  30 tablet  1    Allergies  Allergen Reactions  . Morphine And Related Other (See Comments)    Could not seat still    Past Medical History  Diagnosis Date  . ASCVD (arteriosclerotic cardiovascular disease)   . Anemia     chronic mild anemia  . Gout   . Hypertension   . Lumbar disc disease     post laminectomy  . Chronic back pain   . History of echocardiogram 11/02/2010    EF range of 30 to 35% / There is hypokinsesis of the basal-mild inferolateral myocardium  . CHF (congestive heart failure)   . Heart murmur   . Ischemic cardiomyopathy   . Renal insufficiency   . Peripheral neuropathy   . Inferior myocardial infarction 1986  . Hyperlipidemia   . SOB (shortness of breath)   . Fatigue   . Peripheral neuropathy   . Coronary artery disease     CABG 2002 by [name] with LIMA to LAD, SVG to intermediate, SVG to LCX, & SVG to PL. Last nuclear in 2012 showing large inferior scar with EF of 39%.   . Blood transfusion     DEC 2012 - TWO UNITS  . 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  PT HAS BEEN OFF CHEMO SINCE HIS HOSPITALIZATION FEB 2013 FOR LEFT FOOT INFECTION  . Cancer   . History of shingles MARCH 2009    SHINGLE  LESIONS WERE AROUND RIGHT EYE--PT HAS RESIDUAL ITCHING AROUND THE EYE.  . Osteomyelitis     s/p toe amputation  . Chronic systolic heart failure     EF of 30% per echo 02/2012  . Protein malnutrition     Past Surgical History  Procedure Date  . Laminectomy   . Inguinal hernia repair   . Cardiac catheterization 06/20/2000    Severe coronary disease (totally occluded right artery, 90% left circumflex, 50-60% intermediate, and 90-95% ostial left anterior descending)   . Tonsillectomy   . Coronary artery bypass graft 2002    x4 / Left internal mammary artery to the LAD.  / Saphenous vein graft to the right posterolateral.  /  Saphenous vein graft to  the ramus intermedius. / Saphenous vein graft to the circumflex marginal     . Amputation 03/06/2012    Procedure: AMPUTATION RAY;  Surgeon: Harvie Junior, MD;  Location: [facility];  Service: Orthopedics;  Laterality: Left;  First Ray Amputation    History  Smoking status  . Former Smoker -- 1.0 packs/day for 20 years  . Types: Cigarettes  . Quit date: 11/20/1984  Smokeless tobacco  . Never Used    History  Alcohol Use No    History reviewed. No pertinent family history.  Review of Systems: The review of systems is per the HPI.  He still remains very cold. TSH was normal. He has regular labs at the [facility]. All other systems were reviewed and are negative.  Physical Exam: BP 120/56  Pulse 55  Ht 5\' 10"  (1.778 m)  Wt 166 lb (75.297 kg)  BMI 23.82 kg/m2 Patient is very pleasant and in no acute distress. He looks stronger today and looks like he feels better. Skin is warm and dry. Color is normal.  HEENT is unremarkable. Normocephalic/atraumatic. PERRL. Sclera are nonicteric. Neck is supple. No masses. No JVD. Lungs are clear. Cardiac exam shows a regular rate and rhythm. Abdomen is soft. Extremities are without edema. Gait and ROM are intact. No gross neurologic deficits noted.   LABORATORY DATA:  Lab Results  Component  Value Date   WBC 4.8 04/11/2012   HGB 9.6* 04/11/2012  HCT 27.5* 04/11/2012   PLT 142 04/11/2012   GLUCOSE 96 04/04/2012   ALT 21 04/04/2012   AST 31 04/04/2012   NA 125* 04/04/2012   K 4.1 04/04/2012   CL 97 04/04/2012   CREATININE 1.00 04/04/2012   BUN 30* 04/04/2012   CO2 23 04/04/2012   TSH 1.84 03/20/2012   PSA 3.75 05/30/2006   INR 1.10 03/01/2012     Assessment / Plan:

## 2012-04-16 NOTE — Patient Instructions (Addendum)
I am pleased with how you are doing!!!  Stay on your current medicines  I want to see you in a month  Call the [facility] Care office at [phone] if you have any questions, problems or concerns.

## 2012-04-16 NOTE — Assessment & Plan Note (Signed)
He does look better today. Seems to be getting around better and more motivated to do things. I have left him on his current regimen for now. Would like to see him back in a month with plans of increasing his CHF meds at that time. He may be restarting his chemo and that may affect his blood pressure. Nutrition is still imperative. Activity is also encouraged but overall he has perked up quite well over this past three weeks. Patient is agreeable to this plan and will call if any problems develop in the interim.

## 2012-04-17 ENCOUNTER — Encounter: Payer: Self-pay | Admitting: Infectious Diseases

## 2012-04-18 ENCOUNTER — Ambulatory Visit (INDEPENDENT_AMBULATORY_CARE_PROVIDER_SITE_OTHER): Payer: Medicare Other | Admitting: Internal Medicine

## 2012-04-18 ENCOUNTER — Encounter: Payer: Self-pay | Admitting: Internal Medicine

## 2012-04-18 VITALS — BP 121/62 | HR 59 | Temp 97.6°F | Ht 70.0 in | Wt 166.0 lb

## 2012-04-18 DIAGNOSIS — M869 Osteomyelitis, unspecified: Secondary | ICD-10-CM

## 2012-04-18 NOTE — Progress Notes (Signed)
RN received verbal order to discontinue the patient's PICC line.  Patient identified with name and date of birth. PICC dressing removed, site unremarkable.  PICC line removed using sterile procedure @ 1100. PICC length equal to that noted in patient's hospital chart of 38 cm. Sterile petroleum gauze + sterile 4X4 applied to PICC site, pressure applied for 10 minutes and covered with Medipore tape as a pressure dressing. Patient tolerated procedure without complaints.  Patient instructed to limit use of arm for 1 hour. Patient instructed that the pressure dressing should remain in place for 24 hours. Patient verbalized understanding of these instructions.  

## 2012-04-18 NOTE — Progress Notes (Signed)
Patient ID: Timothy [name], male   DOB: 1928/02/19, 78 y.o.   MRN: [id number]     [facility]  Patient Active Problem List  Diagnoses  . Ischemic cardiomyopathy  . Multiple myeloma  . Shortness of breath dyspnea  . Hypertension  . Renal insufficiency  . Myeloma  . Anemia  . Systolic CHF, chronic  . CAD (coronary artery disease)  . S/P CABG (coronary artery bypass graft)  . Foot ulcer, left  . Hyponatremia  . Osteomyelitis of left foot  . Open wound of left foot  . Acute on chronic systolic congestive heart failure  . Hypokalemia    Patient's Medications  New Prescriptions   No medications on file  Previous Medications   ALLOPURINOL (ZYLOPRIM) 300 MG TABLET    Take 150 mg by mouth daily.    ASPIRIN 81 MG TABLET    Take 1 tablet (81 mg total) by mouth daily.   ATORVASTATIN (LIPITOR) 40 MG TABLET    Take 20 mg by mouth daily.    B COMPLEX-BIOTIN-FA (B COMPLETE PO)    Take 0.5 tablets by mouth 2 (two) times daily.    CARVEDILOL (COREG) 3.125 MG TABLET    Take 1 tablet (3.125 mg total) by mouth 2 (two) times daily with a meal.   CITALOPRAM (CELEXA) 10 MG TABLET    Take 2 tablets (20 mg total) by mouth at bedtime. Take one tablet daily   COENZYME Q10 (CO Q-10 PO)    Take 1 tablet by mouth daily.    FUROSEMIDE (LASIX) 40 MG TABLET    Take 0.5 tablets (20 mg total) by mouth daily.   HYDROMORPHONE (DILAUDID) 4 MG TABLET    Take 1 tablet (4 mg total) by mouth every 4 (four) hours as needed.   LISINOPRIL (PRINIVIL,ZESTRIL) 20 MG TABLET    Take 20 mg by mouth daily.   MIRTAZAPINE (REMERON) 7.5 MG TABLET    Take 1 tablet (7.5 mg total) by mouth at bedtime.   MULTIVITAMIN (THERAGRAN) PER TABLET    Take 0.5 tablets by mouth 2 (two) times daily.    OMEGA-3 FATTY ACIDS (FISH OIL PO)    Take 1 capsule by mouth 2 (two) times daily.    TAMSULOSIN HCL (FLOMAX) 0.4 MG CAPS    Take 1 capsule (0.4 mg total) by mouth daily.   VALACYCLOVIR (VALTREX) 500 MG TABLET    Take 500  mg by mouth daily. CONT.   VITAMIN C (ASCORBIC ACID) 500 MG TABLET    Take 500 mg by mouth daily.   ZINC 50 MG TABS    Take 50 mg by mouth daily.  Modified Medications   No medications on file  Discontinued Medications   No medications on file    Subjective: Timothy [name] is in for his routine visit. He completed 6 weeks of cefepime yesterday. His left foot wound has completely healed and his sutures are out. He is feeling much better. He has completed his metronidazole as well and has not had any further diarrhea.  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 I will have his PICC removed and continue  observation off of antibiotics. He has had no further problems with C. difficile colitis.  Plan: 1. Observe off of antibiotics 2. Removed PICC 3. Followup here as needed   Cliffton Asters, MD [facility] [phone] pager   [phone] cell 04/18/2012, 10:34 AM

## 2012-04-21 ENCOUNTER — Other Ambulatory Visit: Payer: Self-pay | Admitting: Oncology

## 2012-04-21 DIAGNOSIS — C9000 Multiple myeloma not having achieved remission: Secondary | ICD-10-CM

## 2012-04-21 NOTE — Progress Notes (Signed)
ID: [name]   DOB: 1928-01-30  MR#: [id number]  WUJ#:[id number]  HISTORY OF PRESENT ILLNESS: Patient presented in April 2007 with symptomatic slowly progressive anemia, with normal iron parameters, folic acid, and vitamin B12. Plans were made to obtain bone marrow aspirate and biopsy as an outpatient, however the patient was hospitalized shortly thereafter with a creatinine of 8.6. Renal biopsy demonstrated the presence of  light chain deposition disease. Bone marrow biopsy demonstrated 37% plasma cells. A skeletal survey showed small calvarial lytic lesions, mild osteopenia, and cervical spondylosis. With a well-established diagnosis of light-chain multiple myeloma [name] subsequent treatments are as detailed below.   INTERVAL HISTORY: Timothy [name] returns today with his wife, Timothy [name], and their nephew Timothy [name] for followup of his multiple myeloma . Since her last visit here he has had intensive treatments for his left foot ulcer through the wound clinic. His foot is now "healed", with some stitches out to be removed. He is using a walker at home I., rarely a wheelchair. He has been off treatment as far as his myeloma is concerned since March 2013.  REVIEW OF SYSTEMS: Though the left foot wound is considerably improved, the peripheral neuropathy continues to be "herbal". He takes that long head of 4 mg most days, sometimes twice daily. He is also on Celexa 20 mg daily, something that is not really benefiting his mood as much as one would hope. He has no energy very poor appetite, continued problems with back pain, and is nowhere near going back to his usual activities of gardening and golf. He is mostly staying around the house and is dependent on [place] and his nephew for some of his activities of daily living. He has had no fevers, rash, bleeding, or any change in bladder habits that he is aware of. A detailed review of systems was otherwise stable.  PAST MEDICAL HISTORY: Past Medical History    Diagnosis Date  . ASCVD (arteriosclerotic cardiovascular disease)   . Anemia     chronic mild anemia  . Gout   . Hypertension   . Lumbar disc disease     post laminectomy  . Chronic back pain   . History of echocardiogram 11/02/2010    EF range of 30 to 35% / There is hypokinsesis of the basal-mild inferolateral myocardium  . CHF (congestive heart failure)   . Heart murmur   . Ischemic cardiomyopathy   . Renal insufficiency   . Peripheral neuropathy   . Inferior myocardial infarction 1986  . Hyperlipidemia   . SOB (shortness of breath)   . Fatigue   . Peripheral neuropathy   . Coronary artery disease     CABG 2002 by [name] with LIMA to LAD, SVG to intermediate, SVG to LCX, & SVG to PL. Last nuclear in 2012 showing large inferior scar with EF of 39%.   . Blood transfusion     DEC 2012 - TWO UNITS  . 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  PT HAS BEEN OFF CHEMO SINCE HIS HOSPITALIZATION FEB 2013 FOR LEFT FOOT INFECTION  . Cancer   . History of shingles MARCH 2009    SHINGLE LESIONS WERE AROUND RIGHT EYE--PT HAS RESIDUAL ITCHING AROUND THE EYE.  . Osteomyelitis     s/p toe amputation  . Chronic systolic heart failure     EF of 30% per echo 02/2012  . Protein malnutrition    PAST SURGICAL HISTORY: Past Surgical  History  Procedure Date  . Laminectomy   . Inguinal hernia repair   . Cardiac catheterization 06/20/2000    Severe coronary disease (totally occluded right artery, 90% left circumflex, 50-60% intermediate, and 90-95% ostial left anterior descending)   . Tonsillectomy   . Coronary artery bypass graft 2002    x4 / Left internal mammary artery to the LAD.  / Saphenous vein graft to the right posterolateral.  /  Saphenous vein graft to  the ramus intermedius. / Saphenous vein graft to the circumflex marginal     . Amputation 03/06/2012    Procedure: AMPUTATION RAY;  Surgeon: Harvie Junior, MD;  Location: [facility];   Service: Orthopedics;  Laterality: Left;  First Ray Amputation    FAMILY HISTORY No family history of hematologic malignancies; brother had prostate cancer; no other cancers in the immediate family  SOCIAL HISTORY: Retired International aid/development worker; children from prior marriage   ADVANCED DIRECTIVES:  HEALTH MAINTENANCE: History  Substance Use Topics  . Smoking status: Former Smoker -- 1.0 packs/day for 20 years    Types: Cigarettes    Quit date: 11/20/1984  . Smokeless tobacco: Never Used  . Alcohol Use: No     Colonoscopy:  PAS:  Bone density:  Lipid panel:  Allergies  Allergen Reactions  . Morphine And Related Other (See Comments)    Could not seat still    Current Outpatient Prescriptions  Medication Sig Dispense Refill  . allopurinol (ZYLOPRIM) 300 MG tablet Take 150 mg by mouth daily.       Marland Kitchen aspirin 81 MG tablet Take 1 tablet (81 mg total) by mouth daily.  30 tablet  0  . atorvastatin (LIPITOR) 40 MG tablet Take 20 mg by mouth daily.       . B Complex-Biotin-FA (B COMPLETE PO) Take 0.5 tablets by mouth 2 (two) times daily.       . carvedilol (COREG) 3.125 MG tablet Take 1 tablet (3.125 mg total) by mouth 2 (two) times daily with a meal.  180 tablet  3  . citalopram (CELEXA) 10 MG tablet Take 2 tablets (20 mg total) by mouth at bedtime. Take one tablet daily  90 tablet  12  . Coenzyme Q10 (CO Q-10 PO) Take 1 tablet by mouth daily.       . furosemide (LASIX) 40 MG tablet Take 0.5 tablets (20 mg total) by mouth daily.  90 tablet  2  . HYDROmorphone (DILAUDID) 4 MG tablet Take 1 tablet (4 mg total) by mouth every 4 (four) hours as needed.  90 tablet  0  . lisinopril (PRINIVIL,ZESTRIL) 20 MG tablet Take 20 mg by mouth daily.      . mirtazapine (REMERON) 7.5 MG tablet Take 1 tablet (7.5 mg total) by mouth at bedtime.  9 tablet  0  . multivitamin (THERAGRAN) per tablet Take 0.5 tablets by mouth 2 (two) times daily.       . Omega-3 Fatty Acids (FISH OIL PO) Take 1 capsule by mouth 2  (two) times daily.       . Tamsulosin HCl (FLOMAX) 0.4 MG CAPS Take 1 capsule (0.4 mg total) by mouth daily.  90 capsule  1  . valACYclovir (VALTREX) 500 MG tablet Take 500 mg by mouth daily. CONT.      Marland Kitchen vitamin C (ASCORBIC ACID) 500 MG tablet Take 500 mg by mouth daily.      . Zinc 50 MG TABS Take 50 mg by mouth daily.  OBJECTIVE: Elderly white male examined in a wheelchair There were no vitals filed for this visit.   There is no height or weight on file to calculate BMI.    ECOG FS: 2  Sclerae unicteric Oropharynx clear No peripheral adenopathy Lungs no rales or rhonchi Heart regular rate and rhythm Abd soft, nontender, positive bowel sounds  MSK no focal spinal tenderness, no peripheral edema; his left foot ulcer had just been bandaged and was not uncovered today. Neuro: nonfocal  LAB RESULTS: SPEP continues to show no monoclonal protein, though there is diffuse polyclonal increase in his immunoglobulins as expected given his infectious problem.  Results for Timothy [name], Timothy [name] (MRN [id number]) as of 04/21/2012 16:20  Ref. Range 09/20/2011 07:32 10/25/2011 10:25 11/22/2011 09:54 12/27/2011 09:41 04/04/2012 11:08  Kappa free light chain Latest Range: 0.33-1.94 mg/dL 41.32 (H) 44.01 (H) 02.72 (H) 23.30 (H) 18.70 (H)  Results for Timothy [name], Timothy [name] (MRN [id number]) as of 04/21/2012 16:20  Ref. Range 09/20/2011 07:32 10/25/2011 10:25 11/22/2011 09:54 12/27/2011 09:41 04/04/2012 11:08  Lambda Free Lght Chn Latest Range: 0.57-2.63 mg/dL 7.42 5.95 6.38 7.56 4.33 (H)  Results for Timothy [name], Timothy [name] (MRN [id number]) as of 04/21/2012 16:20  Ref. Range 10/16/2011 09:25 10/25/2011 10:25 11/22/2011 09:54 12/27/2011 09:39 04/04/2012 11:08  PROTEIN CREATININE RATIO Latest Range: <0.15  0.08 0.12 0.04 0.09 0.67 (H)    Lab Results  Component Value Date   WBC 4.8 04/11/2012   NEUTROABS 2.3 04/11/2012   HGB 9.6* 04/11/2012   HCT 27.5* 04/11/2012   MCV 91.4 04/11/2012   PLT 142 04/11/2012      Chemistry      Component  Value Date/Time   NA 125* 04/04/2012 1108   K 4.1 04/04/2012 1108   CL 97 04/04/2012 1108   CO2 23 04/04/2012 1108   BUN 30* 04/04/2012 1108   CREATININE 1.00 04/04/2012 1108      Component Value Date/Time   CALCIUM 9.2 04/04/2012 1108   ALKPHOS 46 04/04/2012 1108   AST 31 04/04/2012 1108   ALT 21 04/04/2012 1108   BILITOT 0.4 04/04/2012 1108       No results found for this basename: LABCA2    No components found with this basename: LABCA125    No results found for this basename: INR:1;PROTIME:1 in the last 168 hours  Urinalysis    Component Value Date/Time   COLORURINE YELLOW 03/10/2012 2006    STUDIES: Clinical Data: Dizziness. Hypertension. Shortness of breath  starting yesterday.  CHEST - 2 VIEW 03/10/2012 Comparison: 04/20/2011  Findings: Stable appearance of postoperative changes in the  mediastinum. Interval placement of a right PICC catheter with tip  over the distal SVC region. Increasing heart size and pulmonary  vascularity suggesting developing congestive failure. Small  bilateral pleural effusion. Bilateral basilar atelectasis. No  focal airspace consolidation in the lungs. No pneumothorax.  IMPRESSION:  Developing congestive changes in the heart and lungs without  obvious edema. Small bilateral pleural effusions and basilar  atelectasis.  Original Report Authenticated By: Marlon Pel, M.D.   ASSESSMENT: 78 year-old with kappa light chain multiple myeloma  (1) presenting April 2007 with anemia and renal failure; renal Bx showing free kappa light chain deposition; bone marrow biopsy showing 37% plasma cells, with normal cytogenetics and FISH;bone survey showing skull lytic lesions; treated with  (2) thalidomide (200 mg/d) and dexamethasone (40 mg/d x4d Q28d) June through Nov 2007, with good response, but poor tolerance;  (3) thalidomide decreased to 100 mg/d, dexamethasone continued, bortezomib (IV)  added, Dec 2007 to March 2008  (4) treatment interrupted  by multiple complications (peripheral neuropathy, pulmonary embolism, diverticular abscess, CN V zoster, severe DDD, congestive heart failure, rising PSA); maintenance zolendronic acid through March 2012  (5) progression April 2012, treated initially with dexamethasone alone, poorly tolerated;  (6) bortezomib (sq) resumed July 2012; cyclophosphamide and dexamethasone added Sept 2012; zolendronic acid changed to Q 3months; all treatments held as of March 2013 due to the development of the Left foot ulcer described above (7) aranesp started December 2012   PLAN:  The Light chains are actually slightly decreased. The increase in the protein creatinine ratio in his urine may be due to the polyclonal increase in gammaglobulins due to his infection. His creatinine remains excellent. In short while I am concerned that he is not receiving any treatment, there is no obvious worrisome disease progression at this point.  Accordingly, I would prefer to hold off on further treatment until his left foot ulcer has completely resolved and his functional status is somewhat improved. He will receive Aranesp today and we will arrange for him to receive this every 2 weeks to try to bring his hemoglobin up to the 11 0 range. He will have repeat lab work June 20 and see me again June 28. At that point we will consider resuming at least the dexamethasone.  In the meantime we upped his Celexa today to 60 mg daily. I renewed his dilated a prescription. He knows to call for any problems that may develop before the next visit   Icis Budreau C    04/21/2012

## 2012-04-22 ENCOUNTER — Telehealth: Payer: Self-pay | Admitting: *Deleted

## 2012-04-22 NOTE — Telephone Encounter (Signed)
left voice message informing the patient of the new date and time for the lab and injection appointments

## 2012-04-25 ENCOUNTER — Other Ambulatory Visit: Payer: Self-pay | Admitting: *Deleted

## 2012-04-25 ENCOUNTER — Telehealth: Payer: Self-pay | Admitting: Oncology

## 2012-04-25 NOTE — Telephone Encounter (Signed)
S/w the pt and he is aware of the md appt on 05/17/2012

## 2012-04-29 ENCOUNTER — Encounter: Payer: Self-pay | Admitting: Infectious Diseases

## 2012-05-02 ENCOUNTER — Other Ambulatory Visit: Payer: Self-pay | Admitting: Physician Assistant

## 2012-05-09 ENCOUNTER — Ambulatory Visit: Payer: Medicare Other

## 2012-05-09 ENCOUNTER — Other Ambulatory Visit: Payer: Medicare Other | Admitting: Lab

## 2012-05-10 ENCOUNTER — Ambulatory Visit: Payer: Medicare Other

## 2012-05-10 ENCOUNTER — Other Ambulatory Visit (HOSPITAL_BASED_OUTPATIENT_CLINIC_OR_DEPARTMENT_OTHER): Payer: Medicare Other

## 2012-05-10 ENCOUNTER — Telehealth: Payer: Self-pay | Admitting: *Deleted

## 2012-05-10 DIAGNOSIS — C9000 Multiple myeloma not having achieved remission: Secondary | ICD-10-CM

## 2012-05-10 DIAGNOSIS — D539 Nutritional anemia, unspecified: Secondary | ICD-10-CM

## 2012-05-10 LAB — CBC WITH DIFFERENTIAL/PLATELET
Eosinophils Absolute: 0 10*3/uL (ref 0.0–0.5)
MONO#: 0.5 10*3/uL (ref 0.1–0.9)
NEUT#: 6.5 10*3/uL (ref 1.5–6.5)
RBC: 3.53 10*6/uL — ABNORMAL LOW (ref 4.20–5.82)
RDW: 16.6 % — ABNORMAL HIGH (ref 11.0–14.6)
WBC: 8.3 10*3/uL (ref 4.0–10.3)

## 2012-05-10 LAB — PROTEIN / CREATININE RATIO, URINE: Protein Creatinine Ratio: 0.06 (ref <0.15)

## 2012-05-10 NOTE — Telephone Encounter (Signed)
Per the patient's wife they ae canceling the ijection today. They believe its to ealry. I have told her that today is 4weeks from last ijection. Per the wife, "let cancel it and do it next week with his MD appt. He had injcetions in back and not feeling feel. "  JMW

## 2012-05-13 LAB — COMPREHENSIVE METABOLIC PANEL
Albumin: 4 g/dL (ref 3.5–5.2)
CO2: 24 mEq/L (ref 19–32)
Chloride: 100 mEq/L (ref 96–112)
Glucose, Bld: 109 mg/dL — ABNORMAL HIGH (ref 70–99)
Potassium: 4 mEq/L (ref 3.5–5.3)
Sodium: 133 mEq/L — ABNORMAL LOW (ref 135–145)
Total Protein: 6.6 g/dL (ref 6.0–8.3)

## 2012-05-13 LAB — KAPPA/LAMBDA LIGHT CHAINS
Kappa free light chain: 9.78 mg/dL — ABNORMAL HIGH (ref 0.33–1.94)
Lambda Free Lght Chn: 1.59 mg/dL (ref 0.57–2.63)

## 2012-05-17 ENCOUNTER — Ambulatory Visit (HOSPITAL_BASED_OUTPATIENT_CLINIC_OR_DEPARTMENT_OTHER): Payer: Medicare Other | Admitting: Oncology

## 2012-05-17 ENCOUNTER — Telehealth: Payer: Self-pay | Admitting: *Deleted

## 2012-05-17 ENCOUNTER — Ambulatory Visit: Payer: Medicare Other | Admitting: Nurse Practitioner

## 2012-05-17 VITALS — BP 154/64 | HR 64 | Temp 97.6°F | Ht 70.0 in | Wt 167.5 lb

## 2012-05-17 DIAGNOSIS — C9000 Multiple myeloma not having achieved remission: Secondary | ICD-10-CM

## 2012-05-17 NOTE — Telephone Encounter (Signed)
Gave patient appointment for 08-05-2012

## 2012-05-17 NOTE — Progress Notes (Signed)
ID: [name]   DOB: 1928/10/31  MR#: [id number]  WUJ#:[id number]  HISTORY OF PRESENT ILLNESS: Patient presented in April 2007 with symptomatic slowly progressive anemia, with normal iron parameters, folic acid, and vitamin B12. Plans were made to obtain bone marrow aspirate and biopsy as an outpatient, however the patient was hospitalized shortly thereafter with a creatinine of 8.6. Renal biopsy demonstrated the presence of  light chain deposition disease. Bone marrow biopsy demonstrated 37% plasma cells. A skeletal survey showed small calvarial lytic lesions, mild osteopenia, and cervical spondylosis. With a well-established diagnosis of light-chain multiple myeloma [name] subsequent treatments are as detailed below.   INTERVAL HISTORY: Timothy [name] returns today with his wife, Timothy [name], for followup of his multiple myeloma . Since her last visit here he has completed his antibiotic and hyperbaric treatments, and the left foot wound has completely healed. He is back to doing her gardening, although not yet playing golf per he looks considerably better today than he did the last visit.  REVIEW OF SYSTEMS: He is using some inserts any issues which are helping him move. He continues to have neuropathy pain and he takes dilatated approximately 6 mg twice daily on the bad days, otherwise not at all or 4 mg once daily. This does constipate him and he takes MiraLAX irregularly. He assures me he is not having hard bowel movements. His back pain is improved throughout injection obtained at [facility] orth[facility]. Currently he is having no fevers, rash, bleeding, unusual headaches, visual changes, difficulty swallowing or pain on swallowing, cough, phlegm production, pleurisy, or problems with dysuria or hematuria. A detailed review of systems was otherwise stable   PAST MEDICAL HISTORY: Past Medical History  Diagnosis Date  . ASCVD (arteriosclerotic cardiovascular disease)   . Anemia     chronic mild anemia    . Gout   . Hypertension   . Lumbar disc disease     post laminectomy  . Chronic back pain   . History of echocardiogram 11/02/2010    EF range of 30 to 35% / There is hypokinsesis of the basal-mild inferolateral myocardium  . CHF (congestive heart failure)   . Heart murmur   . Ischemic cardiomyopathy   . Renal insufficiency   . Peripheral neuropathy   . Inferior myocardial infarction 1986  . Hyperlipidemia   . SOB (shortness of breath)   . Fatigue   . Peripheral neuropathy   . Coronary artery disease     CABG 2002 by [name] with LIMA to LAD, SVG to intermediate, SVG to LCX, & SVG to PL. Last nuclear in 2012 showing large inferior scar with EF of 39%.   . Blood transfusion     DEC 2012 - TWO UNITS  . 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  PT HAS BEEN OFF CHEMO SINCE HIS HOSPITALIZATION FEB 2013 FOR LEFT FOOT INFECTION  . Cancer   . History of shingles MARCH 2009    SHINGLE LESIONS WERE AROUND RIGHT EYE--PT HAS RESIDUAL ITCHING AROUND THE EYE.  . Osteomyelitis     s/p toe amputation  . Chronic systolic heart failure     EF of 30% per echo 02/2012  . Protein malnutrition    PAST SURGICAL HISTORY: Past Surgical History  Procedure Date  . Laminectomy   . Inguinal hernia repair   . Cardiac catheterization 06/20/2000    Severe coronary disease (totally occluded right artery, 90% left circumflex, 50-60% intermediate, and  90-95% ostial left anterior descending)   . Tonsillectomy   . Coronary artery bypass graft 2002    x4 / Left internal mammary artery to the LAD.  / Saphenous vein graft to the right posterolateral.  /  Saphenous vein graft to  the ramus intermedius. / Saphenous vein graft to the circumflex marginal     . Amputation 03/06/2012    Procedure: AMPUTATION RAY;  Surgeon: Harvie Junior, MD;  Location: [facility];  Service: Orthopedics;  Laterality: Left;  First Ray Amputation    FAMILY HISTORY No family history  of hematologic malignancies; brother had prostate cancer; no other cancers in the immediate family  SOCIAL HISTORY: Retired International aid/development worker; children from prior marriage   ADVANCED DIRECTIVES:  HEALTH MAINTENANCE: History  Substance Use Topics  . Smoking status: Former Smoker -- 1.0 packs/day for 20 years    Types: Cigarettes    Quit date: 11/20/1984  . Smokeless tobacco: Never Used  . Alcohol Use: No     Colonoscopy:  PAS:  Bone density:  Lipid panel:  Allergies  Allergen Reactions  . Morphine And Related Other (See Comments)    Could not seat still    Current Outpatient Prescriptions  Medication Sig Dispense Refill  . allopurinol (ZYLOPRIM) 300 MG tablet Take 150 mg by mouth daily.       Marland Kitchen aspirin 81 MG tablet Take 1 tablet (81 mg total) by mouth daily.  30 tablet  0  . atorvastatin (LIPITOR) 40 MG tablet Take 20 mg by mouth daily.       . B Complex-Biotin-FA (B COMPLETE PO) Take 0.5 tablets by mouth 2 (two) times daily.       . carvedilol (COREG) 3.125 MG tablet Take 1 tablet (3.125 mg total) by mouth 2 (two) times daily with a meal.  180 tablet  3  . citalopram (CELEXA) 10 MG tablet Take 2 tablets (20 mg total) by mouth at bedtime. Take one tablet daily  90 tablet  12  . Coenzyme Q10 (CO Q-10 PO) Take 1 tablet by mouth daily.       . furosemide (LASIX) 40 MG tablet Take 0.5 tablets (20 mg total) by mouth daily.  90 tablet  2  . lisinopril (PRINIVIL,ZESTRIL) 20 MG tablet Take 20 mg by mouth daily.      . multivitamin (THERAGRAN) per tablet Take 0.5 tablets by mouth 2 (two) times daily.       . Omega-3 Fatty Acids (FISH OIL PO) Take 1 capsule by mouth 2 (two) times daily.       . Tamsulosin HCl (FLOMAX) 0.4 MG CAPS Take 1 capsule (0.4 mg total) by mouth daily.  90 capsule  1  . valACYclovir (VALTREX) 500 MG tablet Take 500 mg by mouth daily. CONT.      Marland Kitchen vitamin [name] (ASCORBIC ACID) 500 MG tablet Take 500 mg by mouth daily.      . Zinc 50 MG TABS Take 50 mg by mouth daily.       Marland Kitchen HYDROmorphone (DILAUDID) 4 MG tablet Take 1 tablet (4 mg total) by mouth every 4 (four) hours as needed.  90 tablet  0  . mirtazapine (REMERON) 7.5 MG tablet Take 1 tablet (7.5 mg total) by mouth at bedtime.  9 tablet  0    OBJECTIVE: Timothy [name] in no acute distress Filed Vitals:   05/17/12 0924  BP: 154/64  Pulse: 64  Temp: 97.6 F (36.4 [name])     Body mass index  is 24.03 kg/(m^2).    ECOG FS: 1  Sclerae unicteric Oropharynx clear No peripheral adenopathy Lungs no rales or rhonchi Heart regular rate and rhythm Abd soft, nontender, positive bowel sounds  MSK no focal spinal tenderness, no peripheral edema; his left foot ulcer has closed over. The incision has healed nicely, with minimal remaining erythema, no significant tenderness, no swelling  Neuro: nonfocal  LAB RESULTS: SPEP continues to show no monoclonal protein,  Results for Timothy [name], Timothy [name] (MRN [id number]) as of 05/17/2012 14:34  Ref. Range 12/27/2011 09:41 03/06/2012 18:19 03/21/2012 13:55 04/04/2012 11:08 05/10/2012 11:33  CRP Latest Range: <0.60 mg/dL  4.09 (H) 8.11    Kappa free light chain Latest Range: 0.33-1.94 mg/dL 91.47 (H)   82.95 (H) 6.21 (H)  Kappa:Lambda Ratio Latest Range: 0.26-1.65  21.18 (H)   5.81 (H) 6.15 (H)  Lambda Free Lght Chn Latest Range: 0.57-2.63 mg/dL 3.08   6.57 (H) 8.46    Lab Results  Component Value Date   WBC 8.3 05/10/2012   NEUTROABS 6.5 05/10/2012   HGB 11.8* 05/10/2012   HCT 34.1* 05/10/2012   MCV 96.7 05/10/2012   PLT 172 05/10/2012      Chemistry      Component Value Date/Time   NA 133* 05/10/2012 1133   K 4.0 05/10/2012 1133   CL 100 05/10/2012 1133   CO2 24 05/10/2012 1133   BUN 25* 05/10/2012 1133   CREATININE 1.07 05/10/2012 1133      Component Value Date/Time   CALCIUM 9.6 05/10/2012 1133   ALKPHOS 60 05/10/2012 1133   AST 28 05/10/2012 1133   ALT 25 05/10/2012 1133   BILITOT 0.4 05/10/2012 1133       No results found for this basename: LABCA2    No components found  with this basename: [id number]    No results found for this basename: INR:1;PROTIME:1 in the last 168 hours  Urinalysis    Component Value Date/Time   COLORURINE YELLOW 03/10/2012 2006    STUDIES: No results found.  ASSESSMENT: 78 year-old with kappa light chain multiple myeloma  (1) presenting April 2007 with anemia and renal failure; renal Bx showing free kappa light chain deposition; bone marrow biopsy showing 37% plasma cells, with normal cytogenetics and FISH;bone survey showing skull lytic lesions; treated with  (2) thalidomide (200 mg/d) and dexamethasone (40 mg/d x4d Q28d) June through Nov 2007, with good response, but poor tolerance;  (3) thalidomide decreased to 100 mg/d, dexamethasone continued, bortezomib (IV) added, Dec 2007 to March 2008  (4) treatment interrupted by multiple complications (peripheral neuropathy, pulmonary embolism, diverticular abscess, CN V zoster, severe DDD, congestive heart failure, rising PSA); maintenance zolendronic acid through March 2012  (5) progression April 2012, treated initially with dexamethasone alone, poorly tolerated;  (6) bortezomib (sq) resumed July 2012; cyclophosphamide and dexamethasone added Sept 2012; zolendronic acid changed to Q 3months; all treatments held as of March 2013 due to the development of the Left foot ulcer described above (7) aranesp started December 2012   PLAN:  Light chains have continued to drop. His CRP is also improved. Some of the increases we had noted were obviously related to his infection. At this point with an excellent CBC and [name]-Met, I do not see definitive indication to resume treatment. We will continue to follow his labs closely. At this point he is not needing any aranesp. He knows to call for any problems that may develop before the next visit.  Timothy [name]    05/17/2012

## 2012-05-20 ENCOUNTER — Encounter: Payer: Self-pay | Admitting: Nurse Practitioner

## 2012-05-20 ENCOUNTER — Ambulatory Visit (INDEPENDENT_AMBULATORY_CARE_PROVIDER_SITE_OTHER): Payer: Medicare Other | Admitting: Nurse Practitioner

## 2012-05-20 VITALS — BP 130/68 | HR 64 | Ht 70.0 in | Wt 165.0 lb

## 2012-05-20 DIAGNOSIS — I255 Ischemic cardiomyopathy: Secondary | ICD-10-CM

## 2012-05-20 DIAGNOSIS — I2589 Other forms of chronic ischemic heart disease: Secondary | ICD-10-CM

## 2012-05-20 DIAGNOSIS — I2510 Atherosclerotic heart disease of native coronary artery without angina pectoris: Secondary | ICD-10-CM

## 2012-05-20 MED ORDER — FUROSEMIDE 40 MG PO TABS: 20.0000 mg | ORAL_TABLET | Freq: Every day | ORAL | Status: DC | PRN

## 2012-05-20 MED ORDER — CARVEDILOL 6.25 MG PO TABS: 6.2500 mg | ORAL_TABLET | Freq: Two times a day (BID) | ORAL | Status: DC

## 2012-05-20 MED ORDER — ASPIRIN 81 MG PO TABS: 81.0000 mg | ORAL_TABLET | ORAL | Status: DC

## 2012-05-20 MED ORDER — NITROGLYCERIN 0.4 MG SL SUBL: 0.4000 mg | SUBLINGUAL_TABLET | SUBLINGUAL | Status: DC | PRN

## 2012-05-20 NOTE — Patient Instructions (Addendum)
I have refilled your NTG to the [facility]  Change your lasix to just as needed (for swelling, weight gain, etc)  Increase the Coreg to 6.25 mg two times a day. This is to help with your heart's pumping function.  I sent this to [facility].  I think you are doing well.    I will see you in 2 months.  Call the [facility] office at [phone] if you have any questions, problems or concerns.

## 2012-05-20 NOTE — Assessment & Plan Note (Signed)
Patient has known ischemic CM with EF of 30%. He is on ACE and just very low dose Coreg. I have changed his Lasix to just prn. I have increased the Coreg 6.25 mg BID. We will see if he is able to tolerate. We have plenty of BP to work with. Heart rate is currently in the 60's he will let us know if he does not tolerate. We will see him back in 2 months. Labs are checked monthly at the [facility]. Patient is agreeable to this plan and will call if any problems develop in the interim.

## 2012-05-20 NOTE — Assessment & Plan Note (Signed)
He has no current chest pain. He wants to cut his aspirin back to just every other day due to significant bruising. We will continue with his other medicines.

## 2012-05-20 NOTE — Progress Notes (Signed)
Timothy [name] Date of Birth: 1928/08/29 Medical Record #[id number]  History of Present Illness: Timothy [name] is seen today for his one month check. He is seen for Dr. [place]il[place]. He has a complex medical history which is outlined in his history below. Has an ischemic CM with an EF of 30%. His main issues center around his multiple myeloma, and recent toe amputation for osteomyelitis. He has finished a long course of antibiotics with complete healing reported.  He comes in today. He is here alone today. He says he is doing well. Has seen oncology last week. No active treatment at this time for his myeloma. Seems to be getting stronger. No chest pain. Not short of breath. Appetite is improving. Back doing some putting and chipping for his golf game. Mostly limited by his neuropathy and back pain. No CHF symptoms. Feels good on his current regimen. No swelling, SOB, cough, PND or orthopnea. His biggest complaint is bruising. He wants to cut his aspirin back some.   Current Outpatient Prescriptions on File Prior to Visit  Medication Sig Dispense Refill  . allopurinol (ZYLOPRIM) 300 MG tablet Take 150 mg by mouth daily.       Marland Kitchen aspirin 81 MG tablet Take 1 tablet (81 mg total) by mouth daily.  30 tablet  0  . atorvastatin (LIPITOR) 40 MG tablet Take 20 mg by mouth daily.       . B Complex-Biotin-FA (B COMPLETE PO) Take 0.5 tablets by mouth 2 (two) times daily.       . carvedilol (COREG) 3.125 MG tablet Take 1 tablet (3.125 mg total) by mouth 2 (two) times daily with a meal.  180 tablet  3  . citalopram (CELEXA) 10 MG tablet Take 2 tablets (20 mg total) by mouth at bedtime. Take one tablet daily  90 tablet  12  . Coenzyme Q10 (CO Q-10 PO) Take 1 tablet by mouth daily.       . furosemide (LASIX) 40 MG tablet Take 0.5 tablets (20 mg total) by mouth daily.  90 tablet  2  . lisinopril (PRINIVIL,ZESTRIL) 20 MG tablet Take 20 mg by mouth daily.      . mirtazapine (REMERON) 7.5 MG tablet Take 1 tablet (7.5 mg total)  by mouth at bedtime.  9 tablet  0  . multivitamin (THERAGRAN) per tablet Take 0.5 tablets by mouth 2 (two) times daily.       . Omega-3 Fatty Acids (FISH OIL PO) Take 1 capsule by mouth 2 (two) times daily.       . Tamsulosin HCl (FLOMAX) 0.4 MG CAPS Take 1 capsule (0.4 mg total) by mouth daily.  90 capsule  1  . valACYclovir (VALTREX) 500 MG tablet Take 500 mg by mouth daily. CONT.      Marland Kitchen vitamin C (ASCORBIC ACID) 500 MG tablet Take 500 mg by mouth daily.      . Zinc 50 MG TABS Take 50 mg by mouth daily.      Marland Kitchen HYDROmorphone (DILAUDID) 4 MG tablet Take 1 tablet (4 mg total) by mouth every 4 (four) hours as needed.  90 tablet  0    Allergies  Allergen Reactions  . Morphine And Related Other (See Comments)    Could not seat still    Past Medical History  Diagnosis Date  . ASCVD (arteriosclerotic cardiovascular disease)   . Anemia     chronic mild anemia  . Gout   . Hypertension   . Lumbar disc disease  post laminectomy  . Chronic back pain   . History of echocardiogram 11/02/2010    EF range of 30 to 35% / There is hypokinsesis of the basal-mild inferolateral myocardium  . CHF (congestive heart failure)   . Heart murmur   . Ischemic cardiomyopathy   . Renal insufficiency   . Peripheral neuropathy   . Inferior myocardial infarction 1986  . Hyperlipidemia   . SOB (shortness of breath)   . Fatigue   . Peripheral neuropathy   . Coronary artery disease     CABG 2002 by [name] with LIMA to LAD, SVG to intermediate, SVG to LCX, & SVG to PL. Last nuclear in 2012 showing large inferior scar with EF of 39%.   . Blood transfusion     DEC 2012 - TWO UNITS  . 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  PT HAS BEEN OFF CHEMO SINCE HIS HOSPITALIZATION FEB 2013 FOR LEFT FOOT INFECTION  . Cancer   . History of shingles MARCH 2009    SHINGLE LESIONS WERE AROUND RIGHT EYE--PT HAS RESIDUAL ITCHING AROUND THE EYE.  . Osteomyelitis      s/p toe amputation  . Chronic systolic heart failure     EF of 30% per echo 02/2012  . Protein malnutrition     Past Surgical History  Procedure Date  . Laminectomy   . Inguinal hernia repair   . Cardiac catheterization 06/20/2000    Severe coronary disease (totally occluded right artery, 90% left circumflex, 50-60% intermediate, and 90-95% ostial left anterior descending)   . Tonsillectomy   . Coronary artery bypass graft 2002    x4 / Left internal mammary artery to the LAD.  / Saphenous vein graft to the right posterolateral.  /  Saphenous vein graft to  the ramus intermedius. / Saphenous vein graft to the circumflex marginal     . Amputation 03/06/2012    Procedure: AMPUTATION RAY;  Surgeon: Harvie Junior, MD;  Location: [facility];  Service: Orthopedics;  Laterality: Left;  First Ray Amputation    History  Smoking status  . Former Smoker -- 1.0 packs/day for 20 years  . Types: Cigarettes  . Quit date: 11/20/1984  Smokeless tobacco  . Never Used    History  Alcohol Use No    History reviewed. No pertinent family history.  Review of Systems: The review of systems is per the HPI.  All other systems were reviewed and are negative.  Physical Exam: BP 130/68  Pulse 64  Ht 5\' 10"  (1.778 m)  Wt 165 lb (74.844 kg)  BMI 23.68 kg/m2 Patient is very pleasant and in no acute distress. Looks stronger today. Skin is warm and dry. Color is normal.  He is quite ecchymotic. HEENT is unremarkable. Normocephalic/atraumatic. PERRL. Sclera are nonicteric. Neck is supple. No masses. No JVD. Lungs are clear. Cardiac exam shows a regular rate and rhythm. Abdomen is soft. Extremities are without edema. Gait and ROM are intact. No gross neurologic deficits noted.  LABORATORY DATA:  Lab Results  Component Value Date   WBC 8.3 05/10/2012   HGB 11.8* 05/10/2012   HCT 34.1* 05/10/2012   PLT 172 05/10/2012   GLUCOSE 109* 05/10/2012   ALT 25 05/10/2012   AST 28 05/10/2012   NA 133* 05/10/2012   K 4.0  05/10/2012   CL 100 05/10/2012   CREATININE 1.07 05/10/2012   BUN 25* 05/10/2012   CO2 24 05/10/2012  TSH 1.84 03/20/2012   PSA 3.75 05/30/2006   INR 1.10 03/01/2012      Assessment / Plan:

## 2012-05-30 ENCOUNTER — Ambulatory Visit: Payer: Medicare Other

## 2012-05-30 ENCOUNTER — Other Ambulatory Visit: Payer: Medicare Other | Admitting: Lab

## 2012-06-06 ENCOUNTER — Other Ambulatory Visit (HOSPITAL_BASED_OUTPATIENT_CLINIC_OR_DEPARTMENT_OTHER): Payer: Medicare Other

## 2012-06-06 ENCOUNTER — Ambulatory Visit: Payer: Medicare Other

## 2012-06-06 DIAGNOSIS — D649 Anemia, unspecified: Secondary | ICD-10-CM

## 2012-06-06 DIAGNOSIS — N289 Disorder of kidney and ureter, unspecified: Secondary | ICD-10-CM

## 2012-06-06 DIAGNOSIS — C9000 Multiple myeloma not having achieved remission: Secondary | ICD-10-CM

## 2012-06-06 LAB — CBC WITH DIFFERENTIAL/PLATELET
Basophils Absolute: 0 10*3/uL (ref 0.0–0.1)
Eosinophils Absolute: 0.1 10*3/uL (ref 0.0–0.5)
HCT: 32.1 % — ABNORMAL LOW (ref 38.4–49.9)
HGB: 11 g/dL — ABNORMAL LOW (ref 13.0–17.1)
MCH: 33.3 pg (ref 27.2–33.4)
MONO#: 0.5 10*3/uL (ref 0.1–0.9)
NEUT#: 3 10*3/uL (ref 1.5–6.5)
NEUT%: 58.3 % (ref 39.0–75.0)
RDW: 17.6 % — ABNORMAL HIGH (ref 11.0–14.6)
lymph#: 1.5 10*3/uL (ref 0.9–3.3)

## 2012-06-06 LAB — PROTEIN / CREATININE RATIO, URINE: Creatinine, Urine: 85.6 mg/dL

## 2012-06-06 MED ORDER — DARBEPOETIN ALFA-POLYSORBATE 500 MCG/ML IJ SOLN: 300.0000 ug | Freq: Once | INTRAMUSCULAR | Status: DC

## 2012-06-07 LAB — COMPREHENSIVE METABOLIC PANEL
AST: 33 U/L (ref 0–37)
Albumin: 4 g/dL (ref 3.5–5.2)
BUN: 29 mg/dL — ABNORMAL HIGH (ref 6–23)
CO2: 25 mEq/L (ref 19–32)
Calcium: 9.1 mg/dL (ref 8.4–10.5)
Chloride: 104 mEq/L (ref 96–112)
Creatinine, Ser: 1.17 mg/dL (ref 0.50–1.35)
Glucose, Bld: 119 mg/dL — ABNORMAL HIGH (ref 70–99)
Potassium: 5.5 mEq/L — ABNORMAL HIGH (ref 3.5–5.3)

## 2012-06-07 LAB — KAPPA/LAMBDA LIGHT CHAINS
Kappa free light chain: 18.9 mg/dL — ABNORMAL HIGH (ref 0.33–1.94)
Kappa:Lambda Ratio: 6.43 — ABNORMAL HIGH (ref 0.26–1.65)
Lambda Free Lght Chn: 2.94 mg/dL — ABNORMAL HIGH (ref 0.57–2.63)

## 2012-06-14 ENCOUNTER — Other Ambulatory Visit: Payer: Self-pay

## 2012-06-24 ENCOUNTER — Other Ambulatory Visit: Payer: Self-pay | Admitting: Nurse Practitioner

## 2012-07-04 ENCOUNTER — Ambulatory Visit (HOSPITAL_BASED_OUTPATIENT_CLINIC_OR_DEPARTMENT_OTHER): Payer: Medicare Other

## 2012-07-04 ENCOUNTER — Other Ambulatory Visit (HOSPITAL_BASED_OUTPATIENT_CLINIC_OR_DEPARTMENT_OTHER): Payer: Medicare Other

## 2012-07-04 VITALS — BP 120/58 | HR 60 | Temp 97.0°F

## 2012-07-04 DIAGNOSIS — C9000 Multiple myeloma not having achieved remission: Secondary | ICD-10-CM

## 2012-07-04 DIAGNOSIS — N289 Disorder of kidney and ureter, unspecified: Secondary | ICD-10-CM

## 2012-07-04 DIAGNOSIS — D649 Anemia, unspecified: Secondary | ICD-10-CM

## 2012-07-04 LAB — CBC WITH DIFFERENTIAL/PLATELET
Basophils Absolute: 0 10*3/uL (ref 0.0–0.1)
EOS%: 0.8 % (ref 0.0–7.0)
Eosinophils Absolute: 0 10*3/uL (ref 0.0–0.5)
HCT: 29.4 % — ABNORMAL LOW (ref 38.4–49.9)
HGB: 9.8 g/dL — ABNORMAL LOW (ref 13.0–17.1)
MCH: 33.4 pg (ref 27.2–33.4)
NEUT#: 3.1 10*3/uL (ref 1.5–6.5)
NEUT%: 57.6 % (ref 39.0–75.0)
lymph#: 1.7 10*3/uL (ref 0.9–3.3)

## 2012-07-04 LAB — PROTEIN / CREATININE RATIO, URINE: Creatinine, Urine: 156.1 mg/dL

## 2012-07-04 MED ORDER — DARBEPOETIN ALFA-POLYSORBATE 300 MCG/0.6ML IJ SOLN
300.0000 ug | Freq: Once | INTRAMUSCULAR | Status: AC
  Administered 2012-07-04: 300 ug via SUBCUTANEOUS
  Filled 2012-07-04: qty 0.6

## 2012-07-05 LAB — KAPPA/LAMBDA LIGHT CHAINS
Kappa free light chain: 14.7 mg/dL — ABNORMAL HIGH (ref 0.33–1.94)
Kappa:Lambda Ratio: 5.31 — ABNORMAL HIGH (ref 0.26–1.65)
Lambda Free Lght Chn: 2.77 mg/dL — ABNORMAL HIGH (ref 0.57–2.63)

## 2012-07-05 LAB — COMPREHENSIVE METABOLIC PANEL
AST: 25 U/L (ref 0–37)
Albumin: 3.9 g/dL (ref 3.5–5.2)
BUN: 33 mg/dL — ABNORMAL HIGH (ref 6–23)
CO2: 23 mEq/L (ref 19–32)
Calcium: 8.6 mg/dL (ref 8.4–10.5)
Chloride: 105 mEq/L (ref 96–112)
Creatinine, Ser: 1.14 mg/dL (ref 0.50–1.35)
Glucose, Bld: 97 mg/dL (ref 70–99)
Potassium: 4.7 mEq/L (ref 3.5–5.3)

## 2012-07-11 ENCOUNTER — Other Ambulatory Visit: Payer: Self-pay | Admitting: Neurological Surgery

## 2012-07-11 DIAGNOSIS — M48061 Spinal stenosis, lumbar region without neurogenic claudication: Secondary | ICD-10-CM

## 2012-07-12 ENCOUNTER — Other Ambulatory Visit: Payer: Self-pay | Admitting: Neurological Surgery

## 2012-07-12 DIAGNOSIS — M48061 Spinal stenosis, lumbar region without neurogenic claudication: Secondary | ICD-10-CM

## 2012-07-17 ENCOUNTER — Ambulatory Visit
Admission: RE | Admit: 2012-07-17 | Discharge: 2012-07-17 | Disposition: A | Discharge status: Home / Self Care | Payer: Medicare Other | Source: Ambulatory Visit | Attending: Neurological Surgery | Admitting: Neurological Surgery

## 2012-07-17 DIAGNOSIS — M48061 Spinal stenosis, lumbar region without neurogenic claudication: Secondary | ICD-10-CM

## 2012-07-18 ENCOUNTER — Other Ambulatory Visit: Payer: Medicare Other

## 2012-07-23 ENCOUNTER — Ambulatory Visit: Payer: Medicare Other | Admitting: Nurse Practitioner

## 2012-07-24 ENCOUNTER — Other Ambulatory Visit: Payer: Self-pay | Admitting: *Deleted

## 2012-07-24 ENCOUNTER — Ambulatory Visit: Payer: Medicare Other | Admitting: Nurse Practitioner

## 2012-07-24 MED ORDER — DIPHENOXYLATE-ATROPINE 2.5-0.025 MG PO TABS: 1.0000 | ORAL_TABLET | Freq: Four times a day (QID) | ORAL | Status: AC | PRN

## 2012-07-26 ENCOUNTER — Ambulatory Visit
Admission: RE | Admit: 2012-07-26 | Discharge: 2012-07-26 | Disposition: A | Discharge status: Home / Self Care | Payer: Medicare Other | Source: Ambulatory Visit | Attending: Neurological Surgery | Admitting: Neurological Surgery

## 2012-07-26 ENCOUNTER — Other Ambulatory Visit: Payer: Self-pay

## 2012-07-26 DIAGNOSIS — M48061 Spinal stenosis, lumbar region without neurogenic claudication: Secondary | ICD-10-CM

## 2012-07-29 ENCOUNTER — Ambulatory Visit (INDEPENDENT_AMBULATORY_CARE_PROVIDER_SITE_OTHER): Payer: Medicare Other | Admitting: Nurse Practitioner

## 2012-07-29 ENCOUNTER — Ambulatory Visit: Payer: Medicare Other | Admitting: Nurse Practitioner

## 2012-07-29 ENCOUNTER — Encounter: Payer: Self-pay | Admitting: Nurse Practitioner

## 2012-07-29 VITALS — BP 140/60 | HR 57 | Ht 70.0 in | Wt 174.0 lb

## 2012-07-29 DIAGNOSIS — I255 Ischemic cardiomyopathy: Secondary | ICD-10-CM

## 2012-07-29 DIAGNOSIS — I2589 Other forms of chronic ischemic heart disease: Secondary | ICD-10-CM

## 2012-07-29 NOTE — Progress Notes (Signed)
Timothy [name] Date of Birth: Dec 07, 1927 Medical Record #[id number]  History of Present Illness: Timothy [name] is seen today for his one month check. He is seen for Dr. Swaziland. He has a complex medical history which includes an ischemic CM with an EF of 30%. He has multiple myeloma and recent toe amputations for osteomyelitis. He is followed closely by oncology. Other problems include HLD, depression, remote CABG in 2002, CKD, neuropathy, anemia and gout.   He comes in today. He is here alone.  He seems to be doing ok from our standpoint. He continues to be limited by his back pain and chronic neuropathy. He has started some B1 to see if it will help. He has also had an MRI and CT of his spine. There is talk of a possible lumbar fusion. He is a little hesitant to proceed down that course of treatment. No chest pain. Appetite has picked up. He is gaining weight. Not swelling. BP at home has been good. He has not had any chemo since February and sees oncology next week. He has monthly labs at the Cancer center.   Current Outpatient Prescriptions on File Prior to Visit  Medication Sig Dispense Refill  . aspirin 81 MG tablet Take 1 tablet (81 mg total) by mouth every other day.  30 tablet  0  . atorvastatin (LIPITOR) 40 MG tablet Take 20 mg by mouth daily.       . B Complex-Biotin-FA (B COMPLETE PO) Take 0.5 tablets by mouth 2 (two) times daily.       . carvedilol (COREG) 6.25 MG tablet Take 1 tablet (6.25 mg total) by mouth 2 (two) times daily.  180 tablet  3  . citalopram (CELEXA) 10 MG tablet Take 2 tablets (20 mg total) by mouth at bedtime. Take one tablet daily  90 tablet  12  . Coenzyme Q10 (CO Q-10 PO) Take 1 tablet by mouth daily.       . diphenoxylate-atropine (LOMOTIL) 2.5-0.025 MG per tablet Take 1 tablet by mouth 4 (four) times daily as needed for diarrhea or loose stools.  120 tablet  1  . lisinopril (PRINIVIL,ZESTRIL) 20 MG tablet Take 20 mg by mouth daily.      . multivitamin (THERAGRAN) per  tablet Take 0.5 tablets by mouth 2 (two) times daily.       . nitroGLYCERIN (NITROSTAT) 0.4 MG SL tablet Place 1 tablet (0.4 mg total) under the tongue every 5 (five) minutes as needed for chest pain.  25 tablet  3  . Tamsulosin HCl (FLOMAX) 0.4 MG CAPS Take 1 capsule (0.4 mg total) by mouth daily.  90 capsule  1  . DISCONTD: allopurinol (ZYLOPRIM) 300 MG tablet TAKE 1 TABLET DAILY  90 tablet  2  . HYDROmorphone (DILAUDID) 4 MG tablet Take 1 tablet (4 mg total) by mouth every 4 (four) hours as needed.  90 tablet  0  . DISCONTD: allopurinol (ZYLOPRIM) 300 MG tablet Take 150 mg by mouth daily.       Marland Kitchen DISCONTD: mirtazapine (REMERON) 7.5 MG tablet Take 1 tablet (7.5 mg total) by mouth at bedtime.  9 tablet  0    Allergies  Allergen Reactions  . Morphine And Related Other (See Comments)    Could not seat still    Past Medical History  Diagnosis Date  . ASCVD (arteriosclerotic cardiovascular disease)   . Anemia     chronic mild anemia  . Gout   . Hypertension   . Lumbar disc  disease     post laminectomy  . Chronic back pain   . History of echocardiogram 11/02/2010    EF range of 30 to 35% / There is hypokinsesis of the basal-mild inferolateral myocardium  . CHF (congestive heart failure)   . Heart murmur   . Ischemic cardiomyopathy   . Renal insufficiency   . Peripheral neuropathy   . Inferior myocardial infarction 1986  . Hyperlipidemia   . SOB (shortness of breath)   . Fatigue   . Peripheral neuropathy   . Coronary artery disease     CABG 2002 by [name] with LIMA to LAD, SVG to intermediate, SVG to LCX, & SVG to PL. Last nuclear in 2012 showing large inferior scar with EF of 39%.   . Blood transfusion     DEC 2012 - TWO UNITS  . 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  PT HAS BEEN OFF CHEMO SINCE HIS HOSPITALIZATION FEB 2013 FOR LEFT FOOT INFECTION  . Cancer   . History of shingles MARCH 2009    SHINGLE LESIONS WERE  AROUND RIGHT EYE--PT HAS RESIDUAL ITCHING AROUND THE EYE.  . Osteomyelitis     s/p toe amputation  . Chronic systolic heart failure     EF of 30% per echo 02/2012  . Protein malnutrition     Past Surgical History  Procedure Date  . Laminectomy   . Inguinal hernia repair   . Cardiac catheterization 06/20/2000    Severe coronary disease (totally occluded right artery, 90% left circumflex, 50-60% intermediate, and 90-95% ostial left anterior descending)   . Tonsillectomy   . Coronary artery bypass graft 2002    x4 / Left internal mammary artery to the LAD.  / Saphenous vein graft to the right posterolateral.  /  Saphenous vein graft to  the ramus intermedius. / Saphenous vein graft to the circumflex marginal     . Amputation 03/06/2012    Procedure: AMPUTATION RAY;  Surgeon: Harvie Junior, MD;  Location: [facility];  Service: Orthopedics;  Laterality: Left;  First Ray Amputation    History  Smoking status  . Former Smoker -- 1.0 packs/day for 20 years  . Types: Cigarettes  . Quit date: 11/20/1984  Smokeless tobacco  . Never Used    History  Alcohol Use No    History reviewed. No pertinent family history.  Review of Systems: The review of systems is per the HPI.  All other systems were reviewed and are negative.  Physical Exam: BP 147/57  Pulse 57  Ht 5\' 10"  (1.778 m)  Wt 174 lb (78.926 kg)  BMI 24.97 kg/m2 Patient is very pleasant and in no acute distress. He looks good. Seems stronger. Skin is warm and dry. Color is normal.  HEENT is unremarkable. Normocephalic/atraumatic. PERRL. Sclera are nonicteric. Neck is supple. No masses. No JVD. Lungs are clear. Cardiac exam shows a regular rate and rhythm. Abdomen is soft. Extremities are without edema. Gait and ROM are intact. No gross neurologic deficits noted.   LABORATORY DATA:  Lab Results  Component Value Date   WBC 5.4 07/04/2012   HGB 9.8* 07/04/2012   HCT 29.4* 07/04/2012   PLT 155 07/04/2012   GLUCOSE 97 07/04/2012   ALT  22 07/04/2012   AST 25 07/04/2012   NA 134* 07/04/2012   K 4.7 07/04/2012   CL 105 07/04/2012   CREATININE 1.14 07/04/2012   BUN 33* 07/04/2012  CO2 23 07/04/2012   TSH 1.84 03/20/2012   PSA 3.75 05/30/2006   INR 1.10 03/01/2012    Assessment / Plan:  1. Ischemic CM - His EF is 30%. He seems to be doing well. Looks well compensated. No change in his current medicines. BP is better at home. I have asked him to continue to monitor.  2. Back pain - has been undergoing evaluation. May be considering possible lumbar fusion after the first of the year.   3. Multiple myeloma - plan per oncology  We will see him back in about 4 months. No change in his current regimen. Labs are checked by Oncology. Patient is agreeable to this plan and will call if any problems develop in the interim.

## 2012-07-29 NOTE — Patient Instructions (Addendum)
I will have Dr. Swaziland see you in 4 months  Stay on your current medicines  Watch your salt  Check some blood pressures for me. Goal is to stay less than 135/85  Call the [facility] office at [phone] if you have any questions, problems or concerns.

## 2012-08-01 ENCOUNTER — Other Ambulatory Visit (HOSPITAL_BASED_OUTPATIENT_CLINIC_OR_DEPARTMENT_OTHER): Payer: Medicare Other

## 2012-08-01 ENCOUNTER — Ambulatory Visit: Payer: Medicare Other

## 2012-08-01 DIAGNOSIS — C9000 Multiple myeloma not having achieved remission: Secondary | ICD-10-CM

## 2012-08-01 DIAGNOSIS — N289 Disorder of kidney and ureter, unspecified: Secondary | ICD-10-CM

## 2012-08-01 DIAGNOSIS — D649 Anemia, unspecified: Secondary | ICD-10-CM

## 2012-08-01 LAB — COMPREHENSIVE METABOLIC PANEL (CC13)
BUN: 30 mg/dL — ABNORMAL HIGH (ref 7.0–26.0)
CO2: 21 mEq/L — ABNORMAL LOW (ref 22–29)
Calcium: 9.2 mg/dL (ref 8.4–10.4)
Chloride: 109 mEq/L — ABNORMAL HIGH (ref 98–107)
Creatinine: 1.2 mg/dL (ref 0.7–1.3)
Glucose: 92 mg/dl (ref 70–99)
Total Bilirubin: 0.6 mg/dL (ref 0.20–1.20)

## 2012-08-01 LAB — CBC WITH DIFFERENTIAL/PLATELET
Basophils Absolute: 0 10*3/uL (ref 0.0–0.1)
EOS%: 2.6 % (ref 0.0–7.0)
HGB: 10.3 g/dL — ABNORMAL LOW (ref 13.0–17.1)
MCH: 32.1 pg (ref 27.2–33.4)
MCV: 98.1 fL — ABNORMAL HIGH (ref 79.3–98.0)
MONO%: 12.6 % (ref 0.0–14.0)
NEUT#: 2.3 10*3/uL (ref 1.5–6.5)
RBC: 3.21 10*6/uL — ABNORMAL LOW (ref 4.20–5.82)
RDW: 15 % — ABNORMAL HIGH (ref 11.0–14.6)
lymph#: 1.3 10*3/uL (ref 0.9–3.3)

## 2012-08-01 MED ORDER — DARBEPOETIN ALFA-POLYSORBATE 500 MCG/ML IJ SOLN: 300.0000 ug | Freq: Once | INTRAMUSCULAR | Status: DC

## 2012-08-02 ENCOUNTER — Ambulatory Visit: Payer: Medicare Other | Admitting: Nurse Practitioner

## 2012-08-02 LAB — KAPPA/LAMBDA LIGHT CHAINS
Kappa free light chain: 11.8 mg/dL — ABNORMAL HIGH (ref 0.33–1.94)
Kappa:Lambda Ratio: 4.47 — ABNORMAL HIGH (ref 0.26–1.65)
Lambda Free Lght Chn: 2.64 mg/dL — ABNORMAL HIGH (ref 0.57–2.63)

## 2012-08-05 ENCOUNTER — Ambulatory Visit (HOSPITAL_BASED_OUTPATIENT_CLINIC_OR_DEPARTMENT_OTHER): Payer: Medicare Other | Admitting: Physician Assistant

## 2012-08-05 ENCOUNTER — Encounter: Payer: Self-pay | Admitting: Physician Assistant

## 2012-08-05 VITALS — BP 122/60 | HR 55 | Temp 97.9°F | Resp 20 | Ht 70.0 in | Wt 177.4 lb

## 2012-08-05 DIAGNOSIS — C9000 Multiple myeloma not having achieved remission: Secondary | ICD-10-CM

## 2012-08-05 DIAGNOSIS — D649 Anemia, unspecified: Secondary | ICD-10-CM

## 2012-08-05 DIAGNOSIS — M545 Low back pain: Secondary | ICD-10-CM

## 2012-08-05 NOTE — Progress Notes (Signed)
ID: Timothy [name]   DOB: 1928-07-20  MR#: [id number]  HQI#:[id number]  HISTORY OF PRESENT ILLNESS: Patient presented in April 2007 with symptomatic slowly progressive anemia, with normal iron parameters, folic acid, and vitamin B12. Plans were made to obtain bone marrow aspirate and biopsy as an outpatient, however the patient was hospitalized shortly thereafter with a creatinine of 8.6. Renal biopsy demonstrated the presence of  light chain deposition disease. Bone marrow biopsy demonstrated 37% plasma cells. A skeletal survey showed small calvarial lytic lesions, mild osteopenia, and cervical spondylosis. With a well-established diagnosis of light-chain multiple myeloma Timothy [name] subsequent treatments are as detailed below.   INTERVAL HISTORY: Timothy [name] returns today with his wife, Timothy [name], for followup of his multiple myeloma .  Interval history is generally unremarkable and he is feeling well. His primary complaint continues to be lower back pain for which he is being seen by Timothy [name]. There is some discussion on whether or not to proceed with a lumbar fusion, although this decision has not yet been made. In the meanwhile, Timothy [name] is keeping his pain controlled with hydromorphone, and this helps the back pain as well as the chronic neuropathic pain. He is able to do small amount of walking, plays golf occasionally, and does his gardening at home.   REVIEW OF SYSTEMS: Timothy [name] denies any fevers, chills, or drenching night sweats. He's had no mouth ulcers or problems swallowing and denies nausea or change in bowel habits. No change in urinary habits, and specifically no dysuria or hematuria. He's had no rashes or abnormal bleeding. No cough, phlegm production, or increased shortness of breath. He denies chest pain. He continues to be followed at [facility] cardiology. He has had no abnormal headaches or dizziness. No peripheral swelling.  A detailed review of systems is otherwise stable and  noncontributory.   PAST MEDICAL HISTORY: Past Medical History  Diagnosis Date  . ASCVD (arteriosclerotic cardiovascular disease)   . Anemia     chronic mild anemia  . Gout   . Hypertension   . Lumbar disc disease     post laminectomy  . Chronic back pain   . History of echocardiogram 11/02/2010    EF range of 30 to 35% / There is hypokinsesis of the basal-mild inferolateral myocardium  . CHF (congestive heart failure)   . Heart murmur   . Ischemic cardiomyopathy   . Renal insufficiency   . Peripheral neuropathy   . Inferior myocardial infarction 1986  . Hyperlipidemia   . SOB (shortness of breath)   . Fatigue   . Peripheral neuropathy   . Coronary artery disease     CABG 2002 by Timothy [name] with LIMA to LAD, SVG to intermediate, SVG to LCX, & SVG to PL. Last nuclear in 2012 showing large inferior scar with EF of 39%.   . Blood transfusion     DEC 2012 - TWO UNITS  . Myeloma     Multiple myeloma, with recurrence of increasing problems  Timothy [name] -ONCOLOGIST.  PT HAS BEEN OFF CHEMO SINCE HIS HOSPITALIZATION FEB 2013 FOR LEFT FOOT INFECTION  . Cancer   . History of shingles MARCH 2009    SHINGLE LESIONS WERE AROUND RIGHT EYE--PT HAS RESIDUAL ITCHING AROUND THE EYE.  . Osteomyelitis     s/p toe amputation  . Chronic systolic heart failure     EF of 30% per echo 02/2012  . Protein malnutrition    PAST SURGICAL HISTORY: Past Surgical History  Procedure  Date  . Laminectomy   . Inguinal hernia repair   . Cardiac catheterization 06/20/2000    Severe coronary disease (totally occluded right artery, 90% left circumflex, 50-60% intermediate, and 90-95% ostial left anterior descending)   . Tonsillectomy   . Coronary artery bypass graft 2002    x4 / Left internal mammary artery to the LAD.  / Saphenous vein graft to the right posterolateral.  /  Saphenous vein graft to  the ramus intermedius. / Saphenous vein graft to the circumflex marginal     . 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 Smoking status: Former Smoker -- 1.0 packs/day for 20 years    Types: Cigarettes    Quit date: 11/20/1984  . Smokeless tobacco: Never Used  . Alcohol Use: No     Colonoscopy:  PAS:  Bone density:  Lipid panel:  Allergies  Allergen Reactions  . Morphine And Related Other (See Comments)    Could not seat still    Current Outpatient Prescriptions  Medication Sig Dispense Refill  . allopurinol (ZYLOPRIM) 300 MG tablet       . aspirin 81 MG tablet Take 1 tablet (81 mg total) by mouth every other day.  30 tablet  0  . atorvastatin (LIPITOR) 40 MG tablet Take 20 mg by mouth daily.       . B Complex-Biotin-FA (B COMPLETE PO) Take 0.5 tablets by mouth 2 (two) times daily.       . carvedilol (COREG) 6.25 MG tablet Take 1 tablet (6.25 mg total) by mouth 2 (two) times daily.  180 tablet  3  . citalopram (CELEXA) 10 MG tablet Take 2 tablets (20 mg total) by mouth at bedtime. Take one tablet daily  90 tablet  12  . Coenzyme Q10 (CO Q-10 PO) Take 1 tablet by mouth daily.       Marland Kitchen HYDROmorphone (DILAUDID) 4 MG tablet Take 4 mg by mouth every 4 (four) hours as needed.      Marland Kitchen lisinopril (PRINIVIL,ZESTRIL) 20 MG tablet Take 20 mg by mouth daily.      . mirtazapine (REMERON) 7.5 MG tablet Take 7.5 mg by mouth at bedtime.      . multivitamin (THERAGRAN) per tablet Take 0.5 tablets by mouth 2 (two) times daily.       . Tamsulosin HCl (FLOMAX) 0.4 MG CAPS Take 1 capsule (0.4 mg total) by mouth daily.  90 capsule  1  . thiamine (VITAMIN B-1) 100 MG tablet Take 100 mg by mouth daily.       . valACYclovir (VALTREX) 500 MG tablet Take 500 mg by mouth 2 (two) times daily. (shingles)      . HYDROmorphone (DILAUDID) 4 MG tablet Take 1 tablet (4 mg total) by mouth every 4 (four) hours as needed.  90 tablet  0  . nitroGLYCERIN (NITROSTAT) 0.4 MG SL tablet Place 1 tablet (0.4 mg total) under the tongue every 5 (five) minutes as needed for chest pain.  25 tablet  3    OBJECTIVE: Elderly white male in no acute distress Filed Vitals:   08/05/12 1115  BP:  122/60  Pulse: 55  Temp: 97.9 F (36.6 C)  Resp: 20     Body mass index is 25.45 kg/(m^2).    ECOG FS: 1  Sclerae unicteric Oropharynx clear No peripheral adenopathy Lungs no rales or rhonchi Heart regular rate and rhythm Abd soft, nontender, positive bowel sounds  MSK no focal spinal tenderness, no peripheral edema; Neuro: nonfocal, alert and oriented x 3  LAB RESULTS: SPEP continues to show no monoclonal protein,  Results for Timothy [name], Timothy [name] (MRN [id number]) as of 05/17/2012 14:34  Ref. Range 12/27/2011 09:41 03/06/2012 18:19 03/21/2012 13:55 04/04/2012 11:08 05/10/2012 11:33  CRP Latest Range: <0.60 mg/dL  1.47 (H) 8.29    Kappa free light chain Latest Range: 0.33-1.94 mg/dL 56.21 (H)   30.86 (H) 5.78 (H)  Kappa:Lambda Ratio Latest Range: 0.26-1.65  21.18 (H)   5.81 (H) 6.15 (H)  Lambda Free Lght Chn Latest Range: 0.57-2.63 mg/dL 4.69   6.29 (H) 5.28   08/01/12 Kappa Free Light Chain: 11.8 (Down from 14.7 in August) Lambda Free Light Chain: 2.64 (Down from 2.77 in August) Kappa:Lambda Ratio: 4.47  Protein Creatinine Ratio, Urine:  Stable at 0.08   Lab Results  Component Value Date   WBC 4.3 08/01/2012   NEUTROABS 2.3 08/01/2012   HGB 10.3* 08/01/2012   HCT 31.5* 08/01/2012   MCV 98.1* 08/01/2012   PLT 133* 08/01/2012      Chemistry      Component Value Date/Time   NA 139 08/01/2012 1049   NA 134* 07/04/2012 1106   K 4.6 08/01/2012 1049   K 4.7 07/04/2012 1106   CL 109* 08/01/2012 1049   CL 105 07/04/2012 1106   CO2 21*  08/01/2012 1049   CO2 23 07/04/2012 1106   BUN 30.0* 08/01/2012 1049   BUN 33* 07/04/2012 1106   CREATININE 1.2 08/01/2012 1049   CREATININE 1.14 07/04/2012 1106      Component Value Date/Time   CALCIUM 9.2 08/01/2012 1049   CALCIUM 8.6 07/04/2012 1106   ALKPHOS 58 08/01/2012 1049   ALKPHOS 52 07/04/2012 1106   AST 32 08/01/2012 1049   AST 25 07/04/2012 1106   ALT 27 08/01/2012 1049   ALT 22 07/04/2012 1106   BILITOT 0.60 08/01/2012 1049   BILITOT 0.6 07/04/2012 1106      STUDIES: No results found.  ASSESSMENT: 78 year-old with kappa light chain multiple myeloma  (1) presenting April 2007 with anemia and renal failure; renal Bx showing free kappa light chain deposition; bone marrow biopsy showing 37% plasma cells, with normal cytogenetics and FISH;bone survey showing skull lytic lesions; treated with  (2) thalidomide (200 mg/d) and dexamethasone (40 mg/d x4d Q28d) June through Nov 2007, with good response, but poor tolerance;  (3) thalidomide decreased to 100 mg/d, dexamethasone continued, bortezomib (IV) added, Dec 2007 to March 2008  (4) treatment interrupted by multiple complications (peripheral neuropathy, pulmonary embolism, diverticular abscess, CN V zoster, severe DDD, congestive heart failure, rising PSA); maintenance zolendronic acid through March 2012  (5) progression April 2012, treated initially with dexamethasone alone, poorly tolerated;  (6) bortezomib (sq) resumed July 2012; cyclophosphamide and dexamethasone added Sept 2012; zolendronic acid changed to Q 3months; all treatments held as of March 2013 due to the development of the Left foot ulcer described above (7) aranesp started December 2012   PLAN:  Timothy [name] light chains have continued to drop, and clinically he is feeling quite well.  I have refilled both the Dilaudid and the Valtrex today.  Dr.  Magrinat has reviewed this case, and we will plan on seeing Timothy [name] back in approximately 2 months. We will follow his labs  closely on a monthly basis. At this point he does not need Aranesp, and there is no clear indication to resume treatment. He and his wife both voice understanding and agreement with this plan, and know to call with any changes or problems.  Ranny Wiebelhaus    08/05/2012

## 2012-08-07 ENCOUNTER — Telehealth: Payer: Self-pay | Admitting: *Deleted

## 2012-08-07 NOTE — Telephone Encounter (Signed)
Per orders from 08-05-2012 patient is to have 08-29-2012 lab only 09-26-2012 lab only 10-03-2013 at 11:15am md

## 2012-08-27 ENCOUNTER — Telehealth: Payer: Self-pay | Admitting: Oncology

## 2012-08-27 NOTE — Telephone Encounter (Signed)
gve the pt's wife the oct,nov 2013 appt calendar °

## 2012-08-29 ENCOUNTER — Other Ambulatory Visit (HOSPITAL_BASED_OUTPATIENT_CLINIC_OR_DEPARTMENT_OTHER): Payer: Medicare Other

## 2012-08-29 ENCOUNTER — Ambulatory Visit: Payer: Medicare Other

## 2012-08-29 DIAGNOSIS — D649 Anemia, unspecified: Secondary | ICD-10-CM

## 2012-08-29 DIAGNOSIS — C9000 Multiple myeloma not having achieved remission: Secondary | ICD-10-CM

## 2012-08-29 LAB — COMPREHENSIVE METABOLIC PANEL (CC13)
AST: 27 U/L (ref 5–34)
BUN: 23 mg/dL (ref 7.0–26.0)
CO2: 21 mEq/L — ABNORMAL LOW (ref 22–29)
Calcium: 9.3 mg/dL (ref 8.4–10.4)
Chloride: 103 mEq/L (ref 98–107)
Creatinine: 1.2 mg/dL (ref 0.7–1.3)

## 2012-08-29 LAB — CBC WITH DIFFERENTIAL/PLATELET
BASO%: 0.7 % (ref 0.0–2.0)
EOS%: 2.2 % (ref 0.0–7.0)
LYMPH%: 30.7 % (ref 14.0–49.0)
MCH: 33.4 pg (ref 27.2–33.4)
MCHC: 34.1 g/dL (ref 32.0–36.0)
MCV: 98 fL (ref 79.3–98.0)
MONO%: 11.7 % (ref 0.0–14.0)
NEUT#: 2.7 10*3/uL (ref 1.5–6.5)
Platelets: 123 10*3/uL — ABNORMAL LOW (ref 140–400)
RBC: 3.06 10*6/uL — ABNORMAL LOW (ref 4.20–5.82)
RDW: 14.7 % — ABNORMAL HIGH (ref 11.0–14.6)

## 2012-08-29 LAB — PROTEIN / CREATININE RATIO, URINE: Total Protein, Urine: 3 mg/dL

## 2012-09-03 LAB — KAPPA/LAMBDA LIGHT CHAINS
Kappa free light chain: 13.2 mg/dL — ABNORMAL HIGH (ref 0.33–1.94)
Lambda Free Lght Chn: 2.32 mg/dL (ref 0.57–2.63)

## 2012-09-25 ENCOUNTER — Other Ambulatory Visit (HOSPITAL_BASED_OUTPATIENT_CLINIC_OR_DEPARTMENT_OTHER): Payer: Medicare Other

## 2012-09-25 DIAGNOSIS — D649 Anemia, unspecified: Secondary | ICD-10-CM

## 2012-09-25 DIAGNOSIS — C9000 Multiple myeloma not having achieved remission: Secondary | ICD-10-CM

## 2012-09-25 LAB — CBC WITH DIFFERENTIAL/PLATELET
Basophils Absolute: 0 10*3/uL (ref 0.0–0.1)
HCT: 30.1 % — ABNORMAL LOW (ref 38.4–49.9)
HGB: 10.2 g/dL — ABNORMAL LOW (ref 13.0–17.1)
MONO#: 0.7 10*3/uL (ref 0.1–0.9)
NEUT#: 3.9 10*3/uL (ref 1.5–6.5)
NEUT%: 64.4 % (ref 39.0–75.0)
WBC: 6 10*3/uL (ref 4.0–10.3)
lymph#: 1.3 10*3/uL (ref 0.9–3.3)

## 2012-09-25 LAB — COMPREHENSIVE METABOLIC PANEL (CC13)
ALT: 22 U/L (ref 0–55)
Albumin: 3.8 g/dL (ref 3.5–5.0)
BUN: 39 mg/dL — ABNORMAL HIGH (ref 7.0–26.0)
CO2: 23 mEq/L (ref 22–29)
Calcium: 9.1 mg/dL (ref 8.4–10.4)
Chloride: 105 mEq/L (ref 98–107)
Creatinine: 1.3 mg/dL (ref 0.7–1.3)

## 2012-09-25 LAB — PROTEIN / CREATININE RATIO, URINE: Creatinine, Urine: 90.6 mg/dL

## 2012-09-26 ENCOUNTER — Ambulatory Visit: Payer: Medicare Other

## 2012-09-26 ENCOUNTER — Other Ambulatory Visit: Payer: Medicare Other | Admitting: Lab

## 2012-09-27 LAB — KAPPA/LAMBDA LIGHT CHAINS
Kappa free light chain: 15.6 mg/dL — ABNORMAL HIGH (ref 0.33–1.94)
Kappa:Lambda Ratio: 7.88 — ABNORMAL HIGH (ref 0.26–1.65)

## 2012-10-02 ENCOUNTER — Ambulatory Visit (HOSPITAL_BASED_OUTPATIENT_CLINIC_OR_DEPARTMENT_OTHER): Payer: Medicare Other | Admitting: Physician Assistant

## 2012-10-02 ENCOUNTER — Telehealth: Payer: Self-pay | Admitting: Oncology

## 2012-10-02 ENCOUNTER — Ambulatory Visit (HOSPITAL_BASED_OUTPATIENT_CLINIC_OR_DEPARTMENT_OTHER): Payer: Medicare Other | Admitting: Lab

## 2012-10-02 ENCOUNTER — Encounter: Payer: Self-pay | Admitting: Physician Assistant

## 2012-10-02 VITALS — BP 132/63 | HR 71 | Temp 97.7°F | Resp 20 | Ht 70.0 in | Wt 180.8 lb

## 2012-10-02 DIAGNOSIS — D649 Anemia, unspecified: Secondary | ICD-10-CM

## 2012-10-02 DIAGNOSIS — N289 Disorder of kidney and ureter, unspecified: Secondary | ICD-10-CM

## 2012-10-02 DIAGNOSIS — E875 Hyperkalemia: Secondary | ICD-10-CM

## 2012-10-02 DIAGNOSIS — C9000 Multiple myeloma not having achieved remission: Secondary | ICD-10-CM

## 2012-10-02 DIAGNOSIS — I2510 Atherosclerotic heart disease of native coronary artery without angina pectoris: Secondary | ICD-10-CM

## 2012-10-02 DIAGNOSIS — G5790 Unspecified mononeuropathy of unspecified lower limb: Secondary | ICD-10-CM

## 2012-10-02 DIAGNOSIS — Z86718 Personal history of other venous thrombosis and embolism: Secondary | ICD-10-CM

## 2012-10-02 LAB — LIPID PANEL
HDL: 42 mg/dL (ref >39)
Triglycerides: 68 mg/dL (ref <150)

## 2012-10-02 LAB — COMPREHENSIVE METABOLIC PANEL (CC13)
AST: 24 U/L (ref 5–34)
Albumin: 3.4 g/dL — ABNORMAL LOW (ref 3.5–5.0)
BUN: 22 mg/dL (ref 7.0–26.0)
Calcium: 9.3 mg/dL (ref 8.4–10.4)
Chloride: 101 mEq/L (ref 98–107)
Glucose: 104 mg/dl — ABNORMAL HIGH (ref 70–99)
Potassium: 5.1 mEq/L (ref 3.5–5.1)

## 2012-10-02 NOTE — Progress Notes (Signed)
ID: [name]   DOB: 13-Oct-1928  MR#: [id number]  WUJ#:[id number]  HISTORY OF PRESENT ILLNESS: Patient presented in April 2007 with symptomatic slowly progressive anemia, with normal iron parameters, folic acid, and vitamin B12. Plans were made to obtain bone marrow aspirate and biopsy as an outpatient, however the patient was hospitalized shortly thereafter with a creatinine of 8.6. Renal biopsy demonstrated the presence of  light chain deposition disease. Bone marrow biopsy demonstrated 37% plasma cells. A skeletal survey showed small calvarial lytic lesions, mild osteopenia, and cervical spondylosis. With a well-established diagnosis of light-chain multiple myeloma Timothy [name] subsequent treatments are as detailed below.   INTERVAL HISTORY: Timothy [name] returns today with his wife, Timothy [name], for followup of his multiple myeloma .  Interval history is remarkable for the patient having fallen off a ladder ("about 3 steps up") this past weekend. He fell on his left side, scraped his elbow, and possibly fractured a rib. He did not want to go to an urgent care, and still declines a chest x-ray. Fortunately, he has had no significant shortness of breath and is able to take deep inspirations with only mild discomfort. His side seems to hurt worse with certain movements, like lying down or sitting up in bed.  He is still playing some golf. He is doing some gardening at home. He continues to be followed for chronic back pain and is hoping to have an injection in the next couple of months which will "deaden the nerves".  He continues to have chronic neuropathic pain, especially in the lower extremities.   REVIEW OF SYSTEMS: Timothy [name] denies any fevers, chills, or drenching night sweats. He's had no mouth ulcers or problems swallowing and denies nausea or change in bowel habits. No change in urinary habits, and specifically no dysuria or hematuria. He's had no rashes or abnormal bleeding. No cough, phlegm  production, or increased shortness of breath. He denies chest pain or palpitations. He continues to be followed at [facility] cardiology. He has had no abnormal headaches or dizziness. No peripheral swelling.  A detailed review of systems is otherwise stable and noncontributory.   PAST MEDICAL HISTORY: Past Medical History  Diagnosis Date  . ASCVD (arteriosclerotic cardiovascular disease)   . Anemia     chronic mild anemia  . Gout   . Hypertension   . Lumbar disc disease     post laminectomy  . Chronic back pain   . History of echocardiogram 11/02/2010    EF range of 30 to 35% / There is hypokinsesis of the basal-mild inferolateral myocardium  . CHF (congestive heart failure)   . Heart murmur   . Ischemic cardiomyopathy   . Renal insufficiency   . Peripheral neuropathy   . Inferior myocardial infarction 1986  . Hyperlipidemia   . SOB (shortness of breath)   . Fatigue   . Peripheral neuropathy   . Coronary artery disease     CABG 2002 by [name] with LIMA to LAD, SVG to intermediate, SVG to LCX, & SVG to PL. Last nuclear in 2012 showing large inferior scar with EF of 39%.   . Blood transfusion     DEC 2012 - TWO UNITS  . 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  PT HAS BEEN OFF CHEMO SINCE HIS HOSPITALIZATION FEB 2013 FOR LEFT FOOT INFECTION  . Cancer   . History of shingles MARCH 2009    SHINGLE LESIONS WERE AROUND RIGHT  EYE--PT HAS RESIDUAL ITCHING AROUND THE EYE.  . Osteomyelitis     s/p toe amputation  . Chronic systolic heart failure     EF of 30% per echo 02/2012  . Protein malnutrition    PAST SURGICAL HISTORY: Past Surgical History  Procedure Date  . Laminectomy   . Inguinal hernia repair   . Cardiac catheterization 06/20/2000    Severe coronary disease (totally occluded right artery, 90% left circumflex, 50-60% intermediate, and 90-95% ostial left anterior descending)   . Tonsillectomy   . Coronary artery  bypass graft 2002    x4 / Left internal mammary artery to the LAD.  / Saphenous vein graft to the right posterolateral.  /  Saphenous vein graft to  the ramus intermedius. / Saphenous vein graft to the circumflex marginal     . 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 Smoking status: Former Smoker -- 1.0 packs/day for 20 years    Types: Cigarettes    Quit date: 11/20/1984  . Smokeless tobacco: Never Used  . Alcohol Use: No     Colonoscopy:  Bone density:  Lipid panel:  Allergies  Allergen Reactions  . Morphine And Related Other (See Comments)    Could not seat still    Current Outpatient Prescriptions  Medication Sig Dispense Refill  . allopurinol (ZYLOPRIM) 300 MG tablet       . aspirin 81 MG tablet Take 1 tablet (81 mg total) by mouth every other day.  30 tablet  0  . atorvastatin (LIPITOR) 40 MG tablet Take 20 mg by mouth daily.       . B Complex-Biotin-FA (B COMPLETE PO) Take 0.5 tablets by mouth 2 (two) times daily.       . carvedilol (COREG) 6.25 MG tablet Take 1 tablet (6.25 mg total) by mouth 2 (two) times daily.  180 tablet  3  . citalopram (CELEXA) 10 MG tablet Take 2 tablets (20 mg total) by mouth at bedtime. Take one tablet daily  90 tablet  12  . Coenzyme Q10 (CO Q-10 PO) Take 1 tablet by mouth daily.       Marland Kitchen HYDROmorphone (DILAUDID) 4 MG tablet Take 1 tablet (4 mg total) by mouth every 4 (four) hours as needed.  90 tablet  0  . HYDROmorphone (DILAUDID) 4 MG tablet Take 4 mg by mouth every 4 (four) hours as needed.      Marland Kitchen lisinopril (PRINIVIL,ZESTRIL) 20 MG tablet  Take 20 mg by mouth daily.      . mirtazapine (REMERON) 7.5 MG tablet Take 7.5 mg by mouth at bedtime.      . multivitamin (THERAGRAN) per tablet Take 0.5 tablets by mouth 2 (two) times daily.       . nitroGLYCERIN (NITROSTAT) 0.4 MG SL tablet Place 1 tablet (0.4 mg total) under the tongue every 5 (five) minutes as needed for chest pain.  25 tablet  3  . Tamsulosin HCl (FLOMAX) 0.4 MG CAPS Take 1 capsule (0.4 mg total) by mouth daily.  90 capsule  1  . thiamine (VITAMIN B-1) 100 MG tablet Take 100 mg by  mouth daily.      . valACYclovir (VALTREX) 500 MG tablet Take 500 mg by mouth 2 (two) times daily. (shingles)        OBJECTIVE: Elderly white male in no acute distress Filed Vitals:   10/02/12 1434  BP: 132/63  Pulse: 71  Temp: 97.7 F (36.5 C)  Resp: 20     Body mass index is 25.94 kg/(m^2).    ECOG FS: 1 Filed Weights   10/02/12 1434  Weight: 180 lb 12.8 oz (82.01 kg)    Sclerae unicteric Oropharynx clear No peripheral adenopathy Lungs no rales or rhonchi Heart regular rate and rhythm Abd soft, nontender, positive bowel sounds  MSK no focal spinal tenderness, localized pain over the anterior left ribcage No significant peripheral edema; Neuro: nonfocal, alert and oriented x 3   LAB RESULTS:    Results for SHIMON, KANITZ (MRN [id number]) as of 05/17/2012 14:34  Ref. Range 12/27/2011 09:41 03/06/2012 18:19 03/21/2012 13:55 04/04/2012 11:08 05/10/2012 11:33  CRP Latest Range: <0.60 mg/dL  4.09 (H) 8.11    Kappa free light chain Latest Range: 0.33-1.94 mg/dL 91.47 (H)   82.95 (H) 6.21 (H)  Kappa:Lambda Ratio Latest Range: 0.26-1.65  21.18 (H)   5.81 (H) 6.15 (H)  Lambda Free Lght Chn Latest Range: 0.57-2.63 mg/dL 3.08   6.57 (H) 8.46  96/29/5284 Kappa Free Light Chain: 15.6 (Up from 13.2 in October) Lambda Free Light Chain: 1.98  (Down from 2.32 in October) Kappa:Lambda Ratio: 7.88  Protein Creatinine Ratio, Urine:  Stable at 0.06   Lab Results  Component Value Date   WBC  6.0 09/25/2012   NEUTROABS 3.9 09/25/2012   HGB 10.2* 09/25/2012   HCT 30.1* 09/25/2012   MCV 97.3 09/25/2012   PLT 144 09/25/2012      Chemistry      Component Value Date/Time   NA 130* 09/25/2012 1109   NA 134* 07/04/2012 1106   K 5.7 No visable hemolysis, Repeated and verified* 09/25/2012 1109   K 4.7 07/04/2012 1106   CL 105 09/25/2012 1109   CL 105 07/04/2012 1106   CO2 23 09/25/2012 1109   CO2 23 07/04/2012 1106   BUN 39.0* 09/25/2012 1109   BUN 33* 07/04/2012 1106   CREATININE 1.3 09/25/2012 1109   CREATININE 1.14 07/04/2012 1106      Component Value Date/Time   CALCIUM 9.1 09/25/2012 1109   CALCIUM 8.6 07/04/2012 1106   ALKPHOS 51 09/25/2012 1109   ALKPHOS 52 07/04/2012 1106   AST 23 09/25/2012 1109   AST 25 07/04/2012 1106   ALT 22 09/25/2012 1109   ALT 22 07/04/2012 1106   BILITOT 0.49 09/25/2012 1109   BILITOT 0.6 07/04/2012 1106      STUDIES: No results found.  ASSESSMENT: 78 year-old with kappa light chain multiple myeloma  (1) presenting April 2007 with anemia and renal failure; renal Bx showing free kappa light chain deposition; bone marrow biopsy showing 37% plasma cells, with normal cytogenetics and FISH;bone survey showing skull lytic lesions; treated with  (2) thalidomide (200 mg/d) and dexamethasone (40 mg/d x4d Q28d) June through Nov 2007, with good response, but poor tolerance;  (3) thalidomide decreased to 100 mg/d, dexamethasone continued, bortezomib (IV) added, Dec 2007 to March 2008  (4) treatment interrupted by multiple complications (peripheral neuropathy, pulmonary embolism, diverticular abscess, CN V zoster, severe DDD, congestive heart failure, rising PSA); maintenance zolendronic acid through March 2012  (5) progression April 2012, treated initially with dexamethasone alone, poorly tolerated;  (6)  bortezomib (sq) resumed July 2012; cyclophosphamide and dexamethasone added Sept 2012; zolendronic acid changed to Q 3months; all treatments held as of March 2013 due to  the development of the Left foot ulcer described above (7) aranesp started December 2012   PLAN:  Timothy [name] light chains are increasing very slowly, and I will be reviewing these lab results with Dr. Darnelle Catalan upon his return to the office.  Our plan is to continue  following monthly labs, and we'll see him for followup in exam in 2 months.   At this point he does not need Aranesp, and there is no clear indication to resume treatment. He and his wife both voice understanding and agreement with this plan, and know to call with any changes or problems.  Of note I am repeating a metabolic panel today to evaluate his hyperkalemia. If his potassium level is still elevated, we will be sure to make his cardiologist aware of this change as it could be associated with some of his cardiac drugs.  Per patient's request, we are also going to check his lipid panel today.  Lanitra Battaglini    10/02/2012

## 2012-10-02 NOTE — Telephone Encounter (Signed)
gve the pt's wife the dec,jan 2014 appt calendars

## 2012-10-03 ENCOUNTER — Ambulatory Visit: Payer: Medicare Other | Admitting: Physician Assistant

## 2012-10-03 ENCOUNTER — Other Ambulatory Visit: Payer: Self-pay | Admitting: *Deleted

## 2012-10-03 MED ORDER — HYDROMORPHONE HCL 4 MG PO TABS: 4.0000 mg | ORAL_TABLET | ORAL | Status: DC | PRN

## 2012-10-03 NOTE — Telephone Encounter (Signed)
This RN called pt and informed him of labs obtained 11/13 with  potassium now WNL- but noted decrease in sodium. Per discussion he understands to increase sodium intake as in use of chicken boullion.  Pt also requested prescription refill of his pain med to be mailed to him.

## 2012-11-06 ENCOUNTER — Other Ambulatory Visit (HOSPITAL_BASED_OUTPATIENT_CLINIC_OR_DEPARTMENT_OTHER): Payer: Medicare Other

## 2012-11-06 DIAGNOSIS — D649 Anemia, unspecified: Secondary | ICD-10-CM

## 2012-11-06 DIAGNOSIS — C9000 Multiple myeloma not having achieved remission: Secondary | ICD-10-CM

## 2012-11-06 DIAGNOSIS — E875 Hyperkalemia: Secondary | ICD-10-CM

## 2012-11-06 LAB — COMPREHENSIVE METABOLIC PANEL (CC13)
ALT: 22 U/L (ref 0–55)
AST: 28 U/L (ref 5–34)
CO2: 25 mEq/L (ref 22–29)
Chloride: 104 mEq/L (ref 98–107)
Sodium: 139 mEq/L (ref 136–145)
Total Bilirubin: 0.61 mg/dL (ref 0.20–1.20)
Total Protein: 6.2 g/dL — ABNORMAL LOW (ref 6.4–8.3)

## 2012-11-06 LAB — CBC WITH DIFFERENTIAL/PLATELET
EOS%: 4.7 % (ref 0.0–7.0)
MCH: 32.6 pg (ref 27.2–33.4)
MCV: 96.3 fL (ref 79.3–98.0)
MONO%: 9.9 % (ref 0.0–14.0)
RBC: 3.03 10*6/uL — ABNORMAL LOW (ref 4.20–5.82)
RDW: 15.7 % — ABNORMAL HIGH (ref 11.0–14.6)

## 2012-11-06 LAB — PROTEIN / CREATININE RATIO, URINE: Protein Creatinine Ratio: 0.11 (ref <0.15)

## 2012-11-07 LAB — IGG, IGA, IGM: IgM, Serum: 47 mg/dL (ref 41–251)

## 2012-11-07 LAB — KAPPA/LAMBDA LIGHT CHAINS
Kappa free light chain: 16.4 mg/dL — ABNORMAL HIGH (ref 0.33–1.94)
Lambda Free Lght Chn: 2.04 mg/dL (ref 0.57–2.63)

## 2012-12-03 ENCOUNTER — Other Ambulatory Visit (HOSPITAL_BASED_OUTPATIENT_CLINIC_OR_DEPARTMENT_OTHER): Payer: Medicare Other

## 2012-12-03 DIAGNOSIS — C9000 Multiple myeloma not having achieved remission: Secondary | ICD-10-CM

## 2012-12-03 DIAGNOSIS — E875 Hyperkalemia: Secondary | ICD-10-CM

## 2012-12-03 LAB — COMPREHENSIVE METABOLIC PANEL (CC13)
Albumin: 3.7 g/dL (ref 3.5–5.0)
Alkaline Phosphatase: 54 U/L (ref 40–150)
CO2: 22 mEq/L (ref 22–29)
Calcium: 9.8 mg/dL (ref 8.4–10.4)
Chloride: 103 mEq/L (ref 98–107)
Glucose: 98 mg/dl (ref 70–99)
Potassium: 4.7 mEq/L (ref 3.5–5.1)
Sodium: 134 mEq/L — ABNORMAL LOW (ref 136–145)
Total Protein: 7.2 g/dL (ref 6.4–8.3)

## 2012-12-03 LAB — CBC WITH DIFFERENTIAL/PLATELET
BASO%: 0.7 % (ref 0.0–2.0)
EOS%: 3.5 % (ref 0.0–7.0)
LYMPH%: 21.9 % (ref 14.0–49.0)
MCH: 32.1 pg (ref 27.2–33.4)
MCHC: 34 g/dL (ref 32.0–36.0)
MCV: 94.6 fL (ref 79.3–98.0)
MONO#: 0.4 10*3/uL (ref 0.1–0.9)
MONO%: 6 % (ref 0.0–14.0)
Platelets: 151 10*3/uL (ref 140–400)
RBC: 3.27 10*6/uL — ABNORMAL LOW (ref 4.20–5.82)
WBC: 7.4 10*3/uL (ref 4.0–10.3)

## 2012-12-04 LAB — KAPPA/LAMBDA LIGHT CHAINS: Kappa free light chain: 14.1 mg/dL — ABNORMAL HIGH (ref 0.33–1.94)

## 2012-12-10 ENCOUNTER — Ambulatory Visit (HOSPITAL_BASED_OUTPATIENT_CLINIC_OR_DEPARTMENT_OTHER): Payer: Medicare Other | Admitting: Oncology

## 2012-12-10 ENCOUNTER — Telehealth: Payer: Self-pay | Admitting: Oncology

## 2012-12-10 VITALS — BP 130/68 | HR 76 | Temp 98.1°F | Resp 20 | Ht 70.0 in | Wt 180.0 lb

## 2012-12-10 DIAGNOSIS — C9000 Multiple myeloma not having achieved remission: Secondary | ICD-10-CM

## 2012-12-10 DIAGNOSIS — M549 Dorsalgia, unspecified: Secondary | ICD-10-CM

## 2012-12-10 MED ORDER — HYDROMORPHONE HCL 4 MG PO TABS: 4.0000 mg | ORAL_TABLET | ORAL | Status: DC | PRN

## 2012-12-10 NOTE — Telephone Encounter (Signed)
gv wife appt schedule for February thru July.

## 2012-12-10 NOTE — Progress Notes (Signed)
ID: Timothy [name]   DOB: 05-Mar-1928  MR#: [id number]  NWG#:[id number]  PCP: SU: OTHER MD: Timothy [name]  HISTORY OF PRESENT ILLNESS: Patient presented in April 2007 with symptomatic slowly progressive anemia, with normal iron parameters, folic acid, and vitamin B12. Plans were made to obtain bone marrow aspirate and biopsy as an outpatient, however the patient was hospitalized shortly thereafter with a creatinine of 8.6. Renal biopsy demonstrated the presence of  light chain deposition disease. Bone marrow biopsy demonstrated 37% plasma cells. A skeletal survey showed small calvarial lytic lesions, mild osteopenia, and cervical spondylosis. With a well-established diagnosis of light-chain multiple myeloma [name] subsequent treatments are as detailed below.   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 The interval history is unremarkable. He is not playing much golf because of the cold weather. He is having some epidural injections under Dr. Modesto Charon at [facility] orth[facility]ics, with variable results. Timothy [name] has gone on a walking program and is very pleased at losing a few pounds.   REVIEW OF SYSTEMS: The problems with date continue to be his severe back pain and his neuropathic pain. He has stopped his antidepressants, which were keeping him from sleeping, and he has a much better sleep toilet now, though he does have nocturia x3 he still. He tells me his urinary flow is good. There have been no fevers, rash, or bleeding. There are no new sites of pain. Occasionally he has discomfort in his right upper quadrant which can move up to just below the liver I think these are going to be gas pains. We discussed that to deal with that, but they resolved spontaneously and in any case are infrequent. He may be developing early macular degeneration problems, according to his eye doctor, he tells me. He is tolerating the Dilantin for his back pain without  any constipation issues. A detailed review of systems was otherwise stable.   PAST MEDICAL HISTORY: Past Medical History  Diagnosis Date  . ASCVD (arteriosclerotic cardiovascular disease)   . Anemia     chronic mild anemia  . Gout   . Hypertension   . Lumbar disc disease     post laminectomy  . Chronic back pain   . History of echocardiogram 11/02/2010    EF range of 30 to 35% / There is hypokinsesis of the basal-mild inferolateral myocardium  . CHF (congestive heart failure)   . Heart murmur   . Ischemic cardiomyopathy   . Renal insufficiency   . Peripheral neuropathy   . Inferior myocardial infarction 1986  . Hyperlipidemia   . SOB (shortness of breath)   . Fatigue   . Peripheral neuropathy   . Coronary artery disease     CABG 2002 by [name] with LIMA to LAD, SVG to intermediate, SVG to LCX, & SVG to PL. Last nuclear in 2012 showing large inferior scar with EF of 39%.   . Blood transfusion     DEC 2012 - TWO UNITS  . 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  PT HAS BEEN OFF CHEMO SINCE HIS HOSPITALIZATION FEB 2013 FOR LEFT FOOT INFECTION  . Cancer   . History of shingles MARCH 2009    SHINGLE LESIONS WERE AROUND RIGHT EYE--PT HAS RESIDUAL ITCHING AROUND THE EYE.  . Osteomyelitis     s/p toe amputation  . Chronic systolic heart failure     EF of  30% per echo 02/2012  . Protein malnutrition    PAST SURGICAL HISTORY: Past Surgical History  Procedure Date  . Laminectomy   . Inguinal hernia repair   . Cardiac catheterization 06/20/2000    Severe coronary disease (totally occluded right artery, 90% left circumflex, 50-60% intermediate, and 90-95% ostial left anterior descending)   . Tonsillectomy   . Coronary artery bypass graft 2002    x4 / Left internal mammary artery to the LAD.  / Saphenous vein graft to the right posterolateral.  /  Saphenous vein graft to  the ramus intermedius. / Saphenous vein graft to the  circumflex marginal     . 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 Smoking status: Former Smoker -- 1.0 packs/day for 20 years    Types: Cigarettes    Quit date: 11/20/1984  . Smokeless tobacco: Never Used  . Alcohol Use: No     Colonoscopy:  Bone density:  Lipid panel:  Allergies  Allergen Reactions  . Morphine And Related Other (See Comments)    Could not seat still    Current Outpatient Prescriptions  Medication Sig Dispense Refill  . allopurinol (ZYLOPRIM) 300 MG tablet       . aspirin 81 MG tablet Take 1 tablet (81 mg total) by mouth every other day.  30 tablet  0  . atorvastatin (LIPITOR) 40 MG tablet Take 20 mg by mouth daily.       . B Complex-Biotin-FA (B COMPLETE PO) Take 0.5 tablets by mouth 2 (two) times daily.       . carvedilol (COREG) 6.25 MG tablet Take 1 tablet (6.25 mg total) by mouth 2 (two) times daily.  180 tablet  3  . citalopram (CELEXA) 10 MG tablet Take 2 tablets (20 mg total) by mouth at bedtime. Take one tablet daily  90 tablet  12  . Coenzyme Q10 (CO Q-10 PO) Take 1 tablet by mouth daily.       Marland Kitchen HYDROmorphone (DILAUDID) 4 MG tablet Take 1 tablet (4 mg total) by mouth every 4 (four) hours as needed.  90 tablet  0  . HYDROmorphone (DILAUDID) 4 MG tablet Take 1 tablet (4 mg total) by mouth every 4 (four) hours as needed.  90 tablet  0  . lisinopril (PRINIVIL,ZESTRIL) 20 MG tablet Take 20 mg by mouth daily.      . mirtazapine (REMERON) 7.5 MG tablet Take 7.5 mg by mouth at bedtime.      . multivitamin (THERAGRAN) per tablet Take 0.5 tablets by mouth 2  (two) times daily.       . nitroGLYCERIN (NITROSTAT) 0.4 MG SL tablet Place 1 tablet (0.4 mg total) under the tongue every 5 (five) minutes as needed for chest pain.  25 tablet  3  . Tamsulosin HCl (FLOMAX) 0.4 MG CAPS Take 1 capsule (0.4 mg total) by mouth daily.  90 capsule  1  . thiamine (VITAMIN B-1) 100 MG tablet Take 100 mg by mouth daily.      . valACYclovir (VALTREX) 500 MG tablet Take 500 mg by mouth 2 (two) times daily. (shingles)  OBJECTIVE: Elderly white male in no acute distress Filed Vitals:   12/10/12 1142  BP: 130/68  Pulse: 76  Temp: 98.1 F (36.7 [name])  Resp: 20     Body mass index is 25.83 kg/(m^2).    ECOG FS: 1 Filed Weights   12/10/12 1142  Weight: 180 lb (81.647 kg)    Sclerae unicteric Oropharynx clear No peripheral adenopathy Lungs no rales or rhonchi Heart regular rate and rhythm Abd soft, nontender including in the right upper quadrant area, positive bowel sounds, no masses palpated MSK no focal spinal tenderness to moderate palpation,  No significant peripheral edema; Neuro: nonfocal, well oriented, anxious affect   LAB RESULTS:   Results for Timothy [name], Timothy [name] (MRN [id number]) as of 12/10/2012 11:55  Ref. Range 08/01/2012 10:49 08/29/2012 11:13 09/25/2012 11:09 11/06/2012 10:51 12/03/2012 13:10  IgG (Immunoglobin G), Serum Latest Range: [phone] mg/dL    629   IgA Latest Range: 68-379 mg/dL    528   IgM, Serum Latest Range: 41-251 mg/dL    47   Kappa free light chain Latest Range: 0.33-1.94 mg/dL 41.32 (H) 44.01 (H) 02.72 (H) 16.40 (H) 14.10 (H)  Lambda Free Lght Chn Latest Range: 0.57-2.63 mg/dL 5.36 (H) 6.44 0.34 7.42 2.14  Kappa:Lambda Ratio Latest Range: 0.26-1.65  4.47 (H) 5.69 (H) 7.88 (H) 8.04 (H) 6.59 (H)    Lab Results  Component Value Date   WBC 7.4 12/03/2012   NEUTROABS 5.0 12/03/2012   HGB 10.5* 12/03/2012   HCT 30.9* 12/03/2012   MCV 94.6 12/03/2012   PLT 151 12/03/2012      Chemistry      Component Value Date/Time   NA 134*  12/03/2012 1310   NA 134* 07/04/2012 1106   K 4.7 12/03/2012 1310   K 4.7 07/04/2012 1106   CL 103 12/03/2012 1310   CL 105 07/04/2012 1106   CO2 22 12/03/2012 1310   CO2 23 07/04/2012 1106   BUN 28.0* 12/03/2012 1310   BUN 33* 07/04/2012 1106   CREATININE 1.3 12/03/2012 1310   CREATININE 1.14 07/04/2012 1106      Component Value Date/Time   CALCIUM 9.8 12/03/2012 1310   CALCIUM 8.6 07/04/2012 1106   ALKPHOS 54 12/03/2012 1310   ALKPHOS 52 07/04/2012 1106   AST 27 12/03/2012 1310   AST 25 07/04/2012 1106   ALT 13 12/03/2012 1310   ALT 22 07/04/2012 1106   BILITOT 0.73 12/03/2012 1310   BILITOT 0.6 07/04/2012 1106      STUDIES: No results found.   ASSESSMENT: 79 year-old with kappa light chain multiple myeloma  (1) presenting April 2007 with anemia and renal failure; renal Bx showing free kappa light chain deposition; bone marrow biopsy showing 37% plasma cells, with normal cytogenetics and FISH;bone survey showing skull lytic lesions; treated with  (2) thalidomide (200 mg/d) and dexamethasone (40 mg/d x4d Q28d) June through Nov 2007, with good response, but poor tolerance;  (3) thalidomide decreased to 100 mg/d, dexamethasone continued, bortezomib (IV) added, Dec 2007 to March 2008  (4) treatment interrupted by multiple complications (peripheral neuropathy, pulmonary embolism, diverticular abscess, CN V zoster, severe DDD, congestive heart failure, rising PSA); maintenance zolendronic acid through March 2012  (5) progression April 2012, treated initially with dexamethasone alone, poorly tolerated;  (6) bortezomib (sq) resumed July 2012; cyclophosphamide and dexamethasone added Sept 2012; zolendronic acid changed to Q 3months; all treatments held as of March 2013 due to the development of the Left foot ulcer described above (7) anemia, on  aranesp December 2012 to August 2013   PLAN:  Timothy [name] is doing well overall, with his chronic problems unchanged. I have refilled his Tylenol it, and he stable he  receives 90 tablets every 28 days, which allows him to function at a semi-normal level and does not seem to cause him any side effects or complications. I do not see evidence of myeloma progression at present and I do not believe any of his symptoms are directly caused by the disease itself. Accordingly we are holding off on further treatment for now, and will continue to check his lab work on a monthly basis and see him on a every 3 month basis. He knows to call for any problems that may develop before the next visit. Timothy [name],Timothy [name]    12/10/2012

## 2012-12-13 ENCOUNTER — Ambulatory Visit (INDEPENDENT_AMBULATORY_CARE_PROVIDER_SITE_OTHER): Payer: Medicare Other | Admitting: Nurse Practitioner

## 2012-12-13 ENCOUNTER — Other Ambulatory Visit: Payer: Self-pay

## 2012-12-13 ENCOUNTER — Encounter: Payer: Self-pay | Admitting: Nurse Practitioner

## 2012-12-13 VITALS — BP 110/56 | HR 64 | Resp 12 | Ht 70.0 in | Wt 180.8 lb

## 2012-12-13 DIAGNOSIS — R0600 Dyspnea, unspecified: Secondary | ICD-10-CM

## 2012-12-13 DIAGNOSIS — R0609 Other forms of dyspnea: Secondary | ICD-10-CM

## 2012-12-13 DIAGNOSIS — R0989 Other specified symptoms and signs involving the circulatory and respiratory systems: Secondary | ICD-10-CM

## 2012-12-13 LAB — CBC WITH DIFFERENTIAL/PLATELET
Basophils Absolute: 0 10*3/uL (ref 0.0–0.1)
Basophils Relative: 0.3 % (ref 0.0–3.0)
Eosinophils Absolute: 0.3 10*3/uL (ref 0.0–0.7)
Eosinophils Relative: 4.3 % (ref 0.0–5.0)
HCT: 31.6 % — ABNORMAL LOW (ref 39.0–52.0)
Hemoglobin: 10.5 g/dL — ABNORMAL LOW (ref 13.0–17.0)
Lymphocytes Relative: 16.6 % (ref 12.0–46.0)
Lymphs Abs: 1.2 10*3/uL (ref 0.7–4.0)
MCHC: 33.3 g/dL (ref 30.0–36.0)
MCV: 95 fl (ref 78.0–100.0)
Monocytes Absolute: 0.8 10*3/uL (ref 0.1–1.0)
Monocytes Relative: 11.1 % (ref 3.0–12.0)
Neutro Abs: 5.1 10*3/uL (ref 1.4–7.7)
Neutrophils Relative %: 67.7 % (ref 43.0–77.0)
Platelets: 165 10*3/uL (ref 150.0–400.0)
RBC: 3.33 Mil/uL — ABNORMAL LOW (ref 4.22–5.81)
RDW: 16 % — ABNORMAL HIGH (ref 11.5–14.6)
WBC: 7.5 10*3/uL (ref 4.5–10.5)

## 2012-12-13 LAB — BASIC METABOLIC PANEL
BUN: 50 mg/dL — ABNORMAL HIGH (ref 6–23)
CO2: 23 mEq/L (ref 19–32)
Calcium: 9 mg/dL (ref 8.4–10.5)
Chloride: 106 mEq/L (ref 96–112)
Creatinine, Ser: 1.6 mg/dL — ABNORMAL HIGH (ref 0.4–1.5)
GFR: 43.24 mL/min — ABNORMAL LOW (ref >60.00)
Glucose, Bld: 89 mg/dL (ref 70–99)
Potassium: 4.7 mEq/L (ref 3.5–5.1)
Sodium: 134 mEq/L — ABNORMAL LOW (ref 135–145)

## 2012-12-13 LAB — BRAIN NATRIURETIC PEPTIDE: Pro B Natriuretic peptide (BNP): 84 pg/mL (ref 0.0–100.0)

## 2012-12-13 MED ORDER — ATORVASTATIN CALCIUM 40 MG PO TABS: 20.0000 mg | ORAL_TABLET | Freq: Every day | ORAL | Status: DC

## 2012-12-13 NOTE — Patient Instructions (Addendum)
We are going to check an EKG today  We are going to check labs today  If your fluid level test is elevated, I will call you in some Lasix to take  We are going to update your stress test  Call the [facility] office at [phone] if you have any questions, problems or concerns.

## 2012-12-13 NOTE — Progress Notes (Addendum)
Timothy [name] Date of Birth: 01/04/1928 Medical Record #[id number]  History of Present Illness: Timothy [name] is seen back today for a work in visit. He is seen for Dr. Swaziland. He has a complex medical history which includes an ischemic Cm with an EF of 30%. He has multiple myeloma and recent toe amputations for osteomyelitis. Followed closely by oncology. Other problems include HLD, depression, remote CABG in 2002, CKD, neuropathy, anemia and gout.   He comes in today. He is here with his wife. He is here today because of dyspnea. Seems to be getting worse. Family has noticed it more. They are concerned. His weight is up but he says it is from eating better. Still quite limited from back pain. No cough. No swelling. No chest pain. No extra salt use. He is worried about his bypass grafts - now almost 79 years old. Presenting symptoms then were shortness of breath. Never had chest pain. No PND. He is pretty sedentary with the cold weather and from his back pain.   Current Outpatient Prescriptions on File Prior to Visit  Medication Sig Dispense Refill  . allopurinol (ZYLOPRIM) 300 MG tablet       . aspirin 81 MG tablet Take 1 tablet (81 mg total) by mouth every other day.  30 tablet  0  . atorvastatin (LIPITOR) 40 MG tablet Take 20 mg by mouth daily.       . B Complex-Biotin-FA (B COMPLETE PO) Take 0.5 tablets by mouth 2 (two) times daily.       . carvedilol (COREG) 6.25 MG tablet Take 1 tablet (6.25 mg total) by mouth 2 (two) times daily.  180 tablet  3  . Coenzyme Q10 (CO Q-10 PO) Take 1 tablet by mouth daily.       Marland Kitchen HYDROmorphone (DILAUDID) 4 MG tablet Take 1 tablet (4 mg total) by mouth every 4 (four) hours as needed.  90 tablet  0  . lisinopril (PRINIVIL,ZESTRIL) 20 MG tablet Take 20 mg by mouth daily.      . nitroGLYCERIN (NITROSTAT) 0.4 MG SL tablet Place 1 tablet (0.4 mg total) under the tongue every 5 (five) minutes as needed for chest pain.  25 tablet  3  . Tamsulosin HCl (FLOMAX) 0.4 MG CAPS  Take 1 capsule (0.4 mg total) by mouth daily.  90 capsule  1  . thiamine (VITAMIN B-1) 100 MG tablet Take 100 mg by mouth daily.      . valACYclovir (VALTREX) 500 MG tablet Take 500 mg by mouth 2 (two) times daily. (shingles)      . HYDROmorphone (DILAUDID) 4 MG tablet Take 1 tablet (4 mg total) by mouth every 4 (four) hours as needed.  90 tablet  0    Allergies  Allergen Reactions  . Morphine And Related Other (See Comments)    Could not seat still    Past Medical History  Diagnosis Date  . ASCVD (arteriosclerotic cardiovascular disease)   . Anemia     chronic mild anemia  . Gout   . Hypertension   . Lumbar disc disease     post laminectomy  . Chronic back pain   . History of echocardiogram 11/02/2010    EF range of 30 to 35% / There is hypokinsesis of the basal-mild inferolateral myocardium  . CHF (congestive heart failure)   . Heart murmur   . Ischemic cardiomyopathy   . Renal insufficiency   . Peripheral neuropathy   . Inferior myocardial infarction 1986  .  Hyperlipidemia   . SOB (shortness of breath)   . Fatigue   . Peripheral neuropathy   . Coronary artery disease     CABG 2002 by [name] with LIMA to LAD, SVG to intermediate, SVG to LCX, & SVG to PL. Last nuclear in 2012 showing large inferior scar with EF of 39%.   . Blood transfusion     DEC 2012 - TWO UNITS  . 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  PT HAS BEEN OFF CHEMO SINCE HIS HOSPITALIZATION FEB 2013 FOR LEFT FOOT INFECTION  . Cancer   . History of shingles MARCH 2009    SHINGLE LESIONS WERE AROUND RIGHT EYE--PT HAS RESIDUAL ITCHING AROUND THE EYE.  . Osteomyelitis     s/p toe amputation  . Chronic systolic heart failure     EF of 30% per echo 02/2012  . Protein malnutrition     Past Surgical History  Procedure Date  . Laminectomy   . Inguinal hernia repair   . Cardiac catheterization 06/20/2000    Severe coronary disease (totally occluded  right artery, 90% left circumflex, 50-60% intermediate, and 90-95% ostial left anterior descending)   . Tonsillectomy   . Coronary artery bypass graft 2002    x4 / Left internal mammary artery to the LAD.  / Saphenous vein graft to the right posterolateral.  /  Saphenous vein graft to  the ramus intermedius. / Saphenous vein graft to the circumflex marginal     . Amputation 03/06/2012    Procedure: AMPUTATION RAY;  Surgeon: Harvie Junior, MD;  Location: [facility];  Service: Orthopedics;  Laterality: Left;  First Ray Amputation    History  Smoking status  . Former Smoker -- 1.0 packs/day for 20 years  . Types: Cigarettes  . Quit date: 11/20/1984  Smokeless tobacco  . Never Used    History  Alcohol Use No    History reviewed. No pertinent family history.  Review of Systems: The review of systems is per the HPI.  All other systems were reviewed and are negative.  Physical Exam: BP 110/56  Pulse 64  Resp 12  Ht 5\' 10"  (1.778 m)  Wt 180 lb 12 oz (81.988 kg)  BMI 25.94 kg/m2  SpO2 98% Patient is very pleasant and in no acute distress. Seems a little fatigued to me. Weight is up 6 pounds. Skin is warm and dry. Color is normal.  HEENT is unremarkable. Normocephalic/atraumatic. PERRL. Sclera are nonicteric. Neck is supple. No masses. No JVD. Lungs are clear. Cardiac exam shows an irregular rhythm. Rate is ok. Abdomen is soft. Extremities are without edema. Gait and ROM are intact. No gross neurologic deficits noted.   LABORATORY DATA: Pending  Lab Results  Component Value Date   WBC 7.4 12/03/2012   HGB 10.5* 12/03/2012   HCT 30.9* 12/03/2012   PLT 151 12/03/2012   GLUCOSE 98 12/03/2012   CHOL 94 10/02/2012   TRIG 68 10/02/2012   HDL 42 10/02/2012   LDLCALC 38 10/02/2012   ALT 13 12/03/2012   AST 27 12/03/2012   NA 134* 12/03/2012   K 4.7 12/03/2012   CL 103 12/03/2012   CREATININE 1.3 12/03/2012   BUN 28.0* 12/03/2012   CO2 22 12/03/2012   TSH 1.84 03/20/2012   PSA 3.75 05/30/2006    INR 1.10 03/01/2012   Echo Study Conclusions from April 2013  - Left ventricle: diffuse hypokinesis with mid and basal  inferior wall akinesis The cavity size was moderately dilated. Wall thickness was increased in a pattern of mild LVH. The estimated ejection fraction was 30%. - Mitral valve: Mild regurgitation. - Left atrium: The atrium was mildly dilated. - Atrial septum: No defect or patent foramen ovale was identified.   Assessment / Plan: 1. Dyspnea - questionable etiology. Will check labs to include BNP today. Also check EKG. Seems to have more skips noted on his exam today. For now, no change in his medicines but may need to add Lasix.   2. CAD - remote CABG 12 years ago. He is worried. Will update his Myoview  3. Chronic back pain - this is his most limiting factor.  4. HLD - on statin therapy.  5. Multiple myeloma - followed by oncology. Currently off of treatment.   Patient is agreeable to this plan and will call if any problems develop in the interim.    Addendum: EKG today shows sinus with PVC's. No atrial fib noted.

## 2012-12-16 ENCOUNTER — Other Ambulatory Visit: Payer: Medicare Other

## 2012-12-16 ENCOUNTER — Telehealth: Payer: Self-pay | Admitting: *Deleted

## 2012-12-16 ENCOUNTER — Other Ambulatory Visit: Payer: Self-pay | Admitting: *Deleted

## 2012-12-16 ENCOUNTER — Encounter: Payer: Self-pay | Admitting: Cardiology

## 2012-12-16 DIAGNOSIS — N289 Disorder of kidney and ureter, unspecified: Secondary | ICD-10-CM

## 2012-12-16 NOTE — Telephone Encounter (Signed)
lori said pt can use tylenol pm or benadryl, pt agreed

## 2012-12-18 ENCOUNTER — Telehealth: Payer: Self-pay | Admitting: *Deleted

## 2012-12-18 ENCOUNTER — Other Ambulatory Visit: Payer: Self-pay | Admitting: *Deleted

## 2012-12-18 ENCOUNTER — Ambulatory Visit (HOSPITAL_COMMUNITY): Payer: Medicare Other | Attending: Cardiology | Admitting: Radiology

## 2012-12-18 ENCOUNTER — Other Ambulatory Visit (INDEPENDENT_AMBULATORY_CARE_PROVIDER_SITE_OTHER): Payer: Medicare Other

## 2012-12-18 VITALS — BP 154/71 | Ht 70.0 in | Wt 176.0 lb

## 2012-12-18 DIAGNOSIS — R0609 Other forms of dyspnea: Secondary | ICD-10-CM | POA: Insufficient documentation

## 2012-12-18 DIAGNOSIS — R0600 Dyspnea, unspecified: Secondary | ICD-10-CM

## 2012-12-18 DIAGNOSIS — E875 Hyperkalemia: Secondary | ICD-10-CM

## 2012-12-18 DIAGNOSIS — N289 Disorder of kidney and ureter, unspecified: Secondary | ICD-10-CM

## 2012-12-18 DIAGNOSIS — R0989 Other specified symptoms and signs involving the circulatory and respiratory systems: Secondary | ICD-10-CM | POA: Insufficient documentation

## 2012-12-18 DIAGNOSIS — R002 Palpitations: Secondary | ICD-10-CM | POA: Insufficient documentation

## 2012-12-18 DIAGNOSIS — I428 Other cardiomyopathies: Secondary | ICD-10-CM | POA: Insufficient documentation

## 2012-12-18 DIAGNOSIS — I4949 Other premature depolarization: Secondary | ICD-10-CM

## 2012-12-18 DIAGNOSIS — I10 Essential (primary) hypertension: Secondary | ICD-10-CM | POA: Insufficient documentation

## 2012-12-18 DIAGNOSIS — I2510 Atherosclerotic heart disease of native coronary artery without angina pectoris: Secondary | ICD-10-CM

## 2012-12-18 LAB — BASIC METABOLIC PANEL
BUN: 45 mg/dL — ABNORMAL HIGH (ref 6–23)
CO2: 22 mEq/L (ref 19–32)
Calcium: 10.1 mg/dL (ref 8.4–10.5)
Chloride: 104 mEq/L (ref 96–112)
Creatinine, Ser: 1.4 mg/dL (ref 0.4–1.5)
GFR: 53.75 mL/min — ABNORMAL LOW (ref >60.00)
Glucose, Bld: 112 mg/dL — ABNORMAL HIGH (ref 70–99)
Potassium: 6.2 mEq/L (ref 3.5–5.1)
Sodium: 131 mEq/L — ABNORMAL LOW (ref 135–145)

## 2012-12-18 MED ORDER — TECHNETIUM TC 99M SESTAMIBI GENERIC - CARDIOLITE
11.0000 | Freq: Once | INTRAVENOUS | Status: AC | PRN
  Administered 2012-12-18: 11 via INTRAVENOUS

## 2012-12-18 MED ORDER — REGADENOSON 0.4 MG/5ML IV SOLN
0.4000 mg | Freq: Once | INTRAVENOUS | Status: AC
  Administered 2012-12-18: 0.4 mg via INTRAVENOUS

## 2012-12-18 MED ORDER — TECHNETIUM TC 99M SESTAMIBI GENERIC - CARDIOLITE
33.0000 | Freq: Once | INTRAVENOUS | Status: AC | PRN
  Administered 2012-12-18: 33 via INTRAVENOUS

## 2012-12-18 NOTE — Telephone Encounter (Signed)
S/w pt today for a 6.2 potassium explained to pt to limit his potassium intake and come in on Friday for a repeat bmet pt was agreeable to plan and understood verbally

## 2012-12-18 NOTE — Progress Notes (Signed)
[facility] SITE 3 NUCLEAR MED [address] [place], [place] [address] [phone]    Cardiology Nuclear Med Study  Timothy [name] is a 79 y.o. male     MRN : [id number]     DOB: [birth year]  Procedure Date: 12/18/2012  Nuclear Med Background Indication for Stress Test:  Evaluation for Ischemia and Graft Patency History:  '01 Heart Cath, '02 CABG x4, Cardiomyopathy EF: 30%, 2012 MPS: EF: 39% Large inferior scar (-) ischemia, 02/2012 ECHO: EF: 30% mild LVH  1986 MI: IWMI  Cardiac Risk Factors: History of Smoking, Hypertension and Lipids  Symptoms:  DOE, Fatigue, Palpitations and SOB   Nuclear Pre-Procedure Caffeine/Decaff Intake:  None > 12 hrs NPO After: 10:30pm   Lungs:  clear O2 Sat: 99% on room air. IV 0.9% NS with Angio Cath:  20g  IV Site: R Antecubital X 1, tolerated well IV Started by:  Irean Hong, RN  Chest Size (in):  44 Cup Size: n/a  Height: 5\' 10"  (1.778 m)  Weight:  176 lb (79.833 kg)  BMI:  Body mass index is 25.25 kg/(m^2). Tech Comments:  Took coreg this am    Nuclear Med Study 1 or 2 day study: 1 day  Stress Test Type:  Lexiscan  Reading MD: Cassell Clement, MD  Order Authorizing Provider:  Peter Swaziland, MD, and Norma Fredrickson, NP  Resting Radionuclide: Technetium 28m Sestamibi  Resting Radionuclide Dose: 11.0 mCi   Stress Radionuclide:  Technetium 58m Sestamibi  Stress Radionuclide Dose: 33.0 mCi           Stress Protocol Rest HR: 57 Stress HR: 78  Rest BP: 154/71 Stress BP: 149/59  Exercise Time (min): n/a METS: n/a   Predicted Max HR: 138 bpm % Max HR: 56.52 bpm Rate Pressure Product: 21308    Dose of Adenosine (mg):  n/a Dose of Lexiscan: 0.4 mg  Dose of Atropine (mg): n/a Dose of Dobutamine: n/a mcg/kg/min (at max HR)  Stress Test Technologist: Milana Na, EMT-P  Nuclear Technologist:  Doyne Keel, CNMT     Rest Procedure:  Myocardial perfusion imaging was performed at rest 45 minutes following the intravenous  administration of Technetium 49m Sestamibi. Rest ECG: NSR with old inferior wall MI  Stress Procedure:  The patient received IV Lexiscan 0.4 mg over 15-seconds.  Technetium 67m Sestamibi injected at 30-seconds.  The patient had dizziness, chest pain, nausea, and rare pvcs/pat. Quantitative spect images were obtained after a 45 minute delay. Stress ECG: No significant change from baseline ECG  QPS Raw Data Images:  Normal; no motion artifact; normal heart/lung ratio. Stress Images:  There is decreased uptake in the inferior wall. Rest Images:  There is decreased uptake in the inferior wall. Subtraction (SDS):  There is a fixed defect that is most consistent with a previous infarction. Transient Ischemic Dilatation (Normal <1.22):  0.95 Lung/Heart Ratio (Normal <0.45):  0.33  Quantitative Gated Spect Images QGS EDV:  166 ml QGS ESV:  98 ml  Impression Exercise Capacity:  Lexiscan with no exercise. BP Response:  Normal blood pressure response. Clinical Symptoms:  Dizziness, nausea and chest pain. ECG Impression:  No significant ECG changes with Lexiscan. Pattern of old DMI unchanged. Comparison with Prior Nuclear Study: No images to compare  Overall Impression:  Intermediate stress nuclear study. There is a medium size scar involving the basal inferior and basal inferolateral segments.  There is minimal peri-infarct ischemia (SDS 4). There is moderate LV systolic dysfunction. EF has increased since  last Myoview from 39% to 41%.  LV Ejection Fraction: 41%.  LV Wall Motion:  The LV ejection fraction is 41% with inferior wall hypokinesis.  Cassell Clement

## 2012-12-20 ENCOUNTER — Other Ambulatory Visit (INDEPENDENT_AMBULATORY_CARE_PROVIDER_SITE_OTHER): Payer: Medicare Other

## 2012-12-20 DIAGNOSIS — E875 Hyperkalemia: Secondary | ICD-10-CM

## 2012-12-20 LAB — BASIC METABOLIC PANEL
BUN: 49 mg/dL — ABNORMAL HIGH (ref 6–23)
CO2: 23 mEq/L (ref 19–32)
Calcium: 9.7 mg/dL (ref 8.4–10.5)
Chloride: 106 mEq/L (ref 96–112)
Creatinine, Ser: 1.3 mg/dL (ref 0.4–1.5)
GFR: 54.68 mL/min — ABNORMAL LOW (ref >60.00)
Glucose, Bld: 88 mg/dL (ref 70–99)
Potassium: 4.8 mEq/L (ref 3.5–5.1)
Sodium: 135 mEq/L (ref 135–145)

## 2012-12-22 ENCOUNTER — Encounter: Payer: Self-pay | Admitting: Nurse Practitioner

## 2012-12-24 ENCOUNTER — Telehealth: Payer: Self-pay | Admitting: Nurse Practitioner

## 2012-12-24 NOTE — Telephone Encounter (Signed)
Walk in pt Form " Application for Disability Pla-Card" Dropped  Off gave to Lori/Danielle  12/24/12/KM

## 2012-12-25 ENCOUNTER — Telehealth: Payer: Self-pay

## 2012-12-25 NOTE — Telephone Encounter (Signed)
Received a handicap parking form,Dr.Jordan signed form.Form mailed to patient.

## 2013-01-04 ENCOUNTER — Other Ambulatory Visit: Payer: Self-pay

## 2013-01-08 ENCOUNTER — Other Ambulatory Visit: Payer: Medicare Other

## 2013-01-08 DIAGNOSIS — C9000 Multiple myeloma not having achieved remission: Secondary | ICD-10-CM

## 2013-01-08 LAB — PROTEIN / CREATININE RATIO, URINE
Protein Creatinine Ratio: 0.12 (ref <0.15)
Total Protein, Urine: 23 mg/dL

## 2013-01-08 LAB — COMPREHENSIVE METABOLIC PANEL (CC13)
Alkaline Phosphatase: 44 U/L (ref 40–150)
BUN: 40.2 mg/dL — ABNORMAL HIGH (ref 7.0–26.0)
Glucose: 125 mg/dl — ABNORMAL HIGH (ref 70–99)
Total Bilirubin: 0.46 mg/dL (ref 0.20–1.20)

## 2013-01-08 LAB — CBC WITH DIFFERENTIAL/PLATELET
BASO%: 0.4 % (ref 0.0–2.0)
LYMPH%: 10.9 % — ABNORMAL LOW (ref 14.0–49.0)
MCHC: 34.5 g/dL (ref 32.0–36.0)
MONO#: 0.7 10*3/uL (ref 0.1–0.9)
MONO%: 8.8 % (ref 0.0–14.0)
Platelets: 133 10*3/uL — ABNORMAL LOW (ref 140–400)
RBC: 3.18 10*6/uL — ABNORMAL LOW (ref 4.20–5.82)
WBC: 8.2 10*3/uL (ref 4.0–10.3)

## 2013-01-09 ENCOUNTER — Other Ambulatory Visit: Payer: Self-pay | Admitting: Physician Assistant

## 2013-01-09 ENCOUNTER — Other Ambulatory Visit: Payer: Self-pay | Admitting: Cardiology

## 2013-01-09 LAB — KAPPA/LAMBDA LIGHT CHAINS
Kappa:Lambda Ratio: 8.84 — ABNORMAL HIGH (ref 0.26–1.65)
Lambda Free Lght Chn: 1.81 mg/dL (ref 0.57–2.63)

## 2013-01-13 ENCOUNTER — Encounter: Payer: Self-pay | Admitting: Cardiology

## 2013-01-16 ENCOUNTER — Other Ambulatory Visit: Payer: Self-pay | Admitting: Physician Assistant

## 2013-01-16 DIAGNOSIS — G8929 Other chronic pain: Secondary | ICD-10-CM

## 2013-01-16 DIAGNOSIS — C9000 Multiple myeloma not having achieved remission: Secondary | ICD-10-CM

## 2013-01-16 DIAGNOSIS — M549 Dorsalgia, unspecified: Secondary | ICD-10-CM

## 2013-01-22 ENCOUNTER — Telehealth: Payer: Self-pay | Admitting: Oncology

## 2013-01-22 NOTE — Telephone Encounter (Signed)
S/W DIANA FROM DR Wynn Banker OFFICE AT [facility] AND SHE STATED THAT SINCE THEY ARE ON THE EPIC SYSTEM THEY ARE ABLE TO VIEW THE REFERRAL AND DR Doroteo Bradford WILL REVIEW THE CASE AND THE PT WILL BE CONTACTED WITH AN APPT. PHONE NUMBER TO THE OFFICE IS [phone].

## 2013-01-27 ENCOUNTER — Encounter: Payer: Self-pay | Admitting: Physical Medicine & Rehabilitation

## 2013-02-05 ENCOUNTER — Other Ambulatory Visit (HOSPITAL_BASED_OUTPATIENT_CLINIC_OR_DEPARTMENT_OTHER): Payer: Medicare Other

## 2013-02-05 DIAGNOSIS — C9000 Multiple myeloma not having achieved remission: Secondary | ICD-10-CM

## 2013-02-05 DIAGNOSIS — D649 Anemia, unspecified: Secondary | ICD-10-CM

## 2013-02-05 LAB — PROTEIN / CREATININE RATIO, URINE
Creatinine, Urine: 143.1 mg/dL
Protein Creatinine Ratio: 0.07 (ref <0.15)
Total Protein, Urine: 10 mg/dL

## 2013-02-05 LAB — CBC WITH DIFFERENTIAL/PLATELET
BASO%: 1.1 % (ref 0.0–2.0)
EOS%: 2.8 % (ref 0.0–7.0)
HCT: 29.1 % — ABNORMAL LOW (ref 38.4–49.9)
MCH: 32.2 pg (ref 27.2–33.4)
MCHC: 34.3 g/dL (ref 32.0–36.0)
MCV: 93.7 fL (ref 79.3–98.0)
MONO%: 12.7 % (ref 0.0–14.0)
NEUT%: 57.9 % (ref 39.0–75.0)
RDW: 16.7 % — ABNORMAL HIGH (ref 11.0–14.6)
lymph#: 1.5 10*3/uL (ref 0.9–3.3)

## 2013-02-05 LAB — COMPREHENSIVE METABOLIC PANEL (CC13)
ALT: 19 U/L (ref 0–55)
AST: 20 U/L (ref 5–34)
Alkaline Phosphatase: 48 U/L (ref 40–150)
Calcium: 8.9 mg/dL (ref 8.4–10.4)
Chloride: 108 mEq/L — ABNORMAL HIGH (ref 98–107)
Creatinine: 1.5 mg/dL — ABNORMAL HIGH (ref 0.7–1.3)
Total Bilirubin: 0.41 mg/dL (ref 0.20–1.20)

## 2013-02-06 LAB — KAPPA/LAMBDA LIGHT CHAINS: Kappa:Lambda Ratio: 5.83 — ABNORMAL HIGH (ref 0.26–1.65)

## 2013-02-12 ENCOUNTER — Ambulatory Visit (INDEPENDENT_AMBULATORY_CARE_PROVIDER_SITE_OTHER): Payer: Medicare Other | Admitting: Nurse Practitioner

## 2013-02-12 ENCOUNTER — Encounter: Payer: Self-pay | Admitting: Nurse Practitioner

## 2013-02-12 VITALS — BP 118/68 | HR 48 | Ht 70.0 in | Wt 176.8 lb

## 2013-02-12 DIAGNOSIS — I255 Ischemic cardiomyopathy: Secondary | ICD-10-CM

## 2013-02-12 DIAGNOSIS — I2589 Other forms of chronic ischemic heart disease: Secondary | ICD-10-CM

## 2013-02-12 NOTE — Patient Instructions (Addendum)
I think you are doing well.  Keep active!!!  Stay on your current medicines  Dr. Swaziland will see you in 6 months  Call the [facility] office at [phone] if you have any questions, problems or concerns.

## 2013-02-12 NOTE — Progress Notes (Signed)
[name] Date of Birth: 1928/07/02 Medical Record #[id number]  History of Present Illness: Timothy [name] is seen back today for a 2 month check. He is seen for Dr. [place]. He has a complex medical history which includes an ischemic CM with an EF of 30% per echo in April of 2013. He has multiple myeloma and recent toe amputations for osteomyelitis. Followed closely by oncology. Other problems include HLD, depression, remote CABG in 2002, CKD, neuropathy, anemia and gout.   I saw him back in January. Was not doing that well. Got a Myoview which turned out ok.   He comes back today. He is here alone. Doing ok from a cardiac standpoint. No chest pain. Not short of breath. No swelling. No PND/orthopnea. Not dizzy or lightheaded. BP has been doing ok. He is primarily limited by his back. Has significant pain and neuropathy. Going to the pain clinic for evaluation. Has gone back to the gym more regularly. Overall, seems to be holding his own.   Current Outpatient Prescriptions on File Prior to Visit  Medication Sig Dispense Refill  . allopurinol (ZYLOPRIM) 300 MG tablet 150 mg daily.       Marland Kitchen aspirin 81 MG tablet Take 1 tablet (81 mg total) by mouth every other day.  30 tablet  0  . atorvastatin (LIPITOR) 40 MG tablet Take 0.5 tablets (20 mg total) by mouth daily.  45 tablet  3  . B Complex-Biotin-FA (B COMPLETE PO) Take 0.5 tablets by mouth 2 (two) times daily.       . carvedilol (COREG) 6.25 MG tablet Take 1 tablet (6.25 mg total) by mouth 2 (two) times daily.  180 tablet  3  . Coenzyme Q10 (CO Q-10 PO) Take 1 tablet by mouth daily.       Marland Kitchen HYDROmorphone (DILAUDID) 4 MG tablet Take 1 tablet (4 mg total) by mouth every 4 (four) hours as needed.  90 tablet  0  . lisinopril (PRINIVIL,ZESTRIL) 20 MG tablet TAKE 1 TABLET DAILY  90 tablet  2  . nitroGLYCERIN (NITROSTAT) 0.4 MG SL tablet Place 1 tablet (0.4 mg total) under the tongue every 5 (five) minutes as needed for chest pain.  25 tablet  3  .  Tamsulosin HCl (FLOMAX) 0.4 MG CAPS Take 1 capsule (0.4 mg total) by mouth daily.  90 capsule  1  . HYDROmorphone (DILAUDID) 4 MG tablet Take 1 tablet (4 mg total) by mouth every 4 (four) hours as needed.  90 tablet  0   No current facility-administered medications on file prior to visit.    Allergies  Allergen Reactions  . Morphine And Related Other (See Comments)    Could not seat still    Past Medical History  Diagnosis Date  . ASCVD (arteriosclerotic cardiovascular disease)   . Anemia     chronic mild anemia  . Gout   . Hypertension   . Lumbar disc disease     post laminectomy  . Chronic back pain   . History of echocardiogram 11/02/2010    EF range of 30 to 35% / There is hypokinsesis of the basal-mild inferolateral myocardium  . CHF (congestive heart failure)   . Heart murmur   . Ischemic cardiomyopathy   . Renal insufficiency   . Peripheral neuropathy   . Inferior myocardial infarction 1986  . Hyperlipidemia   . SOB (shortness of breath)   . Fatigue   . Peripheral neuropathy   . Coronary artery disease  CABG 2002 by [name] with LIMA to LAD, SVG to intermediate, SVG to LCX, & SVG to PL. Last nuclear in 2012 showing large inferior scar with EF of 39%.   . Blood transfusion     DEC 2012 - TWO UNITS  . 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  PT HAS BEEN OFF CHEMO SINCE HIS HOSPITALIZATION FEB 2013 FOR LEFT FOOT INFECTION  . Cancer   . History of shingles MARCH 2009    SHINGLE LESIONS WERE AROUND RIGHT EYE--PT HAS RESIDUAL ITCHING AROUND THE EYE.  . Osteomyelitis     s/p toe amputation  . Chronic systolic heart failure     EF of 30% per echo 02/2012  . Protein malnutrition     Past Surgical History  Procedure Laterality Date  . Laminectomy    . Inguinal hernia repair    . Cardiac catheterization  06/20/2000    Severe coronary disease (totally occluded right artery, 90% left circumflex, 50-60%  intermediate, and 90-95% ostial left anterior descending)   . Tonsillectomy    . Coronary artery bypass graft  2002    x4 / Left internal mammary artery to the LAD.  / Saphenous vein graft to the right posterolateral.  /  Saphenous vein graft to  the ramus intermedius. / Saphenous vein graft to the circumflex marginal     . Amputation  03/06/2012    Procedure: AMPUTATION RAY;  Surgeon: Harvie Junior, MD;  Location: [facility];  Service: Orthopedics;  Laterality: Left;  First Ray Amputation    History  Smoking status  . Former Smoker -- 1.00 packs/day for 20 years  . Types: Cigarettes  . Quit date: 11/20/1984  Smokeless tobacco  . Never Used    History  Alcohol Use No    History reviewed. No pertinent family history.  Review of Systems: The review of systems is per the HPI.  All other systems were reviewed and are negative.  Physical Exam: BP 118/68  Pulse 48  Ht 5\' 10"  (1.778 m)  Wt 176 lb 12.8 oz (80.196 kg)  BMI 25.37 kg/m2 Patient is very pleasant and in no acute distress. Skin is warm and dry. Color is normal.  HEENT is unremarkable. Normocephalic/atraumatic. PERRL. Sclera are nonicteric. Neck is supple. No masses. No JVD. Lungs are clear. Cardiac exam shows a regular rate and rhythm. Abdomen is soft. Extremities are without edema. Gait and ROM are intact. No gross neurologic deficits noted.  LABORATORY DATA:  Lab Results  Component Value Date   WBC 5.8 02/05/2013   HGB 10.0* 02/05/2013   HCT 29.1* 02/05/2013   PLT 146 02/05/2013   GLUCOSE 93 02/05/2013   CHOL 94 10/02/2012   TRIG 68 10/02/2012   HDL 42 10/02/2012   LDLCALC 38 10/02/2012   ALT 19 02/05/2013   AST 20 02/05/2013   NA 137 02/05/2013   K 4.6 02/05/2013   CL 108* 02/05/2013   CREATININE 1.5* 02/05/2013   BUN 42.6* 02/05/2013   CO2 22 02/05/2013   TSH 1.84 03/20/2012   PSA 3.75 05/30/2006   INR 1.10 03/01/2012   Myoview Impression  Exercise Capacity: Lexiscan with no exercise.  BP Response: Normal blood pressure  response.  Clinical Symptoms: Dizziness, nausea and chest pain.  ECG Impression: No significant ECG changes with Lexiscan. Pattern of old DMI unchanged.  Comparison with Prior Nuclear Study: No images to compare   Overall Impression: Intermediate stress nuclear study.  There is a medium size scar involving the basal inferior and basal inferolateral segments. There is minimal peri-infarct ischemia (SDS 4). There is moderate LV systolic dysfunction. EF has increased since last Myoview from 39% to 41%.  LV Ejection Fraction: 41%. LV Wall Motion: The LV ejection fraction is 41% with inferior wall hypokinesis.   Thomas Brackbill  Assessment / Plan: 1. Ischemic CM - managed medically - no chest pain reported and currently looks compensated. No change in his medicines. He would like to come back in 6 months.   2. Multiple myeloma - followed by oncology with routine labs  3. HLD - on statin therapy  4. Bradycardia - totally asymptomatic. No change in his medicines.   See him back in 6 months.   Patient is agreeable to this plan and will call if any problems develop in the interim.   Rosalio Macadamia, RN, ANP-C [facility] [address] [place], [place]  [address]

## 2013-02-14 ENCOUNTER — Encounter: Payer: Self-pay | Admitting: Physical Medicine & Rehabilitation

## 2013-02-14 ENCOUNTER — Telehealth: Payer: Self-pay

## 2013-02-14 ENCOUNTER — Encounter: Payer: Medicare Other | Attending: Physical Medicine & Rehabilitation

## 2013-02-14 ENCOUNTER — Ambulatory Visit (HOSPITAL_BASED_OUTPATIENT_CLINIC_OR_DEPARTMENT_OTHER): Payer: Medicare Other | Admitting: Physical Medicine & Rehabilitation

## 2013-02-14 VITALS — BP 121/58 | HR 55 | Resp 14 | Ht 70.0 in | Wt 176.0 lb

## 2013-02-14 DIAGNOSIS — Z951 Presence of aortocoronary bypass graft: Secondary | ICD-10-CM | POA: Insufficient documentation

## 2013-02-14 DIAGNOSIS — E785 Hyperlipidemia, unspecified: Secondary | ICD-10-CM | POA: Insufficient documentation

## 2013-02-14 DIAGNOSIS — I252 Old myocardial infarction: Secondary | ICD-10-CM | POA: Insufficient documentation

## 2013-02-14 DIAGNOSIS — G609 Hereditary and idiopathic neuropathy, unspecified: Secondary | ICD-10-CM | POA: Insufficient documentation

## 2013-02-14 DIAGNOSIS — C9000 Multiple myeloma not having achieved remission: Secondary | ICD-10-CM | POA: Insufficient documentation

## 2013-02-14 DIAGNOSIS — M79609 Pain in unspecified limb: Secondary | ICD-10-CM | POA: Insufficient documentation

## 2013-02-14 DIAGNOSIS — M48061 Spinal stenosis, lumbar region without neurogenic claudication: Secondary | ICD-10-CM

## 2013-02-14 DIAGNOSIS — M545 Low back pain, unspecified: Secondary | ICD-10-CM | POA: Insufficient documentation

## 2013-02-14 DIAGNOSIS — I10 Essential (primary) hypertension: Secondary | ICD-10-CM | POA: Insufficient documentation

## 2013-02-14 DIAGNOSIS — I2510 Atherosclerotic heart disease of native coronary artery without angina pectoris: Secondary | ICD-10-CM | POA: Insufficient documentation

## 2013-02-14 MED ORDER — DULOXETINE HCL 20 MG PO CPEP: 20.0000 mg | ORAL_CAPSULE | Freq: Every day | ORAL | Status: DC

## 2013-02-14 NOTE — Progress Notes (Signed)
Subjective:    Patient ID: Timothy [name], male    DOB: 02-Sep-1928, 79 y.o.   MRN: [id number]  HPI Multiple myeloma diagnosed in 2007.  Chemotherapy approximately one year ago. Has had burning pain in the leg since that time. Also history of low back pain. Had lumbar disc surgery 1986. Was functioning well postoperatively. Had some intermittent exacerbations. Saw Dr. Marikay Alar neurosurgery approximately 6 months ago. No surgical intervention was recommended. Was recommended to try some lumbar facet procedures. These were performed by Dr. Modesto Charon. A radiofrequency procedure was then performed but the patient got no relief from this procedure. No physical therapy. Has been working out on his own in the gym. Using exercise machine in the gym to work on strengthening upper and lower body. Using free weights for biceps. Walking for extended period time causes left-sided buttock pain radiating into the lateral thigh. No pain with sitting   Pain Inventory Average Pain 6 Pain Right Now 2 My pain is dull and aching  In the last 24 hours, has pain interfered with the following? General activity 6 Relation with others 6 Enjoyment of life 6 What TIME of day is your pain at its worst? daytime Sleep (in general) NA  Pain is worse with: walking and standing Pain improves with: rest and medication Relief from Meds: 6  Mobility walk without assistance how many minutes can you walk? 10-15 do you drive?  yes  Function retired  Neuro/Psych weakness tremor trouble walking  Prior Studies Any changes since last visit?  yes CT/MRI *RADIOLOGY REPORT*  Clinical Data: Chronic progressive low back and left buttock pain.  History of ruptured disc in 1986. History of multiple myeloma with  chemotherapy.  CT LUMBAR SPINE WITHOUT CONTRAST  Technique: Multidetector CT imaging of the lumbar spine was  performed without intravenous contrast administration. Multiplanar  CT image reconstructions were  also generated.  Comparison: Lumbar MRI 06/06/2012 and 07/13/2010.  Findings: There are five lumbar type vertebral bodies. A convex  left scoliosis centered at L2-L3 is stable. There is stable  multilevel disc space loss and endplate degeneration. No acute  fracture or pars defect is seen.  No paraspinal abnormalities are seen. There are diffuse vascular  calcifications.  L1-L2: Stable disc bulging with anterior osteophytes and mild  facet hypertrophy. No significant spinal stenosis or nerve root  encroachment.  L2-L3: There is severe chronic loss of disc height with paraspinal  osteophytes asymmetric to the right. There is extradural air  within the right lateral recess consistent with an extruded disc  fragment with vacuum phenomenon. This appears unchanged from the  prior MRI. There is stable asymmetric facet hypertrophy on the  right. Mild central, right lateral recess and right foraminal  stenosis is unchanged.  L3-L4: There is severe chronic loss of disc height with paraspinal  osteophytes asymmetric to the right. There is a small amount of  extradural air within the right lateral recess and right foramen.  There is bilateral facet and ligamentous hypertrophy. These  factors contribute to stable mild central and lateral recess  stenosis bilaterally. There is moderate right foraminal stenosis  which appears unchanged.  L4-L5: There are stable postsurgical change on the right  consistent with laminectomy and partial facetectomy. The facet  joints are likely fused. Posterior osteophytes and facet  hypertrophy contribute to mild foraminal stenosis, left greater  than right.  L5-S1: Stable mild disc bulging and bilateral facet hypertrophy.  The disc height is relatively maintained. There is no significant  osseous foraminal stenosis. Better seen on the prior MRI is a  small synovial cyst extending anteriorly from the left facet joint.  This could encroach on the left L5 nerve root.  There has been no  gross change.  IMPRESSION:  1. No acute findings or definite changes to account for increased  left radicular symptoms. There is chronic left foraminal stenosis  at L4-L5 secondary to osteophytes and at L5-S1 secondary to a small  synovial cyst extending anteriorly from the facet joint, better  seen on prior MRI.  2. Stable central and lateral recess stenosis bilaterally at L2-  L3 and L3-L4. At both levels, there are right lateral recess disc  protrusions with vacuum phenomenon.  Physicians involved in your care Any changes since last visit?  no   History reviewed. No pertinent family history. History   Social History  . Marital Status: Married    Spouse Name: N/A    Number of Children: N/A  . Years of Education: N/A   Social History Main Topics  . Smoking status: Former Smoker -- 1.00 packs/day for 20 years    Types: Cigarettes    Quit date: 11/20/1984  . Smokeless tobacco: Never Used  . Alcohol Use: No  . Drug Use: No  . Sexually Active: Not Currently   Other Topics Concern  . None   Social History Narrative   Lives at home with wife, has children but not with current wife. Still active, golfing and walking in the yard before the issue with the wound, now using a walker.    Past Surgical History  Procedure Laterality Date  . Laminectomy    . Inguinal hernia repair    . Cardiac catheterization  06/20/2000    Severe coronary disease (totally occluded right artery, 90% left circumflex, 50-60% intermediate, and 90-95% ostial left anterior descending)   . Tonsillectomy    . Coronary artery bypass graft  2002    x4 / Left internal mammary artery to the LAD.  / Saphenous vein graft to the right posterolateral.  /  Saphenous vein graft to  the ramus intermedius. / Saphenous vein graft to the circumflex marginal     . Amputation  03/06/2012    Procedure: AMPUTATION RAY;  Surgeon: Harvie Junior, MD;  Location: [facility];  Service: Orthopedics;  Laterality:  Left;  First Ray Amputation   Past Medical History  Diagnosis Date  . ASCVD (arteriosclerotic cardiovascular disease)   . Anemia     chronic mild anemia  . Gout   . Hypertension   . Lumbar disc disease     post laminectomy  . Chronic back pain   . History of echocardiogram 11/02/2010    EF range of 30 to 35% / There is hypokinsesis of the basal-mild inferolateral myocardium  . CHF (congestive heart failure)   . Heart murmur   . Ischemic cardiomyopathy   . Renal insufficiency   . Peripheral neuropathy   . Inferior myocardial infarction 1986  . Hyperlipidemia   . SOB (shortness of breath)   . Fatigue   . Peripheral neuropathy   . Coronary artery disease     CABG 2002 by [name] with LIMA to LAD, SVG to intermediate, SVG to LCX, & SVG to PL. Last nuclear in 2012 showing large inferior scar with EF of 39%.   . Blood transfusion     DEC 2012 - TWO UNITS  . Myeloma     Multiple myeloma, with recurrence of increasing problems  DR. Arlice Colt -ONCOLOGIST.  PT HAS BEEN OFF CHEMO SINCE HIS HOSPITALIZATION FEB 2013 FOR LEFT FOOT INFECTION  . Cancer   . History of shingles MARCH 2009    SHINGLE LESIONS WERE AROUND RIGHT EYE--PT HAS RESIDUAL ITCHING AROUND THE EYE.  . Osteomyelitis     s/p toe amputation  . Chronic systolic heart failure     EF of 30% per echo 02/2012  . Protein malnutrition    BP 121/58  Pulse 55  Resp 14  Ht 5\' 10"  (1.778 m)  Wt 176 lb (79.833 kg)  BMI 25.25 kg/m2  SpO2 97%   Review of Systems  Respiratory: Positive for shortness of breath.   Musculoskeletal: Positive for gait problem.  Neurological: Positive for tremors and weakness.  All other systems reviewed and are negative.       Objective:   Physical Exam  Nursing note and vitals reviewed. Constitutional: He is oriented to person, place, and time. He appears well-developed and well-nourished.  HENT:  Head: Normocephalic and atraumatic.  Right Ear: External ear normal.  Left Ear: External ear  normal.  Mouth/Throat: Oropharynx is clear and moist.  Eyes: Conjunctivae and EOM are normal. Pupils are equal, round, and reactive to light.  Neck: Normal range of motion. Neck supple.  Musculoskeletal:       Right hip: He exhibits decreased range of motion. He exhibits no tenderness.       Left hip: He exhibits decreased range of motion. He exhibits no tenderness and no bony tenderness.       Lumbar back: He exhibits decreased range of motion and deformity. He exhibits no tenderness and no bony tenderness.       Back:       Right foot: He exhibits deformity.       Left foot: He exhibits deformity.  Convex left lumbar scoliosis mild Mildly reduced hip internal/external rotation without pain Normal  flexion lumbar spine Limited lumbar extension lateral bending and twisting  Bilateral foot intrinsic atrophy  Neurological: He is alert and oriented to person, place, and time. He has normal strength and normal reflexes. He displays atrophy. A sensory deficit is present. Gait abnormal.  Decreased sensation to pinprick below the ankles bilaterally. No sensory changes in the hands. There is foot intrinsic atrophy bilateral  Psychiatric: He has a normal mood and affect.          Assessment & Plan:  1. Lumbar spinal stenosis without evidence of neurogenic claudication except potentially in the left side. Most recent CT scan shows no stenotic lesions to really explain this other than a mild foraminal stenosis L4-5 on the left side. He has more moderate stenosis on the right side but no lower extremity symptoms. He does have lumbar facet arthropathy which radiologically looks worse in the upper lumbar area however symptomatically he is most this pain at the lumbosacral junction.  We discussed the need for core strengthening program and I will refer him to physical therapy We discussed other spinal injections such as sacroiliac injection the left side or left L4-L5, L5-S1 facet medial branch  blocks.  2. Peripheral neuropathy pain. He has been unable to tolerate either Lyrica or Neurontin. Will trial Cymbalta 20 mg dose may need to work up to 30 or even 60 mg per day. Return to clinic one month  In terms of narcotic analgesic medications he gets occasional Hydromorphone from his oncologist. I will not prescribe narcotic analgesics

## 2013-02-14 NOTE — Telephone Encounter (Signed)
LMOVM regarding lab results for 3/19. Per AB/KM labs stable. Increase fluid intake by 2 quarts/day. Call office with any questions. TMB

## 2013-02-14 NOTE — Patient Instructions (Addendum)
Trial of Cymbalta 20 mg a day. Will likely need to go up to 30 mg if he tolerated well Physical therapy at [facility] office We discussed doing an additional injections lumbar facet at L45 and L5-S1 We discussed sacroiliac injections

## 2013-02-25 ENCOUNTER — Telehealth: Payer: Self-pay

## 2013-02-25 NOTE — Telephone Encounter (Signed)
Patients wife called for him with questions about having an injection.

## 2013-02-26 NOTE — Telephone Encounter (Signed)
Dr [name] would like you to give him a call.  He exercises himself and has not heard rehab and doesn't really think that would do him much good.  He wants to talk more with you about the injections.  He has an appointment in 2 weeks but would like a call.

## 2013-02-26 NOTE — Telephone Encounter (Signed)
Patient would like a call.

## 2013-02-27 NOTE — Telephone Encounter (Signed)
Set up for L SI injection ASAP (after Riley Kill returns)

## 2013-03-04 ENCOUNTER — Ambulatory Visit: Payer: Medicare Other | Attending: Physical Medicine & Rehabilitation | Admitting: Physical Therapy

## 2013-03-04 DIAGNOSIS — G8929 Other chronic pain: Secondary | ICD-10-CM | POA: Insufficient documentation

## 2013-03-04 DIAGNOSIS — M545 Low back pain, unspecified: Secondary | ICD-10-CM | POA: Insufficient documentation

## 2013-03-04 DIAGNOSIS — IMO0001 Reserved for inherently not codable concepts without codable children: Secondary | ICD-10-CM | POA: Insufficient documentation

## 2013-03-04 DIAGNOSIS — Z9221 Personal history of antineoplastic chemotherapy: Secondary | ICD-10-CM | POA: Insufficient documentation

## 2013-03-05 ENCOUNTER — Other Ambulatory Visit (HOSPITAL_BASED_OUTPATIENT_CLINIC_OR_DEPARTMENT_OTHER): Payer: Medicare Other

## 2013-03-05 DIAGNOSIS — D649 Anemia, unspecified: Secondary | ICD-10-CM

## 2013-03-05 DIAGNOSIS — C9000 Multiple myeloma not having achieved remission: Secondary | ICD-10-CM

## 2013-03-05 LAB — COMPREHENSIVE METABOLIC PANEL (CC13)
Albumin: 3.4 g/dL — ABNORMAL LOW (ref 3.5–5.0)
Alkaline Phosphatase: 48 U/L (ref 40–150)
CO2: 22 mEq/L (ref 22–29)
Calcium: 9 mg/dL (ref 8.4–10.4)
Chloride: 97 mEq/L — ABNORMAL LOW (ref 98–107)
Glucose: 95 mg/dl (ref 70–99)
Potassium: 4.6 mEq/L (ref 3.5–5.1)
Sodium: 126 mEq/L — ABNORMAL LOW (ref 136–145)
Total Protein: 6.4 g/dL (ref 6.4–8.3)

## 2013-03-05 LAB — CBC WITH DIFFERENTIAL/PLATELET
BASO%: 0.3 % (ref 0.0–2.0)
EOS%: 1.3 % (ref 0.0–7.0)
HCT: 28.7 % — ABNORMAL LOW (ref 38.4–49.9)
LYMPH%: 23.7 % (ref 14.0–49.0)
MCH: 30.4 pg (ref 27.2–33.4)
MCHC: 32.7 g/dL (ref 32.0–36.0)
MONO%: 13.1 % (ref 0.0–14.0)
NEUT%: 61.6 % (ref 39.0–75.0)
Platelets: 160 10*3/uL (ref 140–400)
RBC: 3.09 10*6/uL — ABNORMAL LOW (ref 4.20–5.82)

## 2013-03-06 LAB — KAPPA/LAMBDA LIGHT CHAINS: Kappa free light chain: 16 mg/dL — ABNORMAL HIGH (ref 0.33–1.94)

## 2013-03-10 ENCOUNTER — Encounter: Payer: Self-pay | Admitting: Physical Therapy

## 2013-03-11 ENCOUNTER — Ambulatory Visit: Payer: Medicare Other | Admitting: Physical Therapy

## 2013-03-12 ENCOUNTER — Ambulatory Visit (HOSPITAL_BASED_OUTPATIENT_CLINIC_OR_DEPARTMENT_OTHER): Payer: Medicare Other | Admitting: Physician Assistant

## 2013-03-12 ENCOUNTER — Encounter: Payer: Self-pay | Admitting: Physician Assistant

## 2013-03-12 ENCOUNTER — Telehealth: Payer: Self-pay | Admitting: Oncology

## 2013-03-12 VITALS — BP 126/66 | HR 66 | Temp 98.7°F | Resp 20 | Ht 70.0 in | Wt 181.4 lb

## 2013-03-12 DIAGNOSIS — N289 Disorder of kidney and ureter, unspecified: Secondary | ICD-10-CM

## 2013-03-12 DIAGNOSIS — D649 Anemia, unspecified: Secondary | ICD-10-CM

## 2013-03-12 DIAGNOSIS — C9000 Multiple myeloma not having achieved remission: Secondary | ICD-10-CM

## 2013-03-12 MED ORDER — HYDROMORPHONE HCL 4 MG PO TABS: 4.0000 mg | ORAL_TABLET | ORAL | Status: DC | PRN

## 2013-03-12 NOTE — Progress Notes (Signed)
ID: Timothy [name]   DOB: [birth year]  MR#: [id number]  QMV#:[id number]  PCP: [facility]: OTHER MD: Jodi Geralds  HISTORY OF PRESENT ILLNESS: Patient presented in April 2007 with symptomatic slowly progressive anemia, with normal iron parameters, folic acid, and vitamin B12. Plans were made to obtain bone marrow aspirate and biopsy as an outpatient, however the patient was hospitalized shortly thereafter with a creatinine of 8.6. Renal biopsy demonstrated the presence of  light chain deposition disease. Bone marrow biopsy demonstrated 37% plasma cells. A skeletal survey showed small calvarial lytic lesions, mild osteopenia, and cervical spondylosis. With a well-established diagnosis of light-chain multiple myeloma [name] subsequent treatments are as detailed below.   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 The interval history is remarkable for the fact that he has been evaluated by Dr. Wynn Banker for his chronic back pain. He anticipates receiving an injection in the sacroiliac region on 03/27/2013 which is hoping will help with the pain. In the meanwhile, he continues to take a lot of, averaging two 4 mg tablets daily. He still very limited in his activities, and can currently not garden or play golf.   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 His energy level is fair. He denies any abnormal bruising or bleeding. He is eating and drinking well denies any nausea, abdominal pain, or change in bowel or bladder habits. Calcium denies any increased shortness of breath and has had no chest pain or palpitations. He's had no abnormal headaches or dizziness.  A detailed review of systems is otherwise stable and noncontributory.   PAST MEDICAL HISTORY: Past Medical History  Diagnosis Date  . ASCVD (arteriosclerotic cardiovascular disease)   . Anemia     chronic mild anemia  . Gout   .  Hypertension   . Lumbar disc disease     post laminectomy  . Chronic back pain   . History of echocardiogram 11/02/2010    EF range of 30 to 35% / There is hypokinsesis of the basal-mild inferolateral myocardium  . CHF (congestive heart failure)   . Heart murmur   . Ischemic cardiomyopathy   . Renal insufficiency   . Peripheral neuropathy   . Inferior myocardial infarction 1986  . Hyperlipidemia   . SOB (shortness of breath)   . Fatigue   . Peripheral neuropathy   . Coronary artery disease     CABG 2002 by [name] with LIMA to LAD, SVG to intermediate, SVG to LCX, & SVG to PL. Last nuclear in 2012 showing large inferior scar with EF of 39%.   . Blood transfusion     DEC 2012 - TWO UNITS  . 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  PT HAS BEEN OFF CHEMO SINCE HIS HOSPITALIZATION FEB 2013 FOR LEFT FOOT INFECTION  . Cancer   . History of shingles MARCH 2009    SHINGLE LESIONS WERE AROUND RIGHT EYE--PT HAS RESIDUAL ITCHING AROUND THE EYE.  . Osteomyelitis     s/p toe amputation  . Chronic systolic heart failure     EF of 30% per echo 02/2012  . Protein malnutrition    PAST SURGICAL HISTORY: Past Surgical History  Procedure Laterality Date  . Laminectomy    . Inguinal hernia repair    . Cardiac catheterization  06/20/2000    Severe coronary disease (totally occluded  right artery, 90% left circumflex, 50-60% intermediate, and 90-95% ostial left anterior descending)   . Tonsillectomy    . Coronary artery bypass graft  2002    x4 / Left internal mammary artery to the LAD.  / Saphenous vein graft to the right posterolateral.  /  Saphenous vein graft to  the ramus intermedius. / Saphenous vein graft to the circumflex marginal     . Amputation  03/06/2012    Procedure: AMPUTATION RAY;  Surgeon: Harvie Junior, MD;  Location: [facility];  Service: Orthopedics;  Laterality: Left;  First Ray Amputation    FAMILY HISTORY No family  history of hematologic malignancies; brother had prostate cancer; no other cancers in the immediate family  SOCIAL HISTORY: Retired International aid/development worker; children from prior marriage   ADVANCED DIRECTIVES:  HEALTH MAINTENANCE: History  Substance Use Topics  . Smoking status: Former Smoker -- 1.00 packs/day for 20 years    Types: Cigarettes    Quit date: 11/20/1984  . Smokeless tobacco: Never Used  . Alcohol Use: No     Colonoscopy:  Bone density:  Lipid panel:  Allergies  Allergen Reactions  . Morphine And Related Other (See Comments)    Could not seat still    Current Outpatient Prescriptions  Medication Sig Dispense Refill  . allopurinol (ZYLOPRIM) 300 MG tablet 150 mg daily.       Marland Kitchen aspirin 81 MG tablet Take 1 tablet (81 mg total) by mouth every other day.  30 tablet  0  . atorvastatin (LIPITOR) 40 MG tablet Take 0.5 tablets (20 mg total) by mouth daily.  45 tablet  3  . B Complex-Biotin-FA (B COMPLETE PO) Take 0.5 tablets by mouth 2 (two) times daily.       . carvedilol (COREG) 6.25 MG tablet Take 1 tablet (6.25 mg total) by mouth 2 (two) times daily.  180 tablet  3  . Coenzyme Q10 (CO Q-10 PO) Take 1 tablet by mouth daily.       . DULoxetine (CYMBALTA) 20 MG capsule Take 1 capsule (20 mg total) by mouth daily.  30 capsule  3  . HYDROmorphone (DILAUDID) 4 MG tablet Take 1 tablet (4 mg total) by mouth every 4 (four) hours as needed.  90 tablet  0  . lisinopril (PRINIVIL,ZESTRIL) 20 MG tablet TAKE 1 TABLET DAILY  90 tablet  2  . Multiple Vitamin (MULTIVITAMIN) capsule Take 1 capsule by mouth daily.      . nitroGLYCERIN (NITROSTAT) 0.4 MG SL tablet Place 1 tablet (0.4 mg total) under the tongue every 5 (five) minutes as needed for chest pain.  25 tablet  3  . Tamsulosin HCl (FLOMAX) 0.4 MG CAPS Take 1 capsule (0.4 mg total) by mouth daily.  90 capsule  1   No current facility-administered medications for this visit.    OBJECTIVE: Elderly white male in no acute distress Filed  Vitals:   03/12/13 1341  BP: 126/66  Pulse: 66  Temp: 98.7 F (37.1 C)  Resp: 20     Body mass index is 26.03 kg/(m^2).    ECOG FS: 1 Filed Weights   03/12/13 1341  Weight: 181 lb 6.4 oz (82.283 kg)   Sclerae unicteric Oropharynx clear No peripheral adenopathy Lungs clear to auscultation, no rales or rhonchi Heart regular rate and rhythm Abdomen  soft, nontender, positive bowel sounds, no masses palpated MSK no focal spinal tenderness to moderate palpation,  No significant peripheral edema; Neuro: nonfocal, well oriented, positive affect   LAB  RESULTS:   Results for WALTER, GRIMA (MRN [id number]) as of 12/10/2012 11:55  Ref. Range 08/01/2012 10:49 08/29/2012 11:13 09/25/2012 11:09 11/06/2012 10:51 12/03/2012 13:10  IgG (Immunoglobin G), Serum Latest Range: 3197155221 mg/dL    782   IgA Latest Range: 68-379 mg/dL    956   IgM, Serum Latest Range: 41-251 mg/dL    47   Kappa free light chain Latest Range: 0.33-1.94 mg/dL 21.30 (H) 86.57 (H) 84.69 (H) 16.40 (H) 14.10 (H)  Lambda Free Lght Chn Latest Range: 0.57-2.63 mg/dL 6.29 (H) 5.28 4.13 2.44 2.14  Kappa:Lambda Ratio Latest Range: 0.26-1.65  4.47 (H) 5.69 (H) 7.88 (H) 8.04 (H) 6.59 (H)    Lab Results  Component Value Date   WBC 5.7 03/05/2013   NEUTROABS 3.5 03/05/2013   HGB 9.4* 03/05/2013   HCT 28.7* 03/05/2013   MCV 92.9 03/05/2013   PLT 160 03/05/2013      Chemistry      Component Value Date/Time   NA 126* 03/05/2013 1018   NA 135 12/20/2012 1117   K 4.6 03/05/2013 1018   K 4.8 12/20/2012 1117   CL 97* 03/05/2013 1018   CL 106 12/20/2012 1117   CO2 22 03/05/2013 1018   CO2 23 12/20/2012 1117   BUN 24.1 03/05/2013 1018   BUN 49* 12/20/2012 1117   CREATININE 1.2 03/05/2013 1018   CREATININE 1.3 12/20/2012 1117      Component Value Date/Time   CALCIUM 9.0 03/05/2013 1018   CALCIUM 9.7 12/20/2012 1117   ALKPHOS 48 03/05/2013 1018   ALKPHOS 52 07/04/2012 1106   AST 26 03/05/2013 1018   AST 25 07/04/2012 1106   ALT 19 03/05/2013  1018   ALT 22 07/04/2012 1106   BILITOT 0.73 03/05/2013 1018   BILITOT 0.6 07/04/2012 1106      STUDIES: No results found.   ASSESSMENT: 79 year-old with kappa light chain multiple myeloma  (1) presenting April 2007 with anemia and renal failure; renal Bx showing free kappa light chain deposition; bone marrow biopsy showing 37% plasma cells, with normal cytogenetics and FISH;bone survey showing skull lytic lesions; treated with  (2) thalidomide (200 mg/d) and dexamethasone (40 mg/d x4d Q28d) June through Nov 2007, with good response, but poor tolerance;  (3) thalidomide decreased to 100 mg/d, dexamethasone continued, bortezomib (IV) added, Dec 2007 to March 2008  (4) treatment interrupted by multiple complications (peripheral neuropathy, pulmonary embolism, diverticular abscess, CN V zoster, severe DDD, congestive heart failure, rising PSA); maintenance zolendronic acid through March 2012  (5) progression April 2012, treated initially with dexamethasone alone, poorly tolerated;  (6) bortezomib (sq) resumed July 2012; cyclophosphamide and dexamethasone added Sept 2012; zolendronic acid changed to Q 3months; all treatments held as of March 2013 due to the development of the Left foot ulcer described above (7) anemia, on aranesp December 2012 to August 2013   PLAN:   This case and the recent labs were reviewed with Dr. Darnelle Catalan today, who is comfortable that [name] his myeloma appears to be stable. We will continue with our current plan, specifically checking labs on a monthly basis, and returning on a every 3 month basis for physical exam. We will continue to follow his anemia very closely. We may need to consider reinitiating Aranesp in the future. We have refilled his Dilaudid, and he'll continue to be followed by Dr. Wynn Banker as well for his chronic back pain.  [name] and his wife both voice understanding and agreement with this plan, and  will call with any changes or  problems.   Louanne Skye is doing well overall, with his chronic problems unchanged. I have refilled his Tylenol it, and he stable he receives 90 tablets every 28 days, which allows him to function at a semi-normal level and does not seem to cause him any side effects or complications. I do not see evidence of myeloma progression at present and I do not believe any of his symptoms are directly caused by the disease itself. Accordingly we are holding off on further treatment for now, and will continue to check his lab work on a monthly basis and see him on a every 3 month basis. He knows to call for any problems that may develop before the next visit. Meriam Chojnowski    03/12/2013

## 2013-03-14 ENCOUNTER — Encounter: Payer: Medicare Other | Attending: Physical Medicine & Rehabilitation

## 2013-03-14 ENCOUNTER — Ambulatory Visit (HOSPITAL_BASED_OUTPATIENT_CLINIC_OR_DEPARTMENT_OTHER): Payer: Medicare Other | Admitting: Physical Medicine & Rehabilitation

## 2013-03-14 ENCOUNTER — Encounter: Payer: Self-pay | Admitting: Physical Medicine & Rehabilitation

## 2013-03-14 VITALS — BP 117/59 | HR 62 | Resp 14 | Ht 70.0 in | Wt 181.2 lb

## 2013-03-14 DIAGNOSIS — IMO0002 Reserved for concepts with insufficient information to code with codable children: Secondary | ICD-10-CM

## 2013-03-14 DIAGNOSIS — M129 Arthropathy, unspecified: Secondary | ICD-10-CM | POA: Insufficient documentation

## 2013-03-14 DIAGNOSIS — G609 Hereditary and idiopathic neuropathy, unspecified: Secondary | ICD-10-CM | POA: Insufficient documentation

## 2013-03-14 DIAGNOSIS — M79609 Pain in unspecified limb: Secondary | ICD-10-CM | POA: Insufficient documentation

## 2013-03-14 DIAGNOSIS — M47817 Spondylosis without myelopathy or radiculopathy, lumbosacral region: Secondary | ICD-10-CM

## 2013-03-14 DIAGNOSIS — R259 Unspecified abnormal involuntary movements: Secondary | ICD-10-CM | POA: Insufficient documentation

## 2013-03-14 DIAGNOSIS — M533 Sacrococcygeal disorders, not elsewhere classified: Secondary | ICD-10-CM

## 2013-03-14 DIAGNOSIS — G622 Polyneuropathy due to other toxic agents: Secondary | ICD-10-CM

## 2013-03-14 DIAGNOSIS — M48061 Spinal stenosis, lumbar region without neurogenic claudication: Secondary | ICD-10-CM | POA: Insufficient documentation

## 2013-03-14 MED ORDER — DULOXETINE HCL 30 MG PO CPEP: 30.0000 mg | ORAL_CAPSULE | Freq: Every day | ORAL | Status: DC

## 2013-03-14 NOTE — Progress Notes (Signed)
Subjective:    Patient ID: Timothy [name], male    DOB: 03-05-1928, 79 y.o.   MRN: [id number] Multiple myeloma diagnosed in 2007. Chemotherapy approximately one year ago. Has had burning pain in the leg since that time.  Also history of low back pain. Had lumbar disc surgery 1986. Was functioning well postoperatively. Had some intermittent exacerbations. Saw Dr. Marikay Alar neurosurgery approximately 6 months ago. No surgical intervention was recommended. Was recommended to try some lumbar facet procedures. These were performed by Dr. Modesto Charon. A radiofrequency procedure was then performed but the patient got no relief from this procedure.  HPI Goes to PT at [facility] on Tuesday and [employer] [place] M-W-F Left foot shaking when in bed at night. No pain with this. Pain Inventory Average Pain 3 Pain Right Now 6 My pain is dull and aching  In the last 24 hours, has pain interfered with the following? General activity 5 Relation with others 5 Enjoyment of life 5 What TIME of day is your pain at its worst? daytime Sleep (in general) Good  Pain is worse with: walking, bending and standing Pain improves with: rest and medication Relief from Meds: 4  Mobility walk without assistance  Function retired  Neuro/Psych weakness trouble walking  Prior Studies Any changes since last visit?  no  Physicians involved in your care Any changes since last visit?  no   History reviewed. No pertinent family history. History   Social History  . Marital Status: Married    Spouse Name: N/A    Number of Children: N/A  . Years of Education: N/A   Social History Main Topics  . Smoking status: Former Smoker -- 1.00 packs/day for 20 years    Types: Cigarettes    Quit date: 11/20/1984  . Smokeless tobacco: Never Used  . Alcohol Use: No  . Drug Use: No  . Sexually Active: Not Currently   Other Topics Concern  . None   Social History Narrative   Lives at home with wife, has  children but not with current wife. Still active, golfing and walking in the yard before the issue with the wound, now using a walker.    Past Surgical History  Procedure Laterality Date  . Laminectomy    . Inguinal hernia repair    . Cardiac catheterization  06/20/2000    Severe coronary disease (totally occluded right artery, 90% left circumflex, 50-60% intermediate, and 90-95% ostial left anterior descending)   . Tonsillectomy    . Coronary artery bypass graft  2002    x4 / Left internal mammary artery to the LAD.  / Saphenous vein graft to the right posterolateral.  /  Saphenous vein graft to  the ramus intermedius. / Saphenous vein graft to the circumflex marginal     . Amputation  03/06/2012    Procedure: AMPUTATION RAY;  Surgeon: Harvie Junior, MD;  Location: [facility];  Service: Orthopedics;  Laterality: Left;  First Ray Amputation   Past Medical History  Diagnosis Date  . ASCVD (arteriosclerotic cardiovascular disease)   . Anemia     chronic mild anemia  . Gout   . Hypertension   . Lumbar disc disease     post laminectomy  . Chronic back pain   . History of echocardiogram 11/02/2010    EF range of 30 to 35% / There is hypokinsesis of the basal-mild inferolateral myocardium  . CHF (congestive heart failure)   . Heart murmur   . Ischemic cardiomyopathy   .  Renal insufficiency   . Peripheral neuropathy   . Inferior myocardial infarction 1986  . Hyperlipidemia   . SOB (shortness of breath)   . Fatigue   . Peripheral neuropathy   . Coronary artery disease     CABG 2002 by [name] with LIMA to LAD, SVG to intermediate, SVG to LCX, & SVG to PL. Last nuclear in 2012 showing large inferior scar with EF of 39%.   . Blood transfusion     DEC 2012 - TWO UNITS  . 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  PT HAS BEEN OFF CHEMO SINCE HIS HOSPITALIZATION FEB 2013 FOR LEFT FOOT INFECTION  . Cancer   . History of shingles MARCH  2009    SHINGLE LESIONS WERE AROUND RIGHT EYE--PT HAS RESIDUAL ITCHING AROUND THE EYE.  . Osteomyelitis     s/p toe amputation  . Chronic systolic heart failure     EF of 30% per echo 02/2012  . Protein malnutrition    BP 117/59  Pulse 62  Resp 14  Ht 5\' 10"  (1.778 m)  Wt 181 lb 3.2 oz (82.192 kg)  BMI 26 kg/m2  SpO2 97%     Review of Systems  Musculoskeletal: Positive for back pain and gait problem.  Neurological: Positive for weakness.  All other systems reviewed and are negative.       Objective:   Physical Exam    Convex left lumbar scoliosis mild Mildly reduced hip internal/external rotation without pain Normal flexion lumbar spine Limited lumbar extension lateral bending and twisting   Neurological: He is alert and oriented to person, place, and time. He has normal strength and reduce LE reflexes.No evidence of clonus at the ankles . A sensory deficit is present. Gait abnormal. Fwd flexed posture   Psychiatric: He has a normal mood and affect.       Assessment & Plan:  1. Lumbar spinal stenosis without evidence of neurogenic claudication except potentially in the left side. Most recent CT scan shows no stenotic lesions to really explain this other than a mild foraminal stenosis L4-5 on the left side. He has more moderate stenosis on the right side but no lower extremity symptoms.  He does have lumbar facet arthropathy which radiologically looks worse in the upper lumbar area however symptomatically he is most this pain at the lumbosacral junction.  We discussed the need for core strengthening program and I will refer him to physical therapy  We discussed other spinal injections such as sacroiliac injection the left side or left L4-L5, L5-S1 facet medial branch blocks.  2. Peripheral neuropathy pain. He has been unable to tolerate either Lyrica or Neurontin. Will trial Cymbalta 20 mg dose may need to work up to 30 or even 60 mg per day.  Return to clinic one month   3. Left foot shaking I do not think this is radicular. He has no upper motor neuron signs to indicate spasticity. Wondering whether he may have some restless leg syndrome. In terms of narcotic analgesic medications he gets occasional Hydromorphone from his oncologist. I will not prescribe narcotic analgesics

## 2013-03-14 NOTE — Patient Instructions (Signed)
Increase Cymbalta to 30 mg a day this is for neuropathy pain Sacroiliac injection left side on May 8

## 2013-03-18 ENCOUNTER — Ambulatory Visit: Payer: Medicare Other | Admitting: Physical Therapy

## 2013-03-22 IMAGING — CR DG CHEST 2V
2 series · 2 of 2 positions shown · non-contrast
Comparison: 04/20/2011

CLINICAL DATA: Dizziness.  Hypertension.  Shortness of breath
starting yesterday.

CHEST - 2 VIEW

[w chest lat]
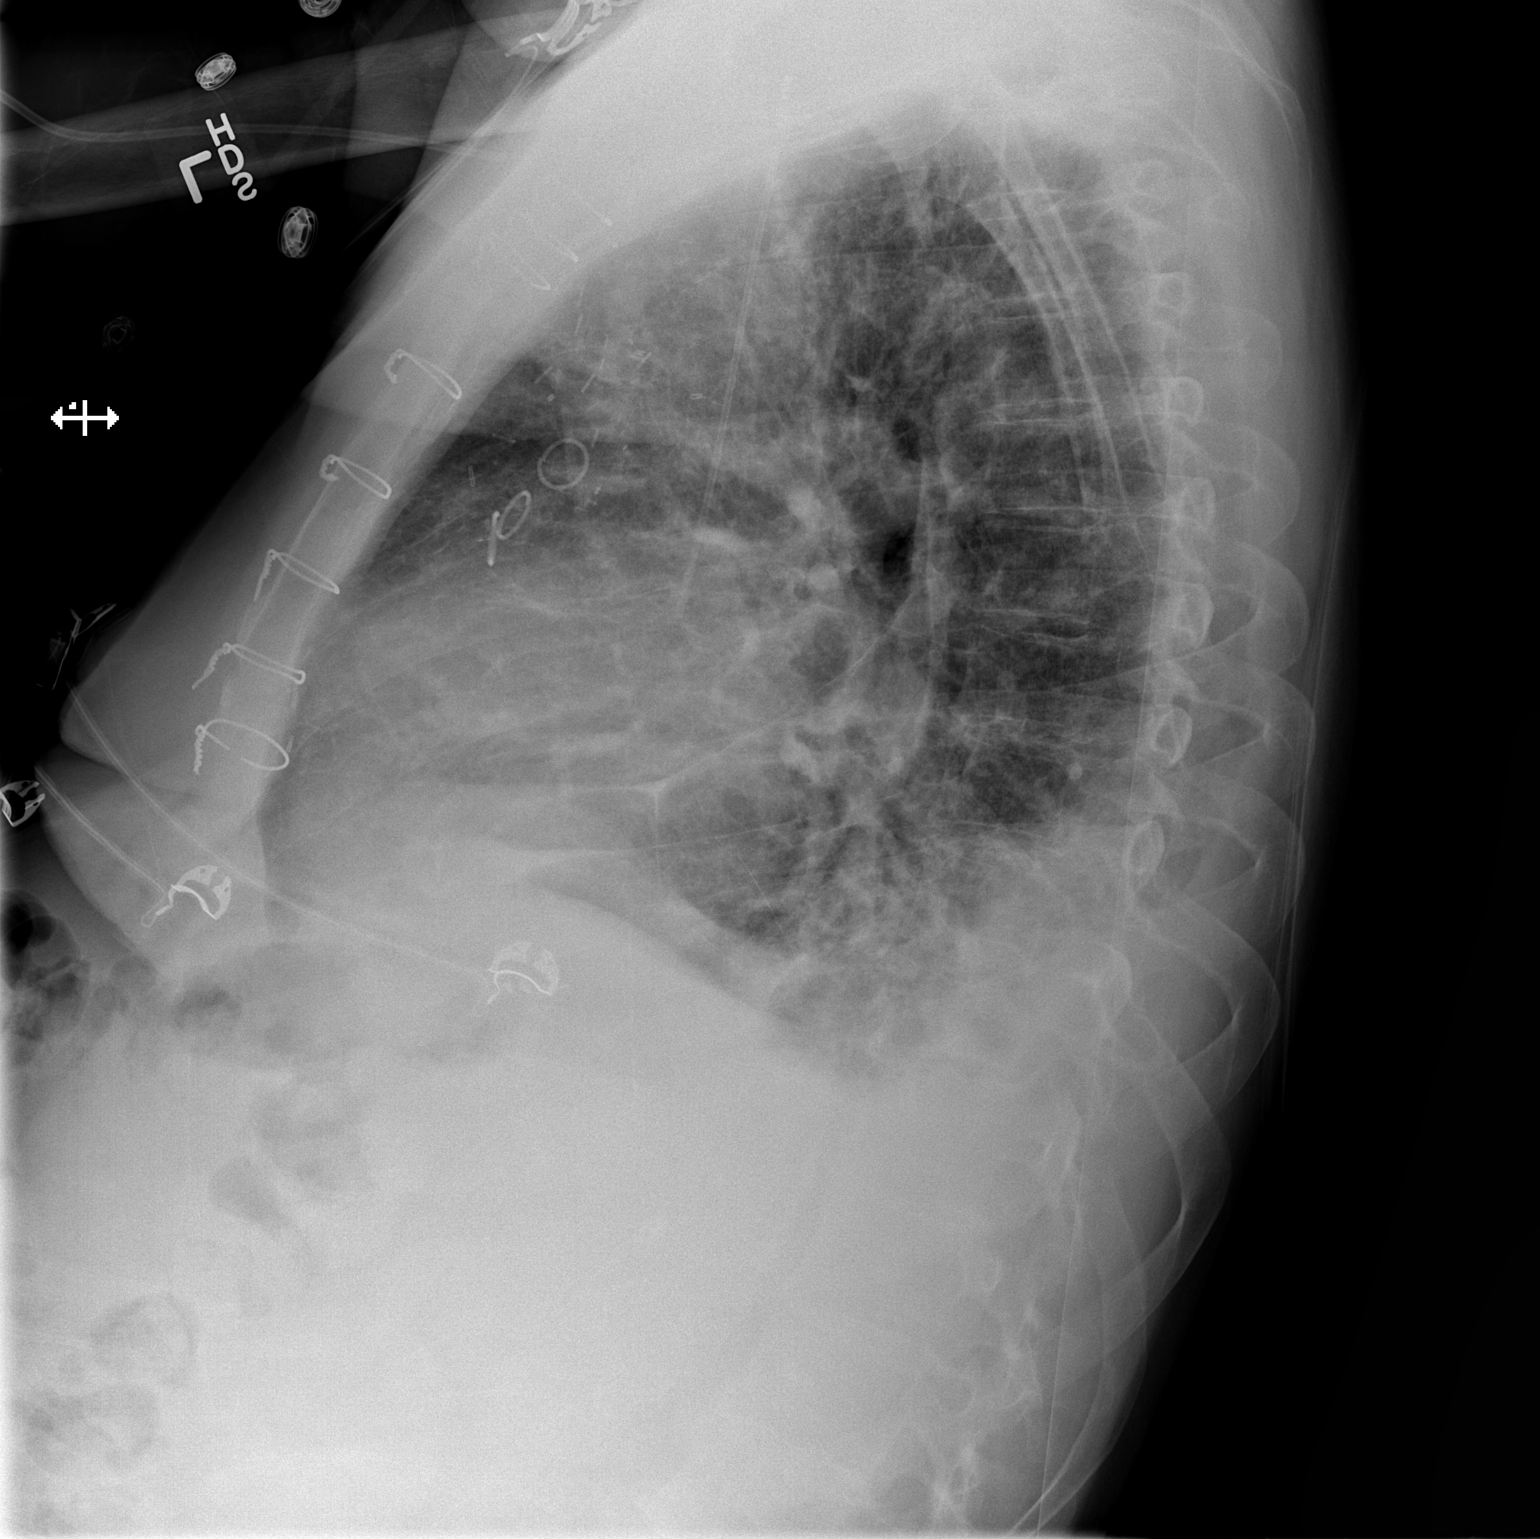

[x chest ap]
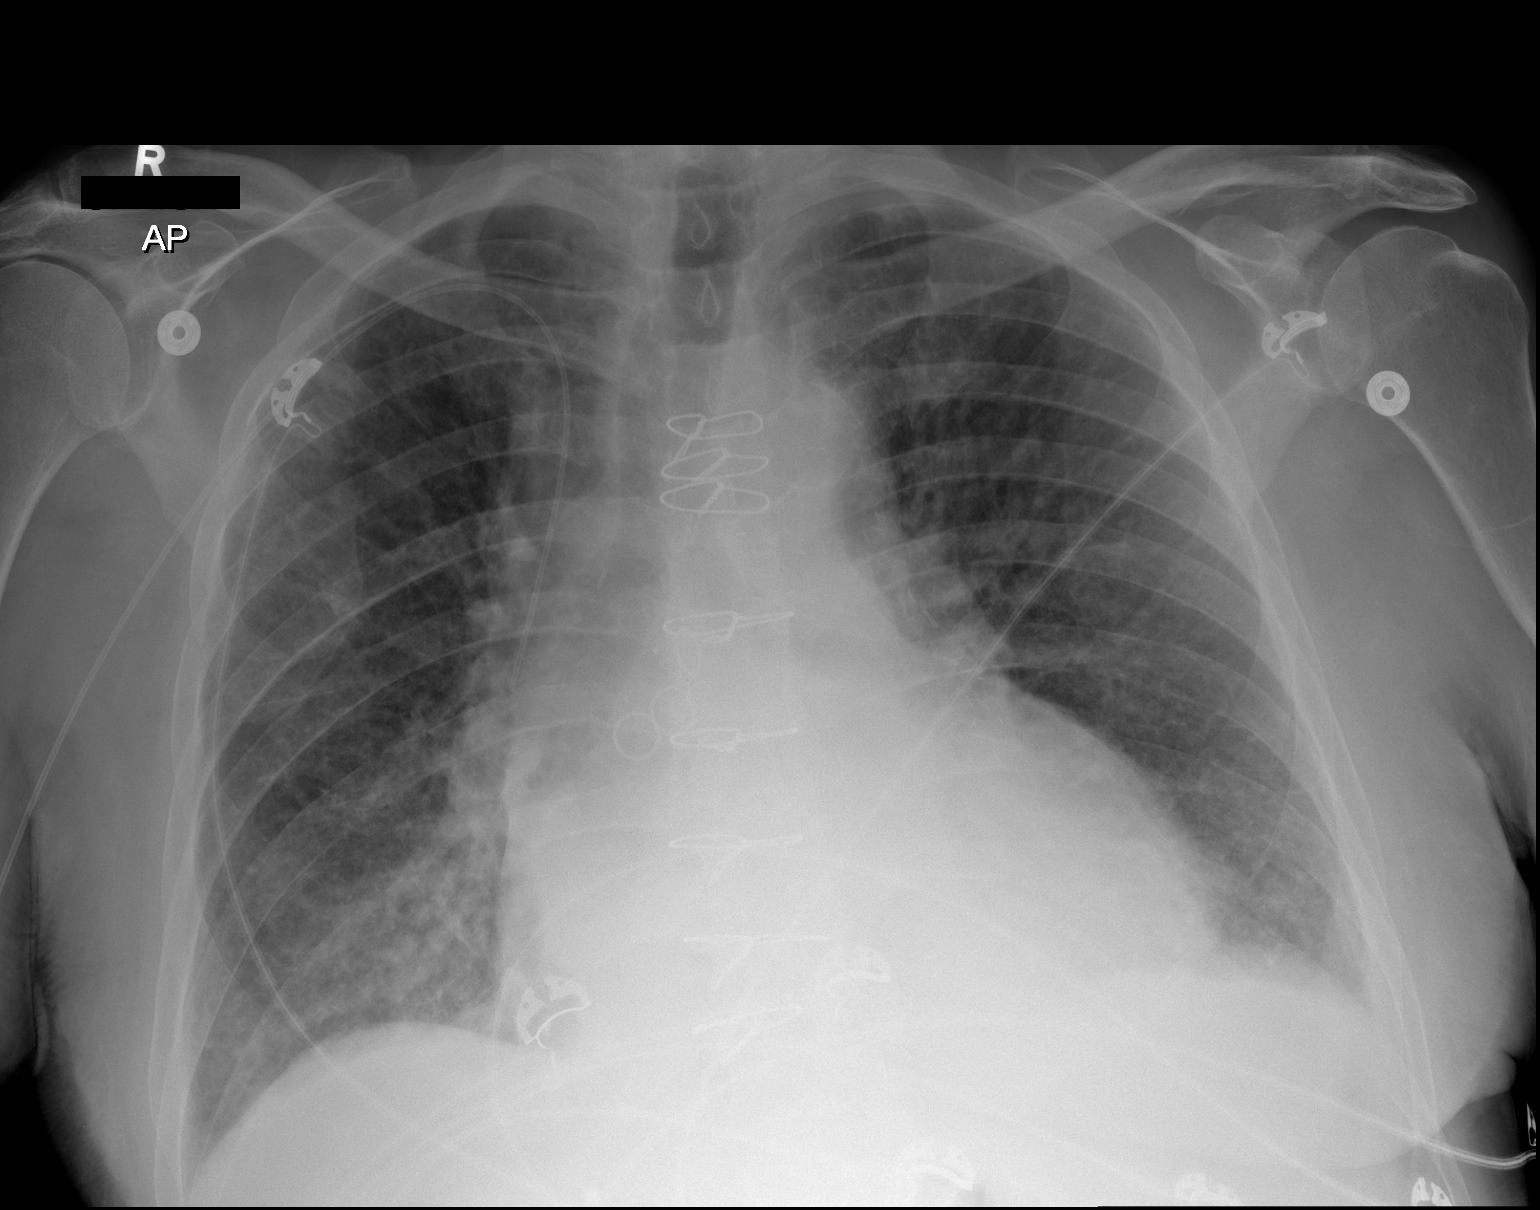

[2 of 2 positions shown; findings below may reference images not displayed]

FINDINGS: Stable appearance of postoperative changes in the
mediastinum.  Interval placement of a right PICC catheter with tip
over the distal SVC region.  Increasing heart size and pulmonary
vascularity suggesting developing congestive failure.  Small
bilateral pleural effusion. Bilateral basilar atelectasis.  No
focal airspace consolidation in the lungs.  No pneumothorax.
IMPRESSION: Developing congestive changes in the heart and lungs without
obvious edema.  Small bilateral pleural effusions and basilar
atelectasis.

## 2013-03-25 ENCOUNTER — Ambulatory Visit: Payer: Medicare Other | Attending: Physical Medicine & Rehabilitation | Admitting: Physical Therapy

## 2013-03-25 DIAGNOSIS — M545 Low back pain, unspecified: Secondary | ICD-10-CM | POA: Insufficient documentation

## 2013-03-25 DIAGNOSIS — IMO0001 Reserved for inherently not codable concepts without codable children: Secondary | ICD-10-CM | POA: Insufficient documentation

## 2013-03-26 ENCOUNTER — Other Ambulatory Visit (HOSPITAL_BASED_OUTPATIENT_CLINIC_OR_DEPARTMENT_OTHER): Payer: Medicare Other

## 2013-03-26 DIAGNOSIS — C9000 Multiple myeloma not having achieved remission: Secondary | ICD-10-CM

## 2013-03-26 DIAGNOSIS — D649 Anemia, unspecified: Secondary | ICD-10-CM

## 2013-03-26 LAB — PROTEIN / CREATININE RATIO, URINE
Creatinine, Urine: 137.8 mg/dL
Total Protein, Urine: 12 mg/dL

## 2013-03-26 LAB — CBC WITH DIFFERENTIAL/PLATELET
BASO%: 0.6 % (ref 0.0–2.0)
Basophils Absolute: 0 10*3/uL (ref 0.0–0.1)
EOS%: 1.9 % (ref 0.0–7.0)
HGB: 9.7 g/dL — ABNORMAL LOW (ref 13.0–17.1)
LYMPH%: 24.2 % (ref 14.0–49.0)
NEUT#: 3.4 10*3/uL (ref 1.5–6.5)
NEUT%: 61.7 % (ref 39.0–75.0)
RBC: 3.1 10*6/uL — ABNORMAL LOW (ref 4.20–5.82)
RDW: 16.5 % — ABNORMAL HIGH (ref 11.0–14.6)
WBC: 5.6 10*3/uL (ref 4.0–10.3)

## 2013-03-26 LAB — COMPREHENSIVE METABOLIC PANEL (CC13)
ALT: 21 U/L (ref 0–55)
AST: 25 U/L (ref 5–34)
BUN: 27.6 mg/dL — ABNORMAL HIGH (ref 7.0–26.0)
Creatinine: 1.3 mg/dL (ref 0.7–1.3)
Total Bilirubin: 0.71 mg/dL (ref 0.20–1.20)

## 2013-03-27 ENCOUNTER — Encounter: Payer: Medicare Other | Attending: Physical Medicine & Rehabilitation

## 2013-03-27 ENCOUNTER — Ambulatory Visit (HOSPITAL_BASED_OUTPATIENT_CLINIC_OR_DEPARTMENT_OTHER): Payer: Medicare Other | Admitting: Physical Medicine & Rehabilitation

## 2013-03-27 ENCOUNTER — Encounter: Payer: Self-pay | Admitting: Physical Medicine & Rehabilitation

## 2013-03-27 VITALS — BP 136/89 | HR 69 | Resp 15 | Ht 70.0 in | Wt 176.0 lb

## 2013-03-27 DIAGNOSIS — IMO0001 Reserved for inherently not codable concepts without codable children: Secondary | ICD-10-CM | POA: Insufficient documentation

## 2013-03-27 DIAGNOSIS — M545 Low back pain, unspecified: Secondary | ICD-10-CM | POA: Insufficient documentation

## 2013-03-27 DIAGNOSIS — M533 Sacrococcygeal disorders, not elsewhere classified: Secondary | ICD-10-CM

## 2013-03-27 LAB — KAPPA/LAMBDA LIGHT CHAINS
Kappa free light chain: 18.4 mg/dL — ABNORMAL HIGH (ref 0.33–1.94)
Kappa:Lambda Ratio: 8.44 — ABNORMAL HIGH (ref 0.26–1.65)

## 2013-03-27 NOTE — Patient Instructions (Signed)
Sacroiliac Joint Dysfunction The sacroiliac joint connects the lower part of the spine (the sacrum) with the bones of the pelvis. CAUSES  Sometimes, there is no obvious reason for sacroiliac joint dysfunction. Other times, it may occur   During pregnancy.  After injury, such as:  Car accidents.  Sport-related injuries.  Work-related injuries.  Due to one leg being shorter than the other.  Due to other conditions that affect the joints, such as:  Rheumatoid arthritis.  Gout.  Psoriasis.  Joint infection (septic arthritis). SYMPTOMS  Symptoms may include:  Pain in the:  Lower back.  Buttocks.  Groin.  Thighs and legs.  Difficult sitting, standing, walking, lying, bending or lifting. DIAGNOSIS  A number of tests may be used to help diagnose the cause of sacroiliac joint dysfunction, including:  Imaging tests to look for other causes of pain, including:  MRI.  CT scan.  Bone scan.  Diagnostic injection: During a special x-ray (called fluoroscopy), a needle is put into the sacroiliac joint. A numbing medicine is injected into the joint. If the pain is improved or stopped, the diagnosis of sacroiliac joint dysfunction is more likely. TREATMENT  There are a number of types of treatment used for sacroiliac joint dysfunction, including:  Only take over-the-counter or prescription medicines for pain, discomfort, or fever as directed by your caregiver.  Medications to relax muscles.  Rest. Decreasing activity can help cut down on painful muscle spasms and allow the back to heal.  Application of heat or ice to the lower back may improve muscle spasms and soothe pain.  Brace. A special back brace, called a sacroiliac belt, can help support the joint while your back is healing.  Physical therapy can help teach comfortable positions and exercises to strengthen muscles that support the sacroiliac joint.  Cortisone injections. Injections of steroid medicine into the  joint can help decrease swelling and improve pain.  Hyaluronic acid injections. This chemical improves lubrication within the sacroiliac joint, thereby decreasing pain.  Radiofrequency ablation. A special needle is placed into the joint, where it burns away nerves that are carrying pain messages from the joint.  Surgery. Because pain occurs during movement of the joint, screws and plates may be installed in order to limit or prevent joint motion. HOME CARE INSTRUCTIONS   Take all medications exactly as directed.  Follow instructions regarding both rest and physical activity, to avoid worsening the pain.  Do physical therapy exercises exactly as prescribed. SEEK IMMEDIATE MEDICAL CARE IF:  You experience increasingly severe pain.  You develop new symptoms, such as numbness or tingling in your legs or feet.  You lose bladder or bowel control. Document Released: 02/02/2009 Document Revised: 01/29/2012 Document Reviewed: 02/02/2009 Exit[employer] Patient Information 2013 [employer].  

## 2013-03-27 NOTE — Progress Notes (Signed)
  PROCEDURE RECORD [facility]   Name: [name] DOB:08-Jun-1928 MRN: [id number]  Date:03/27/2013  Physician: Claudette Laws, MD    Nurse/CMA: Shumaker RN  Allergies:  Allergies  Allergen Reactions  . Morphine And Related Other (See Comments)    Could not seat still    Consent Signed: yes  Is patient diabetic? no  CBG today? N/a  Pregnant: no LMP: No LMP for male patient. (age 77-55)  Anticoagulants: no Anti-inflammatory: no Antibiotics: no  Procedure: left sacroiliac steroid injection  Position: Prone Start Time: 11:15  End Time:  11:20  Fluoro Time: 19 seconds  RN/CMA [employer] RN    Time 10:56 11:23    BP 136/89 118/51    Pulse 69 80    Respirations 15 16    O2 Sat 100 96    S/S 6 6    Pain Level 0 0     D/C home with wife, patient A & O X 3, D/C instructions reviewed, and sits independently.

## 2013-03-27 NOTE — Progress Notes (Signed)
Left sacroiliac injection under fluoroscopic guidance  Indication: Left Low back and buttocks pain not relieved by medication management and other conservative care.  Informed consent was obtained after describing risks and benefits of the procedure with the patient, this includes bleeding, bruising, infection, paralysis and medication side effects. The patient wishes to proceed and has given written consent. The patient was placed in a prone position. The lumbar and sacral area was marked and prepped with Betadine. A 25-gauge 1-1/2 inch needle was inserted into the skin and subcutaneous tissue and 1 mL of 1% lidocaine was injected. Then a 25-gauge 3 inch spinal needle was inserted under fluoroscopic guidance into the left sacroiliac joint. AP and lateral images were utilized. Omnipaque 180x0.5 mL under live fluoroscopy demonstrated no intravascular uptake. Then a solution containing one ML of 40 mg per mL depomedrol and 2 ML of 1% lidocaine MPF was injected x1.5 mL. Patient tolerated the procedure well. Post procedure instructions were given. Please see post procedure form. 

## 2013-03-31 ENCOUNTER — Telehealth: Payer: Self-pay

## 2013-03-31 NOTE — Telephone Encounter (Signed)
Patient would like a call from Dr Wynn Banker.  Did not specify why.

## 2013-04-01 ENCOUNTER — Ambulatory Visit: Payer: Medicare Other | Admitting: Physical Therapy

## 2013-04-02 ENCOUNTER — Other Ambulatory Visit: Payer: Medicare Other

## 2013-04-07 ENCOUNTER — Telehealth: Payer: Self-pay | Admitting: Physical Medicine & Rehabilitation

## 2013-04-07 NOTE — Telephone Encounter (Signed)
Schedule for a sacroiliac injection with me

## 2013-04-08 ENCOUNTER — Encounter: Payer: Medicare Other | Admitting: Physical Therapy

## 2013-04-11 ENCOUNTER — Ambulatory Visit: Payer: Medicare Other | Admitting: Physical Medicine & Rehabilitation

## 2013-04-14 ENCOUNTER — Encounter: Payer: Self-pay | Admitting: Physical Medicine & Rehabilitation

## 2013-04-15 ENCOUNTER — Encounter: Payer: Medicare Other | Admitting: Physical Therapy

## 2013-04-15 MED ORDER — DULOXETINE HCL 30 MG PO CPEP: 30.0000 mg | ORAL_CAPSULE | Freq: Every day | ORAL | Status: DC

## 2013-04-23 ENCOUNTER — Other Ambulatory Visit (HOSPITAL_BASED_OUTPATIENT_CLINIC_OR_DEPARTMENT_OTHER): Payer: Medicare Other | Admitting: Lab

## 2013-04-23 DIAGNOSIS — C9000 Multiple myeloma not having achieved remission: Secondary | ICD-10-CM

## 2013-04-23 LAB — CBC WITH DIFFERENTIAL/PLATELET
BASO%: 1.1 % (ref 0.0–2.0)
Eosinophils Absolute: 0.1 10*3/uL (ref 0.0–0.5)
LYMPH%: 22.9 % (ref 14.0–49.0)
MCHC: 33.8 g/dL (ref 32.0–36.0)
MCV: 90.5 fL (ref 79.3–98.0)
MONO%: 11.1 % (ref 0.0–14.0)
NEUT#: 5 10*3/uL (ref 1.5–6.5)
Platelets: 143 10*3/uL (ref 140–400)
RBC: 3.09 10*6/uL — ABNORMAL LOW (ref 4.20–5.82)
RDW: 16.6 % — ABNORMAL HIGH (ref 11.0–14.6)
WBC: 7.9 10*3/uL (ref 4.0–10.3)

## 2013-04-23 LAB — COMPREHENSIVE METABOLIC PANEL (CC13)
Albumin: 3.6 g/dL (ref 3.5–5.0)
BUN: 31.6 mg/dL — ABNORMAL HIGH (ref 7.0–26.0)
CO2: 21 mEq/L — ABNORMAL LOW (ref 22–29)
Calcium: 8.7 mg/dL (ref 8.4–10.4)
Chloride: 95 mEq/L — ABNORMAL LOW (ref 98–107)
Creatinine: 1.4 mg/dL — ABNORMAL HIGH (ref 0.7–1.3)
Glucose: 95 mg/dl (ref 70–99)
Potassium: 5.6 mEq/L — ABNORMAL HIGH (ref 3.5–5.1)

## 2013-04-23 LAB — PROTEIN / CREATININE RATIO, URINE: Total Protein, Urine: 8 mg/dL

## 2013-04-24 LAB — KAPPA/LAMBDA LIGHT CHAINS
Kappa free light chain: 17.7 mg/dL — ABNORMAL HIGH (ref 0.33–1.94)
Lambda Free Lght Chn: 1.63 mg/dL (ref 0.57–2.63)

## 2013-04-29 ENCOUNTER — Encounter: Payer: Self-pay | Admitting: Physical Medicine & Rehabilitation

## 2013-04-29 ENCOUNTER — Ambulatory Visit (HOSPITAL_BASED_OUTPATIENT_CLINIC_OR_DEPARTMENT_OTHER): Payer: Medicare Other | Admitting: Physical Medicine & Rehabilitation

## 2013-04-29 VITALS — BP 117/50 | HR 76 | Resp 14 | Ht 70.0 in | Wt 177.4 lb

## 2013-04-29 DIAGNOSIS — I10 Essential (primary) hypertension: Secondary | ICD-10-CM | POA: Insufficient documentation

## 2013-04-29 DIAGNOSIS — M48061 Spinal stenosis, lumbar region without neurogenic claudication: Secondary | ICD-10-CM | POA: Insufficient documentation

## 2013-04-29 DIAGNOSIS — M533 Sacrococcygeal disorders, not elsewhere classified: Secondary | ICD-10-CM

## 2013-04-29 DIAGNOSIS — M545 Low back pain, unspecified: Secondary | ICD-10-CM | POA: Insufficient documentation

## 2013-04-29 DIAGNOSIS — I2510 Atherosclerotic heart disease of native coronary artery without angina pectoris: Secondary | ICD-10-CM | POA: Insufficient documentation

## 2013-04-29 DIAGNOSIS — M79609 Pain in unspecified limb: Secondary | ICD-10-CM | POA: Insufficient documentation

## 2013-04-29 DIAGNOSIS — G609 Hereditary and idiopathic neuropathy, unspecified: Secondary | ICD-10-CM | POA: Insufficient documentation

## 2013-04-29 DIAGNOSIS — E785 Hyperlipidemia, unspecified: Secondary | ICD-10-CM | POA: Insufficient documentation

## 2013-04-29 DIAGNOSIS — I252 Old myocardial infarction: Secondary | ICD-10-CM | POA: Insufficient documentation

## 2013-04-29 DIAGNOSIS — Z951 Presence of aortocoronary bypass graft: Secondary | ICD-10-CM | POA: Insufficient documentation

## 2013-04-29 DIAGNOSIS — C9000 Multiple myeloma not having achieved remission: Secondary | ICD-10-CM | POA: Insufficient documentation

## 2013-04-29 NOTE — Patient Instructions (Signed)
If the sacroiliac is not helpful another option would be a left L4-L5 transforaminal lumbar epidural injection. There is some mild stenosis at that level

## 2013-04-29 NOTE — Progress Notes (Signed)
  PROCEDURE RECORD [facility]   Name: Timothy [name] DOB:October 24, 1928 MRN: [id number]  Date:04/29/2013  Physician: Claudette Laws, MD    Nurse/CMA: Khady Vandenberg RN  Allergies:  Allergies  Allergen Reactions  . Morphine And Related Other (See Comments)    Could not seat still    Consent Signed: yes  Is patient diabetic? no  CBG today?   Pregnant: no LMP: No LMP for male patient. (age 77-55)  Anticoagulants: no Anti-inflammatory: no Antibiotics: no  Procedure: Leftt Sacroiliac Steroid Injection Position: Prone Start Time: 12:59 End Time: 1:01 Fluoro Time:  10 seconds  RN/CMA [employer]    Time 12:35 1:07    BP 117/50 122/52    Pulse 76 74    Respirations 14 14    O2 Sat 97 100    S/S 6 6    Pain Level 7/10 7/10     D/C home with wife, patient A & O X 3, D/C instructions reviewed, and sits independently.

## 2013-04-29 NOTE — Progress Notes (Signed)
Left sacroiliac injection under fluoroscopic guidance  Indication: Left Low back and buttocks pain not relieved by medication management and other conservative care.  Informed consent was obtained after describing risks and benefits of the procedure with the patient, this includes bleeding, bruising, infection, paralysis and medication side effects. The patient wishes to proceed and has given written consent. The patient was placed in a prone position. The lumbar and sacral area was marked and prepped with Betadine. A 25-gauge 1-1/2 inch needle was inserted into the skin and subcutaneous tissue and 1 mL of 1% lidocaine was injected. Then a 25-gauge 3 inch spinal needle was inserted under fluoroscopic guidance into the left sacroiliac joint. AP and lateral images were utilized. Omnipaque 180x0.5 mL under live fluoroscopy demonstrated no intravascular uptake. Then a solution containing one ML of 40 mg per mL depomedrol and 2 ML of 1% lidocaine MPF was injected x1.5 mL. Patient tolerated the procedure well. Post procedure instructions were given. Please see post procedure form. 

## 2013-04-30 ENCOUNTER — Other Ambulatory Visit: Payer: Medicare Other

## 2013-05-03 ENCOUNTER — Other Ambulatory Visit: Payer: Self-pay | Admitting: Nurse Practitioner

## 2013-05-05 ENCOUNTER — Telehealth: Payer: Self-pay | Admitting: *Deleted

## 2013-05-05 ENCOUNTER — Encounter: Payer: Self-pay | Admitting: Physician Assistant

## 2013-05-05 NOTE — Telephone Encounter (Signed)
Called Dr. [name] to discuss 04-23-2013 labs he has questions about.  Dr. [name] feels his hgb is dropping and his Kappa is going up more.  Feels he needs to start treatment.  Non specific symptoms only says I'm anemic to signs and symptom question.  Will notify providers.  Dr. [name] can be reached at [phone].

## 2013-05-06 ENCOUNTER — Other Ambulatory Visit: Payer: Self-pay | Admitting: *Deleted

## 2013-05-07 ENCOUNTER — Other Ambulatory Visit: Payer: Self-pay | Admitting: Oncology

## 2013-05-07 DIAGNOSIS — C9001 Multiple myeloma in remission: Secondary | ICD-10-CM

## 2013-05-08 ENCOUNTER — Other Ambulatory Visit: Payer: Self-pay | Admitting: Oncology

## 2013-05-08 ENCOUNTER — Telehealth: Payer: Self-pay | Admitting: Oncology

## 2013-05-12 ENCOUNTER — Other Ambulatory Visit (HOSPITAL_BASED_OUTPATIENT_CLINIC_OR_DEPARTMENT_OTHER): Payer: Medicare Other

## 2013-05-12 DIAGNOSIS — C9001 Multiple myeloma in remission: Secondary | ICD-10-CM

## 2013-05-12 DIAGNOSIS — C9000 Multiple myeloma not having achieved remission: Secondary | ICD-10-CM

## 2013-05-12 LAB — CBC & DIFF AND RETIC
Basophils Absolute: 0 10*3/uL (ref 0.0–0.1)
Eosinophils Absolute: 0.1 10*3/uL (ref 0.0–0.5)
HGB: 9.9 g/dL — ABNORMAL LOW (ref 13.0–17.1)
LYMPH%: 24 % (ref 14.0–49.0)
MCV: 89.3 fL (ref 79.3–98.0)
MONO%: 11 % (ref 0.0–14.0)
NEUT#: 4.5 10*3/uL (ref 1.5–6.5)
NEUT%: 63.6 % (ref 39.0–75.0)
Platelets: 179 10*3/uL (ref 140–400)

## 2013-05-12 LAB — COMPREHENSIVE METABOLIC PANEL (CC13)
Albumin: 3.5 g/dL (ref 3.5–5.0)
CO2: 23 mEq/L (ref 22–29)
Glucose: 85 mg/dl (ref 70–99)
Potassium: 4.1 mEq/L (ref 3.5–5.1)
Sodium: 130 mEq/L — ABNORMAL LOW (ref 136–145)
Total Protein: 6.8 g/dL (ref 6.4–8.3)

## 2013-05-13 ENCOUNTER — Ambulatory Visit (HOSPITAL_BASED_OUTPATIENT_CLINIC_OR_DEPARTMENT_OTHER): Payer: Medicare Other | Admitting: Physical Medicine & Rehabilitation

## 2013-05-13 ENCOUNTER — Encounter: Payer: Self-pay | Admitting: Physical Medicine & Rehabilitation

## 2013-05-13 ENCOUNTER — Encounter: Payer: Medicare Other | Attending: Physical Medicine & Rehabilitation

## 2013-05-13 VITALS — BP 107/52 | HR 63 | Resp 14 | Ht 70.0 in | Wt 178.0 lb

## 2013-05-13 DIAGNOSIS — C9000 Multiple myeloma not having achieved remission: Secondary | ICD-10-CM

## 2013-05-13 DIAGNOSIS — IMO0002 Reserved for concepts with insufficient information to code with codable children: Secondary | ICD-10-CM

## 2013-05-13 DIAGNOSIS — M5416 Radiculopathy, lumbar region: Secondary | ICD-10-CM

## 2013-05-13 DIAGNOSIS — M545 Low back pain: Secondary | ICD-10-CM

## 2013-05-13 DIAGNOSIS — M79605 Pain in left leg: Secondary | ICD-10-CM

## 2013-05-13 MED ORDER — DULOXETINE HCL 30 MG PO CPEP: 60.0000 mg | ORAL_CAPSULE | Freq: Every day | ORAL | Status: DC

## 2013-05-13 NOTE — Patient Instructions (Addendum)
Increased Cymbalta to 60,  Dr. Yetta Barre from neurosurgery MRI ordered for [facility]

## 2013-05-13 NOTE — Progress Notes (Signed)
Subjective:    Patient ID: Timothy [name], male    DOB: 09/19/1928, 79 y.o.   MRN: [id number]  HPI Multiple myeloma diagnosed in 2007.  Chemotherapy approximately one year ago. Has had burning pain in the leg since that time. Also history of low back pain. Had lumbar disc surgery 1986. Was functioning well postoperatively. Had some intermittent exacerbations. Saw Dr. Marikay Alar neurosurgery approximately 6 months ago. No surgical intervention was recommended. Was recommended to try some lumbar facet procedures. These were performed by Dr. Modesto Charon. A radiofrequency procedure was then performed but the patient got no relief from this procedure. No physical therapy. Has been working out on his own in the gym. Using exercise machine in the gym to work on strengthening upper and lower body. Using free weights for biceps. Walking for extended period time causes left-sided buttock pain radiating into the lateral thigh. No pain with sitting Sacroiliac injection on 05/01/2013 was not helpful at all. Has pain in the low back radiating into the lateral thigh. Reviewed last MRI from 2011 showed the left L2-3 disc resolved. There was no significant compressive lesions at that time. Pain Inventory Average Pain na Pain Right Now na My pain is na  In the last 24 hours, has pain interfered with the following? General activity na Relation with others na Enjoyment of life na What TIME of day is your pain at its worst? na Sleep (in general) NA  Pain is worse with: na Pain improves with: na Relief from Meds: na  Mobility Do you have any goals in this area?  no  Function Do you have any goals in this area?  no  Neuro/Psych No problems in this area  Prior Studies Any changes since last visit?  no  Physicians involved in your care Any changes since last visit?  no   History reviewed. No pertinent family history. History   Social History  . Marital Status: Married    Spouse Name: N/A   Number of Children: N/A  . Years of Education: N/A   Social History Main Topics  . Smoking status: Former Smoker -- 1.00 packs/day for 20 years    Types: Cigarettes    Quit date: 11/20/1984  . Smokeless tobacco: Never Used  . Alcohol Use: No  . Drug Use: No  . Sexually Active: Not Currently   Other Topics Concern  . None   Social History Narrative   Lives at home with wife, has children but not with current wife. Still active, golfing and walking in the yard before the issue with the wound, now using a walker.    Past Surgical History  Procedure Laterality Date  . Laminectomy    . Inguinal hernia repair    . Cardiac catheterization  06/20/2000    Severe coronary disease (totally occluded right artery, 90% left circumflex, 50-60% intermediate, and 90-95% ostial left anterior descending)   . Tonsillectomy    . Coronary artery bypass graft  2002    x4 / Left internal mammary artery to the LAD.  / Saphenous vein graft to the right posterolateral.  /  Saphenous vein graft to  the ramus intermedius. / Saphenous vein graft to the circumflex marginal     . Amputation  03/06/2012    Procedure: AMPUTATION RAY;  Surgeon: Harvie Junior, MD;  Location: [facility];  Service: Orthopedics;  Laterality: Left;  First Ray Amputation   Past Medical History  Diagnosis Date  . ASCVD (arteriosclerotic cardiovascular disease)   .  Anemia     chronic mild anemia  . Gout   . Hypertension   . Lumbar disc disease     post laminectomy  . Chronic back pain   . History of echocardiogram 11/02/2010    EF range of 30 to 35% / There is hypokinsesis of the basal-mild inferolateral myocardium  . CHF (congestive heart failure)   . Heart murmur   . Ischemic cardiomyopathy   . Renal insufficiency   . Peripheral neuropathy   . Inferior myocardial infarction 1986  . Hyperlipidemia   . SOB (shortness of breath)   . Fatigue   . Peripheral neuropathy   . Coronary artery disease     CABG 2002 by [name] with LIMA  to LAD, SVG to intermediate, SVG to LCX, & SVG to PL. Last nuclear in 2012 showing large inferior scar with EF of 39%.   . Blood transfusion     DEC 2012 - TWO UNITS  . 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  PT HAS BEEN OFF CHEMO SINCE HIS HOSPITALIZATION FEB 2013 FOR LEFT FOOT INFECTION  . Cancer   . History of shingles MARCH 2009    SHINGLE LESIONS WERE AROUND RIGHT EYE--PT HAS RESIDUAL ITCHING AROUND THE EYE.  . Osteomyelitis     s/p toe amputation  . Chronic systolic heart failure     EF of 30% per echo 02/2012  . Protein malnutrition    BP 107/52  Pulse 63  Resp 14  Ht 5\' 10"  (1.778 m)  Wt 178 lb (80.74 kg)  BMI 25.54 kg/m2  SpO2 100%     Review of Systems  All other systems reviewed and are negative.       Objective:   Physical Exam  Decreased sensation bilateral L4-L5 and S1 dermatomal distribution. There is stocking distribution numbness in both lower extremities. There is also some numbness along the lateral calf bilaterally.  Deep tendon reflexes 2+ bilateral patellar, 1+ bilateral Achilles Ambulation without toe drag or knee instability Status post left great toe amputation  Strength is good Back has mild tenderness to palpation bilateral lumbar paraspinal muscles.     Assessment & Plan:  1. Low back pain and left lateral thigh pain. Prior history of L2-L3 disc. This could be recurrence which may be causing the lateral thigh numbness. Also with bilateral lateral calf numbness which I do not think would be related to peripheral neuropathy. The foot numbness is most likely related to peripheral neuropathy.  Would recommend repeat MRI of the lumbar spine, with and without contrast history of multiple myeloma. The patient will followup with Dr. Yetta Barre from neurosurgery. I'll see him on a when necessary basis. If he needs a left lumbar epidural injection  Over half of the 25 min visit was spent counseling  and coordinating care.

## 2013-05-14 ENCOUNTER — Telehealth: Payer: Self-pay | Admitting: *Deleted

## 2013-05-14 ENCOUNTER — Ambulatory Visit: Payer: Medicare Other | Admitting: Oncology

## 2013-05-14 LAB — PROTEIN ELECTROPHORESIS, SERUM
Albumin ELP: 58.6 % (ref 55.8–66.1)
Beta 2: 4.4 % (ref 3.2–6.5)
Total Protein, Serum Electrophoresis: 6.5 g/dL (ref 6.0–8.3)

## 2013-05-14 LAB — KAPPA/LAMBDA LIGHT CHAINS
Kappa:Lambda Ratio: 8.25 — ABNORMAL HIGH (ref 0.26–1.65)
Lambda Free Lght Chn: 2.51 mg/dL (ref 0.57–2.63)

## 2013-05-14 NOTE — Telephone Encounter (Signed)
Called patient back to report that Dr Darnelle Catalan has reviewed labs and does not see anything significant to indicate seeing him sooner. He will be glad to see patient this afternoon if there are other issues he needs to address with him.  Patient is scheduled for an MRI on 05/20/13 and Dr Darnelle Catalan would like have results of this to review to look for any changes before seeing patient. Patient verbalized understanding and will see [place] on already scheduled appt on 05/28/13 as scheduled. Patient knows to call us before then if any questions or concerns. Labs will be cancelled for 05/21/13 as well since they were done on 05/12/13.

## 2013-05-15 ENCOUNTER — Other Ambulatory Visit: Payer: Self-pay

## 2013-05-15 MED ORDER — DULOXETINE HCL 30 MG PO CPEP: 60.0000 mg | ORAL_CAPSULE | Freq: Every day | ORAL | Status: DC

## 2013-05-20 ENCOUNTER — Other Ambulatory Visit: Payer: Self-pay | Admitting: Physical Medicine & Rehabilitation

## 2013-05-20 ENCOUNTER — Telehealth: Payer: Self-pay | Admitting: *Deleted

## 2013-05-20 ENCOUNTER — Ambulatory Visit (HOSPITAL_COMMUNITY)
Admission: RE | Admit: 2013-05-20 | Discharge: 2013-05-20 | Disposition: A | Discharge status: Home / Self Care | Payer: Medicare Other | Source: Ambulatory Visit | Attending: Physical Medicine & Rehabilitation | Admitting: Physical Medicine & Rehabilitation

## 2013-05-20 DIAGNOSIS — M47817 Spondylosis without myelopathy or radiculopathy, lumbosacral region: Secondary | ICD-10-CM | POA: Insufficient documentation

## 2013-05-20 DIAGNOSIS — M5137 Other intervertebral disc degeneration, lumbosacral region: Secondary | ICD-10-CM | POA: Insufficient documentation

## 2013-05-20 DIAGNOSIS — M5416 Radiculopathy, lumbar region: Secondary | ICD-10-CM

## 2013-05-20 DIAGNOSIS — M4120 Other idiopathic scoliosis, site unspecified: Secondary | ICD-10-CM | POA: Insufficient documentation

## 2013-05-20 DIAGNOSIS — M51379 Other intervertebral disc degeneration, lumbosacral region without mention of lumbar back pain or lower extremity pain: Secondary | ICD-10-CM | POA: Insufficient documentation

## 2013-05-20 DIAGNOSIS — N289 Disorder of kidney and ureter, unspecified: Secondary | ICD-10-CM | POA: Insufficient documentation

## 2013-05-20 DIAGNOSIS — IMO0002 Reserved for concepts with insufficient information to code with codable children: Secondary | ICD-10-CM | POA: Insufficient documentation

## 2013-05-20 MED ORDER — GADOBENATE DIMEGLUMINE 529 MG/ML IV SOLN
20.0000 mL | Freq: Once | INTRAVENOUS | Status: AC
  Administered 2013-05-20: 18 mL via INTRAVENOUS

## 2013-05-20 NOTE — Telephone Encounter (Signed)
Pt called inquiring MD recommendation per need for contrast with MRI today and kidney function.  This RN reviewed most recent labs with MD ( BUN and creatinine) with recommendation to proceed with contrast for the MRI but pt needs to increase fluid intake by at least 1 quart.  This RN discussed above with pt who verbalized understanding.

## 2013-05-21 ENCOUNTER — Other Ambulatory Visit: Payer: Medicare Other

## 2013-05-21 ENCOUNTER — Other Ambulatory Visit: Payer: Self-pay | Admitting: Oncology

## 2013-05-28 ENCOUNTER — Telehealth: Payer: Self-pay | Admitting: Oncology

## 2013-05-28 ENCOUNTER — Other Ambulatory Visit: Payer: Medicare Other

## 2013-05-28 ENCOUNTER — Ambulatory Visit (HOSPITAL_BASED_OUTPATIENT_CLINIC_OR_DEPARTMENT_OTHER): Payer: Medicare Other | Admitting: Oncology

## 2013-05-28 VITALS — BP 125/64 | HR 68 | Temp 97.7°F | Resp 20 | Ht 70.0 in | Wt 178.2 lb

## 2013-05-28 DIAGNOSIS — C9000 Multiple myeloma not having achieved remission: Secondary | ICD-10-CM

## 2013-05-28 NOTE — Progress Notes (Signed)
ID: Timothy [name]   DOB: 1927-12-13  MR#: [id number]  WUJ#:[id number]  PCP:  SU: OTHER MD: Timothy [name], Timothy [name]  HISTORY OF PRESENT ILLNESS: Patient presented in April 2007 with symptomatic slowly progressive anemia, with normal iron parameters, folic acid, and vitamin B12. Plans were made to obtain bone marrow aspirate and biopsy as an outpatient, however the patient was hospitalized shortly thereafter with a creatinine of 8.6. Renal biopsy demonstrated the presence of  light chain deposition disease. Bone marrow biopsy demonstrated 37% plasma cells. A skeletal survey showed small calvarial lytic lesions, mild osteopenia, and cervical spondylosis. With a well-established diagnosis of light-chain multiple myeloma Timothy [name] subsequent treatments are as detailed below.   INTERVAL HISTORY: Timothy [name] returns today with his wife, Timothy [name], for followup of his multiple myeloma . He was concerned that his myeloma might be progressing, because he was having more symptoms. However the labs, as detailed below, show stable disease.    REVIEW OF SYSTEMS: Date has not been doing well. The pain in his back is "very bad". He saw Timothy [name] stains for this and receive some rehabilitation through the pain clinic, but he is very discouraged. He doesn't her 20 sits or lies down, but he does when he walks and of course when he was to do is play golf, which he can't do right now. He can barely do any yardwork. Also he fell 05/24/2013, hurting his left arm, left leg, and bumping the top of his head on the left. There have been no visual changes, dizziness, nausea, vomiting, or confusion. He has a walking cane but does not use it. There has been no fever, no overt bleeding, no change in bowel or bladder habits. A detailed review of systems was otherwise stable   PAST MEDICAL HISTORY: Past Medical History  Diagnosis Date  . ASCVD (arteriosclerotic cardiovascular disease)   . Anemia     chronic mild  anemia  . Gout   . Hypertension   . Lumbar disc disease     post laminectomy  . Chronic back pain   . History of echocardiogram 11/02/2010    EF range of 30 to 35% / There is hypokinsesis of the basal-mild inferolateral myocardium  . CHF (congestive heart failure)   . Heart murmur   . Ischemic cardiomyopathy   . Renal insufficiency   . Peripheral neuropathy   . Inferior myocardial infarction 1986  . Hyperlipidemia   . SOB (shortness of breath)   . Fatigue   . Peripheral neuropathy   . Coronary artery disease     CABG 2002 by [name] with LIMA to LAD, SVG to intermediate, SVG to LCX, & SVG to PL. Last nuclear in 2012 showing large inferior scar with EF of 39%.   . Blood transfusion     DEC 2012 - TWO UNITS  . Myeloma     Multiple myeloma, with recurrence of increasing problems  Timothy [name] -ONCOLOGIST.  PT HAS BEEN OFF CHEMO SINCE HIS HOSPITALIZATION FEB 2013 FOR LEFT FOOT INFECTION  . Cancer   . History of shingles MARCH 2009    SHINGLE LESIONS WERE AROUND RIGHT EYE--PT HAS RESIDUAL ITCHING AROUND THE EYE.  . Osteomyelitis     s/p toe amputation  . Chronic systolic heart failure     EF of 30% per echo 02/2012  . Protein malnutrition    PAST SURGICAL HISTORY: Past Surgical History  Procedure Laterality Date  . Laminectomy    . Inguinal hernia  repair    . Cardiac catheterization  06/20/2000    Severe coronary disease (totally occluded right artery, 90% left circumflex, 50-60% intermediate, and 90-95% ostial left anterior descending)   . Tonsillectomy    . Coronary artery bypass graft  2002    x4 / Left internal mammary artery to the LAD.  / Saphenous vein graft to the right posterolateral.  /  Saphenous vein graft to  the ramus intermedius. / Saphenous vein graft to the circumflex marginal     . Amputation  03/06/2012    Procedure: AMPUTATION RAY;  Surgeon: Timothy Junior, MD;  Location: [facility];  Service: Orthopedics;  Laterality: Left;  First Ray Amputation    FAMILY  HISTORY No family history of hematologic malignancies; brother had prostate cancer; no other cancers in the immediate family  SOCIAL HISTORY: Retired International aid/development worker; children from prior marriage.   ADVANCED DIRECTIVES:  HEALTH MAINTENANCE: History  Substance Use Topics  . Smoking status: Former Smoker -- 1.00 packs/day for 20 years    Types: Cigarettes    Quit date: 11/20/1984  . Smokeless tobacco: Never Used  . Alcohol Use: No     Colonoscopy:  Bone density:  Lipid panel:  Allergies  Allergen Reactions  . Morphine And Related Other (See Comments)    Could not seat still    Current Outpatient Prescriptions  Medication Sig Dispense Refill  . allopurinol (ZYLOPRIM) 300 MG tablet 150 mg daily.       Marland Kitchen aspirin 81 MG tablet Take 1 tablet (81 mg total) by mouth every other day.  30 tablet  0  . atorvastatin (LIPITOR) 40 MG tablet Take 0.5 tablets (20 mg total) by mouth daily.  45 tablet  3  . B Complex-Biotin-FA (B COMPLETE PO) Take 0.5 tablets by mouth 2 (two) times daily.       . carvedilol (COREG) 6.25 MG tablet TAKE 1 TABLET TWICE A DAY  180 tablet  2  . Coenzyme Q10 (CO Q-10 PO) Take 1 tablet by mouth daily.       . DULoxetine (CYMBALTA) 30 MG capsule Take 2 capsules (60 mg total) by mouth daily.  180 capsule  1  . HYDROmorphone (DILAUDID) 4 MG tablet Take 1 tablet (4 mg total) by mouth every 4 (four) hours as needed.  90 tablet  0  . lisinopril (PRINIVIL,ZESTRIL) 20 MG tablet TAKE 1 TABLET DAILY  90 tablet  2  . Multiple Vitamin (MULTIVITAMIN) capsule Take 1 capsule by mouth daily.      . nitroGLYCERIN (NITROSTAT) 0.4 MG SL tablet Place 1 tablet (0.4 mg total) under the tongue every 5 (five) minutes as needed for chest pain.  25 tablet  3  . Tamsulosin HCl (FLOMAX) 0.4 MG CAPS Take 1 capsule (0.4 mg total) by mouth daily.  90 capsule  1   No current facility-administered medications for this visit.    OBJECTIVE: Elderly white male with a Band-Aid over his left 4 had and  some bruising over his left arm  Filed Vitals:   05/28/13 1309  BP: 125/64  Pulse: 68  Temp: 97.7 F (36.5 C)  Resp: 20     Body mass index is 25.57 kg/(m^2).    ECOG FS: 2 Filed Weights   05/28/13 1309  Weight: 178 lb 3.2 oz (80.831 kg)   Sclerae unicteric Oropharynx clear No peripheral adenopathy Lungs clear bilaterally  Heart regular rate and rhythm Abdomen  soft, nontender, positive bowel sounds MSK no focal spinal tenderness  including palpation of the lower spine; no peripheral edema  Neuro: nonfocal, well oriented, positive affect   LAB RESULTS:  Results for CURRAN, LENDERMAN (MRN [id number]) as of 05/28/2013 18:37  Ref. Range 03/26/2013 13:54 04/23/2013 13:58 05/12/2013 10:48  Kappa free light chain Latest Range: 0.33-1.94 mg/dL 40.98 (H) 11.91 (H) 47.82 (H)  Lambda Free Lght Chn Latest Range: 0.57-2.63 mg/dL 9.56 2.13 0.86  Kappa:Lambda Ratio Latest Range: 0.26-1.65  8.44 (H) 10.86 (H) 8.25 (H)    Lab Results  Component Value Date   WBC 7.1 05/12/2013   NEUTROABS 4.5 05/12/2013   HGB 9.9* 05/12/2013   HCT 30.0* 05/12/2013   MCV 89.3 05/12/2013   PLT 179 05/12/2013      Chemistry      Component Value Date/Time   NA 130* 05/12/2013 1048   NA 135 12/20/2012 1117   K 4.1 05/12/2013 1048   K 4.8 12/20/2012 1117   CL 98 05/12/2013 1048   CL 106 12/20/2012 1117   CO2 23 05/12/2013 1048   CO2 23 12/20/2012 1117   BUN 33.0* 05/12/2013 1048   BUN 49* 12/20/2012 1117   CREATININE 1.2 05/12/2013 1048   CREATININE 1.3 12/20/2012 1117      Component Value Date/Time   CALCIUM 9.6 05/12/2013 1048   CALCIUM 9.7 12/20/2012 1117   ALKPHOS 50 05/12/2013 1048   ALKPHOS 52 07/04/2012 1106   AST 21 05/12/2013 1048   AST 25 07/04/2012 1106   ALT 17 05/12/2013 1048   ALT 22 07/04/2012 1106   BILITOT 0.44 05/12/2013 1048   BILITOT 0.6 07/04/2012 1106      STUDIES: Mr Lumbar Spine W Wo Contrast  05/20/2013   *RADIOLOGY REPORT*  Clinical Data: Multiple myeloma.  Low back pain radiating down the left  leg.  MRI LUMBAR SPINE WITHOUT AND WITH CONTRAST  Technique:  Multiplanar and multiecho pulse sequences of the lumbar spine were obtained without and with intravenous contrast.  Contrast: 18mL MULTIHANCE GADOBENATE DIMEGLUMINE 529 MG/ML IV SOLN  Comparison: 07/17/2012 and 05/27/2012.  Findings: Levoconvex lumbar scoliosis noted. The lowest full intervertebral disk space is labeled L5-S1.  If procedural intervention is to be performed, careful correlation with this numbering strategy is recommended.  The conus medullaris appears unremarkable.  Conus level:  L1.  As before, there are multilevel mild subluxations with 3 mm posterior subluxation at L1-2, 4 mm posterior subluxation at L2-3, and 3 mm posterior subluxation at L3-4. Several T2 hyperintense, T1 hypointense renal lesions noted; the findings are statistically likely to represent cysts but are not fully evaluated on today's lumbar spine MRI.  Type 2 degenerative endplate findings noted at the L2-3 particularly eccentric to the right.  No enhancing bony lesions characteristic myeloma identified.  Additional findings at individual levels are as follows:  L1-2:  No impingement.  Disc bulge and mild facet arthropathy.  L2-3:  Moderate right subarticular lateral recess stenosis and mild to moderate right foraminal stenosis secondary to facet arthropathy and right eccentric disc osteophyte complex.  Mild displacement of right L2 nerve in the lateral extraforaminal space due to disc osteophyte complex.  L3-4:  Prominent right and mild left foraminal stenosis with moderate right and mild left subarticular lateral recess stenosis secondary to disc osteophyte complex and facet arthropathy.  Degree of right foraminal stenosis mildly increased compared to prior.  L4-5:  Mild bilateral foraminal stenosis due to facet and intervertebral spurring, stable.  Prior right laminectomy.  Similar appearance to prior.  L5-S1:  Mild left foraminal  stenosis due to synovial cyst and  facet arthropathy as on prior.  IMPRESSION:  1.  Lumbar spondylosis, scoliosis, and degenerative disc disease causing prominent impingement L3-4, moderate impingement at L2-3, and mild impingement at L4-5 at L5-S1 as detailed above. Impingement at L3-4 has worsened in the interim. 2. Several T2 hyperintense, T1 hypointense renal lesions noted; the findings are statistically likely to represent cysts but are not fully evaluated on today's lumbar spine MRI.   Original Report Authenticated By: Gaylyn Rong, M.D.     ASSESSMENT: 79 y.o.  with kappa light chain multiple myeloma  (1) presenting April 2007 with anemia and renal failure; renal Bx showing free kappa light chain deposition; bone marrow biopsy showing 37% plasma cells, with normal cytogenetics and FISH;bone survey showing skull lytic lesions; treated with  (2) thalidomide (200 mg/d) and dexamethasone (40 mg/d x4d Q28d) June through Nov 2007, with good response, but poor tolerance;  (3) thalidomide decreased to 100 mg/d, dexamethasone continued, bortezomib (IV) added, Dec 2007 to March 2008  (4) treatment interrupted by multiple complications (peripheral neuropathy, pulmonary embolism, diverticular abscess, CN V zoster, severe DDD, congestive heart failure, rising PSA); maintenance zolendronic acid through March 2012  (5) progression April 2012, treated initially with dexamethasone alone, poorly tolerated;  (6) bortezomib (sq) resumed July 2012; cyclophosphamide and dexamethasone added Sept 2012; zolendronic acid changed to Q 3months; all treatments held as of March 2013 due to the development of the Left foot ulcer described above (7) anemia, on aranesp December 2012 to August 2013 without significant response    PLAN:   I reviewed takes a myeloma labs with him and Timothy [name] extensively today. His hemoglobin has been stable for several months. His creatinine is now as good as it gets for him. His kappa light chains are not significantly  elevated and the ratio with a lambda light chains is if anything down. The MRI of the spine shows significant degenerative disease but no evidence of myeloma. In short I believe his myeloma continues to be controlled.   We certainly have treatment for him, namely carfilzomib, which I think would be effective and not worsen his peripheral neuropathy. If he had focal involvement of the bone we could certainly irradiate that. However I don't think either of these treatments would make any difference at present nor do I think going back to aranesp would make any difference to his functional status.  He is going to continue on Dilantin as needed, which she is tolerating well and does help him be a little bit more active. It is not constipating her confusing. We're going to continue to follow labwork every month. He will see [place] again in 3 months or earlier if there are new symptoms. He knows to call for any problems that may develop before the next visit here  Audi Conover C    05/28/2013

## 2013-05-30 ENCOUNTER — Other Ambulatory Visit: Payer: Self-pay | Admitting: *Deleted

## 2013-05-30 DIAGNOSIS — C9000 Multiple myeloma not having achieved remission: Secondary | ICD-10-CM

## 2013-05-30 MED ORDER — HYDROMORPHONE HCL 4 MG PO TABS: 4.0000 mg | ORAL_TABLET | ORAL | Status: DC | PRN

## 2013-06-04 ENCOUNTER — Emergency Department (HOSPITAL_COMMUNITY): Payer: No Typology Code available for payment source

## 2013-06-04 ENCOUNTER — Other Ambulatory Visit: Payer: Self-pay | Admitting: *Deleted

## 2013-06-04 ENCOUNTER — Emergency Department (HOSPITAL_COMMUNITY)
Admission: EM | Admit: 2013-06-04 | Discharge: 2013-06-04 | Disposition: A | Discharge status: Home / Self Care | Payer: No Typology Code available for payment source | Attending: Emergency Medicine | Admitting: Emergency Medicine

## 2013-06-04 ENCOUNTER — Encounter (HOSPITAL_COMMUNITY): Payer: Self-pay | Admitting: Emergency Medicine

## 2013-06-04 DIAGNOSIS — R011 Cardiac murmur, unspecified: Secondary | ICD-10-CM | POA: Insufficient documentation

## 2013-06-04 DIAGNOSIS — M109 Gout, unspecified: Secondary | ICD-10-CM | POA: Insufficient documentation

## 2013-06-04 DIAGNOSIS — M549 Dorsalgia, unspecified: Secondary | ICD-10-CM | POA: Insufficient documentation

## 2013-06-04 DIAGNOSIS — C9000 Multiple myeloma not having achieved remission: Secondary | ICD-10-CM

## 2013-06-04 DIAGNOSIS — I10 Essential (primary) hypertension: Secondary | ICD-10-CM | POA: Insufficient documentation

## 2013-06-04 DIAGNOSIS — Z8639 Personal history of other endocrine, nutritional and metabolic disease: Secondary | ICD-10-CM | POA: Insufficient documentation

## 2013-06-04 DIAGNOSIS — S62102A Fracture of unspecified carpal bone, left wrist, initial encounter for closed fracture: Secondary | ICD-10-CM

## 2013-06-04 DIAGNOSIS — I2510 Atherosclerotic heart disease of native coronary artery without angina pectoris: Secondary | ICD-10-CM | POA: Insufficient documentation

## 2013-06-04 DIAGNOSIS — S52599A Other fractures of lower end of unspecified radius, initial encounter for closed fracture: Secondary | ICD-10-CM | POA: Insufficient documentation

## 2013-06-04 DIAGNOSIS — Z79899 Other long term (current) drug therapy: Secondary | ICD-10-CM | POA: Insufficient documentation

## 2013-06-04 DIAGNOSIS — G8929 Other chronic pain: Secondary | ICD-10-CM | POA: Insufficient documentation

## 2013-06-04 DIAGNOSIS — Y92410 Unspecified street and highway as the place of occurrence of the external cause: Secondary | ICD-10-CM | POA: Insufficient documentation

## 2013-06-04 DIAGNOSIS — Z8679 Personal history of other diseases of the circulatory system: Secondary | ICD-10-CM | POA: Insufficient documentation

## 2013-06-04 DIAGNOSIS — I5022 Chronic systolic (congestive) heart failure: Secondary | ICD-10-CM | POA: Insufficient documentation

## 2013-06-04 DIAGNOSIS — Z7982 Long term (current) use of aspirin: Secondary | ICD-10-CM | POA: Insufficient documentation

## 2013-06-04 DIAGNOSIS — I252 Old myocardial infarction: Secondary | ICD-10-CM | POA: Insufficient documentation

## 2013-06-04 DIAGNOSIS — Z951 Presence of aortocoronary bypass graft: Secondary | ICD-10-CM | POA: Insufficient documentation

## 2013-06-04 DIAGNOSIS — Z8619 Personal history of other infectious and parasitic diseases: Secondary | ICD-10-CM | POA: Insufficient documentation

## 2013-06-04 DIAGNOSIS — Z8669 Personal history of other diseases of the nervous system and sense organs: Secondary | ICD-10-CM | POA: Insufficient documentation

## 2013-06-04 DIAGNOSIS — Z87448 Personal history of other diseases of urinary system: Secondary | ICD-10-CM | POA: Insufficient documentation

## 2013-06-04 DIAGNOSIS — Z87891 Personal history of nicotine dependence: Secondary | ICD-10-CM | POA: Insufficient documentation

## 2013-06-04 DIAGNOSIS — Z8739 Personal history of other diseases of the musculoskeletal system and connective tissue: Secondary | ICD-10-CM | POA: Insufficient documentation

## 2013-06-04 DIAGNOSIS — Z9861 Coronary angioplasty status: Secondary | ICD-10-CM | POA: Insufficient documentation

## 2013-06-04 DIAGNOSIS — Y9389 Activity, other specified: Secondary | ICD-10-CM | POA: Insufficient documentation

## 2013-06-04 DIAGNOSIS — S98139A Complete traumatic amputation of one unspecified lesser toe, initial encounter: Secondary | ICD-10-CM | POA: Insufficient documentation

## 2013-06-04 DIAGNOSIS — E785 Hyperlipidemia, unspecified: Secondary | ICD-10-CM | POA: Insufficient documentation

## 2013-06-04 DIAGNOSIS — D649 Anemia, unspecified: Secondary | ICD-10-CM | POA: Insufficient documentation

## 2013-06-04 MED ORDER — HYDROMORPHONE HCL 4 MG PO TABS: 4.0000 mg | ORAL_TABLET | ORAL | Status: DC | PRN

## 2013-06-04 NOTE — Telephone Encounter (Signed)
Patient wife calling for refill on pain meds, getting low on supply and will need #30 for local fill, then one for mail order. Rx signed into narcotic book.
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
## 2013-06-05 NOTE — ED Provider Notes (Signed)
Medical screening examination/treatment/procedure(s) were performed by non-physician practitioner and as supervising physician I was immediately available for consultation/collaboration.  Haddy Mullinax T Beulah Matusek, MD 06/05/13 1645 

## 2013-06-09 ENCOUNTER — Telehealth: Payer: Self-pay | Admitting: Cardiology

## 2013-06-09 NOTE — Telephone Encounter (Signed)
New problem   Karen/[facility] fax over medical clearance for pt to have sx on Wednesday 06/11/13. Please fax by ASAP to [phone].

## 2013-06-09 NOTE — Telephone Encounter (Signed)
Left message for karen, made aware dr Swaziland has not been here. Back in the office tomorrow. Will forward to cheryl pugh.

## 2013-06-10 NOTE — Telephone Encounter (Signed)
Received surgical clearance form from [facility].Dr.Jordan cleared patient for surgery,form signed by Dr.Jordan and faxed to fax # [phone].

## 2013-06-13 ENCOUNTER — Other Ambulatory Visit: Payer: Self-pay | Admitting: *Deleted

## 2013-06-13 ENCOUNTER — Telehealth: Payer: Self-pay | Admitting: Oncology

## 2013-06-13 ENCOUNTER — Encounter: Payer: Self-pay | Admitting: Physician Assistant

## 2013-06-13 NOTE — Telephone Encounter (Signed)
Per 7/25 pof moved 7/30 lb to 7/29. S/w pt re change and new appt for 7/29 @ 10:45am.

## 2013-06-17 ENCOUNTER — Other Ambulatory Visit (HOSPITAL_BASED_OUTPATIENT_CLINIC_OR_DEPARTMENT_OTHER): Payer: Medicare Other

## 2013-06-17 DIAGNOSIS — C9000 Multiple myeloma not having achieved remission: Secondary | ICD-10-CM

## 2013-06-17 LAB — CBC WITH DIFFERENTIAL/PLATELET
Eosinophils Absolute: 0.1 10*3/uL (ref 0.0–0.5)
HCT: 25 % — ABNORMAL LOW (ref 38.4–49.9)
LYMPH%: 19.8 % (ref 14.0–49.0)
MONO#: 0.8 10*3/uL (ref 0.1–0.9)
NEUT#: 4.4 10*3/uL (ref 1.5–6.5)
NEUT%: 66 % (ref 39.0–75.0)
Platelets: 205 10*3/uL (ref 140–400)
WBC: 6.6 10*3/uL (ref 4.0–10.3)

## 2013-06-17 LAB — COMPREHENSIVE METABOLIC PANEL (CC13)
Albumin: 3.1 g/dL — ABNORMAL LOW (ref 3.5–5.0)
CO2: 23 mEq/L (ref 22–29)
Calcium: 9.2 mg/dL (ref 8.4–10.4)
Chloride: 97 mEq/L — ABNORMAL LOW (ref 98–109)
Glucose: 101 mg/dl (ref 70–140)
Sodium: 127 mEq/L — ABNORMAL LOW (ref 136–145)
Total Bilirubin: 0.55 mg/dL (ref 0.20–1.20)
Total Protein: 6.4 g/dL (ref 6.4–8.3)

## 2013-06-17 LAB — PROTEIN / CREATININE RATIO, URINE
Creatinine, Urine: 60 mg/dL
Total Protein, Urine: 7 mg/dL

## 2013-06-18 ENCOUNTER — Other Ambulatory Visit: Payer: Medicare Other

## 2013-06-18 ENCOUNTER — Telehealth: Payer: Self-pay | Admitting: *Deleted

## 2013-06-18 LAB — KAPPA/LAMBDA LIGHT CHAINS: Kappa free light chain: 35.5 mg/dL — ABNORMAL HIGH (ref 0.33–1.94)

## 2013-06-18 NOTE — Telephone Encounter (Signed)
Rec'd notification that patient has rec'd Percocet 10/325 from Dr Bradly Bienenstock in addition to the routine Dilaudid that is dispensed by this office. Dr Darnelle Catalan is aware of recent bone fracture and Ortho Consult. Will continue to monitor this.

## 2013-06-25 ENCOUNTER — Other Ambulatory Visit: Payer: Self-pay

## 2013-06-28 ENCOUNTER — Other Ambulatory Visit: Payer: Self-pay | Admitting: Nurse Practitioner

## 2013-06-30 ENCOUNTER — Other Ambulatory Visit: Payer: Self-pay | Admitting: *Deleted

## 2013-06-30 MED ORDER — ALLOPURINOL 300 MG PO TABS: 150.0000 mg | ORAL_TABLET | Freq: Every day | ORAL | Status: DC

## 2013-06-30 NOTE — Telephone Encounter (Signed)
S/w pt to clarify dose of allpurinol pt stated taking ( 150 mg ) one half tablet daily [facility] was sent [facility] today

## 2013-07-04 ENCOUNTER — Other Ambulatory Visit: Payer: Self-pay | Admitting: Nurse Practitioner

## 2013-07-06 ENCOUNTER — Other Ambulatory Visit: Payer: Self-pay | Admitting: Oncology

## 2013-07-07 ENCOUNTER — Other Ambulatory Visit: Payer: Self-pay | Admitting: *Deleted

## 2013-07-15 ENCOUNTER — Encounter: Payer: Self-pay | Admitting: Physician Assistant

## 2013-07-16 ENCOUNTER — Other Ambulatory Visit (HOSPITAL_BASED_OUTPATIENT_CLINIC_OR_DEPARTMENT_OTHER): Payer: Medicare Other

## 2013-07-16 ENCOUNTER — Telehealth: Payer: Self-pay

## 2013-07-16 DIAGNOSIS — D649 Anemia, unspecified: Secondary | ICD-10-CM

## 2013-07-16 DIAGNOSIS — I255 Ischemic cardiomyopathy: Secondary | ICD-10-CM

## 2013-07-16 DIAGNOSIS — C9000 Multiple myeloma not having achieved remission: Secondary | ICD-10-CM

## 2013-07-16 LAB — COMPREHENSIVE METABOLIC PANEL (CC13)
AST: 22 U/L (ref 5–34)
Albumin: 3.3 g/dL — ABNORMAL LOW (ref 3.5–5.0)
Alkaline Phosphatase: 59 U/L (ref 40–150)
BUN: 20.2 mg/dL (ref 7.0–26.0)
Creatinine: 1.1 mg/dL (ref 0.7–1.3)
Glucose: 97 mg/dl (ref 70–140)

## 2013-07-16 LAB — CBC WITH DIFFERENTIAL/PLATELET
BASO%: 0.4 % (ref 0.0–2.0)
Eosinophils Absolute: 0.1 10*3/uL (ref 0.0–0.5)
MCHC: 32.1 g/dL (ref 32.0–36.0)
MONO#: 0.7 10*3/uL (ref 0.1–0.9)
NEUT#: 3.3 10*3/uL (ref 1.5–6.5)
Platelets: 160 10*3/uL (ref 140–400)
RBC: 2.92 10*6/uL — ABNORMAL LOW (ref 4.20–5.82)
RDW: 16.3 % — ABNORMAL HIGH (ref 11.0–14.6)
WBC: 5.6 10*3/uL (ref 4.0–10.3)
lymph#: 1.5 10*3/uL (ref 0.9–3.3)

## 2013-07-16 LAB — PROTEIN / CREATININE RATIO, URINE
Creatinine, Urine: 68.9 mg/dL
Total Protein, Urine: 5 mg/dL

## 2013-07-16 NOTE — Telephone Encounter (Signed)
Spoke to patient's wife was told Dr.Jordan received a message from Dr.Magrinat patient qualifies for a study but needs a echo before he can do study.Schedulers will be calling to schedule a echo.

## 2013-07-17 LAB — KAPPA/LAMBDA LIGHT CHAINS
Kappa free light chain: 28 mg/dL — ABNORMAL HIGH (ref 0.33–1.94)
Kappa:Lambda Ratio: 11.81 — ABNORMAL HIGH (ref 0.26–1.65)
Lambda Free Lght Chn: 2.37 mg/dL (ref 0.57–2.63)

## 2013-07-18 ENCOUNTER — Ambulatory Visit (HOSPITAL_COMMUNITY): Payer: Medicare Other | Attending: Oncology | Admitting: Radiology

## 2013-07-18 DIAGNOSIS — I2589 Other forms of chronic ischemic heart disease: Secondary | ICD-10-CM | POA: Insufficient documentation

## 2013-07-18 DIAGNOSIS — I255 Ischemic cardiomyopathy: Secondary | ICD-10-CM

## 2013-07-18 DIAGNOSIS — I079 Rheumatic tricuspid valve disease, unspecified: Secondary | ICD-10-CM | POA: Insufficient documentation

## 2013-07-18 DIAGNOSIS — C9000 Multiple myeloma not having achieved remission: Secondary | ICD-10-CM | POA: Insufficient documentation

## 2013-07-18 DIAGNOSIS — I10 Essential (primary) hypertension: Secondary | ICD-10-CM | POA: Insufficient documentation

## 2013-07-18 DIAGNOSIS — I379 Nonrheumatic pulmonary valve disorder, unspecified: Secondary | ICD-10-CM | POA: Insufficient documentation

## 2013-07-18 DIAGNOSIS — R011 Cardiac murmur, unspecified: Secondary | ICD-10-CM | POA: Insufficient documentation

## 2013-07-18 DIAGNOSIS — Z9221 Personal history of antineoplastic chemotherapy: Secondary | ICD-10-CM | POA: Insufficient documentation

## 2013-07-18 DIAGNOSIS — I509 Heart failure, unspecified: Secondary | ICD-10-CM | POA: Insufficient documentation

## 2013-07-18 DIAGNOSIS — I059 Rheumatic mitral valve disease, unspecified: Secondary | ICD-10-CM | POA: Insufficient documentation

## 2013-07-18 NOTE — Progress Notes (Signed)
Echocardiogram performed.  

## 2013-07-22 ENCOUNTER — Other Ambulatory Visit: Payer: Self-pay | Admitting: Oncology

## 2013-07-24 ENCOUNTER — Ambulatory Visit (INDEPENDENT_AMBULATORY_CARE_PROVIDER_SITE_OTHER): Payer: Medicare Other | Admitting: Cardiology

## 2013-07-24 ENCOUNTER — Encounter: Payer: Self-pay | Admitting: Cardiology

## 2013-07-24 VITALS — BP 115/52 | HR 64 | Ht 70.0 in | Wt 184.0 lb

## 2013-07-24 DIAGNOSIS — I5022 Chronic systolic (congestive) heart failure: Secondary | ICD-10-CM

## 2013-07-24 DIAGNOSIS — I509 Heart failure, unspecified: Secondary | ICD-10-CM

## 2013-07-24 DIAGNOSIS — I255 Ischemic cardiomyopathy: Secondary | ICD-10-CM

## 2013-07-24 DIAGNOSIS — R0602 Shortness of breath: Secondary | ICD-10-CM

## 2013-07-24 DIAGNOSIS — Z951 Presence of aortocoronary bypass graft: Secondary | ICD-10-CM

## 2013-07-24 DIAGNOSIS — I2510 Atherosclerotic heart disease of native coronary artery without angina pectoris: Secondary | ICD-10-CM

## 2013-07-24 DIAGNOSIS — I2589 Other forms of chronic ischemic heart disease: Secondary | ICD-10-CM

## 2013-07-24 DIAGNOSIS — IMO0001 Reserved for inherently not codable concepts without codable children: Secondary | ICD-10-CM

## 2013-07-24 MED ORDER — ATORVASTATIN CALCIUM 10 MG PO TABS: 10.0000 mg | ORAL_TABLET | Freq: Every day | ORAL | Status: DC

## 2013-07-24 NOTE — Patient Instructions (Addendum)
Take Lasix 20 mg a day for one week then as needed for increased swelling.   Continue sodium restriction.  Continue your other medication except reduce lipitor to 10 mg daily  I will see you in 4 months
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
## 2013-08-11 ENCOUNTER — Telehealth: Payer: Self-pay | Admitting: *Deleted

## 2013-08-11 ENCOUNTER — Encounter: Payer: Self-pay | Admitting: Physician Assistant

## 2013-08-11 NOTE — Telephone Encounter (Signed)
MyChart message received from patient's wife requesting a cholesterol level be checked on 08-13-2013 for Dr. Swaziland since patient will be coming here for a scheduled lab appointment.  Called Mr. Dartt and informed him Dr. Swaziland will need to order the lab.  If our lab department can view the order we'll be glad to draw the test.  Informed him the alternative is to bring a script with the request.

## 2013-08-13 ENCOUNTER — Other Ambulatory Visit: Payer: Self-pay | Admitting: *Deleted

## 2013-08-13 ENCOUNTER — Other Ambulatory Visit (HOSPITAL_BASED_OUTPATIENT_CLINIC_OR_DEPARTMENT_OTHER): Payer: Medicare Other

## 2013-08-13 DIAGNOSIS — I2510 Atherosclerotic heart disease of native coronary artery without angina pectoris: Secondary | ICD-10-CM

## 2013-08-13 DIAGNOSIS — C9000 Multiple myeloma not having achieved remission: Secondary | ICD-10-CM

## 2013-08-13 DIAGNOSIS — D649 Anemia, unspecified: Secondary | ICD-10-CM

## 2013-08-13 LAB — COMPREHENSIVE METABOLIC PANEL (CC13)
AST: 22 U/L (ref 5–34)
Albumin: 3.6 g/dL (ref 3.5–5.0)
BUN: 20.8 mg/dL (ref 7.0–26.0)
Calcium: 9.3 mg/dL (ref 8.4–10.4)
Chloride: 101 mEq/L (ref 98–109)
Glucose: 124 mg/dl (ref 70–140)
Potassium: 4.7 mEq/L (ref 3.5–5.1)
Sodium: 131 mEq/L — ABNORMAL LOW (ref 136–145)
Total Bilirubin: 0.58 mg/dL (ref 0.20–1.20)
Total Protein: 6.8 g/dL (ref 6.4–8.3)

## 2013-08-13 LAB — CBC WITH DIFFERENTIAL/PLATELET
BASO%: 1 % (ref 0.0–2.0)
LYMPH%: 29.6 % (ref 14.0–49.0)
MCH: 30.3 pg (ref 27.2–33.4)
MCHC: 33.5 g/dL (ref 32.0–36.0)
MCV: 90.4 fL (ref 79.3–98.0)
MONO#: 0.5 10*3/uL (ref 0.1–0.9)
MONO%: 8.8 % (ref 0.0–14.0)
NEUT#: 3.4 10*3/uL (ref 1.5–6.5)
NEUT%: 58.6 % (ref 39.0–75.0)
Platelets: 167 10*3/uL (ref 140–400)
RBC: 3.31 10*6/uL — ABNORMAL LOW (ref 4.20–5.82)
RDW: 17 % — ABNORMAL HIGH (ref 11.0–14.6)
WBC: 5.8 10*3/uL (ref 4.0–10.3)

## 2013-08-13 LAB — LIPID PANEL
HDL: 37 mg/dL — ABNORMAL LOW (ref >39)
LDL Cholesterol: 50 mg/dL (ref 0–99)
Triglycerides: 75 mg/dL (ref <150)

## 2013-08-14 LAB — KAPPA/LAMBDA LIGHT CHAINS
Kappa free light chain: 27.1 mg/dL — ABNORMAL HIGH (ref 0.33–1.94)
Lambda Free Lght Chn: 2.43 mg/dL (ref 0.57–2.63)

## 2013-08-16 ENCOUNTER — Encounter (HOSPITAL_COMMUNITY): Payer: Self-pay | Admitting: *Deleted

## 2013-08-16 ENCOUNTER — Emergency Department (HOSPITAL_COMMUNITY): Payer: Medicare Other

## 2013-08-16 ENCOUNTER — Inpatient Hospital Stay (HOSPITAL_COMMUNITY)
Admission: EM | Admit: 2013-08-16 | Discharge: 2013-08-21 | DRG: 481 | Disposition: A | Discharge status: Rehabilitation Facility | Payer: Medicare Other | Attending: Internal Medicine | Admitting: Internal Medicine

## 2013-08-16 DIAGNOSIS — I10 Essential (primary) hypertension: Secondary | ICD-10-CM | POA: Diagnosis present

## 2013-08-16 DIAGNOSIS — S72141A Displaced intertrochanteric fracture of right femur, initial encounter for closed fracture: Secondary | ICD-10-CM

## 2013-08-16 DIAGNOSIS — E785 Hyperlipidemia, unspecified: Secondary | ICD-10-CM | POA: Diagnosis present

## 2013-08-16 DIAGNOSIS — Z79899 Other long term (current) drug therapy: Secondary | ICD-10-CM

## 2013-08-16 DIAGNOSIS — S91302S Unspecified open wound, left foot, sequela: Secondary | ICD-10-CM

## 2013-08-16 DIAGNOSIS — Z87891 Personal history of nicotine dependence: Secondary | ICD-10-CM

## 2013-08-16 DIAGNOSIS — D62 Acute posthemorrhagic anemia: Secondary | ICD-10-CM | POA: Diagnosis not present

## 2013-08-16 DIAGNOSIS — N401 Enlarged prostate with lower urinary tract symptoms: Secondary | ICD-10-CM | POA: Diagnosis present

## 2013-08-16 DIAGNOSIS — D649 Anemia, unspecified: Secondary | ICD-10-CM

## 2013-08-16 DIAGNOSIS — D72829 Elevated white blood cell count, unspecified: Secondary | ICD-10-CM | POA: Diagnosis present

## 2013-08-16 DIAGNOSIS — I5023 Acute on chronic systolic (congestive) heart failure: Secondary | ICD-10-CM

## 2013-08-16 DIAGNOSIS — C9000 Multiple myeloma not having achieved remission: Secondary | ICD-10-CM | POA: Diagnosis present

## 2013-08-16 DIAGNOSIS — G8929 Other chronic pain: Secondary | ICD-10-CM | POA: Diagnosis present

## 2013-08-16 DIAGNOSIS — Y831 Surgical operation with implant of artificial internal device as the cause of abnormal reaction of the patient, or of later complication, without mention of misadventure at the time of the procedure: Secondary | ICD-10-CM | POA: Diagnosis not present

## 2013-08-16 DIAGNOSIS — W1789XA Other fall from one level to another, initial encounter: Secondary | ICD-10-CM | POA: Diagnosis present

## 2013-08-16 DIAGNOSIS — N138 Other obstructive and reflux uropathy: Secondary | ICD-10-CM | POA: Diagnosis present

## 2013-08-16 DIAGNOSIS — M109 Gout, unspecified: Secondary | ICD-10-CM | POA: Diagnosis present

## 2013-08-16 DIAGNOSIS — M545 Low back pain, unspecified: Secondary | ICD-10-CM | POA: Diagnosis present

## 2013-08-16 DIAGNOSIS — G609 Hereditary and idiopathic neuropathy, unspecified: Secondary | ICD-10-CM | POA: Diagnosis present

## 2013-08-16 DIAGNOSIS — Z23 Encounter for immunization: Secondary | ICD-10-CM

## 2013-08-16 DIAGNOSIS — IMO0001 Reserved for inherently not codable concepts without codable children: Secondary | ICD-10-CM

## 2013-08-16 DIAGNOSIS — N289 Disorder of kidney and ureter, unspecified: Secondary | ICD-10-CM

## 2013-08-16 DIAGNOSIS — S98139A Complete traumatic amputation of one unspecified lesser toe, initial encounter: Secondary | ICD-10-CM

## 2013-08-16 DIAGNOSIS — E871 Hypo-osmolality and hyponatremia: Secondary | ICD-10-CM | POA: Diagnosis present

## 2013-08-16 DIAGNOSIS — I2510 Atherosclerotic heart disease of native coronary artery without angina pectoris: Secondary | ICD-10-CM

## 2013-08-16 DIAGNOSIS — L97529 Non-pressure chronic ulcer of other part of left foot with unspecified severity: Secondary | ICD-10-CM

## 2013-08-16 DIAGNOSIS — I255 Ischemic cardiomyopathy: Secondary | ICD-10-CM

## 2013-08-16 DIAGNOSIS — I509 Heart failure, unspecified: Secondary | ICD-10-CM | POA: Diagnosis present

## 2013-08-16 DIAGNOSIS — S72141D Displaced intertrochanteric fracture of right femur, subsequent encounter for closed fracture with routine healing: Secondary | ICD-10-CM

## 2013-08-16 DIAGNOSIS — S72143A Displaced intertrochanteric fracture of unspecified femur, initial encounter for closed fracture: Principal | ICD-10-CM | POA: Diagnosis present

## 2013-08-16 DIAGNOSIS — M869 Osteomyelitis, unspecified: Secondary | ICD-10-CM

## 2013-08-16 DIAGNOSIS — Z7982 Long term (current) use of aspirin: Secondary | ICD-10-CM

## 2013-08-16 DIAGNOSIS — I252 Old myocardial infarction: Secondary | ICD-10-CM

## 2013-08-16 DIAGNOSIS — I2589 Other forms of chronic ischemic heart disease: Secondary | ICD-10-CM | POA: Diagnosis present

## 2013-08-16 DIAGNOSIS — Z951 Presence of aortocoronary bypass graft: Secondary | ICD-10-CM

## 2013-08-16 DIAGNOSIS — E876 Hypokalemia: Secondary | ICD-10-CM

## 2013-08-16 DIAGNOSIS — R339 Retention of urine, unspecified: Secondary | ICD-10-CM | POA: Diagnosis not present

## 2013-08-16 DIAGNOSIS — I5022 Chronic systolic (congestive) heart failure: Secondary | ICD-10-CM | POA: Diagnosis present

## 2013-08-16 LAB — COMPREHENSIVE METABOLIC PANEL
ALT: 16 U/L (ref 0–53)
AST: 25 U/L (ref 0–37)
Alkaline Phosphatase: 52 U/L (ref 39–117)
CO2: 23 mEq/L (ref 19–32)
Chloride: 98 mEq/L (ref 96–112)
GFR calc non Af Amer: 61 mL/min — ABNORMAL LOW (ref >90)
Glucose, Bld: 108 mg/dL — ABNORMAL HIGH (ref 70–99)
Sodium: 132 mEq/L — ABNORMAL LOW (ref 135–145)
Total Bilirubin: 0.4 mg/dL (ref 0.3–1.2)

## 2013-08-16 LAB — CBC WITH DIFFERENTIAL/PLATELET
Basophils Absolute: 0 10*3/uL (ref 0.0–0.1)
HCT: 27.9 % — ABNORMAL LOW (ref 39.0–52.0)
Hemoglobin: 9.3 g/dL — ABNORMAL LOW (ref 13.0–17.0)
Lymphocytes Relative: 14 % (ref 12–46)
Lymphs Abs: 2 10*3/uL (ref 0.7–4.0)
MCH: 29.8 pg (ref 26.0–34.0)
Neutro Abs: 11.6 10*3/uL — ABNORMAL HIGH (ref 1.7–7.7)
Platelets: 177 10*3/uL (ref 150–400)
RBC: 3.12 MIL/uL — ABNORMAL LOW (ref 4.22–5.81)
RDW: 15.8 % — ABNORMAL HIGH (ref 11.5–15.5)
WBC: 15 10*3/uL — ABNORMAL HIGH (ref 4.0–10.5)

## 2013-08-16 MED ORDER — ATORVASTATIN CALCIUM 10 MG PO TABS
10.0000 mg | ORAL_TABLET | Freq: Every day | ORAL | Status: DC
  Administered 2013-08-16 – 2013-08-21 (×6): 10 mg via ORAL
  Filled 2013-08-16 (×6): qty 1

## 2013-08-16 MED ORDER — HYDROCODONE-ACETAMINOPHEN 5-325 MG PO TABS: 1.0000 | ORAL_TABLET | Freq: Four times a day (QID) | ORAL | Status: DC | PRN

## 2013-08-16 MED ORDER — ALLOPURINOL 150 MG HALF TABLET
150.0000 mg | ORAL_TABLET | Freq: Every day | ORAL | Status: DC
  Administered 2013-08-17 – 2013-08-21 (×5): 150 mg via ORAL
  Filled 2013-08-16 (×6): qty 1

## 2013-08-16 MED ORDER — HYDROMORPHONE HCL PF 2 MG/ML IJ SOLN
2.0000 mg | Freq: Once | INTRAMUSCULAR | Status: AC
  Administered 2013-08-16: 2 mg via INTRAVENOUS

## 2013-08-16 MED ORDER — MORPHINE SULFATE 2 MG/ML IJ SOLN: 0.5000 mg | INTRAMUSCULAR | Status: DC | PRN

## 2013-08-16 MED ORDER — TETANUS-DIPHTH-ACELL PERTUSSIS 5-2.5-18.5 LF-MCG/0.5 IM SUSP
0.5000 mL | Freq: Once | INTRAMUSCULAR | Status: AC
  Administered 2013-08-16: 0.5 mL via INTRAMUSCULAR
  Filled 2013-08-16: qty 0.5

## 2013-08-16 MED ORDER — HYDROMORPHONE HCL PF 2 MG/ML IJ SOLN
2.0000 mg | Freq: Once | INTRAMUSCULAR | Status: AC
  Administered 2013-08-16: 2 mg via INTRAMUSCULAR
  Filled 2013-08-16: qty 1

## 2013-08-16 MED ORDER — DIPHENHYDRAMINE HCL 50 MG/ML IJ SOLN
12.5000 mg | Freq: Once | INTRAMUSCULAR | Status: AC
  Administered 2013-08-16: 12.5 mg via INTRAVENOUS
  Filled 2013-08-16: qty 1

## 2013-08-16 MED ORDER — ASPIRIN EC 325 MG PO TBEC
325.0000 mg | DELAYED_RELEASE_TABLET | Freq: Every day | ORAL | Status: DC
  Administered 2013-08-16 – 2013-08-17 (×2): 325 mg via ORAL
  Filled 2013-08-16 (×2): qty 1

## 2013-08-16 MED ORDER — SODIUM CHLORIDE 0.9 % IV SOLN
INTRAVENOUS | Status: DC
  Administered 2013-08-16: 18:00:00 via INTRAVENOUS

## 2013-08-16 MED ORDER — HYDROMORPHONE HCL PF 2 MG/ML IJ SOLN
2.0000 mg | Freq: Once | INTRAMUSCULAR | Status: DC
  Filled 2013-08-16: qty 1

## 2013-08-16 MED ORDER — CARVEDILOL 6.25 MG PO TABS
6.2500 mg | ORAL_TABLET | Freq: Two times a day (BID) | ORAL | Status: DC
  Administered 2013-08-17 – 2013-08-21 (×9): 6.25 mg via ORAL
  Filled 2013-08-16 (×11): qty 1

## 2013-08-16 MED ORDER — LISINOPRIL 20 MG PO TABS
20.0000 mg | ORAL_TABLET | Freq: Every evening | ORAL | Status: DC
  Administered 2013-08-16 – 2013-08-20 (×5): 20 mg via ORAL
  Filled 2013-08-16 (×6): qty 1

## 2013-08-16 MED ORDER — VITAMIN C 500 MG PO TABS
500.0000 mg | ORAL_TABLET | Freq: Every morning | ORAL | Status: DC
  Administered 2013-08-17 – 2013-08-21 (×5): 500 mg via ORAL
  Filled 2013-08-16 (×5): qty 1

## 2013-08-16 MED ORDER — DULOXETINE HCL 30 MG PO CPEP
30.0000 mg | ORAL_CAPSULE | Freq: Every evening | ORAL | Status: DC
  Administered 2013-08-16 – 2013-08-20 (×5): 30 mg via ORAL
  Filled 2013-08-16 (×6): qty 1

## 2013-08-16 MED ORDER — INFLUENZA VAC SPLIT QUAD 0.5 ML IM SUSP
0.5000 mL | INTRAMUSCULAR | Status: AC
  Administered 2013-08-17: 0.5 mL via INTRAMUSCULAR
  Filled 2013-08-16 (×2): qty 0.5

## 2013-08-16 MED ORDER — HYDROMORPHONE HCL PF 1 MG/ML IJ SOLN
0.5000 mg | INTRAMUSCULAR | Status: DC | PRN
  Administered 2013-08-16 – 2013-08-17 (×2): 1 mg via INTRAVENOUS
  Filled 2013-08-16 (×2): qty 1

## 2013-08-16 MED ORDER — HYDROMORPHONE HCL PF 2 MG/ML IJ SOLN
2.0000 mg | Freq: Once | INTRAMUSCULAR | Status: AC
  Administered 2013-08-16: 2 mg via INTRAVENOUS
  Filled 2013-08-16: qty 1

## 2013-08-16 MED ORDER — TAMSULOSIN HCL 0.4 MG PO CAPS
0.4000 mg | ORAL_CAPSULE | Freq: Every day | ORAL | Status: DC
  Administered 2013-08-16 – 2013-08-20 (×4): 0.4 mg via ORAL
  Filled 2013-08-16 (×6): qty 1

## 2013-08-16 NOTE — ED Notes (Signed)
Pt from home, wife brought pt to ED - pt tripped and fell while getting off lawn-mower. Pain to right hip

## 2013-08-16 NOTE — ED Provider Notes (Signed)
CSN: [id number]     Arrival date & time 08/16/13  1713 History   First MD Initiated Contact with Patient 08/16/13 1723     Chief Complaint  Patient presents with  . Hip Injury   (Consider location/radiation/quality/duration/timing/severity/associated sxs/prior Treatment) The history is provided by the patient.   patient here complaining of pain to his right hip and pelvis after a fall. Tripped over a lawnmower. Also notes abrasions to his right elbow and wrist. Has a laceration to his left lower extremity with controlled bleeding. Unable to bear weight at this time. Patient denies any prodromal symptoms of syncope recently prior to that. Denies any head injury or neck pain with this current all. Denies any back pain. Denies any numbness or tingling to his legs or feet. No perineal numbness. Called EMS and was transported here Past Medical History  Diagnosis Date  . ASCVD (arteriosclerotic cardiovascular disease)   . Anemia     chronic mild anemia  . Gout   . Hypertension   . Lumbar disc disease     post laminectomy  . Chronic back pain   . History of echocardiogram 11/02/2010    EF range of 30 to 35% / There is hypokinsesis of the basal-mild inferolateral myocardium  . CHF (congestive heart failure)   . Heart murmur   . Ischemic cardiomyopathy   . Renal insufficiency   . Peripheral neuropathy   . Inferior myocardial infarction 1986  . Hyperlipidemia   . SOB (shortness of breath)   . Fatigue   . Peripheral neuropathy   . Coronary artery disease     CABG 2002 by [name] with LIMA to LAD, SVG to intermediate, SVG to LCX, & SVG to PL. Last nuclear in 2012 showing large inferior scar with EF of 39%.   . Blood transfusion     DEC 2012 - TWO UNITS  . 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  PT HAS BEEN OFF CHEMO SINCE HIS HOSPITALIZATION FEB 2013 FOR LEFT FOOT INFECTION  . Cancer   . History of shingles MARCH 2009    SHINGLE  LESIONS WERE AROUND RIGHT EYE--PT HAS RESIDUAL ITCHING AROUND THE EYE.  . Osteomyelitis     s/p toe amputation  . Chronic systolic heart failure     EF of 30% per echo 02/2012  . Protein malnutrition    Past Surgical History  Procedure Laterality Date  . Laminectomy    . Inguinal hernia repair    . Cardiac catheterization  06/20/2000    Severe coronary disease (totally occluded right artery, 90% left circumflex, 50-60% intermediate, and 90-95% ostial left anterior descending)   . Tonsillectomy    . Coronary artery bypass graft  2002    x4 / Left internal mammary artery to the LAD.  / Saphenous vein graft to the right posterolateral.  /  Saphenous vein graft to  the ramus intermedius. / Saphenous vein graft to the circumflex marginal     . Amputation  03/06/2012    Procedure: AMPUTATION RAY;  Surgeon: Harvie Junior, MD;  Location: [facility];  Service: Orthopedics;  Laterality: Left;  First Ray Amputation   No family history on file. History  Substance Use Topics  . Smoking status: Former Smoker -- 1.00 packs/day for 20 years    Types: Cigarettes    Quit date: 11/20/1984  . Smokeless tobacco: Never Used  . Alcohol Use: No    Review of  Systems  All other systems reviewed and are negative.    Allergies  Morphine and related  Home Medications   Current Outpatient Rx  Name  Route  Sig  Dispense  Refill  . allopurinol (ZYLOPRIM) 300 MG tablet   Oral   Take 0.5 tablets (150 mg total) by mouth daily.   90 tablet   0   . aspirin 81 MG tablet   Oral   Take 1 tablet (81 mg total) by mouth every other day.   30 tablet   0   . atorvastatin (LIPITOR) 10 MG tablet   Oral   Take 1 tablet (10 mg total) by mouth daily.   90 tablet   3   . B Complex-Biotin-FA (B COMPLETE PO)   Oral   Take 0.5 tablets by mouth 2 (two) times daily.          . carvedilol (COREG) 6.25 MG tablet      TAKE 1 TABLET TWICE A DAY   180 tablet   2   . Coenzyme Q10 (CO Q-10 PO)   Oral   Take 1  tablet by mouth daily.          . DULoxetine (CYMBALTA) 30 MG capsule   Oral   Take 2 capsules (60 mg total) by mouth daily.   180 capsule   1   . HYDROmorphone (DILAUDID) 4 MG tablet   Oral   Take 1 tablet (4 mg total) by mouth every 4 (four) hours as needed.   120 tablet   0     Needs local fill before mail order will be availab ...   . lisinopril (PRINIVIL,ZESTRIL) 20 MG tablet      TAKE 1 TABLET DAILY   90 tablet   2   . Multiple Vitamin (MULTIVITAMIN) capsule   Oral   Take 1 capsule by mouth daily.         Marland Kitchen EXPIRED: nitroGLYCERIN (NITROSTAT) 0.4 MG SL tablet   Sublingual   Place 1 tablet (0.4 mg total) under the tongue every 5 (five) minutes as needed for chest pain.   25 tablet   3   . Tamsulosin HCl (FLOMAX) 0.4 MG CAPS   Oral   Take 1 capsule (0.4 mg total) by mouth daily.   90 capsule   1    BP 180/74  Pulse 64  Temp(Src) 98.4 F (36.9 C) (Oral)  Resp 16  SpO2 96% Physical Exam  Nursing note and vitals reviewed. Constitutional: He is oriented to person, place, and time. He appears well-developed and well-nourished.  Non-toxic appearance. No distress.  HENT:  Head: Normocephalic and atraumatic.  Eyes: Conjunctivae, EOM and lids are normal. Pupils are equal, round, and reactive to light.  Neck: Normal range of motion. Neck supple. No spinous process tenderness and no muscular tenderness present. No tracheal deviation present. No mass present.  Cardiovascular: Normal rate, regular rhythm and normal heart sounds.  Exam reveals no gallop.   No murmur heard. Pulmonary/Chest: Effort normal and breath sounds normal. No stridor. No respiratory distress. He has no decreased breath sounds. He has no wheezes. He has no rhonchi. He has no rales.  Abdominal: Soft. Normal appearance and bowel sounds are normal. He exhibits no distension. There is no tenderness. There is no rebound and no CVA tenderness.  Musculoskeletal: Normal range of motion. He exhibits no  edema and no tenderness.       Arms:      Legs: No shortening or  rotation noted. Pain with range of motion to the leg. Neurovascular intact.  Neurological: He is alert and oriented to person, place, and time. He has normal strength. No cranial nerve deficit or sensory deficit. GCS eye subscore is 4. GCS verbal subscore is 5. GCS motor subscore is 6.  Skin: Skin is warm and dry. No abrasion and no rash noted.  Psychiatric: He has a normal mood and affect. His speech is normal and behavior is normal.    ED Course  LACERATION REPAIR Date/Time: 08/16/2013 8:21 PM Performed by: Toy Baker Authorized by: Lorre Nick T Consent: Verbal consent obtained. Risks and benefits: risks, benefits and alternatives were discussed Consent given by: patient Required items: required blood products, implants, devices, and special equipment available Body area: lower extremity Location details: left lower leg Laceration length: 4 cm Tendon involvement: none Nerve involvement: none Vascular damage: no Patient sedated: no Preparation: Patient was prepped and draped in the usual sterile fashion. Irrigation solution: saline Irrigation method: jet lavage Debridement: none Degree of undermining: none Skin closure: staples Number of sutures: 8 Approximation: close Approximation difficulty: simple Patient tolerance: Patient tolerated the procedure well with no immediate complications.   (including critical care time) Labs Review Labs Reviewed - No data to display Imaging Review No results found.  MDM  No diagnosis found. Pt given hydromorphone for pain x3. Spoke with Dr. Ophelia Charter and he will see the patient tomorrow. We'll limit the patient to the medicine service.        Toy Baker, MD 08/16/13 2022

## 2013-08-16 NOTE — ED Notes (Signed)
Pt oxygen dropped to 86%. Placed on 2l per [place].

## 2013-08-16 NOTE — H&P (Signed)
[facility] History and Physical  Timothy [name] ZOX:[id number] DOB: 1927/11/23 DOA: 08/16/2013  Referring physician: ED PCP: [name], MD   Chief Complaint: Fall, hip pain  HPI: Timothy [name] is a 79 y.o. male who suffered a fall today when getting off of his lawn mower.  Fall was mechanical in nature (shorts got caught up in the handle bar of his mower), and he landed on cement.  This resulted in R hip pain, worse with movement, better at rest.  Unable to bear weight.  EMS called and patient brought to ED.  In ED found to have R hip fracture, Dr. Ophelia Charter consulted and will see patient tomorrow, hospitalist asked to admit.  Review of Systems: 12 systems reviewed and otherwise negative.  Past Medical History  Diagnosis Date  . ASCVD (arteriosclerotic cardiovascular disease)   . Anemia     chronic mild anemia  . Gout   . Hypertension   . Lumbar disc disease     post laminectomy  . Chronic back pain   . History of echocardiogram 11/02/2010    EF range of 30 to 35% / There is hypokinsesis of the basal-mild inferolateral myocardium  . CHF (congestive heart failure)   . Heart murmur   . Ischemic cardiomyopathy   . Renal insufficiency   . Peripheral neuropathy   . Inferior myocardial infarction 1986  . Hyperlipidemia   . SOB (shortness of breath)   . Fatigue   . Peripheral neuropathy   . Coronary artery disease     CABG 2002 by [name] with LIMA to LAD, SVG to intermediate, SVG to LCX, & SVG to PL. Last nuclear in 2012 showing large inferior scar with EF of 39%.   . Blood transfusion     DEC 2012 - TWO UNITS  . 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  PT HAS BEEN OFF CHEMO SINCE HIS HOSPITALIZATION FEB 2013 FOR LEFT FOOT INFECTION  . Cancer   . History of shingles MARCH 2009    SHINGLE LESIONS WERE AROUND RIGHT EYE--PT HAS RESIDUAL ITCHING AROUND THE EYE.  . Osteomyelitis     s/p toe amputation  .  Chronic systolic heart failure     EF of 30% per echo 02/2012  . Protein malnutrition    Past Surgical History  Procedure Laterality Date  . Laminectomy    . Inguinal hernia repair    . Cardiac catheterization  06/20/2000    Severe coronary disease (totally occluded right artery, 90% left circumflex, 50-60% intermediate, and 90-95% ostial left anterior descending)   . Tonsillectomy    . Coronary artery bypass graft  2002    x4 / Left internal mammary artery to the LAD.  / Saphenous vein graft to the right posterolateral.  /  Saphenous vein graft to  the ramus intermedius. / Saphenous vein graft to the circumflex marginal     . Amputation  03/06/2012    Procedure: AMPUTATION RAY;  Surgeon: Harvie Junior, MD;  Location: [facility];  Service: Orthopedics;  Laterality: Left;  First Ray Amputation   Social History:  reports that he quit smoking about 28 years ago. His smoking use included Cigarettes. He has a 20 pack-year smoking history. He has never used smokeless tobacco. He reports that he does not drink alcohol or use illicit drugs.   Allergies  Allergen Reactions  . Morphine And Related Other (See Comments)    Agitated with  morphine drip    No family history on file.  Prior to Admission medications   Medication Sig Start Date End Date Taking? Authorizing Provider  allopurinol (ZYLOPRIM) 300 MG tablet Take 0.5 tablets (150 mg total) by mouth daily. 06/30/13  Yes Rosalio Macadamia, NP  aspirin EC 81 MG tablet Take 81 mg by mouth every Monday, Wednesday, and Friday.   Yes Historical Provider, MD  atorvastatin (LIPITOR) 10 MG tablet Take 1 tablet (10 mg total) by mouth daily. 07/24/13  Yes Peter M [place], MD  B Complex-Biotin-FA (B COMPLETE PO) Take 0.5 tablets by mouth 2 (two) times daily.    Yes Historical Provider, MD  carvedilol (COREG) 6.25 MG tablet Take 6.25 mg by mouth 2 (two) times daily with a meal.   Yes Historical Provider, MD  Coenzyme Q10 (CO Q-10 PO) Take 1 tablet by mouth every  morning.    Yes Historical Provider, MD  DULoxetine (CYMBALTA) 30 MG capsule Take 30 mg by mouth every evening.   Yes Historical Provider, MD  HYDROmorphone (DILAUDID) 4 MG tablet Take 4 mg by mouth every 4 (four) hours as needed for pain.   Yes Historical Provider, MD  lisinopril (PRINIVIL,ZESTRIL) 20 MG tablet Take 20 mg by mouth every evening.   Yes Historical Provider, MD  Multiple Vitamin (MULTIVITAMIN) capsule Take 1 capsule by mouth every morning.    Yes Historical Provider, MD  nitroGLYCERIN (NITROSTAT) 0.4 MG SL tablet Place 0.4 mg under the tongue every 5 (five) minutes as needed for chest pain.   Yes Historical Provider, MD  tamsulosin (FLOMAX) 0.4 MG CAPS capsule Take 0.4 mg by mouth at bedtime.   Yes Historical Provider, MD  vitamin C (ASCORBIC ACID) 500 MG tablet Take 500 mg by mouth every morning.   Yes Historical Provider, MD   Physical Exam: Filed Vitals:   08/16/13 1947  BP: 177/73  Pulse: 78  Temp: 98.6 F (37 C)  Resp: 16    General:  NAD, resting comfortably in bed Eyes: PEERLA EOMI ENT: mucous membranes moist Neck: supple w/o JVD Cardiovascular: RRR 3/6 SEM Respiratory: CTA B Abdomen: soft, nt, nd, bs+ Skin: no rash nor lesion Musculoskeletal: MAE, pain at right hip Psychiatric: normal tone and affect Neurologic: AAOx3, grossly non-focal  Labs on Admission:  Basic Metabolic Panel:  Recent Labs Lab 08/13/13 1132 08/16/13 1941  NA 131* 132*  K 4.7 4.2  CL  --  98  CO2 23 23  GLUCOSE 124 108*  BUN 20.8 27*  CREATININE 1.2 1.09  CALCIUM 9.3 9.1   Liver Function Tests:  Recent Labs Lab 08/13/13 1132 08/16/13 1941  AST 22 25  ALT 12 16  ALKPHOS 49 52  BILITOT 0.58 0.4  PROT 6.8 6.8  ALBUMIN 3.6 3.9   No results found for this basename: LIPASE, AMYLASE,  in the last 168 hours No results found for this basename: AMMONIA,  in the last 168 hours CBC:  Recent Labs Lab 08/13/13 1132 08/16/13 1941  WBC 5.8 15.0*  NEUTROABS 3.4 11.6*  HGB  10.0* 9.3*  HCT 29.9* 27.9*  MCV 90.4 89.4  PLT 167 177   Cardiac Enzymes: No results found for this basename: CKTOTAL, CKMB, CKMBINDEX, TROPONINI,  in the last 168 hours  BNP (last 3 results)  Recent Labs  12/13/12 0831  PROBNP 84.0   CBG: No results found for this basename: GLUCAP,  in the last 168 hours  Radiological Exams on Admission: Dg Chest 1 View  08/16/2013   *  RADIOLOGY REPORT*  Clinical Data: Preoperative respiratory examination for hip arthroplasty.  CHEST - 1 VIEW  Comparison: 03/10/2012 and prior chest radiographs  Findings: Cardiomegaly, CABG changes and mild pulmonary vascular congestion noted. This is a low-volume film with mild bibasilar atelectasis. There is no evidence of focal airspace disease, pulmonary edema, suspicious pulmonary nodule/mass, pleural effusion, or pneumothorax. No acute bony abnormalities are identified.  IMPRESSION: Cardiomegaly with mild pulmonary vascular congestion and mild basilar atelectasis.   Original Report Authenticated By: Harmon Pier, M.D.   Dg Hip Complete Right  08/16/2013   CLINICAL DATA:  Right hip pain post fall  EXAM: RIGHT HIP - COMPLETE 2+ VIEW  COMPARISON:  None  FINDINGS: Diffuse osseous demineralization.  Hip and SI joint spaces preserved.  Degenerative disc and facet disease changes lower lumbar spine.  Displaced intertrochanteric fracture right femur with mild varus angulation.  No pelvic fracture dislocation identified.  IMPRESSION: Displaced intertrochanteric fracture right femur with varus angulation.   Electronically Signed   By: Ulyses Southward M.D.   On: 08/16/2013 19:10    EKG: Independently reviewed.  Assessment/Plan Principal Problem:   Intertrochanteric fracture of right hip Active Problems:   CAD (coronary artery disease)   1. R hip fracture - ortho to see patient tomorrow for operative repair, on hip fracture pathway.  Patient is very active at baseline no CP nor SOB recently, activity includes mowing lawn  earlier today even, just had 2d echo done last month which actually showed improvement in EF to 41%, fixed defect has been present for many years (likely since MI back in 1986).  Monitor O2 sats closely while on narcotics as he seems to have started to desat somewhat in the ED and required O2.  If cardiac risk is still a concern, day team may wish to discuss with patients cardiologist, but no reason to believe that patient would require further cardiac evaluation at this time pre-op. 2. CAD - chronic and stable.    Code Status: Full Code (must indicate code status--if unknown or must be presumed, indicate so) Family Communication: Wife at bedside (indicate person spoken with, if applicable, with phone number if by telephone) Disposition Plan: Admit to inpatient (indicate anticipated LOS)  Time spent: 70 min  Laquetta Racey M. [facility] Pager [phone]  If 7PM-7AM, please contact night-coverage [email or web address] Password [facility] 08/16/2013, 9:35 PM

## 2013-08-16 NOTE — ED Notes (Signed)
Bed: WA17 Expected date:  Expected time:  Means of arrival:  Comments: Hip pain 

## 2013-08-17 ENCOUNTER — Encounter (HOSPITAL_COMMUNITY)
Admission: EM | Disposition: A | Discharge status: Rehabilitation Facility | Payer: Self-pay | Source: Home / Self Care | Attending: Internal Medicine

## 2013-08-17 ENCOUNTER — Inpatient Hospital Stay (HOSPITAL_COMMUNITY): Payer: Medicare Other | Admitting: Anesthesiology

## 2013-08-17 ENCOUNTER — Inpatient Hospital Stay (HOSPITAL_COMMUNITY): Payer: Medicare Other

## 2013-08-17 ENCOUNTER — Encounter (HOSPITAL_COMMUNITY): Payer: Self-pay | Admitting: Anesthesiology

## 2013-08-17 DIAGNOSIS — I509 Heart failure, unspecified: Secondary | ICD-10-CM

## 2013-08-17 DIAGNOSIS — E876 Hypokalemia: Secondary | ICD-10-CM

## 2013-08-17 DIAGNOSIS — C9000 Multiple myeloma not having achieved remission: Secondary | ICD-10-CM

## 2013-08-17 DIAGNOSIS — I5023 Acute on chronic systolic (congestive) heart failure: Secondary | ICD-10-CM

## 2013-08-17 DIAGNOSIS — I10 Essential (primary) hypertension: Secondary | ICD-10-CM

## 2013-08-17 HISTORY — PX: INTRAMEDULLARY (IM) NAIL INTERTROCHANTERIC: SHX5875

## 2013-08-17 SURGERY — FIXATION, FRACTURE, INTERTROCHANTERIC, WITH INTRAMEDULLARY ROD
Anesthesia: General | Laterality: Right | Wound class: Clean

## 2013-08-17 MED ORDER — ALUM & MAG HYDROXIDE-SIMETH 200-200-20 MG/5ML PO SUSP
15.0000 mL | Freq: Four times a day (QID) | ORAL | Status: DC | PRN
  Administered 2013-08-17 – 2013-08-18 (×2): 15 mL via ORAL
  Filled 2013-08-17 (×3): qty 30

## 2013-08-17 MED ORDER — HYDROCODONE-ACETAMINOPHEN 5-325 MG PO TABS
1.0000 | ORAL_TABLET | Freq: Four times a day (QID) | ORAL | Status: DC | PRN
  Administered 2013-08-18 – 2013-08-21 (×6): 2 via ORAL
  Filled 2013-08-17 (×2): qty 2
  Filled 2013-08-17: qty 1
  Filled 2013-08-17 (×5): qty 2

## 2013-08-17 MED ORDER — ACETAMINOPHEN 325 MG PO TABS: 650.0000 mg | ORAL_TABLET | Freq: Four times a day (QID) | ORAL | Status: DC | PRN

## 2013-08-17 MED ORDER — ASPIRIN 325 MG PO TABS: 325.0000 mg | ORAL_TABLET | Freq: Every day | ORAL | Status: DC

## 2013-08-17 MED ORDER — ACETAMINOPHEN 650 MG RE SUPP: 650.0000 mg | Freq: Four times a day (QID) | RECTAL | Status: DC | PRN

## 2013-08-17 MED ORDER — CEFAZOLIN SODIUM-DEXTROSE 2-3 GM-% IV SOLR
INTRAVENOUS | Status: AC
  Filled 2013-08-17: qty 50

## 2013-08-17 MED ORDER — ASPIRIN EC 325 MG PO TBEC
325.0000 mg | DELAYED_RELEASE_TABLET | Freq: Two times a day (BID) | ORAL | Status: DC
  Administered 2013-08-18 – 2013-08-21 (×8): 325 mg via ORAL
  Filled 2013-08-17 (×11): qty 1

## 2013-08-17 MED ORDER — HYDROMORPHONE HCL PF 1 MG/ML IJ SOLN
1.0000 mg | INTRAMUSCULAR | Status: DC | PRN
  Filled 2013-08-17: qty 1

## 2013-08-17 MED ORDER — BUPIVACAINE-EPINEPHRINE (PF) 0.5% -1:200000 IJ SOLN
INTRAMUSCULAR | Status: AC
  Filled 2013-08-17: qty 30

## 2013-08-17 MED ORDER — ONDANSETRON HCL 4 MG/2ML IJ SOLN
4.0000 mg | Freq: Four times a day (QID) | INTRAMUSCULAR | Status: DC | PRN
  Administered 2013-08-18: 11:00:00 4 mg via INTRAVENOUS
  Filled 2013-08-17: qty 2

## 2013-08-17 MED ORDER — GLYCOPYRROLATE 0.2 MG/ML IJ SOLN
INTRAMUSCULAR | Status: DC | PRN
  Administered 2013-08-17: 0.4 mg via INTRAVENOUS

## 2013-08-17 MED ORDER — FENTANYL CITRATE 0.05 MG/ML IJ SOLN
INTRAMUSCULAR | Status: DC | PRN
  Administered 2013-08-17: 50 ug via INTRAVENOUS
  Administered 2013-08-17: 100 ug via INTRAVENOUS

## 2013-08-17 MED ORDER — HYDROMORPHONE HCL PF 1 MG/ML IJ SOLN: 0.2500 mg | INTRAMUSCULAR | Status: DC | PRN

## 2013-08-17 MED ORDER — PHENOL 1.4 % MT LIQD
1.0000 | OROMUCOSAL | Status: DC | PRN
  Filled 2013-08-17: qty 177

## 2013-08-17 MED ORDER — ASPIRIN EC 325 MG PO TBEC: 325.0000 mg | DELAYED_RELEASE_TABLET | Freq: Two times a day (BID) | ORAL | Status: DC

## 2013-08-17 MED ORDER — HYDROMORPHONE HCL PF 2 MG/ML IJ SOLN
2.0000 mg | INTRAMUSCULAR | Status: DC | PRN
  Administered 2013-08-17 – 2013-08-21 (×16): 2 mg via INTRAVENOUS
  Filled 2013-08-17 (×16): qty 1

## 2013-08-17 MED ORDER — HYDROMORPHONE HCL PF 1 MG/ML IJ SOLN
0.1000 mg | INTRAMUSCULAR | Status: DC | PRN
  Filled 2013-08-17: qty 1

## 2013-08-17 MED ORDER — METOCLOPRAMIDE HCL 10 MG PO TABS: 5.0000 mg | ORAL_TABLET | Freq: Three times a day (TID) | ORAL | Status: DC | PRN

## 2013-08-17 MED ORDER — ACETAMINOPHEN 10 MG/ML IV SOLN
INTRAVENOUS | Status: DC | PRN
  Administered 2013-08-17: 1000 mg via INTRAVENOUS

## 2013-08-17 MED ORDER — SODIUM CHLORIDE 0.9 % IV SOLN
INTRAVENOUS | Status: DC
  Administered 2013-08-17: 22:00:00 via INTRAVENOUS
  Administered 2013-08-18: 10 mL/h via INTRAVENOUS
  Administered 2013-08-18: 17:00:00 via INTRAVENOUS

## 2013-08-17 MED ORDER — ONDANSETRON HCL 4 MG PO TABS: 4.0000 mg | ORAL_TABLET | Freq: Four times a day (QID) | ORAL | Status: DC | PRN

## 2013-08-17 MED ORDER — BUPIVACAINE-EPINEPHRINE 0.5% -1:200000 IJ SOLN
INTRAMUSCULAR | Status: DC | PRN
  Administered 2013-08-17: 30 mL

## 2013-08-17 MED ORDER — PROPOFOL 10 MG/ML IV BOLUS
INTRAVENOUS | Status: DC | PRN
  Administered 2013-08-17: 120 mg via INTRAVENOUS

## 2013-08-17 MED ORDER — CEFAZOLIN SODIUM-DEXTROSE 2-3 GM-% IV SOLR
2.0000 g | Freq: Once | INTRAVENOUS | Status: AC
  Administered 2013-08-17: 2 g via INTRAVENOUS

## 2013-08-17 MED ORDER — MENTHOL 3 MG MT LOZG
1.0000 | LOZENGE | OROMUCOSAL | Status: DC | PRN
  Filled 2013-08-17: qty 9

## 2013-08-17 MED ORDER — LACTATED RINGERS IV SOLN: INTRAVENOUS | Status: DC

## 2013-08-17 MED ORDER — METOCLOPRAMIDE HCL 5 MG/ML IJ SOLN
5.0000 mg | Freq: Three times a day (TID) | INTRAMUSCULAR | Status: DC | PRN
  Administered 2013-08-18: 17:00:00 10 mg via INTRAVENOUS
  Filled 2013-08-17: qty 2

## 2013-08-17 MED ORDER — LIDOCAINE HCL (PF) 2 % IJ SOLN
INTRAMUSCULAR | Status: DC | PRN
  Administered 2013-08-17: 75 mg

## 2013-08-17 MED ORDER — ONDANSETRON HCL 4 MG/2ML IJ SOLN
INTRAMUSCULAR | Status: DC | PRN
  Administered 2013-08-17: 4 mg via INTRAVENOUS

## 2013-08-17 MED ORDER — OXYCODONE-ACETAMINOPHEN 5-325 MG PO TABS: 2.0000 | ORAL_TABLET | ORAL | Status: DC | PRN

## 2013-08-17 MED ORDER — PHENYLEPHRINE HCL 10 MG/ML IJ SOLN
INTRAMUSCULAR | Status: DC | PRN
  Administered 2013-08-17 (×2): 80 ug via INTRAVENOUS
  Administered 2013-08-17 (×2): 120 ug via INTRAVENOUS

## 2013-08-17 MED ORDER — SODIUM CHLORIDE 0.45 % IV BOLUS: 500.0000 mL | Freq: Once | INTRAVENOUS | Status: DC

## 2013-08-17 MED ORDER — SODIUM CHLORIDE 0.9 % IV BOLUS (SEPSIS)
500.0000 mL | Freq: Once | INTRAVENOUS | Status: AC
  Administered 2013-08-17: 500 mL via INTRAVENOUS

## 2013-08-17 MED ORDER — NEOSTIGMINE METHYLSULFATE 1 MG/ML IJ SOLN
INTRAMUSCULAR | Status: DC | PRN
  Administered 2013-08-17: 3 mg via INTRAVENOUS

## 2013-08-17 MED ORDER — ROCURONIUM BROMIDE 100 MG/10ML IV SOLN
INTRAVENOUS | Status: DC | PRN
  Administered 2013-08-17: 20 mg via INTRAVENOUS

## 2013-08-17 MED ORDER — SUCCINYLCHOLINE CHLORIDE 20 MG/ML IJ SOLN
INTRAMUSCULAR | Status: DC | PRN
  Administered 2013-08-17: 100 mg via INTRAVENOUS

## 2013-08-17 MED ORDER — ACETAMINOPHEN 10 MG/ML IV SOLN
1000.0000 mg | Freq: Once | INTRAVENOUS | Status: DC
  Filled 2013-08-17 (×2): qty 100

## 2013-08-17 MED ORDER — LACTATED RINGERS IV SOLN
INTRAVENOUS | Status: DC | PRN
  Administered 2013-08-17: 20:00:00 via INTRAVENOUS

## 2013-08-17 MED ORDER — ONDANSETRON HCL 4 MG/2ML IJ SOLN: 4.0000 mg | Freq: Once | INTRAMUSCULAR | Status: DC | PRN

## 2013-08-17 MED ORDER — 0.9 % SODIUM CHLORIDE (POUR BTL) OPTIME
TOPICAL | Status: DC | PRN
  Administered 2013-08-17: 1000 mL

## 2013-08-17 SURGICAL SUPPLY — 43 items
BAG ZIPLOCK 12X15 (MISCELLANEOUS) ×2 IMPLANT
BIT DRILL 4.3MMS DISTAL GRDTED (BIT) ×1 IMPLANT
BNDG COHESIVE 6X5 TAN STRL LF (GAUZE/BANDAGES/DRESSINGS) ×2 IMPLANT
CLOTH BEACON ORANGE TIMEOUT ST (SAFETY) ×2 IMPLANT
DRAPE C-ARM 42X120 X-RAY (DRAPES) ×2 IMPLANT
DRAPE INCISE IOBAN 66X45 STRL (DRAPES) ×2 IMPLANT
DRAPE LG THREE QUARTER DISP (DRAPES) ×2 IMPLANT
DRAPE ORTHO SPLIT 77X108 STRL (DRAPES)
DRAPE STERI IOBAN 125X83 (DRAPES) ×2 IMPLANT
DRAPE SURG ORHT 6 SPLT 77X108 (DRAPES) IMPLANT
DRAPE U-SHAPE 47X51 STRL (DRAPES) ×2 IMPLANT
DRILL 4.3MMS DISTAL GRADUATED (BIT) ×2
DRSG PAD ABDOMINAL 8X10 ST (GAUZE/BANDAGES/DRESSINGS) IMPLANT
DURAPREP 26ML APPLICATOR (WOUND CARE) ×2 IMPLANT
ELECT REM PT RETURN 9FT ADLT (ELECTROSURGICAL) ×2
ELECTRODE REM PT RTRN 9FT ADLT (ELECTROSURGICAL) ×1 IMPLANT
FACESHIELD LNG OPTICON STERILE (SAFETY) ×4 IMPLANT
GLOVE ORTHO TXT STRL SZ7.5 (GLOVE) ×2 IMPLANT
GLOVE SURG ORTHO 8.5 STRL (GLOVE) ×2 IMPLANT
GOWN PREVENTION PLUS LG XLONG (DISPOSABLE) ×4 IMPLANT
GUIDEPIN 3.2X17.5 THRD DISP (PIN) ×2 IMPLANT
GUIDEWIRE BALL NOSE 80CM (WIRE) ×2 IMPLANT
HIP FR NAIL LAG SCREW 10.5X110 (Orthopedic Implant) ×2 IMPLANT
KIT BASIN OR (CUSTOM PROCEDURE TRAY) ×2 IMPLANT
MANIFOLD NEPTUNE II (INSTRUMENTS) ×2 IMPLANT
NAIL HIP FRAC RT 130 11MX400M (Nail) ×2 IMPLANT
PACK GENERAL/GYN (CUSTOM PROCEDURE TRAY) ×2 IMPLANT
PACK LOWER EXTREMITY WL (CUSTOM PROCEDURE TRAY) ×2 IMPLANT
PAD CAST 4YDX4 CTTN HI CHSV (CAST SUPPLIES) ×1 IMPLANT
PADDING CAST COTTON 4X4 STRL (CAST SUPPLIES) ×2
POSITIONER SURGICAL ARM (MISCELLANEOUS) ×4 IMPLANT
SCREW BONE CORTICAL 5.0X44 (Screw) ×2 IMPLANT
SCREW LAG HIP FR NAIL 10.5X110 (Orthopedic Implant) ×1 IMPLANT
SCREWDRIVER HEX TIP 3.5MM (MISCELLANEOUS) ×2 IMPLANT
SPONGE GAUZE 4X4 12PLY (GAUZE/BANDAGES/DRESSINGS) ×2 IMPLANT
STAPLER VISISTAT (STAPLE) ×2 IMPLANT
STOCKINETTE 8 INCH (MISCELLANEOUS) ×2 IMPLANT
SUT VIC AB 0 CT1 36 (SUTURE) ×4 IMPLANT
SUT VIC AB 2-0 CT1 27 (SUTURE) ×2
SUT VIC AB 2-0 CT1 TAPERPNT 27 (SUTURE) ×1 IMPLANT
TAPE CLOTH SURG 4X10 WHT LF (GAUZE/BANDAGES/DRESSINGS) ×2 IMPLANT
TOWEL OR 17X26 10 PK STRL BLUE (TOWEL DISPOSABLE) ×4 IMPLANT
TRAY FOLEY CATH 14FRSI W/METER (CATHETERS) ×2 IMPLANT

## 2013-08-17 NOTE — Consult Note (Signed)
Reason for Consult: right IT hip fracture  Referring Physician: Dr.   Roslyn Smiling is an 79 y.o. male.  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   Past Medical History  Diagnosis Date  . ASCVD (arteriosclerotic cardiovascular disease)   . Anemia     chronic mild anemia  . Gout   . Hypertension   . Lumbar disc disease     post laminectomy  . Chronic back pain   . History of echocardiogram 11/02/2010    EF range of 30 to 35% / There is hypokinsesis of the basal-mild inferolateral myocardium  . CHF (congestive heart failure)   . Heart murmur   . Ischemic cardiomyopathy   . Renal insufficiency   . Peripheral neuropathy   . Inferior myocardial infarction 1986  . Hyperlipidemia   . SOB (shortness of breath)   . Fatigue   . Peripheral neuropathy   . Coronary artery disease     CABG 2002 by [name] with LIMA to LAD, SVG to intermediate, SVG to LCX, & SVG to PL. Last nuclear in 2012 showing large inferior scar with EF of 39%.   . Blood transfusion     DEC 2012 - TWO UNITS  . 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  PT HAS BEEN OFF CHEMO SINCE HIS HOSPITALIZATION FEB 2013 FOR LEFT FOOT INFECTION  . Cancer   . History of shingles MARCH 2009    SHINGLE LESIONS WERE AROUND RIGHT EYE--PT HAS RESIDUAL ITCHING AROUND THE EYE.  . Osteomyelitis     s/p toe amputation  . Chronic systolic heart failure     EF of 30% per echo 02/2012  . Protein malnutrition     Past Surgical History  Procedure Laterality Date  . Laminectomy    . Inguinal hernia repair    . Cardiac catheterization  06/20/2000    Severe coronary disease (totally occluded right artery, 90% left circumflex, 50-60% intermediate, and 90-95% ostial left anterior descending)   . Tonsillectomy    . Coronary artery bypass  graft  2002    x4 / Left internal mammary artery to the LAD.  / Saphenous vein graft to the right posterolateral.  /  Saphenous vein graft to  the ramus intermedius. / Saphenous vein graft to the circumflex marginal     . Amputation  03/06/2012    Procedure: AMPUTATION RAY;  Surgeon: Harvie Junior, MD;  Location: [facility];  Service: Orthopedics;  Laterality: Left;  First Ray Amputation    No family history on file.  Social History:  reports that he quit smoking about 28 years ago. His smoking use included Cigarettes. He has a 20 pack-year smoking history. He has never used smokeless tobacco. He reports that he does not drink alcohol or use illicit drugs.  Allergies:  Allergies  Allergen Reactions  . Morphine And Related Other (See Comments)    Agitated with morphine drip    Medications: I have reviewed the patient's current medications.  Results for orders placed during the hospital encounter of 08/16/13 (from the past 48 hour(s))  CBC WITH DIFFERENTIAL     Status: Abnormal   Collection Time    08/16/13  7:41 PM      Result Value Range   WBC 15.0 (*) 4.0 -  10.5 K/uL   RBC 3.12 (*) 4.22 - 5.81 MIL/uL   Hemoglobin 9.3 (*) 13.0 - 17.0 g/dL   HCT 16.1 (*) 09.6 - 04.5 %   MCV 89.4  78.0 - 100.0 fL   MCH 29.8  26.0 - 34.0 pg   MCHC 33.3  30.0 - 36.0 g/dL   RDW 40.9 (*) 81.1 - 91.4 %   Platelets 177  150 - 400 K/uL   Neutrophils Relative % 77  43 - 77 %   Neutro Abs 11.6 (*) 1.7 - 7.7 K/uL   Lymphocytes Relative 14  12 - 46 %   Lymphs Abs 2.0  0.7 - 4.0 K/uL   Monocytes Relative 8  3 - 12 %   Monocytes Absolute 1.2 (*) 0.1 - 1.0 K/uL   Eosinophils Relative 1  0 - 5 %   Eosinophils Absolute 0.1  0.0 - 0.7 K/uL   Basophils Relative 0  0 - 1 %   Basophils Absolute 0.0  0.0 - 0.1 K/uL  COMPREHENSIVE METABOLIC PANEL     Status: Abnormal   Collection Time    08/16/13  7:41 PM      Result Value Range   Sodium 132 (*) 135 - 145 mEq/L   Potassium 4.2  3.5 - 5.1 mEq/L   Chloride 98  96 -  112 mEq/L   CO2 23  19 - 32 mEq/L   Glucose, Bld 108 (*) 70 - 99 mg/dL   BUN 27 (*) 6 - 23 mg/dL   Creatinine, Ser 7.82  0.50 - 1.35 mg/dL   Calcium 9.1  8.4 - 95.6 mg/dL   Total Protein 6.8  6.0 - 8.3 g/dL   Albumin 3.9  3.5 - 5.2 g/dL   AST 25  0 - 37 U/L   ALT 16  0 - 53 U/L   Alkaline Phosphatase 52  39 - 117 U/L   Total Bilirubin 0.4  0.3 - 1.2 mg/dL   GFR calc non Af Amer 61 (*) >90 mL/min   GFR calc Af Amer 71 (*) >90 mL/min   Comment: (NOTE)     The eGFR has been calculated using the CKD EPI equation.     This calculation has not been validated in all clinical situations.     eGFR's persistently <90 mL/min signify possible Chronic Kidney     Disease.    Dg Chest 1 View  08/16/2013   *RADIOLOGY REPORT*  Clinical Data: Preoperative respiratory examination for hip arthroplasty.  CHEST - 1 VIEW  Comparison: 03/10/2012 and prior chest radiographs  Findings: Cardiomegaly, CABG changes and mild pulmonary vascular congestion noted. This is a low-volume film with mild bibasilar atelectasis. There is no evidence of focal airspace disease, pulmonary edema, suspicious pulmonary nodule/mass, pleural effusion, or pneumothorax. No acute bony abnormalities are identified.  IMPRESSION: Cardiomegaly with mild pulmonary vascular congestion and mild basilar atelectasis.   Original Report Authenticated By: Harmon Pier, M.D.   Dg Hip Complete Right  08/16/2013   CLINICAL DATA:  Right hip pain post fall  EXAM: RIGHT HIP - COMPLETE 2+ VIEW  COMPARISON:  None  FINDINGS: Diffuse osseous demineralization.  Hip and SI joint spaces preserved.  Degenerative disc and facet disease changes lower lumbar spine.  Displaced intertrochanteric fracture right femur with mild varus angulation.  No pelvic fracture dislocation identified.  IMPRESSION: Displaced intertrochanteric fracture right femur with varus angulation.   Electronically Signed   By: Ulyses Southward M.D.   On: 08/16/2013 19:10  Review of Systems   Constitutional: Negative for fever, chills and weight loss.  Eyes:       Right scleral hemorrhage. Vision good  Cardiovascular:       CABG times 4 2002  Musculoskeletal:       Left foot infection. With osteomyelitis  Skin: Negative.   Endo/Heme/Allergies:       Myeloma, multiple.      Blood pressure 150/63, pulse 73, temperature 98.1 F (36.7 C), temperature source Oral, resp. rate 18, height 5\' 10"  (1.778 m), weight 78.926 kg (174 lb), SpO2 94.00%. Physical Exam  Constitutional: He is oriented to person, place, and time. He appears well-developed.  HENT:  Head: Normocephalic.  Eyes:  Right conjuctival/scleral hemorrhage right lateral eye  Cardiovascular: Normal rate.   Murmur heard. Respiratory: Effort normal.  GI: Soft.  Musculoskeletal:  Right LE short and ER  Neurological: He is alert and oriented to person, place, and time.  Skin: Skin is warm.  Psychiatric: He has a normal mood and affect. His behavior is normal.    Assessment/Plan: Right IT hip fx with lesser troch off as separate piece.  Plan troch nail with prox and distal interlock.  Risks discussed, he understands and agrees to proceed. All ?'s answered.  Laporcha Marchesi C 08/17/2013, 2:22 PM

## 2013-08-17 NOTE — Transfer of Care (Signed)
Immediate Anesthesia Transfer of Care Note  Patient: Timothy [name]  Procedure(s) Performed: Procedure(s): INTRAMEDULLARY (IM) NAIL INTERTROCHANTRIC (Right)  Patient Location: PACU  Anesthesia Type:General  Level of Consciousness: awake, sedated and responds to stimulation  Airway & Oxygen Therapy: Patient Spontanous Breathing and Patient connected to face mask oxygen  Post-op Assessment: Report given to PACU RN and Post -op Vital signs reviewed and stable  Post vital signs: Reviewed and stable  Complications: No apparent anesthesia complications

## 2013-08-17 NOTE — Anesthesia Postprocedure Evaluation (Signed)
  Anesthesia Post-op Note  Anesthesia Post Note  Patient: Timothy [name]  Procedure(s) Performed: Procedure(s) (LRB): INTRAMEDULLARY (IM) NAIL INTERTROCHANTRIC (Right)  Anesthesia type: General  Patient location: PACU  Post pain: Pain level controlled  Post assessment: Post-op Vital signs reviewed  Last Vitals:  Filed Vitals:   08/17/13 2200  BP: 132/61  Pulse: 83  Temp: 36.9 C  Resp: 10    Post vital signs: Reviewed  Level of consciousness: sedated  Complications: No apparent anesthesia complications

## 2013-08-17 NOTE — Preoperative (Signed)
Beta Blockers   Reason not to administer Beta Blockers:Took Coreg this afternoon.

## 2013-08-17 NOTE — Brief Op Note (Signed)
08/16/2013 - 08/17/2013  9:22 PM  PATIENT:  Timothy [name]  79 y.o. male  PRE-OPERATIVE DIAGNOSIS:  right intertroch fracture  POST-OPERATIVE DIAGNOSIS:  Right intertroch fracture  PROCEDURE:  Procedure(s): INTRAMEDULLARY (IM) NAIL INTERTROCHANTRIC (Right)  SURGEON:  Surgeon(s) and Role:    * Eldred Manges, MD - Primary  PHYSICIAN ASSISTANT:   ASSISTANTS: none   ANESTHESIA:   local and general  EBL:  Total I/O In: -  Out: 540 [Urine:340; Blood:200]  BLOOD ADMINISTERED:none  DRAINS: none   LOCAL MEDICATIONS USED:  MARCAINE     SPECIMEN:  No Specimen  DISPOSITION OF SPECIMEN:  N/A  COUNTS:  YES  TOURNIQUET:  * No tourniquets in log *  DICTATION: .Other Dictation: Dictation Number [id number]  PLAN OF CARE: already inpatient orders.   PATIENT DISPOSITION:  PACU - hemodynamically stable.   Delay start of Pharmacological VTE agent (>24hrs) due to surgical blood loss or risk of bleeding:  SCD's and ASA

## 2013-08-17 NOTE — Anesthesia Preprocedure Evaluation (Addendum)
Anesthesia Evaluation  Patient identified by MRN, date of birth, ID band Patient awake    Reviewed: Allergy & Precautions, H&P , NPO status , Patient's Chart, lab work & pertinent test results, reviewed documented beta blocker date and time   History of Anesthesia Complications Negative for: history of anesthetic complications  Airway Mallampati: III TM Distance: >3 FB Neck ROM: full    Dental  (+) Teeth Intact   Pulmonary neg pulmonary ROS,  breath sounds clear to auscultation  Pulmonary exam normal       Cardiovascular Exercise Tolerance: Good hypertension, On Home Beta Blockers and On Medications + CAD, + CABG (2002) and +CHF (last echo (07/18/13) EF was 35-40%) Rhythm:regular Rate:Normal  dyslipidemia   Neuro/Psych Chronic back pain on dilaudid  Neuromuscular disease (peripheral neuropathy (socks and gloves distribution from multiple myeloma)) negative psych ROS   GI/Hepatic negative GI ROS, Neg liver ROS,   Endo/Other  negative endocrine ROS  Renal/GU Renal disease (chronic renal insufficiency - Cr 1.09 today)     Musculoskeletal  (+) Arthritis - (gout),   Abdominal   Peds  Hematology  (+) Blood dyscrasia (h/o multiple myeloma s/p chemo), anemia , H/o blood transfusion   Anesthesia Other Findings Last ate at 1930 last night  Reproductive/Obstetrics negative OB ROS                          Anesthesia Physical Anesthesia Plan  ASA: III  Anesthesia Plan: General ETT   Post-op Pain Management:    Induction:   Airway Management Planned:   Additional Equipment:   Intra-op Plan:   Post-operative Plan:   Informed Consent: I have reviewed the patients History and Physical, chart, labs and discussed the procedure including the risks, benefits and alternatives for the proposed anesthesia with the patient or authorized representative who has indicated his/her understanding and acceptance.    Dental Advisory Given  Plan Discussed with: CRNA and Surgeon  Anesthesia Plan Comments:         Anesthesia Quick Evaluation

## 2013-08-17 NOTE — Progress Notes (Addendum)
[facility] PROGRESS NOTE  Timothy [name] ZOX:[id number] DOB: 1928/03/28 DOA: 08/16/2013 PCP: [name], MD  HPI:  Timothy [name] is a 79 y.o. male who suffered a fall today when getting off of his lawn mower. In ED found to have R hip fracture, Dr. Ophelia Charter consulted.    Assessment/Plan:  Right hip fracture - per ortho Chronic systolic heart failure - most recent EF ~40%. Saw his cardiologist in office at the beginning of September and he was showing signs of fluid overload with edema and weight was 184 lbs. Better after Lasix as outpatient, weight here 174. He looks compensated now without edema or crackles on exam. Continuing his home medications.  - moderate risk based on RCSRI Leukocytosis - likely reactive.  HLD - continue statin Multiple myeloma - followed as an outpatient by Dr. Darnelle Catalan HTN - continue home meds DVT Prophylaxis - per ortho  Code Status: Full Family Communication: wife bedside  Disposition Plan: inpatient  Consultants:  Orthopedic surgery  Procedures:  none  HPI/Subjective: - right hip pain  Objective: Filed Vitals:   08/17/13 0800 08/17/13 0810 08/17/13 1002 08/17/13 1005  BP:  164/70 150/63   Pulse:  80 73   Temp:   98.1 F (36.7 C)   TempSrc:   Oral   Resp: 16  18   Height:      Weight:      SpO2: 98%  90% 94%    Intake/Output Summary (Last 24 hours) at 08/17/13 1119 Last data filed at 08/17/13 0700  Gross per 24 hour  Intake      0 ml  Output    750 ml  Net   -750 ml   Filed Weights   08/16/13 2200  Weight: 78.926 kg (174 lb)    Exam:   General:  NAD  Cardiovascular: regular rate and rhythm, without MRG  Respiratory: good air movement, clear to auscultation throughout, no wheezing, ronchi or rales  Abdomen: soft, not tender to palpation, positive bowel sounds  MSK: no peripheral edema  Neuro: non focal  Data Reviewed: Basic Metabolic Panel:  Recent Labs Lab 08/13/13 1132 08/16/13 1941  NA 131*  132*  K 4.7 4.2  CL  --  98  CO2 23 23  GLUCOSE 124 108*  BUN 20.8 27*  CREATININE 1.2 1.09  CALCIUM 9.3 9.1   Liver Function Tests:  Recent Labs Lab 08/13/13 1132 08/16/13 1941  AST 22 25  ALT 12 16  ALKPHOS 49 52  BILITOT 0.58 0.4  PROT 6.8 6.8  ALBUMIN 3.6 3.9   CBC:  Recent Labs Lab 08/13/13 1132 08/16/13 1941  WBC 5.8 15.0*  NEUTROABS 3.4 11.6*  HGB 10.0* 9.3*  HCT 29.9* 27.9*  MCV 90.4 89.4  PLT 167 177   BNP (last 3 results)  Recent Labs  12/13/12 0831  PROBNP 84.0   Studies: Dg Chest 1 View  08/16/2013   *RADIOLOGY REPORT*  Clinical Data: Preoperative respiratory examination for hip arthroplasty.  CHEST - 1 VIEW  Comparison: 03/10/2012 and prior chest radiographs  Findings: Cardiomegaly, CABG changes and mild pulmonary vascular congestion noted. This is a low-volume film with mild bibasilar atelectasis. There is no evidence of focal airspace disease, pulmonary edema, suspicious pulmonary nodule/mass, pleural effusion, or pneumothorax. No acute bony abnormalities are identified.  IMPRESSION: Cardiomegaly with mild pulmonary vascular congestion and mild basilar atelectasis.   Original Report Authenticated By: Harmon Pier, M.D.   Dg Hip Complete Right  08/16/2013   CLINICAL DATA:  Right hip pain post fall  EXAM: RIGHT HIP - COMPLETE 2+ VIEW  COMPARISON:  None  FINDINGS: Diffuse osseous demineralization.  Hip and SI joint spaces preserved.  Degenerative disc and facet disease changes lower lumbar spine.  Displaced intertrochanteric fracture right femur with mild varus angulation.  No pelvic fracture dislocation identified.  IMPRESSION: Displaced intertrochanteric fracture right femur with varus angulation.   Electronically Signed   By: Ulyses Southward M.D.   On: 08/16/2013 19:10    Scheduled Meds: . allopurinol  150 mg Oral Daily  . aspirin EC  325 mg Oral Daily  . atorvastatin  10 mg Oral Daily  . carvedilol  6.25 mg Oral BID WC  . DULoxetine  30 mg Oral QPM  .  lisinopril  20 mg Oral QPM  . tamsulosin  0.4 mg Oral QHS  . vitamin C  500 mg Oral q morning - 10a   Continuous Infusions: . sodium chloride 20 mL/hr at 08/16/13 1759    Principal Problem:   Intertrochanteric fracture of right hip Active Problems:   CAD (coronary artery disease)  Time spent: 35  Pamella Pert, MD [facility] Pager [phone]. If 7 PM - 7 AM, please contact night-coverage at [email or web address], password [facility] 08/17/2013, 11:19 AM  LOS: 1 day

## 2013-08-18 ENCOUNTER — Encounter (HOSPITAL_COMMUNITY): Payer: Self-pay | Admitting: Orthopaedic Surgery

## 2013-08-18 DIAGNOSIS — I5022 Chronic systolic (congestive) heart failure: Secondary | ICD-10-CM

## 2013-08-18 LAB — CBC
Platelets: 157 10*3/uL (ref 150–400)
RBC: 2.82 MIL/uL — ABNORMAL LOW (ref 4.22–5.81)
RDW: 16.2 % — ABNORMAL HIGH (ref 11.5–15.5)
WBC: 16 10*3/uL — ABNORMAL HIGH (ref 4.0–10.5)

## 2013-08-18 LAB — BASIC METABOLIC PANEL
CO2: 23 mEq/L (ref 19–32)
Calcium: 8.7 mg/dL (ref 8.4–10.5)
Chloride: 97 mEq/L (ref 96–112)
GFR calc Af Amer: 66 mL/min — ABNORMAL LOW (ref >90)
Potassium: 4.5 mEq/L (ref 3.5–5.1)
Sodium: 129 mEq/L — ABNORMAL LOW (ref 135–145)

## 2013-08-18 MED ORDER — POLYETHYLENE GLYCOL 3350 17 G PO PACK
17.0000 g | PACK | Freq: Every evening | ORAL | Status: DC | PRN
  Filled 2013-08-18: qty 1

## 2013-08-18 MED ORDER — POLYETHYLENE GLYCOL 3350 17 G PO PACK
17.0000 g | PACK | Freq: Every day | ORAL | Status: DC
  Administered 2013-08-18 – 2013-08-21 (×3): 17 g via ORAL
  Filled 2013-08-18 (×4): qty 1

## 2013-08-18 MED ORDER — CALCIUM CARBONATE ANTACID 500 MG PO CHEW
1.0000 | CHEWABLE_TABLET | Freq: Two times a day (BID) | ORAL | Status: DC
  Administered 2013-08-18 – 2013-08-21 (×6): 200 mg via ORAL
  Filled 2013-08-18 (×8): qty 1

## 2013-08-18 NOTE — Op Note (Signed)
NAMEJABEN, Timothy [name] NO.:  [id number]  MEDICAL RECORD NO.:  [id number]  LOCATION:  1405                         FACILITY:  [facility]  PHYSICIAN:  Mark C. Ophelia Charter, M.D.    DATE OF BIRTH:  Jun 23, 1928  DATE OF PROCEDURE:  08/17/2013 DATE OF DISCHARGE:                              OPERATIVE REPORT   PREOPERATIVE DIAGNOSIS:  Right comminuted intertrochanteric fracture.  POSTOPERATIVE DIAGNOSIS:  Right comminuted intertrochanteric fracture.  PROCEDURE:  Right trochanteric nail with proximal and distal interlock DePuy 11 mm one-pin lag screw, 44 mm distal interlock.  SURGEON:  Mark C. Ophelia Charter, M.D.  ANESTHESIA:  General plus Marcaine local.  ESTIMATED BLOOD LOSS:  200 mL.  DRAINS:  None.  PROCEDURE IN DETAIL:  After induction of general anesthesia, the patient on the fracture table, standard prepping and draping, well leg holder for the good leg, operative leg on the right was internally rotated. Traction applied using a Hana boot.  The right arm was draped across the chest with careful padding and positioning and foam pads and secured.  C- arm was brought in, checked, and reduction was adjusted to allow excellent position.  Prepping and draping was performed with preoperative Ancef.  Time-out procedure was completed.  The area was squared with towels, Betadine, Steri-Drape, and large shower curtain Betadine draped.  Incision was made starting at the trochanter extending proximally.  Medius fascia was split.  There was multiple small veins that were coagulated.  The trochanter was palpated.  The pin was started at the tip, checked under x-ray on AP, drilled in and reamers started, it slipped off the back, remaining small portion of the posterior aspect of the tightrope.  There was some muscle bleeding, which was coagulated with the cautery.  Pin was repositioned, re-checked under fluoroscopy, and then passed down the canal by hand pushing it watching the pin bent as  it went down the canal.  It was in good position on lateral over reaming and then placed in the beaded tip rod down the superior pole of patella.  Based on measurements, appropriate length rod was selected, 11 mm one 12.5 reamer easily passed, barely caught some cortex, and the rod was inserted impacted down into position, and using the side guide, incision was made laterally.  The subtroch region pin was drilled up center-center in the head, measured, reamed, screw inserted, locked down from the top.  Lateral guide was removed, and then the distal interlock 44 mm screw was placed with the free hand technique, confirmed going through the hole on lateral and catching the opposite cortex in AP.  Instrument count and needle count was correct. The standard closure of the fascia with #1 Vicryl, 2-0 Vicryl, subcutaneous tissue, skin staple, closure Marcaine infiltration at all 3 incisions and postop dressing.  The procedure was as posted.     Mark C. Ophelia Charter, M.D.     MCY/MEDQ  D:  08/17/2013  T:  08/18/2013  Job:  [id number]

## 2013-08-18 NOTE — Progress Notes (Addendum)
Subjective: 1 Day Post-Op Procedure(s) (LRB): INTRAMEDULLARY (IM) NAIL INTERTROCHANTRIC (Right) Patient reports pain as moderate.    Objective: Vital signs in last 24 hours: Temp:  [97.7 F (36.5 C)-98.8 F (37.1 C)] 97.7 F (36.5 C) (09/29 0655) Pulse Rate:  [73-106] 90 (09/29 0655) Resp:  [10-20] 20 (09/29 0655) BP: (94-176)/(56-76) 160/72 mmHg (09/29 0655) SpO2:  [90 %-98 %] 94 % (09/29 0655)  Intake/Output from previous day: 09/28 0701 - 09/29 0700 In: 2831.3 [P.O.:480; I.V.:1851.3; IV Piggyback:500] Out: 1360 [Urine:1160; Blood:200] Intake/Output this shift:     Recent Labs  08/16/13 1941 08/18/13 0435  HGB 9.3* 8.4*    Recent Labs  08/16/13 1941 08/18/13 0435  WBC 15.0* 16.0*  RBC 3.12* 2.82*  HCT 27.9* 25.7*  PLT 177 157    Recent Labs  08/16/13 1941 08/18/13 0435  NA 132* 129*  K 4.2 4.5  CL 98 97  CO2 23 23  BUN 27* 24*  CREATININE 1.09 1.15  GLUCOSE 108* 131*  CALCIUM 9.1 8.7   No results found for this basename: LABPT, INR,  in the last 72 hours  Neurologically intact  Assessment/Plan: 1 Day Post-Op Procedure(s) (LRB): INTRAMEDULLARY (IM) NAIL INTERTROCHANTRIC (Right) Up with therapy, dressing change  Timothy [name] C 08/18/2013, 8:10 AM   Dressing change today,  If Hgb drops further can transfuse one unit. Will check in AM, it may start to increase.  If symptomatic with therapy with dyspnea can also consider thransfusion one unit.  Will be WBAT ,   Rx for ASA 325 mg bid times 4 wks for DVT proph then he will go back to one a day for cardiac protection as he was before admission.

## 2013-08-18 NOTE — Care Management Note (Addendum)
    Page 1 of 1   08/21/2013     12:47:28 PM   CARE MANAGEMENT NOTE 08/21/2013  Patient:  Timothy [name], Timothy [name]   Account Number:  [id number]  Date Initiated:  08/18/2013  Documentation initiated by:  Lanier Clam  Subjective/Objective Assessment:   79 Y/O M ADMITTED W/R HIP FX.CHF.     Action/Plan:   FROM HOME W/SPOUSE.HAS PCP,PHARMACY.   Anticipated DC Date:  08/21/2013   Anticipated DC Plan:  IP REHAB FACILITY      DC Planning Services  CM consult      Choice offered to / List presented to:             Status of service:  Completed, signed off Medicare Important Message given?   (If response is "NO", the following Medicare IM given date fields will be blank) Date Medicare IM given:   Date Additional Medicare IM given:    Discharge Disposition:  IP REHAB FACILITY  Per UR Regulation:  Reviewed for med. necessity/level of care/duration of stay  If discussed at Long Length of Stay Meetings, dates discussed:    Comments:  08/21/13 [name] RN,BSN NCM [phone] FOR D/C TODAY TO CIR @ [facility] TO MANAGE D/C, & TRANSFER VIA [facility] ONCE HAVE A RECEIVING MD/RM/BED.  08/20/13 [name] RN,BSN NCM [phone] POD#3 IM NAIL.CIR-RECOMMENDATIONS-IP REHAB APPROPRIATE.PER [facility] IP REHAB COORDINATOR WILL ACCEPT PATIENT IN AM FOR INPT REHAB.[facility] WILL AWAIT RM,BED,RECEIVING MD TOMORROW FOR D/C VIA [facility] TOMORROW.  08/18/13 [name] RN,BSN NCM [phone] POD#1 IM NAIL R HIP.AWAIT PT/OT RECOMMENDATIONS.

## 2013-08-18 NOTE — Clinical Social Work Psychosocial (Signed)
     Clinical Social Work Department BRIEF PSYCHOSOCIAL ASSESSMENT 08/18/2013  Patient:  Timothy [name], Timothy [name]     Account Number:  [id number]     Admit date:  08/16/2013  Clinical Social Worker:  Hattie Perch  Date/Time:  08/18/2013 12:00 M  Referred by:  Physician  Date Referred:  08/18/2013 Referred for  SNF Placement   Other Referral:   Interview type:  Patient Other interview type:   spouse at bedside    PSYCHOSOCIAL DATA Living Status:  FAMILY Admitted from facility:   Level of care:   Primary support name:  Zahi Plaskett Primary support relationship to patient:  SPOUSE Degree of support available:   good    CURRENT CONCERNS Current Concerns  Post-Acute Placement   Other Concerns:    SOCIAL WORK ASSESSMENT / PLAN CSW met with patient and patient's spouse at bedside. patient had right hip repair last night. discussed probably need for snf placement. Patient and spouse state that they are new to the idea of snf. They are agreeable to being faxed out but will discuss options with their MD.   Assessment/plan status:   Other assessment/ plan:   Information/referral to community resources:    PATIENTS/FAMILYS RESPONSE TO PLAN OF CARE: patient and spouse are pleasant and cooperative. Patient is unhappy with the idea of rehab but understands that it is necessitated based on his medical status.

## 2013-08-18 NOTE — Progress Notes (Signed)
PT Cancellation Note  Patient Details Name: Timothy [name] MRN: [id number] DOB: 1928/09/02   Cancelled Treatment:    Reason Eval/Treat Not Completed: Medical issues which prohibited therapy, Pt has had N/V throughout today.   Rada Hay 08/18/2013, 5:12 PM Blanchard Kelch PT [phone]

## 2013-08-18 NOTE — Progress Notes (Signed)
OT Cancellation Note  Patient Details Name: ELIZAH MIERZWA MRN: [id number] DOB: [birth year]   Cancelled Treatment:     Reason pt not seen for OT assessment: Cancel/medical, Pt reports nausea and unable to participate at this time. Chart review indicates anticipated SNF at d/c. Will re-attempt as able & pt able to tolerate.  Roselie Awkward Dixon 08/18/2013, 12:09 PM

## 2013-08-18 NOTE — Progress Notes (Signed)
[facility] PROGRESS NOTE  RAYAN INES ZOX:[id number] DOB: [birth year] DOA: 08/16/2013 PCP: [name], MD  HPI:  Timothy [name] is a 79 y.o. male who suffered a fall today when getting off of his lawn mower. In ED found to have R hip fracture, Dr. Ophelia Charter consulted.    Assessment/Plan:  Right hip fracture - per ortho, s/p OR on 9/28 Chronic systolic heart failure - most recent EF ~40%. Saw his cardiologist in office at the beginning of September and he was showing signs of fluid overload with edema and weight was 184 lbs. Better after Lasix as outpatient, weight here 174 on admission. On 9/28 he looks compensated without edema or crackles on exam. Continuing his home medications.  - up 1.47 L on 2/29, awaiting today's weight. Discontinue IV fluids as he has good po intake. His breathing is comfortable, no LE edema.  Leukocytosis - likely reactive.  Anemia - s/p #1. Monitor. Will transfuse for Hb < 8 HLD - continue statin Multiple myeloma - followed as an outpatient by Dr. Darnelle Catalan HTN - continue home meds DVT Prophylaxis - per ortho  Code Status: Full Family Communication: wife bedside  Disposition Plan: inpatient; PT evaluation pending  Consultants:  Orthopedic surgery  Procedures:  none  HPI/Subjective: - right hip pain better, but just got his pain medications. No dyspnea. Able to lay flat without discomfort.   Objective: Filed Vitals:   08/17/13 2345 08/18/13 0045 08/18/13 0145 08/18/13 0655  BP: 134/62 94/56 132/72 160/72  Pulse: 90 95 93 90  Temp: 98.4 F (36.9 C) 98.6 F (37 C) 97.9 F (36.6 C) 97.7 F (36.5 C)  TempSrc: Oral Axillary Oral Oral  Resp: 16 18 18 20   Height:      Weight:      SpO2: 97% 96% 93% 94%    Intake/Output Summary (Last 24 hours) at 08/18/13 0817 Last data filed at 08/18/13 0600  Gross per 24 hour  Intake 2831.25 ml  Output   1360 ml  Net 1471.25 ml   Filed Weights   08/16/13 2200  Weight: 78.926 kg (174 lb)     Exam:  General:  NAD  Cardiovascular: regular rate and rhythm, without MRG  Respiratory: no crackles  Abdomen: soft, not tender to palpation, positive bowel sounds  MSK: no edema  Neuro: non focal  Data Reviewed: Basic Metabolic Panel:  Recent Labs Lab 08/13/13 1132 08/16/13 1941 08/18/13 0435  NA 131* 132* 129*  K 4.7 4.2 4.5  CL  --  98 97  CO2 23 23 23   GLUCOSE 124 108* 131*  BUN 20.8 27* 24*  CREATININE 1.2 1.09 1.15  CALCIUM 9.3 9.1 8.7   Liver Function Tests:  Recent Labs Lab 08/13/13 1132 08/16/13 1941  AST 22 25  ALT 12 16  ALKPHOS 49 52  BILITOT 0.58 0.4  PROT 6.8 6.8  ALBUMIN 3.6 3.9   CBC:  Recent Labs Lab 08/13/13 1132 08/16/13 1941 08/18/13 0435  WBC 5.8 15.0* 16.0*  NEUTROABS 3.4 11.6*  --   HGB 10.0* 9.3* 8.4*  HCT 29.9* 27.9* 25.7*  MCV 90.4 89.4 91.1  PLT 167 177 157   BNP (last 3 results)  Recent Labs  12/13/12 0831  PROBNP 84.0   Studies: Dg Chest 1 View  08/16/2013   *RADIOLOGY REPORT*  Clinical Data: Preoperative respiratory examination for hip arthroplasty.  CHEST - 1 VIEW  Comparison: 03/10/2012 and prior chest radiographs  Findings: Cardiomegaly, CABG changes and mild pulmonary vascular congestion  noted. This is a low-volume film with mild bibasilar atelectasis. There is no evidence of focal airspace disease, pulmonary edema, suspicious pulmonary nodule/mass, pleural effusion, or pneumothorax. No acute bony abnormalities are identified.  IMPRESSION: Cardiomegaly with mild pulmonary vascular congestion and mild basilar atelectasis.   Original Report Authenticated By: Harmon Pier, M.D.   Dg Hip Complete Right  08/16/2013   CLINICAL DATA:  Right hip pain post fall  EXAM: RIGHT HIP - COMPLETE 2+ VIEW  COMPARISON:  None  FINDINGS: Diffuse osseous demineralization.  Hip and SI joint spaces preserved.  Degenerative disc and facet disease changes lower lumbar spine.  Displaced intertrochanteric fracture right femur with mild  varus angulation.  No pelvic fracture dislocation identified.  IMPRESSION: Displaced intertrochanteric fracture right femur with varus angulation.   Electronically Signed   By: Ulyses Southward M.D.   On: 08/16/2013 19:10   Dg Femur Right  08/17/2013   CLINICAL DATA:  Right intertrochanteric fracture.  EXAM: RIGHT FEMUR - 2 VIEW; DG C-ARM 1-60 MIN - NRPT [facility]  COMPARISON:  08/16/2013  FINDINGS: Multiple intraoperative spot images demonstrate placement of intramedullary nail and dynamic hip screw across the right intertrochanteric fracture. Near anatomic alignment. No hardware complicating feature.  IMPRESSION: Internal fixation as above.   Electronically Signed   By: Charlett Nose M.D.   On: 08/17/2013 21:27   Dg C-arm 1-60 Min-no Report  08/17/2013   CLINICAL DATA:  Right intertrochanteric fracture.  EXAM: RIGHT FEMUR - 2 VIEW; DG C-ARM 1-60 MIN - NRPT [facility]  COMPARISON:  08/16/2013  FINDINGS: Multiple intraoperative spot images demonstrate placement of intramedullary nail and dynamic hip screw across the right intertrochanteric fracture. Near anatomic alignment. No hardware complicating feature.  IMPRESSION: Internal fixation as above.   Electronically Signed   By: Charlett Nose M.D.   On: 08/17/2013 21:27    Scheduled Meds: . allopurinol  150 mg Oral Daily  . aspirin EC  325 mg Oral BID  . atorvastatin  10 mg Oral Daily  . carvedilol  6.25 mg Oral BID WC  . DULoxetine  30 mg Oral QPM  . lisinopril  20 mg Oral QPM  . tamsulosin  0.4 mg Oral QHS  . vitamin C  500 mg Oral q morning - 10a   Continuous Infusions: . sodium chloride 75 mL/hr at 08/17/13 2145    Principal Problem:   Intertrochanteric fracture of right hip Active Problems:   CAD (coronary artery disease)  Time spent: 25  Pamella Pert, MD [facility] Pager [phone]. If 7 PM - 7 AM, please contact night-coverage at [email or web address], password [facility] 08/18/2013, 8:17 AM  LOS: 2 days

## 2013-08-19 DIAGNOSIS — D62 Acute posthemorrhagic anemia: Secondary | ICD-10-CM

## 2013-08-19 DIAGNOSIS — D649 Anemia, unspecified: Secondary | ICD-10-CM

## 2013-08-19 LAB — CBC
MCV: 89.8 fL (ref 78.0–100.0)
Platelets: 128 10*3/uL — ABNORMAL LOW (ref 150–400)
RDW: 16.2 % — ABNORMAL HIGH (ref 11.5–15.5)
WBC: 11.7 10*3/uL — ABNORMAL HIGH (ref 4.0–10.5)

## 2013-08-19 LAB — PREPARE RBC (CROSSMATCH)

## 2013-08-19 LAB — BASIC METABOLIC PANEL
Chloride: 96 mEq/L (ref 96–112)
Creatinine, Ser: 1.35 mg/dL (ref 0.50–1.35)
GFR calc Af Amer: 55 mL/min — ABNORMAL LOW (ref >90)
Potassium: 4.2 mEq/L (ref 3.5–5.1)

## 2013-08-19 MED ORDER — LACTATED RINGERS IV SOLN
INTRAVENOUS | Status: DC
  Administered 2013-08-19: 07:00:00 via INTRAVENOUS

## 2013-08-19 MED ORDER — ACETAMINOPHEN 325 MG PO TABS
650.0000 mg | ORAL_TABLET | Freq: Once | ORAL | Status: AC
  Administered 2013-08-19: 650 mg via ORAL
  Filled 2013-08-19: qty 2

## 2013-08-19 MED ORDER — SODIUM CHLORIDE 0.9 % IV BOLUS (SEPSIS)
250.0000 mL | Freq: Once | INTRAVENOUS | Status: AC
  Administered 2013-08-19: 08:00:00 via INTRAVENOUS

## 2013-08-19 MED ORDER — FUROSEMIDE 10 MG/ML IJ SOLN
20.0000 mg | Freq: Once | INTRAMUSCULAR | Status: AC
  Administered 2013-08-19: 20 mg via INTRAVENOUS
  Filled 2013-08-19: qty 2

## 2013-08-19 MED ORDER — DIPHENHYDRAMINE HCL 50 MG/ML IJ SOLN
25.0000 mg | Freq: Once | INTRAMUSCULAR | Status: AC
  Administered 2013-08-19: 09:00:00 25 mg via INTRAVENOUS
  Filled 2013-08-19: qty 1

## 2013-08-19 NOTE — Progress Notes (Signed)
CSW met with patient and spouse at bedside. CSW provided them with bed offers. CSW to follow up for choice.  Lavren Lewan C. Anniston Nellums MSW, LCSW [phone]

## 2013-08-19 NOTE — Progress Notes (Signed)
Subjective: 2 Days Post-Op Procedure(s) (LRB): INTRAMEDULLARY (IM) NAIL INTERTROCHANTRIC (Right) Patient reports pain as moderate.    Objective: Vital signs in last 24 hours: Temp:  [97.7 F (36.5 C)-99 F (37.2 C)] 97.9 F (36.6 C) (09/30 1515) Pulse Rate:  [61-87] 78 (09/30 1515) Resp:  [16-24] 18 (09/30 1600) BP: (97-123)/(47-64) 121/62 mmHg (09/30 1515) SpO2:  [84 %-100 %] 97 % (09/30 1600) Weight:  [79.9 kg (176 lb 2.4 oz)] 79.9 kg (176 lb 2.4 oz) (09/30 0511)  Intake/Output from previous day: 09/29 0701 - 09/30 0700 In: 1234.8 [P.O.:721; I.V.:513.8] Out: 1025 [Urine:1025] Intake/Output this shift: Total I/O In: 753 [P.O.:120; Blood:633] Out: 700 [Urine:700]   Recent Labs  08/16/13 1941 08/18/13 0435 08/19/13 0441  HGB 9.3* 8.4* 6.7*    Recent Labs  08/18/13 0435 08/19/13 0441  WBC 16.0* 11.7*  RBC 2.82* 2.26*  HCT 25.7* 20.3*  PLT 157 128*    Recent Labs  08/18/13 0435 08/19/13 0441  NA 129* 129*  K 4.5 4.2  CL 97 96  CO2 23 25  BUN 24* 36*  CREATININE 1.15 1.35  GLUCOSE 131* 135*  CALCIUM 8.7 8.8   No results found for this basename: LABPT, INR,  in the last 72 hours  Neurologically intact  Has normal sciatic motor and sensory testing right LE.  Mild swelling in hip region from bleeding from hip fracture which was comminuted.    Assessment/Plan: 2 Days Post-Op Procedure(s) (LRB): INTRAMEDULLARY (IM) NAIL INTERTROCHANTRIC (Right)   Post fracture acute blood loss anemia.  Agree and appreciate transfusion.  Should do better after blood and hip fracture mild hematoma will give him some increased soreness in his hip which should resolve in a few days. Agree with rehab plan  Timothy [name] 08/19/2013, 6:22 PM

## 2013-08-19 NOTE — Evaluation (Signed)
Physical Therapy Evaluation Patient Details Name: Timothy [name] MRN: [id number] DOB: 07-Feb-1928 Today's Date: 08/19/2013 Time: 0865-7846 PT Time Calculation (min): 26 min  PT Assessment / Plan / Recommendation History of Present Illness   79 y.o. male who suffered a fall today when getting off of his lawn mower.  Fall was mechanical in nature (shorts got caught up in the handle bar of his mower), and he landed on cement.  This resulted in R hip pain, worse with movement, better at rest.  Unable to bear weight.  EMS called and patient brought to ED. Pt sustained R hip intertrochanteric fx. S/P nailing 9/29  Clinical Impression  On eval, pt required +2 assist for mobility-able to stand and take a few side steps at side of bed. Demonstrates general weakness, impaired static and dynamic balance, decreased activity tolerance, and impaired gait. Feel pt could possibly benefit from CIR consult. If CIR not an option, pt will need SNF. Will follow during stay.     PT Assessment  Patient needs continued PT services    Follow Up Recommendations  CIR (SNF if CIR not an option)    Does the patient have the potential to tolerate intense rehabilitation      Barriers to Discharge        Equipment Recommendations  None recommended by PT    Recommendations for Other Services OT consult   Frequency Min 3X/week    Precautions / Restrictions Precautions Precautions: Fall Restrictions Weight Bearing Restrictions: No RLE Weight Bearing: Weight bearing as tolerated   Pertinent Vitals/Pain R LE 7/10 with activity. Pre-medicated and ice applied end of session      Mobility  Bed Mobility Bed Mobility: Supine to Sit;Sit to Supine Supine to Sit: 2: Max assist;HOB elevated Sit to Supine: 1: +2 Total assist;HOB flat Sit to Supine: Patient Percentage: 40% Details for Bed Mobility Assistance: Assist for trunk and bil LEs. Increased time. VCs safety, technique, hand placement.  Transfers Transfers:  Sit to Stand;Stand to Sit Sit to Stand: 1: +2 Total assist;From elevated surface Sit to Stand: Patient Percentage: 40% Stand to Sit: 1: +2 Total assist;To elevated surface Stand to Sit: Patient Percentage: 40% Details for Transfer Assistance: Assist to rise, stabilize, control descent. VCs safety, technique, hand placement. Attempted stand pivot but pt declined/requested to return to bed. 2-3 side steps to [facility] with RW. Difficulty moving R LE/taking weight through R LE    Exercises     PT Diagnosis: Difficulty walking;Abnormality of gait;Generalized weakness;Acute pain  PT Problem List: Decreased strength;Decreased range of motion;Decreased activity tolerance;Decreased balance;Decreased mobility;Pain;Decreased knowledge of precautions;Decreased knowledge of use of DME PT Treatment Interventions: Gait training;DME instruction;Functional mobility training;Therapeutic activities;Therapeutic exercise;Patient/family education;Balance training     PT Goals(Current goals can be found in the care plan section) Acute Rehab PT Goals Patient Stated Goal: regain independence PT Goal Formulation: With patient/family Time For Goal Achievement: 09/02/13 Potential to Achieve Goals: Good  Visit Information  Last PT Received On: 08/19/13 Assistance Needed: +2 History of Present Illness:  79 y.o. male who suffered a fall today when getting off of his [employer].  Fall was mechanical in nature (shorts got caught up in the handle bar of his mower), and he landed on cement.  This resulted in R hip pain, worse with movement, better at rest.  Unable to bear weight.  EMS called and patient brought to ED. Pt sustained R hip intertrochanteric fx. S/P nailing 9/29       Prior Functioning  Home Living Family/patient expects to be discharged to:: Private residence Living Arrangements: Spouse/significant other Available Help at Discharge: Family Type of Home: House Home Access: Stairs to enter [employer] of Steps: 1 Entrance Stairs-Rails: None Home Layout: Two level;Able to live on main level with bedroom/bathroom Prior Function Level of Independence: Independent Communication Communication: No difficulties    Cognition  Cognition Arousal/Alertness: Awake/alert Behavior During Therapy: WFL for tasks assessed/performed Overall Cognitive Status: Within Functional Limits for tasks assessed    Extremity/Trunk Assessment Upper Extremity Assessment Upper Extremity Assessment: Defer to OT evaluation Lower Extremity Assessment Lower Extremity Assessment: RLE deficits/detail RLE Deficits / Details: moves ankle. hip abd/add 2/5.  RLE: Unable to fully assess due to pain Cervical / Trunk Assessment Cervical / Trunk Assessment: Normal   Balance Balance Balance Assessed: Yes Static Sitting Balance Static Sitting - Balance Support: Bilateral upper extremity supported;Feet supported Static Sitting - Level of Assistance: 4: Min assist Static Sitting - Comment/# of Minutes: Pt leaning posteriorly-Min assist to stabilize.  Static Standing Balance Static Standing - Balance Support: Bilateral upper extremity supported Static Standing - Level of Assistance: 2: Max assist  End of Session PT - End of Session Equipment Utilized During Treatment: Gait belt Activity Tolerance: Patient limited by pain;Patient limited by fatigue Patient left: in bed;with call bell/phone within reach;with family/visitor present Nurse Communication: Mobility status  GP     Rebeca Alert, MPT Pager: [phone]

## 2013-08-19 NOTE — Progress Notes (Signed)
Rehab Admissions Coordinator Note:  Patient was screened by Clois Dupes for appropriateness for an Inpatient Acute Rehab Consult.  At this time, we are recommending Inpatient Rehab consult. I will contact MD.  Clois Dupes 08/19/2013, 5:30 PM  I can be reached at [phone].

## 2013-08-19 NOTE — Progress Notes (Signed)
[facility] PROGRESS NOTE  Timothy [name]:[id number] DOB: Dec 22, 1927 DOA: 08/16/2013 PCP: [name], MD  HPI:  Timothy [name] is a 79 y.o. male who suffered a fall today when getting off of his lawn mower. In ED found to have R hip fracture, Dr. Ophelia Charter consulted.    Assessment/Plan:  Right hip fracture - per ortho, s/p OR on 9/28 Chronic systolic heart failure - most recent EF ~40%. Saw his cardiologist in office at the beginning of September and he was showing signs of fluid overload with edema and weight was 184 lbs. Better after Lasix as outpatient, weight here 174 on admission. On 9/28 he looks compensated without edema or crackles on exam. Continuing his home medications.  - will avoid continuous IV fluids.  - he is up 2 lbs today when compared to yesterday - BUN/Cr shows slight intravascular depletion, gentle IV bolus and monitor. Today he will also receive volume with his blood transfusion. Monitor renal function in am.   Acute blood loss anemia - transfuse this morning 1U pRBC Leukocytosis - likely reactive.  HLD - continue statin Multiple myeloma - followed as an outpatient by Dr. Darnelle Catalan HTN - continue home meds DVT Prophylaxis - per ortho  Code Status: Full Family Communication: wife bedside  Disposition Plan: inpatient; rehab at discharge  Consultants:  Orthopedic surgery  Procedures:  none  HPI/Subjective: - feeling better, concerned about his blood loss  Objective: Filed Vitals:   08/19/13 0200 08/19/13 0205 08/19/13 0400 08/19/13 0511  BP: 123/64   110/58  Pulse: 87   82  Temp: 99 F (37.2 C)   97.8 F (36.6 C)  TempSrc: Oral   Oral  Resp: 24 20 20 19   Height:      Weight:    79.9 kg (176 lb 2.4 oz)  SpO2: 84% 97% 97% 100%    Intake/Output Summary (Last 24 hours) at 08/19/13 0715 Last data filed at 08/19/13 0657  Gross per 24 hour  Intake 1234.83 ml  Output   1025 ml  Net 209.83 ml   Filed Weights   08/16/13 2200 08/19/13  0511  Weight: 78.926 kg (174 lb) 79.9 kg (176 lb 2.4 oz)    Exam:  General:  NAD  Cardiovascular: regular rate and rhythm, without MRG  Respiratory: no crackles  Abdomen: soft, not tender to palpation, positive bowel sounds  MSK: no edema  Neuro: non focal  Data Reviewed: Basic Metabolic Panel:  Recent Labs Lab 08/13/13 1132 08/16/13 1941 08/18/13 0435 08/19/13 0441  NA 131* 132* 129* 129*  K 4.7 4.2 4.5 4.2  CL  --  98 97 96  CO2 23 23 23 25   GLUCOSE 124 108* 131* 135*  BUN 20.8 27* 24* 36*  CREATININE 1.2 1.09 1.15 1.35  CALCIUM 9.3 9.1 8.7 8.8   Liver Function Tests:  Recent Labs Lab 08/13/13 1132 08/16/13 1941  AST 22 25  ALT 12 16  ALKPHOS 49 52  BILITOT 0.58 0.4  PROT 6.8 6.8  ALBUMIN 3.6 3.9   CBC:  Recent Labs Lab 08/13/13 1132 08/16/13 1941 08/18/13 0435 08/19/13 0441  WBC 5.8 15.0* 16.0* 11.7*  NEUTROABS 3.4 11.6*  --   --   HGB 10.0* 9.3* 8.4* 6.7*  HCT 29.9* 27.9* 25.7* 20.3*  MCV 90.4 89.4 91.1 89.8  PLT 167 177 157 128*   BNP (last 3 results)  Recent Labs  12/13/12 0831  PROBNP 84.0   Studies: Dg Femur Right  08/17/2013  CLINICAL DATA:  Right intertrochanteric fracture.  EXAM: RIGHT FEMUR - 2 VIEW; DG C-ARM 1-60 MIN - NRPT [facility]  COMPARISON:  08/16/2013  FINDINGS: Multiple intraoperative spot images demonstrate placement of intramedullary nail and dynamic hip screw across the right intertrochanteric fracture. Near anatomic alignment. No hardware complicating feature.  IMPRESSION: Internal fixation as above.   Electronically Signed   By: Charlett Nose M.D.   On: 08/17/2013 21:27   Dg C-arm 1-60 Min-no Report  08/17/2013   CLINICAL DATA:  Right intertrochanteric fracture.  EXAM: RIGHT FEMUR - 2 VIEW; DG C-ARM 1-60 MIN - NRPT [facility]  COMPARISON:  08/16/2013  FINDINGS: Multiple intraoperative spot images demonstrate placement of intramedullary nail and dynamic hip screw across the right intertrochanteric fracture. Near anatomic  alignment. No hardware complicating feature.  IMPRESSION: Internal fixation as above.   Electronically Signed   By: Charlett Nose M.D.   On: 08/17/2013 21:27    Scheduled Meds: . acetaminophen  650 mg Oral Once  . allopurinol  150 mg Oral Daily  . aspirin EC  325 mg Oral BID  . atorvastatin  10 mg Oral Daily  . calcium carbonate  1 tablet Oral BID WC  . carvedilol  6.25 mg Oral BID WC  . diphenhydrAMINE  25 mg Intravenous Once  . DULoxetine  30 mg Oral QPM  . furosemide  20 mg Intravenous Once  . lisinopril  20 mg Oral QPM  . polyethylene glycol  17 g Oral Daily  . sodium chloride  250 mL Intravenous Once  . tamsulosin  0.4 mg Oral QHS  . vitamin C  500 mg Oral q morning - 10a   Continuous Infusions: . sodium chloride 10 mL/hr at 08/18/13 1641    Principal Problem:   Intertrochanteric fracture of right hip Active Problems:   CAD (coronary artery disease)  Time spent: 25  Timothy Pert, MD [facility] Pager [phone]. If 7 PM - 7 AM, please contact night-coverage at [email or web address], password [facility] 08/19/2013, 7:15 AM  LOS: 3 days

## 2013-08-19 NOTE — Clinical Documentation Improvement (Signed)
THIS DOCUMENT IS NOT A PERMANENT PART OF THE MEDICAL RECORD  Please update your documentation within the medical record to reflect your response to this query. If you need help knowing how to do this please call [phone].  08/19/13  Dear Dr. Elvera Lennox,  In a better effort to capture your patient's severity of illness, reflect appropriate length of stay and utilization of resources, a review of the medical record has revealed the following indicators.   Based on your clinical judgment, please clarify and document in a progress note and/or discharge summary the clinical condition associated with the following supporting information: In responding to this query please exercise your independent judgment.  The fact that a query is asked, does not imply that any particular answer is desired or expected.   Hello Dr. Elvera Lennox!  Abnormal findings (laboratory, x-ray, pathologic, and other diagnostic results) are not coded and reported unless the physician indicates their clinical significance.   The medical record reflects the following 2 clinical findings, please clarify the diagnostic and/or clinical significance:      1.  Component      Hemoglobin HCT  Latest Ref Rng      13.0 - 17.0 g/dL 09.8 - 11.9 %  1/47/8295      10.5 (L) 31.6 (L)  01/08/2013         08/16/2013      9.3 (L) 27.9 (L)  08/18/2013      8.4 (L) 25.7 (L)  08/19/2013      6.7 (LL) 20.3 (L)   Possible Clinical Conditions?  - Acute Blood Loss Anemia on Chronic Anemia  - Acute Blood Loss Anemia  - Chronic Anemia (TYPE) _______  - Other condition (please document in the progress notes and/or discharge summary)  - Cannot Clinically determine at this time   Additional Supporting Information:  - Transfuse 2 units PRBCs ordered 9/30    2. Component      Sodium  Latest Ref Rng      135 - 145 mEq/L  08/16/2013      132 (L)  08/18/2013      129 (L)  08/19/2013      129 (L)   Possible Clinical Conditions?  -  Hyponatremia  - Other condition (please document in the progress notes and/or discharge summary)  - Cannot Clinically determine at this time       Reviewed: additional documentation in the medical record   Thank You,  Saul Fordyce  Clinical Documentation Specialist: [phone] Health Information Management 

## 2013-08-19 NOTE — Progress Notes (Signed)
PT NOTE  Order received. Chart reviewed. Noted pt's Hgb is 6.7 and pt is scheduled to receive 2 units of blood. Will check back later today to see if pt is able to participate with PT. Thanks. Rebeca Alert, PT [phone]

## 2013-08-19 NOTE — Clinical Social Work Placement (Unsigned)
     Clinical Social Work Department CLINICAL SOCIAL WORK PLACEMENT NOTE 08/19/2013  Patient:  Timothy [name], Timothy [name]  Account Number:  [id number] Admit date:  08/16/2013  Clinical Social Worker:  Becky Sax, LCSW  Date/time:  08/19/2013 12:00 M  Clinical Social Work is seeking post-discharge placement for this patient at the following level of care:   SKILLED NURSING   (*CSW will update this form in Epic as items are completed)   08/19/2013  Patient/family provided with [facility] Department of Clinical Social Works list of facilities offering this level of care within the geographic area requested by the patient (or if unable, by the patients family).  08/19/2013  Patient/family informed of their freedom to choose among providers that offer the needed level of care, that participate in Medicare, Medicaid or managed care program needed by the patient, have an available bed and are willing to accept the patient.  08/19/2013  Patient/family informed of [facility] ownership interest in [facility], as well as of the fact that they are under no obligation to receive care at this facility.  PASARR submitted to [employer] on 08/19/2013 PASARR number received from [employer] on 08/19/2013  FL2 transmitted to all facilities in geographic area requested by pt/family on  08/19/2013 FL2 transmitted to all facilities within larger geographic area on 08/19/2013  Patient informed that his/her managed care company has contracts with or will negotiate with  certain facilities, including the following:     Patient/family informed of bed offers received:  08/19/2013 Patient chooses bed at  Physician recommends and patient chooses bed at    Patient to be transferred to  on   Patient to be transferred to facility by   The following physician request were entered in Epic:   Additional Comments:

## 2013-08-19 NOTE — Progress Notes (Signed)
OT Cancellation Note  Patient Details Name: Timothy [name] MRN: [id number] DOB: 1928/06/27   Cancelled Treatment:    Reason Eval/Treat Not Completed: Other (comment) (pt getting blood)  Lennox Laity [phone] 08/19/2013, 12:55 PM

## 2013-08-20 DIAGNOSIS — S72143A Displaced intertrochanteric fracture of unspecified femur, initial encounter for closed fracture: Secondary | ICD-10-CM

## 2013-08-20 DIAGNOSIS — D62 Acute posthemorrhagic anemia: Secondary | ICD-10-CM

## 2013-08-20 DIAGNOSIS — R296 Repeated falls: Secondary | ICD-10-CM

## 2013-08-20 LAB — BASIC METABOLIC PANEL
CO2: 25 mEq/L (ref 19–32)
Calcium: 8.8 mg/dL (ref 8.4–10.5)
Chloride: 96 mEq/L (ref 96–112)
GFR calc Af Amer: 55 mL/min — ABNORMAL LOW (ref >90)
GFR calc non Af Amer: 48 mL/min — ABNORMAL LOW (ref >90)
Potassium: 4.3 mEq/L (ref 3.5–5.1)
Sodium: 129 mEq/L — ABNORMAL LOW (ref 135–145)

## 2013-08-20 LAB — CBC
MCH: 30 pg (ref 26.0–34.0)
MCHC: 34 g/dL (ref 30.0–36.0)
Platelets: 103 10*3/uL — ABNORMAL LOW (ref 150–400)
RBC: 2.73 MIL/uL — ABNORMAL LOW (ref 4.22–5.81)
RDW: 16.3 % — ABNORMAL HIGH (ref 11.5–15.5)
WBC: 10.4 10*3/uL (ref 4.0–10.5)

## 2013-08-20 LAB — TYPE AND SCREEN
ABO/RH(D): B NEG
Antibody Screen: NEGATIVE
Unit division: 0
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
## 2013-08-20 NOTE — Progress Notes (Signed)
I met with patient and his wife at bedside. Both are in agreement to admission to inpt rehab and I do have a bed available for tomorrow. I will alert RN CM and SW. I will follow up in the morning and arrange admission. [phone]

## 2013-08-20 NOTE — Evaluation (Signed)
Occupational Therapy Evaluation Patient Details Name: Timothy [name] MRN: [id number] DOB: 1928/05/20 Today's Date: 08/20/2013 Time: 1135-1209 OT Time Calculation (min): 34 min  OT Assessment / Plan / Recommendation History of present illness  79 y.o. male who suffered a fall when getting off of his lawn mower.  Fall was mechanical in nature (shorts got caught up in the handle bar of his mower), and he landed on cement.  This resulted in R hip pain, worse with movement, better at rest.  Unable to bear weight.  EMS called and patient brought to ED. Pt sustained R hip intertrochanteric fx. S/P nailing 9/29   Clinical Impression   Pt currently demonstrates significant deficits in ability to perform ADL's and self care tasks s/p R hip fracture. He is +2 total assist and will benefit from acute OT followed by CIR/In-pt Rehab to assist in increasing independence w/ ADL's and return to PLOF. Will follow acutely.    OT Assessment  Patient needs continued OT Services    Follow Up Recommendations  CIR    Barriers to Discharge      Equipment Recommendations  None recommended by OT    Recommendations for Other Services Rehab consult  Frequency  Min 2X/week    Precautions / Restrictions Precautions Precautions: Fall Restrictions Weight Bearing Restrictions: Yes RLE Weight Bearing: Weight bearing as tolerated   Pertinent Vitals/Pain 5/10 R hip pain. Pt was pre-medicated prior to assessment. Repositioned, increased activity, rest. Pt O2 Sats fluctuate during assessment & treatment session, pt requires frequent vc's for pursed lip breathing. Pt on 2L O2 via nasal cannula throughout session and was w/ noted Sats in mid 90's-84 & back up to 90's w/ vc's for breathing techniques.    ADL  Eating/Feeding: Simulated;Set up Where Assessed - Eating/Feeding: Bed level Grooming: Simulated;Set up (Pt w/ posterior lean in sitting) Where Assessed - Grooming: Supported sitting Upper Body Bathing:  Simulated;Moderate assistance (Posterior lean in sitting) Where Assessed - Upper Body Bathing: Supported sitting;Unsupported sitting Lower Body Bathing: Simulated;+2 Total assistance Lower Body Bathing: Patient Percentage: 30% Where Assessed - Lower Body Bathing: Supported sit to stand Upper Body Dressing: Simulated;Moderate assistance Where Assessed - Upper Body Dressing: Supported sitting Lower Body Dressing: Performed;+2 Total assistance Lower Body Dressing: Patient Percentage: 30% Where Assessed - Lower Body Dressing: Supported sit to stand Toilet Transfer: Simulated;+2 Total assistance (SPT from EOB to chair) Toilet Transfer: Patient Percentage: 40% Toilet Transfer Method: Sit to stand;Stand pivot Acupuncturist: Materials engineer and Hygiene: Simulated;+2 Total assistance Toileting - Architect and Hygiene: Patient Percentage: 0% Where Assessed - Toileting Clothing Manipulation and Hygiene: Sit to stand from 3-in-1 or toilet Tub/Shower Transfer Method: Not assessed Equipment Used: Gait belt;Rolling walker;Upper extremity splints (L wrist prefab wrist cock up splint.) Transfers/Ambulation Related to ADLs: Pt is currently +2 total assist for sit to stand and SPT from EOB to chair, pt 40%. Pt with significant posterior lean in standing & narrow base of stance. Needs assist to advance R LE. ADL Comments: Pt was educated in Role of OT and participated in SPT in preparation for increased participation in functional mobility related to ADL's. Pt is +2 total assist, 40%. Static sitting @ EOB x45min w/ Min assist, static standing balance also w/ +2 total assist secondary to posterior lean.    OT Diagnosis: Generalized weakness;Acute pain  OT Problem List: Decreased activity tolerance;Impaired balance (sitting and/or standing);Decreased knowledge of use of DME or AE;Cardiopulmonary status limiting activity;Pain;Impaired UE functional  use;Decreased strength OT Treatment Interventions:     OT Goals(Current goals can be found in the care plan section) Acute Rehab OT Goals Patient Stated Goal: Increase independence OT Goal Formulation: With patient Time For Goal Achievement: 09/03/13 Potential to Achieve Goals: Good  Visit Information  Last OT Received On: 08/20/13 Assistance Needed: +2 History of Present Illness:  79 y.o. male who suffered a fall when getting off of his lawn mower.  Fall was mechanical in nature (shorts got caught up in the handle bar of his mower), and he landed on cement.  This resulted in R hip pain, worse with movement, better at rest.  Unable to bear weight.  EMS called and patient brought to ED. Pt sustained R hip intertrochanteric fx. S/P nailing 9/29       Prior Functioning     Home Living Family/patient expects to be discharged to:: Private residence Living Arrangements: Spouse/significant other Available Help at Discharge: Family Type of Home: House Home Access: Stairs to enter [employer] of Steps: 1 Entrance Stairs-Rails: None Home Layout: Two level;Able to live on main level with bedroom/bathroom Home Equipment: Dan Humphreys - 2 wheels;Bedside commode;Wheelchair - manual Prior Function Level of Independence: Independent Communication Communication: No difficulties Dominant Hand: Left    Vision/Perception Vision - History Baseline Vision: Other (comment) (Has glasses.) Visual History: Macular degeneration ("I get shots for that") Patient Visual Report: No change from baseline   Cognition  Cognition Arousal/Alertness: Awake/alert Behavior During Therapy: WFL for tasks assessed/performed Overall Cognitive Status: Within Functional Limits for tasks assessed    Extremity/Trunk Assessment Upper Extremity Assessment Upper Extremity Assessment: LUE deficits/detail LUE Deficits / Details: Pt reports L wrist fracture in July 2014, wears wrist cock-up spint during functional  activity/WB w/ RW per his report. Pt is L HD Lower Extremity Assessment Lower Extremity Assessment: Defer to PT evaluation RLE Deficits / Details: moves ankle. hip abd/add 2/5.  RLE: Unable to fully assess due to pain Cervical / Trunk Assessment Cervical / Trunk Assessment: Normal    Mobility Bed Mobility Bed Mobility: Supine to Sit;Sitting - Scoot to Edge of Bed Supine to Sit: 2: Max assist;HOB elevated Sitting - Scoot to [place] of Bed: With rail;1: +1 Total assist Details for Bed Mobility Assistance: Assist for trunk and bil LEs. Increased time. VCs safety, technique, hand placement.  Transfers Transfers: Sit to Stand;Stand to Sit Sit to Stand: 1: +2 Total assist;With upper extremity assist;From elevated surface;From bed Sit to Stand: Patient Percentage: 40% Stand to Sit: 1: +2 Total assist;To elevated surface;To chair/3-in-1;With upper extremity assist Stand to Sit: Patient Percentage: 40% Details for Transfer Assistance: Assist to rise, stabilize, control descent. VCs safety, technique, hand placement. Stand pivot w/ significant difficulty moving R LE/taking weight through R LE, posterior lean and narrow base of stance.        Balance Balance Balance Assessed: Yes Static Sitting Balance Static Sitting - Balance Support: Bilateral upper extremity supported;Feet supported Static Sitting - Level of Assistance: 4: Min assist Static Sitting - Comment/# of Minutes: Posterior lean in sitting and standing noted. Assist to stabilize Static Standing Balance Static Standing - Balance Support: Bilateral upper extremity supported Static Standing - Level of Assistance: 1: +2 Total assist Static Standing - Comment/# of Minutes: Narrow base of stance and significant posterior lean noted, pt w/ difficulty bearing weight through R LE and widening stance for safer standing w/ RW.   End of Session OT - End of Session Equipment Utilized During Treatment: Gait belt;Rolling walker Activity Tolerance:  Patient limited by pain Patient left: in chair;with call bell/phone within reach;with family/visitor present Nurse Communication: Mobility status  GO     Alm Bustard 08/20/2013, 12:35 PM

## 2013-08-20 NOTE — Progress Notes (Signed)
Pt not yet voided since foley catheter removal 08/19/13 at 1855. Bladder scan performed 6 hrs post removal and revealed . Bladder scan performed again 8 hrs post removal revealing . Pt does not c/o discomfort or the urge to urinate. NP on call notified. No new orders received. Will continue to monitor.

## 2013-08-20 NOTE — Progress Notes (Signed)
Patient ID: Timothy [name], male   DOB: 31-Mar-1928, 79 y.o.   MRN: [id number]  [facility] PROGRESS NOTE  TEAL BONTRAGER NWG:[id number] DOB: 1928/03/06 DOA: 08/16/2013 PCP: Timothy Dell, MD  Timothy [name] is a 79 y.o. male who suffered a fall today when getting off of his lawn mower. In Timothy found to have R hip fracture, Dr. Ophelia Charter consulted.   Assessment/Plan:  Right hip fracture - per ortho, s/p OR on 9/28, plan for IR at [facility] in AM  Chronic systolic heart failure - most recent EF ~40%. Saw his cardiologist in office at the beginning of September and he was showing signs of fluid overload with edema and weight was 184 lbs. Better after Lasix as outpatient, weight 174 on admission. Weight is stable, continue to monitor I's and O's, daily weights. Acute blood loss anemia - transfused1U pRBC with appropriate increase in Hg, repeat CBC in AM Leukocytosis - likely reactive.  HLD - continue statin  Multiple myeloma - followed as an outpatient by Dr. Darnelle Catalan  HTN - continue home meds  DVT Prophylaxis - per ortho   Code Status: Full  Family Communication: wife bedside  Disposition Plan: inpatient; rehab at discharge   Consultants:  Orthopedic surgery  HPI/Subjective: No events overnight.   Objective: Filed Vitals:   08/19/13 2052 08/20/13 0140 08/20/13 0613 08/20/13 0942  BP: 115/59 104/64 119/63 129/51  Pulse: 81 78 77 80  Temp: 98.1 F (36.7 C) 98.2 F (36.8 C) 97.7 F (36.5 C) 97.2 F (36.2 C)  TempSrc: Oral Oral Oral Oral  Resp: 18 18 19 23   Height:      Weight:      SpO2: 99% 98% 99% 99%    Intake/Output Summary (Last 24 hours) at 08/20/13 1655 Last data filed at 08/20/13 0700  Gross per 24 hour  Intake  480.5 ml  Output    425 ml  Net   55.5 ml    Exam:   General:  Pt is alert, follows commands appropriately, not in acute distress  Cardiovascular: Regular rate and rhythm, S1/S2, no murmurs, no rubs, no gallops  Respiratory: Clear to auscultation  bilaterally, no wheezing, no crackles, no rhonchi  Abdomen: Soft, non tender, non distended, bowel sounds present, no guarding  Extremities: No edema, pulses DP and PT palpable bilaterally  Neuro: Grossly nonfocal  Data Reviewed: Basic Metabolic Panel:  Recent Labs Lab 08/16/13 1941 08/18/13 0435 08/19/13 0441 08/20/13 0445  NA 132* 129* 129* 129*  K 4.2 4.5 4.2 4.3  CL 98 97 96 96  CO2 23 23 25 25   GLUCOSE 108* 131* 135* 119*  BUN 27* 24* 36* 51*  CREATININE 1.09 1.15 1.35 1.34  CALCIUM 9.1 8.7 8.8 8.8   Liver Function Tests:  Recent Labs Lab 08/16/13 1941  AST 25  ALT 16  ALKPHOS 52  BILITOT 0.4  PROT 6.8  ALBUMIN 3.9   CBC:  Recent Labs Lab 08/16/13 1941 08/18/13 0435 08/19/13 0441 08/20/13 0445  WBC 15.0* 16.0* 11.7* 10.4  NEUTROABS 11.6*  --   --   --   HGB 9.3* 8.4* 6.7* 8.2*  HCT 27.9* 25.7* 20.3* 24.1*  MCV 89.4 91.1 89.8 88.3  PLT 177 157 128* 103*    Scheduled Meds: . allopurinol  150 mg Oral Daily  . aspirin EC  325 mg Oral BID  . atorvastatin  10 mg Oral Daily  . calcium carbonate  1 tablet Oral BID WC  . carvedilol  6.25 mg  Oral BID WC  . DULoxetine  30 mg Oral QPM  . lisinopril  20 mg Oral QPM  . polyethylene glycol  17 g Oral Daily  . tamsulosin  0.4 mg Oral QHS  . vitamin C  500 mg Oral q morning - 10a   Continuous Infusions: . sodium chloride 10 mL/hr at 08/18/13 1641    Debbora Presto, MD  [facility] Pager [phone]  If 7PM-7AM, please contact night-coverage [email or web address] Password [email or web address] 08/20/2013, 4:55 PM   LOS: 4 days

## 2013-08-20 NOTE — Consult Note (Signed)
Physical Medicine and Rehabilitation Consult   Reason for Consult: Right hip fracture Referring Physician: Dr. Elvera Lennox.    HPI: Timothy [name] is a 79 y.o. male with history of CAD, peripheral neuropathy, MM, lumbago; who fell while getting off his lawnmower on 08/16/13, landed on cement with resultant right hip pain and inability to walk. Patient reported shorts getting caught on handle bar. Xrays with right IT hip fracture with lesser trochanter as separate piece. Patient taken to OR on 08/18/13 for right trochanteric nail with pin lag screw by Dr. Ophelia Charter. Noted to have mild hematoma right hip. Post op with ABLA and transfused with 2 units PRBC as hgb 6.7. Has had poor UOP as well as worsening of chronic hyponatremia.  PT evaluation done yesterday with limitation in mobility as well as ability to move RLE. MD, PT recommending CIR.    Review of Systems  Constitutional: Negative for fever and chills.  HENT: Negative for hearing loss.   Eyes: Positive for blurred vision. Negative for double vision.  Cardiovascular: Negative for chest pain and palpitations.  Musculoskeletal: Positive for back pain, joint pain and falls.  Neurological: Positive for focal weakness. Negative for headaches.   Past Medical History  Diagnosis Date  . ASCVD (arteriosclerotic cardiovascular disease)   . Anemia     chronic mild anemia  . Gout   . Hypertension   . Lumbar disc disease     post laminectomy  . Chronic back pain   . History of echocardiogram 11/02/2010    EF range of 30 to 35% / There is hypokinsesis of the basal-mild inferolateral myocardium  . CHF (congestive heart failure)   . Heart murmur   . Ischemic cardiomyopathy   . Renal insufficiency   . Peripheral neuropathy   . Inferior myocardial infarction 1986  . Hyperlipidemia   . SOB (shortness of breath)   . Fatigue   . Peripheral neuropathy   . Coronary artery disease     CABG 2002 by [name] with LIMA to LAD, SVG to intermediate, SVG to  LCX, & SVG to PL. Last nuclear in 2012 showing large inferior scar with EF of 39%.   . Blood transfusion     DEC 2012 - TWO UNITS  . 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  PT HAS BEEN OFF CHEMO SINCE HIS HOSPITALIZATION FEB 2013 FOR LEFT FOOT INFECTION  . Cancer   . History of shingles MARCH 2009    SHINGLE LESIONS WERE AROUND RIGHT EYE--PT HAS RESIDUAL ITCHING AROUND THE EYE.  . Osteomyelitis     s/p toe amputation  . Chronic systolic heart failure     EF of 30% per echo 02/2012  . Protein malnutrition    Past Surgical History  Procedure Laterality Date  . Laminectomy    . Inguinal hernia repair    . Cardiac catheterization  06/20/2000    Severe coronary disease (totally occluded right artery, 90% left circumflex, 50-60% intermediate, and 90-95% ostial left anterior descending)   . Tonsillectomy    . Coronary artery bypass graft  2002    x4 / Left internal mammary artery to the LAD.  / Saphenous vein graft to the right posterolateral.  /  Saphenous vein graft to  the ramus intermedius. / Saphenous vein graft to the circumflex marginal     . Amputation  03/06/2012    Procedure: AMPUTATION RAY;  Surgeon: Harvie Junior, MD;  Location: [facility];  Service: Orthopedics;  Laterality: Left;  First Ray Amputation  . Intramedullary (im) nail intertrochanteric Right 08/17/2013    Procedure: INTRAMEDULLARY (IM) NAIL INTERTROCHANTRIC;  Surgeon: Eldred Manges, MD;  Location: [facility];  Service: Orthopedics;  Laterality: Right;   No family history on file.  Social History:  reports that he quit smoking about 28 years ago. His smoking use included Cigarettes. He has a 20 pack-year smoking history. He has never used smokeless tobacco. He reports that he does not drink alcohol or use illicit drugs.   Allergies  Allergen Reactions  . Morphine And Related Other (See Comments)    Agitated with morphine drip   Medications Prior to Admission   Medication Sig Dispense Refill  . allopurinol (ZYLOPRIM) 300 MG tablet Take 0.5 tablets (150 mg total) by mouth daily.  90 tablet  0  . aspirin EC 81 MG tablet Take 81 mg by mouth every Monday, Wednesday, and Friday.      Marland Kitchen atorvastatin (LIPITOR) 10 MG tablet Take 1 tablet (10 mg total) by mouth daily.  90 tablet  3  . B Complex-Biotin-FA (B COMPLETE PO) Take 0.5 tablets by mouth 2 (two) times daily.       . carvedilol (COREG) 6.25 MG tablet Take 6.25 mg by mouth 2 (two) times daily with a meal.      . Coenzyme Q10 (CO Q-10 PO) Take 1 tablet by mouth every morning.       . DULoxetine (CYMBALTA) 30 MG capsule Take 30 mg by mouth every evening.      Marland Kitchen HYDROmorphone (DILAUDID) 4 MG tablet Take 4 mg by mouth every 4 (four) hours as needed for pain.      Marland Kitchen lisinopril (PRINIVIL,ZESTRIL) 20 MG tablet Take 20 mg by mouth every evening.      . Multiple Vitamin (MULTIVITAMIN) capsule Take 1 capsule by mouth every morning.       . nitroGLYCERIN (NITROSTAT) 0.4 MG SL tablet Place 0.4 mg under the tongue every 5 (five) minutes as needed for chest pain.      . tamsulosin (FLOMAX) 0.4 MG CAPS capsule Take 0.4 mg by mouth at bedtime.      . vitamin C (ASCORBIC ACID) 500 MG tablet Take 500 mg by mouth every morning.        Home: Home Living Family/patient expects to be discharged to:: Private residence Living Arrangements: Spouse/significant other Available Help at Discharge: Family Type of Home: House Home Access: Stairs to enter [employer] of Steps: 1 Entrance Stairs-Rails: None Home Layout: Two level;Able to live on main level with bedroom/bathroom  Functional History:   Functional Status:  Mobility: Bed Mobility Bed Mobility: Supine to Sit;Sit to Supine Supine to Sit: 2: Max assist;HOB elevated Sit to Supine: 1: +2 Total assist;HOB flat Sit to Supine: Patient Percentage: 40% Transfers Transfers: Sit to Stand;Stand to Sit Sit to Stand: 1: +2 Total assist;From elevated  surface Sit to Stand: Patient Percentage: 40% Stand to Sit: 1: +2 Total assist;To elevated surface Stand to Sit: Patient Percentage: 40%      ADL:    Cognition: Cognition Overall Cognitive Status: Within Functional Limits for tasks assessed Orientation Level: Oriented X4 Cognition Arousal/Alertness: Awake/alert Behavior During Therapy: WFL for tasks assessed/performed Overall Cognitive Status: Within Functional Limits for tasks assessed  Blood pressure 119/63, pulse 77, temperature 97.7 F (36.5 C), temperature source Oral, resp. rate 19, height 5\' 10"  (1.778 m), weight 79.9 kg (176 lb 2.4 oz), SpO2 99.00%.  Physical  Exam  Nursing note and vitals reviewed. Constitutional: He is oriented to person, place, and time. He appears well-nourished. No distress.  HENT:  Head: Atraumatic.  Left Ear: External ear normal.  Mouth/Throat: Oropharynx is clear and moist.  Eyes:  Scleral hemorrhage OD  Neck: No JVD present. No tracheal deviation present. No thyromegaly present.  Cardiovascular: Normal rate and regular rhythm.   Pulmonary/Chest: Effort normal.  Abdominal: He exhibits no distension. There is no tenderness. There is no rebound.  Musculoskeletal:  Right hip dressed. Very tender. Mild surrounding edema.   Lymphadenopathy:    He has no cervical adenopathy.  Neurological: He is alert and oriented to person, place, and time. He has normal reflexes.  Strength 5/5 UE's LLE 3/5 prox to 4/5 distally. Some atrophy of right foot. RLE limited due to pain. Seems to have heel cord tightness right lower. strenght 3-4/5 distally  Skin: He is not diaphoretic.  Psychiatric: He has a normal mood and affect. His behavior is normal. Judgment and thought content normal.    Results for orders placed during the hospital encounter of 08/16/13 (from the past 24 hour(s))  CBC     Status: Abnormal   Collection Time    08/20/13  4:45 AM      Result Value Range   WBC 10.4  4.0 - 10.5 K/uL   RBC  2.73 (*) 4.22 - 5.81 MIL/uL   Hemoglobin 8.2 (*) 13.0 - 17.0 g/dL   HCT 09.6 (*) 04.5 - 40.9 %   MCV 88.3  78.0 - 100.0 fL   MCH 30.0  26.0 - 34.0 pg   MCHC 34.0  30.0 - 36.0 g/dL   RDW 81.1 (*) 91.4 - 78.2 %   Platelets 103 (*) 150 - 400 K/uL  BASIC METABOLIC PANEL     Status: Abnormal   Collection Time    08/20/13  4:45 AM      Result Value Range   Sodium 129 (*) 135 - 145 mEq/L   Potassium 4.3  3.5 - 5.1 mEq/L   Chloride 96  96 - 112 mEq/L   CO2 25  19 - 32 mEq/L   Glucose, Bld 119 (*) 70 - 99 mg/dL   BUN 51 (*) 6 - 23 mg/dL   Creatinine, Ser 9.56  0.50 - 1.35 mg/dL   Calcium 8.8  8.4 - 21.3 mg/dL   GFR calc non Af Amer 48 (*) >90 mL/min   GFR calc Af Amer 55 (*) >90 mL/min   No results found.  Assessment/Plan: Diagnosis: right intertrochanteric hip fracture 1. Does the need for close, 24 hr/day medical supervision in concert with the patient's rehab needs make it unreasonable for this patient to be served in a less intensive setting? Yes 2. Co-Morbidities requiring supervision/potential complications: cad, abla, chronic pain, peripheral neuropathy 3. Due to bladder management, bowel management, safety, skin/wound care, disease management, medication administration, pain management and patient education, does the patient require 24 hr/day rehab nursing? Yes 4. Does the patient require coordinated care of a physician, rehab nurse, PT (1-2 hrs/day, 5 days/week) and OT (1-2 hrs/day, 5 days/week) to address physical and functional deficits in the context of the above medical diagnosis(es)? Yes Addressing deficits in the following areas: balance, endurance, locomotion, strength, transferring, bowel/bladder control, bathing, dressing, feeding, grooming and toileting 5. Can the patient actively participate in an intensive therapy program of at least 3 hrs of therapy per day at least 5 days per week? Yes 6. The potential for patient to  make measurable gains while on inpatient rehab is  excellent 7. Anticipated functional outcomes upon discharge from inpatient rehab are mod I to min assist with PT, mod I to min assist with OT, n/a with SLP. 8. Estimated rehab length of stay to reach the above functional goals is: 10-14 days 9. Does the patient have adequate social supports to accommodate these discharge functional goals? Yes 10. Anticipated D/C setting: Home 11. Anticipated post D/C treatments: HH therapy 12. Overall Rehab/Functional Prognosis: excellent  RECOMMENDATIONS: This patient's condition is appropriate for continued rehabilitative care in the following setting: CIR Patient has agreed to participate in recommended program. Potentially Note that insurance prior authorization may be required for reimbursement for recommended care.  Comment: Pt indicated that he wanted to go to SNF near home. Wife wants him to come to [place]. I think there are numerous advantages to coming to [facility] CIR----which I explained.  Ultimately the decision is his. Rehab RN to follow up.   Ranelle Oyster, MD, [place] [facility]     08/20/2013

## 2013-08-20 NOTE — Progress Notes (Signed)
Clinical Social Work  CSW spoke with [employer] representative [name]) who reports patient will admit to CIR on 08/21/13. CSW is signing off but available if further needs arise.  Unk Lightning, LCSW (Coverage for [employer])

## 2013-08-20 NOTE — Progress Notes (Signed)
Physical Therapy Treatment Patient Details Name: Timothy [name] MRN: [id number] DOB: August 07, 1928 Today's Date: 08/20/2013 Time: 6962-9528 PT Time Calculation (min): 23 min  PT Assessment / Plan / Recommendation  History of Present Illness pt reports he still has alot of pain with mobility   PT Comments   Pt cooperative with PT and able to do more, but still is limited by pain and needs assistance moving RLE  Follow Up Recommendations  CIR     Does the patient have the potential to tolerate intense rehabilitation     Barriers to Discharge        Equipment Recommendations  None recommended by PT    Recommendations for Other Services    Frequency Min 3X/week   Progress towards PT Goals Progress towards PT goals: Progressing toward goals  Plan Current plan remains appropriate    Precautions / Restrictions Precautions Precautions: Fall Restrictions Weight Bearing Restrictions: Yes RLE Weight Bearing: Weight bearing as tolerated   Pertinent Vitals/Pain Pain in RLE with mobility    Mobility  Bed Mobility Bed Mobility: Sit to Supine Supine to Sit: 2: Max assist;HOB elevated Sitting - Scoot to Edge of Bed: With rail;1: +1 Total assist Sit to Supine: 1: +2 Total assist;HOB flat Sit to Supine: Patient Percentage: 50% Details for Bed Mobility Assistance: Assist for trunk and bil LEs. Increased time. VCs safety, technique, hand placement.  Transfers Transfers: Sit to Stand;Stand to Sit Sit to Stand: 1: +2 Total assist;With upper extremity assist;From chair/3-in-1 Sit to Stand: Patient Percentage: 60% Stand to Sit: 1: +2 Total assist;To elevated surface;To chair/3-in-1;With upper extremity assist Stand to Sit: Patient Percentage: 70% Details for Transfer Assistance: pt needed assist  by pulling up on the pad he was sitting on to initiate lift off Ambulation/Gait Ambulation/Gait Assistance: 1: +2 Total assist Ambulation/Gait: Patient Percentage: 50% Ambulation Distance (Feet):  5 Feet Assistive device: Rolling walker Ambulation/Gait Assistance Details: frequent verbal cues to keep chest up high and hips under him to stand up straight and improve weight bearing and upright balance.  Gait Pattern: Step-to pattern;Decreased step length - right;Decreased step length - left;Narrow base of support;Trunk flexed Gait velocity: decreased General Gait Details: limited by pain, but pt able to attempt to follow all multimodal cues to correct technique Stairs: No Wheelchair Mobility Wheelchair Mobility: No    Exercises General Exercises - Lower Extremity Ankle Circles/Pumps: AROM;Both;10 reps;Supine Quad Sets: AROM;Both;10 reps;Supine Gluteal Sets: AROM;Both;5 reps;Standing Short Arc Quad: Right;10 reps;AAROM;Supine Long Arc Quad: AAROM;Right;10 reps;Seated   PT Diagnosis:    PT Problem List:   PT Treatment Interventions:     PT Goals (current goals can now be found in the care plan section) Acute Rehab PT Goals Patient Stated Goal: Increase independence  Visit Information  Last PT Received On: 08/20/13 Assistance Needed: +2 History of Present Illness: pt reports he still has alot of pain with mobility    Subjective Data  Patient Stated Goal: Increase independence   Cognition  Cognition Arousal/Alertness: Awake/alert Behavior During Therapy: WFL for tasks assessed/performed Overall Cognitive Status: Within Functional Limits for tasks assessed    Balance  Balance Balance Assessed: Yes Static Sitting Balance Static Sitting - Balance Support: Bilateral upper extremity supported;Feet supported Static Sitting - Level of Assistance: 4: Min assist Static Sitting - Comment/# of Minutes: Posterior lean in sitting and standing noted. Assist to stabilize Static Standing Balance Static Standing - Balance Support: Bilateral upper extremity supported Static Standing - Level of Assistance: 1: +2 Total assist Static Standing -  Comment/# of Minutes: needed multimodal  cueing for pt to find midline and maintain balance  End of Session PT - End of Session Equipment Utilized During Treatment: Gait belt Activity Tolerance: Patient limited by fatigue;Patient limited by pain Patient left: in bed;with call bell/phone within reach;with family/visitor present Nurse Communication: Mobility status   GP    Rosey Bath K. Cl[place], [place] [phone] 08/20/2013, 3:43 PM

## 2013-08-20 NOTE — Progress Notes (Signed)
I contacted pt by phone to discuss his preference for rehab venue. He wishes for me to meet him at 2 pm today to further discuss with pt and his wife. [phone]

## 2013-08-20 NOTE — Progress Notes (Signed)
Subjective: 3 Days Post-Op Procedure(s) (LRB): INTRAMEDULLARY (IM) NAIL INTERTROCHANTRIC (Right) Patient reports pain as 4 on 0-10 scale.    Objective: Vital signs in last 24 hours: Temp:  [97.7 F (36.5 C)-98.7 F (37.1 C)] 97.7 F (36.5 C) (10/01 1610) Pulse Rate:  [61-81] 77 (10/01 0613) Resp:  [12-19] 19 (10/01 0613) BP: (97-122)/(47-64) 119/63 mmHg (10/01 0613) SpO2:  [88 %-100 %] 99 % (10/01 0613)  Intake/Output from previous day: 09/30 0701 - 10/01 0700 In: 1153.5 [P.O.:360; I.V.:160.5; Blood:633] Out: 1125 [Urine:1125] Intake/Output this shift:     Recent Labs  08/18/13 0435 08/19/13 0441 08/20/13 0445  HGB 8.4* 6.7* 8.2*    Recent Labs  08/19/13 0441 08/20/13 0445  WBC 11.7* 10.4  RBC 2.26* 2.73*  HCT 20.3* 24.1*  PLT 128* 103*    Recent Labs  08/19/13 0441 08/20/13 0445  NA 129* 129*  K 4.2 4.3  CL 96 96  CO2 25 25  BUN 36* 51*  CREATININE 1.35 1.34  GLUCOSE 135* 119*  CALCIUM 8.8 8.8   No results found for this basename: LABPT, INR,  in the last 72 hours  Neurologically intact  Assessment/Plan: 3 Days Post-Op Procedure(s) (LRB): INTRAMEDULLARY (IM) NAIL INTERTROCHANTRIC (Right) Up with therapy  Foley removed,   Had I and O cath for 400cc after foley removed per SCIP protocol.     Hgb 8.2  Yessica Putnam C 08/20/2013, 7:36 AM

## 2013-08-21 ENCOUNTER — Inpatient Hospital Stay (HOSPITAL_COMMUNITY)
Admission: RE | Admit: 2013-08-21 | Discharge: 2013-09-05 | DRG: 945 | Disposition: A | Discharge status: Home Health | Payer: Medicare Other | Source: Intra-hospital | Attending: Physical Medicine & Rehabilitation | Admitting: Physical Medicine & Rehabilitation

## 2013-08-21 DIAGNOSIS — S72141A Displaced intertrochanteric fracture of right femur, initial encounter for closed fracture: Secondary | ICD-10-CM

## 2013-08-21 DIAGNOSIS — E871 Hypo-osmolality and hyponatremia: Secondary | ICD-10-CM | POA: Diagnosis present

## 2013-08-21 DIAGNOSIS — N289 Disorder of kidney and ureter, unspecified: Secondary | ICD-10-CM | POA: Diagnosis present

## 2013-08-21 DIAGNOSIS — S72141S Displaced intertrochanteric fracture of right femur, sequela: Secondary | ICD-10-CM

## 2013-08-21 DIAGNOSIS — Z5189 Encounter for other specified aftercare: Principal | ICD-10-CM

## 2013-08-21 DIAGNOSIS — S72143A Displaced intertrochanteric fracture of unspecified femur, initial encounter for closed fracture: Secondary | ICD-10-CM | POA: Diagnosis present

## 2013-08-21 DIAGNOSIS — I252 Old myocardial infarction: Secondary | ICD-10-CM

## 2013-08-21 DIAGNOSIS — I5022 Chronic systolic (congestive) heart failure: Secondary | ICD-10-CM | POA: Diagnosis present

## 2013-08-21 DIAGNOSIS — Z951 Presence of aortocoronary bypass graft: Secondary | ICD-10-CM

## 2013-08-21 DIAGNOSIS — M109 Gout, unspecified: Secondary | ICD-10-CM | POA: Diagnosis present

## 2013-08-21 DIAGNOSIS — W19XXXA Unspecified fall, initial encounter: Secondary | ICD-10-CM | POA: Diagnosis present

## 2013-08-21 DIAGNOSIS — S98139A Complete traumatic amputation of one unspecified lesser toe, initial encounter: Secondary | ICD-10-CM

## 2013-08-21 DIAGNOSIS — C9000 Multiple myeloma not having achieved remission: Secondary | ICD-10-CM | POA: Diagnosis present

## 2013-08-21 DIAGNOSIS — G8929 Other chronic pain: Secondary | ICD-10-CM | POA: Diagnosis present

## 2013-08-21 DIAGNOSIS — I10 Essential (primary) hypertension: Secondary | ICD-10-CM | POA: Diagnosis present

## 2013-08-21 DIAGNOSIS — I2510 Atherosclerotic heart disease of native coronary artery without angina pectoris: Secondary | ICD-10-CM | POA: Diagnosis present

## 2013-08-21 DIAGNOSIS — S72009D Fracture of unspecified part of neck of unspecified femur, subsequent encounter for closed fracture with routine healing: Secondary | ICD-10-CM | POA: Diagnosis present

## 2013-08-21 DIAGNOSIS — E785 Hyperlipidemia, unspecified: Secondary | ICD-10-CM | POA: Diagnosis present

## 2013-08-21 DIAGNOSIS — I509 Heart failure, unspecified: Secondary | ICD-10-CM | POA: Diagnosis present

## 2013-08-21 DIAGNOSIS — Z87891 Personal history of nicotine dependence: Secondary | ICD-10-CM

## 2013-08-21 DIAGNOSIS — D62 Acute posthemorrhagic anemia: Secondary | ICD-10-CM | POA: Diagnosis present

## 2013-08-21 DIAGNOSIS — N400 Enlarged prostate without lower urinary tract symptoms: Secondary | ICD-10-CM | POA: Diagnosis present

## 2013-08-21 DIAGNOSIS — I2589 Other forms of chronic ischemic heart disease: Secondary | ICD-10-CM | POA: Diagnosis present

## 2013-08-21 DIAGNOSIS — K5900 Constipation, unspecified: Secondary | ICD-10-CM | POA: Diagnosis not present

## 2013-08-21 LAB — BASIC METABOLIC PANEL
BUN: 44 mg/dL — ABNORMAL HIGH (ref 6–23)
Chloride: 96 mEq/L (ref 96–112)
Creatinine, Ser: 1.15 mg/dL (ref 0.50–1.35)
GFR calc Af Amer: 66 mL/min — ABNORMAL LOW (ref >90)
GFR calc non Af Amer: 57 mL/min — ABNORMAL LOW (ref >90)
Glucose, Bld: 127 mg/dL — ABNORMAL HIGH (ref 70–99)

## 2013-08-21 LAB — URINALYSIS, ROUTINE W REFLEX MICROSCOPIC
Bilirubin Urine: NEGATIVE
Glucose, UA: NEGATIVE mg/dL
Ketones, ur: NEGATIVE mg/dL
Protein, ur: NEGATIVE mg/dL
pH: 6.5 (ref 5.0–8.0)

## 2013-08-21 LAB — CBC
HCT: 22.2 % — ABNORMAL LOW (ref 39.0–52.0)
Hemoglobin: 7.5 g/dL — ABNORMAL LOW (ref 13.0–17.0)
MCHC: 33.8 g/dL (ref 30.0–36.0)
MCV: 88.1 fL (ref 78.0–100.0)
RDW: 15.8 % — ABNORMAL HIGH (ref 11.5–15.5)
WBC: 6.5 10*3/uL (ref 4.0–10.5)

## 2013-08-21 LAB — MRSA PCR SCREENING: MRSA by PCR: NEGATIVE

## 2013-08-21 MED ORDER — CARVEDILOL 6.25 MG PO TABS
6.2500 mg | ORAL_TABLET | Freq: Two times a day (BID) | ORAL | Status: DC
  Administered 2013-08-21 – 2013-09-04 (×29): 6.25 mg via ORAL
  Filled 2013-08-21 (×32): qty 1

## 2013-08-21 MED ORDER — NALOXONE HCL 0.4 MG/ML IJ SOLN: 0.4000 mg | INTRAMUSCULAR | Status: DC | PRN

## 2013-08-21 MED ORDER — LISINOPRIL 20 MG PO TABS
20.0000 mg | ORAL_TABLET | Freq: Every evening | ORAL | Status: DC
  Administered 2013-08-21 – 2013-09-04 (×15): 20 mg via ORAL
  Filled 2013-08-21 (×16): qty 1

## 2013-08-21 MED ORDER — HYDROMORPHONE HCL 2 MG PO TABS
4.0000 mg | ORAL_TABLET | ORAL | Status: DC | PRN
  Administered 2013-08-21: 6 mg via ORAL
  Filled 2013-08-21: qty 3

## 2013-08-21 MED ORDER — ALLOPURINOL 150 MG HALF TABLET
150.0000 mg | ORAL_TABLET | Freq: Every day | ORAL | Status: DC
  Administered 2013-08-22 – 2013-09-05 (×15): 150 mg via ORAL
  Filled 2013-08-21 (×16): qty 1

## 2013-08-21 MED ORDER — TRAZODONE HCL 50 MG PO TABS
25.0000 mg | ORAL_TABLET | Freq: Every evening | ORAL | Status: DC | PRN
  Administered 2013-08-25: 50 mg via ORAL
  Filled 2013-08-21: qty 1

## 2013-08-21 MED ORDER — ATORVASTATIN CALCIUM 10 MG PO TABS
10.0000 mg | ORAL_TABLET | Freq: Every day | ORAL | Status: DC
  Administered 2013-08-21 – 2013-08-25 (×5): 10 mg via ORAL
  Filled 2013-08-21 (×6): qty 1

## 2013-08-21 MED ORDER — HYDROMORPHONE HCL 2 MG PO TABS: 2.0000 mg | ORAL_TABLET | ORAL | Status: DC | PRN

## 2013-08-21 MED ORDER — ENOXAPARIN SODIUM 30 MG/0.3ML ~~LOC~~ SOLN
30.0000 mg | SUBCUTANEOUS | Status: DC
  Administered 2013-08-21 – 2013-08-22 (×2): 30 mg via SUBCUTANEOUS
  Filled 2013-08-21 (×3): qty 0.3

## 2013-08-21 MED ORDER — BISACODYL 10 MG RE SUPP
10.0000 mg | Freq: Every day | RECTAL | Status: DC | PRN
  Administered 2013-08-25: 10 mg via RECTAL
  Filled 2013-08-21: qty 1

## 2013-08-21 MED ORDER — ACETAMINOPHEN 325 MG PO TABS: 325.0000 mg | ORAL_TABLET | ORAL | Status: DC | PRN

## 2013-08-21 MED ORDER — METHOCARBAMOL 500 MG PO TABS
500.0000 mg | ORAL_TABLET | Freq: Four times a day (QID) | ORAL | Status: DC | PRN
  Administered 2013-08-22 – 2013-08-29 (×14): 500 mg via ORAL
  Filled 2013-08-21 (×14): qty 1

## 2013-08-21 MED ORDER — PROCHLORPERAZINE MALEATE 5 MG PO TABS
5.0000 mg | ORAL_TABLET | Freq: Four times a day (QID) | ORAL | Status: DC | PRN
  Filled 2013-08-21: qty 2

## 2013-08-21 MED ORDER — PHENOL 1.4 % MT LIQD: 1.0000 | OROMUCOSAL | Status: DC | PRN

## 2013-08-21 MED ORDER — CALCIUM CARBONATE ANTACID 500 MG PO CHEW: 1.0000 | CHEWABLE_TABLET | Freq: Two times a day (BID) | ORAL | Status: DC

## 2013-08-21 MED ORDER — TAMSULOSIN HCL 0.4 MG PO CAPS
0.4000 mg | ORAL_CAPSULE | Freq: Every day | ORAL | Status: DC
  Administered 2013-08-21 – 2013-08-25 (×5): 0.4 mg via ORAL
  Filled 2013-08-21 (×6): qty 1

## 2013-08-21 MED ORDER — ONDANSETRON HCL 4 MG/2ML IJ SOLN: 4.0000 mg | Freq: Four times a day (QID) | INTRAMUSCULAR | Status: DC | PRN

## 2013-08-21 MED ORDER — FLEET ENEMA 7-19 GM/118ML RE ENEM
1.0000 | ENEMA | Freq: Once | RECTAL | Status: AC
  Administered 2013-08-21: 1 via RECTAL
  Filled 2013-08-21: qty 1

## 2013-08-21 MED ORDER — GUAIFENESIN-DM 100-10 MG/5ML PO SYRP: 5.0000 mL | ORAL_SOLUTION | Freq: Four times a day (QID) | ORAL | Status: DC | PRN

## 2013-08-21 MED ORDER — FLEET ENEMA 7-19 GM/118ML RE ENEM: 1.0000 | ENEMA | Freq: Once | RECTAL | Status: AC | PRN

## 2013-08-21 MED ORDER — PROCHLORPERAZINE EDISYLATE 5 MG/ML IJ SOLN
5.0000 mg | Freq: Four times a day (QID) | INTRAMUSCULAR | Status: DC | PRN
  Filled 2013-08-21: qty 2

## 2013-08-21 MED ORDER — PROCHLORPERAZINE 25 MG RE SUPP
12.5000 mg | Freq: Four times a day (QID) | RECTAL | Status: DC | PRN
  Filled 2013-08-21: qty 1

## 2013-08-21 MED ORDER — DULOXETINE HCL 30 MG PO CPEP
30.0000 mg | ORAL_CAPSULE | Freq: Every evening | ORAL | Status: DC
  Administered 2013-08-21 – 2013-09-04 (×15): 30 mg via ORAL
  Filled 2013-08-21 (×16): qty 1

## 2013-08-21 MED ORDER — CALCIUM CARBONATE ANTACID 500 MG PO CHEW
1.0000 | CHEWABLE_TABLET | Freq: Two times a day (BID) | ORAL | Status: DC
  Administered 2013-08-21 – 2013-09-05 (×15): 200 mg via ORAL
  Filled 2013-08-21 (×32): qty 1

## 2013-08-21 MED ORDER — ASPIRIN EC 325 MG PO TBEC: 325.0000 mg | DELAYED_RELEASE_TABLET | Freq: Two times a day (BID) | ORAL | Status: DC

## 2013-08-21 MED ORDER — TRAMADOL HCL 50 MG PO TABS
50.0000 mg | ORAL_TABLET | Freq: Four times a day (QID) | ORAL | Status: DC | PRN
  Administered 2013-08-21 – 2013-08-24 (×3): 50 mg via ORAL
  Filled 2013-08-21 (×3): qty 1

## 2013-08-21 MED ORDER — VITAMIN C 500 MG PO TABS
500.0000 mg | ORAL_TABLET | Freq: Every morning | ORAL | Status: DC
  Administered 2013-08-22 – 2013-09-02 (×12): 500 mg via ORAL
  Filled 2013-08-21 (×15): qty 1

## 2013-08-21 MED ORDER — MENTHOL 3 MG MT LOZG: 1.0000 | LOZENGE | OROMUCOSAL | Status: DC | PRN

## 2013-08-21 MED ORDER — ONDANSETRON HCL 4 MG PO TABS: 4.0000 mg | ORAL_TABLET | Freq: Four times a day (QID) | ORAL | Status: DC | PRN

## 2013-08-21 MED ORDER — POLYETHYLENE GLYCOL 3350 17 G PO PACK
17.0000 g | PACK | Freq: Every day | ORAL | Status: DC
  Administered 2013-08-23: 17 g via ORAL
  Filled 2013-08-21 (×3): qty 1

## 2013-08-21 MED ORDER — ALUM & MAG HYDROXIDE-SIMETH 200-200-20 MG/5ML PO SUSP: 30.0000 mL | ORAL | Status: DC | PRN

## 2013-08-21 NOTE — H&P (Signed)
Physical Medicine and Rehabilitation Admission H&P    Chief Complaint  Patient presents with  . Right hip fracture, anemia  : HPI: Timothy [name] is a 79 y.o. male with history of CAD, peripheral neuropathy, MM, lumbago; who fell while getting off his lawnmower on 08/16/13, landed on cement with resultant right hip pain and inability to walk. Patient reported shorts getting caught on handle bar. Xrays with right IT hip fracture with lesser trochanter as separate piece. Patient taken to OR on 08/18/13 for right trochanteric nail with pin lag screw by Dr. Yates. Noted to have mild hematoma right hip. Post op with ABLA and transfused with 1 unit PRBC as hgb 6.7. Has had poor UOP as well as worsening of chronic hyponatremia. PT evaluation done yesterday with limitation in mobility as well    ROS positive for R hip pain, Left sided low back pain Past Medical History  Diagnosis Date  . ASCVD (arteriosclerotic cardiovascular disease)   . Anemia     chronic mild anemia  . Gout   . Hypertension   . Lumbar disc disease     post laminectomy  . Chronic back pain   . History of echocardiogram 11/02/2010    EF range of 30 to 35% / There is hypokinsesis of the basal-mild inferolateral myocardium  . CHF (congestive heart failure)   . Heart murmur   . Ischemic cardiomyopathy   . Renal insufficiency   . Peripheral neuropathy   . Inferior myocardial infarction 1986  . Hyperlipidemia   . SOB (shortness of breath)   . Fatigue   . Peripheral neuropathy   . Coronary artery disease     CABG 2002 by Dr. Owen with LIMA to LAD, SVG to intermediate, SVG to LCX, & SVG to PL. Last nuclear in 2012 showing large inferior scar with EF of 39%.   . Blood transfusion     DEC 2012 - TWO UNITS  . 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  PT HAS BEEN OFF CHEMO SINCE HIS HOSPITALIZATION FEB 2013 FOR LEFT FOOT INFECTION  . Cancer   . History of shingles MARCH  2009    SHINGLE LESIONS WERE AROUND RIGHT EYE--PT HAS RESIDUAL ITCHING AROUND THE EYE.  . Osteomyelitis     s/p toe amputation  . Chronic systolic heart failure     EF of 30% per echo 02/2012  . Protein malnutrition    Past Surgical History  Procedure Laterality Date  . Laminectomy    . Inguinal hernia repair    . Cardiac catheterization  06/20/2000    Severe coronary disease (totally occluded right artery, 90% left circumflex, 50-60% intermediate, and 90-95% ostial left anterior descending)   . Tonsillectomy    . Coronary artery bypass graft  2002    x4 / Left internal mammary artery to the LAD.  / Saphenous vein graft to the right posterolateral.  /  Saphenous vein graft to  the ramus intermedius. / Saphenous vein graft to the circumflex marginal     . Amputation  03/06/2012    Procedure: AMPUTATION RAY;  Surgeon: John L Graves, MD;  Location: [facility];  Service: Orthopedics;  Laterality: Left;  First Ray Amputation  . Intramedullary (im) nail intertrochanteric Right 08/17/2013    Procedure: INTRAMEDULLARY (IM) NAIL INTERTROCHANTRIC;  Surgeon: Mark C Yates, MD;  Location: [facility];  Service: Orthopedics;  Laterality: Right;   No family history on file. Social History:    reports that he quit smoking about 28 years ago. His smoking use included Cigarettes. He has a 20 pack-year smoking history. He has never used smokeless tobacco. He reports that he does not drink alcohol or use illicit drugs. Allergies:  Allergies  Allergen Reactions  . Morphine And Related Other (See Comments)    Agitated with morphine drip   Medications Prior to Admission  Medication Sig Dispense Refill  . allopurinol (ZYLOPRIM) 300 MG tablet Take 0.5 tablets (150 mg total) by mouth daily.  90 tablet  0  . aspirin EC 81 MG tablet Take 81 mg by mouth every Monday, Wednesday, and Friday.      . atorvastatin (LIPITOR) 10 MG tablet Take 1 tablet (10 mg total) by mouth daily.  90 tablet  3  . B Complex-Biotin-FA (B COMPLETE  PO) Take 0.5 tablets by mouth 2 (two) times daily.       . carvedilol (COREG) 6.25 MG tablet Take 6.25 mg by mouth 2 (two) times daily with a meal.      . Coenzyme Q10 (CO Q-10 PO) Take 1 tablet by mouth every morning.       . DULoxetine (CYMBALTA) 30 MG capsule Take 30 mg by mouth every evening.      . HYDROmorphone (DILAUDID) 4 MG tablet Take 4 mg by mouth every 4 (four) hours as needed for pain.      . lisinopril (PRINIVIL,ZESTRIL) 20 MG tablet Take 20 mg by mouth every evening.      . Multiple Vitamin (MULTIVITAMIN) capsule Take 1 capsule by mouth every morning.       . nitroGLYCERIN (NITROSTAT) 0.4 MG SL tablet Place 0.4 mg under the tongue every 5 (five) minutes as needed for chest pain.      . tamsulosin (FLOMAX) 0.4 MG CAPS capsule Take 0.4 mg by mouth at bedtime.      . vitamin C (ASCORBIC ACID) 500 MG tablet Take 500 mg by mouth every morning.        Home: Home Living Family/patient expects to be discharged to:: Private residence Living Arrangements: Spouse/significant other Available Help at Discharge: Family;Available 24 hours/day Type of Home: House Home Access: Stairs to enter Entrance Stairs-Number of Steps: 1 Entrance Stairs-Rails: None Home Layout: Two level;Able to live on main level with bedroom/bathroom Home Equipment: Walker - 2 wheels;Bedside commode;Wheelchair - manual Additional Comments: bathroom completley handicapped accessible with bars, tub/shower bench, etc  Lives With: Spouse   Functional History: Prior Function Comments: wrist splint to left wrist due to surgical procedure July  Functional Status:  Mobility: Bed Mobility Bed Mobility: Sit to Supine Supine to Sit: 2: Max assist;HOB elevated Sitting - Scoot to Edge of Bed: With rail;1: +1 Total assist Sit to Supine: 1: +2 Total assist;HOB flat Sit to Supine: Patient Percentage: 50% Transfers Transfers: Sit to Stand;Stand to Sit Sit to Stand: 1: +2 Total assist;With upper extremity assist;From  chair/3-in-1 Sit to Stand: Patient Percentage: 60% Stand to Sit: 1: +2 Total assist;To elevated surface;To chair/3-in-1;With upper extremity assist Stand to Sit: Patient Percentage: 70% Ambulation/Gait Ambulation/Gait Assistance: 1: +2 Total assist Ambulation/Gait: Patient Percentage: 50% Ambulation Distance (Feet): 5 Feet Assistive device: Rolling walker Ambulation/Gait Assistance Details: frequent verbal cues to keep chest up high and hips under him to stand up straight and improve weight bearing and upright balance.  Gait Pattern: Step-to pattern;Decreased step length - right;Decreased step length - left;Narrow base of support;Trunk flexed Gait velocity: decreased General Gait Details: limited by pain, but pt able   to attempt to follow all multimodal cues to correct technique Stairs: No Wheelchair Mobility Wheelchair Mobility: No  ADL: ADL Eating/Feeding: Simulated;Set up Where Assessed - Eating/Feeding: Bed level Grooming: Simulated;Set up (Pt w/ posterior lean in sitting) Where Assessed - Grooming: Supported sitting Upper Body Bathing: Simulated;Moderate assistance (Posterior lean in sitting) Where Assessed - Upper Body Bathing: Supported sitting;Unsupported sitting Lower Body Bathing: Simulated;+2 Total assistance Where Assessed - Lower Body Bathing: Supported sit to stand Upper Body Dressing: Simulated;Moderate assistance Where Assessed - Upper Body Dressing: Supported sitting Lower Body Dressing: Performed;+2 Total assistance Where Assessed - Lower Body Dressing: Supported sit to stand Toilet Transfer: Simulated;+2 Total assistance (SPT from EOB to chair) Toilet Transfer Method: Sit to stand;Stand pivot Toilet Transfer Equipment: Bedside commode Tub/Shower Transfer Method: Not assessed Equipment Used: Gait belt;Rolling walker;Upper extremity splints (L wrist prefab wrist cock up splint.) Transfers/Ambulation Related to ADLs: Pt is currently +2 total assist for sit to stand  and SPT from EOB to chair, pt 40%. Pt with significant posterior lean in standing & narrow base of stance. Needs assist to advance R LE. ADL Comments: Pt was educated in Role of OT and participated in SPT in preparation for increased participation in functional mobility related to ADL's. Pt is +2 total assist, 40%. Static sitting @ EOB x5min w/ Min assist, static standing balance also w/ +2 total assist  secondary to posterior lean.  Cognition: Cognition Overall Cognitive Status: Within Functional Limits for tasks assessed Orientation Level: Oriented X4 Cognition Arousal/Alertness: Awake/alert Behavior During Therapy: WFL for tasks assessed/performed Overall Cognitive Status: Within Functional Limits for tasks assessed  Physical Exam: Blood pressure 125/58, pulse 78, temperature 97.9 F (36.6 C), temperature source Oral, resp. rate 18, height 5' 10" (1.778 m), weight 79.9 kg (176 lb 2.4 oz), SpO2 98.00%.  General: No acute distress Mood and affect are appropriate Heart: Regular rate and rhythm no rubs murmurs or extra sounds Lungs: Clear to auscultation, breathing unlabored, no rales or wheezes Abdomen: Positive bowel sounds, soft nontender to palpation, distended Extremities: No clubbing, cyanosis, or edema,  Left distal leg wound draining serosang, R prox hi[ wound draining serosang no erythema Skin: No evidence of breakdown, no evidence of rash Neurologic: Cranial nerves II through XII intact, motor strength is 5/5 in bilateral deltoid, bicep, tricep, grip, ,5/5 Lefthip flexor KE and  1/5 RHF,  knee extensors, 4/5 Bilateral ankle dorsiflexor and plantar flexor Sensory exam normal sensation to light touch and proprioception in bilateral upper and lower extremities Cerebellar exam normal finger to nose to finger as well as heel to shin in bilateral upper and lower extremities Musculoskeletal: Decreased range of motion RLE. Mild L wrist swelling  Results for orders placed during the  hospital encounter of 08/16/13 (from the past 48 hour(s))  CBC     Status: Abnormal   Collection Time    08/20/13  4:45 AM      Result Value Range   WBC 10.4  4.0 - 10.5 K/uL   RBC 2.73 (*) 4.22 - 5.81 MIL/uL   Hemoglobin 8.2 (*) 13.0 - 17.0 g/dL   Comment: POST TRANSFUSION SPECIMEN     DELTA CHECK NOTED   HCT 24.1 (*) 39.0 - 52.0 %   MCV 88.3  78.0 - 100.0 fL   MCH 30.0  26.0 - 34.0 pg   MCHC 34.0  30.0 - 36.0 g/dL   RDW 16.3 (*) 11.5 - 15.5 %   Platelets 103 (*) 150 - 400 K/uL   Comment: PLATELET   COUNT CONFIRMED BY SMEAR  BASIC METABOLIC PANEL     Status: Abnormal   Collection Time    08/20/13  4:45 AM      Result Value Range   Sodium 129 (*) 135 - 145 mEq/L   Potassium 4.3  3.5 - 5.1 mEq/L   Chloride 96  96 - 112 mEq/L   CO2 25  19 - 32 mEq/L   Glucose, Bld 119 (*) 70 - 99 mg/dL   BUN 51 (*) 6 - 23 mg/dL   Creatinine, Ser 1.34  0.50 - 1.35 mg/dL   Calcium 8.8  8.4 - 10.5 mg/dL   GFR calc non Af Amer 48 (*) >90 mL/min   GFR calc Af Amer 55 (*) >90 mL/min   Comment: (NOTE)     The eGFR has been calculated using the CKD EPI equation.     This calculation has not been validated in all clinical situations.     eGFR's persistently <90 mL/min signify possible Chronic Kidney     Disease.  CBC     Status: Abnormal   Collection Time    08/21/13  5:21 AM      Result Value Range   WBC 6.5  4.0 - 10.5 K/uL   RBC 2.52 (*) 4.22 - 5.81 MIL/uL   Hemoglobin 7.5 (*) 13.0 - 17.0 g/dL   HCT 22.2 (*) 39.0 - 52.0 %   MCV 88.1  78.0 - 100.0 fL   MCH 29.8  26.0 - 34.0 pg   MCHC 33.8  30.0 - 36.0 g/dL   RDW 15.8 (*) 11.5 - 15.5 %   Platelets 141 (*) 150 - 400 K/uL   Comment: DELTA CHECK NOTED     REPEATED TO VERIFY  BASIC METABOLIC PANEL     Status: Abnormal   Collection Time    08/21/13  5:21 AM      Result Value Range   Sodium 130 (*) 135 - 145 mEq/L   Potassium 4.0  3.5 - 5.1 mEq/L   Chloride 96  96 - 112 mEq/L   CO2 26  19 - 32 mEq/L   Glucose, Bld 127 (*) 70 - 99 mg/dL   BUN  44 (*) 6 - 23 mg/dL   Creatinine, Ser 1.15  0.50 - 1.35 mg/dL   Calcium 8.6  8.4 - 10.5 mg/dL   GFR calc non Af Amer 57 (*) >90 mL/min   GFR calc Af Amer 66 (*) >90 mL/min   Comment: (NOTE)     The eGFR has been calculated using the CKD EPI equation.     This calculation has not been validated in all clinical situations.     eGFR's persistently <90 mL/min signify possible Chronic Kidney     Disease.   No results found.  Post Admission Physician Evaluation: 1. Functional deficits secondary  to R IT hip fx due to fall. 2. Patient is admitted to receive collaborative, interdisciplinary care between the physiatrist, rehab nursing staff, and therapy team. 3. Patient's level of medical complexity and substantial therapy needs in context of that medical necessity cannot be provided at a lesser intensity of care such as a SNF. 4. Patient has experienced substantial functional loss from his/her baseline which was documented above under the "Functional History" and "Functional Status" headings.  Judging by the patient's diagnosis, physical exam, and functional history, the patient has potential for functional progress which will result in measurable gains while on inpatient rehab.  These gains will be of substantial and   practical use upon discharge  in facilitating mobility and self-care at the household level. 5. Physiatrist will provide 24 hour management of medical needs as well as oversight of the therapy plan/treatment and provide guidance as appropriate regarding the interaction of the two. 6. 24 hour rehab nursing will assist with bladder management, bowel management, safety, skin/wound care, disease management, medication administration, pain management and patient education  and help integrate therapy concepts, techniques,education, etc. 7. PT will assess and treat for/with: pre gait, gait training, endurance , safety, equipment, .   Goals are: min A. 8. OT will assess and treat for/with: ADLs,  safety, endurance, equipment.   Goals are: Min A. 9. SLP will assess and treat for/with: NA.  Goals are: NA. 10. Case Management and Social Worker will assess and treat for psychological issues and discharge planning. 11. Team conference will be held weekly to assess progress toward goals and to determine barriers to discharge. 12. Patient will receive at least 3 hours of therapy per day at least 5 days per week. 13. ELOS: 2 wks       14. Prognosis:  fair   Medical Problem List and Plan: 1. DVT Prophylaxis/Anticoagulation: Mechanical: Sequential compression devices, below knee Bilateral lower extremities 2. Pain Management: Dilaudid po on chronically but now on higher dose 3. Mood: Monitor mood, pt is discouraged 4. Neuropsych: This patient is capable of making decisions on his own behalf. 5.  Multiple myeloma, f/u Dr Magrinat as outpt. 6.  ABLA, S/P transfusion, monitor Hgb 7. HTN: Zestril, coreg 8.  BPH  Flomax , voiding, Check PVR    Andrew E. Kirsteins M.D. [facility] FAAPM&R (Sports Med, Neuromuscular Med) Diplomate Am Board of Electrodiagnostic Med Diplomate Am Board of Pain Medicine Fellow Am Board of Interventional Pain Physicians  08/21/2013 

## 2013-08-21 NOTE — Progress Notes (Signed)
Inpt rehab bed is available today. Discussed with RN, Hgb 7.5. Await MD clearance to admit possibly later today. I will follow up this morning. [phone]

## 2013-08-21 NOTE — Progress Notes (Signed)
Patient have 2 stools after enema solution injection. One small hard stool and  one large soft stool. Patient say he feels relieve

## 2013-08-21 NOTE — Progress Notes (Signed)
Orthopedic Tech Progress Note Patient Details:  Timothy [name] 07/04/1928 [id number] Applied overhead frame to bed.     Jennye Moccasin 08/21/2013, 6:25 PM

## 2013-08-21 NOTE — H&P (Cosign Needed)
Physical Medicine and Rehabilitation Admission H&P    Chief Complaint  Patient presents with  . Right hip fracture, anemia  : HPI: Timothy [name] is a 79 y.o. male with history of CAD, peripheral neuropathy, MM, lumbago; who fell while getting off his lawnmower on 08/16/13, landed on cement with resultant right hip pain and inability to walk. Patient reported shorts getting caught on handle bar. Xrays with right IT hip fracture with lesser trochanter as separate piece. Patient taken to OR on 08/18/13 for right trochanteric nail with pin lag screw by Dr. Ophelia Charter. Noted to have mild hematoma right hip. Post op with ABLA and transfused with 1 unit PRBC as hgb 6.7. Has had poor UOP as well as worsening of chronic hyponatremia. PT evaluation done yesterday with limitation in mobility as well    ROS positive for R hip pain, Left sided low back pain Past Medical History  Diagnosis Date  . ASCVD (arteriosclerotic cardiovascular disease)   . Anemia     chronic mild anemia  . Gout   . Hypertension   . Lumbar disc disease     post laminectomy  . Chronic back pain   . History of echocardiogram 11/02/2010    EF range of 30 to 35% / There is hypokinsesis of the basal-mild inferolateral myocardium  . CHF (congestive heart failure)   . Heart murmur   . Ischemic cardiomyopathy   . Renal insufficiency   . Peripheral neuropathy   . Inferior myocardial infarction 1986  . Hyperlipidemia   . SOB (shortness of breath)   . Fatigue   . Peripheral neuropathy   . Coronary artery disease     CABG 2002 by [name] with LIMA to LAD, SVG to intermediate, SVG to LCX, & SVG to PL. Last nuclear in 2012 showing large inferior scar with EF of 39%.   . Blood transfusion     DEC 2012 - TWO UNITS  . 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  PT HAS BEEN OFF CHEMO SINCE HIS HOSPITALIZATION FEB 2013 FOR LEFT FOOT INFECTION  . Cancer   . History of shingles MARCH  2009    SHINGLE LESIONS WERE AROUND RIGHT EYE--PT HAS RESIDUAL ITCHING AROUND THE EYE.  . Osteomyelitis     s/p toe amputation  . Chronic systolic heart failure     EF of 30% per echo 02/2012  . Protein malnutrition    Past Surgical History  Procedure Laterality Date  . Laminectomy    . Inguinal hernia repair    . Cardiac catheterization  06/20/2000    Severe coronary disease (totally occluded right artery, 90% left circumflex, 50-60% intermediate, and 90-95% ostial left anterior descending)   . Tonsillectomy    . Coronary artery bypass graft  2002    x4 / Left internal mammary artery to the LAD.  / Saphenous vein graft to the right posterolateral.  /  Saphenous vein graft to  the ramus intermedius. / Saphenous vein graft to the circumflex marginal     . Amputation  03/06/2012    Procedure: AMPUTATION RAY;  Surgeon: Harvie Junior, MD;  Location: [facility];  Service: Orthopedics;  Laterality: Left;  First Ray Amputation  . Intramedullary (im) nail intertrochanteric Right 08/17/2013    Procedure: INTRAMEDULLARY (IM) NAIL INTERTROCHANTRIC;  Surgeon: Eldred Manges, MD;  Location: [facility];  Service: Orthopedics;  Laterality: Right;   No family history on file. Social History:  reports that he quit smoking about 28 years ago. His smoking use included Cigarettes. He has a 20 pack-year smoking history. He has never used smokeless tobacco. He reports that he does not drink alcohol or use illicit drugs. Allergies:  Allergies  Allergen Reactions  . Morphine And Related Other (See Comments)    Agitated with morphine drip   Medications Prior to Admission  Medication Sig Dispense Refill  . allopurinol (ZYLOPRIM) 300 MG tablet Take 0.5 tablets (150 mg total) by mouth daily.  90 tablet  0  . aspirin EC 81 MG tablet Take 81 mg by mouth every Monday, Wednesday, and Friday.      Marland Kitchen atorvastatin (LIPITOR) 10 MG tablet Take 1 tablet (10 mg total) by mouth daily.  90 tablet  3  . B Complex-Biotin-FA (B COMPLETE  PO) Take 0.5 tablets by mouth 2 (two) times daily.       . carvedilol (COREG) 6.25 MG tablet Take 6.25 mg by mouth 2 (two) times daily with a meal.      . Coenzyme Q10 (CO Q-10 PO) Take 1 tablet by mouth every morning.       . DULoxetine (CYMBALTA) 30 MG capsule Take 30 mg by mouth every evening.      Marland Kitchen HYDROmorphone (DILAUDID) 4 MG tablet Take 4 mg by mouth every 4 (four) hours as needed for pain.      Marland Kitchen lisinopril (PRINIVIL,ZESTRIL) 20 MG tablet Take 20 mg by mouth every evening.      . Multiple Vitamin (MULTIVITAMIN) capsule Take 1 capsule by mouth every morning.       . nitroGLYCERIN (NITROSTAT) 0.4 MG SL tablet Place 0.4 mg under the tongue every 5 (five) minutes as needed for chest pain.      . tamsulosin (FLOMAX) 0.4 MG CAPS capsule Take 0.4 mg by mouth at bedtime.      . vitamin C (ASCORBIC ACID) 500 MG tablet Take 500 mg by mouth every morning.        Home: Home Living Family/patient expects to be discharged to:: Private residence Living Arrangements: Spouse/significant other Available Help at Discharge: Family;Available 24 hours/day Type of Home: House Home Access: Stairs to enter [employer] of Steps: 1 Entrance Stairs-Rails: None Home Layout: Two level;Able to live on main level with bedroom/bathroom Home Equipment: Dan Humphreys - 2 wheels;Bedside commode;Wheelchair - manual Additional Comments: bathroom completley handicapped accessible with bars, tub/shower bench, etc  Lives With: Spouse   Functional History: Prior Function Comments: wrist splint to left wrist due to surgical procedure July  Functional Status:  Mobility: Bed Mobility Bed Mobility: Sit to Supine Supine to Sit: 2: Max assist;HOB elevated Sitting - Scoot to Edge of Bed: With rail;1: +1 Total assist Sit to Supine: 1: +2 Total assist;HOB flat Sit to Supine: Patient Percentage: 50% Transfers Transfers: Sit to Stand;Stand to Sit Sit to Stand: 1: +2 Total assist;With upper extremity assist;From  chair/3-in-1 Sit to Stand: Patient Percentage: 60% Stand to Sit: 1: +2 Total assist;To elevated surface;To chair/3-in-1;With upper extremity assist Stand to Sit: Patient Percentage: 70% Ambulation/Gait Ambulation/Gait Assistance: 1: +2 Total assist Ambulation/Gait: Patient Percentage: 50% Ambulation Distance (Feet): 5 Feet Assistive device: Rolling walker Ambulation/Gait Assistance Details: frequent verbal cues to keep chest up high and hips under him to stand up straight and improve weight bearing and upright balance.  Gait Pattern: Step-to pattern;Decreased step length - right;Decreased step length - left;Narrow base of support;Trunk flexed Gait velocity: decreased General Gait Details: limited by pain, but pt able  to attempt to follow all multimodal cues to correct technique Stairs: No Wheelchair Mobility Wheelchair Mobility: No  ADL: ADL Eating/Feeding: Simulated;Set up Where Assessed - Eating/Feeding: Bed level Grooming: Simulated;Set up (Pt w/ posterior lean in sitting) Where Assessed - Grooming: Supported sitting Upper Body Bathing: Simulated;Moderate assistance (Posterior lean in sitting) Where Assessed - Upper Body Bathing: Supported sitting;Unsupported sitting Lower Body Bathing: Simulated;+2 Total assistance Where Assessed - Lower Body Bathing: Supported sit to stand Upper Body Dressing: Simulated;Moderate assistance Where Assessed - Upper Body Dressing: Supported sitting Lower Body Dressing: Performed;+2 Total assistance Where Assessed - Lower Body Dressing: Supported sit to Pharmacist, hospital: Simulated;+2 Total assistance (SPT from EOB to chair) Toilet Transfer Method: Sit to stand;Stand pivot Acupuncturist: Bedside commode Tub/Shower Transfer Method: Not assessed Equipment Used: Gait belt;Rolling walker;Upper extremity splints (L wrist prefab wrist cock up splint.) Transfers/Ambulation Related to ADLs: Pt is currently +2 total assist for sit to stand  and SPT from EOB to chair, pt 40%. Pt with significant posterior lean in standing & narrow base of stance. Needs assist to advance R LE. ADL Comments: Pt was educated in Role of OT and participated in SPT in preparation for increased participation in functional mobility related to ADL's. Pt is +2 total assist, 40%. Static sitting @ EOB x64min w/ Min assist, static standing balance also w/ +2 total assist  secondary to posterior lean.  Cognition: Cognition Overall Cognitive Status: Within Functional Limits for tasks assessed Orientation Level: Oriented X4 Cognition Arousal/Alertness: Awake/alert Behavior During Therapy: WFL for tasks assessed/performed Overall Cognitive Status: Within Functional Limits for tasks assessed  Physical Exam: Blood pressure 125/58, pulse 78, temperature 97.9 F (36.6 C), temperature source Oral, resp. rate 18, height 5\' 10"  (1.778 m), weight 79.9 kg (176 lb 2.4 oz), SpO2 98.00%.  General: No acute distress Mood and affect are appropriate Heart: Regular rate and rhythm no rubs murmurs or extra sounds Lungs: Clear to auscultation, breathing unlabored, no rales or wheezes Abdomen: Positive bowel sounds, soft nontender to palpation, distended Extremities: No clubbing, cyanosis, or edema,  Left distal leg wound draining serosang, R prox hi[ wound draining serosang no erythema Skin: No evidence of breakdown, no evidence of rash Neurologic: Cranial nerves II through XII intact, motor strength is 5/5 in bilateral deltoid, bicep, tricep, grip, ,5/5 Lefthip flexor KE and  1/5 RHF,  knee extensors, 4/5 Bilateral ankle dorsiflexor and plantar flexor Sensory exam normal sensation to light touch and proprioception in bilateral upper and lower extremities Cerebellar exam normal finger to nose to finger as well as heel to shin in bilateral upper and lower extremities Musculoskeletal: Decreased range of motion RLE. Mild L wrist swelling  Results for orders placed during the  hospital encounter of 08/16/13 (from the past 48 hour(s))  CBC     Status: Abnormal   Collection Time    08/20/13  4:45 AM      Result Value Range   WBC 10.4  4.0 - 10.5 K/uL   RBC 2.73 (*) 4.22 - 5.81 MIL/uL   Hemoglobin 8.2 (*) 13.0 - 17.0 g/dL   Comment: POST TRANSFUSION SPECIMEN     DELTA CHECK NOTED   HCT 24.1 (*) 39.0 - 52.0 %   MCV 88.3  78.0 - 100.0 fL   MCH 30.0  26.0 - 34.0 pg   MCHC 34.0  30.0 - 36.0 g/dL   RDW 40.9 (*) 81.1 - 91.4 %   Platelets 103 (*) 150 - 400 K/uL   Comment: PLATELET  COUNT CONFIRMED BY SMEAR  BASIC METABOLIC PANEL     Status: Abnormal   Collection Time    08/20/13  4:45 AM      Result Value Range   Sodium 129 (*) 135 - 145 mEq/L   Potassium 4.3  3.5 - 5.1 mEq/L   Chloride 96  96 - 112 mEq/L   CO2 25  19 - 32 mEq/L   Glucose, Bld 119 (*) 70 - 99 mg/dL   BUN 51 (*) 6 - 23 mg/dL   Creatinine, Ser 5.62  0.50 - 1.35 mg/dL   Calcium 8.8  8.4 - 13.0 mg/dL   GFR calc non Af Amer 48 (*) >90 mL/min   GFR calc Af Amer 55 (*) >90 mL/min   Comment: (NOTE)     The eGFR has been calculated using the CKD EPI equation.     This calculation has not been validated in all clinical situations.     eGFR's persistently <90 mL/min signify possible Chronic Kidney     Disease.  CBC     Status: Abnormal   Collection Time    08/21/13  5:21 AM      Result Value Range   WBC 6.5  4.0 - 10.5 K/uL   RBC 2.52 (*) 4.22 - 5.81 MIL/uL   Hemoglobin 7.5 (*) 13.0 - 17.0 g/dL   HCT 86.5 (*) 78.4 - 69.6 %   MCV 88.1  78.0 - 100.0 fL   MCH 29.8  26.0 - 34.0 pg   MCHC 33.8  30.0 - 36.0 g/dL   RDW 29.5 (*) 28.4 - 13.2 %   Platelets 141 (*) 150 - 400 K/uL   Comment: DELTA CHECK NOTED     REPEATED TO VERIFY  BASIC METABOLIC PANEL     Status: Abnormal   Collection Time    08/21/13  5:21 AM      Result Value Range   Sodium 130 (*) 135 - 145 mEq/L   Potassium 4.0  3.5 - 5.1 mEq/L   Chloride 96  96 - 112 mEq/L   CO2 26  19 - 32 mEq/L   Glucose, Bld 127 (*) 70 - 99 mg/dL   BUN  44 (*) 6 - 23 mg/dL   Creatinine, Ser 4.40  0.50 - 1.35 mg/dL   Calcium 8.6  8.4 - 10.2 mg/dL   GFR calc non Af Amer 57 (*) >90 mL/min   GFR calc Af Amer 66 (*) >90 mL/min   Comment: (NOTE)     The eGFR has been calculated using the CKD EPI equation.     This calculation has not been validated in all clinical situations.     eGFR's persistently <90 mL/min signify possible Chronic Kidney     Disease.   No results found.  Post Admission Physician Evaluation: 1. Functional deficits secondary  to R IT hip fx due to fall. 2. Patient is admitted to receive collaborative, interdisciplinary care between the physiatrist, rehab nursing staff, and therapy team. 3. Patient's level of medical complexity and substantial therapy needs in context of that medical necessity cannot be provided at a lesser intensity of care such as a SNF. 4. Patient has experienced substantial functional loss from his/her baseline which was documented above under the "Functional History" and "Functional Status" headings.  Judging by the patient's diagnosis, physical exam, and functional history, the patient has potential for functional progress which will result in measurable gains while on inpatient rehab.  These gains will be of substantial and  practical use upon discharge  in facilitating mobility and self-care at the household level. 5. Physiatrist will provide 24 hour management of medical needs as well as oversight of the therapy plan/treatment and provide guidance as appropriate regarding the interaction of the two. 6. 24 hour rehab nursing will assist with bladder management, bowel management, safety, skin/wound care, disease management, medication administration, pain management and patient education  and help integrate therapy concepts, techniques,education, etc. 7. PT will assess and treat for/with: pre gait, gait training, endurance , safety, equipment, .   Goals are: min A. 8. OT will assess and treat for/with: ADLs,  safety, endurance, equipment.   Goals are: Min A. 9. SLP will assess and treat for/with: NA.  Goals are: NA. 10. Case Management and Social Worker will assess and treat for psychological issues and discharge planning. 11. Team conference will be held weekly to assess progress toward goals and to determine barriers to discharge. 12. Patient will receive at least 3 hours of therapy per day at least 5 days per week. 13. ELOS: 2 wks       14. Prognosis:  fair   Medical Problem List and Plan: 1. DVT Prophylaxis/Anticoagulation: Mechanical: Sequential compression devices, below knee Bilateral lower extremities 2. Pain Management: Dilaudid po on chronically but now on higher dose 3. Mood: Monitor mood, pt is discouraged 4. Neuropsych: This patient is capable of making decisions on his own behalf. 5.  Multiple myeloma, f/u Dr Darnelle Catalan as outpt. 6.  ABLA, S/P transfusion, monitor Hgb 7. HTN: Zestril, coreg 8.  BPH  Flomax , voiding, Check PVR    Erick Colace M.D. [facility] FAAPM&R (Sports Med, Neuromuscular Med) Diplomate Am Board of Electrodiagnostic Med Diplomate Am Board of Pain Medicine Fellow Am Board of Interventional Pain Physicians  08/21/2013

## 2013-08-21 NOTE — Discharge Summary (Signed)
Physician Discharge Summary  Timothy [name] MVH:[id number] DOB: 1928/08/21 DOA: 08/16/2013  PCP: [name], MD  Admit date: 08/16/2013 Discharge date: 08/21/2013  Recommendations for Outpatient Follow-up:  1. Pt will need to follow up with PCP in 2-3 weeks post discharge 2. Please obtain BMP to evaluate electrolytes and kidney function 3. Please also check CBC to evaluate Hg and Hct levels 4. Please note that one unit of blood was ordered for transfusion prior to discharged to inpatient rehabilitation, pt is clear for discharge post transfusion 5. CBC needs to be obtained in AM for follow up 6. Also, please note that FOBT pending on discharge so needs to be followed up 7. Due to high risk bleeding, pt was dischraged on Aspirin 325 mg PO QD for DVT prophylaxis   Discharge Diagnoses: Right hip fracture with post op acute blood loss anemia and urinary retention  Principal Problem:   Intertrochanteric fracture of right hip Active Problems:   CAD (coronary artery disease)   Acute blood loss anemia  Discharge Condition: Stable  Diet recommendation: Heart healthy diet discussed in details   History of present illness:  Timothy [name] is a 79 y.o. male who suffered a fall several hours prior to this admission when getting off of his lawn mower. In ED found to have R hip fracture, Dr. Ophelia Charter consulted.   Assessment/Plan:  Right hip fracture - per ortho, s/p OR on 9/28, plan [facility] IR at [facility] today Chronic systolic heart failure - most recent EF ~40%. Saw his cardiologist in office at the beginning of September and he was showing signs of fluid overload with edema and weight was 184 lbs. Better after Lasix as outpatient, weight 174 on admission. Weight is stable, continue to monitor I's and O's, daily weights.  Acute blood loss anemia - transfused 1U pRBC 9/30 with appropriate increase in Hg, however drop in Hg overnight noted again. FOBT ordered and one unit of PRBC. Pt is stable for  discharge to inpatient rehab post transfusion and need CBC repeat in AM.  Leukocytosis - likely reactive. Now resolved. HLD - continue statin  Multiple myeloma - followed as an outpatient by Dr. Darnelle Catalan  HTN - continue home meds  DVT Prophylaxis - aspirin 325 mg PO QD   Code Status: Full  Family Communication: wife bedside   Consultants:  Orthopedic surgery Inpatient rehabilitation team   Procedures/Studies: Dg Chest 1 View  08/16/2013    Cardiomegaly with mild pulmonary vascular congestion and mild basilar atelectasis.  Dg Hip Complete Right  08/16/2013  Displaced intertrochanteric fracture right femur with varus angulation.   Dg Femur Right  08/17/2013  nternal fixation as above.  Dg C-arm 1-60 Min-no Report  08/17/2013   Internal fixation as above.    Discharge Exam: Filed Vitals:   08/21/13 0600  BP: 125/58  Pulse: 78  Temp: 97.9 F (36.6 C)  Resp: 18   Filed Vitals:   08/20/13 0613 08/20/13 0942 08/20/13 2113 08/21/13 0600  BP: 119/63 129/51 134/69 125/58  Pulse: 77 80 77 78  Temp: 97.7 F (36.5 C) 97.2 F (36.2 C) 98.4 F (36.9 C) 97.9 F (36.6 C)  TempSrc: Oral Oral Oral Oral  Resp: 19 23 19 18   Height:      Weight:      SpO2: 99% 99% 99% 98%    General: Pt is alert, follows commands appropriately, not in acute distress Cardiovascular: Regular rate and rhythm, S1/S2 +, no murmurs, no rubs, no gallops Respiratory: Clear  to auscultation bilaterally, no wheezing, no crackles, no rhonchi Abdominal: Soft, non tender, non distended, bowel sounds +, no guarding Extremities: no edema, no cyanosis, pulses palpable bilaterally DP and PT Neuro: Grossly nonfocal  Discharge Instructions  Discharge Orders   Future Appointments Provider Department Dept Phone   09/10/2013 11:00 AM [facility] [phone]   09/17/2013 11:00 AM [name], MD [facility] [phone]   Future Orders  Complete By Expires   Diet - low sodium heart healthy  As directed    Full weight bearing  As directed    Increase activity slowly  As directed        Medication List    STOP taking these medications       aspirin EC 81 MG tablet  Replaced by:  aspirin 325 MG tablet      TAKE these medications       allopurinol 300 MG tablet  Commonly known as:  ZYLOPRIM  Take 0.5 tablets (150 mg total) by mouth daily.     aspirin 325 MG tablet  Commonly known as:  BAYER ASPIRIN  Take 1 tablet (325 mg total) by mouth daily.     atorvastatin 10 MG tablet  Commonly known as:  LIPITOR  Take 1 tablet (10 mg total) by mouth daily.     B COMPLETE PO  Take 0.5 tablets by mouth 2 (two) times daily.     calcium carbonate 500 MG chewable tablet  Commonly known as:  TUMS - dosed in mg elemental calcium  Chew 1 tablet (200 mg of elemental calcium total) by mouth 2 (two) times daily with a meal.     carvedilol 6.25 MG tablet  Commonly known as:  COREG  Take 6.25 mg by mouth 2 (two) times daily with a meal.     CO Q-10 PO  Take 1 tablet by mouth every morning.     DULoxetine 30 MG capsule  Commonly known as:  CYMBALTA  Take 30 mg by mouth every evening.     HYDROmorphone 4 MG tablet  Commonly known as:  DILAUDID  Take 4 mg by mouth every 4 (four) hours as needed for pain.     lisinopril 20 MG tablet  Commonly known as:  PRINIVIL,ZESTRIL  Take 20 mg by mouth every evening.     multivitamin capsule  Take 1 capsule by mouth every morning.     nitroGLYCERIN 0.4 MG SL tablet  Commonly known as:  NITROSTAT  Place 0.4 mg under the tongue every 5 (five) minutes as needed for chest pain.     oxyCODONE-acetaminophen 5-325 MG per tablet  Commonly known as:  ROXICET  Take 2 tablets by mouth every 4 (four) hours as needed for pain.     tamsulosin 0.4 MG Caps capsule  Commonly known as:  FLOMAX  Take 0.4 mg by mouth at bedtime.     vitamin C 500 MG tablet  Commonly known as:  ASCORBIC ACID   Take 500 mg by mouth every morning.           Follow-up Information   Follow up with YATES,MARK C, MD In 2 weeks.   Specialty:  Orthopedic Surgery   Contact information:   [address] [place] [address] [phone]       Follow up with [name], MD In 2 weeks.   Specialty:  Oncology   Contact information:   [address] [place] [address] [phone]        The results of significant diagnostics from this hospitalization (including imaging, microbiology, ancillary and laboratory) are listed below for reference.     Microbiology: No results found for this or any previous visit (from the past 240 hour(s)).   Labs: Basic Metabolic Panel:  Recent Labs Lab 08/16/13 1941 08/18/13 0435 08/19/13 0441 08/20/13 0445 08/21/13 0521  NA 132* 129* 129* 129* 130*  K 4.2 4.5 4.2 4.3 4.0  CL 98 97 96 96 96  CO2 23 23 25 25 26   GLUCOSE 108* 131* 135* 119* 127*  BUN 27* 24* 36* 51* 44*  CREATININE 1.09 1.15 1.35 1.34 1.15  CALCIUM 9.1 8.7 8.8 8.8 8.6   Liver Function Tests:  Recent Labs Lab 08/16/13 1941  AST 25  ALT 16  ALKPHOS 52  BILITOT 0.4  PROT 6.8  ALBUMIN 3.9   CBC:  Recent Labs Lab 08/16/13 1941 08/18/13 0435 08/19/13 0441 08/20/13 0445 08/21/13 0521  WBC 15.0* 16.0* 11.7* 10.4 6.5  NEUTROABS 11.6*  --   --   --   --   HGB 9.3* 8.4* 6.7* 8.2* 7.5*  HCT 27.9* 25.7* 20.3* 24.1* 22.2*  MCV 89.4 91.1 89.8 88.3 88.1  PLT 177 157 128* 103* 141*   BNP: BNP (last 3 results)  Recent Labs  12/13/12 0831  PROBNP 84.0    SIGNED: Time coordinating discharge: Over 30 minutes  Debbora Presto, MD  [facility] 08/21/2013, 9:25 AM Pager [phone]  If 7PM-7AM, please contact night-coverage [email or web address] Password [email or web address]

## 2013-08-21 NOTE — Progress Notes (Signed)
Discussed with Pam, RN, concerning admission to inpt rehab today post transfusion. I will plan to admit to inpt rehab. [phone]

## 2013-08-22 ENCOUNTER — Inpatient Hospital Stay (HOSPITAL_COMMUNITY): Payer: Medicare Other | Admitting: Occupational Therapy

## 2013-08-22 ENCOUNTER — Inpatient Hospital Stay (HOSPITAL_COMMUNITY): Payer: Medicare Other

## 2013-08-22 DIAGNOSIS — S72143A Displaced intertrochanteric fracture of unspecified femur, initial encounter for closed fracture: Secondary | ICD-10-CM

## 2013-08-22 LAB — COMPREHENSIVE METABOLIC PANEL
ALT: 22 U/L (ref 0–53)
AST: 28 U/L (ref 0–37)
Albumin: 2.8 g/dL — ABNORMAL LOW (ref 3.5–5.2)
Alkaline Phosphatase: 40 U/L (ref 39–117)
BUN: 28 mg/dL — ABNORMAL HIGH (ref 6–23)
Chloride: 95 mEq/L — ABNORMAL LOW (ref 96–112)
GFR calc Af Amer: 90 mL/min — ABNORMAL LOW (ref >90)
Glucose, Bld: 112 mg/dL — ABNORMAL HIGH (ref 70–99)
Potassium: 4.1 mEq/L (ref 3.5–5.1)
Sodium: 130 mEq/L — ABNORMAL LOW (ref 135–145)
Total Bilirubin: 1.2 mg/dL (ref 0.3–1.2)
Total Protein: 5.8 g/dL — ABNORMAL LOW (ref 6.0–8.3)

## 2013-08-22 LAB — CBC WITH DIFFERENTIAL/PLATELET
Basophils Absolute: 0 10*3/uL (ref 0.0–0.1)
Eosinophils Absolute: 0.1 10*3/uL (ref 0.0–0.7)
Hemoglobin: 9.4 g/dL — ABNORMAL LOW (ref 13.0–17.0)
Lymphs Abs: 1.6 10*3/uL (ref 0.7–4.0)
MCH: 30 pg (ref 26.0–34.0)
Monocytes Relative: 11 % (ref 3–12)
Neutro Abs: 5.6 10*3/uL (ref 1.7–7.7)
Neutrophils Relative %: 68 % (ref 43–77)
Platelets: 170 10*3/uL (ref 150–400)
RBC: 3.13 MIL/uL — ABNORMAL LOW (ref 4.22–5.81)
WBC: 8.3 10*3/uL (ref 4.0–10.5)

## 2013-08-22 LAB — URINE CULTURE
Colony Count: NO GROWTH
Culture: NO GROWTH

## 2013-08-22 LAB — TYPE AND SCREEN
ABO/RH(D): B NEG
Unit division: 0

## 2013-08-22 MED ORDER — HYDROMORPHONE HCL 2 MG PO TABS
6.0000 mg | ORAL_TABLET | Freq: Every day | ORAL | Status: DC
  Administered 2013-08-22 – 2013-08-27 (×7): 6 mg via ORAL
  Filled 2013-08-22 (×5): qty 3

## 2013-08-22 MED ORDER — HYDROMORPHONE HCL 2 MG PO TABS
4.0000 mg | ORAL_TABLET | Freq: Four times a day (QID) | ORAL | Status: DC | PRN
  Administered 2013-08-22 – 2013-08-27 (×6): 6 mg via ORAL
  Filled 2013-08-22 (×8): qty 3

## 2013-08-22 MED ORDER — HYDROMORPHONE HCL 2 MG PO TABS
6.0000 mg | ORAL_TABLET | Freq: Two times a day (BID) | ORAL | Status: DC
  Administered 2013-08-22 – 2013-08-27 (×5): 6 mg via ORAL
  Filled 2013-08-22 (×8): qty 3
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
## 2013-08-22 NOTE — Progress Notes (Signed)
[facility] Individual Statement of Services  Patient Name:  Timothy [name]  Date:  08/22/2013  Welcome to the [facility].  Our goal is to provide you with an individualized program based on your diagnosis and situation, designed to meet your specific needs.  With this comprehensive rehabilitation program, you will be expected to participate in at least 3 hours of rehabilitation therapies Monday-Friday, with modified therapy programming on the weekends.  Your rehabilitation program will include the following services:  Physical Therapy (PT), Occupational Therapy (OT), 24 hour per day rehabilitation nursing, Case Management (Social Worker), Rehabilitation Medicine, Nutrition Services and Pharmacy Services  Weekly team conferences will be held on Tuesdays to discuss your progress.  Your Social Worker will talk with you frequently to get your input and to update you on team discussions.  Team conferences with you and your family in attendance may also be held.  Expected length of stay: 2-3 weeks  Overall anticipated outcome: supervision  Depending on your progress and recovery, your program may change. Your Social Worker will coordinate services and will keep you informed of any changes. Your Social Worker's name and contact numbers are listed  below.  The following services may also be recommended but are not provided by the [facility]:   Driving Evaluations  Home Health Rehabiltiation Services  Outpatient Rehabilitation Services    Arrangements will be made to provide these services after discharge if needed.  Arrangements include referral to agencies that provide these services.  Your insurance has been verified to be:  Medicare and [employer] Your primary doctor is:  Dr. Darnelle Catalan  Pertinent information will be shared with your doctor and your insurance company.  Social Worker:  [place], [place] [phone] or [phone]   Information discussed with and copy given to patient by: Amada Jupiter, 08/22/2013, 3:41 PM

## 2013-08-22 NOTE — Progress Notes (Signed)
Subjective/Complaints: Had a reasonable night. Pain under fair control at present. Pain worse with activity.  A 12 point review of systems has been performed and if not noted above is otherwise negative.   Objective: Vital Signs: Blood pressure 155/74, pulse 75, temperature 98.5 F (36.9 C), temperature source Oral, resp. rate 18, height 5\' 10"  (1.778 m), weight 82.2 kg (181 lb 3.5 oz), SpO2 94.00%. No results found.  Recent Labs  08/21/13 0521 08/22/13 0620  WBC 6.5 8.3  HGB 7.5* 9.4*  HCT 22.2* 27.5*  PLT 141* 170    Recent Labs  08/21/13 0521 08/22/13 0620  NA 130* 130*  K 4.0 4.1  CL 96 95*  GLUCOSE 127* 112*  BUN 44* 28*  CREATININE 1.15 0.89  CALCIUM 8.6 9.2   CBG (last 3)  No results found for this basename: GLUCAP,  in the last 72 hours  Wt Readings from Last 3 Encounters:  08/22/13 82.2 kg (181 lb 3.5 oz)  08/19/13 79.9 kg (176 lb 2.4 oz)  08/19/13 79.9 kg (176 lb 2.4 oz)    Physical Exam:  General: No acute distress  Mood and affect are appropriate  Heart: Regular rate and rhythm no rubs murmurs or extra sounds  Lungs: Clear to auscultation, breathing unlabored, no rales or wheezes  Abdomen: Positive bowel sounds, soft nontender to palpation, distended  Extremities: No clubbing, cyanosis, or edema, Left distal leg wound draining serosang, R prox hi[ wound draining serosang no erythema  Skin: right hip clean, intact with minimal drainage, lac left ankle with mild serosanginous dc Neurologic: Cranial nerves II through XII intact, motor strength is 5/5 in bilateral deltoid, bicep, tricep, grip, ,5/5 Lefthip flexor KE and 1/5 RHF, knee extensors, 4/5 Bilateral ankle dorsiflexor and plantar flexor  Sensory exam normal sensation to light touch and proprioception in bilateral upper and lower extremities  Cerebellar exam normal finger to nose to finger as well as heel to shin in bilateral upper and lower extremities  Musculoskeletal: Decreased range of motion  RLE. Mild L wrist swelling   Assessment/Plan: 1. Functional deficits secondary to right intertrochanteric hip fx which require 3+ hours per day of interdisciplinary therapy in a comprehensive inpatient rehab setting. Physiatrist is providing close team supervision and 24 hour management of active medical problems listed below. Physiatrist and rehab team continue to assess barriers to discharge/monitor patient progress toward functional and medical goals. FIM:                               Memory Memory: 5-Recognizes or recalls 90% of the time/requires cueing < 10% of the time  Medical Problem List and Plan:  1. DVT Prophylaxis/Anticoagulation: Mechanical: Sequential compression devices, below knee Bilateral lower extremities  2. Pain Management: Dilaudid po on chronically---will schedule 6mg  qam, continue q6prn 3. Mood: Monitor mood, pt is discouraged  4. Neuropsych: This patient is capable of making decisions on his own behalf.  5. Multiple myeloma, f/u Dr Darnelle Catalan as outpt.  6. ABLA, S/P transfusion, monitor Hgb  7. HTN: Zestril, coreg  8. Renal insufficiency-bun,cr improving---recheck monday 9. BPH Flomax , voiding, Check PVR  LOS (Days) 1 A FACE TO FACE EVALUATION WAS PERFORMED  Linnae Rasool T 08/22/2013 8:21 AM

## 2013-08-22 NOTE — Evaluation (Signed)
Occupational Therapy Assessment and Plan & Session Note  Patient Details  Name: Timothy [name] MRN: [id number] Date of Birth: [birth year]  OT Diagnosis: abnormal posture, acute pain and muscle weakness (generalized) Rehab Potential: Rehab Potential: Good ELOS: 14-21 days   Today's Date: 08/22/2013  Problem List:  Patient Active Problem List   Diagnosis Date Noted  . Acute blood loss anemia 08/19/2013  . Intertrochanteric fracture of right hip 08/16/2013  . Acute on chronic systolic congestive heart failure 03/10/2012  . Hypokalemia 03/10/2012  . Osteomyelitis of left foot 03/06/2012  . Open wound of left foot 03/06/2012  . Foot ulcer, left 01/10/2012  . Hyponatremia 01/10/2012  . Systolic CHF, chronic 11/08/2011  . CAD (coronary artery disease) 11/08/2011  . S/P CABG (coronary artery bypass graft) 11/08/2011  . Anemia 11/06/2011  . Myeloma 09/12/2011  . Ischemic cardiomyopathy 04/21/2011  . Multiple myeloma 04/21/2011  . Shortness of breath dyspnea 04/21/2011  . Hypertension 04/21/2011  . Renal insufficiency 04/21/2011    Past Medical History:  Past Medical History  Diagnosis Date  . ASCVD (arteriosclerotic cardiovascular disease)   . Anemia     chronic mild anemia  . Gout   . Hypertension   . Lumbar disc disease     post laminectomy  . Chronic back pain   . History of echocardiogram 11/02/2010    EF range of 30 to 35% / There is hypokinsesis of the basal-mild inferolateral myocardium  . CHF (congestive heart failure)   . Heart murmur   . Ischemic cardiomyopathy   . Renal insufficiency   . Peripheral neuropathy   . Inferior myocardial infarction 1986  . Hyperlipidemia   . SOB (shortness of breath)   . Fatigue   . Peripheral neuropathy   . Coronary artery disease     CABG 2002 by [name] with LIMA to LAD, SVG to intermediate, SVG to LCX, & SVG to PL. Last nuclear in 2012 showing large inferior scar with EF of 39%.   . Blood transfusion     DEC 2012 - TWO  UNITS  . 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  PT HAS BEEN OFF CHEMO SINCE HIS HOSPITALIZATION FEB 2013 FOR LEFT FOOT INFECTION  . Cancer   . History of shingles MARCH 2009    SHINGLE LESIONS WERE AROUND RIGHT EYE--PT HAS RESIDUAL ITCHING AROUND THE EYE.  . Osteomyelitis     s/p toe amputation  . Chronic systolic heart failure     EF of 30% per echo 02/2012  . Protein malnutrition    Past Surgical History:  Past Surgical History  Procedure Laterality Date  . Laminectomy    . Inguinal hernia repair    . Cardiac catheterization  06/20/2000    Severe coronary disease (totally occluded right artery, 90% left circumflex, 50-60% intermediate, and 90-95% ostial left anterior descending)   . Tonsillectomy    . Coronary artery bypass graft  2002    x4 / Left internal mammary artery to the LAD.  / Saphenous vein graft to the right posterolateral.  /  Saphenous vein graft to  the ramus intermedius. / Saphenous vein graft to the circumflex marginal     . Amputation  03/06/2012    Procedure: AMPUTATION RAY;  Surgeon: Harvie Junior, MD;  Location: [facility];  Service: Orthopedics;  Laterality: Left;  First Ray Amputation  . Intramedullary (im) nail intertrochanteric Right 08/17/2013    Procedure: INTRAMEDULLARY (IM)  NAIL INTERTROCHANTRIC;  Surgeon: Eldred Manges, MD;  Location: [facility];  Service: Orthopedics;  Laterality: Right;   Clinical Impression: [name] is a 79 y.o. male with history of CAD, peripheral neuropathy, MM, lumbago; who fell while getting off his lawnmower on 08/16/13, landed on cement with resultant right hip pain and inability to walk. Patient reported shorts getting caught on handle bar. Xrays with right IT hip fracture with lesser trochanter as separate piece. Patient taken to OR on 08/18/13 for right trochanteric nail with pin lag screw by Dr. Ophelia Charter. Noted to have mild hematoma right hip. Post op with ABLA and transfused with 1 unit  PRBC as hgb 6.7. Has had poor UOP as well as worsening of chronic hyponatremia. PT evaluation done yesterday with limitation in mobility as well ROS positive for R hip pain, Left sided low back pain Patient transferred to CIR on 08/21/2013.    Patient currently requires min assist -> total assist X2 (min assist with dynamic sitting to donn shirt, max assist with bed mobility, total assist X2 with sit<>stands, functional mobility, & LB dressing) secondary to muscle weakness and muscle joint tightness and decreased sitting balance, decreased standing balance, decreased postural control, decreased balance strategies and difficulty maintaining precautions. Pain is one of patient's biggest barriers currently. Prior to hospitalization, patient could complete ADLs independently.   Patient will benefit from skilled intervention to increase independence with basic self-care skills prior to discharge home with care partner.  Anticipate patient will require 24 hour supervision and follow up home health.  OT - End of Session Activity Tolerance: Tolerates 10 - 20 min activity with multiple rests Endurance Deficit: Yes Endurance Deficit Description: patient limited by pain OT Assessment Rehab Potential: Good Barriers to Discharge:  (none known at this time) OT Patient demonstrates impairments in the following area(s): Balance;Endurance;Motor;Pain;Safety;Sensory;Skin Integrity OT Basic ADL's Functional Problem(s): Grooming;Bathing;Dressing;Toileting OT Advanced ADL's Functional Problem(s): Light Housekeeping OT Transfers Functional Problem(s): Toilet;Tub/Shower OT Additional Impairment(s): None OT Plan OT Intensity: Minimum of 1-2 x/day, 45 to 90 minutes OT Frequency: 5 out of 7 days OT Duration/Estimated Length of Stay: 14-21 days OT Treatment/Interventions: Metallurgist training;Community reintegration;Discharge planning;DME/adaptive equipment instruction;Functional mobility training;Pain  management;Patient/family education;Psychosocial support;Self Care/advanced ADL retraining;Skin care/wound managment;Splinting/orthotics;Therapeutic Activities;Therapeutic Exercise;UE/LE Strength taining/ROM;UE/LE Coordination activities;Wheelchair propulsion/positioning OT Self Feeding Anticipated Outcome(s): independent - currently at this level OT Basic Self-Care Anticipated Outcome(s): supervision OT Toileting Anticipated Outcome(s): supervision OT Bathroom Transfers Anticipated Outcome(s): supervision OT Recommendation Recommendations for Other Services:  (none at this time) Patient destination: Home Follow Up Recommendations: Home health OT Equipment Recommended: 3 in 1 bedside comode;Tub/shower seat  Precautions/Restrictions  Precautions Precautions: Fall Restrictions Weight Bearing Restrictions: Yes RLE Weight Bearing: Weight bearing as tolerated  General Chart Reviewed: Yes Family/Caregiver Present: No  Vital Signs Therapy Vitals Temp: 98.5 F (36.9 C) Temp src: Oral Pulse Rate: 75 Resp: 18 BP: 155/74 mmHg Patient Position, if appropriate: Lying Oxygen Therapy SpO2: 94 % O2 Device: None (Room air)  Pain Pain Assessment Pain Assessment: 0-10 Pain Score: 5  Pain Type: Acute pain Pain Location: Hip Pain Orientation: Right Pain Descriptors / Indicators: Aching Pain Onset: On-going Pain Intervention(s): Medication (See eMAR) Multiple Pain Sites: No  Home Living/Prior Functioning Home Living Available Help at Discharge: Family;Available 24 hours/day Type of Home: House Home Access: Stairs to enter [employer] of Steps: 1 threshold Entrance Stairs-Rails: None Home Layout: Two level;Able to live on main level with bedroom/bathroom Additional Comments: patient states he has no grab bars, but does have handicap  height toilet seats  Lives With: Spouse IADL History Homemaking Responsibilities: No Current License: Yes Mode of Transportation:  Car Occupation: Retired 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, site: golf, workout, yardwork Prior Function Level of Independence: Independent with basic ADLs;Independent with homemaking with ambulation;Independent with gait;Independent with transfers Driving: Yes Comments: wrist splint to left wrist due to surgical procedure July  ADL - See FIM for more information ADL Eating: Independent Upper Body Dressing: Minimal assistance Lower Body Dressing:  (total assist X2)  Vision/Perception  Vision - History Baseline Vision: Wears glasses only for reading Visual History: Macular degeneration Patient Visual Report: No change from baseline Perception Perception: Within Functional Limits Praxis Praxis: Intact   Cognition Overall Cognitive Status: Within Functional Limits for tasks assessed Arousal/Alertness: Awake/alert Orientation Level: Oriented X4 Memory: Appears intact Awareness: Appears intact Problem Solving: Appears intact Safety/Judgment: Appears intact  Sensation Sensation Light Touch: Appears Intact (BUEs) Stereognosis: Appears Intact (BUEs) Hot/Cold: Appears Intact (BUEs) Proprioception: Appears Intact (BUEs) Additional Comments: Patient with complaints of neuropathy which impact his ability to complete fine motor tasks (pre-morbid status) Coordination Gross Motor Movements are Fluid and Coordinated: Yes Fine Motor Movements are Fluid and Coordinated: Yes  Motor  WFL for motor; patient with increased pain causing any movement involving RLE to be difficult.   Trunk/Postural Assessment  WFL  Balance Decreased overall balance secondary to weakness throughout BLEs. Patient able to maintain dynamic standing with max assist (weight shifting).   Extremity/Trunk Assessment RUE Assessment RUE Assessment: Within Functional Limits (can benefit from strengthening; patient with c/o neuropathy) LUE Assessment LUE Assessment: Within  Functional Limits (can benefit from strengthening; fractured wrist in july)  FIM:  FIM - Eating Eating Activity: 7: Complete independence:no helper FIM - Grooming Grooming: 0: Activity did not occur FIM - Bathing Bathing: 0: Activity did not occur FIM - Upper Body Dressing/Undressing Upper body dressing/undressing steps patient completed: Thread/unthread right sleeve of pullover shirt/dresss;Thread/unthread left sleeve of pullover shirt/dress;Put head through opening of pull over shirt/dress;Pull shirt over trunk Upper body dressing/undressing: 4: Steadying assist FIM - Lower Body Dressing/Undressing Lower body dressing/undressing: 1: Two helpers FIM - Games developer Transfer: 2: Supine > Sit: Max A (lifting assist/Pt. 25-49%);1: Two helpers   Refer to Care Plan for Long Term Goals  Recommendations for other services: None  Discharge Criteria: Patient will be discharged from OT if patient refuses treatment 3 consecutive times without medical reason, if treatment goals not met, if there is a change in medical status, if patient makes no progress towards goals or if patient is discharged from hospital.  The above assessment, treatment plan, treatment alternatives and goals were discussed and mutually agreed upon: by patient  -------------------------------------------------------------------------------------------------------------------------  SESSION NOTE [id number] - 60 Minutes Individual Therapy Patient with 5/10 complaints of pain, notified RN and worked on repositioning patient.  Initial 1:1 occupational therapy evaluation completed. Focused skilled intervention on pain management, bed mobility, UB/LB dressing, sit<>stands with RW, functional mobility with RW, bed side chair transfer, weight bearing through RLE, and overall activity tolerance/endurance. Patient's biggest barrier is currently pain. At end of session, left patient seated in bed side chair with call bell  & phone within reach.   Kim Oki 08/22/2013, 12:14 PM

## 2013-08-22 NOTE — Evaluation (Signed)
Physical Therapy Assessment and Plan  Patient Details  Name: Timothy [name] MRN: [id number] Date of Birth: 1928-01-16  PT Diagnosis: Abnormality of gait, Difficulty walking, Muscle weakness and Pain in R hip Rehab Potential: Good ELOS: 2-2.5 weeks   Today's Date: 08/22/2013 Time: Treatment Session 1: 1100-1145; Treatment Session 2: 1520-1610 Time Calculation (min): Treatment Session 1: 45 min; Treatment Session 2:  Problem List:  Patient Active Problem List   Diagnosis Date Noted  . Acute blood loss anemia 08/19/2013  . Intertrochanteric fracture of right hip 08/16/2013  . Acute on chronic systolic congestive heart failure 03/10/2012  . Hypokalemia 03/10/2012  . Osteomyelitis of left foot 03/06/2012  . Open wound of left foot 03/06/2012  . Foot ulcer, left 01/10/2012  . Hyponatremia 01/10/2012  . Systolic CHF, chronic 11/08/2011  . CAD (coronary artery disease) 11/08/2011  . S/P CABG (coronary artery bypass graft) 11/08/2011  . Anemia 11/06/2011  . Myeloma 09/12/2011  . Ischemic cardiomyopathy 04/21/2011  . Multiple myeloma 04/21/2011  . Shortness of breath dyspnea 04/21/2011  . Hypertension 04/21/2011  . Renal insufficiency 04/21/2011    Past Medical History:  Past Medical History  Diagnosis Date  . ASCVD (arteriosclerotic cardiovascular disease)   . Anemia     chronic mild anemia  . Gout   . Hypertension   . Lumbar disc disease     post laminectomy  . Chronic back pain   . History of echocardiogram 11/02/2010    EF range of 30 to 35% / There is hypokinsesis of the basal-mild inferolateral myocardium  . CHF (congestive heart failure)   . Heart murmur   . Ischemic cardiomyopathy   . Renal insufficiency   . Peripheral neuropathy   . Inferior myocardial infarction 1986  . Hyperlipidemia   . SOB (shortness of breath)   . Fatigue   . Peripheral neuropathy   . Coronary artery disease     CABG 2002 by [name] with LIMA to LAD, SVG to intermediate, SVG to  LCX, & SVG to PL. Last nuclear in 2012 showing large inferior scar with EF of 39%.   . Blood transfusion     DEC 2012 - TWO UNITS  . 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  PT HAS BEEN OFF CHEMO SINCE HIS HOSPITALIZATION FEB 2013 FOR LEFT FOOT INFECTION  . Cancer   . History of shingles MARCH 2009    SHINGLE LESIONS WERE AROUND RIGHT EYE--PT HAS RESIDUAL ITCHING AROUND THE EYE.  . Osteomyelitis     s/p toe amputation  . Chronic systolic heart failure     EF of 30% per echo 02/2012  . Protein malnutrition    Past Surgical History:  Past Surgical History  Procedure Laterality Date  . Laminectomy    . Inguinal hernia repair    . Cardiac catheterization  06/20/2000    Severe coronary disease (totally occluded right artery, 90% left circumflex, 50-60% intermediate, and 90-95% ostial left anterior descending)   . Tonsillectomy    . Coronary artery bypass graft  2002    x4 / Left internal mammary artery to the LAD.  / Saphenous vein graft to the right posterolateral.  /  Saphenous vein graft to  the ramus intermedius. / Saphenous vein graft to the circumflex marginal     . Amputation  03/06/2012    Procedure: AMPUTATION RAY;  Surgeon: Harvie Junior, MD;  Location: [facility];  Service: Orthopedics;  Laterality: Left;  First Ray Amputation  . Intramedullary (im) nail intertrochanteric Right 08/17/2013    Procedure: INTRAMEDULLARY (IM) NAIL INTERTROCHANTRIC;  Surgeon: Eldred Manges, MD;  Location: [facility];  Service: Orthopedics;  Laterality: Right;    Assessment & Plan Clinical Impression: Timothy [name] is a 79 y.o. male with history of CAD, peripheral neuropathy, MM, lumbago; who fell while getting off his lawnmower on 08/16/13, landed on cement with resultant right hip pain and inability to walk. Patient reported shorts getting caught on handle bar. Xrays with right IT hip fracture with lesser trochanter as separate piece. Patient taken to OR  on 08/18/13 for right trochanteric nail with pin lag screw by Dr. Ophelia Charter. Noted to have mild hematoma right hip. Post op with ABLA and transfused with 1 unit PRBC as hgb 6.7. Has had poor UOP as well as worsening of chronic hyponatremia. PT evaluation done yesterday with limitation in mobility as well  ROS positive for R hip pain, Left sided low back pain Patient transferred to CIR on 08/21/2013 .   Patient currently requires total with mobility secondary to muscle weakness and pain in R hip.  Prior to hospitalization, patient was independent  with mobility and lived with Spouse in a House home.  Home access is 1 thresholdStairs to enter.  Patient will benefit from skilled PT intervention to maximize safe functional mobility, minimize fall risk and decrease caregiver burden for planned discharge home with 24 hour supervision/assist.  Anticipate patient will benefit from follow up [facility] at discharge.  PT - End of Session Endurance Deficit: Yes Endurance Deficit Description: Pt limited 2/2 pain PT Assessment Rehab Potential: Good PT Patient demonstrates impairments in the following area(s): Balance;Endurance;Pain;Safety PT Transfers Functional Problem(s): Bed Mobility;Bed to Chair;Car;Furniture PT Locomotion Functional Problem(s): Stairs;Wheelchair Mobility;Ambulation PT Plan PT Intensity: Minimum of 1-2 x/day ,45 to 90 minutes PT Frequency: 5 out of 7 days PT Duration Estimated Length of Stay: 2-2.5 weeks PT Treatment/Interventions: Ambulation/gait training;DME/adaptive equipment instruction;Neuromuscular re-education;Stair training;UE/LE Strength taining/ROM;Wheelchair propulsion/positioning;UE/LE Coordination activities;Therapeutic Activities;Pain management;Discharge planning;Balance/vestibular training;Disease management/prevention;Functional mobility training;Patient/family education;Therapeutic Exercise PT Transfers Anticipated Outcome(s): Supervision to Min A  PT Locomotion Anticipated  Outcome(s): Supervision to Min A PT Recommendation Follow Up Recommendations: Home health PT Equipment Recommended: Wheelchair (measurements);Wheelchair cushion (measurements);Rolling walker with 5" wheels Equipment Details: Need and size of w/c TBD  Skilled Therapeutic Intervention Treatment Session 1: 1:1. Pt received sitting in arm chair next to bed, pleasant but reporting 8/10 pain in R hip. RN made aware of pt, pt unable to receive pain medicine at this time. PT evaluation performed and tx initiated, see detailed objective information below. Pt A&Ox4, very anxious about performing functional mobility 2/2 pain. Pt requested to use BSC, req total A x 2persons for safe SPT arm chair<>BSC. Pt unable to achieve full upright posture for increased stability 2/2 pain in R LE that is exacerbated w/ WB. Pt req max to total A by therapist to maintain stability while nurse tech provided total A for management of clothing. Pericare not performed as pt did not void or have BM. Therapist encouraged pt to t/f to w/c to trial w/c propulsion as well as sit up for lunch at end of therapy session, pt refusing 2/2 pain and requesting to go back to bed. During SPT BSC>bed w/ total A x2persons, pt became extremely anxious and unsteady on feet requiring guided assist by therapist and nurse tech to bed. Pt req max A x1person for t/f sit>sup in bed. Pt semi-reclined in bed  w/ all needs in reach at end of tx session, bed alarm on and positioned upright for lunch.   Treatment Session 2: 1:1. Pt received supine in bed, ready for therapy and verbalized low level of pain. Pt req max A w/ doffing brief and donning shorts bed level w/ mod verbal and tactile cues for bilateral rolling. Pt performed supine therex for R LE strength/ROM including: 2x10 ankle pumps, quad sets, glute sets, AAROM- heel slides and hip abd. Pt req max A x1person to t/f sup>sit EOB and total A x2persons to perform SPT bed>w/c w/ RW. Pt w/ increased anxiety  during SPT, req mod step-by-step verbal cues for seq and to remain calm. Pt able to propel w/c x200' at end of tx session w/ B UE and supervision. Pt sitting in w/c at end of tx session w/ all needs in reach.    PT Evaluation Precautions/Restrictions Precautions Precautions: Fall Restrictions Weight Bearing Restrictions: Yes RLE Weight Bearing: Weight bearing as tolerated General Amount of Missed PT Time (min): 15 Minutes Missed Time Reason: Patient fatigue;Pain Family/Caregiver Present: No Vital Signs  Pain Pain Assessment Pain Assessment: 0-10 Pain Score: 8  Pain Type: Acute pain Pain Location: Hip Pain Orientation: Right Pain Descriptors / Indicators: Aching Pain Intervention(s): RN made aware Home Living/Prior Functioning Home Living Available Help at Discharge: Family;Available 24 hours/day Type of Home: House Home Access: Stairs to enter [employer] of Steps: 1 threshold Entrance Stairs-Rails: None Home Layout: Two level;Able to live on main level with bedroom/bathroom  Lives With: Spouse Prior Function Level of Independence: Independent with homemaking with ambulation;Independent with gait;Independent with transfers Driving: Yes Comments: wrist splint to left wrist due to surgical procedure July Vision/Perception  Vision - History Baseline Vision: Wears glasses only for reading Patient Visual Report: No change from baseline Perception Perception: Within Functional Limits Praxis Praxis: Intact  Cognition Overall Cognitive Status: Within Functional Limits for tasks assessed Arousal/Alertness: Awake/alert Orientation Level: Oriented X4 Memory: Appears intact Awareness: Appears intact Problem Solving: Appears intact Safety/Judgment: Impaired Comments: Decreased safety/judgement during functional transfers 2/2 anxiousness Sensation Sensation Light Touch: Appears Intact Proprioception: Appears Intact Coordination Gross Motor Movements are Fluid  and Coordinated: Yes Motor  Motor Motor: Within Functional Limits  Mobility Bed Mobility Bed Mobility: Sit to Supine Sit to Supine: 2: Max assist Sit to Supine - Details: Verbal cues for sequencing;Verbal cues for precautions/safety;Verbal cues for technique;Tactile cues for placement;Tactile cues for sequencing Transfers Transfers: Yes Sit to Stand: 1: +2 Total assist Sit to Stand Details: Manual facilitation for weight shifting;Verbal cues for precautions/safety;Verbal cues for technique;Verbal cues for sequencing;Verbal cues for safe use of DME/AE;Tactile cues for weight shifting;Tactile cues for sequencing;Tactile cues for posture Sit to Stand Details (indicate cue type and reason): Pt requested that therapist position hands under arms behind back to assit for stability and lifting Stand to Sit: 1: +2 Total assist Stand to Sit Details (indicate cue type and reason): Verbal cues for precautions/safety;Verbal cues for technique;Verbal cues for sequencing;Tactile cues for sequencing;Tactile cues for placement Stand Pivot Transfers: 1: +2 Total assist Stand Pivot Transfer Details: Tactile cues for sequencing;Verbal cues for sequencing;Verbal cues for technique;Verbal cues for precautions/safety;Verbal cues for safe use of DME/AE;Manual facilitation for weight shifting;Tactile cues for posture;Tactile cues for weight shifting Stand Pivot Transfer Details (indicate cue type and reason): Pt extremely anxious during all SPTs performed, req consistent calm verbal cues for safety and step-by-step seq Locomotion  Ambulation Ambulation: No (Unsafe/unable 2/2 level of pain) Stairs / Additional Locomotion Stairs:  No (Unsafe/unable 2/2 level of pain) Wheelchair Mobility Wheelchair Mobility: No (Pt declined t/f to w/c 2/2 fatigue despite max encouragement)  Trunk/Postural Assessment  Postural Control Postural Control: Deficits on evaluation Righting Reactions: Significantly decreased 2/2 pain and  anxiousness during standing  Balance Balance Balance Assessed: Yes Static Sitting Balance Static Sitting - Balance Support: Feet supported;Bilateral upper extremity supported Static Sitting - Level of Assistance: 5: Stand by assistance Dynamic Sitting Balance Dynamic Sitting - Balance Support: Right upper extremity supported;Left upper extremity supported Dynamic Sitting - Level of Assistance: 4: Min assist;5: Stand by assistance Dynamic Sitting - Balance Activities: Forward lean/weight shifting;Lateral lean/weight shifting Dynamic Sitting - Comments: while seated on BSC Static Standing Balance Static Standing - Balance Support: Bilateral upper extremity supported Static Standing - Level of Assistance: 2: Max assist;1: +1 Total assist Dynamic Standing Balance Dynamic Standing - Balance Support: Right upper extremity supported;Left upper extremity supported;During functional activity Dynamic Standing - Level of Assistance: 1: +2 Total assist;2: Max assist;1: +1 Total assist Dynamic Standing - Balance Activities: Lateral lean/weight shifting;Forward lean/weight shifting Dynamic Standing - Comments: Nurse tech donning new brief Extremity Assessment      RLE Assessment RLE Assessment: Exceptions to [facility] RLE Strength RLE Overall Strength Comments: Not formally assessed 2/2 pain, pt demonstrates poor functional strength LLE Assessment LLE Assessment: Exceptions to [facility] LLE Strength LLE Overall Strength Comments: Not formally assessed, pt demonstrates fair functional strength  FIM:  FIM - Bed/Chair Transfer Bed/Chair Transfer: 1: Two helpers;2: Sit > Supine: Max A (lifting assist/Pt. 25-49%) (A x2persons for SPT )   Refer to Care Plan for Long Term Goals  Recommendations for other services: None  Discharge Criteria: Patient will be discharged from PT if patient refuses treatment 3 consecutive times without medical reason, if treatment goals not met, if there is a change in medical  status, if patient makes no progress towards goals or if patient is discharged from hospital.  The above assessment, treatment plan, treatment alternatives and goals were discussed and mutually agreed upon: by patient  Denzil Hughes 08/22/2013, 2:51 PM

## 2013-08-22 NOTE — Progress Notes (Signed)
Occupational Therapy Note  Patient Details  Name: Timothy [name] MRN: [id number] Date of Birth: [birth year] Today's Date: 08/22/2013  Time: 10-10:45am ( .) Pt seen for 1:1 OT session. Pt sitting up in armchair upon arrival. Pt does not have a wheelchair yet and is limited secondary to pain. Pt refused to transfer onto [facility] due to pain (and stating he did not have to use the bathroom at that time.) Therapist suggested rehearsing transfers multiple times throughout session with pt refusing. Went over AE for bathing and dressing as pt also has hx of back pain. Also suggested elastic shoelaces for pt once his wife brings in sneakers. Pt's pain was 4-5/10 while sitting throughout session.   [name] 08/22/2013, 10:54 AM

## 2013-08-23 ENCOUNTER — Inpatient Hospital Stay (HOSPITAL_COMMUNITY): Payer: Medicare Other | Admitting: Occupational Therapy

## 2013-08-23 MED ORDER — SENNOSIDES-DOCUSATE SODIUM 8.6-50 MG PO TABS
2.0000 | ORAL_TABLET | Freq: Every day | ORAL | Status: DC
  Administered 2013-08-23 – 2013-08-24 (×2): 2 via ORAL
  Filled 2013-08-23 (×3): qty 2

## 2013-08-23 MED ORDER — POLYETHYLENE GLYCOL 3350 17 G PO PACK
17.0000 g | PACK | Freq: Every day | ORAL | Status: DC | PRN
  Administered 2013-08-24 – 2013-09-03 (×2): 17 g via ORAL
  Filled 2013-08-23 (×2): qty 1

## 2013-08-23 MED ORDER — ENOXAPARIN SODIUM 40 MG/0.4ML ~~LOC~~ SOLN
40.0000 mg | SUBCUTANEOUS | Status: DC
  Administered 2013-08-23 – 2013-08-25 (×3): 40 mg via SUBCUTANEOUS
  Filled 2013-08-23 (×4): qty 0.4

## 2013-08-23 NOTE — Progress Notes (Signed)
Occupational Therapy Session Note  Patient Details  Name: KAMARON DESKINS MRN: [id number] Date of Birth: 1928/08/19  Today's Date: 08/23/2013 Time: 1315-1350 Time Calculation (min): 35 min  Skilled Therapeutic Interventions/Progress Updates:    Patient seen this pm for 1:1 OT intervention to address functional mobility.  Patient agreeable to transferring from recliner back to bed, although states, " You'd better get another helper sweetheart.  You can't move me by yourself."   Patient required max assist to stand from recliner.  Patient with limited hip flexion forward, and extreme fear.  Patient did best with slow, calm yet encouraging approach.   Once in bed, patient indicated need to use the commode.  Stood from elevated bed with mod assist, and once standing,  pivoted to commode.  Patient hesitant to take weight through right leg, also limited by pain.  Practiced multiple transitions from sit to stand from elevated bed surface.  Patient requiring less assistance with each stand, with encouragement to weight shift trunk forward.    Therapy Documentation Precautions:  Precautions Precautions: Fall Restrictions Weight Bearing Restrictions: Yes RLE Weight Bearing: Weight bearing as tolerated   Pain:   8/10 right leg, pain medicine delivered during this session    See FIM for current functional status  Therapy/Group: Individual Therapy  Collier Salina 08/23/2013, 3:02 PM

## 2013-08-23 NOTE — IPOC Note (Signed)
Overall Plan of Care [facility]) Patient Details Name: Timothy [name] MRN: [id number] DOB: [birth year]  Admitting Diagnosis: HIP FX TRANSFUSION  Hospital Problems: Principal Problem:   Intertrochanteric fracture of right hip Active Problems:   Hypertension   Renal insufficiency   Systolic CHF, chronic   Hyponatremia   Acute blood loss anemia     Functional Problem List: Nursing Bowel;Skin Integrity;Medication Management;Pain;Motor;Endurance  PT Balance;Endurance;Pain;Safety  OT Balance;Endurance;Motor;Pain;Safety;Sensory;Skin Integrity  SLP    TR         Basic ADL's: OT Grooming;Bathing;Dressing;Toileting     Advanced  ADL's: OT Light Housekeeping     Transfers: PT Bed Mobility;Bed to Chair;Car;Furniture  OT Toilet;Tub/Shower     Locomotion: PT Stairs;Wheelchair Mobility;Ambulation     Additional Impairments: OT None  SLP        TR      Anticipated Outcomes Item Anticipated Outcome  Self Feeding independent - currently at this level  Swallowing      Basic self-care  supervision  Toileting  supervision   Bathroom Transfers supervision  Bowel/Bladder  mod I for bowel and bladder  Transfers  Supervision to Min A   Locomotion  Supervision to Min A  Communication     Cognition     Pain  Pain managed less than 5  Safety/Judgment      Therapy Plan: PT Intensity: Minimum of 1-2 x/day ,45 to 90 minutes PT Frequency: 5 out of 7 days PT Duration Estimated Length of Stay: 2-2.5 weeks OT Intensity: Minimum of 1-2 x/day, 45 to 90 minutes OT Frequency: 5 out of 7 days OT Duration/Estimated Length of Stay: 14-21 days         Team Interventions: Nursing Interventions Patient/Family Education;Medication Management;Skin Care/Wound Management;Bowel Management;Disease Management/Prevention;Pain Management;Discharge Planning  PT interventions Ambulation/gait training;DME/adaptive equipment instruction;Neuromuscular re-education;Stair training;UE/LE Strength  taining/ROM;Wheelchair propulsion/positioning;UE/LE Coordination activities;Therapeutic Activities;Pain management;Discharge planning;Balance/vestibular training;Disease management/prevention;Functional mobility training;Patient/family education;Therapeutic Exercise  OT Interventions Balance/vestibular training;Community reintegration;Discharge planning;DME/adaptive equipment instruction;Functional mobility training;Pain management;Patient/family education;Psychosocial support;Self Care/advanced ADL retraining;Skin care/wound managment;Splinting/orthotics;Therapeutic Activities;Therapeutic Exercise;UE/LE Strength taining/ROM;UE/LE Coordination activities;Wheelchair propulsion/positioning  SLP Interventions    TR Interventions    SW/CM Interventions Discharge Planning;Psychosocial Support;Patient/Family Education    Team Discharge Planning: Destination: PT-  ,OT- Home , SLP-  Projected Follow-up: PT-Home health PT, OT-  Home health OT, SLP-  Projected Equipment Needs: PT-Wheelchair (measurements);Wheelchair cushion (measurements);Rolling walker with 5" wheels, OT- 3 in 1 bedside comode;Tub/shower seat, SLP-  Patient/family involved in discharge planning: PT- Patient,  OT-Patient, SLP-   MD ELOS: 2.5 weeks Medical Rehab Prognosis:  Excellent Assessment: The patient has been admitted for CIR therapies. The team will be addressing, functional mobility, strength, stamina, balance, safety, adaptive techniques/equipment, self-care, bowel and bladder mgt, patient and caregiver education, pain mgt, ortho precautions, wound care. Goals have been set at supervision to mod I with self-care and supervision to min assist with locomotion.    Ranelle Oyster, MD, FAAPMR      See Team Conference Notes for weekly updates to the plan of care

## 2013-08-23 NOTE — Progress Notes (Signed)
Subjective/Complaints: Sore from therapy. Am scheduled dilaudid worked well A 12 point review of systems has been performed and if not noted above is otherwise negative.   Objective: Vital Signs: Blood pressure 166/74, pulse 68, temperature 98 F (36.7 C), temperature source Oral, resp. rate 16, height 5\' 10"  (1.778 m), weight 81.9 kg (180 lb 8.9 oz), SpO2 96.00%. No results found.  Recent Labs  08/21/13 0521 08/22/13 0620  WBC 6.5 8.3  HGB 7.5* 9.4*  HCT 22.2* 27.5*  PLT 141* 170    Recent Labs  08/21/13 0521 08/22/13 0620  NA 130* 130*  K 4.0 4.1  CL 96 95*  GLUCOSE 127* 112*  BUN 44* 28*  CREATININE 1.15 0.89  CALCIUM 8.6 9.2   CBG (last 3)  No results found for this basename: GLUCAP,  in the last 72 hours  Wt Readings from Last 3 Encounters:  08/23/13 81.9 kg (180 lb 8.9 oz)  08/19/13 79.9 kg (176 lb 2.4 oz)  08/19/13 79.9 kg (176 lb 2.4 oz)    Physical Exam:  General: No acute distress  Mood and affect are appropriate  Heart: Regular rate and rhythm no rubs murmurs or extra sounds  Lungs: Clear to auscultation, breathing unlabored, no rales or wheezes  Abdomen: Positive bowel sounds, soft nontender to palpation, distended  Extremities: No clubbing, cyanosis, or edema, Left distal leg wound draining serosang, R prox hi[ wound draining serosang no erythema  Skin: right hip clean, intact with minimal drainage, lac left ankle with mild serosanginous dc Neurologic: Cranial nerves II through XII intact, motor strength is 5/5 in bilateral deltoid, bicep, tricep, grip, ,5/5 Lefthip flexor KE and 1/5 RHF, knee extensors, 4/5 Bilateral ankle dorsiflexor and plantar flexor  Sensory exam normal sensation to light touch and proprioception in bilateral upper and lower extremities  Cerebellar exam normal finger to nose to finger as well as heel to shin in bilateral upper and lower extremities  Musculoskeletal: Decreased range of motion RLE. Mild L wrist  swelling   Assessment/Plan: 1. Functional deficits secondary to right intertrochanteric hip fx which require 3+ hours per day of interdisciplinary therapy in a comprehensive inpatient rehab setting. Physiatrist is providing close team supervision and 24 hour management of active medical problems listed below. Physiatrist and rehab team continue to assess barriers to discharge/monitor patient progress toward functional and medical goals. FIM: FIM - Bathing Bathing: 0: Activity did not occur  FIM - Upper Body Dressing/Undressing Upper body dressing/undressing steps patient completed: Thread/unthread right sleeve of pullover shirt/dresss;Thread/unthread left sleeve of pullover shirt/dress;Put head through opening of pull over shirt/dress;Pull shirt over trunk Upper body dressing/undressing: 4: Steadying assist FIM - Lower Body Dressing/Undressing Lower body dressing/undressing: 1: Two helpers  FIM - Toileting Toileting: 1: Two helpers  FIM - Diplomatic Services operational officer Devices: Psychiatrist Transfers: 1-Two helpers  FIM - Games developer Transfer: 4: Supine > Sit: Min A (steadying Pt. > 75%/lift 1 leg);2: Sit > Supine: Max A (lifting assist/Pt. 25-49%);1: Two helpers  FIM - Locomotion: Printmaker: Wheelchair: 5: Travels 150 ft or more: maneuvers on rugs and over door sills with supervision, cueing or coaxing FIM - Locomotion: Ambulation Locomotion: Ambulation: 0: Activity did not occur  Comprehension Comprehension Mode: Auditory Comprehension: 6-Follows complex conversation/direction: With extra time/assistive device  Expression Expression Mode: Verbal Expression: 6-Expresses complex ideas: With extra time/assistive device  Social Interaction Social Interaction: 6-Interacts appropriately with others with medication or extra time (anti-anxiety, antidepressant).  Problem Solving Problem Solving: 6-Solves  complex problems: With  extra time  Memory Memory: 6-More than reasonable amt of time  Medical Problem List and Plan:  1. DVT Prophylaxis/Anticoagulation: Mechanical: Sequential compression devices, below knee Bilateral lower extremities  2. Pain Management: Dilaudid po on chronically---will schedule 6mg  qam, continue q6prn 3. Mood: Monitor mood, pt is discouraged  4. Neuropsych: This patient is capable of making decisions on his own behalf.  5. Multiple myeloma, f/u Dr Darnelle Catalan as outpt.  6. ABLA, S/P transfusion, monitor Hgb  7. HTN: Zestril, coreg  8. Renal insufficiency-bun,cr improving---recheck monday 9. BPH Flomax , voiding, Check PVR 10. Constipation--scheduled senokot s  LOS (Days) 2 A FACE TO FACE EVALUATION WAS PERFORMED  Timothy [name] T 08/23/2013 9:38 AM

## 2013-08-24 ENCOUNTER — Inpatient Hospital Stay (HOSPITAL_COMMUNITY): Payer: Medicare Other | Admitting: *Deleted

## 2013-08-24 ENCOUNTER — Inpatient Hospital Stay (HOSPITAL_COMMUNITY): Payer: Medicare Other | Admitting: Physical Therapy

## 2013-08-24 ENCOUNTER — Inpatient Hospital Stay (HOSPITAL_COMMUNITY): Payer: Medicare Other | Admitting: Occupational Therapy

## 2013-08-24 NOTE — Progress Notes (Signed)
Occupational Therapy Session Note  Patient Details  Name: Timothy [name] MRN: [id number] Date of Birth: Jul 30, 1928  Today's Date: 08/24/2013 Time: 4098-1191 Time Calculation (min): 60 min  Short Term Goals: Week 1:  OT Short Term Goal 1 (Week 1): Patient will perform sit<>stands with moderate assistance in order to complete LB ADLs more independently OT Short Term Goal 2 (Week 1): Patient will perform UB/LB bathing with moderate assistance OT Short Term Goal 3 (Week 1): Patient will perform LB dressing with moderate assistance using AE prn OT Short Term Goal 4 (Week 1): Patient will perform toilet transfer with moderate assistance OT Short Term Goal 5 (Week 1): Patient will be educated on an UE HEP to improve overall strength & endurance in order to increase overall independence with functional mobility/transfers and ADLs  Skilled Therapeutic Interventions/Progress Updates: Patient scheduled for late am ADL but before this clinician could get to his session, he c/o pain and asked nursing to assist him back to bed. Though wincing with pain, he agreed to brush his teeth sitting in w/c while he waited for pain med administration. Upon arrival patient c/o 8/10 right hip and low back pain and strongly insisted on getting into bed because "I have sit here for so long and am hurting and have to lay down."   RN giving pain meds when this clinician arrived for therapy.  RN and this clinician assisted patient with w/c to bed transfers with Total A x2.  Once in bed, patient required assist to position right leg, which he guarded due to his c/os of pain.  After settled in bed, patient agreed to wash periarea and attempted buttocks but stated the movement hurt his right hip and low back too much.  Patient stated that he hurt too much to do any further bathing or dressing and elected to keep on the gown he slept in.   As well, he refused ted hose.    By end of session, patient stated his pain had decreased to  5/10.  He was left with table, call bell, phones and alarm in place.    Therapy Documentation Precautions:  Precautions Precautions: Fall Restrictions Weight Bearing Restrictions: Yes RLE Weight Bearing: Weight bearing as tolerated   Pain: 8/10  Right hip and low back "from sitting in this same position too long"  See FIM for current functional status  Therapy/Group: Individual Therapy  Bud Face [facility] 08/24/2013, 12:43 PM

## 2013-08-24 NOTE — Progress Notes (Signed)
Physical Therapy Session Note  Patient Details  Name: Timothy [name] MRN: [id number] Date of Birth: 05-23-1928  Today's Date: 08/24/2013 Time: 1510-1540 Time Calculation (min): 30 min    Skilled Therapeutic Interventions/Progress Updates:  Patient resting in bed, states his pain is 9/10, when asked if he needs pain medicine patient states he has just received it few minutes ago, confirmed with nursing.  Exercises in supine SLR 2 x 10 ,Knee extension, ankle pumps. Supine to sit transfer with max A. Sitting EOb with min A. Sit to stand 3 x with max A and manual facilitation of WB.Patient returned to supine with max A, able to scoot up in supine with mod A. Patient left in bed with alarm on all needs within reach.   Therapy Documentation Precautions:  Precautions Precautions: Fall Restrictions Weight Bearing Restrictions: Yes RLE Weight Bearing: Weight bearing as tolerated General: Amount of Missed PT Time (min): 30 Minutes Missed Time Reason: Pain  See FIM for current functional status  Therapy/Group: Individual Therapy  Dorna Mai 08/24/2013, 3:40 PM

## 2013-08-24 NOTE — Progress Notes (Signed)
Physical Therapy Note  Patient Details  Name: Timothy [name] MRN: [id number] Date of Birth: [birth year] Today's Date: 08/24/2013  7829-5621 (55 minutes) individual Pain: 5/10 RT hip (increased with movement)/ premedicated Focus of treatment: Therapeutic exercise focused on AROM/ strengthening RT LE; bed mobility training; transfer training; gait training; wc mobility training for activity tolerance Treatment: Pt in bed upon arrival and states that he was given his pain meds approximately 1 hr ago; Therapeutic exercise in supine X 20- bilateral ankle pumps, bilateral quad sets, AA RT hip flexion/extension (active on left);partial  rolling side to side with pillow between knees mod assist with max tactile cues for use of bedrails (to donn shorts); supine to sit mod/ max assist trunk/ RT LE ; sit to stand to RW max assist with posterior lean; gait 6 feet RW mod assist for balance (WBAT RT LE + wrist brace on left); wc mobility 200 feet on level surfaces SBA with increased left bias (left handed); pt in wc for next therapy.   1300-1315 (15 minutes) individual ( missed 30 minutes secondary to pain RT hip/ premedicated) Pain: 8/10 RT hip / premedicated Focus of treatment: Therapeutic exercise focused on AROM/ strengthening RT hip Treatment: Pt in bed upon arrival and requests to cancel this PT and remain in bed secondary to pain RT hip; pt willing to perform RT hip exercises- ankle pumps X 20 ; RT hip flexion / extension using sheet to self assist 3 X 10.    [name],JIM 08/24/2013, 9:51 AM

## 2013-08-24 NOTE — Progress Notes (Signed)
Subjective/Complaints: No new specific complaints. Right hip still quite sore. A 12 point review of systems has been performed and if not noted above is otherwise negative.   Objective: Vital Signs: Blood pressure 148/75, pulse 70, temperature 98.1 F (36.7 C), temperature source Oral, resp. rate 17, height 5\' 10"  (1.778 m), weight 81.9 kg (180 lb 8.9 oz), SpO2 95.00%. No results found.  Recent Labs  08/22/13 0620  WBC 8.3  HGB 9.4*  HCT 27.5*  PLT 170    Recent Labs  08/22/13 0620  NA 130*  K 4.1  CL 95*  GLUCOSE 112*  BUN 28*  CREATININE 0.89  CALCIUM 9.2   CBG (last 3)  No results found for this basename: GLUCAP,  in the last 72 hours  Wt Readings from Last 3 Encounters:  08/23/13 81.9 kg (180 lb 8.9 oz)  08/19/13 79.9 kg (176 lb 2.4 oz)  08/19/13 79.9 kg (176 lb 2.4 oz)    Physical Exam:  General: No acute distress  Mood and affect are appropriate  Heart: Regular rate and rhythm no rubs murmurs or extra sounds  Lungs: Clear to auscultation, breathing unlabored, no rales or wheezes  Abdomen: Positive bowel sounds, soft nontender to palpation, distended  Extremities: No clubbing, cyanosis, or edema, Left distal leg wound draining serosang, R prox hi[ wound draining serosang no erythema  Skin: right hip clean, intact with serosanginous drainage, lac left ankle with no dc Neurologic: Cranial nerves II through XII intact, motor strength is 5/5 in bilateral deltoid, bicep, tricep, grip, ,5/5 Lefthip flexor KE and 1/5 RHF, knee extensors, 4/5 Bilateral ankle dorsiflexor and plantar flexor  Sensory exam normal sensation to light touch and proprioception in bilateral upper and lower extremities  Cerebellar exam normal finger to nose to finger as well as heel to shin in bilateral upper and lower extremities  Musculoskeletal: Decreased range of motion RLE. Mild L wrist swelling   Assessment/Plan: 1. Functional deficits secondary to right intertrochanteric hip fx which  require 3+ hours per day of interdisciplinary therapy in a comprehensive inpatient rehab setting. Physiatrist is providing close team supervision and 24 hour management of active medical problems listed below. Physiatrist and rehab team continue to assess barriers to discharge/monitor patient progress toward functional and medical goals. FIM: FIM - Bathing Bathing: 0: Activity did not occur  FIM - Upper Body Dressing/Undressing Upper body dressing/undressing steps patient completed: Thread/unthread right sleeve of pullover shirt/dresss;Thread/unthread left sleeve of pullover shirt/dress;Put head through opening of pull over shirt/dress;Pull shirt over trunk Upper body dressing/undressing: 4: Steadying assist FIM - Lower Body Dressing/Undressing Lower body dressing/undressing: 1: Two helpers  FIM - Toileting Toileting: 1: Two helpers  FIM - Diplomatic Services operational officer Devices: Psychiatrist Transfers: 1-Two helpers  FIM - Games developer Transfer: 4: Supine > Sit: Min A (steadying Pt. > 75%/lift 1 leg);2: Sit > Supine: Max A (lifting assist/Pt. 25-49%);1: Two helpers  FIM - Locomotion: Printmaker: Wheelchair: 5: Travels 150 ft or more: maneuvers on rugs and over door sills with supervision, cueing or coaxing FIM - Locomotion: Ambulation Locomotion: Ambulation: 0: Activity did not occur  Comprehension Comprehension Mode: Auditory Comprehension: 6-Follows complex conversation/direction: With extra time/assistive device  Expression Expression Mode: Verbal Expression: 6-Expresses complex ideas: With extra time/assistive device  Social Interaction Social Interaction: 6-Interacts appropriately with others with medication or extra time (anti-anxiety, antidepressant).  Problem Solving Problem Solving: 6-Solves complex problems: With extra time  Memory Memory: 6-More than reasonable amt of time  Medical Problem List and Plan:  1.  DVT Prophylaxis/Anticoagulation: Mechanical: Sequential compression devices, below knee Bilateral lower extremities  2. Pain Management: Dilaudid po on chronically---will schedule 6mg  qam, continue q6prn 3. Mood: Monitor mood, pt is discouraged  4. Neuropsych: This patient is capable of making decisions on his own behalf.  5. Multiple myeloma, f/u Dr Darnelle Catalan as outpt.  6. ABLA, S/P transfusion, monitor Hgb  7. HTN: Zestril, coreg  8. Renal insufficiency-bun,cr improving---recheck monday 9. BPH Flomax , voiding, Check PVR 10. Constipation--scheduled senokot s  LOS (Days) 3 A FACE TO FACE EVALUATION WAS PERFORMED  Timothy [name] T 08/24/2013 8:33 AM

## 2013-08-25 ENCOUNTER — Inpatient Hospital Stay (HOSPITAL_COMMUNITY): Payer: Medicare Other | Admitting: Physical Therapy

## 2013-08-25 ENCOUNTER — Inpatient Hospital Stay (HOSPITAL_COMMUNITY): Payer: Medicare Other

## 2013-08-25 ENCOUNTER — Inpatient Hospital Stay (HOSPITAL_COMMUNITY): Payer: Medicare Other | Admitting: Occupational Therapy

## 2013-08-25 DIAGNOSIS — S72143A Displaced intertrochanteric fracture of unspecified femur, initial encounter for closed fracture: Secondary | ICD-10-CM

## 2013-08-25 MED ORDER — SORBITOL 70 % SOLN
45.0000 mL | Freq: Once | Status: DC
  Filled 2013-08-25: qty 60

## 2013-08-25 NOTE — Progress Notes (Signed)
Occupational Therapy Session Note  Patient Details  Name: Timothy [name] MRN: [id number] Date of Birth: [birth year]  Today's Date: 08/25/2013 Time: 4098-1191 Time Calculation (min): 55 min  Short Term Goals: Week 1:  OT Short Term Goal 1 (Week 1): Patient will perform sit<>stands with moderate assistance in order to complete LB ADLs more independently OT Short Term Goal 2 (Week 1): Patient will perform UB/LB bathing with moderate assistance OT Short Term Goal 3 (Week 1): Patient will perform LB dressing with moderate assistance using AE prn OT Short Term Goal 4 (Week 1): Patient will perform toilet transfer with moderate assistance OT Short Term Goal 5 (Week 1): Patient will be educated on an UE HEP to improve overall strength & endurance in order to increase overall independence with functional mobility/transfers and ADLs  Skilled Therapeutic Interventions/Progress Updates:  Patient found supine in bed with complaints of being sore; pain medicine given earlier this am per patient report. Dr present at beginning of session. Patient attempted to engage in bed mobility, therapist explaining the safest and easiest techniques to have success and patient tried his own way, unsuccessfully. Therapist therefore assisted patient back to a straight supine position and again encouraged patient to complete bed mobility with correct techniques while facilitating correct body position movements; patient was successful and able to complete bed mobility with moderate assistance (therapist assisting with BLEs). From here, therapist assisted patient with EOB positioning in preparation to transfer into w/c; stood using rolling walker with maximal assistance for transfer. Patient then engaged in ADL retraining at sink side. Patient sat in w/c  to complete grooming tasks and UB tasks. Patient required total assist for LB dressing and stood with moderate assistance from w/c to pull pants up to waist. At end of session,  left patient seated in w/c with breakfast tray, call bell & phone within reach; RN present in room.   Precautions:  Precautions Precautions: Fall Restrictions Weight Bearing Restrictions: Yes RLE Weight Bearing: Weight bearing as tolerated  See FIM for current functional status  Therapy/Group: Individual Therapy  [name] 08/25/2013, 8:33 AM

## 2013-08-25 NOTE — Progress Notes (Signed)
Physical Therapy Session Note  Patient Details  Name: Timothy [name] MRN: [id number] Date of Birth: 05/02/1928  Today's Date: 08/25/2013 Time: 7846-9629 Time Calculation (min): 55 min  Short Term Goals: Week 1:  PT Short Term Goal 1 (Week 1): Pt to perform bed mobility w/ mod A x1person PT Short Term Goal 2 (Week 1): Pt to perform transfers w/ mod A x1person PT Short Term Goal 3 (Week 1): Pt to amb 10' w/ RW and max A x1person PT Short Term Goal 4 (Week 1): Pt to maintain standing balance w/ RW and mod A x1person  Skilled Therapeutic Interventions/Progress Updates:    Session focused on w/c mobility for UE strength and endurance, stand step transfers with focus on sit to stand technique (cues for hand placement and anterior weightshift) from w/c, mat and toilet, and seated LE therex for ROM and strengthening. Pt required AAROM for RLE therex. Extra time needed for standing transfers as pt very limited in WB on RLE making it difficult to advance LE's. Pt unable to have BM despite suppository at this time and request to lay down due to pain. Encouraged by therapist and RN for pt to call for A to get up to [facility] if urge arises. Required Max A to return to bed and to return to supine position.   Therapy Documentation Precautions:  Precautions Precautions: Fall Restrictions Weight Bearing Restrictions: Yes RLE Weight Bearing: Weight bearing as tolerated  Pain: Premedicated for R hip pain. Repositioned and rest breaks as needed throughout session.  See FIM for current functional status  Therapy/Group: Individual Therapy  [name] [facility] 08/25/2013, 10:50 AM

## 2013-08-25 NOTE — Progress Notes (Signed)
Physical Therapy Note  Patient Details  Name: Timothy [name] MRN: [id number] Date of Birth: 1928-10-28 Today's Date: 08/25/2013  1340-1420 (40 minutes) individual Pain: 8/10 RT hip during functional mobility activities/ premedicated Focus of treatment: bed mobility training; sit to stand ; standing tolerance Treatment: Pt in bed upon arrival; pt reports that he was incontinent of stool; supine to sit max assist +1 for assist with LEs , trunk ( decreased forward trunk flexion) ; sit to stand from raised bed mod/max assist with posterior lean; transfer RW mod assist for balance (WBAT RT LE) secondary to posterior lean; sit to stand to sink X 3 for hygiene (max assist) and donning shorts (max assist); pt stood for approximately 3 -4 minutes per trial. Wife present and assisted with hygiene.    Janzen Sacks,JIM 08/25/2013, 2:20 PM

## 2013-08-25 NOTE — Progress Notes (Signed)
Physical Therapy Note  Patient Details  Name: Timothy [name] MRN: [id number] Date of Birth: 10/29/1928 Today's Date: 08/25/2013  Pt missed 30 min of skilled PT due to refusal to participate due to pain and being "too worn out from this morning". Pt reports he still has not been able to go to the bathroom, but feels like he just has gas. Declined therapy at bed level as well, stating he may be able to try again this afternoon. RN notified for pain medication.   [name] [facility] 08/25/2013, 11:43 AM

## 2013-08-25 NOTE — Progress Notes (Signed)
Subjective/Complaints: Complains of constipation. No results yesterday. A 12 point review of systems has been performed and if not noted above is otherwise negative.   Objective: Vital Signs: Blood pressure 150/75, pulse 68, temperature 97.7 F (36.5 C), temperature source Oral, resp. rate 18, height 5\' 10"  (1.778 m), weight 83.1 kg (183 lb 3.2 oz), SpO2 97.00%. No results found. No results found for this basename: WBC, HGB, HCT, PLT,  in the last 72 hours No results found for this basename: NA, K, CL, CO, GLUCOSE, BUN, CREATININE, CALCIUM,  in the last 72 hours CBG (last 3)  No results found for this basename: GLUCAP,  in the last 72 hours  Wt Readings from Last 3 Encounters:  08/25/13 83.1 kg (183 lb 3.2 oz)  08/19/13 79.9 kg (176 lb 2.4 oz)  08/19/13 79.9 kg (176 lb 2.4 oz)    Physical Exam:  General: No acute distress  Mood and affect are appropriate  Heart: Regular rate and rhythm no rubs murmurs or extra sounds  Lungs: Clear to auscultation, breathing unlabored, no rales or wheezes  Abdomen: sluggish bowel sounds, soft nontender to palpation, distended  Extremities: No clubbing, cyanosis, or edema, Left distal leg wound draining serosang, R prox hi[ wound draining serosang no erythema  Skin: right hip clean, intact with serosanginous drainage, lac left ankle with no dc Neurologic: Cranial nerves II through XII intact, motor strength is 5/5 in bilateral deltoid, bicep, tricep, grip, ,5/5 Lefthip flexor KE and 1/5 RHF, knee extensors, 4/5 Bilateral ankle dorsiflexor and plantar flexor  Sensory exam normal sensation to light touch and proprioception in bilateral upper and lower extremities  Cerebellar exam normal finger to nose to finger as well as heel to shin in bilateral upper and lower extremities  Musculoskeletal: Decreased range of motion RLE. Mild L wrist swelling   Assessment/Plan: 1. Functional deficits secondary to right intertrochanteric hip fx which require 3+ hours  per day of interdisciplinary therapy in a comprehensive inpatient rehab setting. Physiatrist is providing close team supervision and 24 hour management of active medical problems listed below. Physiatrist and rehab team continue to assess barriers to discharge/monitor patient progress toward functional and medical goals. FIM: FIM - Bathing Bathing Steps Patient Completed: Front perineal area (elelcted to only wash perineal/buttocks & face due topain   ) Bathing: 4: Min-Patient completes 8-9 7f 10 parts or 75+ percent  FIM - Upper Body Dressing/Undressing Upper body dressing/undressing steps patient completed:  (c/0 too much to dress today) Upper body dressing/undressing: 0: Wears gown/pajamas-no public clothing FIM - Lower Body Dressing/Undressing Lower body dressing/undressing: 0: Wears gown/pajamas-no public clothing  FIM - Toileting Toileting: 0: Activity did not occur  FIM - Diplomatic Services operational officer Devices: Psychiatrist Transfers: 0-Activity did not occur  FIM - Games developer Transfer: 1: Two helpers  FIM - Locomotion: Wheelchair Locomotion: Wheelchair: 5: Travels 150 ft or more: maneuvers on rugs and over door sills with supervision, cueing or coaxing FIM - Locomotion: Ambulation Locomotion: Ambulation: 0: Activity did not occur  Comprehension Comprehension Mode: Auditory Comprehension: 6-Follows complex conversation/direction: With extra time/assistive device  Expression Expression Mode: Verbal Expression: 6-Expresses complex ideas: With extra time/assistive device  Social Interaction Social Interaction: 6-Interacts appropriately with others with medication or extra time (anti-anxiety, antidepressant).  Problem Solving Problem Solving: 6-Solves complex problems: With extra time  Memory Memory: 6-More than reasonable amt of time  Medical Problem List and Plan:  1. DVT Prophylaxis/Anticoagulation: Mechanical: Sequential  compression devices, below knee Bilateral  lower extremities  2. Pain Management: Dilaudid po on chronically---will schedule 6mg  qam, continue q6prn 3. Mood: Monitor mood, pt is discouraged  4. Neuropsych: This patient is capable of making decisions on his own behalf.  5. Multiple myeloma, f/u Dr Darnelle Catalan as outpt.  6. ABLA, S/P transfusion, monitor Hgb  7. HTN: Zestril, coreg  8. Renal insufficiency-bun,cr improving---recheck monday 9. BPH Flomax , voiding, Check PVR 10. Constipation--scheduled senokot s.  Prn miralax Suppository this afternoon if no bm.  LOS (Days) 4 A FACE TO FACE EVALUATION WAS PERFORMED  Timothy [name] T 08/25/2013 8:30 AM

## 2013-08-26 ENCOUNTER — Inpatient Hospital Stay (HOSPITAL_COMMUNITY): Payer: Medicare Other | Admitting: Rehabilitation

## 2013-08-26 ENCOUNTER — Inpatient Hospital Stay (HOSPITAL_COMMUNITY): Payer: Medicare Other

## 2013-08-26 MED ORDER — SENNOSIDES-DOCUSATE SODIUM 8.6-50 MG PO TABS
2.0000 | ORAL_TABLET | Freq: Every day | ORAL | Status: DC
  Administered 2013-08-27 – 2013-08-29 (×3): 2 via ORAL
  Filled 2013-08-26 (×3): qty 2

## 2013-08-26 MED ORDER — TAMSULOSIN HCL 0.4 MG PO CAPS
0.4000 mg | ORAL_CAPSULE | Freq: Every day | ORAL | Status: DC
  Administered 2013-08-26 – 2013-09-04 (×10): 0.4 mg via ORAL
  Filled 2013-08-26 (×11): qty 1

## 2013-08-26 MED ORDER — MUSCLE RUB 10-15 % EX CREA
TOPICAL_CREAM | CUTANEOUS | Status: DC | PRN
  Filled 2013-08-26: qty 85

## 2013-08-26 MED ORDER — ENOXAPARIN SODIUM 40 MG/0.4ML ~~LOC~~ SOLN
40.0000 mg | SUBCUTANEOUS | Status: DC
  Administered 2013-08-26 – 2013-09-04 (×10): 40 mg via SUBCUTANEOUS
  Filled 2013-08-26 (×11): qty 0.4

## 2013-08-26 MED ORDER — ATORVASTATIN CALCIUM 10 MG PO TABS
10.0000 mg | ORAL_TABLET | Freq: Every day | ORAL | Status: DC
  Administered 2013-08-26 – 2013-09-04 (×10): 10 mg via ORAL
  Filled 2013-08-26 (×11): qty 1

## 2013-08-26 NOTE — Progress Notes (Signed)
Physical Therapy Session Note  Patient Details  Name: Timothy [name] MRN: [id number] Date of Birth: 10-30-1928  Today's Date: 08/26/2013 Time: 8295-6213 Time Calculation (min): 57 min  Short Term Goals: Week 1:  PT Short Term Goal 1 (Week 1): Pt to perform bed mobility w/ mod A x1person PT Short Term Goal 2 (Week 1): Pt to perform transfers w/ mod A x1person PT Short Term Goal 3 (Week 1): Pt to amb 10' w/ RW and max A x1person PT Short Term Goal 4 (Week 1): Pt to maintain standing balance w/ RW and mod A x1person  Skilled Therapeutic Interventions/Progress Updates:    Session focused on w/c propulsion to and from therapy gym for endurance and strengthening, bed mobility on flat surface (min A for sit to supine and max A for supine to sit; max cues for technique needed and limited by pain), basic stand step transfers with RW, and supine/seated LE therex including heel slides (AAROM on RLE), SAQ, ankle pumps and LAQ (AAROM on RLE) all 10 reps each. Pt demonstrated improved sit to stand (modA) with heavy encouragement for pt to try to do as much as possible himself and also improved steps during transfer(actually able to advance RLE somewhat instead of just scooting it on the floor). Pt continues to be limited by pain with mobility.  Therapy Documentation Precautions:  Precautions Precautions: Fall Restrictions Weight Bearing Restrictions: Yes RLE Weight Bearing: Weight bearing as tolerated  Pain: Premedicated for pain - increased with mobility and movement in RLE (reports pain in LLE as well). Rest breaks as needed.  See FIM for current functional status  Therapy/Group: Individual Therapy  [name] [facility] 08/26/2013, 9:31 AM

## 2013-08-26 NOTE — Progress Notes (Signed)
Subjective/Complaints: Complains of left thigh soreness and pain---feels it's after he stretched too much the other day.. A 12 point review of systems has been performed and if not noted above is otherwise negative.   Objective: Vital Signs: Blood pressure 129/69, pulse 67, temperature 98 F (36.7 C), temperature source Oral, resp. rate 17, height 5\' 10"  (1.778 m), weight 85.9 kg (189 lb 6 oz), SpO2 96.00%. No results found. No results found for this basename: WBC, HGB, HCT, PLT,  in the last 72 hours No results found for this basename: NA, K, CL, CO, GLUCOSE, BUN, CREATININE, CALCIUM,  in the last 72 hours CBG (last 3)  No results found for this basename: GLUCAP,  in the last 72 hours  Wt Readings from Last 3 Encounters:  08/26/13 85.9 kg (189 lb 6 oz)  08/19/13 79.9 kg (176 lb 2.4 oz)  08/19/13 79.9 kg (176 lb 2.4 oz)    Physical Exam:  General: No acute distress  Mood and affect are appropriate  Heart: Regular rate and rhythm no rubs murmurs or extra sounds  Lungs: Clear to auscultation, breathing unlabored, no rales or wheezes  Abdomen: sluggish bowel sounds, soft nontender to palpation, distended  Extremities: No clubbing, cyanosis, or edema, Left distal leg wound draining serosang, R prox hi[ wound draining serosang no erythema  Skin: right hip clean, intact with serosanginous drainage, lac left ankle with no dc Neurologic: Cranial nerves II through XII intact, motor strength is 5/5 in bilateral deltoid, bicep, tricep, grip, ,5/5 Lefthip flexor KE and 1/5 RHF, knee extensors, 4/5 Bilateral ankle dorsiflexor and plantar flexor  Sensory exam normal sensation to light touch and proprioception in bilateral upper and lower extremities  Cerebellar exam normal finger to nose to finger as well as heel to shin in bilateral upper and lower extremities  Musculoskeletal: Decreased range of motion RLE. Left quad, anterior thigh sore with palpation and flexion of knee, hip  flexion.   Assessment/Plan: 1. Functional deficits secondary to right intertrochanteric hip fx which require 3+ hours per day of interdisciplinary therapy in a comprehensive inpatient rehab setting. Physiatrist is providing close team supervision and 24 hour management of active medical problems listed below. Physiatrist and rehab team continue to assess barriers to discharge/monitor patient progress toward functional and medical goals. FIM: FIM - Bathing Bathing Steps Patient Completed: Chest;Right Arm;Left Arm;Abdomen;Front perineal area;Buttocks Bathing: 3: Mod-Patient completes 5-7 3f 10 parts or 50-74%  FIM - Upper Body Dressing/Undressing Upper body dressing/undressing steps patient completed: Thread/unthread right sleeve of pullover shirt/dresss;Thread/unthread left sleeve of pullover shirt/dress;Put head through opening of pull over shirt/dress;Pull shirt over trunk Upper body dressing/undressing: 5: Set-up assist to: Obtain clothing/put away FIM - Lower Body Dressing/Undressing Lower body dressing/undressing: 1: Total-Patient completed less than 25% of tasks  FIM - Toileting Toileting: 1: Total-Patient completed zero steps, helper did all 3  FIM - Diplomatic Services operational officer Devices: Psychiatrist Transfers: 0-Activity did not occur  FIM - Banker Devices: Bed rails;HOB elevated;Walker Bed/Chair Transfer: 2: Supine > Sit: Max A (lifting assist/Pt. 25-49%);2: Bed > Chair or W/C: Max A (lift and lower assist)  FIM - Locomotion: Wheelchair Locomotion: Wheelchair: 5: Travels 150 ft or more: maneuvers on rugs and over door sills with supervision, cueing or coaxing FIM - Locomotion: Ambulation Locomotion: Ambulation: 0: Activity did not occur  Comprehension Comprehension Mode: Auditory Comprehension: 6-Follows complex conversation/direction: With extra time/assistive device  Expression Expression Mode:  Verbal Expression: 6-Expresses complex ideas: With extra  time/assistive device  Social Interaction Social Interaction: 6-Interacts appropriately with others with medication or extra time (anti-anxiety, antidepressant).  Problem Solving Problem Solving: 6-Solves complex problems: With extra time  Memory Memory: 6-More than reasonable amt of time  Medical Problem List and Plan:  1. DVT Prophylaxis/Anticoagulation: Mechanical: Sequential compression devices, below knee Bilateral lower extremities  2. Pain Management: Dilaudid po on chronically---q6am scheduled, continue q6prn  -add kpad, muscle rub for left thigh---it could have a radicular component as well---continue to follow 3. Mood: Monitor mood, pt is discouraged  4. Neuropsych: This patient is capable of making decisions on his own behalf.  5. Multiple myeloma, f/u Dr Darnelle Catalan as outpt.  6. ABLA, S/P transfusion, monitor Hgb  7. HTN: Zestril, coreg  8. Renal insufficiency-bun,cr improving  9. BPH Flomax , voiding,  10. Constipation--scheduled senokot s.  Prn miralax Suppository this afternoon if no bm.  LOS (Days) 5 A FACE TO FACE EVALUATION WAS PERFORMED  [name],[name] 08/26/2013 8:23 AM

## 2013-08-26 NOTE — Progress Notes (Signed)
Physical Therapy Session Note  Patient Details  Name: Timothy [name] MRN: [id number] Date of Birth: Sep 14, 1928  Today's Date: 08/26/2013 Time: 1300-1330 Time Calculation (min): 30 min  Short Term Goals: Week 1:  PT Short Term Goal 1 (Week 1): Pt to perform bed mobility w/ mod A x1person PT Short Term Goal 2 (Week 1): Pt to perform transfers w/ mod A x1person PT Short Term Goal 3 (Week 1): Pt to amb 10' w/ RW and max A x1person PT Short Term Goal 4 (Week 1): Pt to maintain standing balance w/ RW and mod A x1person  Skilled Therapeutic Interventions/Progress Updates:    Session focused on bed mobility, transfers, overall endurance, and w/c propulsion. Pt requires significant encouragement to attempt to do things for himself (wife present and encouraging pt to try to be more independent) especially with bed mobility. Max A for sit to stand with mod cues for anterior weightshift as pt continuing to push posterior and min to mod A for actual steps during transfer. Self propelled w/c on unit and over carpeted surface for home environment w/c mobility and overall endurance/strengthening.  Therapy Documentation Precautions:  Precautions Precautions: Fall Restrictions Weight Bearing Restrictions: Yes RLE Weight Bearing: Weight bearing as tolerated  Pain: Reports pain in BLE with mobility; rest breaks as needed.  See FIM for current functional status  Therapy/Group: Individual Therapy  [name] [facility] 08/26/2013, 2:03 PM

## 2013-08-26 NOTE — Patient Care Conference (Signed)
Inpatient RehabilitationTeam Conference and Plan of Care Update Date: 08/26/2013   Time: 2:05 PM    Patient Name: Timothy [name]      Medical Record Number: [id number]  Date of Birth: 1928/03/13 Sex: Male         Room/Bed: 4W23C/4W23C-01 Payor Info: Payor: MEDICARE / Plan: MEDICARE PART A AND B / Product Type: *No Product type* /    Admitting Diagnosis: HIP FX TRANSFUSION  Admit Date/Time:  08/21/2013  4:34 PM Admission Comments: No comment available   Primary Diagnosis:  Intertrochanteric fracture of right hip Principal Problem: Intertrochanteric fracture of right hip  Patient Active Problem List   Diagnosis Date Noted  . Acute blood loss anemia 08/19/2013  . Intertrochanteric fracture of right hip 08/16/2013  . Acute on chronic systolic congestive heart failure 03/10/2012  . Hypokalemia 03/10/2012  . Osteomyelitis of left foot 03/06/2012  . Open wound of left foot 03/06/2012  . Foot ulcer, left 01/10/2012  . Hyponatremia 01/10/2012  . Systolic CHF, chronic 11/08/2011  . CAD (coronary artery disease) 11/08/2011  . S/P CABG (coronary artery bypass graft) 11/08/2011  . Anemia 11/06/2011  . Myeloma 09/12/2011  . Ischemic cardiomyopathy 04/21/2011  . Multiple myeloma 04/21/2011  . Shortness of breath dyspnea 04/21/2011  . Hypertension 04/21/2011  . Renal insufficiency 04/21/2011    Expected Discharge Date: Expected Discharge Date: 09/09/13  Team Members Present: Physician leading conference: Dr. Faith Rogue Social Worker Present: Amada Jupiter, LCSW Nurse Present: Daryll Brod, RN PT Present: [name], Jerrye Bushy, PT OT Present: Edwin Cap, OT SLP Present: Feliberto Gottron, SLP PPS Coordinator present : Tora Duck, RN, CRRN     Current Status/Progress Goal Weekly Team Focus  Medical   right hip fx after fall, chronic pain  improve activity tolerance  pain mgt, motivation, bowel issues   Bowel/Bladder   Continent of bowel and bladder. LBM 08/25/13  after suppository  Pt to remain continent of bowel and bladder  Monitor for regular bowel movement   Swallow/Nutrition/ Hydration             ADL's   min assist for UB ADLs, total assit for LB ADLs, max assist for transfers, mod-max for bed mobility  overall supervision  ADL retraining, sit<>stands, dynamic standing, functional mobility/ambulation/transfers, activity tolerance/endurance,pain management   Mobility   max A for sit to stand/transfers and bed mobility, very limited gait (mod A) due to pain, S w/c propulsion  overall S for transfers, gait; mod I w/c mobility; min A stairs and car  increasing activity tolerance, sit to stands, functional strengthening/ROM, gait training, pain management   Communication             Safety/Cognition/ Behavioral Observations            Pain   Scheduled Dilaudid 6mg  tid, Robaxin 500mg  q 6hrs prn  <3  Decrease request for prn pain medication. Monitor for effectiveness of current medication regiment   Skin   Skin tear to R elbow with tegaderm intact. R hip incisiion x 3 with staples approximated. moderate amount of old drainage noted to mid incision. Kerlix to L ankle, cdi  No additional skin breakdown. Monitor for appropriate healing q shift       Rehab Goals Patient on target to meet rehab goals: Yes *See Care Plan and progress notes for long and short-term goals.  Barriers to Discharge: pain mgt, dependent personality    Possible Resolutions to Barriers:  motivation, family support    Discharge Planning/Teaching  Needs:  home with wife to provide any needed assistance      Team Discussion:  Pt continues resistant to therapies.  Focused on pain issues.  Team feels he has the potential to reach a supervision to minimal assistance goals, however, remains currently at a max assistance level.  Concern that either goals need to be revised vs changing d/c plan to SNF.  Revisions to Treatment Plan:  None at this point   Continued Need for Acute  Rehabilitation Level of Care: The patient requires daily medical management by a physician with specialized training in physical medicine and rehabilitation for the following conditions: Daily direction of a multidisciplinary physical rehabilitation program to ensure safe treatment while eliciting the highest outcome that is of practical value to the patient.: Yes Daily medical management of patient stability for increased activity during participation in an intensive rehabilitation regime.: Yes Daily analysis of laboratory values and/or radiology reports with any subsequent need for medication adjustment of medical intervention for : Neurological problems;Post surgical problems  Dirk Vanaman 08/26/2013, 6:26 PM

## 2013-08-26 NOTE — Progress Notes (Signed)
Social Work Patient ID: Timothy [name], male   DOB: 09/11/1928, 79 y.o.   MRN: [id number]  Met with pt and wife following team conference this afternoon.  Addressed concerns of team that there has been limited progress in therapies and concern that he may require more assistance than initially anticipated.  Explained that goals had initially been targeted for supervision to minimal assistance, however, they may need to be revised.  Questioned pt and wife if they feel CIR is too aggressive a rehab program and offered option of SNF.  Both aware this is an option, however, pt continues to express his concerns over his level of pain and feeling that team was not considering this in their treatment sessions.  Encouraged wife to be present for therapy sessions tomorrow to observe and discuss concerns directly with treating therapists.  Also encouraged pt and wife to discuss pain management issues directly with MD.  Wife plans to be here by 8:00 in hopes to speak with MD and will remain through the morning.  I will follow up with them both at noon to review how sessions progressed.   Emmalene Kattner, LCSW

## 2013-08-26 NOTE — Progress Notes (Signed)
Physical Therapy Session Note  Patient Details  Name: Timothy [name] MRN: [id number] Date of Birth: 1928-08-22  Today's Date: 08/26/2013 Time: 1191-4782 Time Calculation (min): 42 min  Short Term Goals: Week 1:  PT Short Term Goal 1 (Week 1): Pt to perform bed mobility w/ mod A x1person PT Short Term Goal 2 (Week 1): Pt to perform transfers w/ mod A x1person PT Short Term Goal 3 (Week 1): Pt to amb 10' w/ RW and max A x1person PT Short Term Goal 4 (Week 1): Pt to maintain standing balance w/ RW and mod A x1person  Skilled Therapeutic Interventions/Progress Updates:   Pt received sitting in w/c in room and agreeable to therapy.  Pt states that he has had a busy morning and is feeling sore this morning, but is motivated to attempt standing and gait training.  Pt self propelled approx 80' (and another 29' towards room) towards gym w/ use of BUEs at supervision to min assist level to avoid obstacles in hallway.  Performed stand pivot transfer with RW at mod assist initially, however max assist for other reps of sitting due to increased pain and fatigue.  Provided cues for scooting to edge of chair prior to standing, and for increased forward weight shift to stand.  Once standing initiated, requires manual facilitation and mod cues for increased glute/quad activation for full standing position.  Performed sit <> stand x 3 reps at mod to max assist to rise and also assist with controlling descent due to increased pain.  Performed gait training x 8' x 2 reps at mod assist (+2 for chair follow for safety) with cues for sequencing/technique, increasing weight shift R (also with manual facilitation for quad activation during R stance), upright posture, and increasing UE WB to decrease antalgic gait pattern.  On second rep of gait training, pt states "I think I'm done with the walking."  Assisted back to chair and pt self propelled at distance mentioned above then provided assist remainder of distance to room.   All needs in place with visitor in room with pt.    Therapy Documentation Precautions:  Precautions Precautions: Fall Restrictions Weight Bearing Restrictions: Yes RLE Weight Bearing: Weight bearing as tolerated   Pain: Pain Assessment Pain Assessment: Faces Pain Score: 5  Faces Pain Scale: Hurts a little bit Pain Location: Hip Pain Orientation: Right Pain Descriptors / Indicators: Aching;Sore Pain Intervention(s): Medication (See eMAR)   Locomotion : Ambulation Ambulation/Gait Assistance: 3: Mod assist   See FIM for current functional status  Therapy/Group: Individual Therapy  Vista Deck 08/26/2013, 12:29 PM

## 2013-08-26 NOTE — Progress Notes (Signed)
Occupational Therapy Session Note  Patient Details  Name: Timothy [name] MRN: [id number] Date of Birth: 1928-09-08  Today's Date: 08/26/2013 Time: 0700-0755 Time Calculation (min): 55 min  Short Term Goals: Week 1:  OT Short Term Goal 1 (Week 1): Patient will perform sit<>stands with moderate assistance in order to complete LB ADLs more independently OT Short Term Goal 2 (Week 1): Patient will perform UB/LB bathing with moderate assistance OT Short Term Goal 3 (Week 1): Patient will perform LB dressing with moderate assistance using AE prn OT Short Term Goal 4 (Week 1): Patient will perform toilet transfer with moderate assistance OT Short Term Goal 5 (Week 1): Patient will be educated on an UE HEP to improve overall strength & endurance in order to increase overall independence with functional mobility/transfers and ADLs  Skilled Therapeutic Interventions/Progress Updates:    Pt resting in bed upon arrival.  Pt informed therapist that he had "messed his pants" and needed to be cleaned.  Pt did not recall directions from therapist yesterday in regards to sitting EOB and required max verbal cues for proper technique.  Pt required max A for supine->sit and sit->stand from EOB.  Pt required mod A to amb approx 3' to sink to clean up.  Pt required tot A for toileting tasks while standing at sink.  Pt was able to complete normal bathing of front and back peri area while standing.  Pt completed bathing and dressing tasks with sit<>stand at sink.  Pt required mod A for sit->stand at sink to pull up pants.  Pt required tot A to complete LB dressing.  Therapist introduced reacher for use with LB dressing but patient needed to use BUE to lift BLE to thread pants and was unable to use reacher.  Pt remained in w/c at bedside with needs in reach and breakfast on table in front.  Therapy Documentation Precautions:  Precautions Precautions: Fall Restrictions Weight Bearing Restrictions: Yes RLE Weight  Bearing: Weight bearing as tolerated Pain: Pain Assessment Pain Assessment: 0-10 Pain Score: 6  Pain Type: Surgical pain Pain Location: Hip Pain Orientation: Right Pain Descriptors / Indicators: Sore;Aching Pain Onset: With Activity Pain Intervention(s): RN made aware;Repositioned  See FIM for current functional status  Therapy/Group: Individual Therapy  Rich Brave 08/26/2013, 7:56 AM

## 2013-08-27 ENCOUNTER — Inpatient Hospital Stay (HOSPITAL_COMMUNITY): Payer: Medicare Other | Admitting: Occupational Therapy

## 2013-08-27 ENCOUNTER — Inpatient Hospital Stay (HOSPITAL_COMMUNITY): Payer: Medicare Other

## 2013-08-27 MED ORDER — HYDROMORPHONE HCL 2 MG PO TABS
6.0000 mg | ORAL_TABLET | Freq: Two times a day (BID) | ORAL | Status: DC
  Administered 2013-08-28 – 2013-09-04 (×7): 6 mg via ORAL
  Filled 2013-08-27 (×7): qty 3

## 2013-08-27 NOTE — Progress Notes (Signed)
Occupational Therapy Session Note  Patient Details  Name: KYLAR LEONHARDT MRN: [id number] Date of Birth: [birth year]  Today's Date: 08/27/2013 Time: 4098-1191 Time Calculation (min): 40 min  Short Term Goals: Week 1:  OT Short Term Goal 1 (Week 1): Patient will perform sit<>stands with moderate assistance in order to complete LB ADLs more independently OT Short Term Goal 2 (Week 1): Patient will perform UB/LB bathing with moderate assistance OT Short Term Goal 3 (Week 1): Patient will perform LB dressing with moderate assistance using AE prn OT Short Term Goal 4 (Week 1): Patient will perform toilet transfer with moderate assistance OT Short Term Goal 5 (Week 1): Patient will be educated on an UE HEP to improve overall strength & endurance in order to increase overall independence with functional mobility/transfers and ADLs  Skilled Therapeutic Interventions/Progress Updates:  Patient found supine in bed with 5/10 complaints of pain and wife present at bed side. RN aware of pain. Patient engaged in bed mobility with moderate assistance and using better body mechanics/techniques. From here, patient donned bilateral shoes seated EOB; therapist assisted with donning and tying shoes. Patient stood with rolling walker (therapist using gait belt) for EOB-> w/c stand pivot transfer (moderate assistance). Patient propelled self from room -> therapy gym for transfer onto therapy mat with mod assist (stand pivot technique as well). While seated edge of mat, patient engaged in BLE strengthening exercises while therapist donned elastic shoe strings to bilateral shoes in order to increase independence with donning/doffing shoes. After these tasks, patient transferred back to w/c and therapist assisted patient back to room. Left patient seated in w/c with call bell & phone within reach. Wife was present for entire session and at end of session.   Precautions:  Precautions Precautions: Fall Restrictions Weight  Bearing Restrictions: Yes RLE Weight Bearing: Weight bearing as tolerated  See FIM for current functional status  Therapy/Group: Individual Therapy  Rayvin Abid 08/27/2013, 11:17 AM

## 2013-08-27 NOTE — Progress Notes (Signed)
Physical Therapy Session Note  Patient Details  Name: Timothy [name] MRN: [id number] Date of Birth: [birth year]  Today's Date: 08/27/2013 Time: 0825-0920 Time Calculation (min): 55 min  Short Term Goals: Week 1:  PT Short Term Goal 1 (Week 1): Pt to perform bed mobility w/ mod A x1person PT Short Term Goal 2 (Week 1): Pt to perform transfers w/ mod A x1person PT Short Term Goal 3 (Week 1): Pt to amb 10' w/ RW and max A x1person PT Short Term Goal 4 (Week 1): Pt to maintain standing balance w/ RW and mod A x1person  Skilled Therapeutic Interventions/Progress Updates:    Pt's wife present to observe session. Pt reports that he got a "bad report' yesterday from the social worker and trying to work harder today. Self propelled w/c down to therapy gym with S for endurance and strengthening. Focused on sit to stands (mod A today with cues for anterior weightshift) and gait training with RW x 18 x 2 with seated break. Pt with very flexed posture and maintains flexion at knees; demonstrated and gave verbal and manual cues for tightening at quad during stance phase with improved step length. Seated LE therex including LAQ with 5 second hold and marches (AAROM on RLE) for functional stengthening x 10 reps each with extra time BLE. Gait x 5' more feet at end of session but pt with report to need to use bathroom. Transferred onto toilet with max A using grab bars and then left with wife with instructions to pull call bell when ready.   Therapy Documentation Precautions:  Precautions Precautions: Fall Restrictions Weight Bearing Restrictions: Yes RLE Weight Bearing: Weight bearing as tolerated  Pain: Premedicated for pain reporting 6/10 pain in RLE.   See FIM for current functional status  Therapy/Group: Individual Therapy  [name] [facility] 08/27/2013, 9:28 AM

## 2013-08-27 NOTE — Progress Notes (Signed)
Occupational Therapy Session Note  Patient Details  Name: Timothy [name] MRN: [id number] Date of Birth: November 14, 1928  Today's Date: 08/27/2013 Time: 0700-0756 Time Calculation (min): 56 min  Short Term Goals: Week 1:  OT Short Term Goal 1 (Week 1): Patient will perform sit<>stands with moderate assistance in order to complete LB ADLs more independently OT Short Term Goal 2 (Week 1): Patient will perform UB/LB bathing with moderate assistance OT Short Term Goal 3 (Week 1): Patient will perform LB dressing with moderate assistance using AE prn OT Short Term Goal 4 (Week 1): Patient will perform toilet transfer with moderate assistance OT Short Term Goal 5 (Week 1): Patient will be educated on an UE HEP to improve overall strength & endurance in order to increase overall independence with functional mobility/transfers and ADLs  Skilled Therapeutic Interventions/Progress Updates:    Pt resting in bed upon arrival and agreeable to getting OOB and bathing/dressing w/c level at sink.  Pt requested donning socks and shoes before performing transfer to w/c.  Pt erquires extra time and max verbal cues to sit EOB with mod A.  Pt continues to exhibit posterior lean when sitting EOB. Pt required max A for sit<>stand from EOB to ambulate with RW to w/c at sink.  Pt requested to use toilet and required mod A for stand pivot transfer using grab bars.  Pt required max A for stand pivot transfer toilet to w/c.  Pt did not have a bowel movement while on toilet.  Pt stood at sink to bathe buttocks and front perineal area but required assistance for thoroughness with bathing buttocks. Pt required tot A for LB dressing although patient attempted to pull up pants with LUE. Focus on activity tolerance, bed mobility, sit<>stand, dynamic standing balance, transfers, and safety awareness.  Therapy Documentation Precautions:  Precautions Precautions: Fall Restrictions Weight Bearing Restrictions: Yes RLE Weight Bearing:  Weight bearing as tolerated Pain: Pain Assessment Pain Assessment: 0-10 Pain Score: 5   See FIM for current functional status  Therapy/Group: Individual Therapy  Rich Brave 08/27/2013, 7:59 AM

## 2013-08-27 NOTE — Progress Notes (Signed)
Physical Therapy Session Note  Patient Details  Name: SHAWNTA ZIMBELMAN MRN: [id number] Date of Birth: 02-26-1928  Today's Date: 08/27/2013 Time: 6213-0865 Time Calculation (min): 29 min  Short Term Goals: Week 1:  PT Short Term Goal 1 (Week 1): Pt to perform bed mobility w/ mod A x1person PT Short Term Goal 2 (Week 1): Pt to perform transfers w/ mod A x1person PT Short Term Goal 3 (Week 1): Pt to amb 10' w/ RW and max A x1person PT Short Term Goal 4 (Week 1): Pt to maintain standing balance w/ RW and mod A x1person  Skilled Therapeutic Interventions/Progress Updates:    Session focused on gait with RW, transfers including managing w/c parts, and bed mobility. Pt required mod A for gait x 10' with RW this PM due to increased pain causing decreased WB on RLE. Max A for sit to stands and to transfer back to bed at end of session with RW.  Therapy Documentation Precautions:  Precautions Precautions: Fall Restrictions Weight Bearing Restrictions: Yes RLE Weight Bearing: Weight bearing as tolerated  Pain: C/o pain in RLE - premedicated.  See FIM for current functional status  Therapy/Group: Individual Therapy  [name] [facility] 08/27/2013, 1:29 PM

## 2013-08-27 NOTE — Progress Notes (Signed)
Subjective/Complaints: Struggles with pain and working through it with therapies.  A 12 point review of systems has been performed and if not noted above is otherwise negative.   Objective: Vital Signs: Blood pressure 134/61, pulse 67, temperature 98.2 F (36.8 C), temperature source Oral, resp. rate 17, height 5\' 10"  (1.778 m), weight 81.149 kg (178 lb 14.4 oz), SpO2 99.00%. No results found. No results found for this basename: WBC, HGB, HCT, PLT,  in the last 72 hours No results found for this basename: NA, K, CL, CO, GLUCOSE, BUN, CREATININE, CALCIUM,  in the last 72 hours CBG (last 3)  No results found for this basename: GLUCAP,  in the last 72 hours  Wt Readings from Last 3 Encounters:  08/27/13 81.149 kg (178 lb 14.4 oz)  08/19/13 79.9 kg (176 lb 2.4 oz)  08/19/13 79.9 kg (176 lb 2.4 oz)    Physical Exam:  General: No acute distress  Mood and affect are appropriate  Heart: Regular rate and rhythm no rubs murmurs or extra sounds  Lungs: Clear to auscultation, breathing unlabored, no rales or wheezes  Abdomen: sluggish bowel sounds, soft nontender to palpation, distended  Extremities: No clubbing, cyanosis, or edema, Left distal leg wound draining serosang, R prox hi[ wound draining serosang no erythema  Skin: right hip clean, intact with serosanginous drainage, lac left ankle with no dc Neurologic: Cranial nerves II through XII intact, motor strength is 5/5 in bilateral deltoid, bicep, tricep, grip, ,5/5 Lefthip flexor KE and 1/5 RHF, knee extensors, 4/5 Bilateral ankle dorsiflexor and plantar flexor  Sensory exam normal sensation to light touch and proprioception in bilateral upper and lower extremities  Cerebellar exam normal finger to nose to finger as well as heel to shin in bilateral upper and lower extremities  Musculoskeletal: Decreased range of motion RLE. Left quad, anterior thigh sore with palpation and flexion of knee, hip flexion.   Assessment/Plan: 1. Functional  deficits secondary to right intertrochanteric hip fx which require 3+ hours per day of interdisciplinary therapy in a comprehensive inpatient rehab setting. Physiatrist is providing close team supervision and 24 hour management of active medical problems listed below. Physiatrist and rehab team continue to assess barriers to discharge/monitor patient progress toward functional and medical goals. FIM: FIM - Bathing Bathing Steps Patient Completed: Chest;Right Arm;Left Arm;Abdomen;Front perineal area;Right upper leg;Left upper leg Bathing: 3: Mod-Patient completes 5-7 20f 10 parts or 50-74%  FIM - Upper Body Dressing/Undressing Upper body dressing/undressing steps patient completed: Thread/unthread right sleeve of pullover shirt/dresss;Thread/unthread left sleeve of pullover shirt/dress;Put head through opening of pull over shirt/dress;Pull shirt over trunk Upper body dressing/undressing: 5: Set-up assist to: Obtain clothing/put away FIM - Lower Body Dressing/Undressing Lower body dressing/undressing: 1: Total-Patient completed less than 25% of tasks  FIM - Toileting Toileting: 1: Total-Patient completed zero steps, helper did all 3  FIM - Diplomatic Services operational officer Devices: Grab bars;Bedside commode Toilet Transfers: 3-To toilet/BSC: Mod A (lift or lower assist);2-From toilet/BSC: Max A (lift and lower assist)  FIM - Press photographer Assistive Devices: Bed rails;HOB elevated Bed/Chair Transfer: 3: Supine > Sit: Mod A (lifting assist/Pt. 50-74%/lift 2 legs;2: Bed > Chair or W/C: Max A (lift and lower assist)  FIM - Locomotion: Wheelchair Locomotion: Wheelchair: 5: Travels 150 ft or more: maneuvers on rugs and over door sills with supervision, cueing or coaxing FIM - Locomotion: Ambulation Locomotion: Ambulation Assistive Devices: Designer, industrial/product Ambulation/Gait Assistance: 3: Mod assist Locomotion: Ambulation: 1: Travels less than 50 ft  with moderate  assistance (Pt: 50 - 74%)  Comprehension Comprehension Mode: Auditory Comprehension: 6-Follows complex conversation/direction: With extra time/assistive device  Expression Expression Mode: Verbal Expression: 6-Expresses complex ideas: With extra time/assistive device  Social Interaction Social Interaction: 6-Interacts appropriately with others with medication or extra time (anti-anxiety, antidepressant).  Problem Solving Problem Solving: 6-Solves complex problems: With extra time  Memory Memory: 6-More than reasonable amt of time  Medical Problem List and Plan:  1. DVT Prophylaxis/Anticoagulation: Mechanical: Sequential compression devices, below knee Bilateral lower extremities  2. Pain Management: Dilaudid po on chronically---q6am and now 1200 scheduled, continue q6prn  -add kpad, muscle rub for left thigh---it could have a radicular component as well---continue to follow  -discussed with the patient the fact that he needs to work through his pain if he wants to get home from here 3. Mood: Monitor mood, pt is discouraged  4. Neuropsych: This patient is capable of making decisions on his own behalf.  5. Multiple myeloma, f/u Dr Darnelle Catalan as outpt.  6. ABLA, S/P transfusion, monitor Hgb  7. HTN: Zestril, coreg  8. Renal insufficiency-bun,cr improving  9. BPH Flomax , voiding,  10. Constipation--scheduled senokot s.  Prn miralax  LOS (Days) 6 A FACE TO FACE EVALUATION WAS PERFORMED  SWARTZ,ZACHARY T 08/27/2013 8:45 AM

## 2013-08-28 ENCOUNTER — Inpatient Hospital Stay (HOSPITAL_COMMUNITY): Payer: Medicare Other | Admitting: Occupational Therapy

## 2013-08-28 ENCOUNTER — Inpatient Hospital Stay (HOSPITAL_COMMUNITY): Payer: Medicare Other

## 2013-08-28 MED ORDER — HYDROMORPHONE HCL 2 MG PO TABS
6.0000 mg | ORAL_TABLET | Freq: Every day | ORAL | Status: DC
  Administered 2013-08-29 – 2013-08-30 (×2): 6 mg via ORAL
  Administered 2013-09-01: 2 mg via ORAL
  Administered 2013-09-02 – 2013-09-05 (×3): 6 mg via ORAL
  Filled 2013-08-28 (×7): qty 3

## 2013-08-28 MED ORDER — HYDROMORPHONE HCL 2 MG PO TABS
4.0000 mg | ORAL_TABLET | ORAL | Status: DC | PRN
  Administered 2013-08-28: 6 mg via ORAL
  Administered 2013-08-28 – 2013-08-30 (×6): 4 mg via ORAL
  Administered 2013-08-31: 2 mg via ORAL
  Administered 2013-08-31 – 2013-09-04 (×11): 6 mg via ORAL
  Filled 2013-08-28: qty 2
  Filled 2013-08-28 (×6): qty 3
  Filled 2013-08-28: qty 2
  Filled 2013-08-28: qty 3
  Filled 2013-08-28 (×2): qty 2
  Filled 2013-08-28 (×5): qty 3
  Filled 2013-08-28: qty 2
  Filled 2013-08-28: qty 3
  Filled 2013-08-28: qty 2
  Filled 2013-08-28: qty 3

## 2013-08-28 NOTE — Progress Notes (Signed)
Physical Therapy Session Note  Patient Details  Name: Timothy [name] MRN: [id number] Date of Birth: Dec 01, 1927  Today's Date: 08/28/2013 Time: 1030-1119 Time Calculation (min): 49 min  Short Term Goals: Week 1:  PT Short Term Goal 1 (Week 1): Pt to perform bed mobility w/ mod A x1person PT Short Term Goal 2 (Week 1): Pt to perform transfers w/ mod A x1person PT Short Term Goal 3 (Week 1): Pt to amb 10' w/ RW and max A x1person PT Short Term Goal 4 (Week 1): Pt to maintain standing balance w/ RW and mod A x1person  Skilled Therapeutic Interventions/Progress Updates:    Session focused on w/c propulsion and parts management, introduced standing HEP for functional strengthening (hip abduction, hip marches, hip extension and hamstring curls x 10 reps x 2 sets each), and gait trial. Pt very limited by pain this session and unable to finished session. Only able to gait x 4' with mod A before stating "i can't do anymore." Max A for sit to stand from w/c needed. Wife present to observe session and encouragement provided but pt unable to continue. Ice pack applied and RN notified of pain.  Therapy Documentation Precautions:  Precautions Precautions: Fall Restrictions Weight Bearing Restrictions: Yes RLE Weight Bearing: Weight bearing as tolerated General: Amount of Missed PT Time (min): 11 Minutes Missed Time Reason: Pain Vital Signs:   Pain: Premedicated. Pt states it's not helping too much. Ice pack applied to hip at end.  See FIM for current functional status  Therapy/Group: Individual Therapy  [name] [facility] 08/28/2013, 11:23 AM

## 2013-08-28 NOTE — Progress Notes (Signed)
Occupational Therapy Weekly Progress Note  Patient Details  Name: Timothy [name] MRN: [id number] Date of Birth: 07/27/1928  Today's Date: 08/28/2013  Patient has met 4 of 5 short term goals.  Pt has made steady progress over the past 3 days after a short period with limited progression with BALDs.  Pain continues to be a limiting factor but is better managed now and patient is able to work through his pain when completing BADLs, especially with sit<>stand.  Pt continues to require mod A with sit<>stand to complete BADLs but is now able to stand at sink without BUE support to wash his buttocks and pull up pants.  Pt requires max A for LB dressing but has incorporated the use of a reacher to thread his pants.  Pt requires assistance with donning socks and shoes.    Patient's wife has been present for some sessions and seems to encourage patient to be as independent as possible.   Patient continues to demonstrate the following deficits: increased pain, decreased independence with sit<>stands, decreased dynamic standing balance/tolerance/endurance, decreased activity tolerance/endurance, decreased activity tolerance/endurance. Therefore, patient will continue to benefit from skilled OT intervention to enhance overall performance with BADL, iADL and Reduce care partner burden.  Patient progressing toward long term goals..  Continue plan of care.  OT Short Term Goals Week 1:  OT Short Term Goal 1 (Week 1): Patient will perform sit<>stands with moderate assistance in order to complete LB ADLs more independently OT Short Term Goal 1 - Progress (Week 1): Met OT Short Term Goal 2 (Week 1): Patient will perform UB/LB bathing with moderate assistance OT Short Term Goal 2 - Progress (Week 1): Met OT Short Term Goal 3 (Week 1): Patient will perform LB dressing with moderate assistance using AE prn OT Short Term Goal 3 - Progress (Week 1): Progressing toward goal OT Short Term Goal 4 (Week 1): Patient will  perform toilet transfer with moderate assistance OT Short Term Goal 4 - Progress (Week 1): Met OT Short Term Goal 5 (Week 1): Patient will be educated on an UE HEP to improve overall strength & endurance in order to increase overall independence with functional mobility/transfers and ADLs OT Short Term Goal 5 - Progress (Week 1): Met  Week 2:  OT Short Term Goal 1 (Week 2): Pt will perform sit<>stand with min A during bathing and dressing tasks OT Short Term Goal 2 (Week 2): Pt will perform LB dressing with moderate assistance using AE PRN OT Short Term Goal 3 (Week 2): Pt will perform toilet transfer with min A OT Short Term Goal 4 (Week 2): Pt will perform toileting tasks with min A  Balance/vestibular training;Community reintegration;Discharge planning;DME/adaptive equipment instruction;Functional mobility training;Pain management;Patient/family education;Psychosocial support;Self Care/advanced ADL retraining;Skin care/wound managment;Splinting/orthotics;Therapeutic Activities;Therapeutic Exercise;UE/LE Strength taining/ROM;UE/LE Coordination activities;Wheelchair propulsion/positioning;Neuromuscular re-education   Precautions:  Precautions Precautions: Fall Restrictions Weight Bearing Restrictions: Yes RLE Weight Bearing: Weight bearing as tolerated    Carlinda Ohlson 08/28/2013, 11:03 AM

## 2013-08-28 NOTE — Progress Notes (Addendum)
Occupational Therapy Session Note  Patient Details  Name: Timothy [name] MRN: [id number] Date of Birth: 20-Apr-1928  Today's Date: 08/28/2013 Time: 0910-1000 Time Calculation (min): 50 min  Short Term Goals: Week 1:  OT Short Term Goal 1 (Week 1): Patient will perform sit<>stands with moderate assistance in order to complete LB ADLs more independently OT Short Term Goal 2 (Week 1): Patient will perform UB/LB bathing with moderate assistance OT Short Term Goal 3 (Week 1): Patient will perform LB dressing with moderate assistance using AE prn OT Short Term Goal 4 (Week 1): Patient will perform toilet transfer with moderate assistance OT Short Term Goal 5 (Week 1): Patient will be educated on an UE HEP to improve overall strength & endurance in order to increase overall independence with functional mobility/transfers and ADLs  Skilled Therapeutic Interventions/Progress Updates:    Pt sitting EOB upon arrival.  Pt performed sit<>stand from EOB and amb with RW to w/c at sink to engage in bathing and dressing w/c level at sink.  Pt continues to require mod A for sit<>stand but is able to amb with RW at min A level.  Pt stood at sink to bathe buttocks and front perineal area. Pt declined bathing feet secondary to patient had already donned [facility] and socks/shoes.  Pt used reacher to thread BLE into pants and stood to pull up pants using BUE to complete task.  Focus on activity tolerance, dynamic standing balance, transfers, sit<>stand, family education, and safety awareness.  Therapy Documentation Precautions:  Precautions Precautions: Fall Restrictions Weight Bearing Restrictions: Yes RLE Weight Bearing: Weight bearing as tolerated General: General Amount of Missed OT Time (min): 10 Minutes Vital Signs:   Pain: Pain Assessment Pain Assessment: 0-10 Pain Score: 4  Pain Type: Acute pain Pain Location: Leg Pain Orientation: Right Pain Descriptors / Indicators: Aching  See FIM for  current functional status  Therapy/Group: Individual Therapy  Rich Brave 08/28/2013, 10:02 AM

## 2013-08-28 NOTE — Progress Notes (Signed)
Subjective/Complaints: Upset with the pain medication scheduling.   A 12 point review of systems has been performed and if not noted above is otherwise negative.   Objective: Vital Signs: Blood pressure 130/58, pulse 70, temperature 98.1 F (36.7 C), temperature source Oral, resp. rate 16, height 5\' 10"  (1.778 m), weight 80.604 kg (177 lb 11.2 oz), SpO2 98.00%. No results found. No results found for this basename: WBC, HGB, HCT, PLT,  in the last 72 hours No results found for this basename: NA, K, CL, CO, GLUCOSE, BUN, CREATININE, CALCIUM,  in the last 72 hours CBG (last 3)  No results found for this basename: GLUCAP,  in the last 72 hours  Wt Readings from Last 3 Encounters:  08/28/13 80.604 kg (177 lb 11.2 oz)  08/19/13 79.9 kg (176 lb 2.4 oz)  08/19/13 79.9 kg (176 lb 2.4 oz)    Physical Exam:  General: No acute distress  Mood and affect are appropriate  Heart: Regular rate and rhythm no rubs murmurs or extra sounds  Lungs: Clear to auscultation, breathing unlabored, no rales or wheezes  Abdomen: sluggish bowel sounds, soft nontender to palpation, distended  Extremities: No clubbing, cyanosis, or edema, Left distal leg wound draining serosang, R prox hi[ wound draining serosang no erythema  Skin: right hip clean, intact without drainage, lac left ankle with no dc Neurologic: Cranial nerves II through XII intact, motor strength is 5/5 in bilateral deltoid, bicep, tricep, grip, ,5/5 Lefthip flexor KE and 1/5 RHF, knee extensors, 4/5 Bilateral ankle dorsiflexor and plantar flexor  Sensory exam normal sensation to light touch and proprioception in bilateral upper and lower extremities  Cerebellar exam normal finger to nose to finger as well as heel to shin in bilateral upper and lower extremities  Musculoskeletal: Decreased range of motion RLE. Left quad, anterior thigh sore with palpation and flexion of knee, hip flexion.   Assessment/Plan: 1. Functional deficits secondary to  right intertrochanteric hip fx which require 3+ hours per day of interdisciplinary therapy in a comprehensive inpatient rehab setting. Physiatrist is providing close team supervision and 24 hour management of active medical problems listed below. Physiatrist and rehab team continue to assess barriers to discharge/monitor patient progress toward functional and medical goals. FIM: FIM - Bathing Bathing Steps Patient Completed: Chest;Right Arm;Left Arm;Abdomen;Front perineal area;Right upper leg;Left upper leg Bathing: 3: Mod-Patient completes 5-7 73f 10 parts or 50-74%  FIM - Upper Body Dressing/Undressing Upper body dressing/undressing steps patient completed: Thread/unthread right sleeve of pullover shirt/dresss;Thread/unthread left sleeve of pullover shirt/dress;Put head through opening of pull over shirt/dress;Pull shirt over trunk Upper body dressing/undressing: 5: Set-up assist to: Obtain clothing/put away FIM - Lower Body Dressing/Undressing Lower body dressing/undressing: 1: Total-Patient completed less than 25% of tasks  FIM - Toileting Toileting steps completed by patient: Performs perineal hygiene Toileting: 2: Max-Patient completed 1 of 3 steps  FIM - Diplomatic Services operational officer Devices: Grab bars Toilet Transfers: 2-To toilet/BSC: Max A (lift and lower assist)  FIM - Banker Devices: Therapist, occupational: 2: Chair or W/C > Bed: Max A (lift and lower assist);3: Sit > Supine: Mod A (lifting assist/Pt. 50-74%/lift 2 legs)  FIM - Locomotion: Wheelchair Locomotion: Wheelchair: 5: Travels 150 ft or more: maneuvers on rugs and over door sills with supervision, cueing or coaxing FIM - Locomotion: Ambulation Locomotion: Ambulation Assistive Devices: Designer, industrial/product Ambulation/Gait Assistance: 3: Mod assist Locomotion: Ambulation: 1: Travels less than 50 ft with moderate assistance (Pt: 50 -  74%)  Comprehension Comprehension Mode: Auditory Comprehension: 6-Follows complex conversation/direction: With extra time/assistive device  Expression Expression Mode: Verbal Expression: 6-Expresses complex ideas: With extra time/assistive device  Social Interaction Social Interaction: 6-Interacts appropriately with others with medication or extra time (anti-anxiety, antidepressant).  Problem Solving Problem Solving: 6-Solves complex problems: With extra time  Memory Memory: 6-More than reasonable amt of time  Medical Problem List and Plan:  1. DVT Prophylaxis/Anticoagulation: Mechanical: Sequential compression devices, below knee Bilateral lower extremities  2. Pain Management: Dilaudid po on chronically---back to q6am  scheduled, change prn to Q4 prn  -add kpad, muscle rub for left thigh---it could have a radicular component as well---continue to follow    3. Mood: Monitor mood, pt is discouraged  4. Neuropsych: This patient is capable of making decisions on his own behalf.  5. Multiple myeloma, f/u Dr Darnelle Catalan as outpt.  6. ABLA, S/P transfusion, monitor Hgb  7. HTN: Zestril, coreg  8. Renal insufficiency-bun,cr improving  9. BPH Flomax , voiding,  10. Constipation--scheduled senokot s.  Prn miralax 11. Wound- dc staples  LOS (Days) 7 A FACE TO FACE EVALUATION WAS PERFORMED  Troye Hiemstra T 08/28/2013 8:28 AM

## 2013-08-28 NOTE — Progress Notes (Signed)
Physical Therapy Session Note  Patient Details  Name: Timothy [name] MRN: [id number] Date of Birth: [birth year]  Today's Date: 08/28/2013 Time: 1430-1500 Time Calculation (min): 30 min  Short Term Goals: Week 1:  PT Short Term Goal 1 (Week 1): Pt to perform bed mobility w/ mod A x1person PT Short Term Goal 2 (Week 1): Pt to perform transfers w/ mod A x1person PT Short Term Goal 3 (Week 1): Pt to amb 10' w/ RW and max A x1person PT Short Term Goal 4 (Week 1): Pt to maintain standing balance w/ RW and mod A x1person  Skilled Therapeutic Interventions/Progress Updates:    Per pt request, focused on standing therex initiated this morning to work on improving mobility in RLE to aid with transfers and gait. Completed 10 reps x 2 sets of standing hip abduction, hip extension, hip flexion, and hamstring curls with rest breaks as needed. Required mod A for sit to stand back to bed from w/c with RW and min A for stand step piece of transfer. Min A to return to supine due to A needed for RLE management.  Therapy Documentation Precautions:  Precautions Precautions: Fall Restrictions Weight Bearing Restrictions: Yes RLE Weight Bearing: Weight bearing as tolerated   Pain:  Reports pain is still the same "hurts whenever I try to move it" in reference to R hip. States the ice did not really help and already had medication.  See FIM for current functional status  Therapy/Group: Individual Therapy  [name] [facility] 08/28/2013, 3:39 PM

## 2013-08-28 NOTE — Progress Notes (Signed)
Occupational Therapy Session Note  Patient Details  Name: Timothy [name] MRN: [id number] Date of Birth: 1927-12-19  Today's Date: 08/28/2013  Short Term Goals: Week 1:  OT Short Term Goal 1 (Week 1): Patient will perform sit<>stands with moderate assistance in order to complete LB ADLs more independently OT Short Term Goal 2 (Week 1): Patient will perform UB/LB bathing with moderate assistance OT Short Term Goal 3 (Week 1): Patient will perform LB dressing with moderate assistance using AE prn OT Short Term Goal 4 (Week 1): Patient will perform toilet transfer with moderate assistance OT Short Term Goal 5 (Week 1): Patient will be educated on an UE HEP to improve overall strength & endurance in order to increase overall independence with functional mobility/transfers and ADLs  Skilled Therapeutic Interventions/Progress Updates:  Time: 1300-1345 - 45 Minutes Individual Therapy No complaints of pain Patient found seated in w/c. Patient propelled self from room -> ADL apartment. Patient engaged in functional ambulation in apartment, also focusing on stepping over shower stall (using blue simulated block). Patient had difficulty with this task, requiring moderate assistance to advance LLE and step over threshold. Patient also performed sit<>stand from bed in ADL apartment which was a lower surface. Patient propelled self halfway back using BLEs (quads by pushing backwards). Patient took more than a reasonable amount of time to complete these functional tasks in the ADL apartment. Therapist left patient seated in w/c beside bed with call bell & phone within reach.   Precautions:  Precautions Precautions: Fall Restrictions Weight Bearing Restrictions: Yes RLE Weight Bearing: Weight bearing as tolerated  See FIM for current functional status  Selim Durden 08/28/2013, 8:02 AM

## 2013-08-29 ENCOUNTER — Inpatient Hospital Stay (HOSPITAL_COMMUNITY): Payer: Medicare Other

## 2013-08-29 DIAGNOSIS — S72143A Displaced intertrochanteric fracture of unspecified femur, initial encounter for closed fracture: Secondary | ICD-10-CM

## 2013-08-29 MED ORDER — METHOCARBAMOL 500 MG PO TABS
500.0000 mg | ORAL_TABLET | Freq: Four times a day (QID) | ORAL | Status: DC
  Administered 2013-08-29 – 2013-09-05 (×26): 500 mg via ORAL
  Filled 2013-08-29 (×37): qty 1

## 2013-08-29 NOTE — Progress Notes (Signed)
Pt has raised area at rt ankle, red, not warm, denies tenderness at site.  Marissa Nestle [facility] aware, saw pt., monitor.   Lt. heel with dark area, both heels boggy, floated, monitor need for Prevalon boot.

## 2013-08-29 NOTE — Progress Notes (Signed)
Subjective/Complaints: Had some shooting pains last night down his legs.   A 12 point review of systems has been performed and if not noted above is otherwise negative.   Objective: Vital Signs: Blood pressure 148/75, pulse 63, temperature 98 F (36.7 C), temperature source Oral, resp. rate 18, height 5\' 10"  (1.778 m), weight 79.334 kg (174 lb 14.4 oz), SpO2 96.00%. No results found. No results found for this basename: WBC, HGB, HCT, PLT,  in the last 72 hours No results found for this basename: NA, K, CL, CO, GLUCOSE, BUN, CREATININE, CALCIUM,  in the last 72 hours CBG (last 3)  No results found for this basename: GLUCAP,  in the last 72 hours  Wt Readings from Last 3 Encounters:  08/29/13 79.334 kg (174 lb 14.4 oz)  08/19/13 79.9 kg (176 lb 2.4 oz)  08/19/13 79.9 kg (176 lb 2.4 oz)    Physical Exam:  General: No acute distress  Mood and affect are appropriate  Heart: Regular rate and rhythm no rubs murmurs or extra sounds  Lungs: Clear to auscultation, breathing unlabored, no rales or wheezes  Abdomen: sluggish bowel sounds, soft nontender to palpation, distended  Extremities: No clubbing, cyanosis, or edema, Left distal leg wound draining serosang, R prox hi[ wound draining serosang no erythema  Skin: right hip clean, intact without drainage, lac left ankle with no dc Neurologic: Cranial nerves II through XII intact, motor strength is 5/5 in bilateral deltoid, bicep, tricep, grip, ,5/5 Lefthip flexor KE and 1/5 RHF, knee extensors, 4/5 Bilateral ankle dorsiflexor and plantar flexor  Sensory exam normal sensation to light touch and proprioception in bilateral upper and lower extremities  Cerebellar exam normal finger to nose to finger as well as heel to shin in bilateral upper and lower extremities  Musculoskeletal: Decreased range of motion RLE. Left quad, anterior thigh sore with palpation and flexion of knee, hip flexion.   Assessment/Plan: 1. Functional deficits secondary to  right intertrochanteric hip fx which require 3+ hours per day of interdisciplinary therapy in a comprehensive inpatient rehab setting. Physiatrist is providing close team supervision and 24 hour management of active medical problems listed below. Physiatrist and rehab team continue to assess barriers to discharge/monitor patient progress toward functional and medical goals. FIM: FIM - Bathing Bathing Steps Patient Completed: Chest;Right Arm;Left Arm;Abdomen;Front perineal area;Buttocks;Right upper leg;Left upper leg Bathing: 4: Min-Patient completes 8-9 54f 10 parts or 75+ percent  FIM - Upper Body Dressing/Undressing Upper body dressing/undressing steps patient completed: Thread/unthread right sleeve of pullover shirt/dresss;Thread/unthread left sleeve of pullover shirt/dress;Put head through opening of pull over shirt/dress;Pull shirt over trunk Upper body dressing/undressing: 5: Set-up assist to: Obtain clothing/put away FIM - Lower Body Dressing/Undressing Lower body dressing/undressing steps patient completed: Thread/unthread right pants leg;Thread/unthread left pants leg;Pull pants up/down Lower body dressing/undressing: 2: Max-Patient completed 25-49% of tasks  FIM - Toileting Toileting steps completed by patient: Performs perineal hygiene Toileting: 2: Max-Patient completed 1 of 3 steps  FIM - Diplomatic Services operational officer Devices: Grab bars Toilet Transfers: 2-To toilet/BSC: Max A (lift and lower assist)  FIM - Banker Devices: Therapist, occupational: 1: Supine > Sit: Total A (helper does all/Pt. < 25%);3: Bed > Chair or W/C: Mod A (lift or lower assist)  FIM - Locomotion: Wheelchair Locomotion: Wheelchair: 2: Travels 50 - 149 ft with supervision, cueing or coaxing FIM - Locomotion: Ambulation Locomotion: Ambulation Assistive Devices: Designer, industrial/product Ambulation/Gait Assistance: 3: Mod assist Locomotion: Ambulation: 1:  [employer]  less than 50 ft with moderate assistance (Pt: 50 - 74%)  Comprehension Comprehension Mode: Auditory Comprehension: 6-Follows complex conversation/direction: With extra time/assistive device  Expression Expression Mode: Verbal Expression: 6-Expresses complex ideas: With extra time/assistive device  Social Interaction Social Interaction: 6-Interacts appropriately with others with medication or extra time (anti-anxiety, antidepressant).  Problem Solving Problem Solving: 6-Solves complex problems: With extra time  Memory Memory: 6-More than reasonable amt of time  Medical Problem List and Plan:  1. DVT Prophylaxis/Anticoagulation: Mechanical: Sequential compression devices, below knee Bilateral lower extremities  2. Pain Management: Dilaudid po on chronically---back to q6am  scheduled, change prn to Q4 prn  -  kpad, muscle rub for left thigh--  -likely radicular component to leg pain. He doesn't want to increase gabapentin due to cost.    3. Mood: Monitor mood, pt is discouraged  4. Neuropsych: This patient is capable of making decisions on his own behalf.  5. Multiple myeloma, f/u Dr Darnelle Catalan as outpt.  6. ABLA, S/P transfusion, monitor Hgb  7. HTN: Zestril, coreg  8. Renal insufficiency-bun,cr improving  9. BPH Flomax , voiding,  10. Constipation--scheduled senokot s.  Prn miralax 11. Wound- dc'ed staples  LOS (Days) 8 A FACE TO FACE EVALUATION WAS PERFORMED  Katheryne Gorr T 08/29/2013 8:23 AM

## 2013-08-29 NOTE — Progress Notes (Signed)
Pt with black area lt heel, unstageable pressure area; peri-wound red, blanchable.  See FS for measuremnets.  Rt heel with small blackened area, both heels boggy. Allevyn dressings and Prevalon boots obtained. Pt also has St.2 , 2nd toe lt foot ( great toe amputated) Measures 1cm x .25 cms. Marissa Nestle, PAC aware. Monitor, see above orders.

## 2013-08-29 NOTE — Progress Notes (Signed)
Occupational Therapy Session Note  Patient Details  Name: Timothy [name] MRN: [id number] Date of Birth: 10/27/1928  Today's Date: 08/29/2013 Time: 0700-0755 Time Calculation (min): 55 min  Short Term Goals: Week 2:  OT Short Term Goal 1 (Week 2): Pt will perform sit<>stand with min A during bathing and dressing tasks OT Short Term Goal 2 (Week 2): Pt will perform LB dressing with moderate assistance using AE PRN OT Short Term Goal 3 (Week 2): Pt will perform toilet transfer with min A OT Short Term Goal 4 (Week 2): Pt will perform toileting tasks with min A  Skilled Therapeutic Interventions/Progress Updates:    Pt resting in bed upon arrival and immediately began moving to EOB in preparation for standing up to walk to w/c at sink.  Pt required min A to push up from bed to upright position.  Pt required min A for sit->stand from EOB to amb with RW to w/c with steady A.  Pt completed bathing with sit<>stand from w/c at sink.  Pt declined to bathe feet this morning.  Pt performed sit<>stand at sink X 5 during therapy session requiring min A initially but completing final sit<>stand with supervision.  Pt stood at sink for approx 3 mins to bathe buttocks and bilateral upper legs.  Pt used reacher appropriately and efficiently to thread BLE into pants this morning and was able to pull pants without assistance.  Focus on activity tolerance, dynamic standing balance, transfers, bed mobility, functional amb with RW, and safety awareness. Therapy Documentation Precautions:  Precautions Precautions: Fall Restrictions Weight Bearing Restrictions: Yes RLE Weight Bearing: Weight bearing as tolerated Pain: Pain Assessment Pain Assessment: 0-10 Pain Score: 5  Pain Type: Acute pain Pain Location: Leg Pain Orientation: Right;Left Pain Descriptors / Indicators: Aching Pain Onset: On-going Pain Intervention(s): RN made aware;Repositioned  See FIM for current functional status  Therapy/Group:  Individual Therapy  Rich Brave 08/29/2013, 7:56 AM

## 2013-08-29 NOTE — Progress Notes (Signed)
Physical Therapy Session Note  Patient Details  Name: Timothy [name] MRN: [id number] Date of Birth: Mar 01, 1928  Today's Date: 08/29/2013  Short Term Goals: Week 1:  PT Short Term Goal 1 (Week 1): Pt to perform bed mobility w/ mod A x1person PT Short Term Goal 1 - Progress (Week 1): Progressing toward goal PT Short Term Goal 2 (Week 1): Pt to perform transfers w/ mod A x1person PT Short Term Goal 2 - Progress (Week 1): Met PT Short Term Goal 3 (Week 1): Pt to amb 10' w/ RW and max A x1person PT Short Term Goal 3 - Progress (Week 1): Met PT Short Term Goal 4 (Week 1): Pt to maintain standing balance w/ RW and mod A x1person PT Short Term Goal 4 - Progress (Week 1): Met  Session #2: Time: 4098-1191 Time Calculation (min):  42 min Reports 5-6/10 pain in R hip - premedicated by RN. W/c propulsion down to therapy gym for endurance and strengthening with S. Min A stand step transfer with RW onto and off of Nustep and completed 20 min on level 3 for ROM/strengthening. (significant improvement noted in RLE by end of exercise).   Session #3: Time: 1430-1440 Time Calculation: 10 min Pt presents in w/c with c/o back pain and hip pain (R) and states he had medication about a half ago and wanted to "renig on this one" due to pain. Encouraged gait training or standing activity but pt declined and just wanted to return to bed and rest. Focused on transfer training and bed mobility with pt able to complete sit to stand with S (and set up w/c with min A) with RW and steady A needed during turn due to decreased WB on RLE. Very light min A needed to return to supine and reposition. Pt missed 20 min of session due to pain.     Therapy Documentation Precautions:  Precautions Precautions: Fall Restrictions Weight Bearing Restrictions: Yes RLE Weight Bearing: Weight bearing as tolerated    See FIM for current functional status  Therapy/Group: Individual Therapy  [name] [facility] 08/29/2013,  1:22 PM

## 2013-08-29 NOTE — Progress Notes (Signed)
Physical Therapy Weekly Progress Note  Patient Details  Name: Timothy [name] MRN: [id number] Date of Birth: 03/25/1928  Today's Date: 08/30/2013  Patient has met 3 of 4 short term goals.  Pt continues to require max A at times for bed mobility (supine to sit) but can do as well as min A for sit to supine. Transfers have progressed to mod A overall with RW and at times min A. Gait has still been limited to under 20' at a time with min to mod A. Pain is main limiting factor for patient and has limited progress but participation to work through some of the pain has improved somewhat this week. Pt's wife has been present the last couple days to provide encouragement to patient.  Patient continues to demonstrate the following deficits: decreased strength, decreased ROM, decreased activity tolerance, decreased functional mobility, gait impairments, pain and therefore will continue to benefit from skilled PT intervention to enhance overall performance with activity tolerance, balance, ability to compensate for deficits and functional use of  right lower extremity.  Patient progressing toward long term goals..  Continue plan of care.  PT Short Term Goals Week 1:  PT Short Term Goal 1 (Week 1): Pt to perform bed mobility w/ mod A x1person PT Short Term Goal 1 - Progress (Week 1): Progressing toward goal PT Short Term Goal 2 (Week 1): Pt to perform transfers w/ mod A x1person PT Short Term Goal 2 - Progress (Week 1): Met PT Short Term Goal 3 (Week 1): Pt to amb 10' w/ RW and max A x1person PT Short Term Goal 3 - Progress (Week 1): Met PT Short Term Goal 4 (Week 1): Pt to maintain standing balance w/ RW and mod A x1person PT Short Term Goal 4 - Progress (Week 1): Met Week 2:  PT Short Term Goal 1 (Week 2): = LTGs  Skilled Therapeutic Interventions/Progress Updates:  Ambulation/gait training;DME/adaptive equipment instruction;Neuromuscular re-education;Stair training;UE/LE Strength  taining/ROM;Wheelchair propulsion/positioning;UE/LE Coordination activities;Therapeutic Activities;Pain management;Discharge planning;Balance/vestibular training;Disease management/prevention;Functional mobility training;Patient/family education;Therapeutic Exercise;Psychosocial support;Skin care/wound management   Therapy Documentation Precautions:  Precautions Precautions: Fall Restrictions Weight Bearing Restrictions: Yes RLE Weight Bearing: Weight bearing as tolerated   See FIM for current functional status   [name] [facility] 08/30/2013, 7:51 AM

## 2013-08-29 NOTE — Progress Notes (Signed)
Physical Therapy Session Note  Patient Details  Name: Timothy [name] MRN: [id number] Date of Birth: 08-Jul-1928  Today's Date: 08/29/2013 Time: 0830-0930 Time Calculation (min): 60 min  Short Term Goals: Week 1:  PT Short Term Goal 1 (Week 1): Pt to perform bed mobility w/ mod A x1person PT Short Term Goal 1 - Progress (Week 1): Progressing toward goal PT Short Term Goal 2 (Week 1): Pt to perform transfers w/ mod A x1person PT Short Term Goal 2 - Progress (Week 1): Progressing toward goal PT Short Term Goal 3 (Week 1): Pt to amb 10' w/ RW and max A x1person PT Short Term Goal 3 - Progress (Week 1): Met PT Short Term Goal 4 (Week 1): Pt to maintain standing balance w/ RW and mod A x1person PT Short Term Goal 4 - Progress (Week 1): Met  Skilled Therapeutic Interventions/Progress Updates:    Session focused on w/c propulsion for UE strength and endurance, gait training with RW with min A (and min A for initial sit to stand) x 15', toe taps in preparation for stair training to work on weightshifting onto RLE (mod A needed and therapist to block at knee), progressed to completing up/down 1 step with bilateral rails (mod A with therapist blocking R knee) multiple reps, and standing therex to strengthen and increased ROM on RLE including hip abduction/extension and hamstring curls. Pt needed to use bathroom; transferred onto toilet with min A with wife providing A safely. Returned to bed end of session with steady A with RW for transfer and min A to return to supine.  Therapy Documentation Precautions:  Precautions Precautions: Fall Restrictions Weight Bearing Restrictions: Yes RLE Weight Bearing: Weight bearing as tolerated  Pain: Premedicated for RLE pain.  See FIM for current functional status  Therapy/Group: Individual Therapy  [name] [facility] 08/29/2013, 9:30 AM

## 2013-08-30 ENCOUNTER — Inpatient Hospital Stay (HOSPITAL_COMMUNITY): Payer: Medicare Other

## 2013-08-30 DIAGNOSIS — N289 Disorder of kidney and ureter, unspecified: Secondary | ICD-10-CM

## 2013-08-30 DIAGNOSIS — I2510 Atherosclerotic heart disease of native coronary artery without angina pectoris: Secondary | ICD-10-CM

## 2013-08-30 DIAGNOSIS — I10 Essential (primary) hypertension: Secondary | ICD-10-CM

## 2013-08-30 MED ORDER — SENNOSIDES-DOCUSATE SODIUM 8.6-50 MG PO TABS
2.0000 | ORAL_TABLET | Freq: Every evening | ORAL | Status: DC | PRN
  Administered 2013-09-03: 2 via ORAL
  Filled 2013-08-30: qty 2

## 2013-08-30 NOTE — Progress Notes (Signed)
Timothy [name] is a 79 y.o. male 11/05/1928 [id number]  Subjective: C/o loose stools. No new problems. Slept well. Feeling OK.  Objective: Vital signs in last 24 hours: Temp:  [97.4 F (36.3 C)-97.9 F (36.6 C)] 97.4 F (36.3 C) (10/11 0554) Pulse Rate:  [69-76] 76 (10/11 0554) Resp:  [18] 18 (10/11 0554) BP: (127-153)/(67-76) 153/76 mmHg (10/11 0554) SpO2:  [98 %-99 %] 98 % (10/11 0554) Weight:  [165 lb 12.6 oz (75.2 kg)] 165 lb 12.6 oz (75.2 kg) (10/11 0554) Weight change: -9 lb 1.8 oz (-4.134 kg) Last BM Date: 08/29/13  Intake/Output from previous day: 10/10 0701 - 10/11 0700 In: 720 [P.O.:720] Out: 675 [Urine:675] Last cbgs: CBG (last 3)  No results found for this basename: GLUCAP,  in the last 72 hours   Physical Exam General: No apparent distress    HEENT: moist mucosa Lungs: Normal effort. Lungs clear to auscultation, no crackles or wheezes. Cardiovascular: Regular rate and rhythm, no edema Abdomen: S/NT/ND; BS(+) Musculoskeletal:  No change from before Neurological: No new neurological deficits Wounds: N/A    Skin: clear Alert, cooperative   Lab Results: BMET    Component Value Date/Time   NA 130* 08/22/2013 0620   NA 131* 08/13/2013 1132   K 4.1 08/22/2013 0620   K 4.7 08/13/2013 1132   CL 95* 08/22/2013 0620   CL 98 05/12/2013 1048   CO2 25 08/22/2013 0620   CO2 23 08/13/2013 1132   GLUCOSE 112* 08/22/2013 0620   GLUCOSE 124 08/13/2013 1132   GLUCOSE 85 05/12/2013 1048   BUN 28* 08/22/2013 0620   BUN 20.8 08/13/2013 1132   CREATININE 0.89 08/22/2013 0620   CREATININE 1.2 08/13/2013 1132   CALCIUM 9.2 08/22/2013 0620   CALCIUM 9.3 08/13/2013 1132   GFRNONAA 77* 08/22/2013 0620   GFRAA 90* 08/22/2013 0620   CBC    Component Value Date/Time   WBC 8.3 08/22/2013 0620   WBC 5.8 08/13/2013 1132   RBC 3.13* 08/22/2013 0620   RBC 3.31* 08/13/2013 1132   RBC 3.57* 06/28/2011 1103   HGB 9.4* 08/22/2013 0620   HGB 10.0* 08/13/2013 1132   HCT 27.5* 08/22/2013 0620   HCT  29.9* 08/13/2013 1132   PLT 170 08/22/2013 0620   PLT 167 08/13/2013 1132   MCV 87.9 08/22/2013 0620   MCV 90.4 08/13/2013 1132   MCH 30.0 08/22/2013 0620   MCH 30.3 08/13/2013 1132   MCHC 34.2 08/22/2013 0620   MCHC 33.5 08/13/2013 1132   RDW 15.7* 08/22/2013 0620   RDW 17.0* 08/13/2013 1132   LYMPHSABS 1.6 08/22/2013 0620   LYMPHSABS 1.7 08/13/2013 1132   MONOABS 0.9 08/22/2013 0620   MONOABS 0.5 08/13/2013 1132   EOSABS 0.1 08/22/2013 0620   EOSABS 0.1 08/13/2013 1132   BASOSABS 0.0 08/22/2013 0620   BASOSABS 0.1 08/13/2013 1132    Studies/Results: No results found.  Medications: I have reviewed the patient's current medications.  Assessment/Plan:   1. DVT Prophylaxis/Anticoagulation: Mechanical: Sequential compression devices, below knee Bilateral lower extremities  2. Pain Management: Dilaudid po on chronically---back to q6am scheduled, change prn to Q4 prn  - kpad, muscle rub for left thigh--  -likely radicular component to leg pain. He doesn't want to increase gabapentin due to cost.  3. Mood: Monitor mood, pt is discouraged  4. Neuropsych: This patient is capable of making decisions on his own behalf.  5. Multiple myeloma, f/u Dr Darnelle Catalan as outpt.  6. ABLA, S/P transfusion, monitor Hgb  7.  HTN: Zestril, coreg  8. Renal insufficiency-bun,cr improving  9. BPH Flomax , voiding,  10. Constipation-- now w/loose stools - senokot s. prn. And prn miralax  11. Wound- dc'ed staples    Length of stay, days: 9  Sonda Primes , MD 08/30/2013, 9:04 AM

## 2013-08-30 NOTE — Progress Notes (Signed)
Physical Therapy Session Note  Patient Details  Name: Timothy [name] MRN: [id number] Date of Birth: 12/14/1927  Today's Date: 08/30/2013 Time: 4098-1191 Time Calculation (min): 45 min   Skilled Therapeutic Interventions/Progress Updates:    Session focused on bed mobility, dressing EOB with focus on standing balance to pull up pants (min A for balance, mod A initially for sit to stand, basic transfers (progressed to min A/close S), and gait training with RW. Pt able to gait x 20' at a time through obstacle course to work on turns (x 4 reps) with improved gait pattern noted today as pt is increasing his WB through RLE. Self propelled w/c back to room for general UE strength and endurance.  Therapy Documentation Precautions:  Precautions Precautions: Fall Restrictions Weight Bearing Restrictions: Yes RLE Weight Bearing: Weight bearing as tolerated  Pain: 6/10 pain in R hip - RN administered pain medication.  See FIM for current functional status  Therapy/Group: Individual Therapy  [name] [facility] 08/30/2013, 10:52 AM

## 2013-08-31 ENCOUNTER — Inpatient Hospital Stay (HOSPITAL_COMMUNITY): Payer: Medicare Other | Admitting: *Deleted

## 2013-08-31 ENCOUNTER — Inpatient Hospital Stay (HOSPITAL_COMMUNITY): Payer: Medicare Other | Admitting: Physical Therapy

## 2013-08-31 DIAGNOSIS — S72009S Fracture of unspecified part of neck of unspecified femur, sequela: Secondary | ICD-10-CM

## 2013-08-31 DIAGNOSIS — I5022 Chronic systolic (congestive) heart failure: Secondary | ICD-10-CM

## 2013-08-31 DIAGNOSIS — I509 Heart failure, unspecified: Secondary | ICD-10-CM

## 2013-08-31 NOTE — Progress Notes (Signed)
KENETH BORG is a 79 y.o. male 07/16/1928 [id number]  Subjective: C/o loose stools - better. No new problems. Slept well. Feeling OK.  Objective: Vital signs in last 24 hours: Temp:  [98.1 F (36.7 C)-98.4 F (36.9 C)] 98.4 F (36.9 C) (10/12 0540) Pulse Rate:  [71-73] 73 (10/12 0540) Resp:  [16-18] 18 (10/12 0540) BP: (136-156)/(64-70) 156/70 mmHg (10/12 0540) SpO2:  [97 %] 97 % (10/12 0540) Weight:  [172 lb 2.9 oz (78.1 kg)] 172 lb 2.9 oz (78.1 kg) (10/12 0540) Weight change: 6 lb 6.3 oz (2.9 kg) Last BM Date: 08/30/13  Intake/Output from previous day: 10/11 0701 - 10/12 0700 In: 480 [P.O.:480] Out: 600 [Urine:600] Last cbgs: CBG (last 3)  No results found for this basename: GLUCAP,  in the last 72 hours   Physical Exam General: No apparent distress    HEENT: moist mucosa Lungs: Normal effort. Lungs clear to auscultation, no crackles or wheezes. Cardiovascular: Regular rate and rhythm, no edema Abdomen: S/NT/ND; BS(+) Musculoskeletal:  No change from before Neurological: No new neurological deficits Wounds: N/A    Skin: clear Alert, cooperative   Lab Results: BMET    Component Value Date/Time   NA 130* 08/22/2013 0620   NA 131* 08/13/2013 1132   K 4.1 08/22/2013 0620   K 4.7 08/13/2013 1132   CL 95* 08/22/2013 0620   CL 98 05/12/2013 1048   CO2 25 08/22/2013 0620   CO2 23 08/13/2013 1132   GLUCOSE 112* 08/22/2013 0620   GLUCOSE 124 08/13/2013 1132   GLUCOSE 85 05/12/2013 1048   BUN 28* 08/22/2013 0620   BUN 20.8 08/13/2013 1132   CREATININE 0.89 08/22/2013 0620   CREATININE 1.2 08/13/2013 1132   CALCIUM 9.2 08/22/2013 0620   CALCIUM 9.3 08/13/2013 1132   GFRNONAA 77* 08/22/2013 0620   GFRAA 90* 08/22/2013 0620   CBC    Component Value Date/Time   WBC 8.3 08/22/2013 0620   WBC 5.8 08/13/2013 1132   RBC 3.13* 08/22/2013 0620   RBC 3.31* 08/13/2013 1132   RBC 3.57* 06/28/2011 1103   HGB 9.4* 08/22/2013 0620   HGB 10.0* 08/13/2013 1132   HCT 27.5* 08/22/2013 0620   HCT 29.9* 08/13/2013 1132   PLT 170 08/22/2013 0620   PLT 167 08/13/2013 1132   MCV 87.9 08/22/2013 0620   MCV 90.4 08/13/2013 1132   MCH 30.0 08/22/2013 0620   MCH 30.3 08/13/2013 1132   MCHC 34.2 08/22/2013 0620   MCHC 33.5 08/13/2013 1132   RDW 15.7* 08/22/2013 0620   RDW 17.0* 08/13/2013 1132   LYMPHSABS 1.6 08/22/2013 0620   LYMPHSABS 1.7 08/13/2013 1132   MONOABS 0.9 08/22/2013 0620   MONOABS 0.5 08/13/2013 1132   EOSABS 0.1 08/22/2013 0620   EOSABS 0.1 08/13/2013 1132   BASOSABS 0.0 08/22/2013 0620   BASOSABS 0.1 08/13/2013 1132    Studies/Results: No results found.  Medications: I have reviewed the patient's current medications.  Assessment/Plan:   1. DVT Prophylaxis/Anticoagulation: Mechanical: Sequential compression devices, below knee Bilateral lower extremities  2. Pain Management: Dilaudid po on chronically---back to q6am scheduled, change prn to Q4 prn  - kpad, muscle rub for left thigh--  -likely radicular component to leg pain. He doesn't want to increase gabapentin due to cost.  3. Mood: Monitor mood, pt is discouraged  4. Neuropsych: This patient is capable of making decisions on his own behalf.  5. Multiple myeloma, f/u Dr Darnelle Catalan as outpt.  6. ABLA, S/P transfusion, monitor Hgb  7.  HTN: Zestril, coreg  8. Renal insufficiency-bun,cr improving  9. BPH Flomax , voiding,  10. Constipation-- now w/loose stools - senokot s. prn. And prn miralax. Overall better regulated 11. Wound- dc'ed staples    Length of stay, days: 10  Sonda Primes , MD 08/31/2013, 8:35 AM

## 2013-08-31 NOTE — Progress Notes (Signed)
Physical Therapy Note  Patient Details  Name: JORDY VERBA MRN: [id number] Date of Birth: 1927-11-21 Today's Date: 08/31/2013  1600-1625 (25 minutes) individual Pain: 6/10 rt hip/ premedicated Focus of treatment: bed mobility training (std bed) Treatment: sit to stand from wc SBA ; transfer RW SBA (WBAT RT LE); sit to stand from standard bed min assist ; sit to supine X 3 min assist RT LE + vcs to maintain RT knee extension; supine to sit X 3 SBA ; wc mobility- 100 feet SBA.    Alec Jaros,JIM 08/31/2013, 4:35 PM

## 2013-08-31 NOTE — Progress Notes (Signed)
Occupational Therapy Note   Patient Details  Name: Timothy [name] MRN: [id number] Date of Birth: 1928-02-25 Today's Date: 08/31/2013  Time:  1530-1600  (30 min) Pain:l    Right hip  7/10  Individual session   Engaged in wc mobility, therapeutic exercises.  Pt. Sitting in wc upon OT arrival.  Propelled wc to gym.   Used the nustep at 3 wkload for 10 minutes.   Left  pt with PT for next session.   Humberto Seals 08/31/2013, 4:04 PM

## 2013-09-01 ENCOUNTER — Inpatient Hospital Stay (HOSPITAL_COMMUNITY): Payer: Medicare Other

## 2013-09-01 ENCOUNTER — Inpatient Hospital Stay (HOSPITAL_COMMUNITY): Payer: Medicare Other | Admitting: Occupational Therapy

## 2013-09-01 NOTE — Progress Notes (Signed)
Occupational Therapy Session Note  Patient Details  Name: Timothy [name] MRN: [id number] Date of Birth: [birth year]  Today's Date: 09/01/2013 Time: 0700-0757 Time Calculation (min): 57 min  Short Term Goals: Week 2:  OT Short Term Goal 1 (Week 2): Pt will perform sit<>stand with min A during bathing and dressing tasks OT Short Term Goal 2 (Week 2): Pt will perform LB dressing with moderate assistance using AE PRN OT Short Term Goal 3 (Week 2): Pt will perform toilet transfer with min A OT Short Term Goal 4 (Week 2): Pt will perform toileting tasks with min A  Skilled Therapeutic Interventions/Progress Updates:    Pt resting in bed upon arrival.  Pt required min A for supine->sit EOB in preparation for amb with RW to w/c at sink.  Pt required mod A for sit->stand from EOB and steady A when ambulating to w/c.  Pt completed bathing tasks with sit<>stand from w/c to bathe buttocks.  Pt performed sit<>stand from w/c at sink with supervision and required steady A while standing at sink to complete bathing tasks.  Pt attempted to use sock aide to don socks but was unsuccessful.  Pt currently has dressings on bilateral heals which inhibited effectiveness of sock aide.  Pt stated that prior to this hospitalization his wife was helping him with his socks.  Focus on activity tolerance, bed mobility, transfers, functional amb with RW, sit<>stand, dynamic standing balance, and safety awareness.  Therapy Documentation Precautions:  Precautions Precautions: Fall Restrictions Weight Bearing Restrictions: Yes RLE Weight Bearing: Weight bearing as tolerated Pain: Pain Assessment Pain Assessment: 0-10 Pain Score: 4  Pain Type: Acute pain Pain Location: Leg Pain Orientation: Right;Left Pain Descriptors / Indicators: Aching Pain Onset: On-going Pain Intervention(s): RN made aware  See FIM for current functional status  Therapy/Group: Individual Therapy  Rich Brave 09/01/2013, 8:01  AM

## 2013-09-01 NOTE — Progress Notes (Signed)
Subjective/Complaints: "sore from using the eliptical machine"---feeling better today.    A 12 point review of systems has been performed and if not noted above is otherwise negative.   Objective:  Vital Signs: Blood pressure 146/66, pulse 69, temperature 97.9 F (36.6 C), temperature source Oral, resp. rate 18, height 5\' 10"  (1.778 m), weight 77.8 kg (171 lb 8.3 oz), SpO2 98.00%. No results found. No results found for this basename: WBC, HGB, HCT, PLT,  in the last 72 hours No results found for this basename: NA, K, CL, CO, GLUCOSE, BUN, CREATININE, CALCIUM,  in the last 72 hours CBG (last 3)  No results found for this basename: GLUCAP,  in the last 72 hours  Wt Readings from Last 3 Encounters:  09/01/13 77.8 kg (171 lb 8.3 oz)  08/19/13 79.9 kg (176 lb 2.4 oz)  08/19/13 79.9 kg (176 lb 2.4 oz)    Physical Exam:  General: No acute distress  Mood and affect are appropriate  Heart: Regular rate and rhythm no rubs murmurs or extra sounds  Lungs: Clear to auscultation, breathing unlabored, no rales or wheezes  Abdomen: sluggish bowel sounds, soft nontender to palpation, distended  Extremities: No clubbing, cyanosis, or edema, Left distal leg wound draining serosang, R prox hi[ wound draining serosang no erythema  Skin: right hip clean, intact without drainage, lac left ankle with no dc Neurologic: Cranial nerves II through XII intact, motor strength is 5/5 in bilateral deltoid, bicep, tricep, grip, ,5/5 Lefthip flexor KE and 1/5 RHF, knee extensors, 4/5 Bilateral ankle dorsiflexor and plantar flexor  Sensory exam normal sensation to light touch and proprioception in bilateral upper and lower extremities  Cerebellar exam normal finger to nose to finger as well as heel to shin in bilateral upper and lower extremities  Musculoskeletal: Decreased range of motion RLE. Left quad, anterior thigh sore with palpation and flexion of knee, hip flexion.   Assessment/Plan: 1. Functional deficits  secondary to right intertrochanteric hip fx which require 3+ hours per day of interdisciplinary therapy in a comprehensive inpatient rehab setting. Physiatrist is providing close team supervision and 24 hour management of active medical problems listed below. Physiatrist and rehab team continue to assess barriers to discharge/monitor patient progress toward functional and medical goals. FIM: FIM - Bathing Bathing Steps Patient Completed: Chest;Right Arm;Left Arm;Abdomen;Front perineal area;Buttocks;Right upper leg;Left upper leg Bathing: 4: Min-Patient completes 8-9 16f 10 parts or 75+ percent  FIM - Upper Body Dressing/Undressing Upper body dressing/undressing steps patient completed: Thread/unthread right sleeve of pullover shirt/dresss;Thread/unthread left sleeve of pullover shirt/dress;Put head through opening of pull over shirt/dress;Pull shirt over trunk Upper body dressing/undressing: 5: Set-up assist to: Obtain clothing/put away FIM - Lower Body Dressing/Undressing Lower body dressing/undressing steps patient completed: Thread/unthread right pants leg;Thread/unthread left pants leg;Pull pants up/down;Don/Doff left shoe Lower body dressing/undressing: 2: Max-Patient completed 25-49% of tasks  FIM - Toileting Toileting steps completed by patient: Performs perineal hygiene Toileting: 2: Max-Patient completed 1 of 3 steps  FIM - Diplomatic Services operational officer Devices: Grab bars Toilet Transfers: 2-To toilet/BSC: Max A (lift and lower assist)  FIM - Press photographer Assistive Devices: Bed rails;Walker Bed/Chair Transfer: 4: Supine > Sit: Min A (steadying Pt. > 75%/lift 1 leg);3: Bed > Chair or W/C: Mod A (lift or lower assist)  FIM - Locomotion: Wheelchair Locomotion: Wheelchair: 5: Travels 150 ft or more: maneuvers on rugs and over door sills with supervision, cueing or coaxing FIM - Locomotion: Ambulation Locomotion: Ambulation Assistive Devices:  Walker - Rolling Ambulation/Gait Assistance: 4: Min assist Locomotion: Ambulation: 1: Travels less than 50 ft with minimal assistance (Pt.>75%)  Comprehension Comprehension Mode: Auditory Comprehension: 6-Follows complex conversation/direction: With extra time/assistive device  Expression Expression Mode: Verbal Expression: 6-Expresses complex ideas: With extra time/assistive device  Social Interaction Social Interaction: 6-Interacts appropriately with others with medication or extra time (anti-anxiety, antidepressant).  Problem Solving Problem Solving: 6-Solves complex problems: With extra time  Memory Memory: 6-More than reasonable amt of time  Medical Problem List and Plan:  1. DVT Prophylaxis/Anticoagulation: Mechanical: Sequential compression devices, below knee Bilateral lower extremities  2. Pain Management: Dilaudid po on chronically---back to q6am  scheduled, change prn to Q4 prn with fair control  -  kpad, muscle rub for left thigh--  -likely radicular component to leg pain. He doesn't want to increase gabapentin due to cost.    3. Mood: Monitor mood, pt is discouraged  4. Neuropsych: This patient is capable of making decisions on his own behalf.  5. Multiple myeloma, f/u Dr Darnelle Catalan as outpt.  6. ABLA, S/P transfusion, recheck hgb tomorrow 7. HTN: Zestril, coreg  8. Renal insufficiency-bun,cr improving--recheck tomorrow 9. BPH Flomax , voiding,  10. Constipation--scheduled senokot s.  Prn miralax 11. Wound- dc'ed staples  LOS (Days) 11 A FACE TO FACE EVALUATION WAS PERFORMED  SWARTZ,ZACHARY T 09/01/2013 8:24 AM

## 2013-09-01 NOTE — Progress Notes (Signed)
Physical Therapy Session Note  Patient Details  Name: GREIG ALTERGOTT MRN: [id number] Date of Birth: 03-03-1928  Today's Date: 09/01/2013 Time: 1308-6578 Time Calculation (min): 30 min   Skilled Therapeutic Interventions/Progress Updates:    session focused on bed mobility and transfers in hospital bed as well as ADL apartment bed to simulate home set up. Introduced leg lifter and pt seemed to like it to control his RLE. Recommended to wife that if pt continues to feel it is beneficial, they are available in the gift shop downstairs. Pt able to complete supine <-> sit with S multiple times using momentum to get RLE onto the bed. Increased difficulty from sidelying to sit but with extra time, able to manage with cues.   Therapy Documentation Precautions:  Precautions Precautions: Fall Restrictions Weight Bearing Restrictions: Yes RLE Weight Bearing: Weight bearing as tolerated   Pain:  Denies pain.  See FIM for current functional status  Therapy/Group: Individual Therapy  [name] [facility] 09/01/2013, 2:59 PM

## 2013-09-01 NOTE — Progress Notes (Signed)
Occupational Therapy Session Note  Patient Details  Name: Timothy [name] MRN: [id number] Date of Birth: December 09, 1927  Today's Date: 09/01/2013 Time: 4098-1191 Time Calculation (min): 40 min  Short Term Goals: Week 1:  OT Short Term Goal 1 (Week 1): Patient will perform sit<>stands with moderate assistance in order to complete LB ADLs more independently OT Short Term Goal 1 - Progress (Week 1): Met OT Short Term Goal 2 (Week 1): Patient will perform UB/LB bathing with moderate assistance OT Short Term Goal 2 - Progress (Week 1): Met OT Short Term Goal 3 (Week 1): Patient will perform LB dressing with moderate assistance using AE prn OT Short Term Goal 3 - Progress (Week 1): Progressing toward goal OT Short Term Goal 4 (Week 1): Patient will perform toilet transfer with moderate assistance OT Short Term Goal 4 - Progress (Week 1): Met OT Short Term Goal 5 (Week 1): Patient will be educated on an UE HEP to improve overall strength & endurance in order to increase overall independence with functional mobility/transfers and ADLs OT Short Term Goal 5 - Progress (Week 1): Met  Week 2:  OT Short Term Goal 1 (Week 2): Pt will perform sit<>stand with min A during bathing and dressing tasks OT Short Term Goal 2 (Week 2): Pt will perform LB dressing with moderate assistance using AE PRN OT Short Term Goal 3 (Week 2): Pt will perform toilet transfer with min A OT Short Term Goal 4 (Week 2): Pt will perform toileting tasks with min A  Skilled Therapeutic Interventions/Progress Updates:  Patient found seated in w/c with wife present. Patient stood with RW, then ambulated -> w/c seated outside of room with minimal assistance (for sit->stand & functional ambulation). Therapist then propelled patient -> ortho gym. Patient engaged in functional ambulation/mobility using rolling walker over blue simulated block with minimal assistance and bed mobility with minimal assistance. Patient has made great gains since  last week and has had an increase independence. At end of session, left patient in w/c with family assisting him back to his room.   Precautions:  Precautions Precautions: Fall Restrictions Weight Bearing Restrictions: Yes RLE Weight Bearing: Weight bearing as tolerated  See FIM for current functional status  Therapy/Group: Individual Therapy  Meleena Munroe 09/01/2013, 11:27 AM

## 2013-09-01 NOTE — Progress Notes (Signed)
Physical Therapy Session Note  Patient Details  Name: ANDRIUS ANDREPONT MRN: [id number] Date of Birth: [birth year]  Today's Date: 09/01/2013 Time: 0830-0927 Time Calculation (min): 57 min  Short Term Goals: Week 2:  PT Short Term Goal 1 (Week 2): = LTGs  Skilled Therapeutic Interventions/Progress Updates:    Session focused on gait training with RW, curb step negotiation with RW, simulated car transfer, basic transfers/bed mobility and w/c mobility. Pt able to complete all sit to stands with steady A to close S. Gait trial x 3 each about 30' with steady A, cues for increased knee extension during stance phase of gait. Pt able to transfer into simulated car (SUV height) with S but required min A to transfer out of car. Required heavy min to light mod A for curb step negotiation due to decreased ability to tolerate single limb WB on RLE when stepping up onto the curb. Returned to bed end of session with min A overall and encouraged not to use trapeeze as pt will not have this at home.   Therapy Documentation Precautions:  Precautions Precautions: Fall Restrictions Weight Bearing Restrictions: Yes RLE Weight Bearing: Weight bearing as tolerated  Pain: Premedicated for pain on R hip.  See FIM for current functional status  Therapy/Group: Individual Therapy  [name] [facility] 09/01/2013, 8:30 AM

## 2013-09-02 ENCOUNTER — Inpatient Hospital Stay (HOSPITAL_COMMUNITY): Payer: Medicare Other

## 2013-09-02 ENCOUNTER — Inpatient Hospital Stay (HOSPITAL_COMMUNITY): Payer: Medicare Other | Admitting: Occupational Therapy

## 2013-09-02 DIAGNOSIS — S72143A Displaced intertrochanteric fracture of unspecified femur, initial encounter for closed fracture: Secondary | ICD-10-CM

## 2013-09-02 LAB — CBC
HCT: 25.8 % — ABNORMAL LOW (ref 39.0–52.0)
MCH: 29 pg (ref 26.0–34.0)
MCHC: 32.9 g/dL (ref 30.0–36.0)
Platelets: 326 10*3/uL (ref 150–400)
RBC: 2.93 MIL/uL — ABNORMAL LOW (ref 4.22–5.81)
RDW: 15.1 % (ref 11.5–15.5)
WBC: 7.4 10*3/uL (ref 4.0–10.5)

## 2013-09-02 LAB — BASIC METABOLIC PANEL
BUN: 25 mg/dL — ABNORMAL HIGH (ref 6–23)
CO2: 24 mEq/L (ref 19–32)
Calcium: 8.2 mg/dL — ABNORMAL LOW (ref 8.4–10.5)
GFR calc Af Amer: 78 mL/min — ABNORMAL LOW (ref >90)
GFR calc non Af Amer: 67 mL/min — ABNORMAL LOW (ref >90)
Sodium: 132 mEq/L — ABNORMAL LOW (ref 135–145)

## 2013-09-02 NOTE — Progress Notes (Signed)
Occupational Therapy Session Note  Patient Details  Name: Timothy [name] MRN: [id number] Date of Birth: 04/29/1928  Today's Date: 09/02/2013 Time: 0700-0755 Time Calculation (min): 55 min  Short Term Goals: Week 2:  OT Short Term Goal 1 (Week 2): Pt will perform sit<>stand with min A during bathing and dressing tasks OT Short Term Goal 2 (Week 2): Pt will perform LB dressing with moderate assistance using AE PRN OT Short Term Goal 3 (Week 2): Pt will perform toilet transfer with min A OT Short Term Goal 4 (Week 2): Pt will perform toileting tasks with min A  Skilled Therapeutic Interventions/Progress Updates:    Pt resting in bed with MD present.  Pt engaged in bathing and dressing with sit<>stand from w/c at sink.  Pt performed supine->sit EOB at supervision level with HOB elevated and using bed rails.  Pt performed sit->stand from EOB with supervision and bed elevated approx 3" from standard height.  Pt completed bathing and dressing tasks requiring steady A when pulling up pants and assistance with donning socks and tieing shoes.  Pt performed all sit<>stand from w/c with supervision.  Pt progressing with overall activity tolerance and transitional movements.  Focus on activity tolerance, sit<>stand, dynamic standing balance, and safety awareness.  Therapy Documentation Precautions:  Precautions Precautions: Fall Restrictions Weight Bearing Restrictions: Yes RLE Weight Bearing: Weight bearing as tolerated Pain: Pain Assessment Pain Assessment: 0-10 Pain Score: 4  Pain Type: Acute pain Pain Location: Leg Pain Orientation: Right;Left Pain Descriptors / Indicators: Aching Pain Onset: On-going Pain Intervention(s): RN made aware  See FIM for current functional status  Therapy/Group: Individual Therapy  Rich Brave 09/02/2013, 7:57 AM

## 2013-09-02 NOTE — Progress Notes (Signed)
Subjective/Complaints: Had a good night. No new complaints. Left leg stiff at times    A 12 point review of systems has been performed and if not noted above is otherwise negative.   Objective:  Vital Signs: Blood pressure 124/73, pulse 77, temperature 97.8 F (36.6 C), temperature source Oral, resp. rate 16, height 5\' 10"  (1.778 m), weight 76.522 kg (168 lb 11.2 oz), SpO2 96.00%. No results found.  Recent Labs  09/02/13 0500  WBC 7.4  HGB 8.5*  HCT 25.8*  PLT 326    Recent Labs  09/02/13 0500  NA 132*  K 4.0  CL 99  GLUCOSE 101*  BUN 25*  CREATININE 1.01  CALCIUM 8.2*   CBG (last 3)  No results found for this basename: GLUCAP,  in the last 72 hours  Wt Readings from Last 3 Encounters:  09/02/13 76.522 kg (168 lb 11.2 oz)  08/19/13 79.9 kg (176 lb 2.4 oz)  08/19/13 79.9 kg (176 lb 2.4 oz)    Physical Exam:  General: No acute distress  Mood and affect are appropriate  Heart: Regular rate and rhythm no rubs murmurs or extra sounds  Lungs: Clear to auscultation, breathing unlabored, no rales or wheezes  Abdomen: sluggish bowel sounds, soft nontender to palpation, distended  Extremities: No clubbing, cyanosis, or edema, Left distal leg wound draining serosang, R prox hi[ wound draining serosang no erythema  Skin: right hip clean, intact without drainage, lac left ankle with no dc Neurologic: Cranial nerves II through XII intact, motor strength is 5/5 in bilateral deltoid, bicep, tricep, grip, ,5/5 Lefthip flexor KE and 1/5 RHF, knee extensors, 4/5 Bilateral ankle dorsiflexor and plantar flexor  Sensory exam normal sensation to light touch and proprioception in bilateral upper and lower extremities  Cerebellar exam normal finger to nose to finger as well as heel to shin in bilateral upper and lower extremities  Musculoskeletal: Decreased range of motion RLE. Left quad, anterior thigh sore with palpation and flexion of knee, hip flexion.   Assessment/Plan: 1.  Functional deficits secondary to right intertrochanteric hip fx which require 3+ hours per day of interdisciplinary therapy in a comprehensive inpatient rehab setting. Physiatrist is providing close team supervision and 24 hour management of active medical problems listed below. Physiatrist and rehab team continue to assess barriers to discharge/monitor patient progress toward functional and medical goals. FIM: FIM - Bathing Bathing Steps Patient Completed: Chest;Right Arm;Left Arm;Abdomen;Front perineal area;Buttocks;Right upper leg;Left upper leg Bathing: 4: Min-Patient completes 8-9 39f 10 parts or 75+ percent  FIM - Upper Body Dressing/Undressing Upper body dressing/undressing steps patient completed: Thread/unthread right sleeve of pullover shirt/dresss;Thread/unthread left sleeve of pullover shirt/dress;Put head through opening of pull over shirt/dress;Pull shirt over trunk Upper body dressing/undressing: 5: Set-up assist to: Obtain clothing/put away FIM - Lower Body Dressing/Undressing Lower body dressing/undressing steps patient completed: Thread/unthread right pants leg;Thread/unthread left pants leg;Pull pants up/down;Don/Doff right shoe;Don/Doff left shoe Lower body dressing/undressing: 3: Mod-Patient completed 50-74% of tasks  FIM - Toileting Toileting steps completed by patient: Performs perineal hygiene Toileting: 2: Max-Patient completed 1 of 3 steps  FIM - Diplomatic Services operational officer Devices: Grab bars Toilet Transfers: 2-To toilet/BSC: Max A (lift and lower assist)  FIM - Banker Devices: Walker;Bed rails;HOB elevated Bed/Chair Transfer: 5: Supine > Sit: Supervision (verbal cues/safety issues);5: Bed > Chair or W/C: Supervision (verbal cues/safety issues)  FIM - Locomotion: Wheelchair Locomotion: Wheelchair: 6: Travels 150 ft or more, turns around, maneuvers to table, bed or  toilet, negotiates 3% grade: maneuvers on  rugs and over door sills independently FIM - Locomotion: Ambulation Locomotion: Ambulation Assistive Devices: Walker - Rolling Ambulation/Gait Assistance: 4: Min assist Locomotion: Ambulation: 1: Travels less than 50 ft with minimal assistance (Pt.>75%)  Comprehension Comprehension Mode: Auditory Comprehension: 6-Follows complex conversation/direction: With extra time/assistive device  Expression Expression Mode: Verbal Expression: 6-Expresses complex ideas: With extra time/assistive device  Social Interaction Social Interaction: 6-Interacts appropriately with others with medication or extra time (anti-anxiety, antidepressant).  Problem Solving Problem Solving: 6-Solves complex problems: With extra time  Memory Memory: 6-More than reasonable amt of time  Medical Problem List and Plan:  1. DVT Prophylaxis/Anticoagulation: Mechanical: Sequential compression devices, below knee Bilateral lower extremities  2. Pain Management: Dilaudid po on chronically---back to q6am  scheduled, change prn to Q4 prn with fair control  -  kpad, muscle rub for left thigh--  -likely radicular component to leg pain. He doesn't want to increase gabapentin due to cost.    3. Mood: Monitor mood, pt is discouraged  4. Neuropsych: This patient is capable of making decisions on his own behalf.  5. Multiple myeloma, f/u Dr Darnelle Catalan as outpt.  6. ABLA, S/P transfusion, hgb 8.5---recheck tomorrow 7. HTN: Zestril, coreg  8. Renal insufficiency-bun,cr continue to slowly improve 9. BPH Flomax , voiding,  10. Constipation--scheduled senokot s.  Prn miralax 11. Wound- dc'ed staples  LOS (Days) 12 A FACE TO FACE EVALUATION WAS PERFORMED  SWARTZ,ZACHARY T 09/02/2013 8:31 AM

## 2013-09-02 NOTE — Progress Notes (Signed)
Occupational Therapy Session Notes  Patient Details  Name: DEWAYNE SEVERE MRN: [id number] Date of Birth: [birth year]  Today's Date: 09/02/2013  Short Term Goals: Week 1:  OT Short Term Goal 1 (Week 1): Patient will perform sit<>stands with moderate assistance in order to complete LB ADLs more independently OT Short Term Goal 1 - Progress (Week 1): Met OT Short Term Goal 2 (Week 1): Patient will perform UB/LB bathing with moderate assistance OT Short Term Goal 2 - Progress (Week 1): Met OT Short Term Goal 3 (Week 1): Patient will perform LB dressing with moderate assistance using AE prn OT Short Term Goal 3 - Progress (Week 1): Progressing toward goal OT Short Term Goal 4 (Week 1): Patient will perform toilet transfer with moderate assistance OT Short Term Goal 4 - Progress (Week 1): Met OT Short Term Goal 5 (Week 1): Patient will be educated on an UE HEP to improve overall strength & endurance in order to increase overall independence with functional mobility/transfers and ADLs OT Short Term Goal 5 - Progress (Week 1): Met  Week 2:  OT Short Term Goal 1 (Week 2): Pt will perform sit<>stand with min A during bathing and dressing tasks OT Short Term Goal 2 (Week 2): Pt will perform LB dressing with moderate assistance using AE PRN OT Short Term Goal 3 (Week 2): Pt will perform toilet transfer with min A OT Short Term Goal 4 (Week 2): Pt will perform toileting tasks with min A  Skilled Therapeutic Interventions/Progress Updates:   Session #1 [id number] - 40 Minutes Individual Therapy No complaints of pain Patient found seated in w/c beside bed. Therapist discussed home set-up with patient regarding ADL and grooming tasks. Patient then propelled self from room -> therapy gym. Once in gym focused skilled intervention on facilitating weight bearing through RLE, dynamic standing balance/tolerance/endurance, sit<>stands, and BUE HEP. Therapist propelled patient back to room at end of session and  left patient seated in w/c beside bed with call bell & phone within reach.   Session #2 [id number] - 30 Minutes Individual Therapy No complaints of pain Patient found seated in w/c with wife present. Therapist discussed d/c date of 10/17 and patient & wife happy the d/c date has moved up four days. Patient & wife stated they have a shower seat and elevated toilet seats, they plan on putting grab bars around toilet. Family education completed with toilet transfers with patient and wife, wife is independent to assist patient with this task; NT aware. Plan to make patient BUE HEP with dumbbells. Recommending reacher for patient's use. Patient & wife stated the wife assisted with shoes PTA and will continue to assist with shoes once discharged. During OT sessions, have continued to encourage patient to complete LB dressing more independently; patient continues to refuse and want wife to do this task for him. At end of session, left patient seated in w/c with call bell & phone within reach.   Precautions:  Precautions Precautions: Fall Restrictions Weight Bearing Restrictions: Yes RLE Weight Bearing: Weight bearing as tolerated  See FIM for current functional status  Dawt Reeb 09/02/2013, 7:33 AM

## 2013-09-02 NOTE — Progress Notes (Signed)
Physical Therapy Session Note  Patient Details  Name: Timothy [name] MRN: [id number] Date of Birth: Oct 21, 1928  Today's Date: 09/02/2013 Time: 0830-0925 Time Calculation (min): 55 min  Short Term Goals: Week 2:  PT Short Term Goal 1 (Week 2): = LTGs  Skilled Therapeutic Interventions/Progress Updates:    Session focused on gait training with RW, curb step negotiation with RW, and bed mobility on ADL bed with leg lifter. Pt able to gait x 25', x 40', and x 20' during trials with cues for posture and step length to improve more normal gait pattern. Pt required heavy min A (therapist to block at R knee) during curb step negotiation, steady A when descending curb. With leg lifter, pt able to complete bed mobility with S/mod I on ADL apartment bed. Intermittent w/c propulsion for endurance and strengthening of UE's.   Therapy Documentation Precautions:  Precautions Precautions: Fall Restrictions Weight Bearing Restrictions: Yes RLE Weight Bearing: Weight bearing as tolerated   Pain: Premedicated for pain.   See FIM for current functional status  Therapy/Group: Individual Therapy  [name] [facility] 09/02/2013, 9:28 AM

## 2013-09-03 ENCOUNTER — Inpatient Hospital Stay (HOSPITAL_COMMUNITY): Payer: Medicare Other

## 2013-09-03 ENCOUNTER — Inpatient Hospital Stay (HOSPITAL_COMMUNITY): Payer: Medicare Other | Admitting: *Deleted

## 2013-09-03 DIAGNOSIS — S72143A Displaced intertrochanteric fracture of unspecified femur, initial encounter for closed fracture: Secondary | ICD-10-CM

## 2013-09-03 LAB — CBC
MCH: 29.7 pg (ref 26.0–34.0)
MCV: 88.6 fL (ref 78.0–100.0)
Platelets: 318 10*3/uL (ref 150–400)
RBC: 2.9 MIL/uL — ABNORMAL LOW (ref 4.22–5.81)
WBC: 6.4 10*3/uL (ref 4.0–10.5)

## 2013-09-03 MED ORDER — VITAMIN C 500 MG PO TABS
500.0000 mg | ORAL_TABLET | ORAL | Status: DC
  Administered 2013-09-03 – 2013-09-04 (×2): 500 mg via ORAL
  Filled 2013-09-03 (×3): qty 1

## 2013-09-03 NOTE — Progress Notes (Signed)
Occupational Therapy Session Note  Patient Details  Name: Timothy [name] MRN: [id number] Date of Birth: [birth year]  Today's Date: 09/03/2013  Session 1 Time: 0700-0756 Time Calculation (min): 56 min  Short Term Goals: Week 1:  OT Short Term Goal 1 (Week 1): Patient will perform sit<>stands with moderate assistance in order to complete LB ADLs more independently OT Short Term Goal 1 - Progress (Week 1): Met OT Short Term Goal 2 (Week 1): Patient will perform UB/LB bathing with moderate assistance OT Short Term Goal 2 - Progress (Week 1): Met OT Short Term Goal 3 (Week 1): Patient will perform LB dressing with moderate assistance using AE prn OT Short Term Goal 3 - Progress (Week 1): Progressing toward goal OT Short Term Goal 4 (Week 1): Patient will perform toilet transfer with moderate assistance OT Short Term Goal 4 - Progress (Week 1): Met OT Short Term Goal 5 (Week 1): Patient will be educated on an UE HEP to improve overall strength & endurance in order to increase overall independence with functional mobility/transfers and ADLs OT Short Term Goal 5 - Progress (Week 1): Met  Skilled Therapeutic Interventions/Progress Updates:    Pt engaged in bathing and dressing tasks with sit<>stand from w/c at sink with primary focus on sit<>stand, functional amb with RW in room, dynamic standing balance, AE use, activity tolerance, and safety awareness.  Pt declined to take shower this morning.  Pt performs sit<>stand and transfers with supervision with occasional verbal cues for proper body positioning in preparation for sit<>stand.  Pt stated that his wife has been helping him with donning socks and tieing his shoes prior to this hospitalization.  Pt has not interest in using AE to accomplish task.  Therapy Documentation Precautions:  Precautions Precautions: Fall Restrictions Weight Bearing Restrictions: Yes RLE Weight Bearing: Weight bearing as tolerated Pain: Pain Assessment Pain  Assessment: 0-10 Pain Score: 2  Pain Type: Acute pain Pain Location: Hip Pain Orientation: Right Pain Descriptors / Indicators: Aching Pain Intervention(s): RN made aware;Repositioned  See FIM for current functional status  Therapy/Group: Individual Therapy  Session 2 Time: 1100-1145 Pt initially denied pain but c/o increased discomfort with activity; unrated Individual Therapy  Pt practiced walk-in shower transfers X 3, bed mobility X 2, and toilet transfers X 3.  Pt amb with RW to practice shower with 4" ledge to step over.  Pt required supervision when stepping over into shower.  Pt amb with RW into bathroom to practice toilet transfers and requested to use toilet for bowel movement.  Pt completed all tasks with supervision. Pt requires extra time to complete all tasks and multiple rest breaks.  Discussed energy conservation, home safety, and scheduling after discharge.    Lavone Neri [facility] 09/03/2013, 7:57 AM

## 2013-09-03 NOTE — Plan of Care (Signed)
Problem: RH BOWEL ELIMINATION Goal: RH STG MANAGE BOWEL W/MEDICATION W/ASSISTANCE STG Manage Bowel with Medication with mod Assistance.  Outcome: Not Progressing Miralax given this morning.  If no BM by this evening, agreed to take senokot.

## 2013-09-03 NOTE — Patient Care Conference (Signed)
Inpatient RehabilitationTeam Conference and Plan of Care Update Date: 09/02/2013   Time: 2:00 PM    Patient Name: Timothy [name]      Medical Record Number: [id number]  Date of Birth: [birth year] Sex: Male         Room/Bed: 4W23C/4W23C-01 Payor Info: Payor: MEDICARE / Plan: MEDICARE PART A AND B / Product Type: *No Product type* /    Admitting Diagnosis: HIP FX TRANSFUSION  Admit Date/Time:  08/21/2013  4:34 PM Admission Comments: No comment available   Primary Diagnosis:  Intertrochanteric fracture of right hip Principal Problem: Intertrochanteric fracture of right hip  Patient Active Problem List   Diagnosis Date Noted  . Acute blood loss anemia 08/19/2013  . Intertrochanteric fracture of right hip 08/16/2013  . Acute on chronic systolic congestive heart failure 03/10/2012  . Hypokalemia 03/10/2012  . Osteomyelitis of left foot 03/06/2012  . Open wound of left foot 03/06/2012  . Foot ulcer, left 01/10/2012  . Hyponatremia 01/10/2012  . Systolic CHF, chronic 11/08/2011  . CAD (coronary artery disease) 11/08/2011  . S/P CABG (coronary artery bypass graft) 11/08/2011  . Anemia 11/06/2011  . Myeloma 09/12/2011  . Ischemic cardiomyopathy 04/21/2011  . Multiple myeloma 04/21/2011  . Shortness of breath dyspnea 04/21/2011  . Hypertension 04/21/2011  . Renal insufficiency 04/21/2011    Expected Discharge Date: Expected Discharge Date: 09/05/13  Team Members Present: Physician leading conference: Dr. Faith Rogue Social Worker Present: Amada Jupiter, LCSW Nurse Present:  Ronny Bacon, RN) PT Present: [name], Jerrye Bushy, PT OT Present: Edwin Cap, Loistine Chance, OT SLP Present: Feliberto Gottron, SLP PPS Coordinator present : Edson Snowball, PT     Current Status/Progress Goal Weekly Team Focus  Medical   pain improving. needes some motivation  stabilize medically for dc  pain control, wound care, motivation.   Bowel/Bladder   Continent of  bowel/bladder, last BM is on 08/31/13  Continue to be continent of bowel and bladder  Monitor for constipation   Swallow/Nutrition/ Hydration             ADL's   overall min assist except supervision for UB dressing and max assust for LB dressing  overall supervision  ADL retraining, sit<>stands, dynamic standing, functional transfers, functional ambulation, activity tolerance/endurance, pain management   Mobility   min A for transfers and gait, mod A for curb step for home entry, S/mod I for w/c propulsion  overall S for transfers, gait; mod I w/c mobility; min A stairs and car  WB and functional strengthening of RLE especially, sit to stands, activity tolerance, bed mobility, curb step negotiation, gait   Communication             Safety/Cognition/ Behavioral Observations  Pt's cognition is intact, calls appropriately. Needs two persons assist for transfer. WBAT to Rt leg  Pt to transfer with one person without injury  Pt will be free from injury on transfers   Pain   Pt C/O  pain to Rt hip, on dilaudid scheduled & PRN. Also, has order for Robaxin. Pt usually takes his pain med twice at night   Pt's pain to be controlled < 3  Monitor for effectiveness of pain med   Skin   Incision to Rt hip with steri strips, left lower leg laceration with steri strips, bil unsteageable pressure ulcer. Pt has pravelon booth on left lower leg   Turn and reposition q 2hrs if pt is compliant  No further skin breakdown    Rehab Goals  Patient on target to meet rehab goals: Yes *See Care Plan and progress notes for long and short-term goals.  Barriers to Discharge: see prior    Possible Resolutions to Barriers:  family ed    Discharge Planning/Teaching Needs:  correction - home with wife to provide primary care 24/7   ongoing   Team Discussion:  Much better participation this week.  Meeting supervision goals except minimal assist with steps. Can move up d/c date as meeting goals quicker  Revisions to  Treatment Plan:  Change in d/c date to 09/05/13   Continued Need for Acute Rehabilitation Level of Care: The patient requires daily medical management by a physician with specialized training in physical medicine and rehabilitation for the following conditions: Daily direction of a multidisciplinary physical rehabilitation program to ensure safe treatment while eliciting the highest outcome that is of practical value to the patient.: Yes Daily medical management of patient stability for increased activity during participation in an intensive rehabilitation regime.: Yes Daily analysis of laboratory values and/or radiology reports with any subsequent need for medication adjustment of medical intervention for : Post surgical problems;Other (pain control)  Timothy [name] 09/03/2013, 9:59 AM

## 2013-09-03 NOTE — Progress Notes (Signed)
Social Work Patient ID: Timothy [name], male   DOB: 1928-10-27, 79 y.o.   MRN: [id number]  Timothy Jupiter, LCSW Social Worker Signed  Patient Care Conference Service date: 09/03/2013 9:59 AM  Inpatient RehabilitationTeam Conference and Plan of Care Update Date: 09/02/2013   Time: 2:00 PM     Patient Name: Timothy [name]       Medical Record Number: [id number]   Date of Birth: 30-May-1928 Sex: Male         Room/Bed: 4W23C/4W23C-01 Payor Info: Payor: MEDICARE / Plan: MEDICARE PART A AND B / Product Type: *No Product type* /   Admitting Diagnosis: HIP FX TRANSFUSION   Admit Date/Time:  08/21/2013  4:34 PM Admission Comments: No comment available   Primary Diagnosis:  Intertrochanteric fracture of right hip Principal Problem: Intertrochanteric fracture of right hip    Patient Active Problem List     Diagnosis  Date Noted   .  Acute blood loss anemia  08/19/2013   .  Intertrochanteric fracture of right hip  08/16/2013   .  Acute on chronic systolic congestive heart failure  03/10/2012   .  Hypokalemia  03/10/2012   .  Osteomyelitis of left foot  03/06/2012   .  Open wound of left foot  03/06/2012   .  Foot ulcer, left  01/10/2012   .  Hyponatremia  01/10/2012   .  Systolic CHF, chronic  11/08/2011   .  CAD (coronary artery disease)  11/08/2011   .  S/P CABG (coronary artery bypass graft)  11/08/2011   .  Anemia  11/06/2011   .  Myeloma  09/12/2011   .  Ischemic cardiomyopathy  04/21/2011   .  Multiple myeloma  04/21/2011   .  Shortness of breath dyspnea  04/21/2011   .  Hypertension  04/21/2011   .  Renal insufficiency  04/21/2011     Expected Discharge Date: Expected Discharge Date: 09/05/13  Team Members Present: Physician leading conference: Dr. Faith Rogue Social Worker Present: Timothy Jupiter, LCSW Nurse Present:  Timothy Bacon, RN) PT Present: Timothy [name], Timothy [name], PT OT Present: Timothy [name], Timothy [name], OT SLP Present: Timothy [name], SLP PPS  Coordinator present : Timothy [name], PT        Current Status/Progress  Goal  Weekly Team Focus   Medical     pain improving. needes some motivation  stabilize medically for dc  pain control, wound care, motivation.   Bowel/Bladder     Continent of bowel/bladder, last BM is on 08/31/13  Continue to be continent of bowel and bladder  Monitor for constipation   Swallow/Nutrition/ Hydration            ADL's     overall min assist except supervision for UB dressing and max assust for LB dressing  overall supervision  ADL retraining, sit<>stands, dynamic standing, functional transfers, functional ambulation, activity tolerance/endurance, pain management   Mobility     min A for transfers and gait, mod A for curb step for home entry, S/mod I for w/c propulsion  overall S for transfers, gait; mod I w/c mobility; min A stairs and car  WB and functional strengthening of RLE especially, sit to stands, activity tolerance, bed mobility, curb step negotiation, gait   Communication            Safety/Cognition/ Behavioral Observations    Pt's cognition is intact, calls appropriately. Needs two persons assist for transfer. WBAT to Rt leg  Pt to transfer with  one person without injury  Pt will be free from injury on transfers   Pain     Pt C/O  pain to Rt hip, on dilaudid scheduled & PRN. Also, has order for Robaxin. Pt usually takes his pain med twice at night   Pt's pain to be controlled < 3  Monitor for effectiveness of pain med   Skin     Incision to Rt hip with steri strips, left lower leg laceration with steri strips, bil unsteageable pressure ulcer. Pt has pravelon booth on left lower leg   Turn and reposition q 2hrs if pt is compliant  No further skin breakdown    Rehab Goals Patient on target to meet rehab goals: Yes *See Care Plan and progress notes for long and short-term goals.    Barriers to Discharge:  see prior      Possible Resolutions to Barriers:    family ed      Discharge  Planning/Teaching Needs:    correction - home with wife to provide primary care 24/7   ongoing    Team Discussion:    Much better participation this week.  Meeting supervision goals except minimal assist with steps. Can move up d/c date as meeting goals quicker   Revisions to Treatment Plan:    Change in d/c date to 09/05/13    Continued Need for Acute Rehabilitation Level of Care: The patient requires daily medical management by a physician with specialized training in physical medicine and rehabilitation for the following conditions: Daily direction of a multidisciplinary physical rehabilitation program to ensure safe treatment while eliciting the highest outcome that is of practical value to the patient.: Yes Daily medical management of patient stability for increased activity during participation in an intensive rehabilitation regime.: Yes Daily analysis of laboratory values and/or radiology reports with any subsequent need for medication adjustment of medical intervention for : Post surgical problems;Other (pain control)  Timothy [name] 09/03/2013, 9:59 AM

## 2013-09-03 NOTE — Progress Notes (Signed)
Subjective/Complaints: No complaints. Excited to be going home.    A 12 point review of systems has been performed and if not noted above is otherwise negative.   Objective:  Vital Signs: Blood pressure 119/63, pulse 65, temperature 98.1 F (36.7 C), temperature source Oral, resp. rate 18, height 5\' 10"  (1.778 m), weight 75.8 kg (167 lb 1.7 oz), SpO2 98.00%. No results found.  Recent Labs  09/02/13 0500 09/03/13 0425  WBC 7.4 6.4  HGB 8.5* 8.6*  HCT 25.8* 25.7*  PLT 326 318    Recent Labs  09/02/13 0500  NA 132*  K 4.0  CL 99  GLUCOSE 101*  BUN 25*  CREATININE 1.01  CALCIUM 8.2*   CBG (last 3)  No results found for this basename: GLUCAP,  in the last 72 hours  Wt Readings from Last 3 Encounters:  09/03/13 75.8 kg (167 lb 1.7 oz)  08/19/13 79.9 kg (176 lb 2.4 oz)  08/19/13 79.9 kg (176 lb 2.4 oz)    Physical Exam:  General: No acute distress  Mood and affect are appropriate  Heart: Regular rate and rhythm no rubs murmurs or extra sounds  Lungs: Clear to auscultation, breathing unlabored, no rales or wheezes  Abdomen: sluggish bowel sounds, soft nontender to palpation, distended  Extremities: No clubbing, cyanosis, or edema, Left distal leg wound draining serosang, R prox hi[ wound draining serosang no erythema  Skin: right hip clean, intact without drainage, lac left ankle with no dc Neurologic: Cranial nerves II through XII intact, motor strength is 5/5 in bilateral deltoid, bicep, tricep, grip, ,5/5 Lefthip flexor KE and 1/5 RHF, knee extensors, 4/5 Bilateral ankle dorsiflexor and plantar flexor  Sensory exam normal sensation to light touch and proprioception in bilateral upper and lower extremities  Cerebellar exam normal finger to nose to finger as well as heel to shin in bilateral upper and lower extremities  Musculoskeletal: Decreased range of motion RLE. Left quad, anterior thigh sore with palpation and flexion of knee, hip flexion.   Assessment/Plan: 1.  Functional deficits secondary to right intertrochanteric hip fx which require 3+ hours per day of interdisciplinary therapy in a comprehensive inpatient rehab setting. Physiatrist is providing close team supervision and 24 hour management of active medical problems listed below. Physiatrist and rehab team continue to assess barriers to discharge/monitor patient progress toward functional and medical goals. FIM: FIM - Bathing Bathing Steps Patient Completed: Chest;Right Arm;Left Arm;Abdomen;Front perineal area;Buttocks;Right upper leg;Left upper leg;Right lower leg (including foot);Left lower leg (including foot) Bathing: 4: Steadying assist  FIM - Upper Body Dressing/Undressing Upper body dressing/undressing steps patient completed: Thread/unthread right sleeve of pullover shirt/dresss;Thread/unthread left sleeve of pullover shirt/dress;Put head through opening of pull over shirt/dress;Pull shirt over trunk Upper body dressing/undressing: 5: Set-up assist to: Obtain clothing/put away FIM - Lower Body Dressing/Undressing Lower body dressing/undressing steps patient completed: Thread/unthread right pants leg;Thread/unthread left pants leg;Pull pants up/down;Don/Doff right shoe;Don/Doff left shoe Lower body dressing/undressing: 3: Mod-Patient completed 50-74% of tasks  FIM - Toileting Toileting steps completed by patient: Performs perineal hygiene Toileting: 2: Max-Patient completed 1 of 3 steps  FIM - Diplomatic Services operational officer Devices: Grab bars Toilet Transfers: 2-To toilet/BSC: Max A (lift and lower assist)  FIM - Banker Devices: Therapist, occupational: 5: Sit > Supine: Supervision (verbal cues/safety issues);5: Chair or W/C > Bed: Supervision (verbal cues/safety issues)  FIM - Locomotion: Wheelchair Locomotion: Wheelchair: 5: Travels 50 - 149 ft, turns around, maneuvers to table, bed or  toilet, negotiates 3% grade: modified  independent FIM - Locomotion: Ambulation Locomotion: Ambulation Assistive Devices: Walker - Rolling Ambulation/Gait Assistance: 4: Min guard Locomotion: Ambulation: 1: Travels less than 50 ft with minimal assistance (Pt.>75%)  Comprehension Comprehension Mode: Auditory Comprehension: 5-Set-up assist with hearing device  Expression Expression Mode: Verbal Expression: 6-Expresses complex ideas: With extra time/assistive device  Social Interaction Social Interaction: 5-Interacts appropriately 90% of the time - Needs monitoring or encouragement for participation or interaction.  Problem Solving Problem Solving: 6-Solves complex problems: With extra time  Memory Memory: 6-Assistive device: No helper  Medical Problem List and Plan:  1. DVT Prophylaxis/Anticoagulation: Mechanical: Sequential compression devices, below knee Bilateral lower extremities  2. Pain Management: Dilaudid po on chronically---back to q6am  scheduled, change prn to Q4 prn with fair control overall  -he seems to be tolerating activity better overall  -  kpad, muscle rub for left thigh--  -likely radicular component to leg pain. He doesn't want to increase gabapentin due to cost.    3. Mood: Monitor mood, pt is discouraged  4. Neuropsych: This patient is capable of making decisions on his own behalf.  5. Multiple myeloma, f/u Dr Darnelle Catalan as outpt.  6. ABLA, S/P transfusion, hgb 8.6---(stable to slightly improved)  -iron supp 7. HTN: Zestril, coreg  8. Renal insufficiency-bun,cr continue to slowly improve---continue to encourage fluids 9. BPH Flomax , voiding,  10. Constipation--scheduled senokot s.  Prn miralax 11. Wounds- staples all out--may shower. allevyn over left leg  LOS (Days) 13 A FACE TO FACE EVALUATION WAS PERFORMED  SWARTZ,ZACHARY T 09/03/2013 9:48 AM

## 2013-09-03 NOTE — Progress Notes (Signed)
Physical Therapy Session Note  Patient Details  Name: Timothy [name] MRN: [id number] Date of Birth: Jul 29, 1928  Today's Date: 09/03/2013 Time: 0865-7846 Time Calculation (min): 28 min  Short Term Goals: Week 2:  PT Short Term Goal 1 (Week 2): = LTGs  Skilled Therapeutic Interventions/Progress Updates:    Session focused on functional strengthening and endurance completing seated and standing LE therex and w/c propulsion on unit. Pt completed 10 reps x 2 set each of standing hip abduction, hip extension, hamstring curls, and marches and seated LAQ with 5 second hold. Sit to stands completed with S.  Therapy Documentation Precautions:  Precautions Precautions: Fall Restrictions Weight Bearing Restrictions: Yes RLE Weight Bearing: Weight bearing as tolerated  Pain:  Pain in R hip with mobility - rest breaks as needed.  See FIM for current functional status  Therapy/Group: Individual Therapy  [name] [facility] 09/03/2013, 4:12 PM

## 2013-09-03 NOTE — Progress Notes (Signed)
Physical Therapy Session Note  Patient Details  Name: Timothy [name] MRN: [id number] Date of Birth: June 18, 1928  Today's Date: 09/03/2013 Time: 4098-1191 Time Calculation (min): 57 min  Short Term Goals: Week 2:  PT Short Term Goal 1 (Week 2): = LTGs  Skilled Therapeutic Interventions/Progress Updates:    Session focused on w/c propulsion for endurance and strengthening, dynamic gait training to simulate home environment including stepping over simualted threshold (min A), curb step negotiation (4" step) with RW with min A x 4 reps, and Nustep on level 4 x 5 min for ROM/strengthening. Returned to bed end of session with S using RW and flat surface/no rails.  Therapy Documentation Precautions:  Precautions Precautions: Fall Restrictions Weight Bearing Restrictions: Yes RLE Weight Bearing: Weight bearing as tolerated  Pain: Premedicated for pain in RLE - rest breaks as needed.  See FIM for current functional status  Therapy/Group: Individual Therapy  [name] [facility] 09/03/2013, 9:27 AM

## 2013-09-03 NOTE — Progress Notes (Signed)
Social Work Patient ID: Timothy [name], male   DOB: 22-Dec-1927, 79 y.o.   MRN: [id number]   Met with patient and wife following team conference yesterday to review info.  Both very pleased with progress made this week and agreeable with change of d/c date to 09/05/13.  Wife plans to attend family education on Thursday from 9-11.  Will complete d/c referrals.  Piper Albro, LCSW

## 2013-09-04 ENCOUNTER — Inpatient Hospital Stay (HOSPITAL_COMMUNITY): Payer: Medicare Other

## 2013-09-04 ENCOUNTER — Inpatient Hospital Stay (HOSPITAL_COMMUNITY): Payer: Medicare Other | Admitting: Occupational Therapy

## 2013-09-04 NOTE — Discharge Summary (Signed)
Physician Discharge Summary  Patient ID: Timothy [name] MRN: [id number] DOB/AGE: Feb 05, 1928 79 y.o.  Admit date: 08/21/2013 Discharge date: 09/05/2013  Discharge Diagnoses:  Principal Problem:   Intertrochanteric fracture of right hip Active Problems:   Hypertension   Renal insufficiency   Systolic CHF, chronic   Hyponatremia   Acute blood loss anemia   Discharged Condition:  Improved.  Significant Diagnostic Studies: N/A   Labs:  Basic Metabolic Panel:  Recent Labs Lab 09/02/13 0500  NA 132*  K 4.0  CL 99  CO2 24  GLUCOSE 101*  BUN 25*  CREATININE 1.01  CALCIUM 8.2*    CBC:  Recent Labs Lab 09/02/13 0500 09/03/13 0425  WBC 7.4 6.4  HGB 8.5* 8.6*  HCT 25.8* 25.7*  MCV 88.1 88.6  PLT 326 318    CBG: No results found for this basename: GLUCAP,  in the last 168 hours  Brief HPI:   HPI: Timothy [name] is a 79 y.o. male with history of CAD, peripheral neuropathy, MM, lumbago; who fell while getting off his lawnmower on 08/16/13, landed on cement with resultant right hip pain and inability to walk.  Xrays with right IT hip fracture with lesser trochanter as separate piece and he underwent right trochanteric nail with pin lag screw by Dr. Ophelia Charter on 08/18/13. Post op with transfused for ABLAas hgb 6.7 and noted to have worsening of chronic hyponatremia. Due to impairments in mobility and self care CIR was recommended by therapy team.     Hospital Course: Timothy [name] was admitted to rehab 08/21/2013 for inpatient therapies to consist of PT, ST and OT at least three hours five days a week. Past admission physiatrist, therapy team and rehab RN have worked together to provide customized collaborative inpatient rehab.  He was limited by pain initially and dilaudid was increased to 6 mg and scheduled prior to therapy sessions to help with participation in therapy. His motivation and ability to work thorough pain has improved with ego support and time. Right hip incision  has healed well without signs or symptoms of infection. Staples were discontinued without difficulty. Po intake has been good. He has had good results with set up of bowel program to help with narcotic induced constipation. Routine check of lytes show hyponatremia to be resolving. H/H has been stable overall and follow up labs to be checked at cancer center next week. He has made great progress and is at supervision level for mobility. He will continue to receive HHPT and HHOT via [facility] past discharge.    Rehab course: During patient's stay in rehab weekly team conferences were held to monitor patient's progress, set goals and discuss barriers to discharge. Patient has had improvement in activity tolerance, balance, strength range of motion as well as functional use of RLE.  He is able to complete most ADL tasks with supervision and increased time. He requires moderate assist for lower body dressing. He's independent for transfers and is able to ambulate short distances with rolling walker. Family education was done with wife who will provide assistance needed past discharge.    Disposition: 01-Home or Self Care  Diet: Regular.   Special Instructions: 1. Aspirin 325 mg twice a day for two weeks for DVT prophylaxis. Then reduce to once daily as at home. 2.  Contact MD for drainage from incision, fevers, chills or redness.        Future Appointments Provider Department Dept Phone   09/10/2013 11:00 AM [facility] [phone]   09/17/2013 11:00 AM [name], MD [facility] MEDICAL ONCOLOGY [phone]       Medication List    STOP taking these medications       oxyCODONE-acetaminophen 5-325 MG per tablet  Commonly known as:  ROXICET      TAKE these medications       allopurinol 300 MG tablet  Commonly known as:  ZYLOPRIM  Take 0.5 tablets (150 mg total) by mouth daily.     aspirin 325 MG tablet   Commonly known as:  BAYER ASPIRIN  Take 1 tablet (325 mg total) by mouth daily.     atorvastatin 10 MG tablet  Commonly known as:  LIPITOR  Take 1 tablet (10 mg total) by mouth daily.     B COMPLETE PO  Take 0.5 tablets by mouth 2 (two) times daily.     calcium carbonate 500 MG chewable tablet  Commonly known as:  TUMS - dosed in mg elemental calcium  Chew 1 tablet (200 mg of elemental calcium total) by mouth 2 (two) times daily with a meal.     carvedilol 6.25 MG tablet  Commonly known as:  COREG  Take 6.25 mg by mouth 2 (two) times daily with a meal.     CO Q-10 PO  Take 1 tablet by mouth every morning.     DULoxetine 30 MG capsule  Commonly known as:  CYMBALTA  Take 30 mg by mouth every evening.     HYDROmorphone 4 MG tablet  Commonly known as:  DILAUDID  Take 1.5 tablets (6 mg total) by mouth every 6 (six) hours as needed for pain.     lisinopril 20 MG tablet  Commonly known as:  PRINIVIL,ZESTRIL  Take 20 mg by mouth every evening.     methocarbamol 500 MG tablet  Commonly known as:  ROBAXIN  - Use one pill four times a day for muscle spasms.    - In a week decrease to three times a day and then further as tolerated.     multivitamin capsule  Take 1 capsule by mouth every morning.     nitroGLYCERIN 0.4 MG SL tablet  Commonly known as:  NITROSTAT  Place 0.4 mg under the tongue every 5 (five) minutes as needed for chest pain.     polyethylene glycol packet  Commonly known as:  MIRALAX / GLYCOLAX  Take 17 g by mouth daily as needed.     tamsulosin 0.4 MG Caps capsule  Commonly known as:  FLOMAX  Take 0.4 mg by mouth at bedtime.     vitamin C 500 MG tablet  Commonly known as:  ASCORBIC ACID  Take 500 mg by mouth every morning.       Follow-up Information   Call Ranelle Oyster, MD. (As needed)    Specialty:  Physical Medicine and Rehabilitation   Contact information:   [address], [address] [place] [address] [phone]       Follow up  with Eldred Manges, MD. Call today. (for post op check in 2 weeks)    Specialty:  Orthopedic Surgery   Contact information:   [address] [place] [address] [phone]       Call [name], MD. (for post hospital check in a couple of weeks. )    Specialty:  Oncology   Contact information:   [address] [place] [address] [phone]  SignedJacquelynn Cree 09/05/2013, 2:38 PM

## 2013-09-04 NOTE — Progress Notes (Signed)
Occupational Therapy Discharge Summary  Patient Details  Name: Timothy [name] MRN: [id number] Date of Birth: 21-Sep-1928  Today's Date: 09/04/2013  Patient has met 12 of 12 long term goals due to improved activity tolerance, improved balance, postural control, ability to compensate for deficits, functional use of  RIGHT lower and LEFT lower extremity, improved attention, improved awareness and improved coordination. Pt made steady progress over the past week with bathing, dressing, toilet transfers and toileiting, and simulated shower transfers.  Pt declined to shower during this admission and completed all bathing and dressing tasks with sit<>stand from w/c at sink.  Pt requires extra time to complete all tasks and continues to require min verbal cues for safety awareness.  Pt's wife has been present for family education and has actively participated providing the appropriate level of assistance and supervision.   Patient to discharge at overall Supervision level.   Recommendation:  Patient will benefit from ongoing skilled OT services in home health setting to continue to advance functional skills in the area of BADL, iADL and Reduce care partner burden.  Equipment: Pt already owns [facility] and tub bench  Reasons for discharge: treatment goals met and discharge from hospital  Patient/family agrees with progress made and goals achieved: Yes  Precautions/Restrictions  Precautions Precautions: Fall Restrictions Weight Bearing Restrictions: Yes RLE Weight Bearing: Weight bearing as tolerated  Pain Pain Assessment Pain Assessment: No/denies pain Pain Score: 1  Pain Type: Acute pain Pain Location: Hip Pain Orientation: Right Pain Descriptors / Indicators: Aching Pain Frequency: Constant Pain Onset: On-going Patients Stated Pain Goal: 2 Pain Intervention(s): Medication (See eMAR) (dilaudid 6mg  po scheduled- prior to therapy)  ADL ADL Equipment Provided: Reacher;Long-handled  sponge Eating: Independent Grooming: Independent Upper Body Bathing: Supervision/safety Lower Body Bathing: Supervision/safety Upper Body Dressing: Supervision/safety Lower Body Dressing: Moderate assistance Toileting: Supervision/safety Toilet Transfer: Close supervision Acupuncturist: Acupuncturist: IT consultant with back  Vision/Perception  Vision - History Baseline Vision: Wears glasses only for reading Visual History: Macular degeneration Patient Visual Report: No change from baseline Perception Perception: Within Functional Limits Praxis Praxis: Intact   Cognition Overall Cognitive Status: Within Functional Limits for tasks assessed Arousal/Alertness: Awake/alert Orientation Level: Oriented X4 Memory: Appears intact Awareness: Appears intact Problem Solving: Appears intact Safety/Judgment: Appears intact  Sensation Sensation Light Touch: Appears Intact Stereognosis: Appears Intact Hot/Cold: Appears Intact Proprioception: Appears Intact Additional Comments: Patient with complaints of neuropathy which impact his ability to complete fine motor tasks (pre-morbid status) Coordination Gross Motor Movements are Fluid and Coordinated: Yes Fine Motor Movements are Fluid and Coordinated: Yes  Motor  Motor Motor: Within Functional Limits  Trunk/Postural Assessment  Cervical Assessment Cervical Assessment: Within Functional Limits Thoracic Assessment Thoracic Assessment: Within Functional Limits Lumbar Assessment Lumbar Assessment: Within Functional Limits Postural Control Postural Control: Within Functional Limits   Balance Static Sitting Balance Static Sitting - Level of Assistance: 7: Independent Dynamic Sitting Balance Dynamic Sitting - Level of Assistance: 7: Independent Static Standing Balance Static Standing - Level of Assistance: 5: Stand by assistance Dynamic Standing  Balance Dynamic Standing - Level of Assistance: 5: Stand by assistance  Extremity/Trunk Assessment RUE Assessment RUE Assessment: Within Functional Limits LUE Assessment LUE Assessment: Within Functional Limits  See FIM for current functional status  Rich Brave 09/04/2013, 11:31 AM

## 2013-09-04 NOTE — Progress Notes (Signed)
Physical Therapy Discharge Summary  Patient Details  Name: Timothy [name] MRN: [id number] Date of Birth: [birth year]  Today's Date: 09/04/2013  Session #1 Time: 1000-1054 Time Calculation (min): 54 min Individual therapy; Premedicated but pain in RLE throughout session. Session focused on family education with patient and patient's wife in regards to car transfer training, bed mobility in ADL apartment, short distance ambulation, reviewed HEP, w/c parts management and mobility, and curb step negotiation with RW. Pt with increased pain today in RLE requiring multiple rest breaks and difficulty with curb step. Pt's wife able to return demonstrate successfully and states she feels prepared for d/c. Discussed safety recommendations and household set up as well.  Session #2 Time: 1445-1510 (25 min) Individual therapy; Session focused on gait training in hallway with about 20-30' trials with seated breaks in between the length of the hallway and back with close S. Increased difficulty with WB through RLE due to RLE pain this afternoon.   Patient has met 11 of 11 long term goals due to improved activity tolerance, improved balance, increased strength, increased range of motion, decreased pain, ability to compensate for deficits and functional use of  right lower extremity.  Patient to discharge at an ambulatory level Supervision for short distances with RW and mod I for w/c propulsion.  Patient's wife is independent to provide the necessary physical and supervision assistance at discharge. She successfully completed family education.   Reasons goals not met: n/a all goals met.  Recommendation:  Patient will benefit from ongoing skilled PT services in home health setting to continue to advance safe functional mobility, address ongoing impairments in gait, ROM, strength, balance, endurance, and minimize fall risk.  Equipment: 18x18 w/c with basic cushion and leg rests. RW  Reasons for discharge:  treatment goals met and discharge from hospital  Patient/family agrees with progress made and goals achieved: Yes  PT Discharge Precautions/Restrictions Precautions Precautions: Fall Restrictions Weight Bearing Restrictions: Yes RLE Weight Bearing: Weight bearing as tolerated Vision/Perception  Vision - History Baseline Vision: Wears glasses only for reading Visual History: Macular degeneration Patient Visual Report: No change from baseline Perception Perception: Within Functional Limits Praxis Praxis: Intact  Cognition Overall Cognitive Status: Within Functional Limits for tasks assessed Arousal/Alertness: Awake/alert Orientation Level: Oriented X4 Memory: Appears intact Awareness: Appears intact Problem Solving: Appears intact Safety/Judgment: Appears intact Sensation Sensation Light Touch: Appears Intact Stereognosis: Appears Intact Hot/Cold: Appears Intact Proprioception: Appears Intact Additional Comments: Patient with complaints of neuropathy which impact his ability to complete fine motor tasks (pre-morbid status) Coordination Gross Motor Movements are Fluid and Coordinated: Yes Fine Motor Movements are Fluid and Coordinated: Yes Motor  Motor Motor: Within Functional Limits  Locomotion  Ambulation Ambulation/Gait Assistance: 5: Supervision  Trunk/Postural Assessment  Cervical Assessment Cervical Assessment: Within Functional Limits Thoracic Assessment Thoracic Assessment: Within Functional Limits Lumbar Assessment Lumbar Assessment: Within Functional Limits Postural Control Postural Control: Within Functional Limits  Balance Static Sitting Balance Static Sitting - Level of Assistance: 7: Independent Dynamic Sitting Balance Dynamic Sitting - Level of Assistance: 7: Independent Static Standing Balance Static Standing - Level of Assistance: 5: Stand by assistance Dynamic Standing Balance Dynamic Standing - Level of Assistance: 5: Stand by  assistance Extremity Assessment  RUE Assessment RUE Assessment: Within Functional Limits LUE Assessment LUE Assessment: Within Functional Limits RLE Assessment RLE Assessment: Exceptions to [facility] RLE Strength RLE Overall Strength Comments: 3-/5 grossly; limited by pain LLE Assessment LLE Assessment: Exceptions to [facility] LLE Strength LLE Overall Strength Comments: grossly 4/5  See FIM for current functional status  [name] [facility] 09/04/2013, 4:11 PM

## 2013-09-04 NOTE — Progress Notes (Signed)
Subjective/Complaints: Had a bm this morning---feels "relieved".    A 12 point review of systems has been performed and if not noted above is otherwise negative.   Objective:  Vital Signs: Blood pressure 136/70, pulse 73, temperature 98.6 F (37 C), temperature source Oral, resp. rate 18, height 5\' 10"  (1.778 m), weight 78.7 kg (173 lb 8 oz), SpO2 98.00%. No results found.  Recent Labs  09/02/13 0500 09/03/13 0425  WBC 7.4 6.4  HGB 8.5* 8.6*  HCT 25.8* 25.7*  PLT 326 318    Recent Labs  09/02/13 0500  NA 132*  K 4.0  CL 99  GLUCOSE 101*  BUN 25*  CREATININE 1.01  CALCIUM 8.2*   CBG (last 3)  No results found for this basename: GLUCAP,  in the last 72 hours  Wt Readings from Last 3 Encounters:  09/04/13 78.7 kg (173 lb 8 oz)  08/19/13 79.9 kg (176 lb 2.4 oz)  08/19/13 79.9 kg (176 lb 2.4 oz)    Physical Exam:  General: No acute distress  Mood and affect are appropriate  Heart: Regular rate and rhythm no rubs murmurs or extra sounds  Lungs: Clear to auscultation, breathing unlabored, no rales or wheezes  Abdomen: sluggish bowel sounds, soft nontender to palpation, distended  Extremities: No clubbing, cyanosis, or edema, Left distal leg wound draining serosang, R prox hi[ wound draining serosang no erythema  Skin: right hip clean, intact without drainage, lac left ankle with no dc, allevyn in place Neurologic: Cranial nerves II through XII intact, motor strength is 5/5 in bilateral deltoid, bicep, tricep, grip, ,5/5 Lefthip flexor KE and 1/5 RHF, knee extensors, 4/5 Bilateral ankle dorsiflexor and plantar flexor  Sensory exam normal sensation to light touch and proprioception in bilateral upper and lower extremities  Cerebellar exam normal finger to nose to finger as well as heel to shin in bilateral upper and lower extremities  Musculoskeletal: Decreased range of motion RLE.  Still tender at hip to touch. Surrounding edema noted, some bruising  Assessment/Plan: 1.  Functional deficits secondary to right intertrochanteric hip fx which require 3+ hours per day of interdisciplinary therapy in a comprehensive inpatient rehab setting. Physiatrist is providing close team supervision and 24 hour management of active medical problems listed below. Physiatrist and rehab team continue to assess barriers to discharge/monitor patient progress toward functional and medical goals.  Finalize dc planning for tomorrow   FIM: FIM - Bathing Bathing Steps Patient Completed: Chest;Right Arm;Left Arm;Abdomen;Front perineal area;Buttocks;Right upper leg;Left upper leg;Right lower leg (including foot);Left lower leg (including foot) Bathing: 4: Steadying assist  FIM - Upper Body Dressing/Undressing Upper body dressing/undressing steps patient completed: Thread/unthread right sleeve of pullover shirt/dresss;Thread/unthread left sleeve of pullover shirt/dress;Put head through opening of pull over shirt/dress;Pull shirt over trunk Upper body dressing/undressing: 5: Set-up assist to: Obtain clothing/put away FIM - Lower Body Dressing/Undressing Lower body dressing/undressing steps patient completed: Thread/unthread right pants leg;Thread/unthread left pants leg;Pull pants up/down;Don/Doff right shoe;Don/Doff left shoe Lower body dressing/undressing: 3: Mod-Patient completed 50-74% of tasks  FIM - Toileting Toileting steps completed by patient: Adjust clothing prior to toileting;Performs perineal hygiene;Adjust clothing after toileting Toileting: 5: Supervision: Safety issues/verbal cues  FIM - Diplomatic Services operational officer Devices: Elevated toilet seat Toilet Transfers: 5-To toilet/BSC: Supervision (verbal cues/safety issues)  FIM - Banker Devices: Therapist, occupational: 5: Sit > Supine: Supervision (verbal cues/safety issues);5: Chair or W/C > Bed: Supervision (verbal cues/safety issues)  FIM - Locomotion:  Wheelchair Locomotion: Wheelchair:  5: Travels 50 - 149 ft, turns around, maneuvers to table, bed or toilet, negotiates 3% grade: modified independent FIM - Locomotion: Ambulation Locomotion: Ambulation Assistive Devices: Designer, industrial/product Ambulation/Gait Assistance: 4: Min guard Locomotion: Ambulation: 1: Travels less than 50 ft with minimal assistance (Pt.>75%)  Comprehension Comprehension Mode: Asleep Comprehension: 5-Follows basic conversation/direction: With no assist  Expression Expression Mode: Asleep Expression: 6-Expresses complex ideas: With extra time/assistive device  Social Interaction Social Interaction Mode: Asleep Social Interaction: 6-Interacts appropriately with others with medication or extra time (anti-anxiety, antidepressant).  Problem Solving Problem Solving Mode: Asleep Problem Solving: 6-Solves complex problems: With extra time  Memory Memory Mode: Asleep Memory: 6-Assistive device: No helper  Medical Problem List and Plan:  1. DVT Prophylaxis/Anticoagulation: Mechanical: Sequential compression devices, below knee Bilateral lower extremities  2. Pain Management: Dilaudid po on chronically---back to q6am  scheduled, change prn to Q4 prn with fair control overall  -he seems to be tolerating activity better overall  -  kpad, muscle rub for left thigh--  -likely radicular component to leg pain. He doesn't want to increase gabapentin due to cost.    3. Mood: Monitor mood, pt is discouraged  4. Neuropsych: This patient is capable of making decisions on his own behalf.  5. Multiple myeloma, f/u Dr Darnelle Catalan as outpt.  6. ABLA, S/P transfusion, hgb 8.6---(stable to slightly improved)  -iron supp 7. HTN: Zestril, coreg  8. Renal insufficiency-bun,cr continue to slowly improve---continue to encourage fluids 9. BPH Flomax , voiding,  10. Constipation--scheduled senokot s.  Prn miralax 11. Wounds- staples all out--may shower. allevyn over left leg  LOS (Days)  14 A FACE TO FACE EVALUATION WAS PERFORMED  SWARTZ,ZACHARY T 09/04/2013 8:26 AM

## 2013-09-04 NOTE — Progress Notes (Signed)
Occupational Therapy Session Note  Patient Details  Name: Timothy [name] MRN: [id number] Date of Birth: 1928/07/09  Today's Date: 09/04/2013 Time: 4098-1191 Time Calculation (min): 45 min  Short Term Goals: Week 1:  OT Short Term Goal 1 (Week 1): Patient will perform sit<>stands with moderate assistance in order to complete LB ADLs more independently OT Short Term Goal 1 - Progress (Week 1): Met OT Short Term Goal 2 (Week 1): Patient will perform UB/LB bathing with moderate assistance OT Short Term Goal 2 - Progress (Week 1): Met OT Short Term Goal 3 (Week 1): Patient will perform LB dressing with moderate assistance using AE prn OT Short Term Goal 3 - Progress (Week 1): Progressing toward goal OT Short Term Goal 4 (Week 1): Patient will perform toilet transfer with moderate assistance OT Short Term Goal 4 - Progress (Week 1): Met OT Short Term Goal 5 (Week 1): Patient will be educated on an UE HEP to improve overall strength & endurance in order to increase overall independence with functional mobility/transfers and ADLs OT Short Term Goal 5 - Progress (Week 1): Met  Week 2:  OT Short Term Goal 1 (Week 2): Pt will perform sit<>stand with min A during bathing and dressing tasks OT Short Term Goal 2 (Week 2): Pt will perform LB dressing with moderate assistance using AE PRN OT Short Term Goal 3 (Week 2): Pt will perform toilet transfer with min A OT Short Term Goal 4 (Week 2): Pt will perform toileting tasks with min A  Skilled Therapeutic Interventions/Progress Updates:  Patient found in bathroom with wife assisting. After toileting, patient propelled self -> therapy gym. Patient remained seated in w/c and performed BUE HEP using dumbbells and theraband. Wife present during session for education regarding HEP. Wife assisted patient back to room at end of session.   Precautions:  Precautions Precautions: Fall Restrictions Weight Bearing Restrictions: Yes RLE Weight Bearing: Weight  bearing as tolerated  See FIM for current functional status  Therapy/Group: Individual Therapy  Jakhiya Brower 09/04/2013, 2:39 PM

## 2013-09-04 NOTE — Progress Notes (Signed)
Occupational Therapy Session Note  Patient Details  Name: Timothy [name] MRN: [id number] Date of Birth: 26-Jan-1928  Today's Date: 09/04/2013 Time: 0900-0955 Time Calculation (min): 55 min  Short Term Goals: Week 2:  OT Short Term Goal 1 (Week 2): Pt will perform sit<>stand with min A during bathing and dressing tasks OT Short Term Goal 2 (Week 2): Pt will perform LB dressing with moderate assistance using AE PRN OT Short Term Goal 3 (Week 2): Pt will perform toilet transfer with min A OT Short Term Goal 4 (Week 2): Pt will perform toileting tasks with min A  Skilled Therapeutic Interventions/Progress Updates:    Pt seated EOB with wife present upon arrival.  Pt's wife present throughout session and actively participated with assisting patient appropriately during bathing and dressing w/c level at sink, toilet transfers and toileting, and simulated shower transfer.  Pt and wife both stated that they were pleased with patient's progress during this admission and felt comfortable with discharge tomorrow.  Pt required assistance donning socks and shoes with wife providing assistance.  Pt's wife stated she had been doing that for him for years.  Focus on family education, transfers, functional amb with RW to access bathroom and shower, and safety awareness.  Therapy Documentation Precautions:  Precautions Precautions: Fall Restrictions Weight Bearing Restrictions: Yes RLE Weight Bearing: Weight bearing as tolerated   Pain: Pain Assessment Pain Assessment: No/denies pain Pain Score: 1  Pain Type: Acute pain Pain Location: Hip Pain Orientation: Right Pain Descriptors / Indicators: Aching Pain Frequency: Constant Pain Onset: On-going Patients Stated Pain Goal: 2 Pain Intervention(s): Medication (See eMAR) (dilaudid 6mg  po scheduled- prior to therapy)  See FIM for current functional status  Therapy/Group: Individual Therapy  Rich Brave 09/04/2013, 10:02 AM

## 2013-09-05 MED ORDER — POLYETHYLENE GLYCOL 3350 17 G PO PACK: 17.0000 g | PACK | Freq: Every day | ORAL | Status: AC | PRN

## 2013-09-05 MED ORDER — HYDROMORPHONE HCL 4 MG PO TABS: 6.0000 mg | ORAL_TABLET | Freq: Four times a day (QID) | ORAL | Status: DC | PRN

## 2013-09-05 MED ORDER — METHOCARBAMOL 500 MG PO TABS: ORAL_TABLET | ORAL | Status: DC

## 2013-09-05 NOTE — Progress Notes (Signed)
Discharged to home accompanied by wife. Discharge info reviewed by Marissa Nestle PAC no questions noted. Timothy [name]

## 2013-09-05 NOTE — Plan of Care (Signed)
Problem: RH SKIN INTEGRITY Goal: RH STG MAINTAIN SKIN INTEGRITY WITH ASSISTANCE STG Maintain Skin Integrity With mod Assistance.  Outcome: Progressing Pt has unstagable areas to both heels.

## 2013-09-05 NOTE — Progress Notes (Signed)
Subjective/Complaints: No new issues. Excited to be going home today.    A 12 point review of systems has been performed and if not noted above is otherwise negative.   Objective:  Vital Signs: Blood pressure 153/72, pulse 63, temperature 97.9 F (36.6 C), temperature source Oral, resp. rate 16, height 5\' 10"  (1.778 m), weight 78.7 kg (173 lb 8 oz), SpO2 95.00%. No results found.  Recent Labs  09/03/13 0425  WBC 6.4  HGB 8.6*  HCT 25.7*  PLT 318   No results found for this basename: NA, K, CL, CO, GLUCOSE, BUN, CREATININE, CALCIUM,  in the last 72 hours CBG (last 3)  No results found for this basename: GLUCAP,  in the last 72 hours  Wt Readings from Last 3 Encounters:  09/04/13 78.7 kg (173 lb 8 oz)  08/19/13 79.9 kg (176 lb 2.4 oz)  08/19/13 79.9 kg (176 lb 2.4 oz)    Physical Exam:  General: No acute distress  Mood and affect are appropriate  Heart: Regular rate and rhythm no rubs murmurs or extra sounds  Lungs: Clear to auscultation, breathing unlabored, no rales or wheezes  Abdomen: sluggish bowel sounds, soft nontender to palpation, distended  Extremities: No clubbing, cyanosis, or edema, Left distal leg wound draining serosang, R prox hi[ wound draining serosang no erythema  Skin: right hip clean, intact without drainage, lac left ankle with no dc, allevyn in place Neurologic: Cranial nerves II through XII intact, motor strength is 5/5 in bilateral deltoid, bicep, tricep, grip, ,5/5 Lefthip flexor KE and 1/5 RHF, knee extensors, 4/5 Bilateral ankle dorsiflexor and plantar flexor  Sensory exam normal sensation to light touch and proprioception in bilateral upper and lower extremities  Cerebellar exam normal finger to nose to finger as well as heel to shin in bilateral upper and lower extremities  Musculoskeletal: Decreased range of motion RLE.  Still tender at hip to touch. Surrounding edema noted, some bruising  Assessment/Plan: 1. Functional deficits secondary to  right intertrochanteric hip fx which require 3+ hours per day of interdisciplinary therapy in a comprehensive inpatient rehab setting. Physiatrist is providing close team supervision and 24 hour management of active medical problems listed below. Physiatrist and rehab team continue to assess barriers to discharge/monitor patient progress toward functional and medical goals.  Dc home today . Follow up hgb next week   FIM: FIM - Bathing Bathing Steps Patient Completed: Chest;Right Arm;Left Arm;Abdomen;Front perineal area;Buttocks;Right upper leg;Left upper leg;Right lower leg (including foot);Left lower leg (including foot) Bathing: 5: Supervision: Safety issues/verbal cues  FIM - Upper Body Dressing/Undressing Upper body dressing/undressing steps patient completed: Thread/unthread right sleeve of pullover shirt/dresss;Thread/unthread left sleeve of pullover shirt/dress;Put head through opening of pull over shirt/dress;Pull shirt over trunk Upper body dressing/undressing: 5: Set-up assist to: Obtain clothing/put away FIM - Lower Body Dressing/Undressing Lower body dressing/undressing steps patient completed: Thread/unthread right pants leg;Thread/unthread left pants leg;Pull pants up/down;Don/Doff right shoe;Don/Doff left shoe Lower body dressing/undressing: 3: Mod-Patient completed 50-74% of tasks  FIM - Toileting Toileting steps completed by patient: Adjust clothing prior to toileting;Performs perineal hygiene;Adjust clothing after toileting Toileting: 5: Supervision: Safety issues/verbal cues  FIM - Archivist Transfers Assistive Devices: Elevated toilet seat Toilet Transfers: 5-To toilet/BSC: Supervision (verbal cues/safety issues);5-From toilet/BSC: Supervision (verbal cues/safety issues)  FIM - Bed/Chair Transfer Bed/Chair Transfer Assistive Devices: Therapist, occupational: 5: Supine > Sit: Supervision (verbal cues/safety issues);5: Sit > Supine: Supervision (verbal  cues/safety issues);5: Bed > Chair or W/C: Supervision (verbal cues/safety issues);5: Chair  or W/C > Bed: Supervision (verbal cues/safety issues)  FIM - Locomotion: Wheelchair Locomotion: Wheelchair: 6: Travels 150 ft or more, turns around, maneuvers to table, bed or toilet, negotiates 3% grade: maneuvers on rugs and over door sills independently FIM - Locomotion: Ambulation Locomotion: Ambulation Assistive Devices: Designer, industrial/product Ambulation/Gait Assistance: 5: Supervision Locomotion: Ambulation: 1: Travels less than 50 ft with supervision/safety issues  Comprehension Comprehension Mode: Auditory Comprehension: 6-Follows complex conversation/direction: With extra time/assistive device  Expression Expression Mode: Verbal Expression: 6-Expresses complex ideas: With extra time/assistive device  Social Interaction Social Interaction Mode: Asleep Social Interaction: 5-Interacts appropriately 90% of the time - Needs monitoring or encouragement for participation or interaction.  Problem Solving Problem Solving Mode: Asleep Problem Solving: 6-Solves complex problems: With extra time  Memory Memory Mode: Asleep Memory: 6-Assistive device: No helper  Medical Problem List and Plan:  1. DVT Prophylaxis/Anticoagulation: Mechanical: Sequential compression devices, below knee Bilateral lower extremities  2. Pain Management: Dilaudid po on chronically---back to q6am  scheduled, change prn to Q4 prn with fair control overall  -he seems to be tolerating activity better overall  -  kpad, muscle rub for left thigh--  -likely radicular component to leg pain. He doesn't want to increase gabapentin due to cost.    3. Mood: Monitor mood, pt is discouraged  4. Neuropsych: This patient is capable of making decisions on his own behalf.  5. Multiple myeloma, f/u Dr Darnelle Catalan as outpt.  6. ABLA, S/P transfusion, hgb 8.6---(stable to slightly improved)  -iron supp  -HHRN TO CHECK HGB NEXT TUESDAY 7.  HTN: Zestril, coreg  8. Renal insufficiency-bun,cr continue to slowly improve---continue to encourage fluids 9. BPH Flomax , voiding,  10. Constipation--scheduled senokot s.  Prn miralax 11. Wounds- staples all out--may shower. allevyn over left leg  LOS (Days) 15 A FACE TO FACE EVALUATION WAS PERFORMED  Harshith Pursell T 09/05/2013 8:22 AM

## 2013-09-05 NOTE — Progress Notes (Signed)
Social Work  Discharge Note  The overall goal for the admission was met for:   Discharge location: Yes - home with wife to provide 24/7 supervision  Length of Stay: Yes - 15 days  Discharge activity level: Yes - supervision  Home/community participation: Yes  Services provided included: MD, RD, PT, OT, RN, Pharmacy and SW  Financial Services: Medicare and Private Insurance: Community education officer  Follow-up services arranged: Home Health: RN, PT, OT via [place], DME: [phone] lightweight w/c, cushion, rolling walker via [facility] and Patient/Family has no preference for HH/DME agencies  Comments (or additional information):  Patient/Family verbalized understanding of follow-up arrangements: Yes  Individual responsible for coordination of the follow-up plan: patient  Confirmed correct DME delivered: Jahaan Vanwagner 09/05/2013    March Steyer

## 2013-09-09 ENCOUNTER — Telehealth: Payer: Self-pay

## 2013-09-09 NOTE — Telephone Encounter (Signed)
Debbie from [facility] called to inform you that she did not draw a CBC on patient because he is having a full panel drawn on 10/22. Patient did not want to have blood drawn twice. Eunice Blase also would like for you to give her a call to ensure you received this message at [phone].

## 2013-09-10 ENCOUNTER — Other Ambulatory Visit: Payer: Medicare Other

## 2013-09-17 ENCOUNTER — Ambulatory Visit (HOSPITAL_BASED_OUTPATIENT_CLINIC_OR_DEPARTMENT_OTHER): Payer: Medicare Other | Admitting: Oncology

## 2013-09-17 ENCOUNTER — Telehealth: Payer: Self-pay | Admitting: Oncology

## 2013-09-17 VITALS — BP 98/63 | HR 71 | Temp 97.5°F | Resp 17 | Ht 70.0 in

## 2013-09-17 DIAGNOSIS — L97529 Non-pressure chronic ulcer of other part of left foot with unspecified severity: Secondary | ICD-10-CM

## 2013-09-17 DIAGNOSIS — N289 Disorder of kidney and ureter, unspecified: Secondary | ICD-10-CM

## 2013-09-17 DIAGNOSIS — I10 Essential (primary) hypertension: Secondary | ICD-10-CM

## 2013-09-17 DIAGNOSIS — C9000 Multiple myeloma not having achieved remission: Secondary | ICD-10-CM

## 2013-09-17 DIAGNOSIS — I2510 Atherosclerotic heart disease of native coronary artery without angina pectoris: Secondary | ICD-10-CM

## 2013-09-17 DIAGNOSIS — S72141S Displaced intertrochanteric fracture of right femur, sequela: Secondary | ICD-10-CM

## 2013-09-17 DIAGNOSIS — D649 Anemia, unspecified: Secondary | ICD-10-CM

## 2013-09-17 NOTE — Telephone Encounter (Signed)
, °

## 2013-09-17 NOTE — Progress Notes (Signed)
ID: Timothy [name]   DOB: 12-03-1927  MR#: [id number]  JXB#:[id number]  PCP:  SU: OTHER MD: Timothy [name], Timothy [name], Timothy [name]  HISTORY OF PRESENT ILLNESS: Patient presented in April 2007 with symptomatic slowly progressive anemia, with normal iron parameters, folic acid, and vitamin B12. Plans were made to obtain bone marrow aspirate and biopsy as an outpatient, however the patient was hospitalized shortly thereafter with a creatinine of 8.6. Renal biopsy demonstrated the presence of  light chain deposition disease. Bone marrow biopsy demonstrated 37% plasma cells. A skeletal survey showed small calvarial lytic lesions, mild osteopenia, and cervical spondylosis. With a well-established diagnosis of light-chain multiple myeloma Timothy [name] subsequent treatments are as detailed below.   INTERVAL HISTORY: Timothy [name] returns today with his wife, Timothy [name], for followup of his multiple myeloma . "This has been a rough summer". He was doing some yard work late September when he fell, landed on his right hip, and developed a comminuted intertrochanteric hip fracture. He underwent right trochanteric nail placement with proximal and distal interlock screws 08/17/2013, under Timothy [name]. He then participated in inpatient rehabilitation until he was discharged December 17. He is still receiving physical therapy at home under the care of Timothy [name]  REVIEW OF SYSTEMS: He is walking short distances with a walker, but walking is painful. He is using downloaded 1-1/2 tablets twice daily. This does not constipate him. He controls the pain fairly well, but he is still somewhat reluctant to get out of the chair and adopt (according to Timothy [name]". In addition she has macular degeneration problems, which are being followed by Timothy [name]. Timothy [name] has a pressure sore in his left heel, which is improving according to Timothy [name]. They have not been unusual headaches, nausea, vomiting, cough, phlegm  production, pleurisy, or change in bladder habits. A detailed review of systems was otherwise stable   PAST MEDICAL HISTORY: Past Medical History  Diagnosis Date  . ASCVD (arteriosclerotic cardiovascular disease)   . Anemia     chronic mild anemia  . Gout   . Hypertension   . Lumbar disc disease     post laminectomy  . Chronic back pain   . History of echocardiogram 11/02/2010    EF range of 30 to 35% / There is hypokinsesis of the basal-mild inferolateral myocardium  . CHF (congestive heart failure)   . Heart murmur   . Ischemic cardiomyopathy   . Renal insufficiency   . Peripheral neuropathy   . Inferior myocardial infarction 1986  . Hyperlipidemia   . SOB (shortness of breath)   . Fatigue   . Peripheral neuropathy   . Coronary artery disease     CABG 2002 by [name] with LIMA to LAD, SVG to intermediate, SVG to LCX, & SVG to PL. Last nuclear in 2012 showing large inferior scar with EF of 39%.   . Blood transfusion     DEC 2012 - TWO UNITS  . 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  PT HAS BEEN OFF CHEMO SINCE HIS HOSPITALIZATION FEB 2013 FOR LEFT FOOT INFECTION  . Cancer   . History of shingles MARCH 2009    SHINGLE LESIONS WERE AROUND RIGHT EYE--PT HAS RESIDUAL ITCHING AROUND THE EYE.  . Osteomyelitis     s/p toe amputation  . Chronic systolic heart failure     EF of 30% per echo 02/2012  . Protein malnutrition    PAST SURGICAL HISTORY: Past  Surgical History  Procedure Laterality Date  . Laminectomy    . Inguinal hernia repair    . Cardiac catheterization  06/20/2000    Severe coronary disease (totally occluded right artery, 90% left circumflex, 50-60% intermediate, and 90-95% ostial left anterior descending)   . Tonsillectomy    . Coronary artery bypass graft  2002    x4 / Left internal mammary artery to the LAD.  / Saphenous vein graft to the right posterolateral.  /  Saphenous vein graft to  the ramus  intermedius. / Saphenous vein graft to the circumflex marginal     . Amputation  03/06/2012    Procedure: AMPUTATION RAY;  Surgeon: Harvie Junior, MD;  Location: [facility];  Service: Orthopedics;  Laterality: Left;  First Ray Amputation  . 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   ADVANCED DIRECTIVES:  HEALTH MAINTENANCE: History  Substance Use Topics  . Smoking status: Former Smoker -- 1.00 packs/day for 20 years    Types: Cigarettes    Quit date: 11/20/1984  . Smokeless tobacco: Never Used  . Alcohol Use: No     Colonoscopy:  Bone density:  Lipid panel:  Allergies  Allergen Reactions  . Morphine And Related Other (See Comments)    Agitated with morphine drip    Current Outpatient Prescriptions  Medication Sig Dispense Refill  . allopurinol (ZYLOPRIM) 300 MG tablet Take 0.5 tablets (150 mg total) by mouth daily.  90 tablet  0  . aspirin (BAYER ASPIRIN) 325 MG tablet Take 1 tablet (325 mg total) by mouth daily.  60 tablet  0  . atorvastatin (LIPITOR) 10 MG tablet Take 1 tablet (10 mg total) by mouth daily.  90 tablet  3  . B Complex-Biotin-FA (B COMPLETE PO) Take 0.5 tablets by mouth 2 (two) times daily.       . calcium carbonate (TUMS - DOSED IN MG ELEMENTAL CALCIUM) 500 MG chewable tablet Chew 1 tablet (200 mg of elemental calcium total) by mouth 2 (two) times daily with a meal.  60 tablet  1  . carvedilol (COREG) 6.25 MG tablet Take 6.25 mg by mouth 2 (two) times daily with a meal.      . Coenzyme Q10 (CO Q-10 PO) Take 1 tablet by mouth every morning.       . DULoxetine (CYMBALTA) 30 MG capsule Take 30 mg by  mouth every evening.      Marland Kitchen HYDROmorphone (DILAUDID) 4 MG tablet Take 1.5 tablets (6 mg total) by mouth every 6 (six) hours as needed for pain.  120 tablet  0  . lisinopril (PRINIVIL,ZESTRIL) 20 MG tablet Take 20 mg by mouth every evening.      . methocarbamol (ROBAXIN) 500 MG tablet Use one pill four times a day for muscle spasms.   In a week decrease to three times a day and then further as tolerated.  120 tablet  1  . Multiple Vitamin (MULTIVITAMIN) capsule Take 1 capsule by mouth every morning.       . nitroGLYCERIN (NITROSTAT) 0.4 MG SL tablet Place 0.4 mg under the tongue every 5 (five) minutes as needed for chest pain.      Marland Kitchen  polyethylene glycol (MIRALAX / GLYCOLAX) packet Take 17 g by mouth daily as needed.  14 each  0  . tamsulosin (FLOMAX) 0.4 MG CAPS capsule Take 0.4 mg by mouth at bedtime.      . vitamin C (ASCORBIC ACID) 500 MG tablet Take 500 mg by mouth every morning.       No current facility-administered medications for this visit.    OBJECTIVE: Elderly white male who appears stated age 50 Vitals:   09/17/13 1055  BP: 98/63  Pulse: 71  Temp: 97.5 F (36.4 C)  Resp: 17     Body mass index is 0.00 kg/(m^2).    ECOG FS: 2 Filed Weights   Sclerae unicteric Oropharynx clear, slightly dry No peripheral adenopathy Lungs clear bilaterally  Heart regular rate and rhythm Abdomen soft, nontender, positive bowel sounds MSK 5 over 5 flexion on the right, but with pain against pressure. Normal dorsiflexion bilaterally Neuro: nonfocal, well oriented, positive affect   LAB RESULTS:  Bary Akhil results obtained through the home health agency show a 10 fold increase in the M. chains, but also a 10 fold increase in the lambda light chains, suggesting an error in processing. We are going to be repeating these labs in 2 weeks  Lab Results  Component Value Date   WBC 6.4 09/03/2013   NEUTROABS 5.6 08/22/2013   HGB 8.6* 09/03/2013   HCT 25.7* 09/03/2013   MCV 88.6 09/03/2013    PLT 318 09/03/2013      Chemistry      Component Value Date/Time   NA 132* 09/02/2013 0500   NA 131* 08/13/2013 1132   K 4.0 09/02/2013 0500   K 4.7 08/13/2013 1132   CL 99 09/02/2013 0500   CL 98 05/12/2013 1048   CO2 24 09/02/2013 0500   CO2 23 08/13/2013 1132   BUN 25* 09/02/2013 0500   BUN 20.8 08/13/2013 1132   CREATININE 1.01 09/02/2013 0500   CREATININE 1.2 08/13/2013 1132      Component Value Date/Time   CALCIUM 8.2* 09/02/2013 0500   CALCIUM 9.3 08/13/2013 1132   ALKPHOS 40 08/22/2013 0620   ALKPHOS 49 08/13/2013 1132   AST 28 08/22/2013 0620   AST 22 08/13/2013 1132   ALT 22 08/22/2013 0620   ALT 12 08/13/2013 1132   BILITOT 1.2 08/22/2013 0620   BILITOT 0.58 08/13/2013 1132      STUDIES: No results found.  ASSESSMENT: 79 y.o.  with kappa light chain multiple myeloma  (1) presenting April 2007 with anemia and renal failure; renal Bx showing free kappa light chain deposition; bone marrow biopsy showing 37% plasma cells, with normal cytogenetics and FISH;bone survey showing skull lytic lesions; treated with  (2) thalidomide (200 mg/d) and dexamethasone (40 mg/d x4d Q28d) June through Nov 2007, with good response, but poor tolerance;  (3) thalidomide decreased to 100 mg/d, dexamethasone continued, bortezomib (IV) added, Dec 2007 to March 2008  (4) treatment interrupted by multiple complications (peripheral neuropathy, pulmonary embolism, diverticular abscess, CN V zoster, severe DDD, congestive heart failure, rising PSA); maintenance zolendronic acid through March 2012  (5) progression April 2012, treated initially with dexamethasone alone, poorly tolerated;  (6) bortezomib (sq) resumed July 2012; cyclophosphamide and dexamethasone added Sept 2012; zolendronic acid changed to Q 3months; all treatments held as of March 2013 due to the development of the Left foot ulcer described above (7) anemia, on aranesp December 2012 to August 2013 without significant response  (8) status post  Right trochanteric nail with  proximal and distal interlock  DePuy 11 mm one-pin lag screw, 44 mm distal interlock 08/09/2013 for a right comminuted intertrochanteric fracture    PLAN:   We went over his lab work, which shows again anemia, not requiring transfusion at present. I encouraged him to get out of the chair and it up and walk as much as he can, even if only for brief distances, but to do this frequently and persistently. I think he can regain much of his prior functional status if he does that.  As far as myeloma is concerned, I think the lab work we obtained through the outside agency is not reliable. We are going to repeat his lab work again in 2 weeks and then again monthly as before. Assuming all remains stable, he will see me again in January. Otherwise we will consider treatment with one of the newer agents less likely to worsen his peripheral neuropathy.  He M. Benna Dunks know to call for any problems that may develop before the next visit here.    MAGRINAT,GUSTAV C    09/17/2013

## 2013-09-18 ENCOUNTER — Other Ambulatory Visit: Payer: Self-pay | Admitting: *Deleted

## 2013-09-18 MED ORDER — COLLAGENASE 250 UNIT/GM EX OINT: TOPICAL_OINTMENT | Freq: Every day | CUTANEOUS | Status: DC

## 2013-09-18 NOTE — Telephone Encounter (Signed)
This RN spoke with home health nurse, Verlon Au, per her report of 2 areas in need of wound care on lower left extremity. Pt has open wound on left heel and then on leg at site where staple came loose.  Her plan is to dress daily with use of Santyl and gauze. Wife has been instructed on protoccol and taught dressing changes.  Requested prescription for Santyl to be sent to pt's pharmacy.

## 2013-10-01 ENCOUNTER — Other Ambulatory Visit (HOSPITAL_BASED_OUTPATIENT_CLINIC_OR_DEPARTMENT_OTHER): Payer: Medicare Other

## 2013-10-01 DIAGNOSIS — C9000 Multiple myeloma not having achieved remission: Secondary | ICD-10-CM

## 2013-10-01 LAB — COMPREHENSIVE METABOLIC PANEL (CC13)
ALT: 14 U/L (ref 0–55)
AST: 21 U/L (ref 5–34)
Alkaline Phosphatase: 67 U/L (ref 40–150)
Anion Gap: 7 mEq/L (ref 3–11)
CO2: 25 mEq/L (ref 22–29)
Calcium: 9.7 mg/dL (ref 8.4–10.4)
Chloride: 99 mEq/L (ref 98–109)
Creatinine: 1 mg/dL (ref 0.7–1.3)
Potassium: 4.8 mEq/L (ref 3.5–5.1)
Sodium: 131 mEq/L — ABNORMAL LOW (ref 136–145)
Total Protein: 7.1 g/dL (ref 6.4–8.3)

## 2013-10-01 LAB — CBC WITH DIFFERENTIAL/PLATELET
Basophils Absolute: 0.1 10*3/uL (ref 0.0–0.1)
EOS%: 1.6 % (ref 0.0–7.0)
Eosinophils Absolute: 0.1 10*3/uL (ref 0.0–0.5)
HCT: 28.6 % — ABNORMAL LOW (ref 38.4–49.9)
HGB: 9.3 g/dL — ABNORMAL LOW (ref 13.0–17.1)
LYMPH%: 22.5 % (ref 14.0–49.0)
MCH: 28.8 pg (ref 27.2–33.4)
MONO#: 0.8 10*3/uL (ref 0.1–0.9)
NEUT#: 4.8 10*3/uL (ref 1.5–6.5)
NEUT%: 64.3 % (ref 39.0–75.0)
RDW: 16.7 % — ABNORMAL HIGH (ref 11.0–14.6)
WBC: 7.4 10*3/uL (ref 4.0–10.3)
lymph#: 1.7 10*3/uL (ref 0.9–3.3)

## 2013-10-02 LAB — KAPPA/LAMBDA LIGHT CHAINS
Kappa:Lambda Ratio: 18.32 — ABNORMAL HIGH (ref 0.26–1.65)
Lambda Free Lght Chn: 2.56 mg/dL (ref 0.57–2.63)

## 2013-10-06 ENCOUNTER — Telehealth: Payer: Self-pay | Admitting: *Deleted

## 2013-10-06 NOTE — Telephone Encounter (Signed)
sw pt informed the pt that Dr. Gayland Curry will be in [facility] during the PM on 12/03/13.gv appt for 12/03/13 @ 12 noon. Pt is aware...td

## 2013-10-24 ENCOUNTER — Other Ambulatory Visit: Payer: Self-pay | Admitting: Cardiology

## 2013-10-28 ENCOUNTER — Other Ambulatory Visit: Payer: Self-pay | Admitting: *Deleted

## 2013-10-28 ENCOUNTER — Other Ambulatory Visit: Payer: Self-pay | Admitting: Oncology

## 2013-10-28 DIAGNOSIS — C9000 Multiple myeloma not having achieved remission: Secondary | ICD-10-CM

## 2013-10-29 ENCOUNTER — Other Ambulatory Visit (HOSPITAL_BASED_OUTPATIENT_CLINIC_OR_DEPARTMENT_OTHER): Payer: Medicare Other

## 2013-10-29 DIAGNOSIS — C9000 Multiple myeloma not having achieved remission: Secondary | ICD-10-CM

## 2013-10-29 LAB — COMPREHENSIVE METABOLIC PANEL (CC13)
Albumin: 3.5 g/dL (ref 3.5–5.0)
Alkaline Phosphatase: 61 U/L (ref 40–150)
BUN: 19.8 mg/dL (ref 7.0–26.0)
CO2: 23 mEq/L (ref 22–29)
Calcium: 9.5 mg/dL (ref 8.4–10.4)
Chloride: 101 mEq/L (ref 98–109)
Glucose: 127 mg/dl (ref 70–140)
Potassium: 4.8 mEq/L (ref 3.5–5.1)
Sodium: 132 mEq/L — ABNORMAL LOW (ref 136–145)
Total Protein: 7 g/dL (ref 6.4–8.3)

## 2013-10-29 LAB — CBC WITH DIFFERENTIAL/PLATELET
BASO%: 0.4 % (ref 0.0–2.0)
Eosinophils Absolute: 0.1 10*3/uL (ref 0.0–0.5)
HCT: 29.3 % — ABNORMAL LOW (ref 38.4–49.9)
LYMPH%: 26.9 % (ref 14.0–49.0)
MONO#: 0.6 10*3/uL (ref 0.1–0.9)
NEUT#: 4.6 10*3/uL (ref 1.5–6.5)
NEUT%: 62.2 % (ref 39.0–75.0)
Platelets: 237 10*3/uL (ref 140–400)
RBC: 3.35 10*6/uL — ABNORMAL LOW (ref 4.20–5.82)
WBC: 7.4 10*3/uL (ref 4.0–10.3)
lymph#: 2 10*3/uL (ref 0.9–3.3)
nRBC: 0 % (ref 0–0)

## 2013-10-31 LAB — PROTEIN ELECTROPHORESIS, SERUM
Albumin ELP: 52.5 % — ABNORMAL LOW (ref 55.8–66.1)
Alpha-1-Globulin: 5.5 % — ABNORMAL HIGH (ref 2.9–4.9)
Alpha-2-Globulin: 13.1 % — ABNORMAL HIGH (ref 7.1–11.8)
Beta 2: 5.2 % (ref 3.2–6.5)
Beta Globulin: 6.2 % (ref 4.7–7.2)
Gamma Globulin: 17.5 % (ref 11.1–18.8)
Total Protein, Serum Electrophoresis: 6.6 g/dL (ref 6.0–8.3)

## 2013-10-31 LAB — KAPPA/LAMBDA LIGHT CHAINS
Kappa free light chain: 49 mg/dL — ABNORMAL HIGH (ref 0.33–1.94)
Kappa:Lambda Ratio: 17.31 — ABNORMAL HIGH (ref 0.26–1.65)

## 2013-11-22 ENCOUNTER — Other Ambulatory Visit: Payer: Self-pay | Admitting: Physical Medicine & Rehabilitation

## 2013-11-26 ENCOUNTER — Other Ambulatory Visit (HOSPITAL_BASED_OUTPATIENT_CLINIC_OR_DEPARTMENT_OTHER): Payer: Medicare Other

## 2013-11-26 DIAGNOSIS — C9000 Multiple myeloma not having achieved remission: Secondary | ICD-10-CM

## 2013-11-26 LAB — CBC WITH DIFFERENTIAL/PLATELET
BASO%: 1.3 % (ref 0.0–2.0)
BASOS ABS: 0.1 10*3/uL (ref 0.0–0.1)
EOS%: 1.9 % (ref 0.0–7.0)
Eosinophils Absolute: 0.1 10*3/uL (ref 0.0–0.5)
HCT: 29.6 % — ABNORMAL LOW (ref 38.4–49.9)
HGB: 9.9 g/dL — ABNORMAL LOW (ref 13.0–17.1)
LYMPH%: 24.3 % (ref 14.0–49.0)
MCH: 29.5 pg (ref 27.2–33.4)
MCHC: 33.5 g/dL (ref 32.0–36.0)
MCV: 88.2 fL (ref 79.3–98.0)
MONO#: 0.8 10*3/uL (ref 0.1–0.9)
MONO%: 11.3 % (ref 0.0–14.0)
NEUT%: 61.2 % (ref 39.0–75.0)
NEUTROS ABS: 4.2 10*3/uL (ref 1.5–6.5)
PLATELETS: 193 10*3/uL (ref 140–400)
RBC: 3.35 10*6/uL — ABNORMAL LOW (ref 4.20–5.82)
RDW: 17.5 % — ABNORMAL HIGH (ref 11.0–14.6)
WBC: 6.9 10*3/uL (ref 4.0–10.3)
lymph#: 1.7 10*3/uL (ref 0.9–3.3)

## 2013-11-26 LAB — COMPREHENSIVE METABOLIC PANEL (CC13)
ALBUMIN: 3.7 g/dL (ref 3.5–5.0)
ALT: 12 U/L (ref 0–55)
AST: 19 U/L (ref 5–34)
Alkaline Phosphatase: 58 U/L (ref 40–150)
Anion Gap: 9 mEq/L (ref 3–11)
BUN: 22.8 mg/dL (ref 7.0–26.0)
CALCIUM: 9.6 mg/dL (ref 8.4–10.4)
CHLORIDE: 97 meq/L — AB (ref 98–109)
CO2: 24 mEq/L (ref 22–29)
Creatinine: 1.1 mg/dL (ref 0.7–1.3)
Glucose: 108 mg/dl (ref 70–140)
POTASSIUM: 4.8 meq/L (ref 3.5–5.1)
SODIUM: 130 meq/L — AB (ref 136–145)
TOTAL PROTEIN: 7.2 g/dL (ref 6.4–8.3)
Total Bilirubin: 0.58 mg/dL (ref 0.20–1.20)

## 2013-11-26 LAB — PROTEIN / CREATININE RATIO, URINE
Creatinine, Urine: 34 mg/dL
PROTEIN CREATININE RATIO: 0.15 — AB (ref <0.15)
TOTAL PROTEIN, URINE: 5 mg/dL

## 2013-11-27 ENCOUNTER — Encounter: Payer: Self-pay | Admitting: *Deleted

## 2013-11-27 LAB — KAPPA/LAMBDA LIGHT CHAINS
Kappa free light chain: 55.8 mg/dL — ABNORMAL HIGH (ref 0.33–1.94)
Kappa:Lambda Ratio: 19.31 — ABNORMAL HIGH (ref 0.26–1.65)
Lambda Free Lght Chn: 2.89 mg/dL — ABNORMAL HIGH (ref 0.57–2.63)

## 2013-12-03 ENCOUNTER — Ambulatory Visit (HOSPITAL_COMMUNITY)
Admission: RE | Admit: 2013-12-03 | Discharge: 2013-12-03 | Disposition: A | Discharge status: Home / Self Care | Payer: Medicare Other | Source: Ambulatory Visit | Attending: Oncology | Admitting: Oncology

## 2013-12-03 ENCOUNTER — Ambulatory Visit: Payer: Medicare Other | Admitting: Oncology

## 2013-12-03 ENCOUNTER — Other Ambulatory Visit: Payer: Self-pay | Admitting: Oncology

## 2013-12-03 ENCOUNTER — Telehealth: Payer: Self-pay | Admitting: *Deleted

## 2013-12-03 ENCOUNTER — Ambulatory Visit (HOSPITAL_BASED_OUTPATIENT_CLINIC_OR_DEPARTMENT_OTHER): Payer: Medicare Other | Admitting: Oncology

## 2013-12-03 ENCOUNTER — Telehealth: Payer: Self-pay | Admitting: Oncology

## 2013-12-03 VITALS — BP 117/70 | HR 80 | Temp 97.5°F | Resp 18 | Ht 70.0 in | Wt 187.2 lb

## 2013-12-03 DIAGNOSIS — M25519 Pain in unspecified shoulder: Secondary | ICD-10-CM

## 2013-12-03 DIAGNOSIS — C9000 Multiple myeloma not having achieved remission: Secondary | ICD-10-CM | POA: Insufficient documentation

## 2013-12-03 DIAGNOSIS — D649 Anemia, unspecified: Secondary | ICD-10-CM

## 2013-12-03 DIAGNOSIS — M259 Joint disorder, unspecified: Secondary | ICD-10-CM | POA: Insufficient documentation

## 2013-12-03 DIAGNOSIS — I5023 Acute on chronic systolic (congestive) heart failure: Secondary | ICD-10-CM

## 2013-12-03 DIAGNOSIS — N289 Disorder of kidney and ureter, unspecified: Secondary | ICD-10-CM

## 2013-12-03 DIAGNOSIS — Z951 Presence of aortocoronary bypass graft: Secondary | ICD-10-CM | POA: Insufficient documentation

## 2013-12-03 DIAGNOSIS — I2510 Atherosclerotic heart disease of native coronary artery without angina pectoris: Secondary | ICD-10-CM

## 2013-12-03 DIAGNOSIS — I10 Essential (primary) hypertension: Secondary | ICD-10-CM

## 2013-12-03 MED ORDER — HYDROMORPHONE HCL 4 MG PO TABS: 6.0000 mg | ORAL_TABLET | Freq: Four times a day (QID) | ORAL | Status: DC | PRN

## 2013-12-03 NOTE — Telephone Encounter (Signed)
, °

## 2013-12-03 NOTE — Progress Notes (Signed)
ID: Timothy [name]   DOB: 03/19/1928  MR#: [id number]  ZCH#:[id number]  PCP:  SU: OTHER MD: Dorna Leitz, Alysia Penna, Paulla Fore  HISTORY OF PRESENT ILLNESS: Patient presented in April 2007 with symptomatic slowly progressive anemia, with normal iron parameters, folic acid, and vitamin B12. Plans were made to obtain bone marrow aspirate and biopsy as an outpatient, however the patient was hospitalized shortly thereafter with a creatinine of 8.6. Renal biopsy demonstrated the presence of  light chain deposition disease. Bone marrow biopsy demonstrated 37% plasma cells. A skeletal survey showed small calvarial lytic lesions, mild osteopenia, and cervical spondylosis. With a well-established diagnosis of light-chain multiple myeloma Dr. Melodie Bouillon subsequent treatments are as detailed below.   INTERVAL HISTORY: Timothy [name] returns today with his wife, [name], for followup of his multiple myeloma. He is doing a lot better as far as his right leg fracture leg is concerned, but he continues to have problems with his left foot. Of course this is where he had a big toe amputated. The pressure sore on his heel is now almost resolved, although that area remains painful. He has however other area in the front of the foot that has not completely healed. He is planning to [facility] for further evaluation and treatment.  REVIEW OF SYSTEMS: Timothy [name] is mostly using the wheelchair at home although he also uses a walker. He is using the nylon and perhaps once a day but not every day. Mostly uses it at night to help him sleep. He complains of pain particularly in his left shoulder when he lies on the left side. This is not exactly new, but it is worse. Rehabilitation is now down to once a week, at home, but he also has a nursing visit once a week at home. He gets moderately constipated from the narcotics. A detailed review of systems today was otherwise stable    PAST MEDICAL  HISTORY: Past Medical History  Diagnosis Date  . ASCVD (arteriosclerotic cardiovascular disease)   . Anemia     chronic mild anemia  . Gout   . Hypertension   . Lumbar disc disease     post laminectomy  . Chronic back pain   . History of echocardiogram 11/02/2010    EF range of 30 to 35% / There is hypokinsesis of the basal-mild inferolateral myocardium  . CHF (congestive heart failure)   . Heart murmur   . Ischemic cardiomyopathy   . Renal insufficiency   . Peripheral neuropathy   . Inferior myocardial infarction 1986  . Hyperlipidemia   . SOB (shortness of breath)   . Fatigue   . Peripheral neuropathy   . Coronary artery disease     CABG 2002 by Dr. Roxy Manns with LIMA to LAD, SVG to intermediate, SVG to LCX, & SVG to PL. Last nuclear in 2012 showing large inferior scar with EF of 39%.   . Blood transfusion     DEC 2012 - TWO UNITS  . Myeloma     Multiple myeloma, with recurrence of increasing problems  DR. [place] -ONCOLOGIST.  PT HAS BEEN OFF CHEMO SINCE HIS HOSPITALIZATION FEB 2013 FOR LEFT FOOT INFECTION  . Cancer   . History of shingles MARCH 2009    SHINGLE LESIONS WERE AROUND RIGHT EYE--PT HAS RESIDUAL ITCHING AROUND THE EYE.  . Osteomyelitis     s/p toe amputation  . Chronic systolic heart failure     EF of 30% per echo 02/2012  .  Protein malnutrition    PAST SURGICAL HISTORY: Past Surgical History  Procedure Laterality Date  . Laminectomy    . Inguinal hernia repair    . Cardiac catheterization  06/20/2000    Severe coronary disease (totally occluded right artery, 90% left circumflex, 50-60% intermediate, and 90-95% ostial left anterior descending)   . Tonsillectomy    . Coronary artery bypass graft  2002    x4 / Left internal mammary artery to the LAD.  / Saphenous vein graft to the right posterolateral.  /  Saphenous vein graft to  the ramus intermedius. / Saphenous vein graft to the circumflex marginal     . Amputation  03/06/2012    Procedure: AMPUTATION RAY;   Surgeon: Alta Corning, MD;  Location: [facility];  Service: Orthopedics;  Laterality: Left;  First Ray Amputation  . 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   ADVANCED DIRECTIVES:  HEALTH MAINTENANCE: History  Substance Use Topics  . Smoking status: Former Smoker -- 1.00 packs/day for 20 years    Types: Cigarettes    Quit date: 11/20/1984  . Smokeless tobacco: Never Used  . Alcohol Use: No     Colonoscopy:  Bone density:  Lipid panel:  Allergies  Allergen Reactions  . Morphine And Related Other (See Comments)    Agitated with morphine drip    Current Outpatient Prescriptions  Medication Sig Dispense Refill  . allopurinol (ZYLOPRIM) 300 MG tablet Take 0.5 tablets (150 mg total) by mouth daily.  90 tablet  0  . aspirin (BAYER ASPIRIN) 325 MG tablet Take 1 tablet (325 mg total) by mouth daily.  60 tablet  0  . atorvastatin (LIPITOR) 10 MG tablet Take 1 tablet (10 mg total) by mouth daily.  90 tablet  3  . B Complex-Biotin-FA (B COMPLETE PO) Take 0.5 tablets by mouth 2 (two) times daily.       . calcium carbonate (TUMS - DOSED IN MG ELEMENTAL CALCIUM) 500 MG chewable tablet Chew 1 tablet (200 mg of elemental calcium total) by mouth 2 (two) times daily with a meal.  60 tablet  1  . carvedilol (COREG) 6.25 MG tablet Take 6.25 mg by mouth 2 (two) times daily with a meal.      . Coenzyme Q10 (CO Q-10 PO) Take 1 tablet by mouth every morning.       . collagenase (SANTYL) ointment Apply topically daily. USE TO AREAS DAILY AS DIRECTED BY HOME HEALTH NURSE  15 g  6  . DULoxetine (CYMBALTA) 30 MG capsule Take 30 mg by mouth  every evening.      . DULoxetine (CYMBALTA) 30 MG capsule TAKE 2 CAPSULES(60 MG TOTAL) DAILY  180 capsule  0  . HYDROmorphone (DILAUDID) 4 MG tablet Take 1.5 tablets (6 mg total) by mouth every 6 (six) hours as needed for pain.  120 tablet  0  . lisinopril (PRINIVIL,ZESTRIL) 20 MG tablet TAKE 1 TABLET DAILY  90 tablet  0  . methocarbamol (ROBAXIN) 500 MG tablet Use one pill four times a day for muscle spasms.   In a week decrease to three times a day and then further as tolerated.  120 tablet  1  . Multiple Vitamin (MULTIVITAMIN) capsule Take 1 capsule by mouth every morning.       . nitroGLYCERIN (NITROSTAT) 0.4 MG SL tablet Place 0.4 mg under the tongue every 5 (five) minutes as needed for chest pain.      . polyethylene glycol (MIRALAX / GLYCOLAX) packet Take 17 g by mouth daily as needed.  14 each  0  . tamsulosin (FLOMAX) 0.4 MG CAPS capsule Take 0.4 mg by mouth at bedtime.      . vitamin C (ASCORBIC ACID) 500 MG tablet Take 500 mg by mouth every morning.       No current facility-administered medications for this visit.    OBJECTIVE: Elderly white male who appears stated age 89 Vitals:   12/03/13 1201  BP: 117/70  Pulse: 80  Temp: 97.5 F (36.4 C)  Resp: 18     Body mass index is 26.86 kg/(m^2).    ECOG FS: 2 Filed Weights   12/03/13 1201  Weight: 187 lb 3.2 oz (84.913 kg)   Sclerae unicteric Oropharynx clear, slightly dry No peripheral adenopathy Lungs clear bilaterally  Heart regular rate and rhythm Abdomen soft, nontender, positive bowel sounds MSK I did not uncover his left foot, which is bandaged. There is no ankle swelling bilaterally. Neuro: nonfocal, well oriented, positive affect   LAB RESULTS:  Results for KACIN, DANCY (MRN [id number]) as of 12/03/2013 12:11  Ref. Range 05/12/2013 10:56 06/17/2013 10:24 07/16/2013 10:57 08/13/2013 11:32 11/26/2013 11:22  PROTEIN CREATININE RATIO Latest Range: <0.15  0.07 0.12 0.07 0.09 0.15 (H)  Results for GIOVONI, BUNCH  (MRN [id number]) as of 12/03/2013 12:11  Ref. Range 07/16/2013 10:57 08/13/2013 11:32 10/01/2013 10:51 10/29/2013 10:55 11/26/2013 11:21  Kappa free light chain Latest Range: 0.33-1.94 mg/dL 28.00 (H) 27.10 (H) 46.90 (H) 49.00 (H) 55.80 (H)    Lab Results  Component Value Date   WBC 6.9 11/26/2013   NEUTROABS 4.2 11/26/2013   HGB 9.9* 11/26/2013   HCT 29.6* 11/26/2013   MCV 88.2 11/26/2013   PLT 193 11/26/2013      Chemistry      Component Value Date/Time   NA 130* 11/26/2013 1121   NA 132* 09/02/2013 0500   K 4.8 11/26/2013 1121   K 4.0 09/02/2013 0500   CL 99 09/02/2013 0500   CL 98 05/12/2013 1048   CO2 24 11/26/2013 1121   CO2 24 09/02/2013 0500   BUN 22.8 11/26/2013 1121   BUN 25* 09/02/2013 0500   CREATININE 1.1 11/26/2013 1121   CREATININE 1.01 09/02/2013 0500      Component Value Date/Time   CALCIUM 9.6 11/26/2013 1121   CALCIUM 8.2* 09/02/2013 0500   ALKPHOS 58 11/26/2013 1121   ALKPHOS 40 08/22/2013 0620   AST 19 11/26/2013 1121   AST 28 08/22/2013 0620   ALT 12 11/26/2013 1121   ALT 22 08/22/2013 0620   BILITOT 0.58 11/26/2013 1121   BILITOT 1.2 08/22/2013 0620      STUDIES: No results found.  ASSESSMENT: 80 y.o.  with kappa light chain multiple myeloma  (1) presenting April 2007 with anemia and renal failure; renal Bx showing free kappa light chain deposition; bone marrow biopsy showing 37% plasma cells, with normal cytogenetics and FISH;bone survey showing skull lytic lesions; treated with  (2) thalidomide (200 mg/d) and dexamethasone (40 mg/d x4d Q28d) June through Nov 2007, with good response, but poor tolerance;  (3) thalidomide decreased to 100 mg/d, dexamethasone continued, bortezomib (IV) added, Dec 2007 to  March 2008  (4) treatment interrupted by multiple complications (peripheral neuropathy, pulmonary embolism, diverticular abscess, CN V zoster, severe DDD, congestive heart failure, rising PSA); maintenance zolendronic acid through March 2012  (5) progression April 2012, treated  initially with dexamethasone alone, poorly tolerated;  (6) bortezomib (sq) resumed July 2012; cyclophosphamide and dexamethasone added Sept 2012; zolendronic acid changed to Q 14months; all treatments held as of March 2013 due to the development of the Left foot ulcer described above (7) anemia, on aranesp December 2012 to August 2013 without significant response  (8) status post Right trochanteric nail with proximal and distal interlock  DePuy 11 mm one-pin lag screw, 44 mm distal interlock 08/09/2013 for a right comminuted intertrochanteric fracture    PLAN:   There has been a mild to moderate but steady rise over the last 3 months in the kappa lambda ratio as well as in the protein creatinine urine ratio. I think this really bears watching. We are going to obtain labs every month and he will see me again in 2 months, the latter half of March, at which time we can consider perhaps going on pomalidomide. Today I am obtaining left shoulder films in case there is a lytic lesion there that could be radiated explaining his pain. Before that March visit we will do a myeloma survey.  I have encouraged him to drink 2-3 quarts of liquid daily. I also encouraged him to see the foot doctor as he finally agreed to today. Timothy [name] knows to call for any problems that may develop before the next visit here.   MAGRINAT,GUSTAV C    12/03/2013

## 2013-12-03 NOTE — Telephone Encounter (Signed)
Per Dr [name], called patient with right shoulder xray results and informed him that there is severe degenerative changes in the right shoulder and RT will not help this. We will be glad to send to Ortho for Surgical consult, but patient states the pain is not bad enough to warrant a surgery. He states he just has a hard time getting positioned and comfortable at bedtime.  Suggested to patient to try some Aleve or Ibuprofen along with his Robaxin at bedtime to help with his sleeping. He says he will try this tonight. Patient encouraged to call us with any problems.

## 2013-12-03 NOTE — Telephone Encounter (Signed)
Order for left shoulder but patient says the right shoulder needs to be x-rayed.  Dr. [name] pager given to radiology for clarification as all notes read left shoulder.

## 2013-12-12 ENCOUNTER — Encounter: Payer: Self-pay | Admitting: Podiatrist

## 2013-12-12 ENCOUNTER — Ambulatory Visit (INDEPENDENT_AMBULATORY_CARE_PROVIDER_SITE_OTHER): Payer: Medicare Other | Admitting: Podiatrist

## 2013-12-12 VITALS — BP 120/62 | HR 62 | Resp 18

## 2013-12-12 DIAGNOSIS — M86179 Other acute osteomyelitis, unspecified ankle and foot: Secondary | ICD-10-CM

## 2013-12-12 MED ORDER — CEPHALEXIN 500 MG PO CAPS: 500.0000 mg | ORAL_CAPSULE | Freq: Three times a day (TID) | ORAL | Status: DC

## 2013-12-12 NOTE — Addendum Note (Signed)
Addended by: Laureen Abrahams on: 12/12/2013 06:59 PM   Modules accepted: Orders

## 2013-12-12 NOTE — Patient Instructions (Signed)
Take your antibiotic as prescribed until the surgery date-  We will remove a portion of the to that has exposed bone.  Please call if you notice any increased redness, swelling, or drainage please let me know.

## 2013-12-12 NOTE — Progress Notes (Signed)
   Subjective:    Patient ID: Timothy [name], male    DOB: 1928-10-07, 80 y.o.   MRN: [id number]  HPI patient presents today with a chief concern of the left second toe ulcer. He also has a left heel ulcer which has been occurring since October 2014. The patient's wife states "it started out as a bed sore due to him being in rehab and the 2nd toe on left foot has been going on for a couple of months and the left big toe was amputated due to a callus and was infected so they had to take it off". the patient is receiving home health care from a wound care nurse for the left heel. She's been applying Santyl to the heel wound and the patient and his wife state it is improving but still tender. Left second toe has a sore on it and it's been present for 2 months .  Review of Systems  Constitutional: Negative.   HENT: Negative.   Eyes: Positive for visual disturbance.  Respiratory: Negative.   Cardiovascular: Negative.   Gastrointestinal: Negative.   Endocrine: Negative.   Genitourinary: Negative.   Musculoskeletal: Positive for back pain.       Difficulty walking   Skin:       Open sores  Allergic/Immunologic: Negative.   Neurological: Negative.   Hematological: Bruises/bleeds easily.       Slow to heal  Psychiatric/Behavioral: Negative.        Objective:   Physical Exam Vascular status is decreased with faintly palpable PT pulse and DP pulses are one out of four left. Neurological sensation is decreased both epicriticaly and protectively with peripheral neuropathy present. Sensation is incising monofilament is decreased at 2/5 sites left. Light touch is also decreased left. Patient has pain on the posterior aspect of the left heel where there is a well-circumscribed heel ulceration from a previous pressure related injury measures approximately 2 cm in diameter and patient's been applying Santyl to the wound and has a wound care nurse tending to this wound. Area of concern at today's visit  is the left second toe. The right hallux has previously been amputated and there is increased pressure on the left second toe from shoe gear. The head of the middle phalanx is exposed and bone is easily palpated. The toe itself is also red and swollen.       Assessment & Plan:  Osteomyelitis with ulceration left second toe Plan: Recommended surgical excision of the infected bone. Will leave the proximal phalanx if we're able. Consent forms were discussed and the patient will have the procedure performed at [facility] on outpatient basis. This will also be done locally as well. He's also placed  Keflex antibiotics for the swelling and redness around the toe. If the toe gets worse he is instructed to call me immediately.

## 2013-12-17 DIAGNOSIS — M86179 Other acute osteomyelitis, unspecified ankle and foot: Secondary | ICD-10-CM

## 2013-12-17 DIAGNOSIS — L03039 Cellulitis of unspecified toe: Secondary | ICD-10-CM

## 2013-12-17 DIAGNOSIS — L02619 Cutaneous abscess of unspecified foot: Secondary | ICD-10-CM

## 2013-12-18 ENCOUNTER — Telehealth: Payer: Self-pay | Admitting: *Deleted

## 2013-12-18 NOTE — Telephone Encounter (Signed)
Pt 's wife asked how long to keep surgery foot elevated.  I told pt, to elevate 6 inches above the hip for at least 72 hours, but could have level on the bed if uncomfortable, just don't dangle or be up on it more than 5 minutes per hour.  Pt states the elevating position constantly is the only problem.

## 2013-12-26 ENCOUNTER — Encounter: Payer: Self-pay | Admitting: Podiatrist

## 2013-12-26 ENCOUNTER — Ambulatory Visit (INDEPENDENT_AMBULATORY_CARE_PROVIDER_SITE_OTHER): Payer: Medicare Other | Admitting: Podiatrist

## 2013-12-26 ENCOUNTER — Ambulatory Visit (INDEPENDENT_AMBULATORY_CARE_PROVIDER_SITE_OTHER): Payer: Medicare Other

## 2013-12-26 VITALS — BP 121/66 | HR 72 | Resp 18

## 2013-12-26 DIAGNOSIS — Z09 Encounter for follow-up examination after completed treatment for conditions other than malignant neoplasm: Secondary | ICD-10-CM

## 2013-12-26 DIAGNOSIS — M86179 Other acute osteomyelitis, unspecified ankle and foot: Secondary | ICD-10-CM

## 2013-12-26 NOTE — Progress Notes (Signed)
   Subjective: Patient presents today1 week status post foot surgery of the left foot- Amputation of the middle and distal phalanx for exposed bone and osteomyelitis.  Date of surgery 12/17/2013. Patient denies nausea, vomiting, fevers, chills or night sweats.  Denies calf pain or tenderness to the operative side. Patient's wife is concerned over a red 4th toe and is concerned over the way the skin looks thin.  That patient also states he took a shower and got the bandaged wet he states his wife rewrapped the toe.  Objective:  Neurovascular status is intact with palpable pedal pulses DP and PT bilateral. Neurological sensation is intact and unchanged as per prior to surgery. Swollen appearance of the postoperative foot is noted. No obvious redness of the fourth toe but it does look to the rubbing on the third toe with no sign of ulceration present. The patient's skin is friable and thin as per his preoperative findings. There is some light scaling and peeling of the skin which is most likely due to the swelling is slowly resolving status post surgery. The incision site itself appears to be healing well no redness, no swelling, no drainage.  Assessment: Status post amputation distal and middle phalanx left second toe  Plan:  Every other suture is removed and A new dry sterile dressing was applied. Patient will be seen back in one week for wear the remainder of the sutures will be removed. Patient given instructions on use of lambswool in between the toes. He will finish up his antibiotic as well.

## 2013-12-26 NOTE — Patient Instructions (Signed)
You may get the foot wet-- be sure to dry well between the toes and apply antibiotic ointment and a dressing to the toe.  Take the amoxicillin for 10 more days.  Elevate the foot to reduce the swelling and redness, try lambswool between the toes as well ([facility] or [facility])

## 2013-12-31 ENCOUNTER — Other Ambulatory Visit (HOSPITAL_BASED_OUTPATIENT_CLINIC_OR_DEPARTMENT_OTHER): Payer: Medicare Other

## 2013-12-31 DIAGNOSIS — C9000 Multiple myeloma not having achieved remission: Secondary | ICD-10-CM

## 2013-12-31 LAB — CBC WITH DIFFERENTIAL/PLATELET
BASO%: 0.4 % (ref 0.0–2.0)
Basophils Absolute: 0 10*3/uL (ref 0.0–0.1)
EOS%: 1.5 % (ref 0.0–7.0)
Eosinophils Absolute: 0.1 10*3/uL (ref 0.0–0.5)
HCT: 30.7 % — ABNORMAL LOW (ref 38.4–49.9)
HGB: 10.2 g/dL — ABNORMAL LOW (ref 13.0–17.1)
LYMPH%: 26.2 % (ref 14.0–49.0)
MCH: 28.6 pg (ref 27.2–33.4)
MCHC: 33.2 g/dL (ref 32.0–36.0)
MCV: 86 fL (ref 79.3–98.0)
MONO#: 0.8 10*3/uL (ref 0.1–0.9)
MONO%: 10.2 % (ref 0.0–14.0)
NEUT#: 4.8 10*3/uL (ref 1.5–6.5)
NEUT%: 61.7 % (ref 39.0–75.0)
NRBC: 0 % (ref 0–0)
PLATELETS: 238 10*3/uL (ref 140–400)
RBC: 3.57 10*6/uL — AB (ref 4.20–5.82)
RDW: 15.7 % — ABNORMAL HIGH (ref 11.0–14.6)
WBC: 7.8 10*3/uL (ref 4.0–10.3)
lymph#: 2.1 10*3/uL (ref 0.9–3.3)

## 2013-12-31 LAB — COMPREHENSIVE METABOLIC PANEL (CC13)
ALT: 13 U/L (ref 0–55)
ANION GAP: 9 meq/L (ref 3–11)
AST: 22 U/L (ref 5–34)
Albumin: 3.8 g/dL (ref 3.5–5.0)
Alkaline Phosphatase: 56 U/L (ref 40–150)
BILIRUBIN TOTAL: 0.49 mg/dL (ref 0.20–1.20)
BUN: 28.2 mg/dL — AB (ref 7.0–26.0)
CO2: 24 meq/L (ref 22–29)
CREATININE: 1.3 mg/dL (ref 0.7–1.3)
Calcium: 10 mg/dL (ref 8.4–10.4)
Chloride: 98 mEq/L (ref 98–109)
GLUCOSE: 108 mg/dL (ref 70–140)
Potassium: 4.7 mEq/L (ref 3.5–5.1)
Sodium: 130 mEq/L — ABNORMAL LOW (ref 136–145)
Total Protein: 7.4 g/dL (ref 6.4–8.3)

## 2014-01-01 LAB — KAPPA/LAMBDA LIGHT CHAINS
KAPPA LAMBDA RATIO: 16.8 — AB (ref 0.26–1.65)
Kappa free light chain: 43 mg/dL — ABNORMAL HIGH (ref 0.33–1.94)
Lambda Free Lght Chn: 2.56 mg/dL (ref 0.57–2.63)

## 2014-01-02 ENCOUNTER — Encounter: Payer: Self-pay | Admitting: Podiatrist

## 2014-01-02 ENCOUNTER — Ambulatory Visit (INDEPENDENT_AMBULATORY_CARE_PROVIDER_SITE_OTHER): Payer: Medicare Other | Admitting: Podiatrist

## 2014-01-02 VITALS — BP 135/60 | HR 76 | Resp 12

## 2014-01-02 DIAGNOSIS — Z09 Encounter for follow-up examination after completed treatment for conditions other than malignant neoplasm: Secondary | ICD-10-CM

## 2014-01-02 DIAGNOSIS — M86179 Other acute osteomyelitis, unspecified ankle and foot: Secondary | ICD-10-CM

## 2014-01-02 NOTE — Progress Notes (Signed)
DOS 12/17/13 '' LT FOOT 2ND TOE IS DOING OK.''  Subjective: Patient and his wife present today 2 weeks status post foot surgery of the left foot- Amputation of the middle and distal phalanx for exposed bone and osteomyelitis. Date of surgery 12/17/2013. Patient denies nausea, vomiting, fevers, chills or night sweats. Denies calf pain or tenderness to the operative side. The left 4th toe is looking better- lambswool is carefully applied.  Continued presence of a heel ulcer is seen.    Objective: Neurovascular status is intact with palpable pedal pulses DP and PT bilateral. Neurological sensation is intact and unchanged as per prior to surgery. Improvement of Swollen appearance of the postoperative foot is noted. No obvious redness of the fourth toe with no sign of ulceration present. The patient's skin is friable and thin as per his preoperative findings. There is some light scaling and peeling of the skin which is most likely due to the swelling is slowly resolving status post surgery. The incision site itself appears to be healing well no redness, no swelling, no drainage.  Heel ulcer continues to be present and stable.    Assessment: Status post amputation distal and middle phalanx left second toe   Plan: remainder of suture is removed and A new dry sterile dressing was applied. He may get the foot wet.  He is to continue to keep a bandage on the toe.  We discussed trying a silver collagen on the heel wound.  Will try to order this through [facility].  I will recheck the foot and toe in 2 weeks.  If there is any problems or concern he is to call.

## 2014-01-13 ENCOUNTER — Telehealth: Payer: Self-pay | Admitting: *Deleted

## 2014-01-13 NOTE — Telephone Encounter (Signed)
Dr Valentina Lucks ordered silver collagen dressing for left heel ulcer 1cm x1cm x62mm coded 707.14.  Faxed [facility] form, pt demographic and insurance forms.

## 2014-01-16 ENCOUNTER — Encounter: Payer: Self-pay | Admitting: Podiatrist

## 2014-01-16 ENCOUNTER — Ambulatory Visit (INDEPENDENT_AMBULATORY_CARE_PROVIDER_SITE_OTHER): Payer: Medicare Other | Admitting: Podiatrist

## 2014-01-16 VITALS — BP 127/64 | HR 86 | Resp 12

## 2014-01-16 DIAGNOSIS — M86179 Other acute osteomyelitis, unspecified ankle and foot: Secondary | ICD-10-CM

## 2014-01-16 DIAGNOSIS — Z09 Encounter for follow-up examination after completed treatment for conditions other than malignant neoplasm: Secondary | ICD-10-CM

## 2014-01-16 NOTE — Progress Notes (Signed)
   DOS 12/17/13 '' THE TOE HAS A SCAB AND IS A LITTLE LOOSE.''   Subjective: Patient and his wife present today 3 weeks status post foot surgery of the left foot- Amputation of the middle and distal phalanx for exposed bone and osteomyelitis. Date of surgery 12/17/2013. Patient denies nausea, vomiting, fevers, chills or night sweats. Denies calf pain or tenderness to the operative side. The left 4th toe is looking better- lambswool is carefully applied. Continued presence of a heel ulcer is seen. Scab present at the tip of the 2nd toe at the amputation site.  Appears to have decreased healing rate.   Objective: vascular status today is decreased with non palpable pedal pulses DP and PT bilateral. Neurological sensation is intact and unchanged as per prior to surgery. Improvement of Swollen appearance of the postoperative foot is noted. No obvious redness of the fourth toe with no sign of ulceration present. The patient's skin is friable and thin as per his preoperative findings. The incision site itself appears to have slowed in healing.  Dark eschar is present and does not appear to be integrating as well as the last visit.   Heel ulcer continues to be present and stable. Patient is wearing tennis shoes as opposed to his darco shoe as recommended.  Assessment: Status post amputation distal and middle phalanx left second toe, slow healing,   Plan: Iodosorb and A new dry sterile dressing was applied.  He is to continue to keep a bandage on the toe with iodosorb.  . We discussed trying a silver collagen on the heel wound. He will have to d/c at home pt and then again will try to order this through [facility]. I will recheck the foot and toe in 1 weeks. Vascular testing may be warranted pending the appearance of the toe.  Gave instructions to keep shoe and pressure off the toe to allow it to heal properly

## 2014-01-16 NOTE — Patient Instructions (Signed)
I will find out if the medication for the leg tremors will cause any problems with your current medications and will call you to let you know.  Keep the pressure off of your toe as much as possible.  The tip of the toe is very fragile and needs as little pressure present to let it heal.

## 2014-01-19 ENCOUNTER — Telehealth: Payer: Self-pay | Admitting: *Deleted

## 2014-01-19 DIAGNOSIS — R0989 Other specified symptoms and signs involving the circulatory and respiratory systems: Secondary | ICD-10-CM

## 2014-01-19 NOTE — Telephone Encounter (Addendum)
[name] called states the rx for pt's legs was not called into the pharmacy.

## 2014-01-20 ENCOUNTER — Other Ambulatory Visit: Payer: Self-pay | Admitting: Podiatrist

## 2014-01-20 ENCOUNTER — Other Ambulatory Visit (HOSPITAL_COMMUNITY): Payer: Self-pay | Admitting: Cardiology

## 2014-01-20 ENCOUNTER — Telehealth: Payer: Self-pay | Admitting: *Deleted

## 2014-01-20 DIAGNOSIS — R0989 Other specified symptoms and signs involving the circulatory and respiratory systems: Secondary | ICD-10-CM

## 2014-01-20 MED ORDER — ROPINIROLE HCL 0.5 MG PO TABS: 0.5000 mg | ORAL_TABLET | Freq: Every day | ORAL | Status: DC

## 2014-01-20 NOTE — Addendum Note (Signed)
Addended by: Harriett Sine D on: 01/20/2014 04:22 PM   Modules accepted: Orders, Medications

## 2014-01-20 NOTE — Telephone Encounter (Addendum)
Message copied by Andres Ege on Tue Jan 20, 2014  1:48 PM ------      Message from: Bronson Ing      Created: Sun Jan 18, 2014  9:23 PM      Regarding: vascular testing       Hey Val,            I wanted to see if you could call Dr. Clemetine Marker (retired Animal nutritionist) and ask if he has had a recent vascular study to assess the circulation in his left leg?  I was looking through his recent encounters and I don't see one in there.  If he has not had one in the last 6 months (or ever), could you set him up to get the lab test done at [facility]? (I think that's where his cardiologist is so it would be easiest with paperwork, etc for him)            I would like the test to help assess how long it will take for the foot and toe to heal.              Thanks!   ------Dr Valentina Lucks states order 0.5 mg Requip for pt with standard instruction per Johnnye Lana, and order L. Arterial Dopplers with toe pressures.  Requip ordered and left lower arterial doppler with toe pressures ordered with [place], all called to pt's wife [name].

## 2014-01-20 NOTE — Telephone Encounter (Signed)
Entered in error

## 2014-01-20 NOTE — Telephone Encounter (Signed)
Yes, she is correct that the restless leg medication was not called in.   I did not realize that Requip is on the list of medications that cannot be e-prescribed.  I entered the order into his chart but am unable to print it .  If you can print it, she can come by the office and pick it up.  Otherwise they can wait till their follow up appointment with me this week.  Thanks!

## 2014-01-20 NOTE — Telephone Encounter (Signed)
requip is a titratable drug-  I pended the order in the medication screen section if you can print that one out.    Otherwise it is 0.25 mg po qhs for 2 days, than may increase to 0.5mg  po q pm x 5 days.  Then if it needs to be increased, increase by 0.5mg  per day weekly until 3 mg.  Take 1-3 hours before bedtime.

## 2014-01-20 NOTE — Telephone Encounter (Signed)
Dr Valentina Lucks states Requip is a titrated medication and sent the medication schedule in message below.

## 2014-01-21 ENCOUNTER — Encounter: Payer: Self-pay | Admitting: Podiatrist

## 2014-01-22 ENCOUNTER — Other Ambulatory Visit: Payer: Self-pay | Admitting: Podiatrist

## 2014-01-22 ENCOUNTER — Encounter: Payer: Self-pay | Admitting: Podiatrist

## 2014-01-22 MED ORDER — ROPINIROLE HCL 0.5 MG PO TABS: ORAL_TABLET | ORAL | Status: DC

## 2014-01-26 ENCOUNTER — Ambulatory Visit (HOSPITAL_COMMUNITY): Payer: Medicare Other | Attending: Cardiology | Admitting: *Deleted

## 2014-01-26 DIAGNOSIS — L98499 Non-pressure chronic ulcer of skin of other sites with unspecified severity: Secondary | ICD-10-CM

## 2014-01-26 DIAGNOSIS — R0989 Other specified symptoms and signs involving the circulatory and respiratory systems: Secondary | ICD-10-CM | POA: Insufficient documentation

## 2014-01-26 DIAGNOSIS — I739 Peripheral vascular disease, unspecified: Secondary | ICD-10-CM

## 2014-01-26 NOTE — Progress Notes (Signed)
Lower extremity arterial doppler and duplex complete.

## 2014-01-28 ENCOUNTER — Telehealth: Payer: Self-pay | Admitting: *Deleted

## 2014-01-28 ENCOUNTER — Encounter: Payer: Self-pay | Admitting: *Deleted

## 2014-01-28 NOTE — Telephone Encounter (Addendum)
DR Egerton's recommendations called to pt.  Pt states he was not given an appt at the time of his listing, and would accept Dr Shaune Pollack referral recommendation.  Called [phone] to refer pt to DR Quay Burow,  appt is set for 02/13/2014 at 0915am.  I informed pt's wife, [name] of the appt with Dr Martinique.  [name] states pt has and appt with Dr Martinique at [facility] on 02/17/2014.  I told her I would cancel the appt with DR Gwenlyn Found and send a letter to Dr Martinique informing of the dopplers of 01/26/2014.

## 2014-01-28 NOTE — Telephone Encounter (Signed)
Message copied by Andres Ege on Wed Jan 28, 2014  9:24 AM ------      Message from: Bronson Ing      Created: Tue Jan 27, 2014  5:35 PM      Regarding: Dr. Melodie Bouillon circulation study       Val,            Can you call Dr. Clemetine Marker or his wife and let them know that the study suggested overall circulation is OK but there are some areas of concern.  The report recommended a consult with [facility]-- they typically set up the appointment for the patients if they recommend that but I'm not sure if it was done for him or not?  If he needs one please refer him to Dr. Gwenlyn Found at [facility] or Dr. Annamarie Major at [facility].            Thanks!! ------

## 2014-01-30 ENCOUNTER — Encounter: Payer: Self-pay | Admitting: Podiatrist

## 2014-01-30 ENCOUNTER — Ambulatory Visit (INDEPENDENT_AMBULATORY_CARE_PROVIDER_SITE_OTHER): Payer: Medicare Other | Admitting: Podiatrist

## 2014-01-30 VITALS — BP 132/66 | HR 70 | Temp 98.1°F | Resp 18

## 2014-01-30 DIAGNOSIS — Z09 Encounter for follow-up examination after completed treatment for conditions other than malignant neoplasm: Secondary | ICD-10-CM

## 2014-01-30 DIAGNOSIS — I739 Peripheral vascular disease, unspecified: Secondary | ICD-10-CM

## 2014-01-30 DIAGNOSIS — R0989 Other specified symptoms and signs involving the circulatory and respiratory systems: Secondary | ICD-10-CM

## 2014-01-30 NOTE — Progress Notes (Signed)
  Subjective: Patient and his wife [name] present today 6 weeks status post foot surgery of the left foot- Amputation of the middle and distal phalanx of the second toe for osteomyelitis. Date of surgery 12/17/2013. Patient denies nausea, vomiting, fevers, chills or night sweats. Denies calf pain or tenderness to the operative side. The left 4th toe is looking better- lambswool is carefully applied. Continued presence of a heel ulcer is seen. Scab continues to be present at the tip of the 2nd toe at the amputation site. He was see for his vascular testing which recommended follow up to address circulation.  We made an appointment for him to see Dr. Gwenlyn Found however, he asked to see his own cardiologist, Dr. Martinique instead.  His appointment is at the end of the month.  Objective: vascular status today is decreased with non palpable pedal pulses DP and PT bilateral. Neurological sensation is unchanged as per prior to surgery. Improvement of Swollen appearance of the postoperative foot is noted. Eschar is present at the stump of the left 2nd toe.  Granulation tissue is present however, the incision site is healing at a very slow rate post operatively.   No obvious redness of the fourth toe with no sign of ulceration present. The patient's skin is friable and thin as per his preoperative findings. Heel ulcer continues to be present and stable. Patient is wearing tennis shoes as opposed to his darco shoe as recommended.   Assessment: Status post amputation distal and middle phalanx left second toe, slow healing.  Decreased circulation, heel ulcer (mri previously showed osteomyelitis)  Plan: Iodosorb and A new dry sterile dressing was applied to the toe and to the heel. He is to continue to keep a bandage on the toe with iodosorb. . We discussed trying a silver collagen on the heel wound. He will have to d/c at home pt and then again will try to order this through [facility]. I will recheck the foot and toe in 2 weeks.    Gave instructions to keep shoe and pressure off the toe to allow it to heal properly

## 2014-01-30 NOTE — Patient Instructions (Signed)
Please call when you have finished your physical therapy treatments and I will order the new wound product for your heel.  I will see you back in 2 weeks

## 2014-02-02 ENCOUNTER — Telehealth: Payer: Self-pay | Admitting: *Deleted

## 2014-02-02 NOTE — Telephone Encounter (Signed)
I called and informed the patient that his appointment has been rescheduled to 02/10/14 with Dr. Martinique per Marcy Siren.

## 2014-02-02 NOTE — Telephone Encounter (Signed)
Rescheduled pt's Dr. Martinique appt to 02/10/2014 at 130pm.  Called [phone] line is busy, and [phone] left a message to call for new appt time with Dr. Martinique.

## 2014-02-02 NOTE — Telephone Encounter (Signed)
Message copied by Andres Ege on Mon Feb 02, 2014  9:56 AM ------      Message from: Bronson Ing      Created: Fri Jan 30, 2014  5:21 PM      Regarding: circulation test       Can you check to see if his appointment can be moved up for his lower extremity circulation study appt with Dr. Martinique-  He will most likely need to be seen by Dr. Gwenlyn Found eventually so the sooner the better.             Thanks! ------

## 2014-02-03 ENCOUNTER — Telehealth: Payer: Self-pay | Admitting: *Deleted

## 2014-02-03 NOTE — Telephone Encounter (Signed)
Patient's wife called and requested we contact patient on his cell number [phone].  I called the patient.  He stated that someone from Dr. Doug Sou office called and canceled his appointment for 02/10/14 stated that Dr. Martinique would not be in the office on that day.  He wanted to know if we could call and get him another appointment before the 24th.  I told him I would see what I could do.  I got patient scheduled for 02/06/14 at 8:50am with Dr. Doug Sou PA, Richardson Dopp.  I informed the patient.

## 2014-02-04 ENCOUNTER — Ambulatory Visit (HOSPITAL_COMMUNITY)
Admission: RE | Admit: 2014-02-04 | Discharge: 2014-02-04 | Disposition: A | Discharge status: Home / Self Care | Payer: Medicare Other | Source: Ambulatory Visit | Attending: Oncology | Admitting: Oncology

## 2014-02-04 ENCOUNTER — Other Ambulatory Visit (HOSPITAL_BASED_OUTPATIENT_CLINIC_OR_DEPARTMENT_OTHER): Payer: Medicare Other

## 2014-02-04 DIAGNOSIS — C9000 Multiple myeloma not having achieved remission: Secondary | ICD-10-CM | POA: Insufficient documentation

## 2014-02-04 DIAGNOSIS — Z951 Presence of aortocoronary bypass graft: Secondary | ICD-10-CM | POA: Insufficient documentation

## 2014-02-04 DIAGNOSIS — I6529 Occlusion and stenosis of unspecified carotid artery: Secondary | ICD-10-CM | POA: Insufficient documentation

## 2014-02-04 DIAGNOSIS — M1120 Other chondrocalcinosis, unspecified site: Secondary | ICD-10-CM | POA: Insufficient documentation

## 2014-02-04 DIAGNOSIS — I517 Cardiomegaly: Secondary | ICD-10-CM | POA: Insufficient documentation

## 2014-02-04 DIAGNOSIS — M47817 Spondylosis without myelopathy or radiculopathy, lumbosacral region: Secondary | ICD-10-CM | POA: Insufficient documentation

## 2014-02-04 LAB — CBC WITH DIFFERENTIAL/PLATELET
BASO%: 0.6 % (ref 0.0–2.0)
BASOS ABS: 0 10*3/uL (ref 0.0–0.1)
EOS ABS: 0.1 10*3/uL (ref 0.0–0.5)
EOS%: 1.1 % (ref 0.0–7.0)
HEMATOCRIT: 28.2 % — AB (ref 38.4–49.9)
HEMOGLOBIN: 9.4 g/dL — AB (ref 13.0–17.1)
LYMPH%: 26.8 % (ref 14.0–49.0)
MCH: 29.3 pg (ref 27.2–33.4)
MCHC: 33.2 g/dL (ref 32.0–36.0)
MCV: 88.2 fL (ref 79.3–98.0)
MONO#: 0.6 10*3/uL (ref 0.1–0.9)
MONO%: 9.8 % (ref 0.0–14.0)
NEUT%: 61.7 % (ref 39.0–75.0)
NEUTROS ABS: 3.9 10*3/uL (ref 1.5–6.5)
PLATELETS: 207 10*3/uL (ref 140–400)
RBC: 3.2 10*6/uL — ABNORMAL LOW (ref 4.20–5.82)
RDW: 16.2 % — ABNORMAL HIGH (ref 11.0–14.6)
WBC: 6.4 10*3/uL (ref 4.0–10.3)
lymph#: 1.7 10*3/uL (ref 0.9–3.3)

## 2014-02-04 LAB — COMPREHENSIVE METABOLIC PANEL (CC13)
ALK PHOS: 53 U/L (ref 40–150)
ALT: 16 U/L (ref 0–55)
ANION GAP: 8 meq/L (ref 3–11)
AST: 20 U/L (ref 5–34)
Albumin: 3.7 g/dL (ref 3.5–5.0)
BILIRUBIN TOTAL: 0.38 mg/dL (ref 0.20–1.20)
BUN: 24 mg/dL (ref 7.0–26.0)
CO2: 23 mEq/L (ref 22–29)
Calcium: 9.7 mg/dL (ref 8.4–10.4)
Chloride: 99 mEq/L (ref 98–109)
Creatinine: 1.2 mg/dL (ref 0.7–1.3)
GLUCOSE: 138 mg/dL (ref 70–140)
Potassium: 5.1 mEq/L (ref 3.5–5.1)
Sodium: 130 mEq/L — ABNORMAL LOW (ref 136–145)
TOTAL PROTEIN: 7.2 g/dL (ref 6.4–8.3)

## 2014-02-04 LAB — PROTEIN / CREATININE RATIO, URINE
Creatinine, Urine: 32 mg/dL
Protein Creatinine Ratio: 0.16 — ABNORMAL HIGH (ref <0.15)
TOTAL PROTEIN, URINE: 5 mg/dL

## 2014-02-05 LAB — KAPPA/LAMBDA LIGHT CHAINS
KAPPA LAMBDA RATIO: 31.88 — AB (ref 0.26–1.65)
Kappa free light chain: 69.5 mg/dL — ABNORMAL HIGH (ref 0.33–1.94)
LAMBDA FREE LGHT CHN: 2.18 mg/dL (ref 0.57–2.63)

## 2014-02-06 ENCOUNTER — Encounter: Payer: Self-pay | Admitting: Physician Assistant

## 2014-02-06 ENCOUNTER — Ambulatory Visit (INDEPENDENT_AMBULATORY_CARE_PROVIDER_SITE_OTHER): Payer: Medicare Other | Admitting: Physician Assistant

## 2014-02-06 VITALS — BP 130/70 | HR 76 | Ht 70.0 in | Wt 189.0 lb

## 2014-02-06 DIAGNOSIS — I2589 Other forms of chronic ischemic heart disease: Secondary | ICD-10-CM

## 2014-02-06 DIAGNOSIS — I10 Essential (primary) hypertension: Secondary | ICD-10-CM

## 2014-02-06 DIAGNOSIS — I739 Peripheral vascular disease, unspecified: Secondary | ICD-10-CM

## 2014-02-06 DIAGNOSIS — I255 Ischemic cardiomyopathy: Secondary | ICD-10-CM

## 2014-02-06 DIAGNOSIS — I5022 Chronic systolic (congestive) heart failure: Secondary | ICD-10-CM

## 2014-02-06 DIAGNOSIS — E785 Hyperlipidemia, unspecified: Secondary | ICD-10-CM

## 2014-02-06 DIAGNOSIS — I509 Heart failure, unspecified: Secondary | ICD-10-CM

## 2014-02-06 DIAGNOSIS — N289 Disorder of kidney and ureter, unspecified: Secondary | ICD-10-CM

## 2014-02-06 DIAGNOSIS — I2510 Atherosclerotic heart disease of native coronary artery without angina pectoris: Secondary | ICD-10-CM

## 2014-02-06 NOTE — Patient Instructions (Addendum)
NEEDS 1ST AVAILABLE NEW PT [facility] APPT DX PAD   Your physician recommends that you continue on your current medications as directed. Please refer to the Current Medication list given to you today.  Your physician wants you to follow-up in: 4 MONTHS WITH DR. Martinique OR LORI GERHARDT. You will receive a reminder letter in the mail two months in advance. If you don't receive a letter, please call our office to schedule the follow-up appointment.

## 2014-02-06 NOTE — Progress Notes (Signed)
[place], [place] [place], [place]  [address] Phone: [phone] Fax:  [phone]  Date:  02/06/2014   ID:  Timothy [name], DOB Mar 13, 1928, MRN [id number]  PCP:  Timothy Cruel, MD  Cardiologist:  Dr. Peter [name]     History of Present Illness: Timothy [name] is a 80 y.o. male with a history of CAD, status post CABG in 2002, ischemic cardiomyopathy, chronic systolic CHF, multiple myeloma, prior to amputations for osteomyelitis, CKD, HL, anemia, neuropathy, gout.  Last seen by Dr. Martinique 07/2013. He was given Lasix for one week due to some volume excess.   He is been followed by podiatry for left second toe ulcer as well as left heel ulcer. ABIs 01/2014 were abnormal with suggestion of significant inflow disease on the left by CFA wave forms and greater than 50% left SFA disease; distal right SFA with short area of occlusion versus calcific shadowing.    He notes his L heel ulcer has been slow to heal since he was in rehab in 08/2013 since his R hip surgery.  He notes L lateral thigh pain with activity that relieves with rest.  No fevers or chills.  The patient denies chest pain, significant shortness of breath, syncope, orthopnea, PND.   Lexiscan Myoview (12/18/12):  Inf and IL scar with minimal peri-infarct ischemia, EF 41%; Intermediate Risk.  Med Rx continued.  Echo (07/18/13):  EF 35-40%, inf AK (c/w scar), mod LAE, mild RVE.  Recent Labs: 08/13/2013: HDL Cholesterol 37*; LDL (calc) 50  02/04/2014: ALT 16; Creatinine 1.2; Hemoglobin 9.4*; Potassium 5.1 02/04/2014: Sodium 130*    Wt Readings from Last 3 Encounters:  02/06/14 189 lb (85.73 kg)  12/03/13 187 lb 3.2 oz (84.913 kg)  09/04/13 173 lb 8 oz (78.7 kg)     Past Medical History  Diagnosis Date  . ASCVD (arteriosclerotic cardiovascular disease)   . Anemia     chronic mild anemia  . Gout   . Hypertension   . Lumbar disc disease     post laminectomy  . Chronic back pain   . History of echocardiogram 11/02/2010     EF range of 30 to 35% / There is hypokinsesis of the basal-mild inferolateral myocardium  . CHF (congestive heart failure)   . Heart murmur   . Ischemic cardiomyopathy   . Renal insufficiency   . Peripheral neuropathy   . Inferior myocardial infarction 1986  . Hyperlipidemia   . SOB (shortness of breath)   . Fatigue   . Peripheral neuropathy   . Coronary artery disease     CABG 2002 by Dr. Roxy [name] with LIMA to LAD, SVG to intermediate, SVG to LCX, & SVG to PL. Last nuclear in 2012 showing large inferior scar with EF of 39%.   . Blood transfusion     DEC 2012 - TWO UNITS  . Myeloma     Multiple myeloma, with recurrence of increasing problems  DR. Toxey -ONCOLOGIST.  PT HAS BEEN OFF CHEMO SINCE HIS HOSPITALIZATION FEB 2013 FOR LEFT FOOT INFECTION  . Cancer   . History of shingles MARCH 2009    SHINGLE LESIONS WERE AROUND RIGHT EYE--PT HAS RESIDUAL ITCHING AROUND THE EYE.  . Osteomyelitis     s/p toe amputation  . Chronic systolic heart failure     EF of 30% per echo 02/2012  . Protein malnutrition     Current Outpatient Prescriptions  Medication Sig Dispense Refill  . allopurinol (ZYLOPRIM) 300 MG tablet Take  0.5 tablets (150 mg total) by mouth daily.  90 tablet  0  . aspirin (BAYER ASPIRIN) 325 MG tablet Take 1 tablet (325 mg total) by mouth daily.  60 tablet  0  . atorvastatin (LIPITOR) 10 MG tablet Take 1 tablet (10 mg total) by mouth daily.  90 tablet  3  . B Complex-Biotin-FA (B COMPLETE PO) Take 0.5 tablets by mouth 2 (two) times daily.       . carvedilol (COREG) 6.25 MG tablet Take 6.25 mg by mouth 2 (two) times daily with a meal.      . Coenzyme Q10 (CO Q-10 PO) Take 1 tablet by mouth every morning.       . DULoxetine (CYMBALTA) 30 MG capsule Take 30 mg by mouth every evening.      Marland Kitchen HYDROmorphone (DILAUDID) 4 MG tablet Take 1.5 tablets (6 mg total) by mouth every 6 (six) hours as needed.  120 tablet  0  . lisinopril (PRINIVIL,ZESTRIL) 20 MG tablet TAKE 1 TABLET DAILY   90 tablet  0  . Multiple Vitamin (MULTIVITAMIN) capsule Take 1 capsule by mouth every morning.       . nitroGLYCERIN (NITROSTAT) 0.4 MG SL tablet Place 0.4 mg under the tongue every 5 (five) minutes as needed for chest pain.      . polyethylene glycol (MIRALAX / GLYCOLAX) packet Take 17 g by mouth daily as needed.  14 each  0  . rOPINIRole (REQUIP) 0.5 MG tablet Take 0.25 mg 1-3 hours before bed for 2 days, then may increase to 0.5 mq before bed for 5 days.  If it still needs to be increased, you may increase by 0.5 mg per week until you reach 3 mg.  60 tablet  3  . tamsulosin (FLOMAX) 0.4 MG CAPS capsule Take 0.4 mg by mouth at bedtime.      Marland Kitchen tobramycin (TOBREX) 0.3 % ophthalmic solution       . vitamin C (ASCORBIC ACID) 500 MG tablet Take 500 mg by mouth every morning.       No current facility-administered medications for this visit.    Allergies:   Morphine and related   Social History:  The patient  reports that he quit smoking about 29 years ago. His smoking use included Cigarettes. He has a 20 pack-year smoking history. He has never used smokeless tobacco. He reports that he does not drink alcohol or use illicit drugs.   Family History:  The patient's family history is not on file.   ROS:  Please see the history of present illness.      All other systems reviewed and negative.   PHYSICAL EXAM: VS:  BP 130/70  Pulse 76  Ht _0  (1.778 m)  Wt 189 lb (85.73 kg)  BMI 27.12 kg/m2 Well nourished, well developed, in no acute distress HEENT: normal Neck: no JVD Cardiac:  normal S1, S2; RRR; no murmur Lungs:  clear to auscultation bilaterally, no wheezing, rhonchi or rales Abd: soft, nontender, no hepatomegaly Ext: trace left LE edema;  L heel dressing dry and intact; foot warm Skin: warm and dry Neuro:  CNs 2-12 intact, no focal abnormalities noted      ASSESSMENT AND PLAN:  1. PAD:  He has LLE claudication symptoms and L heel ulcer  and his arterial studies suggest  significant L CFA disease.  Will refer to Dr. Kathlyn [name] or Dr. Quay [name] next available appt. 2. CAD:  No angina.  Continue ASA, statin. 3. Ischemic  CM:  Continue beta blocker, ACEI. 4. Chronic Systolic CHF:  Volume stable.  Recent creatinine stable.  5. Chronic Kidney Disease:  Recent creatinine ok. 6. Multiple Myeloma:  Continue f/u with oncology.  7. Hyperlipidemia:  Continue statin.  8. Hypertension:  Controlled.  9. Disposition:  Refer to PV.  F/u with Dr. Peter [name] or Truitt Merle, NP in 4 mos.   Signed, Richardson Dopp, PA-C  02/06/2014 9:08 AM

## 2014-02-09 ENCOUNTER — Other Ambulatory Visit: Payer: Self-pay | Admitting: Nurse Practitioner

## 2014-02-09 ENCOUNTER — Other Ambulatory Visit: Payer: Self-pay | Admitting: Cardiology

## 2014-02-10 ENCOUNTER — Ambulatory Visit: Payer: Medicare Other | Admitting: Cardiology

## 2014-02-11 ENCOUNTER — Ambulatory Visit (HOSPITAL_BASED_OUTPATIENT_CLINIC_OR_DEPARTMENT_OTHER): Payer: Medicare Other | Admitting: Oncology

## 2014-02-11 ENCOUNTER — Telehealth: Payer: Self-pay | Admitting: Oncology

## 2014-02-11 VITALS — BP 114/69 | HR 81 | Temp 97.9°F | Resp 18 | Ht 70.0 in | Wt 187.1 lb

## 2014-02-11 DIAGNOSIS — C9000 Multiple myeloma not having achieved remission: Secondary | ICD-10-CM

## 2014-02-11 DIAGNOSIS — L97529 Non-pressure chronic ulcer of other part of left foot with unspecified severity: Secondary | ICD-10-CM

## 2014-02-11 DIAGNOSIS — I5023 Acute on chronic systolic (congestive) heart failure: Secondary | ICD-10-CM

## 2014-02-11 DIAGNOSIS — D62 Acute posthemorrhagic anemia: Secondary | ICD-10-CM

## 2014-02-11 DIAGNOSIS — D649 Anemia, unspecified: Secondary | ICD-10-CM

## 2014-02-11 DIAGNOSIS — G5790 Unspecified mononeuropathy of unspecified lower limb: Secondary | ICD-10-CM

## 2014-02-11 DIAGNOSIS — I2510 Atherosclerotic heart disease of native coronary artery without angina pectoris: Secondary | ICD-10-CM

## 2014-02-11 DIAGNOSIS — E876 Hypokalemia: Secondary | ICD-10-CM

## 2014-02-11 NOTE — Telephone Encounter (Signed)
gve the pt's wife the April-sept 2015 appt calendar.

## 2014-02-11 NOTE — Progress Notes (Signed)
ID: Timothy [name]   DOB: [birth year]  MR#: [id number]  EUM#:[id number]  PCP:  SU: OTHER MD: Dorna Leitz, Alysia Penna, Georgia Duff  HISTORY OF PRESENT ILLNESS: Patient presented in April 2007 with symptomatic slowly progressive anemia, with normal iron parameters, folic acid, and vitamin B12. Plans were made to obtain bone marrow aspirate and biopsy as an outpatient, however the patient was hospitalized shortly thereafter with a creatinine of 8.6. Renal biopsy demonstrated the presence of  light chain deposition disease. Bone marrow biopsy demonstrated 37% plasma cells. A skeletal survey showed small calvarial lytic lesions, mild osteopenia, and cervical spondylosis. With a well-established diagnosis of light-chain multiple myeloma Dr. Melodie Bouillon subsequent treatments are as detailed below.   INTERVAL HISTORY: [name] returns today with his wife, [name], for followup of his multiple myeloma. Since the last visit here he had the second toe of his left foot amputated. He is seeing cardiology for possible angioplasty or in any case for evaluation of the circulation of the left lower extremity. He is also seeing podiatry (Dr Rolley Sims). Incidentally [name] is about to have surgery to remove a synovial cyst associated with her right medial collarbone  REVIEW OF SYSTEMS: [name] uses a walker partly because of the foot problem and partly because of continuing balance issues. Some of this is going to be due to his neuropathy from prior treatments. His pain is moderately well-controlled, and he continues on Dilaudid 4 mg one half tablets once or twice a day as needed. This does not constipate him. There has been no fever, rash, or bleeding. A detailed review of systems today was otherwise stable   PAST MEDICAL HISTORY: Past Medical History  Diagnosis Date  . ASCVD (arteriosclerotic cardiovascular disease)   . Anemia     chronic mild anemia  . Gout   .  Hypertension   . Lumbar disc disease     post laminectomy  . Chronic back pain   . History of echocardiogram 11/02/2010    EF range of 30 to 35% / There is hypokinsesis of the basal-mild inferolateral myocardium  . CHF (congestive heart failure)   . Heart murmur   . Ischemic cardiomyopathy   . Renal insufficiency   . Peripheral neuropathy   . Inferior myocardial infarction 1986  . Hyperlipidemia   . SOB (shortness of breath)   . Fatigue   . Peripheral neuropathy   . Coronary artery disease     CABG 2002 by Dr. Roxy Manns with LIMA to LAD, SVG to intermediate, SVG to LCX, & SVG to PL. Last nuclear in 2012 showing large inferior scar with EF of 78%   . Blood transfusion     DEC 2012 - TWO UNITS  . Myeloma     Multiple myeloma, with recurrence of increasing problems  DR. Naplate -ONCOLOGIST.  PT HAS BEEN OFF CHEMO SINCE HIS HOSPITALIZATION FEB 2013 FOR LEFT FOOT INFECTION  . Cancer   . History of shingles MARCH 2009    SHINGLE LESIONS WERE AROUND RIGHT EYE--PT HAS RESIDUAL ITCHING AROUND THE EYE.  . Osteomyelitis     s/p toe amputation  . Chronic systolic heart failure     EF of 30% per echo 02/2012  . Protein malnutrition    PAST SURGICAL HISTORY: Past Surgical History  Procedure Laterality Date  . Laminectomy    . Inguinal hernia repair    . Cardiac catheterization  06/20/2000    Severe coronary disease (totally occluded right artery, 90%  left circumflex, 50-60% intermediate, and 90-95% ostial left anterior descending)   . Tonsillectomy    . Coronary artery bypass graft  2002    x4 / Left internal mammary artery to the LAD.  / Saphenous vein graft to the right posterolateral.  /  Saphenous vein graft to  the ramus intermedius. / Saphenous vein graft to the circumflex marginal     . Amputation  03/06/2012    Procedure: AMPUTATION RAY;  Surgeon: Alta Corning, MD;  Location: [facility];  Service: Orthopedics;  Laterality: Left;  First Ray Amputation  . 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   ADVANCED DIRECTIVES:  HEALTH MAINTENANCE: History  Substance Use Topics  . Smoking status: Former Smoker -- 1.00 packs/day for 20 years    Types: Cigarettes    Quit date: 11/20/1984  . Smokeless tobacco: Never Used  . Alcohol Use: No     Colonoscopy:  Bone density:  Lipid panel:  Allergies  Allergen Reactions  . Morphine And Related Other (See Comments)    Agitated with morphine drip    Current Outpatient Prescriptions  Medication Sig Dispense Refill  . allopurinol (ZYLOPRIM) 300 MG tablet Take 0.5 tablets (150 mg total) by mouth daily.  90 tablet  0  . aspirin (BAYER ASPIRIN) 325 MG tablet Take 1 tablet (325 mg total) by mouth daily.  60 tablet  0  . atorvastatin (LIPITOR) 10 MG tablet Take 1 tablet (10 mg total) by mouth daily.  90 tablet  3  . B Complex-Biotin-FA (B COMPLETE PO) Take 0.5 tablets by mouth 2 (two) times daily.       . carvedilol (COREG) 6.25 MG tablet TAKE 1 TABLET TWICE A DAY  180 tablet  1  . Coenzyme Q10 (CO Q-10 PO) Take 1 tablet by mouth every morning.       . DULoxetine (CYMBALTA) 30 MG capsule Take 30 mg by mouth every evening.      Marland Kitchen HYDROmorphone (DILAUDID) 4 MG tablet Take 1.5 tablets (6 mg total) by mouth every 6 (six) hours as needed.  120 tablet  0  . lisinopril (PRINIVIL,ZESTRIL) 20 MG tablet TAKE 1 TABLET DAILY  90 tablet  2  . Multiple Vitamin (MULTIVITAMIN) capsule Take 1 capsule by mouth every morning.       . nitroGLYCERIN (NITROSTAT) 0.4 MG SL tablet Place 0.4 mg under the tongue every 5 (five) minutes as needed for chest pain.      . polyethylene glycol  (MIRALAX / GLYCOLAX) packet Take 17 g by mouth daily as needed.  14 each  0  . rOPINIRole (REQUIP) 0.5 MG tablet Take 0.25 mg 1-3 hours before bed for 2 days, then may increase to 0.5 mq before bed for 5 days.  If it still needs to be increased, you may increase by 0.5 mg per week until you reach 3 mg.  60 tablet  3  . tamsulosin (FLOMAX) 0.4 MG CAPS capsule Take 0.4 mg by mouth at bedtime.      Marland Kitchen tobramycin (TOBREX) 0.3 % ophthalmic solution       . vitamin C (ASCORBIC ACID) 500 MG tablet Take 500 mg by mouth every  morning.       No current facility-administered medications for this visit.    Objective: Older white male using a walker Filed Vitals:   02/11/14 1105  BP: 114/69  Pulse: 81  Temp: 97.9 F (36.6 C)  Resp: 18        Body mass index is 26.85 kg/(m^2).    ECOG FS: 2 Filed Weights   02/11/14 1105  Weight: 187 lb 1.6 oz (84.868 kg)   Sclerae unicteric, pupils round and equal Oropharynx clear, slightly dry No peripheral adenopathy Lungs clear bilaterally  Heart regular rate and rhythm Abdomen soft, nontender, positive bowel sounds MSK left foot is status post remote big toe amputation and more recent second toe amputation. There is granulation tissue at the base and some swelling, which appears not infected. The foot is itself is pink and warm Neuro: nonfocal, well oriented, positive affect   LAB RESULTS:  Results for TIMTOHY, BROSKI (MRN [id number]) as of 02/11/2014 11:23  Ref. Range 10/01/2013 10:51 10/29/2013 10:55 11/26/2013 11:21 12/31/2013 11:05 02/04/2014 11:13  Kappa free light chain Latest Range: 0.33-1.94 mg/dL 46.90 (H) 49.00 (H) 55.80 (H) 43.00 (H) 69.50 (H)  Lambda Free Lght Chn Latest Range: 0.57-2.63 mg/dL 2.56 2.83 (H) 2.89 (H) 2.56 2.18  Kappa:Lambda Ratio Latest Range: 0.26-1.65  18.32 (H) 17.31 (H) 19.31 (H) 16.80 (H) 31.88 (H)    Lab Results  Component Value Date   WBC 6.4 02/04/2014   NEUTROABS 3.9 02/04/2014   HGB 9.4* 02/04/2014   HCT 28.2*  02/04/2014   MCV 88.2 02/04/2014   PLT 207 02/04/2014      Chemistry      Component Value Date/Time   NA 130* 02/04/2014 1113   NA 132* 09/02/2013 0500   K 5.1 02/04/2014 1113   K 4.0 09/02/2013 0500   CL 99 09/02/2013 0500   CL 98 05/12/2013 1048   CO2 23 02/04/2014 1113   CO2 24 09/02/2013 0500   BUN 24.0 02/04/2014 1113   BUN 25* 09/02/2013 0500   CREATININE 1.2 02/04/2014 1113   CREATININE 1.01 09/02/2013 0500      Component Value Date/Time   CALCIUM 9.7 02/04/2014 1113   CALCIUM 8.2* 09/02/2013 0500   ALKPHOS 53 02/04/2014 1113   ALKPHOS 40 08/22/2013 0620   AST 20 02/04/2014 1113   AST 28 08/22/2013 0620   ALT 16 02/04/2014 1113   ALT 22 08/22/2013 0620   BILITOT 0.38 02/04/2014 1113   BILITOT 1.2 08/22/2013 0620      STUDIES: Dg Bone Survey Met  02/04/2014   CLINICAL DATA:  Myeloma  EXAM: METASTATIC BONE SURVEY  COMPARISON:  DG BONE SURVEY MET dated 03/01/2012  FINDINGS: Mild cardiomegaly. Prior CABG. Low lung volumes. Cervical spondylosis. Carotid atherosclerosis. Lumbar scoliosis, spondylosis, and degenerative disc disease. Aortoiliac atherosclerosis. Interval right hip intramedullary nail spanning prior intertrochanteric fracture. Chondrocalcinosis. Bony demineralization.  No lytic lesions characteristic of myeloma observed.  IMPRESSION: 1. No lytic lesions characteristic of myeloma are identified. 2. Cardiomegaly. 3. Atherosclerosis. 4. Spondylosis. 5. Chondrocalcinosis.   Electronically Signed   By: Sherryl Barters M.D.   On: 02/04/2014 15:19    ASSESSMENT: 80 y.o.  with kappa light chain multiple myeloma  (1) presenting April 2007 with anemia and renal failure; renal Bx showing free kappa light chain deposition; bone marrow biopsy showing 37% plasma cells, with normal cytogenetics and FISH;bone survey showing skull lytic lesions; treated with  (2) thalidomide (200 mg/d) and dexamethasone (40 mg/d x4d Q28d) June through Nov  2007, with good response, but poor tolerance;  (3)  thalidomide decreased to 100 mg/d, dexamethasone continued, bortezomib (IV) added, Dec 2007 to March 2008  (4) treatment interrupted by multiple complications (peripheral neuropathy, pulmonary embolism, diverticular abscess, CN V zoster, severe DDD, congestive heart failure, rising PSA); maintenance zolendronic acid through March 2012  (5) progression April 2012, treated initially with dexamethasone alone, poorly tolerated;  (6) bortezomib (sq) resumed July 2012; cyclophosphamide and dexamethasone added Sept 2012; zolendronic acid changed to Q 65months; all treatments held as of March 2013 due to the development of the Left foot ulcer described above (7) anemia, on aranesp December 2012 to August 2013 without significant response  (8) status post Right trochanteric nail with proximal and distal interlock  DePuy 11 mm one-pin lag screw, 44 mm distal interlock 08/09/2013 for a right comminuted intertrochanteric fracture (9) status post removal of the first 2 toes left foot    PLAN:   There has been a slight "bump" in Dick's kappa light chains, but no definite trend as well. Given all the other problems he is having, I do think it is prudent to continue to watch closely but not intervene. He will have labs every month and I will see him again in approximately 3-1/2 months. Of course if there is a definite trend upwards I will see him earlier.  I have cautioned him that if any contrast IV studies are going to be planned, he should be very well hydrated both before and after to prevent damage to his kidneys, which are quite tolerable given his kappa light chains.  I am hoping [facility] surgery goes well. We do not anticipate any unusual findings there.  Diagnosed to call for any problems that may develop before his next visit here.  Maekayla Giorgio C    02/11/2014

## 2014-02-12 ENCOUNTER — Encounter: Payer: Self-pay | Admitting: Oncology

## 2014-02-12 ENCOUNTER — Other Ambulatory Visit: Payer: Self-pay | Admitting: Physical Medicine & Rehabilitation

## 2014-02-12 MED ORDER — HYDROMORPHONE HCL 4 MG PO TABS: 6.0000 mg | ORAL_TABLET | Freq: Four times a day (QID) | ORAL | Status: DC | PRN

## 2014-02-12 NOTE — Addendum Note (Signed)
Addended by: Laureen Abrahams on: 02/12/2014 05:37 PM   Modules accepted: Orders, Medications

## 2014-02-13 ENCOUNTER — Ambulatory Visit: Payer: Medicare Other | Admitting: Cardiovascular Disease

## 2014-02-13 ENCOUNTER — Encounter: Payer: Self-pay | Admitting: Podiatrist

## 2014-02-13 ENCOUNTER — Ambulatory Visit (INDEPENDENT_AMBULATORY_CARE_PROVIDER_SITE_OTHER): Payer: Medicare Other | Admitting: Podiatrist

## 2014-02-13 VITALS — BP 140/65 | HR 62 | Temp 97.7°F | Resp 17 | Ht 70.0 in | Wt 170.0 lb

## 2014-02-13 DIAGNOSIS — M86179 Other acute osteomyelitis, unspecified ankle and foot: Secondary | ICD-10-CM

## 2014-02-13 DIAGNOSIS — I739 Peripheral vascular disease, unspecified: Secondary | ICD-10-CM

## 2014-02-13 DIAGNOSIS — R0989 Other specified symptoms and signs involving the circulatory and respiratory systems: Secondary | ICD-10-CM

## 2014-02-13 DIAGNOSIS — Z09 Encounter for follow-up examination after completed treatment for conditions other than malignant neoplasm: Secondary | ICD-10-CM

## 2014-02-13 NOTE — Progress Notes (Signed)
   Subjective: Dr. Clemetine Marker presents today or his postoperative followup regarding amputation of the middle and distal phalanx of the left second toe and for continued care of the left heel ulcer. [name] states that the area around the toe is red and swollen and it's not looking as good as it was. There is no pain in the area however it keeps draining and is swollen and red. The patient denies any constitutional signs of infection. He also saw his cardiologist PA who recommended he see Dr. Dereck Ligas regarding the circulation study that was performed. He has an appointment there April 14. He also relates in conversation that he's been getting cramping on the left thigh and burning on the right thigh. This most likely correlates with the vascular study that was most recently performed and it's results.  Objective: Nonpalpable pedal pulse is noted left. The swelling has decreased significantly on the left foot. The head of the proximal phalanx is visible at the second toe. The bone itself does not appear to be degraded. The skin around the toe however is not healing normally. Minimal redness is along the medial border of the incision site. Clear fluid is also present. No malodor is present, no pus, no streaking is seen. The left heel ulcer appears to be healing at and extremely slow rate but it is improving slightly. Main concern today is the circulation as well as the nonhealing nature of the amputation site left.  Assessment: Status post partial amputation left second toe and heel ulcer left  Plan: Recommended continuing with local wound care until we can get the circulation assessed fully.  Because the toe has not healed I do not want to do any additional surgery until the circulation is hopefully improved. I will go in and perform a total second digit amputation once the circulation is improved. If however his cardiologist would like to perform this procedure he is more than welcome to remove the remainder of  the toe himself. I am going to order the silver collagen dressing for the heel ulceration and [name] is given instructions on how to apply it. She will continue applying Iodosorb to the left second toe and keep it clean. I will see him back in 3-4 weeks once the circulation has been assessed. If he notices any increased redness, swelling, signs of infection or pain he will call me immediately.

## 2014-02-13 NOTE — Patient Instructions (Signed)
I will order the collagen dressing through [facility]-- if you have not received the dressing or heard from their company by next week, please call and let me know.

## 2014-02-16 ENCOUNTER — Other Ambulatory Visit: Payer: Self-pay | Admitting: *Deleted

## 2014-02-16 DIAGNOSIS — C9000 Multiple myeloma not having achieved remission: Secondary | ICD-10-CM

## 2014-02-16 DIAGNOSIS — G609 Hereditary and idiopathic neuropathy, unspecified: Secondary | ICD-10-CM

## 2014-02-16 MED ORDER — DULOXETINE HCL 60 MG PO CPEP: 120.0000 mg | ORAL_CAPSULE | Freq: Every evening | ORAL | Status: DC

## 2014-02-17 ENCOUNTER — Ambulatory Visit: Payer: Medicare Other | Admitting: Cardiology

## 2014-02-17 ENCOUNTER — Telehealth: Payer: Self-pay | Admitting: *Deleted

## 2014-02-17 NOTE — Telephone Encounter (Signed)
Dr. Valentina Lucks ordered Collagen W/ Silver 30 units per month, apply daily to left heel from [facility] 707.15, 6x4x5 mm, left heel  [facility] Ph # [phone] Fax #  [phone]

## 2014-02-19 ENCOUNTER — Telehealth: Payer: Self-pay | Admitting: Cardiovascular Disease

## 2014-02-19 NOTE — Telephone Encounter (Signed)
This is a new PAD pt being sent to Dr Julianne Handler, 03/03/14 2:30 pm Request sooner app due to severe claudication/ inability to walk over 4-5 steps.  Please review Richardson Dopp PA-C note.

## 2014-02-19 NOTE — Telephone Encounter (Signed)
New Message:  Pt's wife states her husband is having a lot of difficulty walking.. States he only walks 4 or 5 steps and then has to sit down due to extreme pain in his left leg... Pt's wife is requesting her husband be worked in sooner w/ Dr. Angelena Form. She would appreciate a call back from the nurse.

## 2014-02-19 NOTE — Telephone Encounter (Signed)
Spoke with pt wife, aware dr mcalhany's schedule is full. Will discuss with him tomorrow and see what we can do. Pt wife agreed with this plan.

## 2014-02-19 NOTE — Telephone Encounter (Signed)
He is Timothy [name] patient. Gerald Stabs

## 2014-02-19 NOTE — Telephone Encounter (Signed)
I do not see new PV patients anymore. He should be seen by Dr. Gwenlyn Found or Dr. Fletcher Anon. Thanks, chris

## 2014-02-20 ENCOUNTER — Encounter: Payer: Self-pay | Admitting: Cardiovascular Disease

## 2014-02-20 ENCOUNTER — Ambulatory Visit (INDEPENDENT_AMBULATORY_CARE_PROVIDER_SITE_OTHER): Payer: Medicare Other | Admitting: Cardiovascular Disease

## 2014-02-20 VITALS — BP 137/59 | HR 71 | Ht 70.0 in | Wt 191.0 lb

## 2014-02-20 DIAGNOSIS — I998 Other disorder of circulatory system: Secondary | ICD-10-CM

## 2014-02-20 DIAGNOSIS — I999 Unspecified disorder of circulatory system: Secondary | ICD-10-CM

## 2014-02-20 DIAGNOSIS — I2510 Atherosclerotic heart disease of native coronary artery without angina pectoris: Secondary | ICD-10-CM

## 2014-02-20 DIAGNOSIS — I70229 Atherosclerosis of native arteries of extremities with rest pain, unspecified extremity: Secondary | ICD-10-CM | POA: Insufficient documentation

## 2014-02-20 NOTE — Patient Instructions (Signed)
Dr. Gwenlyn Found has ordered a peripheral angiogram to be done at [facility].  This procedure is going to look at the bloodflow in your lower extremities.  If Dr. Gwenlyn Found is able to open up the arteries, you will have to spend one night in the hospital.  If he is not able to open the arteries, you will be able to go home that same day.    After the procedure, you will not be allowed to drive for 3 days or push, pull, or lift anything greater than 10 lbs for one week.    You will be admitted to the hospital the night before your procedure for hydration.    Reps - Meriel Pica

## 2014-02-20 NOTE — Assessment & Plan Note (Signed)
Mr. Timothy [name] was referred to me by [name] for severe lifestyle and claudication and critical limb ischemia. He has a heel ulcer on his left foot as had his left first toe implicated in the past. He has severe lifestyle limiting claudication on that side. Dopplers performed 01/26/14 revealed a high-frequency signal at the origin of the left SFA and occluded distal right SFA. He did have a right heel ulcer which is slowly healing. This occurred as a result of a bed sore during rehabilitation from a broken hip in a fall last year. He does have multiple myeloma which places him at increased risk of recurrent chest nephropathy. Also has moderate LV dysfunction with an ejection fraction in the 40% range. I'm going to admit him to the floor his angiogram for hydration and performed lower cineangiography and potential endovascular therapy.

## 2014-02-20 NOTE — Progress Notes (Signed)
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 Peter [name] for peripheral vascular evaluation because of left eye limiting claudication and poorly healing ulcers on his feet. His podiatrist is Dr. Anselm [name] and his oncologist Dr. Gunnar Bulla [name]. He has a history of hypertension and hyperlipidemia as well as family history of heart disease. He had coronary artery bypass grafting x4 by Dr. Roxy [name] in 2002. A Myoview stress test on January of last year showed scar without ischemia and a 2-D echo revealed an ejection fraction of 35-40%. He denies chest pain or shortness of breath. He also has multiple myeloma which has been quiet and since February 2013 when he received his last chemotherapy treatment. He complains of  Left lower extremity lifestyle limiting claudication. He has a slowly healing ulcer on his left heel as well.he has had amputation of his left great toe and a portion of his left second toe.  Current Outpatient Prescriptions  Medication Sig Dispense Refill  . allopurinol (ZYLOPRIM) 300 MG tablet Take 0.5 tablets (150 mg total) by mouth daily.  90 tablet  0  . aspirin EC 81 MG tablet Take 81 mg by mouth 3 (three) times a week.      Marland Kitchen atorvastatin (LIPITOR) 10 MG tablet Take 1 tablet (10 mg total) by mouth daily.  90 tablet  3  . B Complex-Biotin-FA (B COMPLETE PO) Take 0.5 tablets by mouth 2 (two) times daily.       . carvedilol (COREG) 6.25 MG tablet TAKE 1 TABLET TWICE A DAY  180 tablet  1  . Coenzyme Q10 (CO Q-10 PO) Take 1 tablet by mouth every morning.       . DULoxetine (CYMBALTA) 60 MG capsule Take 2 capsules (120 mg total) by mouth every evening.  180 capsule  1  . HYDROmorphone (DILAUDID) 4 MG tablet Take 1.5 tablets (6 mg total) by mouth every  6 (six) hours as needed.  120 tablet  0  . lisinopril (PRINIVIL,ZESTRIL) 20 MG tablet TAKE 1 TABLET DAILY  90 tablet  2  . Multiple Vitamin (MULTIVITAMIN) capsule Take 1 capsule by mouth every morning.       . nitroGLYCERIN (NITROSTAT) 0.4 MG SL tablet Place 0.4 mg under the tongue every 5 (five) minutes as needed for chest pain.      . polyethylene glycol (MIRALAX / GLYCOLAX) packet Take 17 g by mouth daily as needed.  14 each  0  . rOPINIRole (REQUIP) 0.5 MG tablet 0.15 mg. Take 0.25 mg 1-3 hours before bed for 2 days, then may increase to 0.5 mq before bed for 5 days.  If it still needs to be increased, you may increase by 0.5 mg per week until you reach 3 mg.      . tamsulosin (FLOMAX) 0.4 MG CAPS capsule Take 0.4 mg by mouth at bedtime.      Marland Kitchen tobramycin (TOBREX) 0.3 % ophthalmic solution Place 1 drop into both eyes as needed.       . vitamin C (ASCORBIC ACID) 500 MG tablet Take 500 mg by mouth every morning.       No current facility-administered medications for this visit.    Allergies  Allergen Reactions  . Morphine And Related Other (See Comments)    Agitated with  morphine drip    History   Social History  . Marital Status: Married    Spouse Name: N/A    Number of Children: N/A  . Years of Education: N/A   Occupational History  . Not on file.   Social History Main Topics  . Smoking status: Former Smoker -- 1.00 packs/day for 20 years    Types: Cigarettes    Quit date: 11/20/1984  . Smokeless tobacco: Never Used  . Alcohol Use: No  . Drug Use: No  . Sexual Activity: Not Currently   Other Topics Concern  . Not on file   Social History Narrative   Lives at home with wife, has children but not with current wife. Still active, golfing and walking in the yard before the issue with the wound, now using a walker.      Review of Systems: General: negative for chills, fever, night sweats or weight changes.  Cardiovascular: negative for chest pain, dyspnea on exertion,  edema, orthopnea, palpitations, paroxysmal nocturnal dyspnea or shortness of breath Dermatological: negative for rash Respiratory: negative for cough or wheezing Urologic: negative for hematuria Abdominal: negative for nausea, vomiting, diarrhea, bright red blood per rectum, melena, or hematemesis Neurologic: negative for visual changes, syncope, or dizziness All other systems reviewed and are otherwise negative except as noted above.    Blood pressure 137/59, pulse 71, height $RemoveBe'5\' 10"'UmcJoIlEL$  (1.778 m), weight 191 lb (86.637 kg).  General appearance: alert and no distress Neck: no adenopathy, no carotid bruit, no JVD, supple, symmetrical, trachea midline and thyroid not enlarged, symmetric, no tenderness/mass/nodules Lungs: clear to auscultation bilaterally Heart: regular rate and rhythm, S1, S2 normal, no murmur, click, rub or gallop Extremities: extremities normal, atraumatic, no cyanosis or edema and plus femorals with a left femoral bruit. Diminished pedal pulses.  EKG not performed today  ASSESSMENT AND PLAN:   Critical lower limb ischemia Mr. Timothy [name] was referred to me by Dr. Peter [name] for severe lifestyle and claudication and critical limb ischemia. He has a heel ulcer on his left foot as had his left first toe implicated in the past. He has severe lifestyle limiting claudication on that side. Dopplers performed 01/26/14 revealed a high-frequency signal at the origin of the left SFA and occluded distal right SFA. He did have a right heel ulcer which is slowly healing. This occurred as a result of a bed sore during rehabilitation from a broken hip in a fall last year. He does have multiple myeloma which places him at increased risk of recurrent chest nephropathy. Also has moderate LV dysfunction with an ejection fraction in the 40% range. I'm going to admit him to the floor his angiogram for hydration and performed lower cineangiography and potential endovascular therapy.      Timothy Harp MD FACP,FACC,FAHA, [facility] 02/20/2014 3:51 PM

## 2014-02-20 NOTE — Telephone Encounter (Signed)
Called and discussed with dr berry, pt will see him today at 2:45 pm. Pt wife aware of time and location.

## 2014-02-23 ENCOUNTER — Encounter: Payer: Self-pay | Admitting: Cardiovascular Disease

## 2014-02-26 ENCOUNTER — Other Ambulatory Visit: Payer: Self-pay | Admitting: Podiatrist

## 2014-02-26 ENCOUNTER — Telehealth: Payer: Self-pay | Admitting: *Deleted

## 2014-02-26 MED ORDER — ROPINIROLE HCL 0.5 MG PO TABS: 0.5000 mg | ORAL_TABLET | Freq: Every day | ORAL | Status: DC

## 2014-02-26 NOTE — Telephone Encounter (Signed)
He started taking Requip on March 8th.  He thinks it's doing him some good.  He would like Dr Valentina Lucks to call him in a 90 day prescription to [facility].

## 2014-02-26 NOTE — Telephone Encounter (Signed)
OK- done thanks

## 2014-02-27 NOTE — Telephone Encounter (Signed)
I called and informed her Dr. Valentina Lucks e-scribed the prescription to his pharmacy.

## 2014-03-03 ENCOUNTER — Institutional Professional Consult (permissible substitution): Payer: Medicare Other | Admitting: Cardiovascular Disease

## 2014-03-04 ENCOUNTER — Telehealth: Payer: Self-pay | Admitting: *Deleted

## 2014-03-04 ENCOUNTER — Other Ambulatory Visit: Payer: Self-pay | Admitting: Podiatrist

## 2014-03-04 MED ORDER — CEPHALEXIN 500 MG PO CAPS: 500.0000 mg | ORAL_CAPSULE | Freq: Three times a day (TID) | ORAL | Status: DC

## 2014-03-04 NOTE — Telephone Encounter (Signed)
I called and informed the patient that Dr. Valentina Lucks sent his prescription to [facility].

## 2014-03-04 NOTE — Telephone Encounter (Signed)
OK done-- I called it in   Please double check me!  thanks

## 2014-03-04 NOTE — Telephone Encounter (Signed)
He needs a prescription for Cephalexin.  We'd like to have it to use until we can get in to see her again.  We've been through this before.  She's well aware of the process.

## 2014-03-06 ENCOUNTER — Ambulatory Visit (INDEPENDENT_AMBULATORY_CARE_PROVIDER_SITE_OTHER): Payer: Medicare Other | Admitting: Podiatrist

## 2014-03-06 ENCOUNTER — Encounter: Payer: Self-pay | Admitting: Podiatrist

## 2014-03-06 ENCOUNTER — Ambulatory Visit (INDEPENDENT_AMBULATORY_CARE_PROVIDER_SITE_OTHER): Payer: Medicare Other

## 2014-03-06 VITALS — BP 134/57 | HR 76 | Temp 98.0°F | Resp 18 | Ht 70.0 in | Wt 170.0 lb

## 2014-03-06 DIAGNOSIS — M86179 Other acute osteomyelitis, unspecified ankle and foot: Secondary | ICD-10-CM

## 2014-03-06 DIAGNOSIS — I2510 Atherosclerotic heart disease of native coronary artery without angina pectoris: Secondary | ICD-10-CM

## 2014-03-06 DIAGNOSIS — L97529 Non-pressure chronic ulcer of other part of left foot with unspecified severity: Secondary | ICD-10-CM

## 2014-03-06 DIAGNOSIS — L97509 Non-pressure chronic ulcer of other part of unspecified foot with unspecified severity: Secondary | ICD-10-CM

## 2014-03-06 DIAGNOSIS — I739 Peripheral vascular disease, unspecified: Secondary | ICD-10-CM

## 2014-03-06 DIAGNOSIS — M86679 Other chronic osteomyelitis, unspecified ankle and foot: Secondary | ICD-10-CM

## 2014-03-06 DIAGNOSIS — R0989 Other specified symptoms and signs involving the circulatory and respiratory systems: Secondary | ICD-10-CM

## 2014-03-06 NOTE — Progress Notes (Deleted)
   Subjective:    Patient ID: Timothy [name], male    DOB: 03-Jan-1928, 80 y.o.   MRN: [id number]  HPI Comments: Pt presents for post-op check and complains of worsening pain in the foot, and difficulty walking     Review of Systems     Objective:   Physical Exam        Assessment & Plan:

## 2014-03-08 NOTE — Progress Notes (Signed)
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 He relates significant pain in the toe.  He has been using silver alginate on the heel ulcer and states it appears to be improving.  He has an appointment with Dr. Gwenlyn Found to assess the circulation and for a procedure to improve circulation to the extremities.  The pain in the toe is keeping him up at night.  He is on antibiotics he started 2 days ago.  He denies any constitutional signs of infection.   Objective: Nonpalpable pedal pulse is noted left. The swelling is decreased left foot. The head of the proximal phalanx is visible at the second toe and appears to be necrotic and infected.  The surrounding skin is also necrotic appearing only at the distal portion of the toe. Minimal redness is along the medial border of the incision site. Clear fluid is also present. No malodor is present, no pus, no streaking is seen. The left heel ulcer appears to be healing at and extremely slow rate but it is improving slightly. Main concern today is the circulation as well as the nonhealing nature of the amputation site left.   Assessment: Status post partial amputation left second toe and heel ulcer left-- pain in toe due to osteomyelitis of proximal phalanx   Plan: Debrided the necrotic bone that is exposed and dressed the toe in a dry sterile dressing.  He will stay on the antibiotics.  Recommended if the pain still continues he is to call Dr. Gwenlyn Found or go to the ER as he may need intervention prior to his scheduled procedure.  Once his circulation is improved we will plan to amputatie the remainder of the proximal phalanx that is infected.  I will see him back once the circulation has been assessed. If he notices any increased redness, swelling, signs of infection or pain he will call me immediately or go to the ED.

## 2014-03-09 ENCOUNTER — Encounter (HOSPITAL_COMMUNITY): Payer: Self-pay | Admitting: Pharmacy Technician

## 2014-03-11 ENCOUNTER — Other Ambulatory Visit (HOSPITAL_BASED_OUTPATIENT_CLINIC_OR_DEPARTMENT_OTHER): Payer: Medicare Other

## 2014-03-11 DIAGNOSIS — C9000 Multiple myeloma not having achieved remission: Secondary | ICD-10-CM

## 2014-03-11 LAB — CBC WITH DIFFERENTIAL/PLATELET
BASO%: 0.2 % (ref 0.0–2.0)
Basophils Absolute: 0 10*3/uL (ref 0.0–0.1)
EOS%: 0.5 % (ref 0.0–7.0)
Eosinophils Absolute: 0 10*3/uL (ref 0.0–0.5)
HCT: 27 % — ABNORMAL LOW (ref 38.4–49.9)
HGB: 9 g/dL — ABNORMAL LOW (ref 13.0–17.1)
LYMPH#: 1.7 10*3/uL (ref 0.9–3.3)
LYMPH%: 25 % (ref 14.0–49.0)
MCH: 28.8 pg (ref 27.2–33.4)
MCHC: 33.3 g/dL (ref 32.0–36.0)
MCV: 86.5 fL (ref 79.3–98.0)
MONO#: 0.6 10*3/uL (ref 0.1–0.9)
MONO%: 9 % (ref 0.0–14.0)
NEUT#: 4.3 10*3/uL (ref 1.5–6.5)
NEUT%: 65.3 % (ref 39.0–75.0)
Platelets: 215 10*3/uL (ref 140–400)
RBC: 3.12 10*6/uL — ABNORMAL LOW (ref 4.20–5.82)
RDW: 15.8 % — ABNORMAL HIGH (ref 11.0–14.6)
WBC: 6.6 10*3/uL (ref 4.0–10.3)

## 2014-03-11 LAB — COMPREHENSIVE METABOLIC PANEL (CC13)
ALBUMIN: 3.7 g/dL (ref 3.5–5.0)
ALT: 12 U/L (ref 0–55)
ANION GAP: 6 meq/L (ref 3–11)
AST: 21 U/L (ref 5–34)
Alkaline Phosphatase: 52 U/L (ref 40–150)
BILIRUBIN TOTAL: 0.57 mg/dL (ref 0.20–1.20)
BUN: 20 mg/dL (ref 7.0–26.0)
CALCIUM: 9.6 mg/dL (ref 8.4–10.4)
CHLORIDE: 94 meq/L — AB (ref 98–109)
CO2: 22 meq/L (ref 22–29)
Creatinine: 1.2 mg/dL (ref 0.7–1.3)
GLUCOSE: 132 mg/dL (ref 70–140)
Potassium: 4.9 mEq/L (ref 3.5–5.1)
SODIUM: 122 meq/L — AB (ref 136–145)
TOTAL PROTEIN: 7.3 g/dL (ref 6.4–8.3)

## 2014-03-11 LAB — PROTEIN / CREATININE RATIO, URINE
Creatinine, Urine: 21.9 mg/dL
PROTEIN CREATININE RATIO: 0.14 (ref <0.15)
TOTAL PROTEIN, URINE: 3 mg/dL

## 2014-03-17 LAB — KAPPA/LAMBDA LIGHT CHAINS
Kappa free light chain: 71 mg/dL — ABNORMAL HIGH (ref 0.33–1.94)
Kappa:Lambda Ratio: 24.65 — ABNORMAL HIGH (ref 0.26–1.65)
Lambda Free Lght Chn: 2.88 mg/dL — ABNORMAL HIGH (ref 0.57–2.63)

## 2014-03-22 ENCOUNTER — Inpatient Hospital Stay (HOSPITAL_COMMUNITY)
Admission: RE | Admit: 2014-03-22 | Discharge: 2014-03-29 | DRG: 252 | Disposition: A | Discharge status: Home Health | Payer: Medicare Other | Source: Ambulatory Visit | Attending: Vascular Surgery | Admitting: Vascular Surgery

## 2014-03-22 ENCOUNTER — Encounter (HOSPITAL_COMMUNITY): Payer: Self-pay | Admitting: *Deleted

## 2014-03-22 DIAGNOSIS — I5022 Chronic systolic (congestive) heart failure: Secondary | ICD-10-CM | POA: Diagnosis present

## 2014-03-22 DIAGNOSIS — G609 Hereditary and idiopathic neuropathy, unspecified: Secondary | ICD-10-CM | POA: Diagnosis present

## 2014-03-22 DIAGNOSIS — Z7982 Long term (current) use of aspirin: Secondary | ICD-10-CM

## 2014-03-22 DIAGNOSIS — I509 Heart failure, unspecified: Secondary | ICD-10-CM | POA: Diagnosis present

## 2014-03-22 DIAGNOSIS — M109 Gout, unspecified: Secondary | ICD-10-CM | POA: Diagnosis present

## 2014-03-22 DIAGNOSIS — D649 Anemia, unspecified: Secondary | ICD-10-CM | POA: Diagnosis present

## 2014-03-22 DIAGNOSIS — S98139A Complete traumatic amputation of one unspecified lesser toe, initial encounter: Secondary | ICD-10-CM

## 2014-03-22 DIAGNOSIS — I998 Other disorder of circulatory system: Secondary | ICD-10-CM | POA: Diagnosis present

## 2014-03-22 DIAGNOSIS — L98499 Non-pressure chronic ulcer of skin of other sites with unspecified severity: Principal | ICD-10-CM | POA: Diagnosis present

## 2014-03-22 DIAGNOSIS — E7800 Pure hypercholesterolemia, unspecified: Secondary | ICD-10-CM | POA: Diagnosis present

## 2014-03-22 DIAGNOSIS — I70209 Unspecified atherosclerosis of native arteries of extremities, unspecified extremity: Secondary | ICD-10-CM

## 2014-03-22 DIAGNOSIS — E871 Hypo-osmolality and hyponatremia: Secondary | ICD-10-CM | POA: Diagnosis present

## 2014-03-22 DIAGNOSIS — L89609 Pressure ulcer of unspecified heel, unspecified stage: Secondary | ICD-10-CM | POA: Diagnosis present

## 2014-03-22 DIAGNOSIS — C9000 Multiple myeloma not having achieved remission: Secondary | ICD-10-CM | POA: Diagnosis present

## 2014-03-22 DIAGNOSIS — I739 Peripheral vascular disease, unspecified: Principal | ICD-10-CM | POA: Diagnosis present

## 2014-03-22 DIAGNOSIS — I70229 Atherosclerosis of native arteries of extremities with rest pain, unspecified extremity: Secondary | ICD-10-CM | POA: Diagnosis present

## 2014-03-22 DIAGNOSIS — Z87891 Personal history of nicotine dependence: Secondary | ICD-10-CM

## 2014-03-22 DIAGNOSIS — L8993 Pressure ulcer of unspecified site, stage 3: Secondary | ICD-10-CM | POA: Diagnosis present

## 2014-03-22 DIAGNOSIS — I2510 Atherosclerotic heart disease of native coronary artery without angina pectoris: Secondary | ICD-10-CM

## 2014-03-22 DIAGNOSIS — I252 Old myocardial infarction: Secondary | ICD-10-CM

## 2014-03-22 DIAGNOSIS — E785 Hyperlipidemia, unspecified: Secondary | ICD-10-CM | POA: Diagnosis present

## 2014-03-22 DIAGNOSIS — I10 Essential (primary) hypertension: Secondary | ICD-10-CM | POA: Diagnosis present

## 2014-03-22 DIAGNOSIS — I214 Non-ST elevation (NSTEMI) myocardial infarction: Secondary | ICD-10-CM

## 2014-03-22 DIAGNOSIS — S98119A Complete traumatic amputation of unspecified great toe, initial encounter: Secondary | ICD-10-CM

## 2014-03-22 DIAGNOSIS — Z8249 Family history of ischemic heart disease and other diseases of the circulatory system: Secondary | ICD-10-CM

## 2014-03-22 DIAGNOSIS — L97509 Non-pressure chronic ulcer of other part of unspecified foot with unspecified severity: Secondary | ICD-10-CM | POA: Diagnosis present

## 2014-03-22 DIAGNOSIS — K5900 Constipation, unspecified: Secondary | ICD-10-CM | POA: Diagnosis not present

## 2014-03-22 DIAGNOSIS — I2589 Other forms of chronic ischemic heart disease: Secondary | ICD-10-CM | POA: Diagnosis present

## 2014-03-22 DIAGNOSIS — Z951 Presence of aortocoronary bypass graft: Secondary | ICD-10-CM

## 2014-03-22 DIAGNOSIS — R339 Retention of urine, unspecified: Secondary | ICD-10-CM | POA: Diagnosis present

## 2014-03-22 DIAGNOSIS — R112 Nausea with vomiting, unspecified: Secondary | ICD-10-CM | POA: Diagnosis not present

## 2014-03-22 MED ORDER — POLYETHYLENE GLYCOL 3350 17 G PO PACK
17.0000 g | PACK | Freq: Every day | ORAL | Status: DC | PRN
  Filled 2014-03-22: qty 1

## 2014-03-22 MED ORDER — CARVEDILOL 6.25 MG PO TABS
6.2500 mg | ORAL_TABLET | Freq: Two times a day (BID) | ORAL | Status: DC
  Administered 2014-03-22 – 2014-03-29 (×11): 6.25 mg via ORAL
  Filled 2014-03-22 (×19): qty 1

## 2014-03-22 MED ORDER — ASPIRIN EC 81 MG PO TBEC
81.0000 mg | DELAYED_RELEASE_TABLET | ORAL | Status: DC
  Filled 2014-03-22: qty 1

## 2014-03-22 MED ORDER — HYDROMORPHONE HCL 2 MG PO TABS
6.0000 mg | ORAL_TABLET | Freq: Four times a day (QID) | ORAL | Status: DC | PRN
  Administered 2014-03-22 – 2014-03-24 (×5): 6 mg via ORAL
  Filled 2014-03-22 (×5): qty 3

## 2014-03-22 MED ORDER — DULOXETINE HCL 60 MG PO CPEP
120.0000 mg | ORAL_CAPSULE | Freq: Every evening | ORAL | Status: DC
  Administered 2014-03-22: 60 mg via ORAL
  Filled 2014-03-22: qty 2

## 2014-03-22 MED ORDER — SODIUM CHLORIDE 0.9 % IV SOLN
INTRAVENOUS | Status: DC
  Administered 2014-03-22: 75 mL/h via INTRAVENOUS
  Administered 2014-03-23: 07:00:00 via INTRAVENOUS

## 2014-03-22 MED ORDER — TAMSULOSIN HCL 0.4 MG PO CAPS
0.4000 mg | ORAL_CAPSULE | Freq: Every day | ORAL | Status: DC
  Administered 2014-03-22 – 2014-03-23 (×2): 0.4 mg via ORAL
  Filled 2014-03-22 (×6): qty 1

## 2014-03-22 MED ORDER — TOBRAMYCIN 0.3 % OP SOLN
1.0000 [drp] | Freq: Every day | OPHTHALMIC | Status: DC | PRN
  Filled 2014-03-22: qty 5

## 2014-03-22 MED ORDER — DULOXETINE HCL 60 MG PO CPEP
60.0000 mg | ORAL_CAPSULE | Freq: Every evening | ORAL | Status: DC
  Administered 2014-03-23 – 2014-03-28 (×5): 60 mg via ORAL
  Filled 2014-03-22 (×9): qty 1

## 2014-03-22 MED ORDER — LISINOPRIL 20 MG PO TABS
20.0000 mg | ORAL_TABLET | Freq: Every day | ORAL | Status: DC
  Administered 2014-03-22 – 2014-03-29 (×5): 20 mg via ORAL
  Filled 2014-03-22 (×8): qty 1

## 2014-03-22 MED ORDER — NITROGLYCERIN 0.4 MG SL SUBL: 0.4000 mg | SUBLINGUAL_TABLET | SUBLINGUAL | Status: DC | PRN

## 2014-03-22 MED ORDER — ATORVASTATIN CALCIUM 10 MG PO TABS
10.0000 mg | ORAL_TABLET | Freq: Every evening | ORAL | Status: DC
  Administered 2014-03-22 – 2014-03-28 (×6): 10 mg via ORAL
  Filled 2014-03-22 (×10): qty 1

## 2014-03-22 MED ORDER — ALLOPURINOL 150 MG HALF TABLET
150.0000 mg | ORAL_TABLET | Freq: Every day | ORAL | Status: DC
  Filled 2014-03-22 (×2): qty 1

## 2014-03-22 MED ORDER — ROPINIROLE HCL 1 MG PO TABS
3.0000 mg | ORAL_TABLET | Freq: Every day | ORAL | Status: DC
  Administered 2014-03-22 – 2014-03-28 (×5): 3 mg via ORAL
  Filled 2014-03-22 (×9): qty 3

## 2014-03-22 NOTE — Progress Notes (Signed)
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 for peripheral vascular evaluation because of left eye limiting claudication and poorly healing ulcers on his feet. His podiatrist is Dr. Anselm Pancoast and his oncologist Dr. Gunnar Bulla Magrinet. He has a history of hypertension and hyperlipidemia as well as family history of heart disease. He had coronary artery bypass grafting x4 by Dr. Roxy Manns in 2002. A Myoview stress test on January of last year showed scar without ischemia and a 2-D echo revealed an ejection fraction of 35-40%. He denies chest pain or shortness of breath. He also has multiple myeloma which has been quiet and since February 2013 when he received his last chemotherapy treatment. He complains of Left lower extremity lifestyle limiting claudication. He has a slowly healing ulcer on his left heel as well.he has had amputation of his left great toe and a portion of his left second toe.  Current Outpatient Prescriptions   Medication  Sig  Dispense  Refill   .  allopurinol (ZYLOPRIM) 300 MG tablet  Take 0.5 tablets (150 mg total) by mouth daily.  90 tablet  0   .  aspirin EC 81 MG tablet  Take 81 mg by mouth 3 (three) times a week.     Marland Kitchen  atorvastatin (LIPITOR) 10 MG tablet  Take 1 tablet (10 mg total) by mouth daily.  90 tablet  3   .  B Complex-Biotin-FA (B COMPLETE PO)  Take 0.5 tablets by mouth 2 (two) times daily.     .  carvedilol (COREG) 6.25 MG tablet  TAKE 1 TABLET TWICE A DAY  180 tablet  1   .  Coenzyme Q10 (CO Q-10 PO)  Take 1 tablet by mouth every morning.     .  DULoxetine (CYMBALTA) 60 MG capsule  Take 2 capsules (120 mg total) by mouth every evening.  180 capsule  1   .  HYDROmorphone (DILAUDID) 4 MG tablet  Take 1.5 tablets (6 mg total) by  mouth every 6 (six) hours as needed.  120 tablet  0   .  lisinopril (PRINIVIL,ZESTRIL) 20 MG tablet  TAKE 1 TABLET DAILY  90 tablet  2   .  Multiple Vitamin (MULTIVITAMIN) capsule  Take 1 capsule by mouth every morning.     .  nitroGLYCERIN (NITROSTAT) 0.4 MG SL tablet  Place 0.4 mg under the tongue every 5 (five) minutes as needed for chest pain.     .  polyethylene glycol (MIRALAX / GLYCOLAX) packet  Take 17 g by mouth daily as needed.  14 each  0   .  rOPINIRole (REQUIP) 0.5 MG tablet  0.15 mg. Take 0.25 mg 1-3 hours before bed for 2 days, then may increase to 0.5 mq before bed for 5 days. If it still needs to be increased, you may increase by 0.5 mg per week until you reach 3 mg.     .  tamsulosin (FLOMAX) 0.4 MG CAPS capsule  Take 0.4 mg by mouth at bedtime.     Marland Kitchen  tobramycin (TOBREX) 0.3 % ophthalmic solution  Place 1 drop into both eyes as needed.     .  vitamin C (ASCORBIC ACID) 500 MG tablet  Take 500 mg by mouth every morning.      No current  facility-administered medications for this visit.    Allergies   Allergen  Reactions   .  Morphine And Related  Other (See Comments)     Agitated with morphine drip    History    Social History   .  Marital Status:  Married     Spouse Name:  N/A     Number of Children:  N/A   .  Years of Education:  N/A    Occupational History   .  Not on file.    Social History Main Topics   .  Smoking status:  Former Smoker -- 1.00 packs/day for 20 years     Types:  Cigarettes     Quit date:  11/20/1984   .  Smokeless tobacco:  Never Used   .  Alcohol Use:  No   .  Drug Use:  No   .  Sexual Activity:  Not Currently    Other Topics  Concern   .  Not on file    Social History Narrative    Lives at home with wife, has children but not with current wife. Still active, golfing and walking in the yard before the issue with the wound, now using a walker.   Review of Systems:  General: negative for chills, fever, night sweats or weight changes.    Cardiovascular: negative for chest pain, dyspnea on exertion, edema, orthopnea, palpitations, paroxysmal nocturnal dyspnea or shortness of breath  Dermatological: negative for rash  Respiratory: negative for cough or wheezing  Urologic: negative for hematuria  Abdominal: negative for nausea, vomiting, diarrhea, bright red blood per rectum, melena, or hematemesis  Neurologic: negative for visual changes, syncope, or dizziness  All other systems reviewed and are otherwise negative except as noted above.  Blood pressure 137/59, pulse 71, height 5' 10" (1.778 m), weight 191 lb (86.637 kg).  General appearance: alert and no distress  Neck: no adenopathy, no carotid bruit, no JVD, supple, symmetrical, trachea midline and thyroid not enlarged, symmetric, no tenderness/mass/nodules  Lungs: clear to auscultation bilaterally  Heart: regular rate and rhythm, S1, S2 normal, no murmur, click, rub or gallop  Extremities: extremities normal, atraumatic, no cyanosis or edema and plus femorals with a left femoral bruit. Diminished pedal pulses.  EKG not performed today  ASSESSMENT AND PLAN:  Critical lower limb ischemia  Mr. Timothy [name] was referred to Dr. Gwenlyn Found by [name] for severe lifestyle and claudication and critical limb ischemia. He has a heel ulcer on his left foot as had his left first toe implicated in the past. He has severe lifestyle limiting claudication on that side. Dopplers performed 01/26/14 revealed a high-frequency signal at the origin of the left SFA and occluded distal right SFA. He did have a right heel ulcer which is slowly healing. 80 This occurred as a result of a bed sore during rehabilitation from a broken hip in a fall last year. He does have multiple myeloma which places him at increased risk of recurrent chest nephropathy. Also has moderate LV dysfunction with an ejection fraction in the 40% range. I'm going to admit him to the floor his angiogram for hydration and performed lower  cineangiography and potential endovascular therapy.   Sanda Klein, MD, [facility] [phone] office [phone] pager

## 2014-03-23 ENCOUNTER — Encounter (HOSPITAL_COMMUNITY)
Admission: RE | Disposition: A | Discharge status: Home / Self Care | Payer: Self-pay | Source: Ambulatory Visit | Attending: Vascular Surgery

## 2014-03-23 DIAGNOSIS — I739 Peripheral vascular disease, unspecified: Secondary | ICD-10-CM

## 2014-03-23 DIAGNOSIS — I70209 Unspecified atherosclerosis of native arteries of extremities, unspecified extremity: Secondary | ICD-10-CM

## 2014-03-23 DIAGNOSIS — I2510 Atherosclerotic heart disease of native coronary artery without angina pectoris: Secondary | ICD-10-CM

## 2014-03-23 DIAGNOSIS — L98499 Non-pressure chronic ulcer of skin of other sites with unspecified severity: Secondary | ICD-10-CM

## 2014-03-23 HISTORY — PX: LOWER EXTREMITY ANGIOGRAM: SHX5508

## 2014-03-23 LAB — COMPREHENSIVE METABOLIC PANEL
ALBUMIN: 3.3 g/dL — AB (ref 3.5–5.2)
ALT: 11 U/L (ref 0–53)
AST: 21 U/L (ref 0–37)
Alkaline Phosphatase: 42 U/L (ref 39–117)
BUN: 18 mg/dL (ref 6–23)
CALCIUM: 8.7 mg/dL (ref 8.4–10.5)
CO2: 22 meq/L (ref 19–32)
CREATININE: 0.97 mg/dL (ref 0.50–1.35)
Chloride: 94 mEq/L — ABNORMAL LOW (ref 96–112)
GFR calc Af Amer: 86 mL/min — ABNORMAL LOW (ref >90)
GFR calc non Af Amer: 74 mL/min — ABNORMAL LOW (ref >90)
Glucose, Bld: 92 mg/dL (ref 70–99)
Potassium: 4.5 mEq/L (ref 3.7–5.3)
SODIUM: 127 meq/L — AB (ref 137–147)
TOTAL PROTEIN: 6.6 g/dL (ref 6.0–8.3)
Total Bilirubin: 0.5 mg/dL (ref 0.3–1.2)

## 2014-03-23 LAB — CBC
HCT: 26.3 % — ABNORMAL LOW (ref 39.0–52.0)
Hemoglobin: 8.7 g/dL — ABNORMAL LOW (ref 13.0–17.0)
MCH: 29 pg (ref 26.0–34.0)
MCHC: 33.1 g/dL (ref 30.0–36.0)
MCV: 87.7 fL (ref 78.0–100.0)
PLATELETS: 193 10*3/uL (ref 150–400)
RBC: 3 MIL/uL — AB (ref 4.22–5.81)
RDW: 16 % — AB (ref 11.5–15.5)
WBC: 5.7 10*3/uL (ref 4.0–10.5)

## 2014-03-23 LAB — PROTIME-INR
INR: 1.15 (ref 0.00–1.49)
PROTHROMBIN TIME: 14.5 s (ref 11.6–15.2)

## 2014-03-23 LAB — SURGICAL PCR SCREEN
MRSA, PCR: NEGATIVE
Staphylococcus aureus: NEGATIVE

## 2014-03-23 SURGERY — ANGIOGRAM, LOWER EXTREMITY
Anesthesia: LOCAL

## 2014-03-23 MED ORDER — CEFAZOLIN SODIUM 1-5 GM-% IV SOLN
1.0000 g | INTRAVENOUS | Status: DC
  Filled 2014-03-23: qty 50

## 2014-03-23 MED ORDER — ASPIRIN EC 325 MG PO TBEC
325.0000 mg | DELAYED_RELEASE_TABLET | Freq: Every day | ORAL | Status: DC
  Administered 2014-03-23: 325 mg via ORAL
  Filled 2014-03-23 (×2): qty 1

## 2014-03-23 MED ORDER — ZOLPIDEM TARTRATE 5 MG PO TABS
5.0000 mg | ORAL_TABLET | Freq: Once | ORAL | Status: DC
  Filled 2014-03-23: qty 1

## 2014-03-23 MED ORDER — HEPARIN (PORCINE) IN NACL 2-0.9 UNIT/ML-% IJ SOLN
INTRAMUSCULAR | Status: AC
  Filled 2014-03-23: qty 1000

## 2014-03-23 MED ORDER — CEFAZOLIN SODIUM-DEXTROSE 2-3 GM-% IV SOLR
2.0000 g | INTRAVENOUS | Status: AC
  Administered 2014-03-24: 2 g via INTRAVENOUS
  Filled 2014-03-23 (×2): qty 50

## 2014-03-23 MED ORDER — MIDAZOLAM HCL 2 MG/2ML IJ SOLN
INTRAMUSCULAR | Status: AC
  Filled 2014-03-23: qty 2

## 2014-03-23 MED ORDER — LIDOCAINE HCL (PF) 1 % IJ SOLN
INTRAMUSCULAR | Status: AC
  Filled 2014-03-23: qty 30

## 2014-03-23 MED ORDER — SODIUM CHLORIDE 0.9 % IV SOLN: INTRAVENOUS | Status: AC

## 2014-03-23 MED ORDER — FENTANYL CITRATE 0.05 MG/ML IJ SOLN
INTRAMUSCULAR | Status: AC
  Filled 2014-03-23: qty 2

## 2014-03-23 MED ORDER — MORPHINE SULFATE 2 MG/ML IJ SOLN: 1.0000 mg | INTRAMUSCULAR | Status: DC | PRN

## 2014-03-23 NOTE — Care Management Note (Signed)
    Page 1 of 1   03/28/2014     3:10:51 PM CARE MANAGEMENT NOTE 03/28/2014  Patient:  Timothy [name], Timothy [name]   Account Number:  [id number]  Date Initiated:  03/23/2014  Documentation initiated by:  AMERSON,JULIE  Subjective/Objective Assessment:   Pt adm on 03/22/13 with ischemic Lt foot.  PTA, pt independent, lives with spouse.     Action/Plan:   Will follow for dc needs as pt progresses.   Anticipated DC Date:  03/27/2014   Anticipated DC Plan:  [place]  CM consult      [facility] Choice  HOME HEALTH   Choice offered to / List presented to:  C-1 Patient        [place] arranged  HH-2 PT      [place]   Status of service:  In process, will continue to follow Medicare Important Message given?  YES (If response is "NO", the following Medicare IM given date fields will be blank) Date Medicare IM given:  03/23/2014 Date Additional Medicare IM given:    Discharge Disposition:  [place]  Per UR Regulation:  Reviewed for med. necessity/level of care/duration of stay  If discussed at [place] of Stay Meetings, dates discussed:    Comments:  03/28/14 10:44 CM met with pt earlier and gave a list of [place] agencies to choose an agency to render his ordered HHPT. He deferred to his wife who at 10:44 came into room and stated they had [place] and hope to have them again with Vincente Liberty as the physical therapist.  CM stated the request would be passed on.  Pt has walker at home and does not need any other DME (confirmed with wife).  CM called referral to Lennette B[facility]ari, with request for [place]. Address and contact information verified with pt.  No other CM needs were communicated.  Mariane Masters, BSN, CM [phone].

## 2014-03-23 NOTE — Interval H&P Note (Signed)
History and Physical Interval Note:  03/23/2014 7:44 AM  Timothy [name]  has presented today for surgery, with the diagnosis of Critical Limb ischemia   The various methods of treatment have been discussed with the patient and family. After consideration of risks, benefits and other options for treatment, the patient has consented to  Procedure(s): LOWER EXTREMITY ANGIOGRAM (N/A) as a surgical intervention .  The patient's history has been reviewed, patient examined, no change in status, stable for surgery.  I have reviewed the patient's chart and labs.  Questions were answered to the patient's satisfaction.     Lorretta Harp

## 2014-03-23 NOTE — H&P (View-Only) (Signed)
02/20/2014  Timothy [name]  09/21/1928  [id number]  Primary Physician MAGRINAT,GUSTAV C, MD  Primary Cardiologist: Jonathan J. Berry MD FACP,FACC,FAHA, FSCAI  HPI: Timothy [name] is a 80-year-old married Caucasian male father of 2, grandfather for grandchildren referred by Dr. Peter Jordan for peripheral vascular evaluation because of left eye limiting claudication and poorly healing ulcers on his feet. His podiatrist is Dr. Catherine Edgerton and his oncologist Dr. Gus Magrinet. He has a history of hypertension and hyperlipidemia as well as family history of heart disease. He had coronary artery bypass grafting x4 by Dr. Owen in 2002. A Myoview stress test on January of last year showed scar without ischemia and a 2-D echo revealed an ejection fraction of 35-40%. He denies chest pain or shortness of breath. He also has multiple myeloma which has been quiet and since February 2013 when he received his last chemotherapy treatment. He complains of Left lower extremity lifestyle limiting claudication. He has a slowly healing ulcer on his left heel as well.he has had amputation of his left great toe and a portion of his left second toe.  Current Outpatient Prescriptions   Medication  Sig  Dispense  Refill   .  allopurinol (ZYLOPRIM) 300 MG tablet  Take 0.5 tablets (150 mg total) by mouth daily.  90 tablet  0   .  aspirin EC 81 MG tablet  Take 81 mg by mouth 3 (three) times a week.     .  atorvastatin (LIPITOR) 10 MG tablet  Take 1 tablet (10 mg total) by mouth daily.  90 tablet  3   .  B Complex-Biotin-FA (B COMPLETE PO)  Take 0.5 tablets by mouth 2 (two) times daily.     .  carvedilol (COREG) 6.25 MG tablet  TAKE 1 TABLET TWICE A DAY  180 tablet  1   .  Coenzyme Q10 (CO Q-10 PO)  Take 1 tablet by mouth every morning.     .  DULoxetine (CYMBALTA) 60 MG capsule  Take 2 capsules (120 mg total) by mouth every evening.  180 capsule  1   .  HYDROmorphone (DILAUDID) 4 MG tablet  Take 1.5 tablets (6 mg total) by  mouth every 6 (six) hours as needed.  120 tablet  0   .  lisinopril (PRINIVIL,ZESTRIL) 20 MG tablet  TAKE 1 TABLET DAILY  90 tablet  2   .  Multiple Vitamin (MULTIVITAMIN) capsule  Take 1 capsule by mouth every morning.     .  nitroGLYCERIN (NITROSTAT) 0.4 MG SL tablet  Place 0.4 mg under the tongue every 5 (five) minutes as needed for chest pain.     .  polyethylene glycol (MIRALAX / GLYCOLAX) packet  Take 17 g by mouth daily as needed.  14 each  0   .  rOPINIRole (REQUIP) 0.5 MG tablet  0.15 mg. Take 0.25 mg 1-3 hours before bed for 2 days, then may increase to 0.5 mq before bed for 5 days. If it still needs to be increased, you may increase by 0.5 mg per week until you reach 3 mg.     .  tamsulosin (FLOMAX) 0.4 MG CAPS capsule  Take 0.4 mg by mouth at bedtime.     .  tobramycin (TOBREX) 0.3 % ophthalmic solution  Place 1 drop into both eyes as needed.     .  vitamin C (ASCORBIC ACID) 500 MG tablet  Take 500 mg by mouth every morning.      No current   facility-administered medications for this visit.    Allergies   Allergen  Reactions   .  Morphine And Related  Other (See Comments)     Agitated with morphine drip    History    Social History   .  Marital Status:  Married     Spouse Name:  N/A     Number of Children:  N/A   .  Years of Education:  N/A    Occupational History   .  Not on file.    Social History Main Topics   .  Smoking status:  Former Smoker -- 1.00 packs/day for 20 years     Types:  Cigarettes     Quit date:  11/20/1984   .  Smokeless tobacco:  Never Used   .  Alcohol Use:  No   .  Drug Use:  No   .  Sexual Activity:  Not Currently    Other Topics  Concern   .  Not on file    Social History Narrative    Lives at home with wife, has children but not with current wife. Still active, golfing and walking in the yard before the issue with the wound, now using a walker.   Review of Systems:  General: negative for chills, fever, night sweats or weight changes.    Cardiovascular: negative for chest pain, dyspnea on exertion, edema, orthopnea, palpitations, paroxysmal nocturnal dyspnea or shortness of breath  Dermatological: negative for rash  Respiratory: negative for cough or wheezing  Urologic: negative for hematuria  Abdominal: negative for nausea, vomiting, diarrhea, bright red blood per rectum, melena, or hematemesis  Neurologic: negative for visual changes, syncope, or dizziness  All other systems reviewed and are otherwise negative except as noted above.  Blood pressure 137/59, pulse 71, height 5' 10" (1.778 m), weight 191 lb (86.637 kg).  General appearance: alert and no distress  Neck: no adenopathy, no carotid bruit, no JVD, supple, symmetrical, trachea midline and thyroid not enlarged, symmetric, no tenderness/mass/nodules  Lungs: clear to auscultation bilaterally  Heart: regular rate and rhythm, S1, S2 normal, no murmur, click, rub or gallop  Extremities: extremities normal, atraumatic, no cyanosis or edema and plus femorals with a left femoral bruit. Diminished pedal pulses.  EKG not performed today  ASSESSMENT AND PLAN:  Critical lower limb ischemia  Timothy [name] was referred to Dr. Gwenlyn Found by [name] for severe lifestyle and claudication and critical limb ischemia. He has a heel ulcer on his left foot as had his left first toe implicated in the past. He has severe lifestyle limiting claudication on that side. Dopplers performed 01/26/14 revealed a high-frequency signal at the origin of the left SFA and occluded distal right SFA. He did have a right heel ulcer which is slowly healing. This occurred as a result of a bed sore during rehabilitation from a broken hip in a fall last year. He does have multiple myeloma which places him at increased risk of recurrent chest nephropathy. Also has moderate LV dysfunction with an ejection fraction in the 40% range. I'm going to admit him to the floor his angiogram for hydration and performed lower  cineangiography and potential endovascular therapy.   Sanda Klein, MD, [facility] [phone] office [phone] pager

## 2014-03-23 NOTE — Progress Notes (Addendum)
       Patient Name: Timothy [name] Date of Encounter: 03/23/2014    SUBJECTIVE:He denies dyspnea and chest pain. Has h/o CAD. Had angio this AM and needs surgery to improve flow into left leg.  TELEMETRY:  NSR Filed Vitals:   03/23/14 1030 03/23/14 1049 03/23/14 1100 03/23/14 1130  BP: 138/55 137/51 138/50 120/92  Pulse: 58 54 55 56  Temp:      TempSrc:      Resp:      Height:      Weight:      SpO2:        Intake/Output Summary (Last 24 hours) at 03/23/14 1242 Last data filed at 03/23/14 1143  Gross per 24 hour  Intake 1468.75 ml  Output   1650 ml  Net -181.25 ml   LABS: Basic Metabolic Panel:  Recent Labs  03/23/14 0550  NA 127*  K 4.5  CL 94*  CO2 22  GLUCOSE 92  BUN 18  CREATININE 0.97  CALCIUM 8.7   CBC:  Recent Labs  03/23/14 0550  WBC 5.7  HGB 8.7*  HCT 26.3*  MCV 87.7  PLT 193    Radiology/Studies:  No new except angiogram  Physical Exam: Blood pressure 120/92, pulse 56, temperature 97.3 F (36.3 C), temperature source Oral, resp. rate 16, height 5\' 10"  (1.778 m), weight 187 lb 9.8 oz (85.1 kg), SpO2 99.00%. Weight change:   Wt Readings from Last 3 Encounters:  03/22/14 187 lb 9.8 oz (85.1 kg)  03/22/14 187 lb 9.8 oz (85.1 kg)  03/06/14 170 lb (77.111 kg)    Chest is clear Cardiac exam is unremarkable. Right groin cath site okay.  ASSESSMENT:  1. PVD with critical disease in Right lower extremity. 2. CAD stable with no angina or heart failure symptoms.  Plan:  Plan surgical revasc tomorrow as described by Dr. Scot Dock. In absence of symptoms, I don't think ischemic eval is needed. Last ~ 1 year ago(scar but no ischemia by Myoview) and CABG 2002.  Signed, [place] 03/23/2014, 12:42 PM

## 2014-03-23 NOTE — Consult Note (Addendum)
Vascular and Vein Specialist of [facility]  Patient name: TYDEN KANN MRN: [id number] DOB: 1928/08/20 Sex: male  REASON FOR CONSULT: Multilevel arterial occlusive disease with limb threatening ischemia of the left lower extremity. Consult is from Dr. Quay Burow.  HPI: FINDLEY VI is a 80 y.o. male who had presented with a nonhealing wound of his left heel. He was brought in for arteriography by Dr. Quay Burow. This demonstrated a bulky eccentric plaque in the left common femoral artery and vascular surgery was consult for consideration for endarterectomy and patch angioplasty.  The patient does give a history of left thigh claudication which is brought on by ambulation and relieved with rest. He denies any history of rest pain. He fell off of his lawnmower and fractured his right femur in October. He ultimately went to rehabilitation and developed a heel decubitus. This wound has made progress but has not completely healed. In addition, he has undergone previous left great toe amputation and more recently partial amputation of the left second toe. The second toe amputation site is not healed.  His risk factors for peripheral vascular disease include hypertension and hypercholesterolemia. He denies any history of diabetes, history of premature cardiovascular disease, or tobacco use.  He has undergone previous CABG and states that the vein was taken from the right leg for this procedure back in 2002.  Past Medical History  Diagnosis Date  . ASCVD (arteriosclerotic cardiovascular disease)   . Anemia     chronic mild anemia  . Gout   . Hypertension   . Lumbar disc disease     post laminectomy  . Chronic back pain   . History of echocardiogram 11/02/2010    EF range of 30 to 35% / There is hypokinsesis of the basal-mild inferolateral myocardium  . CHF (congestive heart failure)   . Heart murmur   . Ischemic cardiomyopathy   . Renal insufficiency   . Peripheral neuropathy     . Inferior myocardial infarction 1986  . Hyperlipidemia   . SOB (shortness of breath)   . Fatigue   . Peripheral neuropathy   . Coronary artery disease     CABG 2002 by Dr. Roxy Manns with LIMA to LAD, SVG to intermediate, SVG to LCX, & SVG to PL. Last nuclear in 2012 showing large inferior scar with EF of 39%.   . Blood transfusion     DEC 2012 - TWO UNITS  . Myeloma     Multiple myeloma, with recurrence of increasing problems  DR. Ivanhoe -ONCOLOGIST.  PT HAS BEEN OFF CHEMO SINCE HIS HOSPITALIZATION FEB 2013 FOR LEFT FOOT INFECTION  . Cancer   . History of shingles MARCH 2009    SHINGLE LESIONS WERE AROUND RIGHT EYE--PT HAS RESIDUAL ITCHING AROUND THE EYE.  . Osteomyelitis     s/p toe amputation  . Chronic systolic heart failure     EF of 30% per echo 02/2012  . Protein malnutrition   . Critical lower limb ischemia    FAMILY HISTORY: He denies any history of premature cardiovascular disease.  History reviewed. No pertinent family history.  SOCIAL HISTORY: History  Substance Use Topics  . Smoking status: Former Smoker -- 1.00 packs/day for 20 years    Types: Cigarettes    Quit date: 11/20/1984  . Smokeless tobacco: Never Used  . Alcohol Use: No    Allergies  Allergen Reactions  . Morphine And Related Other (See Comments)    Agitated with morphine drip  Current Facility-Administered Medications  Medication Dose Route Frequency Provider Last Rate Last Dose  . 0.9 %  sodium chloride infusion   Intravenous Continuous Lorretta Harp, MD 75 mL/hr at 03/23/14 0900    . aspirin EC tablet 325 mg  325 mg Oral Daily Lorretta Harp, MD      . atorvastatin (LIPITOR) tablet 10 mg  10 mg Oral QPM Mihai Croitoru, MD   10 mg at 03/22/14 1814  . carvedilol (COREG) tablet 6.25 mg  6.25 mg Oral BID WC Mihai Croitoru, MD   6.25 mg at 03/23/14 6948  . DULoxetine (CYMBALTA) DR capsule 60 mg  60 mg Oral QPM Lorretta Harp, MD      . HYDROmorphone (DILAUDID) tablet 6 mg  6 mg Oral Q6H PRN  Sanda Klein, MD   6 mg at 03/23/14 0546  . lisinopril (PRINIVIL,ZESTRIL) tablet 20 mg  20 mg Oral Daily Mihai Croitoru, MD   20 mg at 03/22/14 1813  . morphine 2 MG/ML injection 1 mg  1 mg Intravenous Q1H PRN Lorretta Harp, MD      . nitroGLYCERIN (NITROSTAT) SL tablet 0.4 mg  0.4 mg Sublingual Q5 min PRN Mihai Croitoru, MD      . polyethylene glycol (MIRALAX / GLYCOLAX) packet 17 g  17 g Oral Daily PRN Mihai Croitoru, MD      . rOPINIRole (REQUIP) tablet 3 mg  3 mg Oral QHS Mihai Croitoru, MD   3 mg at 03/22/14 2125  . tamsulosin (FLOMAX) capsule 0.4 mg  0.4 mg Oral QHS Mihai Croitoru, MD   0.4 mg at 03/22/14 2125  . tobramycin (TOBREX) 0.3 % ophthalmic solution 1 drop  1 drop Both Eyes Daily PRN Mihai Croitoru, MD        REVIEW OF SYSTEMS: Valu.Nieves ] denotes positive finding; [  ] denotes negative finding CARDIOVASCULAR:  [ ]  chest pain   [ ]  chest pressure   [ ]  palpitations   [ ]  orthopnea   Valu.Nieves ] dyspnea on exertion   [ ]  claudication   [ ]  rest pain   [ ]  DVT  Valu.Nieves ] PE  PULMONARY:   [ ]  productive cough   [ ]  asthma   [ ]  wheezing NEUROLOGIC:   [ ]  weakness  [ ]  paresthesias  [ ]  aphasia  [ ]  amaurosis  [ ]  dizziness HEMATOLOGIC:   [ ]  bleeding problems   [ ]  clotting disorders MUSCULOSKELETAL:  [ ]  joint pain   [ ]  joint swelling [ ]  leg swelling GASTROINTESTINAL: [ ]   blood in stool  [ ]   hematemesis GENITOURINARY:  [ ]   dysuria  [ ]   hematuria PSYCHIATRIC:  [ ]  history of major depression INTEGUMENTARY:  [ ]  rashes  [ ]  ulcers CONSTITUTIONAL:  [ ]  fever   [ ]  chills  PHYSICAL EXAM: Filed Vitals:   03/23/14 0804 03/23/14 0937 03/23/14 0945 03/23/14 1000  BP:  111/48 144/54 134/67  Pulse: 58 56 70 60  Temp:  97.3 F (36.3 C)    TempSrc:  Oral    Resp:  16    Height:      Weight:      SpO2:  99%     Body mass index is 26.92 kg/(m^2). GENERAL: The patient is a well-nourished male, in no acute distress. The vital signs are documented above. CARDIOVASCULAR: There is a regular  rate and rhythm. I do not detect carotid bruits. He has palpable radial pulses. On  the left side, which is the symptomatic side, I cannot palpate a femoral pulse. I cannot palpate a left popliteal, dorsalis pedis, or posterior tibial pulse. He has a palpable right femoral pulse. I cannot palpate pulses in the right foot. The right foot is warm and well-perfused. PULMONARY: There is good air exchange bilaterally without wheezing or rales. ABDOMEN: Soft and non-tender with normal pitched bowel sounds.  MUSCULOSKELETAL: He has had previous left great toe amputation. He has had a partial amputation of the left second toe. This wound is open with some exposed bone. NEUROLOGIC: No focal weakness or paresthesias are detected. SKIN: The left second toe has an exposed wound with exposed bone and minimal cellulitis. PSYCHIATRIC: The patient has a normal affect.  DATA:  Lab Results  Component Value Date   WBC 5.7 03/23/2014   HGB 8.7* 03/23/2014   HCT 26.3* 03/23/2014   MCV 87.7 03/23/2014   PLT 193 03/23/2014   Lab Results  Component Value Date   NA 127* 03/23/2014   K 4.5 03/23/2014   CL 94* 03/23/2014   CO2 22 03/23/2014   Lab Results  Component Value Date   CREATININE 0.97 03/23/2014   Lab Results  Component Value Date   INR 1.15 03/23/2014   INR 1.10 03/01/2012   INR 1.09 01/11/2012   PROTIME 20.4* 07/02/2007   PROTIME 13.2 12/05/2006   PROTIME 14.4* 11/29/2006   DATA: I have reviewed his arteriogram which shows a widely patent infrarenal aorta. There is moderate disease of left common iliac artery with no focal stenosis. The external iliac artery is patent. There is a large bulky eccentric plaque in the left common femoral artery. Superficial femoral artery is patent with mild diffuse disease. There is severe tibial artery occlusive disease. The anterior tibial artery is occluded proximally. There is some reconstitution distally but the dorsalis pedis is occluded. The peroneal artery occludes distally. The  posterior tibial artery is occluded proximally with reconstitution of the posterior tibial artery distally. This does not runoff to the foot.  MEDICAL ISSUES:  PERIPHERAL VASCULAR DISEASE WITH NONHEALING WOUNDS OF THE LEFT FOOT: This patient has a nonhealing left heel wound and left second toe amputation site. He has multilevel arterial occlusive disease. I agree with Dr. Kennon Holter assessment that left common femoral endarterectomy is indicated with vein patch angioplasty. Although he does in addition have significant tibial artery occlusive disease, he has not had any good options for a distal by pass given his severe tibial artery occlusive disease. In addition he is 29 with multiple medical comorbidities. However I think improving the inflow by addressing the common femoral artery stenosis should significantly help flow to the left foot and improve his chances of healing the wounds on the left foot. He may need additional work on the left second toe by his podiatrist. I have discussed the indications for left common femoral artery endarterectomy with patch angioplasty. In addition I discussed the potential complications of surgery including but not limited to bleeding, wound healing problems, and infection. All of his questions were answered and he is agreeable to proceed.   Angelia Mould [facility] [place] [facility]: [phone]

## 2014-03-23 NOTE — CV Procedure (Signed)
Timothy [name] is a 80 y.o. male    [id number] LOCATION:  FACILITY: [facility]  PHYSICIAN: Quay Burow, M.D. 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 for peripheral vascular evaluation because of left lower extremity claudication and poorly healing ulcers on his feet. His podiatrist is Dr. Anselm Pancoast and his oncologist Dr. Gunnar Bulla Magrinet. He has a history of hypertension and hyperlipidemia as well as family history of heart disease. He had coronary artery bypass grafting x4 by Dr. Roxy Manns in 2002. A Myoview stress test on January of last year showed scar without ischemia and a 2-D echo revealed an ejection fraction of 35-40%. He denies chest pain or shortness of breath. He also has multiple myeloma which has been quiet and since February 2013 when he received his last chemotherapy treatment. He complains of Left lower extremity lifestyle limiting claudication. He has a slowly healing ulcer on his left heel as well.he has had amputation of his left great toe and a portion of his left second toe. He was admitted yesterday for hydration. He presents now for angiography and potential intervention for critical limb ischemia.    PROCEDURE DESCRIPTION:   The patient was brought to the second floor [facility] cath lab in the postabsorptive state. He was premedicated with Valium 5 mg by mouth, IV Versed and fentanyl. His right groinwas prepped and shaved in usual sterile fashion. Xylocaine 1% was used for local anesthesia. A 5 French sheath was inserted into the right common femoral artery using standard Seldinger technique.a 5 French pigtail catheter was placed in the distal thumb aorta. Abdominal aortography and bilateral iliac angiography was performed. Contralateral access obtained  with a 5 [place] crossover catheter and a 4 French angled catheter. Left lobectomy angiography was performed using bolus chase, digital subtraction, step table technique. Visipaque allergies for the entirety of the case (84 cc administered the patient). Retrograde aortic pressure was monitored during the case.  HEMODYNAMICS:    AO SYSTOLIC/AO DIASTOLIC: 793/90   Angiographic Data:   1: Distal abdominal aorta, bilateral iliac angiogram-the distal abdominal aorta was free of significant disease. The right iliac was previously diseased. The left common iliac at 30-40% calcific stenosis that appeared nonobstructive. The left common femoral artery had sequential high-grade calcified/exophytic plaques just above the takeoff the profunda femoris, below the femoral head.  2: Left lower extremity-there were 50-60% calcified plaques in the mid and distal left SFA. There is 0 vessel runoff to the foot. The profunda was patent down to the mid calf. The anterior tibial occluded in its proximal third it reconstituted in the distal third. The posterior tibial was occluded proximally it reconstituted in its mid third.  IMPRESSION:Dr. . Aram Candela has critical limb ischemia of both inflow and outflow disease. I do not think the most common femoral calcified stenoses are percutaneously addressable. I reviewed the case with Dr. Gae Gallop from [facility] who has agreed to evaluate the patient and perform left common femoral endarterectomy with patch angioplasty. Following this his outlook and be more thoroughly addressed.   Timothy [name]. MD, [facility] 03/23/2014 8:41 AM

## 2014-03-24 ENCOUNTER — Encounter (HOSPITAL_COMMUNITY): Payer: Self-pay | Admitting: Anesthesiology

## 2014-03-24 ENCOUNTER — Telehealth: Payer: Self-pay | Admitting: Vascular Surgery

## 2014-03-24 ENCOUNTER — Encounter (HOSPITAL_COMMUNITY)
Admission: RE | Disposition: A | Discharge status: Home / Self Care | Payer: Self-pay | Source: Ambulatory Visit | Attending: Vascular Surgery

## 2014-03-24 ENCOUNTER — Inpatient Hospital Stay (HOSPITAL_COMMUNITY): Payer: Medicare Other | Admitting: Anesthesiology

## 2014-03-24 ENCOUNTER — Encounter (HOSPITAL_COMMUNITY): Payer: Medicare Other | Admitting: Anesthesiology

## 2014-03-24 DIAGNOSIS — L98499 Non-pressure chronic ulcer of skin of other sites with unspecified severity: Secondary | ICD-10-CM

## 2014-03-24 DIAGNOSIS — I739 Peripheral vascular disease, unspecified: Secondary | ICD-10-CM | POA: Diagnosis present

## 2014-03-24 HISTORY — PX: ENDARTERECTOMY FEMORAL: SHX5804

## 2014-03-24 LAB — BASIC METABOLIC PANEL
BUN: 18 mg/dL (ref 6–23)
CO2: 23 mEq/L (ref 19–32)
Calcium: 8.7 mg/dL (ref 8.4–10.5)
Chloride: 95 mEq/L — ABNORMAL LOW (ref 96–112)
Creatinine, Ser: 1 mg/dL (ref 0.50–1.35)
GFR calc non Af Amer: 67 mL/min — ABNORMAL LOW (ref >90)
GFR, EST AFRICAN AMERICAN: 78 mL/min — AB (ref >90)
Glucose, Bld: 94 mg/dL (ref 70–99)
Potassium: 4.8 mEq/L (ref 3.7–5.3)
Sodium: 129 mEq/L — ABNORMAL LOW (ref 137–147)

## 2014-03-24 LAB — CBC
HCT: 25 % — ABNORMAL LOW (ref 39.0–52.0)
Hemoglobin: 8.3 g/dL — ABNORMAL LOW (ref 13.0–17.0)
MCH: 29.4 pg (ref 26.0–34.0)
MCHC: 33.2 g/dL (ref 30.0–36.0)
MCV: 88.7 fL (ref 78.0–100.0)
PLATELETS: 176 10*3/uL (ref 150–400)
RBC: 2.82 MIL/uL — ABNORMAL LOW (ref 4.22–5.81)
RDW: 15.9 % — ABNORMAL HIGH (ref 11.5–15.5)
WBC: 5.7 10*3/uL (ref 4.0–10.5)

## 2014-03-24 LAB — PREPARE RBC (CROSSMATCH)

## 2014-03-24 SURGERY — ENDARTERECTOMY, FEMORAL
Anesthesia: General | Site: Leg Upper | Laterality: Left

## 2014-03-24 MED ORDER — HEMOSTATIC AGENTS (NO CHARGE) OPTIME
TOPICAL | Status: DC | PRN
  Administered 2014-03-24: 1 via TOPICAL

## 2014-03-24 MED ORDER — PANTOPRAZOLE SODIUM 40 MG PO TBEC
40.0000 mg | DELAYED_RELEASE_TABLET | Freq: Every day | ORAL | Status: DC
  Administered 2014-03-24 – 2014-03-29 (×4): 40 mg via ORAL
  Filled 2014-03-24 (×4): qty 1

## 2014-03-24 MED ORDER — FENTANYL CITRATE 0.05 MG/ML IJ SOLN
INTRAMUSCULAR | Status: DC | PRN
  Administered 2014-03-24 (×2): 50 ug via INTRAVENOUS
  Administered 2014-03-24: 100 ug via INTRAVENOUS
  Administered 2014-03-24: 50 ug via INTRAVENOUS

## 2014-03-24 MED ORDER — HEPARIN SODIUM (PORCINE) 1000 UNIT/ML IJ SOLN
INTRAMUSCULAR | Status: DC | PRN
  Administered 2014-03-24: 7000 [IU] via INTRAVENOUS

## 2014-03-24 MED ORDER — NEOSTIGMINE METHYLSULFATE 10 MG/10ML IV SOLN
INTRAVENOUS | Status: AC
  Filled 2014-03-24: qty 1

## 2014-03-24 MED ORDER — DEXTROSE 5 % IV SOLN
1.5000 g | Freq: Two times a day (BID) | INTRAVENOUS | Status: AC
  Administered 2014-03-24 – 2014-03-25 (×2): 1.5 g via INTRAVENOUS
  Filled 2014-03-24 (×3): qty 1.5

## 2014-03-24 MED ORDER — PROTAMINE SULFATE 10 MG/ML IV SOLN
INTRAVENOUS | Status: DC | PRN
  Administered 2014-03-24 (×3): 10 mg via INTRAVENOUS

## 2014-03-24 MED ORDER — FENTANYL CITRATE 0.05 MG/ML IJ SOLN
INTRAMUSCULAR | Status: AC
  Filled 2014-03-24: qty 2

## 2014-03-24 MED ORDER — ONDANSETRON HCL 4 MG/2ML IJ SOLN
INTRAMUSCULAR | Status: DC | PRN
  Administered 2014-03-24: 4 mg via INTRAVENOUS

## 2014-03-24 MED ORDER — GLYCOPYRROLATE 0.2 MG/ML IJ SOLN
INTRAMUSCULAR | Status: AC
  Filled 2014-03-24: qty 5

## 2014-03-24 MED ORDER — ONDANSETRON HCL 4 MG/2ML IJ SOLN
INTRAMUSCULAR | Status: AC
  Filled 2014-03-24: qty 2

## 2014-03-24 MED ORDER — DROPERIDOL 2.5 MG/ML IJ SOLN
0.6250 mg | INTRAMUSCULAR | Status: DC | PRN
  Filled 2014-03-24: qty 0.25

## 2014-03-24 MED ORDER — THROMBIN 20000 UNITS EX SOLR
CUTANEOUS | Status: AC
  Filled 2014-03-24: qty 20000

## 2014-03-24 MED ORDER — SODIUM CHLORIDE 0.9 % IR SOLN
Status: DC | PRN
  Administered 2014-03-24: 07:00:00

## 2014-03-24 MED ORDER — ENOXAPARIN SODIUM 30 MG/0.3ML ~~LOC~~ SOLN
30.0000 mg | SUBCUTANEOUS | Status: DC
  Administered 2014-03-25 – 2014-03-29 (×5): 30 mg via SUBCUTANEOUS
  Filled 2014-03-24 (×5): qty 0.3

## 2014-03-24 MED ORDER — POTASSIUM CHLORIDE CRYS ER 20 MEQ PO TBCR: 20.0000 meq | EXTENDED_RELEASE_TABLET | Freq: Every day | ORAL | Status: DC | PRN

## 2014-03-24 MED ORDER — 0.9 % SODIUM CHLORIDE (POUR BTL) OPTIME
TOPICAL | Status: DC | PRN
  Administered 2014-03-24 (×2): 1000 mL

## 2014-03-24 MED ORDER — FENTANYL CITRATE 0.05 MG/ML IJ SOLN
25.0000 ug | INTRAMUSCULAR | Status: DC | PRN
  Administered 2014-03-24: 50 ug via INTRAVENOUS

## 2014-03-24 MED ORDER — DOCUSATE SODIUM 100 MG PO CAPS
100.0000 mg | ORAL_CAPSULE | Freq: Every day | ORAL | Status: DC
  Administered 2014-03-28 – 2014-03-29 (×2): 100 mg via ORAL
  Filled 2014-03-24 (×3): qty 1

## 2014-03-24 MED ORDER — METOPROLOL TARTRATE 1 MG/ML IV SOLN: 2.0000 mg | INTRAVENOUS | Status: DC | PRN

## 2014-03-24 MED ORDER — LABETALOL HCL 5 MG/ML IV SOLN
10.0000 mg | INTRAVENOUS | Status: DC | PRN
  Administered 2014-03-25: 10 mg via INTRAVENOUS
  Filled 2014-03-24 (×2): qty 4

## 2014-03-24 MED ORDER — HEPARIN SODIUM (PORCINE) 1000 UNIT/ML IJ SOLN
INTRAMUSCULAR | Status: AC
  Filled 2014-03-24: qty 1

## 2014-03-24 MED ORDER — PROPOFOL 10 MG/ML IV BOLUS
INTRAVENOUS | Status: AC
  Filled 2014-03-24: qty 20

## 2014-03-24 MED ORDER — ASPIRIN EC 325 MG PO TBEC
325.0000 mg | DELAYED_RELEASE_TABLET | Freq: Every day | ORAL | Status: DC
  Administered 2014-03-27 – 2014-03-29 (×3): 325 mg via ORAL
  Filled 2014-03-24 (×4): qty 1

## 2014-03-24 MED ORDER — NEOSTIGMINE METHYLSULFATE 10 MG/10ML IV SOLN
INTRAVENOUS | Status: DC | PRN
  Administered 2014-03-24: 4 mg via INTRAVENOUS

## 2014-03-24 MED ORDER — PHENOL 1.4 % MT LIQD: 1.0000 | OROMUCOSAL | Status: DC | PRN

## 2014-03-24 MED ORDER — ALUM & MAG HYDROXIDE-SIMETH 200-200-20 MG/5ML PO SUSP: 15.0000 mL | ORAL | Status: DC | PRN

## 2014-03-24 MED ORDER — GLYCOPYRROLATE 0.2 MG/ML IJ SOLN
INTRAMUSCULAR | Status: DC | PRN
  Administered 2014-03-24: 0.2 mg via INTRAVENOUS
  Administered 2014-03-24: .8 mg via INTRAVENOUS

## 2014-03-24 MED ORDER — PHENYLEPHRINE HCL 10 MG/ML IJ SOLN
INTRAMUSCULAR | Status: DC | PRN
  Administered 2014-03-24: 80 ug via INTRAVENOUS

## 2014-03-24 MED ORDER — ACETAMINOPHEN 325 MG RE SUPP
325.0000 mg | RECTAL | Status: DC | PRN
  Filled 2014-03-24: qty 2

## 2014-03-24 MED ORDER — LIDOCAINE HCL (CARDIAC) 20 MG/ML IV SOLN
INTRAVENOUS | Status: DC | PRN
  Administered 2014-03-24: 30 mg via INTRAVENOUS

## 2014-03-24 MED ORDER — FENTANYL CITRATE 0.05 MG/ML IJ SOLN
INTRAMUSCULAR | Status: AC
  Filled 2014-03-24: qty 5

## 2014-03-24 MED ORDER — PROTAMINE SULFATE 10 MG/ML IV SOLN
INTRAVENOUS | Status: AC
  Filled 2014-03-24: qty 5

## 2014-03-24 MED ORDER — ACETAMINOPHEN 325 MG PO TABS: 325.0000 mg | ORAL_TABLET | ORAL | Status: DC | PRN

## 2014-03-24 MED ORDER — ONDANSETRON HCL 4 MG/2ML IJ SOLN
4.0000 mg | Freq: Four times a day (QID) | INTRAMUSCULAR | Status: DC | PRN
  Administered 2014-03-24: 4 mg via INTRAVENOUS
  Filled 2014-03-24: qty 2

## 2014-03-24 MED ORDER — PHENYLEPHRINE HCL 10 MG/ML IJ SOLN
INTRAMUSCULAR | Status: AC
  Filled 2014-03-24: qty 1

## 2014-03-24 MED ORDER — SODIUM CHLORIDE 0.9 % IV SOLN
10.0000 mg | INTRAVENOUS | Status: DC | PRN
  Administered 2014-03-24: 25 ug/min via INTRAVENOUS

## 2014-03-24 MED ORDER — EPHEDRINE SULFATE 50 MG/ML IJ SOLN
INTRAMUSCULAR | Status: DC | PRN
  Administered 2014-03-24: 10 mg via INTRAVENOUS

## 2014-03-24 MED ORDER — PROPOFOL 10 MG/ML IV BOLUS
INTRAVENOUS | Status: DC | PRN
  Administered 2014-03-24: 40 mg via INTRAVENOUS

## 2014-03-24 MED ORDER — SODIUM CHLORIDE 0.9 % IV SOLN: 500.0000 mL | Freq: Once | INTRAVENOUS | Status: AC | PRN

## 2014-03-24 MED ORDER — SODIUM CHLORIDE 0.9 % IV SOLN
INTRAVENOUS | Status: DC
  Administered 2014-03-24 – 2014-03-26 (×4): via INTRAVENOUS

## 2014-03-24 MED ORDER — OXYCODONE HCL 5 MG PO TABS: 5.0000 mg | ORAL_TABLET | ORAL | Status: DC | PRN

## 2014-03-24 MED ORDER — ROCURONIUM BROMIDE 100 MG/10ML IV SOLN
INTRAVENOUS | Status: DC | PRN
  Administered 2014-03-24: 40 mg via INTRAVENOUS

## 2014-03-24 MED ORDER — LACTATED RINGERS IV SOLN
INTRAVENOUS | Status: DC | PRN
  Administered 2014-03-24 (×2): via INTRAVENOUS

## 2014-03-24 MED ORDER — HYDRALAZINE HCL 20 MG/ML IJ SOLN
10.0000 mg | INTRAMUSCULAR | Status: DC | PRN
  Administered 2014-03-25 (×2): 10 mg via INTRAVENOUS
  Filled 2014-03-24 (×2): qty 1

## 2014-03-24 MED ORDER — HYDROMORPHONE HCL PF 1 MG/ML IJ SOLN
0.5000 mg | INTRAMUSCULAR | Status: DC | PRN
  Administered 2014-03-24 (×2): 1 mg via INTRAVENOUS
  Filled 2014-03-24 (×2): qty 1

## 2014-03-24 MED ORDER — LIDOCAINE HCL (CARDIAC) 20 MG/ML IV SOLN
INTRAVENOUS | Status: AC
  Filled 2014-03-24: qty 5

## 2014-03-24 MED ORDER — FENTANYL CITRATE 0.05 MG/ML IJ SOLN
INTRAMUSCULAR | Status: AC
Start: 2014-03-24 — End: 2014-03-24
  Filled 2014-03-24: qty 5

## 2014-03-24 MED ORDER — GUAIFENESIN-DM 100-10 MG/5ML PO SYRP: 15.0000 mL | ORAL_SOLUTION | ORAL | Status: DC | PRN

## 2014-03-24 MED ORDER — PHENYLEPHRINE 40 MCG/ML (10ML) SYRINGE FOR IV PUSH (FOR BLOOD PRESSURE SUPPORT)
PREFILLED_SYRINGE | INTRAVENOUS | Status: AC
  Filled 2014-03-24: qty 10

## 2014-03-24 SURGICAL SUPPLY — 49 items
ADH SKN CLS LQ APL DERMABOND (GAUZE/BANDAGES/DRESSINGS) ×1
BAG ISL DRAPE 18X18 STRL (DRAPES) ×1
BAG ISOLATION DRAPE 18X18 (DRAPES) ×1 IMPLANT
BANDAGE ELASTIC 4 VELCRO ST LF (GAUZE/BANDAGES/DRESSINGS) IMPLANT
BLADE 10 SAFETY STRL DISP (BLADE) ×3 IMPLANT
CANISTER SUCTION 2500CC (MISCELLANEOUS) ×3 IMPLANT
CLIP TI MEDIUM 24 (CLIP) ×3 IMPLANT
CLIP TI WIDE RED SMALL 24 (CLIP) ×3 IMPLANT
COVER SURGICAL LIGHT HANDLE (MISCELLANEOUS) ×3 IMPLANT
DERMABOND ADHESIVE PROPEN (GAUZE/BANDAGES/DRESSINGS) ×2
DERMABOND ADVANCED .7 DNX6 (GAUZE/BANDAGES/DRESSINGS) ×1 IMPLANT
DRAIN CHANNEL 15F RND FF W/TCR (WOUND CARE) IMPLANT
DRAPE ISOLATION BAG 18X18 (DRAPES) ×2
DRAPE WARM FLUID 44X44 (DRAPE) ×3 IMPLANT
DRSG COVADERM 4X8 (GAUZE/BANDAGES/DRESSINGS) IMPLANT
ELECT REM PT RETURN 9FT ADLT (ELECTROSURGICAL) ×3
ELECTRODE REM PT RTRN 9FT ADLT (ELECTROSURGICAL) ×1 IMPLANT
EVACUATOR SILICONE 100CC (DRAIN) IMPLANT
GLOVE BIO SURGEON STRL SZ 6.5 (GLOVE) ×8 IMPLANT
GLOVE BIO SURGEON STRL SZ7.5 (GLOVE) ×3 IMPLANT
GLOVE BIO SURGEONS STRL SZ 6.5 (GLOVE) ×4
GLOVE BIOGEL PI IND STRL 8 (GLOVE) ×1 IMPLANT
GLOVE BIOGEL PI INDICATOR 8 (GLOVE) ×2
GLOVE SKINSENSE NS SZ7.0 (GLOVE) ×2
GLOVE SKINSENSE STRL SZ7.0 (GLOVE) ×1 IMPLANT
GOWN STRL REUS W/ TWL LRG LVL3 (GOWN DISPOSABLE) ×3 IMPLANT
GOWN STRL REUS W/TWL LRG LVL3 (GOWN DISPOSABLE) ×6
HEMOSTAT SURGICEL 2X14 (HEMOSTASIS) ×3 IMPLANT
KIT BASIN OR (CUSTOM PROCEDURE TRAY) ×3 IMPLANT
KIT ROOM TURNOVER OR (KITS) ×3 IMPLANT
NS IRRIG 1000ML POUR BTL (IV SOLUTION) ×6 IMPLANT
PACK PERIPHERAL VASCULAR (CUSTOM PROCEDURE TRAY) ×3 IMPLANT
PAD ARMBOARD 7.5X6 YLW CONV (MISCELLANEOUS) ×6 IMPLANT
SPONGE INTESTINAL PEANUT (DISPOSABLE) ×3 IMPLANT
SPONGE SURGIFOAM ABS GEL 100 (HEMOSTASIS) IMPLANT
STAPLER VISISTAT 35W (STAPLE) IMPLANT
SUT PROLENE 5 0 C 1 24 (SUTURE) ×18 IMPLANT
SUT PROLENE 6 0 BV (SUTURE) ×9 IMPLANT
SUT SILK 2 0 (SUTURE) ×2
SUT SILK 2-0 18XBRD TIE 12 (SUTURE) ×1 IMPLANT
SUT VIC AB 2-0 CTB1 (SUTURE) ×3 IMPLANT
SUT VIC AB 3-0 SH 27 (SUTURE) ×8
SUT VIC AB 3-0 SH 27X BRD (SUTURE) ×4 IMPLANT
SUT VICRYL 4-0 PS2 18IN ABS (SUTURE) ×6 IMPLANT
TOWEL OR 17X24 6PK STRL BLUE (TOWEL DISPOSABLE) ×6 IMPLANT
TOWEL OR 17X26 10 PK STRL BLUE (TOWEL DISPOSABLE) ×6 IMPLANT
TRAY FOLEY CATH 16FRSI W/METER (SET/KITS/TRAYS/PACK) ×3 IMPLANT
UNDERPAD 30X30 INCONTINENT (UNDERPADS AND DIAPERS) ×3 IMPLANT
WATER STERILE IRR 1000ML POUR (IV SOLUTION) ×3 IMPLANT

## 2014-03-24 NOTE — Progress Notes (Signed)
       Patient Name: Timothy [name] Date of Encounter: 03/24/2014    SUBJECTIVE:No chest pain or dyspnea.  TELEMETRY:  NSR Filed Vitals:   03/24/14 1245 03/24/14 1300 03/24/14 1315 03/24/14 1344  BP: 147/69 154/56 143/68 134/60  Pulse: 71 71 77 79  Temp:    97.6 F (36.4 C)  TempSrc:    Oral  Resp: 12 11 21 17   Height:    5\' 10"  (1.778 m)  Weight:    190 lb 7.6 oz (86.4 kg)  SpO2: 100% 100% 100% 100%    Intake/Output Summary (Last 24 hours) at 03/24/14 1521 Last data filed at 03/24/14 1400  Gross per 24 hour  Intake   2060 ml  Output   1600 ml  Net    460 ml   LABS: Basic Metabolic Panel:  Recent Labs  03/23/14 0550 03/24/14 0432  NA 127* 129*  K 4.5 4.8  CL 94* 95*  CO2 22 23  GLUCOSE 92 94  BUN 18 18  CREATININE 0.97 1.00  CALCIUM 8.7 8.7   CBC:  Recent Labs  03/23/14 0550 03/24/14 0432  WBC 5.7 5.7  HGB 8.7* 8.3*  HCT 26.3* 25.0*  MCV 87.7 88.7  PLT 193 176    Radiology/Studies:  No new data  Physical Exam: Blood pressure 134/60, pulse 79, temperature 97.6 F (36.4 C), temperature source Oral, resp. rate 17, height 5\' 10"  (1.778 m), weight 190 lb 7.6 oz (86.4 kg), SpO2 100.00%. Weight change:   Wt Readings from Last 3 Encounters:  03/24/14 190 lb 7.6 oz (86.4 kg)  03/24/14 190 lb 7.6 oz (86.4 kg)  03/24/14 190 lb 7.6 oz (86.4 kg)    Chest clear Cardiac with no gallop or murmur  ASSESSMENT:  1. CAD stable post op. 2. S/P surgery for PAD.  Plan:  Clinically stable with no immediate cardiac problems post op. Please call if we can help further.  Signed, Belva Crome III 03/24/2014, 3:21 PM

## 2014-03-24 NOTE — Anesthesia Postprocedure Evaluation (Signed)
  Anesthesia Post-op Note  Patient: Timothy [name]  Procedure(s) Performed: Procedure(s): Left Common Femoral  Artery Endarterectomy with Vein Patch Angioplasty,  Ultrasound  (Left)  Patient Location: PACU  Anesthesia Type:General  Level of Consciousness: awake, alert , oriented and patient cooperative  Airway and Oxygen Therapy: Patient Spontanous Breathing and Patient connected to nasal cannula oxygen  Post-op Pain: none  Post-op Assessment: Post-op Vital signs reviewed, Patient's Cardiovascular Status Stable, Respiratory Function Stable, Patent Airway, No signs of Nausea or vomiting and Pain level controlled  Post-op Vital Signs: Reviewed and stable  Last Vitals:  Filed Vitals:   03/24/14 1215  BP: 140/59  Pulse: 78  Temp:   Resp: 16    Complications: No apparent anesthesia complications

## 2014-03-24 NOTE — Interval H&P Note (Signed)
History and Physical Interval Note:  03/24/2014 7:22 AM  Timothy [name]  has presented today for surgery, with the diagnosis of Critical Limb Ischemia Peripheral Vascular Disease  The various methods of treatment have been discussed with the patient and family. After consideration of risks, benefits and other options for treatment, the patient has consented to  Procedure(s): LEFT COMMOM FEMORAL ARTERY ENDARTERECTOMY (Left) as a surgical intervention .  The patient's history has been reviewed, patient examined, no change in status, stable for surgery.  I have reviewed the patient's chart and labs.  Questions were answered to the patient's satisfaction.     Angelia Mould

## 2014-03-24 NOTE — Anesthesia Preprocedure Evaluation (Addendum)
Anesthesia Evaluation  Patient identified by MRN, date of birth, ID band Patient awake    Reviewed: Allergy & Precautions, H&P , NPO status , Patient's Chart, lab work & pertinent test results, reviewed documented beta blocker date and time   Airway Mallampati: II TM Distance: >3 FB Neck ROM: full    Dental  (+) Teeth Intact, Dental Advidsory Given, Caps   Pulmonary former smoker (quit '86),  breath sounds clear to auscultation  Pulmonary exam normal       Cardiovascular hypertension, Pt. on medications and Pt. on home beta blockers + CAD, + Past MI, + CABG, + Peripheral Vascular Disease and +CHF Rhythm:Regular Rate:Normal  '14 ECHO: EF 35-40%, valves OK   Neuro/Psych  Neuromuscular disease    GI/Hepatic negative GI ROS, Neg liver ROS,   Endo/Other    Renal/GU Renal InsufficiencyRenal disease (creat 1.0 )     Musculoskeletal   Abdominal   Peds  Hematology  (+) Blood dyscrasia (Hb 8.3), anemia ,   Anesthesia Other Findings Multiple myeloma  Reproductive/Obstetrics                         Anesthesia Physical Anesthesia Plan  ASA: III  Anesthesia Plan: General   Post-op Pain Management:    Induction: Intravenous  Airway Management Planned: Oral ETT  Additional Equipment:   Intra-op Plan:   Post-operative Plan: Extubation in OR  Informed Consent: I have reviewed the patients History and Physical, chart, labs and discussed the procedure including the risks, benefits and alternatives for the proposed anesthesia with the patient or authorized representative who has indicated his/her understanding and acceptance.   Dental Advisory Given and Dental advisory given  Plan Discussed with: Anesthesiologist, CRNA and Surgeon  Anesthesia Plan Comments: (Plan routine monitors, GETA)       Anesthesia Quick Evaluation

## 2014-03-24 NOTE — Op Note (Signed)
    NAME: Timothy [name]   MRN: [id number] DOB: [birth year]    DATE OF OPERATION: 03/24/2014  PREOP DIAGNOSIS: Critical limb ischemia left leg with multilevel arterial occlusive disease  POSTOP DIAGNOSIS: same  PROCEDURE: Left common femoral artery endarterectomy with vein patch angioplasty using left greater saphenous vein  SURGEON: Judeth Cornfield. Scot Dock, MD, FACS  ASSIST: Leontine Locket, PA  ANESTHESIA: Gen.   EBL: 100 cc  INDICATIONS: Timothy [name] is a 80 y.o. male with nonhealing wounds of his left foot. He underwent an arteriogram was found to have an eccentric calcified plaque in the left common femoral artery. He presents for left common femoral artery endarterectomy with vein patch angioplasty  FINDINGS: The patient had a markedly calcified coral reef plaque extending over a length of approximately 5 cm.  TECHNIQUE: The patient was taken to the operating room and received a general anesthetic. The left lower extremity and lower abdomen were prepped and draped in usual sterile fashion. A longitudinal incision was made above the inguinal crease. Dissection was carried down to the common femoral artery which was markedly calcified had to dissected well up under the inguinal ligament in order to find a soft bar on the external iliac artery where I could clamp. Multiple branches were controlled with red vessel loops. Distally the superficial femoral artery was controlled and the deep femoral artery. In addition there was one large posterior branch which was controlled. Saphenofemoral junction was dissected free and the greater saphenous vein harvested with branches divided between clips and 3-0 silk ties. Given the length of the plaque I thought was to get adequate length of vein and made a separate small incision along the medial aspect of the final report harvest more greater saphenous vein in the proximal thigh. Branches were divided between clips and 3-0 silk ties. The vein was ligated  distally and proximally and excised and then opened longitudinally to be used as a vein patch. The patient was then heparinized.  Clamps were then placed on the external iliac artery and then the common femoral, superficial femoral, and all branches were controlled. A longitudinal arteriotomy was made in the common femoral artery. This was extended proximally and distally. An endarterectomy plane was established in the large coral reef calcified plaque endarterectomized. Distally the calcific disease in the superficial femoral artery extended indefinitely. I was able to tack the plaque distally. The artery was irrigated with copious amounts of heparin and all loose debris removed. The vein patch was then sewn in reversed fashion using 2 continuous 5-0 Prolene sutures. Prior to completing the patch closure the artery was backbled and flushed properly and there was good backbleeding also good inflow. Hemostasis was obtained in the wound. The vein harvest site in the thigh was closed the deep layer 3-0 Vicryl and the skin closed with 4-0 Vicryl. The groin incision was closed the deep layer to close the femoral sheath with 2-0 Vicryl. The subcutaneous layer was closed with 2-0 Vicryl and a layer of 3-0 Vicryl. The skin was closed with 40 subcutaneous stitch. Dermabond was applied. Patient tolerated procedure well was transferred to the recovery room in stable condition. All needle and sponge counts were correct.  Deitra Mayo, MD, FACS [facility]  DATE OF DICTATION:   03/24/2014

## 2014-03-24 NOTE — Anesthesia Procedure Notes (Signed)
Procedure Name: Intubation Date/Time: 03/24/2014 7:37 AM Performed by: Neldon Newport Pre-anesthesia Checklist: Patient identified, Timeout performed, Emergency Drugs available, Suction available and Patient being monitored Patient Re-evaluated:Patient Re-evaluated prior to inductionOxygen Delivery Method: Circle system utilized Preoxygenation: Pre-oxygenation with 100% oxygen Intubation Type: IV induction Ventilation: Mask ventilation without difficulty Laryngoscope Size: Mac and 3 Grade View: Grade II Tube type: Oral Tube size: 7.5 mm Number of attempts: 1 Placement Confirmation: positive ETCO2 and breath sounds checked- equal and bilateral Secured at: 23 cm Tube secured with: Tape Dental Injury: Teeth and Oropharynx as per pre-operative assessment

## 2014-03-24 NOTE — H&P (View-Only) (Signed)
Vascular and Vein Specialist of [facility]  Patient name: Timothy [name] MRN: [id number] DOB: 02/16/1928 Sex: male  REASON FOR CONSULT: Multilevel arterial occlusive disease with limb threatening ischemia of the left lower extremity. Consult is from Dr. Quay Burow.  HPI: Timothy [name] is a 80 y.o. male who had presented with a nonhealing wound of his left heel. He was brought in for arteriography by Dr. Quay Burow. This demonstrated a bulky eccentric plaque in the left common femoral artery and vascular surgery was consult for consideration for endarterectomy and patch angioplasty.  The patient does give a history of left thigh claudication which is brought on by ambulation and relieved with rest. He denies any history of rest pain. He fell off of his lawnmower and fractured his right femur in October. He ultimately went to rehabilitation and developed a heel decubitus. This wound has made progress but has not completely healed. In addition, he has undergone previous left great toe amputation and more recently partial amputation of the right second toe. The second toe amputation site is not healed.  His risk factors for peripheral vascular disease include hypertension and hypercholesterolemia. He denies any history of diabetes, history of premature cardiovascular disease, or tobacco use.  He has undergone previous CABG and states that the vein was taken from the right leg for this procedure back in 2002.  Past Medical History  Diagnosis Date  . ASCVD (arteriosclerotic cardiovascular disease)   . Anemia     chronic mild anemia  . Gout   . Hypertension   . Lumbar disc disease     post laminectomy  . Chronic back pain   . History of echocardiogram 11/02/2010    EF range of 30 to 35% / There is hypokinsesis of the basal-mild inferolateral myocardium  . CHF (congestive heart failure)   . Heart murmur   . Ischemic cardiomyopathy   . Renal insufficiency   . Peripheral neuropathy    . Inferior myocardial infarction 1986  . Hyperlipidemia   . SOB (shortness of breath)   . Fatigue   . Peripheral neuropathy   . Coronary artery disease     CABG 2002 by Dr. Roxy Manns with LIMA to LAD, SVG to intermediate, SVG to LCX, & SVG to PL. Last nuclear in 2012 showing large inferior scar with EF of 39%.   . Blood transfusion     DEC 2012 - TWO UNITS  . Myeloma     Multiple myeloma, with recurrence of increasing problems  DR. Ware Place -ONCOLOGIST.  PT HAS BEEN OFF CHEMO SINCE HIS HOSPITALIZATION FEB 2013 FOR LEFT FOOT INFECTION  . Cancer   . History of shingles MARCH 2009    SHINGLE LESIONS WERE AROUND RIGHT EYE--PT HAS RESIDUAL ITCHING AROUND THE EYE.  . Osteomyelitis     s/p toe amputation  . Chronic systolic heart failure     EF of 30% per echo 02/2012  . Protein malnutrition   . Critical lower limb ischemia    FAMILY HISTORY: He denies any history of premature cardiovascular disease.  History reviewed. No pertinent family history.  SOCIAL HISTORY: History  Substance Use Topics  . Smoking status: Former Smoker -- 1.00 packs/day for 20 years    Types: Cigarettes    Quit date: 11/20/1984  . Smokeless tobacco: Never Used  . Alcohol Use: No    Allergies  Allergen Reactions  . Morphine And Related Other (See Comments)    Agitated with morphine drip    Current  Facility-Administered Medications  Medication Dose Route Frequency Provider Last Rate Last Dose  . 0.9 %  sodium chloride infusion   Intravenous Continuous Lorretta Harp, MD 75 mL/hr at 03/23/14 0900    . aspirin EC tablet 325 mg  325 mg Oral Daily Lorretta Harp, MD      . atorvastatin (LIPITOR) tablet 10 mg  10 mg Oral QPM Mihai Croitoru, MD   10 mg at 03/22/14 1814  . carvedilol (COREG) tablet 6.25 mg  6.25 mg Oral BID WC Mihai Croitoru, MD   6.25 mg at 03/23/14 6546  . DULoxetine (CYMBALTA) DR capsule 60 mg  60 mg Oral QPM Lorretta Harp, MD      . HYDROmorphone (DILAUDID) tablet 6 mg  6 mg Oral Q6H PRN  Sanda Klein, MD   6 mg at 03/23/14 0546  . lisinopril (PRINIVIL,ZESTRIL) tablet 20 mg  20 mg Oral Daily Mihai Croitoru, MD   20 mg at 03/22/14 1813  . morphine 2 MG/ML injection 1 mg  1 mg Intravenous Q1H PRN Lorretta Harp, MD      . nitroGLYCERIN (NITROSTAT) SL tablet 0.4 mg  0.4 mg Sublingual Q5 min PRN Mihai Croitoru, MD      . polyethylene glycol (MIRALAX / GLYCOLAX) packet 17 g  17 g Oral Daily PRN Mihai Croitoru, MD      . rOPINIRole (REQUIP) tablet 3 mg  3 mg Oral QHS Mihai Croitoru, MD   3 mg at 03/22/14 2125  . tamsulosin (FLOMAX) capsule 0.4 mg  0.4 mg Oral QHS Mihai Croitoru, MD   0.4 mg at 03/22/14 2125  . tobramycin (TOBREX) 0.3 % ophthalmic solution 1 drop  1 drop Both Eyes Daily PRN Mihai Croitoru, MD        REVIEW OF SYSTEMS: Valu.Nieves ] denotes positive finding; [  ] denotes negative finding CARDIOVASCULAR:  [ ]  chest pain   [ ]  chest pressure   [ ]  palpitations   [ ]  orthopnea   Valu.Nieves ] dyspnea on exertion   [ ]  claudication   [ ]  rest pain   [ ]  DVT  Valu.Nieves ] PE  PULMONARY:   [ ]  productive cough   [ ]  asthma   [ ]  wheezing NEUROLOGIC:   [ ]  weakness  [ ]  paresthesias  [ ]  aphasia  [ ]  amaurosis  [ ]  dizziness HEMATOLOGIC:   [ ]  bleeding problems   [ ]  clotting disorders MUSCULOSKELETAL:  [ ]  joint pain   [ ]  joint swelling [ ]  leg swelling GASTROINTESTINAL: [ ]   blood in stool  [ ]   hematemesis GENITOURINARY:  [ ]   dysuria  [ ]   hematuria PSYCHIATRIC:  [ ]  history of major depression INTEGUMENTARY:  [ ]  rashes  [ ]  ulcers CONSTITUTIONAL:  [ ]  fever   [ ]  chills  PHYSICAL EXAM: Filed Vitals:   03/23/14 0804 03/23/14 0937 03/23/14 0945 03/23/14 1000  BP:  111/48 144/54 134/67  Pulse: 58 56 70 60  Temp:  97.3 F (36.3 C)    TempSrc:  Oral    Resp:  16    Height:      Weight:      SpO2:  99%     Body mass index is 26.92 kg/(m^2). GENERAL: The patient is a well-nourished male, in no acute distress. The vital signs are documented above. CARDIOVASCULAR: There is a regular  rate and rhythm. I do not detect carotid bruits. He has palpable radial pulses. On the  left side, which is the symptomatic side, I cannot palpate a femoral pulse. I cannot palpate a left popliteal, dorsalis pedis, or posterior tibial pulse. He has a palpable right femoral pulse. I cannot palpate pulses in the right foot. The right foot is warm and well-perfused. PULMONARY: There is good air exchange bilaterally without wheezing or rales. ABDOMEN: Soft and non-tender with normal pitched bowel sounds.  MUSCULOSKELETAL: He has had previous left great toe amputation. He has had a partial amputation of the left second toe. This wound is open with some exposed bone. NEUROLOGIC: No focal weakness or paresthesias are detected. SKIN: The left second toe has an exposed wound with exposed bone and minimal cellulitis. PSYCHIATRIC: The patient has a normal affect.  DATA:  Lab Results  Component Value Date   WBC 5.7 03/23/2014   HGB 8.7* 03/23/2014   HCT 26.3* 03/23/2014   MCV 87.7 03/23/2014   PLT 193 03/23/2014   Lab Results  Component Value Date   NA 127* 03/23/2014   K 4.5 03/23/2014   CL 94* 03/23/2014   CO2 22 03/23/2014   Lab Results  Component Value Date   CREATININE 0.97 03/23/2014   Lab Results  Component Value Date   INR 1.15 03/23/2014   INR 1.10 03/01/2012   INR 1.09 01/11/2012   PROTIME 20.4* 07/02/2007   PROTIME 13.2 12/05/2006   PROTIME 14.4* 11/29/2006   DATA: I have reviewed his arteriogram which shows a widely patent infrarenal aorta. There is moderate disease of left common iliac artery with no focal stenosis. The external iliac artery is patent. There is a large bulky eccentric plaque in the left common femoral artery. Superficial femoral artery is patent with mild diffuse disease. There is severe tibial artery occlusive disease. The anterior tibial artery is occluded proximally. There is some reconstitution distally but the dorsalis pedis is occluded. The peroneal artery occludes distally. The  posterior tibial artery is occluded proximally with reconstitution of the posterior tibial artery distally. This does not runoff to the foot.  MEDICAL ISSUES:  PERIPHERAL VASCULAR DISEASE WITH NONHEALING WOUNDS OF THE LEFT FOOT: This patient has a nonhealing left heel wound and left second toe amputation site. He has multilevel arterial occlusive disease. I agree with Dr. Kennon Holter assessment that left common femoral endarterectomy is indicated with vein patch angioplasty. Although he does in addition have significant tibial artery occlusive disease, he has not had any good options for a distal by pass given his severe tibial artery occlusive disease. In addition he is 72 with multiple medical comorbidities. However I think improving the inflow by addressing the common femoral artery stenosis should significantly help flow to the left foot and improve his chances of healing the wounds on the left foot. He may need additional work on the left second toe by his podiatrist. I have discussed the indications for left common femoral artery endarterectomy with patch angioplasty. In addition I discussed the potential complications of surgery including but not limited to bleeding, wound healing problems, and infection. All of his questions were answered and he is agreeable to proceed.   Angelia Mould [facility] [place] Beeper: [phone]

## 2014-03-24 NOTE — Transfer of Care (Signed)
Immediate Anesthesia Transfer of Care Note  Patient: Timothy [name]  Procedure(s) Performed: Procedure(s): Left Common Femoral  Artery Endarterectomy with Vein Patch Angioplasty,  Ultrasound  (Left)  Patient Location: PACU  Anesthesia Type:General  Level of Consciousness: awake  Airway & Oxygen Therapy: Patient Spontanous Breathing and Patient connected to nasal cannula oxygen  Post-op Assessment: Report given to PACU RN and Post -op Vital signs reviewed and stable  Post vital signs: Reviewed and stable  Complications: No apparent anesthesia complications

## 2014-03-24 NOTE — Progress Notes (Signed)
Pharmacy Consult Note: Initial Note  Pharmacy consulted to manage post-op Zinacef. Currently pt is on Zinacef 1.5 gm IV Q 12 h x 2 doses. CrCl ~ 58. Dose is appropriate. No further interventions needed. Pharmacy will sign off.   Albertina Parr, PharmD.  Clinical Pharmacist Pager [phone]

## 2014-03-24 NOTE — Telephone Encounter (Addendum)
Message copied by Gena Fray on Tue Mar 24, 2014  2:20 PM ------      Message from: Mena Goes      Created: Tue Mar 24, 2014 10:57 AM      Regarding: Schedule                   ----- Message -----         From: Gabriel Earing, PA-C         Sent: 03/24/2014  10:43 AM           To: Vvs Charge Pool            S/p left endart of left femoral artery 03/24/14.  F/u with Dr. Scot Dock in 2 weeks.            Thanks,      Aldona Bar ------  03/24/14: lm for pt, dpm

## 2014-03-24 NOTE — Progress Notes (Signed)
   VASCULAR PROGRESS NOTE  SUBJECTIVE: Comfortable  PHYSICAL EXAM: Filed Vitals:   03/24/14 1240 03/24/14 1245 03/24/14 1300 03/24/14 1315  BP:  147/69 154/56 143/68  Pulse: 72 71 71 77  Temp: 97.7 F (36.5 C)     TempSrc:      Resp: 10 12 11 21   Height:      Weight:      SpO2: 100% 100% 100% 100%   Brisk ATA signal with doppler. Left foot warm Incisions look fine.  LABS: Lab Results  Component Value Date   WBC 5.7 03/24/2014   HGB 8.3* 03/24/2014   HCT 25.0* 03/24/2014   MCV 88.7 03/24/2014   PLT 176 03/24/2014   Lab Results  Component Value Date   CREATININE 1.00 03/24/2014   Lab Results  Component Value Date   INR 1.15 03/23/2014   PROTIME 20.4* 07/02/2007   Active Problems:   Critical lower limb ischemia   Atherosclerotic PVD with ulceration   PAD (peripheral artery disease)   ASSESSMENT AND PLAN:  * Stable post op   Gae Gallop Beeper: [phone] 03/24/2014

## 2014-03-25 LAB — COMPREHENSIVE METABOLIC PANEL
ALT: 10 U/L (ref 0–53)
AST: 25 U/L (ref 0–37)
Albumin: 3.2 g/dL — ABNORMAL LOW (ref 3.5–5.2)
Alkaline Phosphatase: 42 U/L (ref 39–117)
BUN: 17 mg/dL (ref 6–23)
CO2: 21 mEq/L (ref 19–32)
Calcium: 8.6 mg/dL (ref 8.4–10.5)
Chloride: 93 mEq/L — ABNORMAL LOW (ref 96–112)
Creatinine, Ser: 0.96 mg/dL (ref 0.50–1.35)
GFR calc Af Amer: 86 mL/min — ABNORMAL LOW (ref >90)
GFR, EST NON AFRICAN AMERICAN: 75 mL/min — AB (ref >90)
Glucose, Bld: 148 mg/dL — ABNORMAL HIGH (ref 70–99)
Potassium: 3.8 mEq/L (ref 3.7–5.3)
SODIUM: 129 meq/L — AB (ref 137–147)
Total Bilirubin: 0.7 mg/dL (ref 0.3–1.2)
Total Protein: 6.9 g/dL (ref 6.0–8.3)

## 2014-03-25 LAB — CK TOTAL AND CKMB (NOT AT ARMC)
CK, MB: 2.8 ng/mL (ref 0.3–4.0)
CK, MB: 2.7 ng/mL (ref 0.3–4.0)
CK, MB: 3.2 ng/mL (ref 0.3–4.0)
RELATIVE INDEX: INVALID (ref 0.0–2.5)
Relative Index: INVALID (ref 0.0–2.5)
Relative Index: INVALID (ref 0.0–2.5)
Total CK: 53 U/L (ref 7–232)
Total CK: 51 U/L (ref 7–232)
Total CK: 60 U/L (ref 7–232)

## 2014-03-25 LAB — AMYLASE: AMYLASE: 60 U/L (ref 0–105)

## 2014-03-25 LAB — CBC WITH DIFFERENTIAL/PLATELET
Basophils Absolute: 0 10*3/uL (ref 0.0–0.1)
Basophils Relative: 0 % (ref 0–1)
Eosinophils Absolute: 0 10*3/uL (ref 0.0–0.7)
Eosinophils Relative: 0 % (ref 0–5)
HCT: 28.4 % — ABNORMAL LOW (ref 39.0–52.0)
Hemoglobin: 9.5 g/dL — ABNORMAL LOW (ref 13.0–17.0)
LYMPHS PCT: 8 % — AB (ref 12–46)
Lymphs Abs: 1.4 10*3/uL (ref 0.7–4.0)
MCH: 29.3 pg (ref 26.0–34.0)
MCHC: 33.5 g/dL (ref 30.0–36.0)
MCV: 87.7 fL (ref 78.0–100.0)
Monocytes Absolute: 1.4 10*3/uL — ABNORMAL HIGH (ref 0.1–1.0)
Monocytes Relative: 8 % (ref 3–12)
Neutro Abs: 14 10*3/uL — ABNORMAL HIGH (ref 1.7–7.7)
Neutrophils Relative %: 84 % — ABNORMAL HIGH (ref 43–77)
PLATELETS: 223 10*3/uL (ref 150–400)
RBC: 3.24 MIL/uL — AB (ref 4.22–5.81)
RDW: 16.2 % — ABNORMAL HIGH (ref 11.5–15.5)
WBC: 16.8 10*3/uL — AB (ref 4.0–10.5)

## 2014-03-25 LAB — TROPONIN I
Troponin I: <0.3 ng/mL (ref <0.30)
Troponin I: 0.52 ng/mL (ref <0.30)

## 2014-03-25 LAB — BASIC METABOLIC PANEL
BUN: 14 mg/dL (ref 6–23)
CALCIUM: 8.8 mg/dL (ref 8.4–10.5)
CHLORIDE: 90 meq/L — AB (ref 96–112)
CO2: 23 mEq/L (ref 19–32)
CREATININE: 0.96 mg/dL (ref 0.50–1.35)
GFR calc non Af Amer: 75 mL/min — ABNORMAL LOW (ref >90)
GFR, EST AFRICAN AMERICAN: 86 mL/min — AB (ref >90)
Glucose, Bld: 119 mg/dL — ABNORMAL HIGH (ref 70–99)
Potassium: 4.3 mEq/L (ref 3.7–5.3)
Sodium: 126 mEq/L — ABNORMAL LOW (ref 137–147)

## 2014-03-25 LAB — CBC
HEMATOCRIT: 26.8 % — AB (ref 39.0–52.0)
Hemoglobin: 8.9 g/dL — ABNORMAL LOW (ref 13.0–17.0)
MCH: 29.3 pg (ref 26.0–34.0)
MCHC: 33.2 g/dL (ref 30.0–36.0)
MCV: 88.2 fL (ref 78.0–100.0)
PLATELETS: 194 10*3/uL (ref 150–400)
RBC: 3.04 MIL/uL — ABNORMAL LOW (ref 4.22–5.81)
RDW: 16.1 % — AB (ref 11.5–15.5)
WBC: 11.6 10*3/uL — AB (ref 4.0–10.5)

## 2014-03-25 MED ORDER — ONDANSETRON HCL 4 MG/2ML IJ SOLN
4.0000 mg | INTRAMUSCULAR | Status: DC | PRN
  Administered 2014-03-25 (×4): 4 mg via INTRAVENOUS
  Filled 2014-03-25 (×3): qty 2

## 2014-03-25 MED ORDER — HYDRALAZINE HCL 20 MG/ML IJ SOLN: 10.0000 mg | INTRAMUSCULAR | Status: DC | PRN

## 2014-03-25 MED ORDER — PROMETHAZINE HCL 25 MG/ML IJ SOLN
12.5000 mg | Freq: Once | INTRAMUSCULAR | Status: AC
  Administered 2014-03-25: 12.5 mg via INTRAVENOUS
  Filled 2014-03-25: qty 1

## 2014-03-25 NOTE — Progress Notes (Addendum)
Vomiting noted. ECG is non ischemic.  Troponin is mildly elevated, but not concerning in absence of symptoms. No cardiac action needed at this time. Repeat ECG in AM.

## 2014-03-25 NOTE — Progress Notes (Signed)
PT Cancellation Note  Patient Details Name: Timothy [name] MRN: [id number] DOB: 1928/03/26   Cancelled Treatment:    Reason Eval/Treat Not Completed: Patient declined, no reason specified. Pt stated "i dont want to get up right now. i just want to sleep" will re-attempt to see pt at next available time.    [place], [place] 03/25/2014, 8:24 AM

## 2014-03-25 NOTE — Progress Notes (Signed)
Occupational Therapy Evaluation Patient Details Name: Timothy [name] MRN: [id number] DOB: 08-27-1928 Today's Date: 03/25/2014    History of Present Illness LEFT COMMOM FEMORAL ARTERY ENDARTERECTOMY due to L LE ischemia.    Clinical Impression   Evaluation limited today by n/v during attempts with bed mobility.Pt very motivated to return to PLOF. Pt will most likely be able to D/C home @ RW level with 24/7 S. No DME needs at this time. Will further assess in am to establish most appropriate D/C plan.    Follow Up Recommendations  Home health OT;Supervision/Assistance - 24 hour    Equipment Recommendations  None recommended by OT    Recommendations for Other Services       Precautions / Restrictions Precautions Precautions: Fall Precaution Comments: L heel wound Restrictions Weight Bearing Restrictions: No      Mobility Bed Mobility Overal bed mobility: Needs Assistance Bed Mobility: Rolling Rolling: Supervision         General bed mobility comments: Became ill druing mobility  Transfers Overall transfer level: Needs assistance                    Balance                                            ADL Overall ADL's : Needs assistance/impaired                                     Functional mobility during ADLs:  (not assessed due to nausea) General ADL Comments: Pt reports most liekly needing assistance with LB ADL     Vision                     Perception     Praxis      Pertinent Vitals/Pain 3/10 pain. LLE C/o nausea     Hand Dominance     Extremity/Trunk Assessment Upper Extremity Assessment Upper Extremity Assessment: Overall WFL for tasks assessed   Lower Extremity Assessment Lower Extremity Assessment: Generalized weakness;LLE deficits/detail (s/p surgery; non healing heel wound) LLE Sensation: history of peripheral neuropathy       Communication Communication Communication: No  difficulties   Cognition Arousal/Alertness: Awake/alert Behavior During Therapy: WFL for tasks assessed/performed Overall Cognitive Status: Within Functional Limits for tasks assessed                     General Comments       Exercises       Shoulder Instructions      Home Living Family/patient expects to be discharged to:: Private residence Living Arrangements: Spouse/significant other Available Help at Discharge: Family;Available 24 hours/day Type of Home: House Home Access: Stairs to enter [employer] of Steps: 1 threshold Entrance Stairs-Rails: None Home Layout: Two level;Able to live on main level with bedroom/bathroom     Bathroom Shower/Tub: Walk-in shower;Door   [employer] Toilet: Handicapped height Bathroom Accessibility: Yes How Accessible: Accessible via walker Home Equipment: [place] - 2 wheels;Bedside commode;Shower seat;Shower seat - built in          Prior Functioning/Environment Level of Independence: Independent with assistive device(s)        Comments: uses RW    OT Diagnosis: Generalized weakness;Acute pain   OT Problem List: Decreased strength;Decreased activity  tolerance;Pain;Decreased safety awareness   OT Treatment/Interventions: Self-care/ADL training;Therapeutic exercise;DME and/or AE instruction;Energy conservation;Therapeutic activities;Patient/family education;Balance training    OT Goals(Current goals can be found in the care plan section) Acute Rehab OT Goals Patient Stated Goal: to go home OT Goal Formulation: With patient Time For Goal Achievement: 04/08/14 Potential to Achieve Goals: Good  OT Frequency: Min 2X/week   Barriers to D/C:            Co-evaluation              End of Session Nurse Communication: Mobility status  Activity Tolerance: Other (comment) (limited by n/v) Patient left: in chair;with call bell/phone within reach   Time: 1703-1716 OT Time Calculation (min): 13 min Charges:   OT General Charges $OT Visit: 1 Procedure OT Evaluation $Initial OT Evaluation Tier I: 1 Procedure G-Codes:    Timothy [name] 04-12-2014, 5:17 PM   [place], OTR/L  [phone] 04/12/14

## 2014-03-25 NOTE — Clinical Documentation Improvement (Signed)
Patient with abnormal lab value. Na+ running - 126 to 129 since admission.    0.9 % NaCl bag hung  Please provide diagnosis associated with abnormal lab value and treatment provided if clinically significant.                                   Thank You, Zoila Shutter ,RN Clinical Documentation Specialist:  [place] Information Management

## 2014-03-25 NOTE — Progress Notes (Signed)
Pt has had nausea and vomiting approximately 4 times, the last time was brown and coffee ground and projectile. VSS, Hbg 8.9 this am. MD Brabham made aware, new orders received. Will continue to monitor.

## 2014-03-25 NOTE — Progress Notes (Signed)
Pt with vomiting episodes, coffee ground fluid, pt unable to tol po meds, MD/N, nursing will cont to monitor

## 2014-03-25 NOTE — Progress Notes (Addendum)
[facility]ists Progress Note 03/25/2014 7:28 AM 1 Day Post-Op  Subjective:  Not feeling good this morning.  He has been vomiting since around 10am last night.  Per RN, zofran did not help, but did get some relief with Phenergan this am.  afebrile 656'C-127'N systolic HR 17'G-01'V regular 99% RA  Filed Vitals:   03/25/14 0623  BP: 171/67  Pulse: 76  Temp:   Resp: 13   Physical Exam: Incisions:  Left groin incision is c/d/i without hematoma Extremities:  Left foot is warm and well perfused. Cardiac:  Regular Lungs:  CTAB Abod[place]:  Soft, NT/ND minimal bowel sounds  CBC Hgb = 8.9 (which is up a little)  BMET CRT = 0.96  INR    Component Value Date/Time   INR 1.15 03/23/2014 0605   INR 1.70* 07/02/2007 1144    Intake/Output Summary (Last 24 hours) at 03/25/14 0728 Last data filed at 03/25/14 0600  Gross per 24 hour  Intake   2540 ml  Output   2000 ml  Net    540 ml   Assessment:  80 y.o. male is s/p:  Left common femoral artery endarterectomy with vein patch angioplasty using left greater saphenous vein  1 Day Post-Op  Plan: -pt with emesis and coffee ground material that was projectile this am.  Nausea appears to have eased off with phenergan.  Troponins ordered by on call MD-no results yet -will send off hemoccult today -hct is stable -increase in WBC-pt is afebrile.  Continue to monitor-will ck CBC in am -hyponatremia that is slightly down today, but stable over past few weeks. (Range 126-130) -check labs in am -increase mobilization and OOB to chair today -discontinue foley -DVT prophylaxis:  Lovenox to start today   Leontine Locket, PA-C [facility] [phone] 03/25/2014 7:28 AM  Agree with above.  Brisk ATA signal with doppler.  His stage III Pressure Ulcer is gradually improving.   Wet-dry dressing changes to toe amp site. Hopefully, home tomorrow if nausea resolves.   Deitra Mayo, MD, [place]  [phone] 03/25/2014

## 2014-03-25 NOTE — Clinical Documentation Improvement (Signed)
PLEASE NOTE IF PRESENT ON ADMISSION  Patient noted to have non healing pressure ulcer of heel.  Please clarify stage of this pressure ulcer if known.  Stage  I  Pressure Ulcer   (reddening of the skin) Stage  II Pressure Ulcer  (blister open or unopened) Stage  III Pressure Ulcer (through all layers skin) Stage IV Pressure Ulcer   (through skin & underlying  muscle, tendons, and bones)    Thank You, Zoila Shutter ,RN Clinical Documentation Specialist:  [place] Information Management

## 2014-03-26 DIAGNOSIS — I214 Non-ST elevation (NSTEMI) myocardial infarction: Secondary | ICD-10-CM

## 2014-03-26 LAB — CBC
HCT: 26.3 % — ABNORMAL LOW (ref 39.0–52.0)
Hemoglobin: 8.7 g/dL — ABNORMAL LOW (ref 13.0–17.0)
MCH: 29 pg (ref 26.0–34.0)
MCHC: 33.1 g/dL (ref 30.0–36.0)
MCV: 87.7 fL (ref 78.0–100.0)
PLATELETS: 198 10*3/uL (ref 150–400)
RBC: 3 MIL/uL — AB (ref 4.22–5.81)
RDW: 16.4 % — ABNORMAL HIGH (ref 11.5–15.5)
WBC: 16.4 10*3/uL — ABNORMAL HIGH (ref 4.0–10.5)

## 2014-03-26 LAB — BASIC METABOLIC PANEL
BUN: 22 mg/dL (ref 6–23)
CALCIUM: 8.5 mg/dL (ref 8.4–10.5)
CO2: 22 meq/L (ref 19–32)
Chloride: 92 mEq/L — ABNORMAL LOW (ref 96–112)
Creatinine, Ser: 1.15 mg/dL (ref 0.50–1.35)
GFR calc Af Amer: 66 mL/min — ABNORMAL LOW (ref >90)
GFR calc non Af Amer: 57 mL/min — ABNORMAL LOW (ref >90)
Glucose, Bld: 142 mg/dL — ABNORMAL HIGH (ref 70–99)
POTASSIUM: 3.8 meq/L (ref 3.7–5.3)
SODIUM: 130 meq/L — AB (ref 137–147)

## 2014-03-26 MED ORDER — BISACODYL 10 MG RE SUPP
10.0000 mg | Freq: Every day | RECTAL | Status: DC | PRN
  Administered 2014-03-26: 10 mg via RECTAL
  Filled 2014-03-26: qty 1

## 2014-03-26 MED ORDER — TAMSULOSIN HCL 0.4 MG PO CAPS
0.4000 mg | ORAL_CAPSULE | Freq: Every day | ORAL | Status: DC
  Administered 2014-03-26 – 2014-03-28 (×3): 0.4 mg via ORAL
  Filled 2014-03-26 (×4): qty 1

## 2014-03-26 NOTE — Evaluation (Signed)
Physical Therapy Evaluation Patient Details Name: Timothy [name] MRN: [id number] DOB: 12/06/1927 Today's Date: 03/26/2014   History of Present Illness  LEFT COMMOM FEMORAL ARTERY ENDARTERECTOMY due to L LE ischemia.   Clinical Impression  Pt admitted with above. Pt currently with functional limitations due to the deficits listed below (see PT Problem List).  Pt will benefit from skilled PT to increase their independence and safety with mobility to allow discharge to the venue listed below.     Follow Up Recommendations CIR;Supervision/Assistance - 24 hour    Equipment Recommendations  None recommended by PT    Recommendations for Other Services       Precautions / Restrictions Precautions Precautions: Fall Precaution Comments: L heel wound Restrictions Weight Bearing Restrictions: No      Mobility  Bed Mobility Overal bed mobility: Needs Assistance Bed Mobility: Rolling;Supine to Sit Rolling: Min assist   Supine to sit: Mod assist     General bed mobility comments: Assit for LEs and elevation of trunk  Transfers Overall transfer level: Needs assistance Equipment used: Rolling walker (2 wheeled) Transfers: Sit to/from [facility] Sit to Stand: Mod assist;From elevated surface Stand pivot transfers: Mod assist;From elevated surface       General transfer comment: Pt needed assist for standing ddue to posterior lean.  Needed alot of tactile and manual cues as well as verbal to stand with flexed posture at trunk, hips and knees.  Pt was able to take pivotal steps to chair using RW with assit given for weight shifting.   Ambulation/Gait                Stairs            Wheelchair Mobility    Modified Rankin (Stroke Patients Only)       Balance Overall balance assessment: Needs assistance;History of Falls Sitting-balance support: Bilateral upper extremity supported;Feet supported Sitting balance-Leahy Scale: Fair   Postural  control: Posterior lean Standing balance support: Bilateral upper extremity supported;During functional activity Standing balance-Leahy Scale: Poor                               Pertinent Vitals/Pain VSS, no pain    Home Living Family/patient expects to be discharged to:: Private residence Living Arrangements: Spouse/significant other Available Help at Discharge: Family;Available 24 hours/day Type of Home: House Home Access: Stairs to enter Entrance Stairs-Rails: None Entrance Stairs-Number of Steps: 1 threshold Home Layout: Two level;Able to live on main level with bedroom/bathroom Home Equipment: Gilford Rile - 2 wheels;Bedside commode;Shower seat;Shower seat - built in      Prior Function Level of Independence: Independent with assistive device(s)         Comments: uses RW     Hand Dominance   Dominant Hand: Left    Extremity/Trunk Assessment   Upper Extremity Assessment: Overall WFL for tasks assessed           Lower Extremity Assessment: Generalized weakness;LLE deficits/detail   LLE Deficits / Details: non healing left heel wound     Communication   Communication: No difficulties  Cognition Arousal/Alertness: Awake/alert Behavior During Therapy: WFL for tasks assessed/performed Overall Cognitive Status: Within Functional Limits for tasks assessed                      General Comments General comments (skin integrity, edema, etc.): left heel wound    Exercises General Exercises - Lower Extremity Ankle  Circles/Pumps: AROM;Both;10 reps;Seated Heel Slides: AROM;Both;10 reps;Supine      Assessment/Plan    PT Assessment Patient needs continued PT services  PT Diagnosis Generalized weakness   PT Problem List Decreased activity tolerance;Decreased balance;Decreased mobility;Decreased knowledge of use of DME;Decreased safety awareness;Decreased knowledge of precautions;Decreased strength  PT Treatment Interventions DME instruction;Gait  training;Functional mobility training;Therapeutic activities;Therapeutic exercise;Balance training;Patient/family education   PT Goals (Current goals can be found in the Care Plan section) Acute Rehab PT Goals Patient Stated Goal: to go home PT Goal Formulation: With patient Time For Goal Achievement: 04/02/14 Potential to Achieve Goals: Good    Frequency Min 3X/week   Barriers to discharge        Co-evaluation               End of Session Equipment Utilized During Treatment: Gait belt;Oxygen Activity Tolerance: Patient limited by fatigue Patient left: in chair;with call bell/phone within reach;with family/visitor present Nurse Communication: Mobility status         Time: 6378-5885 PT Time Calculation (min): 25 min   Charges:   PT Evaluation $Initial PT Evaluation Tier I: 1 Procedure PT Treatments $Therapeutic Activity: 8-22 mins   PT G Codes:          Yancy Knoble Dorene Ar 04/04/2014, 1:11 PM [facility] Acute Rehabilitation [phone] (pager)

## 2014-03-26 NOTE — Progress Notes (Signed)
[facility]  Subjective  -  NG tube placed yesterday evening due to uncontrolled nausea and vomiting.  He states the nausea has subsided since the NG tube has been placed.  He has been unable to void.  He normally takes Flow max.   Objective 137/66 93 97.5 F (36.4 C) (Oral) 18 98%  Intake/Output Summary (Last 24 hours) at 03/26/14 0825 Last data filed at 03/26/14 0700  Gross per 24 hour  Intake   1200 ml  Output   1700 ml  Net   -500 ml   NG output 900 cc dark color    Left groin invasion C/D/I without hematoma Left foot warm and well perfused. No change in heel pressure ulcer or left great toe wound. Doppler biphasic signals left foot PT/DP/Peroneal   Assessment/Planning: POD #2  Pending hemoccult Troponin last 0.52 cardiology has been consulted for f/u they stated no treatment needed at this time or further work up. HGB stable 8.7 with out significant drop Will hold NG suction for Flomax PO today. Will clamp NG tube if no nausea or vomiting in 2-3 hours will D/C NG tube. Dulcolax suppository for constipation.    Timothy [name] 03/26/2014 8:25 AM --  Laboratory Lab Results:  Recent Labs  03/25/14 2115 03/26/14 0329  WBC 16.8* 16.4*  HGB 9.5* 8.7*  HCT 28.4* 26.3*  PLT 223 198   BMET  Recent Labs  03/25/14 2115 03/26/14 0329  NA 129* 130*  K 3.8 3.8  CL 93* 92*  CO2 21 22  GLUCOSE 148* 142*  BUN 17 22  CREATININE 0.96 1.15  CALCIUM 8.6 8.5    COAG Lab Results  Component Value Date   INR 1.15 03/23/2014   INR 1.10 03/01/2012   INR 1.09 01/11/2012   PROTIME 20.4* 07/02/2007   PROTIME 13.2 12/05/2006   PROTIME 14.4* 11/29/2006   No results found for this basename: PTT

## 2014-03-26 NOTE — Progress Notes (Signed)
Occupational Therapy Treatment Patient Details Name: Timothy [name] MRN: [id number] DOB: May 18, 1928 Today's Date: 03/26/2014    History of present illness LEFT COMMOM FEMORAL ARTERY ENDARTERECTOMY due to L LE ischemia.    OT comments  Pt with decreased c/o n/v today. Pt with history of forward flexed posture and balance difficulties since accident last year. Pt required Mod A with sit - stand, but min A with ambulation @ RW level. Easily fatigues.  O2 Sats > 93 RA.  If pt begins to make steady progress after n/v subsides, he may be able to D/C home with 24/7 S and [place] services. If however, pt does not progress, he would benefit from CIR. Pt prefers to go home. Will continue to follow. Will return this pm with theraband for pt to complete ex in room.   Follow Up Recommendations  CIR;Supervision/Assistance - 24 hour    Equipment Recommendations  None recommended by OT    Recommendations for Other Services Other (comment)    Precautions / Restrictions Precautions Precautions: Fall Precaution Comments: L heel wound Restrictions Weight Bearing Restrictions: No       Mobility Bed Mobility Overal bed mobility: Needs Assistance Bed Mobility: Rolling;Supine to Sit Rolling: Min assist   Supine to sit: Mod assist     General bed mobility comments: Assit for LEs and elevation of trunk  Transfers Overall transfer level: Needs assistance Equipment used: Rolling walker (2 wheeled) Transfers: Sit to/from [facility] Sit to Stand: Mod assist (from recliner) Stand pivot transfers: Mod assist;From elevated surface       General transfer comment: PTA, pt with forward head and flexed posture    Balance Overall balance assessment: Needs assistance Sitting-balance support: Bilateral upper extremity supported;Feet supported Sitting balance-Leahy Scale: Fair   Postural control: Posterior lean Standing balance support: Bilateral upper extremity supported Standing  balance-Leahy Scale: Poor                     ADL Overall ADL's : Needs assistance/impaired Eating/Feeding:  (NG tube at this time)   Grooming: Set up;Sitting   Upper Body Bathing: Set up;Sitting   Lower Body Bathing: Moderate assistance;Sit to/from stand   Upper Body Dressing : Set up;Sitting   Lower Body Dressing: Maximal assistance;Sit to/from stand   Toilet Transfer: Moderate assistance;Stand-pivot   Toileting- Clothing Manipulation and Hygiene: Sitting/lateral lean;Minimal assistance Toileting - Clothing Manipulation Details (indicate cue type and reason): currently with foley     Functional mobility during ADLs: Moderate assistance;Rolling walker;Minimal assistance (mod A for sit - stand. Min A with ambulation) General ADL Comments: wife assisting with ADL      Vision                     Perception     Praxis      Cognition   Behavior During Therapy: WFL for tasks assessed/performed Overall Cognitive Status: Within Functional Limits for tasks assessed                       Extremity/Trunk Assessment  Upper Extremity Assessment Upper Extremity Assessment: Overall WFL for tasks assessed   Lower Extremity Assessment Lower Extremity Assessment: Generalized weakness;LLE deficits/detail LLE Deficits / Details: non healing left heel wound LLE Sensation: history of peripheral neuropathy        Exercises General Exercises - Upper Extremity Shoulder Flexion: Strengthening;Right;Left Shoulder ABduction: Strengthening;Left;Right Elbow Flexion: AROM;Strengthening;Right;Left Elbow Extension: Strengthening;Right;Left General Exercises - Lower Extremity Ankle Circles/Pumps: AROM;Both;10  reps;Seated Heel Slides: AROM;Both;10 reps;Supine Other Exercises Other Exercises: given general core strengthening ex   Shoulder Instructions       General Comments      Pertinent Vitals/ Pain       HR low 140s with ambulation       O2 93-100  RA  Home Living Family/patient expects to be discharged to:: Private residence Living Arrangements: Spouse/significant other Available Help at Discharge: Family;Available 24 hours/day Type of Home: House Home Access: Stairs to enter [employer] of Steps: 1 threshold Entrance Stairs-Rails: None Home Layout: Two level;Able to live on main level with bedroom/bathroom               Home Equipment: Gilford Rile - 2 wheels;Bedside commode;Shower seat;Shower seat - built in          Prior Functioning/Environment Level of Independence: Independent with assistive device(s)        Comments: uses RW   Frequency Min 3X/week     Progress Toward Goals  OT Goals(current goals can now be found in the care plan section)  Progress towards OT goals: Progressing toward goals  Acute Rehab OT Goals Patient Stated Goal: to go home OT Goal Formulation: With patient Time For Goal Achievement: 04/08/14 Potential to Achieve Goals: Good ADL Goals Pt Will Perform Lower Body Bathing: with set-up;with supervision;with caregiver independent in assisting;sit to/from stand Pt Will Perform Lower Body Dressing: with set-up;with supervision;with caregiver independent in assisting;sit to/from stand Pt Will Transfer to Toilet: with modified independence;bedside commode;ambulating Pt Will Perform Toileting - Clothing Manipulation and hygiene: with modified independence;sit to/from stand  Plan Discharge plan adjusted;Other (comment);Frequency needs to be updated (Pt currently wanting to go home with [facility])    Co-evaluation                 End of Session Equipment Utilized During Treatment: Gait belt;Rolling walker   Activity Tolerance Patient tolerated treatment well   Patient Left in chair;with call bell/phone within reach;with family/visitor present   Nurse Communication Mobility status        Time: 2725-3664 OT Time Calculation (min): 35 min  Charges: OT General Charges $OT  Visit: 1 Procedure OT Treatments $Self Care/Home Management : 23-37 mins  [name] 03/26/2014, 2:06 PM   [facility], OTR/L  [phone] 03/26/2014

## 2014-03-26 NOTE — Progress Notes (Signed)
Pt still unable to void post foley removal.  Has been I/O cath x2, no urge to void.  Bladder scan shows >300cc.  Foley inserted per protocol.    Vista Lawman, RN

## 2014-03-26 NOTE — Progress Notes (Signed)
Utilization review completed.  

## 2014-03-26 NOTE — Progress Notes (Addendum)
Pt had burst of SVT 170s, returned to [place], now with occasional PVCs.  BP 127/52, pt asymptomatic.  Dr. Bridgett Larsson notified.  No new orders at this time.  Will continue to monitor.  Vista Lawman, RN

## 2014-03-26 NOTE — Progress Notes (Signed)
Rehab Admissions Coordinator Note:  Patient was screened by Cleatrice Burke for appropriateness for an Inpatient Acute Rehab Consult per PT and OT recommendation.  At this time, we are recommending Inpatient Rehab consult. Please place order.   Audelia Acton [facility] 03/26/2014, 2:13 PM  I can be reached at [phone].

## 2014-03-26 NOTE — Progress Notes (Signed)
CRITICAL VALUE ALERT  Critical value received:  Troponin of 0.52  Date of notification:  03/25/2014  Time of notification:  1941  Critical value read back:yes  Nurse who received alert:  Toney Sang  MD notified (1st page):  Cards fellow  Time of first page:  2000  MD notified (2nd page): Dr. Rosalee Kaufman  Time of second page: 2057  Responding MD:  Dr. Rosalee Kaufman  Time MD responded:  2100, no new orders received

## 2014-03-26 NOTE — Progress Notes (Signed)
       Patient Name: Timothy [name] Date of Encounter: 03/26/2014    SUBJECTIVE:No chest pain but developed coffee ground emesis and now has NGT.  TELEMETRY:  NSR Filed Vitals:   03/26/14 0000 03/26/14 0200 03/26/14 0400 03/26/14 0800  BP: 151/69 128/59 137/66 155/66  Pulse: 103 99 93 97  Temp: 98.2 F (36.8 C)  97.5 F (36.4 C) 98.1 F (36.7 C)  TempSrc: Oral  Oral Oral  Resp: 13 18 18 17   Height:      Weight:      SpO2: 98% 97% 98% 98%    Intake/Output Summary (Last 24 hours) at 03/26/14 0924 Last data filed at 03/26/14 0700  Gross per 24 hour  Intake   1200 ml  Output   1700 ml  Net   -500 ml   LABS: Basic Metabolic Panel:  Recent Labs  03/25/14 2115 03/26/14 0329  NA 129* 130*  K 3.8 3.8  CL 93* 92*  CO2 21 22  GLUCOSE 148* 142*  BUN 17 22  CREATININE 0.96 1.15  CALCIUM 8.6 8.5   CBC:  Recent Labs  03/25/14 2115 03/26/14 0329  WBC 16.8* 16.4*  NEUTROABS 14.0*  --   HGB 9.5* 8.7*  HCT 28.4* 26.3*  MCV 87.7 87.7  PLT 223 198   Cardiac Enzymes:  Recent Labs  03/25/14 0705 03/25/14 1130 03/25/14 1825  CKTOTAL 53 51 60  CKMB 2.8 2.7 3.2  TROPONINI <0.30 <0.30 0.52*     Radiology/Studies:  No new data  Physical Exam: Blood pressure 155/66, pulse 97, temperature 98.1 F (36.7 C), temperature source Oral, resp. rate 17, height 5\' 10"  (1.778 m), weight 190 lb 7.6 oz (86.4 kg), SpO2 98.00%. Weight change:   Wt Readings from Last 3 Encounters:  03/24/14 190 lb 7.6 oz (86.4 kg)  03/24/14 190 lb 7.6 oz (86.4 kg)  03/24/14 190 lb 7.6 oz (86.4 kg)    Cardiac exam is unremarkable and the lungs are clear. Abdomen is soft.  ASSESSMENT:  1. Mild troponin elevation, likely ntype 2 NSTEMI related to demand 2. Nausea and coffee ground emesis 3. PAD s/p surgery  Plan:  1. TI today and repeat ECG 2. No specific cardiac action unless data is a Higher education careers adviser.  Signed, Belva Crome III 03/26/2014, 9:24 AM

## 2014-03-27 ENCOUNTER — Encounter (HOSPITAL_COMMUNITY): Payer: Self-pay | Admitting: Vascular Surgery

## 2014-03-27 DIAGNOSIS — Z48812 Encounter for surgical aftercare following surgery on the circulatory system: Secondary | ICD-10-CM

## 2014-03-27 NOTE — Progress Notes (Signed)
Physical medicine rehabilitation consult requested in chart reviewed. Patient doing quite well after evaluation of physical therapy 03/26/2014 ambulating minimal assist and therapy recommends discharge to home with home therapies. Patient will not need inpatient rehabilitation services at this time.

## 2014-03-27 NOTE — Progress Notes (Addendum)
Pt to [place] to 2W19, VSS, called report. Wife present at [facility].

## 2014-03-27 NOTE — Progress Notes (Signed)
VASCULAR LAB PRELIMINARY  ARTERIAL  ABI completed:    RIGHT    LEFT    PRESSURE WAVEFORM  PRESSURE WAVEFORM  BRACHIAL 136 Triphasic BRACHIAL 130 Triphasic  DP 87 Monophasic DP  Monophasic  AT   AT    PT 107 Monophasic PT    PER   PER    GREAT TOE  NA GREAT TOE  NA    RIGHT LEFT  ABI 0.79     The right ABI is suggestive of moderate arterial insufficiency. Unable to calculate left ABI due to patient's inability to tolerate pressure. Unable to insonate left posterior tibial artery due to bandage placement.  03/27/2014 4:23 PM Maudry Mayhew, RVT, RDCS, RDMS

## 2014-03-27 NOTE — Progress Notes (Signed)
   VASCULAR PROGRESS NOTE  SUBJECTIVE: no nausea. Was unable to void last night so Foley catheter was placed.  PHYSICAL EXAM: Filed Vitals:   03/26/14 1603 03/26/14 2000 03/27/14 0000 03/27/14 0400  BP: 148/61 128/53 112/54 138/53  Pulse: 87 87 81 84  Temp: 98.2 F (36.8 C) 98.6 F (37 C) 98.3 F (36.8 C) 97.9 F (36.6 C)  TempSrc: Oral Oral Oral Oral  Resp: 15 16 17 17   Height:      Weight:      SpO2: 96% 95% 92% 97%   Incisions left groin looked fine. Brisk Doppler flow left foot. Wounds stable.  LABS: Lab Results  Component Value Date   WBC 16.4* 03/26/2014   HGB 8.7* 03/26/2014   HCT 26.3* 03/26/2014   MCV 87.7 03/26/2014   PLT 198 03/26/2014   Lab Results  Component Value Date   CREATININE 1.15 03/26/2014   Lab Results  Component Value Date   INR 1.15 03/23/2014   PROTIME 20.4* 07/02/2007   CBG (last 3)  No results found for this basename: GLUCAP,  in the last 72 hours  Active Problems:   Critical lower limb ischemia   Atherosclerotic PVD with ulceration   PAD (peripheral artery disease)   ASSESSMENT AND PLAN:  * 3 Days Post-Op s/p: left common femoral artery and external iliac artery endarterectomy with vein patch angioplasty  * transfer to 2000.  * Will try to get Foley catheter out later today.  * continue physical therapy. He does not want inpatient rehabilitation and wants to go home. He will likely need home health physical therapy.    Gae Gallop Beeper: [phone] 03/27/2014

## 2014-03-27 NOTE — Progress Notes (Signed)
Physical Therapy Treatment Patient Details Name: Timothy [name] MRN: [id number] DOB: 02-06-1928 Today's Date: 03/27/2014    History of Present Illness LEFT COMMOM FEMORAL ARTERY ENDARTERECTOMY due to L LE ischemia.     PT Comments    Patient mobility improved today, ambulated minimal distance in hall with min guard.  Patient expressed desire for dc home tomorrow and does not have interest in rehab at this time. Feel patient is progressing with mobility and may be safe for dc home with HHPT. Encouraged continued ambulation throughout the day with nsg.   Follow Up Recommendations  Home health PT;Supervision/Assistance - 24 hour     Equipment Recommendations  None recommended by PT    Recommendations for Other Services       Precautions / Restrictions Precautions Precautions: Fall Precaution Comments: L heel wound Restrictions Weight Bearing Restrictions: No    Mobility  Bed Mobility Overal bed mobility: Needs Assistance Bed Mobility: Rolling;Supine to Sit Rolling: Supervision   Supine to sit: Supervision     General bed mobility comments: No physical assist needed for bed mobility today  Transfers Overall transfer level: Needs assistance Equipment used: Rolling walker (2 wheeled) Transfers: Sit to/from Stand Sit to Stand: Min guard Stand pivot transfers: Min guard       General transfer comment: VCs for hand placement and safety, no physical assist needed today  Ambulation/Gait Ambulation/Gait assistance: Min guard Ambulation Distance (Feet): 60 Feet Assistive device: Rolling walker (2 wheeled) Gait Pattern/deviations: Step-to pattern;Decreased stride length;Trunk flexed;Narrow base of support Gait velocity: decreased Gait velocity interpretation: Below normal speed for age/gender General Gait Details: patient steady with use of Rw, but very cautious and guarded, complains of some stiffness in calves during ambulation   Stairs            Wheelchair  Mobility    Modified Rankin (Stroke Patients Only)       Balance     Sitting balance-Leahy Scale: Fair       Standing balance-Leahy Scale: Fair                      Cognition Arousal/Alertness: Awake/alert Behavior During Therapy: WFL for tasks assessed/performed Overall Cognitive Status: Within Functional Limits for tasks assessed                      Exercises General Exercises - Lower Extremity Ankle Circles/Pumps: AROM;Both;10 reps;Seated Other Exercises Other Exercises: educated patient on increased ambulation today with nsg, also spoke with patient regarding mobility expectations upon discharge.  patient very intent on going home instead of rehab, and would like to do what he needs to do in order to go home tomorrow after noon.     General Comments General comments (skin integrity, edema, etc.): left heel wound      Pertinent Vitals/Pain Reports no pain, just discomfort in calves during ambulation    Home Living                      Prior Function            PT Goals (current goals can now be found in the care plan section) Acute Rehab PT Goals Patient Stated Goal: to go home PT Goal Formulation: With patient Time For Goal Achievement: 04/02/14 Potential to Achieve Goals: Good Progress towards PT goals: Progressing toward goals    Frequency  Min 3X/week    PT Plan Discharge plan needs to be updated  Co-evaluation             End of Session Equipment Utilized During Treatment: Gait belt;Oxygen Activity Tolerance: Patient limited by fatigue Patient left: in chair;with call bell/phone within reach;with family/visitor present     Time: 7915-0569 PT Time Calculation (min): 24 min  Charges:  $Gait Training: 8-22 mins $Self Care/Home Management: 24-Jul-2023                    G Codes:      Duncan Dull 04-08-14, 9:21 AM Alben Deeds, PT DPT  [phone]

## 2014-03-28 ENCOUNTER — Encounter (HOSPITAL_COMMUNITY): Payer: Self-pay | Admitting: General Practice

## 2014-03-28 LAB — TYPE AND SCREEN
ABO/RH(D): B NEG
Antibody Screen: NEGATIVE
UNIT DIVISION: 0
Unit division: 0
Unit division: 0
Unit division: 0

## 2014-03-28 NOTE — Progress Notes (Signed)
Pt due to void, bladder scan at 2130 revealed 336cc urine, pt requesting to let him try to void on own.  Pt not voided at 2230, bladder scan revealed 473cc.  In and out cath performed using sterile technique, with 300cc urine out.  Post cath scan revealed 112cc urine left.  Will continue to monitor pt.  Call bell within reach.

## 2014-03-28 NOTE — Progress Notes (Signed)
Foley catheter removed per MD order and protocol. Pt instructed to void in urinal when he feels the urge. Will continue to monitor.

## 2014-03-28 NOTE — Progress Notes (Signed)
   VASCULAR PROGRESS NOTE  SUBJECTIVE: No specific complaints.   PHYSICAL EXAM: Filed Vitals:   03/27/14 0810 03/27/14 1451 03/27/14 2008 03/28/14 0505  BP: 129/57 117/47 114/58 113/48  Pulse: 81 76 71 75  Temp: 97.8 F (36.6 C) 97.5 F (36.4 C) 98.3 F (36.8 C) 98.3 F (36.8 C)  TempSrc: Oral Axillary Oral Oral  Resp: 18 18 18 18   Height:      Weight:      SpO2: 98% 99% 95% 95%   Incisons left groin and thigh look good Left foot hyperemic.   LABS: Lab Results  Component Value Date   WBC 16.4* 03/26/2014   HGB 8.7* 03/26/2014   HCT 26.3* 03/26/2014   MCV 87.7 03/26/2014   PLT 198 03/26/2014   Lab Results  Component Value Date   CREATININE 1.15 03/26/2014   Lab Results  Component Value Date   INR 1.15 03/23/2014   PROTIME 20.4* 07/02/2007   CBG (last 3)  No results found for this basename: GLUCAP,  in the last 72 hours  Active Problems:   Critical lower limb ischemia   Atherosclerotic PVD with ulceration   PAD (peripheral artery disease)   ASSESSMENT AND PLAN:  * 4 Days Post-Op s/p: left common femoral artery and external iliac artery endarterectomy with vein patch angioplasty  * D/C foley. He is on flomax.   * Continue PTx  *  Home tomorrow if continues to do well with PTx.  Gae Gallop Beeper: [phone] 03/28/2014

## 2014-03-28 NOTE — Progress Notes (Signed)
Pt ambulated approximately 100 ft in hallway with rolling walker.  Pt tolerated well.  Pt back to chair with wife at bedside.  Call bell within reach.  Will continue to monitor pt closely.

## 2014-03-28 NOTE — Progress Notes (Signed)
Pt ambulated in hallway 100 ft with rolling walker. Pt tolerated activity well. Will continue to monitor.

## 2014-03-29 MED ORDER — OXYCODONE HCL 5 MG PO TABS: 5.0000 mg | ORAL_TABLET | ORAL | Status: DC | PRN

## 2014-03-29 NOTE — Discharge Instructions (Signed)
Continued increased activity slowly. The office will call to arrange a follow up visit in 2-3 weeks. Call sooner if problems. [phone]. You may shower. No driving for 2 weeks.

## 2014-03-29 NOTE — Discharge Summary (Signed)
Patient ID: Timothy [name] MRN: [id number] DOB/AGE: 1928/08/17 80 y.o.  Admit date: 03/22/2014 Discharge date: 03/29/2014  Admission Diagnosis: Critical Limb ischemia  Critical Limb Ischemia Peripheral Vascular Disease  Discharge Diagnoses:  Critical Limb ischemia  Critical Limb Ischemia Peripheral Vascular Disease  Secondary Diagnoses: Past Medical History  Diagnosis Date  . ASCVD (arteriosclerotic cardiovascular disease)   . Anemia     chronic mild anemia  . Gout   . Hypertension   . Lumbar disc disease     post laminectomy  . Chronic back pain   . History of echocardiogram 11/02/2010    EF range of 30 to 35% / There is hypokinsesis of the basal-mild inferolateral myocardium  . CHF (congestive heart failure)   . Heart murmur   . Ischemic cardiomyopathy   . Renal insufficiency   . Peripheral neuropathy   . Inferior myocardial infarction 1986  . Hyperlipidemia   . SOB (shortness of breath)   . Fatigue   . Peripheral neuropathy   . Coronary artery disease     CABG 2002 by Dr. Roxy Manns with LIMA to LAD, SVG to intermediate, SVG to LCX, & SVG to PL. Last nuclear in 2012 showing large inferior scar with EF of 39%.   . Blood transfusion     DEC 2012 - TWO UNITS  . Myeloma     Multiple myeloma, with recurrence of increasing problems  DR. Hurley -ONCOLOGIST.  PT HAS BEEN OFF CHEMO SINCE HIS HOSPITALIZATION FEB 2013 FOR LEFT FOOT INFECTION  . Cancer   . History of shingles MARCH 2009    SHINGLE LESIONS WERE AROUND RIGHT EYE--PT HAS RESIDUAL ITCHING AROUND THE EYE.  . Osteomyelitis     s/p toe amputation  . Chronic systolic heart failure     EF of 30% per echo 02/2012  . Protein malnutrition   . Critical lower limb ischemia     Procedures: 03/22/2014 - 03/24/2014 Surgeon(s): Angelia Mould, MD Procedure(s): Left Common Femoral  Artery Endarterectomy with Vein Patch Angioplasty,  Ultrasound  Left  Discharged Condition: good  HPI: Timothy [name] is a 80 y.o. male  who had presented with a nonhealing wound of his left heel. He was brought in for arteriography by Dr. Quay Burow. This demonstrated a bulky eccentric plaque in the left common femoral artery and vascular surgery was consult for consideration for endarterectomy and patch angioplasty.  The patient does give a history of left thigh claudication which is brought on by ambulation and relieved with rest. He denies any history of rest pain. He fell off of his lawnmower and fractured his right femur in October. He ultimately went to rehabilitation and developed a heel decubitus. This wound has made progress but has not completely healed. In addition, he has undergone previous left great toe amputation and more recently partial amputation of the right second toe. The second toe amputation site is not healed.   Hospital Course: He was taken to the operating room on 03/24/2014 and underwent left common femoral artery endarterectomy with vein patch using left greater saphenous vein. He developed significant postoperative nausea which prolonged his hospital course. In addition he had some issues with urinary retention. He was put back on his Flomax. He did require a Foley catheter temporarily. This was remove yesterday and he has been able to void. His nausea was resolved and he was able to void and felt to be referred to discharge on 03/29/2014.  Consults:   None  Significant Diagnostic Studies:  CBC    Component Value Date/Time   WBC 16.4* 03/26/2014 0329   WBC 6.6 03/11/2014 1057   RBC 3.00* 03/26/2014 0329   RBC 3.12* 03/11/2014 1057   RBC 3.57* 06/28/2011 1103   HGB 8.7* 03/26/2014 0329   HGB 9.0* 03/11/2014 1057   HCT 26.3* 03/26/2014 0329   HCT 27.0* 03/11/2014 1057   PLT 198 03/26/2014 0329   PLT 215 03/11/2014 1057   MCV 87.7 03/26/2014 0329   MCV 86.5 03/11/2014 1057   MCH 29.0 03/26/2014 0329   MCH 28.8 03/11/2014 1057   MCHC 33.1 03/26/2014 0329   MCHC 33.3 03/11/2014 1057   RDW 16.4* 03/26/2014 0329   RDW  15.8* 03/11/2014 1057   LYMPHSABS 1.4 03/25/2014 2115   LYMPHSABS 1.7 03/11/2014 1057   MONOABS 1.4* 03/25/2014 2115   MONOABS 0.6 03/11/2014 1057   EOSABS 0.0 03/25/2014 2115   EOSABS 0.0 03/11/2014 1057   BASOSABS 0.0 03/25/2014 2115   BASOSABS 0.0 03/11/2014 1057    BMET    Component Value Date/Time   NA 130* 03/26/2014 0329   NA 122* 03/11/2014 1058   K 3.8 03/26/2014 0329   K 4.9 03/11/2014 1058   CL 92* 03/26/2014 0329   CL 98 05/12/2013 1048   CO2 22 03/26/2014 0329   CO2 22 03/11/2014 1058   GLUCOSE 142* 03/26/2014 0329   GLUCOSE 132 03/11/2014 1058   GLUCOSE 85 05/12/2013 1048   BUN 22 03/26/2014 0329   BUN 20.0 03/11/2014 1058   CREATININE 1.15 03/26/2014 0329   CREATININE 1.2 03/11/2014 1058   CALCIUM 8.5 03/26/2014 0329   CALCIUM 9.6 03/11/2014 1058   GFRNONAA 57* 03/26/2014 0329   GFRAA 66* 03/26/2014 0329    COAG Lab Results  Component Value Date   INR 1.15 03/23/2014   INR 1.10 03/01/2012   INR 1.09 01/11/2012   PROTIME 20.4* 07/02/2007   PROTIME 13.2 12/05/2006   PROTIME 14.4* 11/29/2006   No results found for this basename: PTT    Disposition: 06-Home-Health Care Svc  Discharge Orders   Future Appointments Provider Department Dept Phone   04/03/2014 2:00 PM Trudie Buckler, [place] [facility]   04/08/2014 10:15 AM Angelia Mould, MD [facility] [phone]   04/15/2014 11:00 AM [facility] [phone]   05/13/2014 11:00 AM [facility] [phone]   05/27/2014 1:30 PM [name], [facility] [phone]   06/08/2014 11:00 AM Burtis Junes, NP [place] [facility] [phone]   06/10/2014 11:00 AM [facility] [phone]   07/15/2014 11:00 AM [facility] [phone]   08/12/2014  11:00 AM [facility] [phone]   Future Orders Complete By Expires   Call MD for:  redness, tenderness, or signs of infection (pain, swelling, bleeding, redness, odor or green/yellow discharge around incision site)  As directed    Call MD for:  severe or increased pain, loss or decreased feeling  in affected limb(s)  As directed    Call MD for:  temperature >100.5  As directed    Discharge wound care:  As directed    Driving Restrictions  As directed    Lifting restrictions  As directed    Resume previous diet  As directed  Medication List         allopurinol 300 MG tablet  Commonly known as:  ZYLOPRIM  Take 0.5 tablets (150 mg total) by mouth daily.     aspirin EC 81 MG tablet  Take 81 mg by mouth 3 (three) times a week. Monday, Wednesday, Friday     atorvastatin 10 MG tablet  Commonly known as:  LIPITOR  Take 10 mg by mouth every evening.     B COMPLETE PO  Take 1 tablet by mouth daily.     carvedilol 6.25 MG tablet  Commonly known as:  COREG  Take 6.25 mg by mouth 2 (two) times daily with a meal.     CO Q-10 PO  Take 1 tablet by mouth every morning.     DULoxetine 60 MG capsule  Commonly known as:  CYMBALTA  Take 2 capsules (120 mg total) by mouth every evening.     HYDROmorphone 4 MG tablet  Commonly known as:  DILAUDID  Take 6 mg by mouth every 6 (six) hours as needed for severe pain.     lisinopril 20 MG tablet  Commonly known as:  PRINIVIL,ZESTRIL  Take 20 mg by mouth daily.     multivitamin capsule  Take 1 capsule by mouth every morning.     nitroGLYCERIN 0.4 MG SL tablet  Commonly known as:  NITROSTAT  Place 0.4 mg under the tongue every 5 (five) minutes as needed for chest pain.     oxyCODONE 5 MG immediate release tablet  Commonly known as:  Oxy IR/ROXICODONE  Take 1-2 tablets (5-10 mg total) by mouth every 4 (four) hours as needed for moderate pain.     polyethylene glycol packet   Commonly known as:  MIRALAX / GLYCOLAX  Take 17 g by mouth daily as needed.     rOPINIRole 0.5 MG tablet  Commonly known as:  REQUIP  Take 3 mg by mouth at bedtime.     tamsulosin 0.4 MG Caps capsule  Commonly known as:  FLOMAX  Take 0.4 mg by mouth at bedtime.     tobramycin 0.3 % ophthalmic solution  Commonly known as:  TOBREX  Place 1 drop into both eyes daily as needed (for Bacterial eye infection).     vitamin C 500 MG tablet  Commonly known as:  ASCORBIC ACID  Take 500 mg by mouth every morning.           Follow-up Information   Follow up with DICKSON,CHRISTOPHER S, MD In 2 weeks. (Office will call you to arrange your appt (sent))    Specialty:  Vascular Surgery   Contact information:   [address] [place] [address] [phone]       Follow up with [facility]. (home health physical therapy)    Contact information:   [address] [place] [address] [phone]       Signed: Angelia Mould 03/29/2014, 9:32 AM

## 2014-03-29 NOTE — Progress Notes (Signed)
Pt discharged per MD order and protocol. Discharge instructions reviewed with patient and wife, all questions answered. Pt aware of all follow up appointments. Pt given all prescriptions.

## 2014-03-29 NOTE — Progress Notes (Signed)
   VASCULAR PROGRESS NOTE  SUBJECTIVE: No complaints. Once in the home.  PHYSICAL EXAM: Filed Vitals:   03/28/14 0505 03/28/14 1345 03/28/14 2140 03/29/14 0441  BP: 113/48 112/50 103/48 127/52  Pulse: 75 73 68 72  Temp: 98.3 F (36.8 C) 98.8 F (37.1 C) 98.5 F (36.9 C) 97.5 F (36.4 C)  TempSrc: Oral Oral Oral Oral  Resp: 18 18 20 18   Height:      Weight:      SpO2: 95% 100% 100% 99%   Left foot warm and hyperemic. Incisions look fine.  LABS: Lab Results  Component Value Date   WBC 16.4* 03/26/2014   HGB 8.7* 03/26/2014   HCT 26.3* 03/26/2014   MCV 87.7 03/26/2014   PLT 198 03/26/2014   Lab Results  Component Value Date   CREATININE 1.15 03/26/2014   Lab Results  Component Value Date   INR 1.15 03/23/2014   PROTIME 20.4* 07/02/2007   CBG (last 3)  No results found for this basename: GLUCAP,  in the last 72 hours  Active Problems:   Critical lower limb ischemia   Atherosclerotic PVD with ulceration   PAD (peripheral artery disease)   ASSESSMENT AND PLAN:  * 5 Days Post-Op s/p: left common femoral artery and external iliac artery endarterectomy with vein patch angioplasty   * He has been able to void.    * Discharge today.    Gae Gallop Beeper: [phone] 03/29/2014

## 2014-04-01 ENCOUNTER — Ambulatory Visit: Payer: Medicare Other | Admitting: Podiatrist

## 2014-04-03 ENCOUNTER — Ambulatory Visit (INDEPENDENT_AMBULATORY_CARE_PROVIDER_SITE_OTHER): Payer: Medicare Other | Admitting: Podiatrist

## 2014-04-03 ENCOUNTER — Encounter: Payer: Self-pay | Admitting: Podiatrist

## 2014-04-03 VITALS — BP 146/63 | HR 63 | Temp 98.2°F | Resp 16

## 2014-04-03 DIAGNOSIS — M869 Osteomyelitis, unspecified: Secondary | ICD-10-CM

## 2014-04-03 DIAGNOSIS — L97509 Non-pressure chronic ulcer of other part of unspecified foot with unspecified severity: Secondary | ICD-10-CM

## 2014-04-03 DIAGNOSIS — L97529 Non-pressure chronic ulcer of other part of left foot with unspecified severity: Secondary | ICD-10-CM

## 2014-04-03 DIAGNOSIS — I2510 Atherosclerotic heart disease of native coronary artery without angina pectoris: Secondary | ICD-10-CM

## 2014-04-03 MED ORDER — ROPINIROLE HCL 3 MG PO TABS: 3.0000 mg | ORAL_TABLET | Freq: Every day | ORAL | Status: DC

## 2014-04-03 MED ORDER — ROPINIROLE HCL 0.5 MG PO TABS: 3.0000 mg | ORAL_TABLET | Freq: Every day | ORAL | Status: DC

## 2014-04-03 NOTE — Progress Notes (Signed)
Subjective: Dr. Aram Candela presents today for followup of left foot. Timothy [name] recently was released from the hospital following a bypass performed by Dr. Scot Dock. Timothy [name] is very pleased with his result and states that now Timothy [name] is having some mild cramping of the thigh and calf from the blood flow starting to return to an area where there was none for so long. Timothy [name] states that Timothy [name] is happy that his foot is improving and the ulcer on the heel is closing quickly. Timothy [name] still has exposed bone on the left second toe and is here to discuss having it removed.  Objective: Excellent appearance of the left foot is noted in color, texture and turgor. Much improved from the previous visit. Pedal pulses still unable to be palpated clinically however Timothy [name] did have a femoropopliteal bypass according to the anterior gram photos Timothy [name] brought in to show me today. It appears that blood flow was restored to the foot excellently. Capillary refill time is immediate and patient sensation is greatly improved from prior to surgery. The head of the proximal phalanx is still exposed in the wound there is no redness, no swelling, no sign of infection around the bone however it's exposure is concerning for osteomyelitis. The heel ulcer is greatly improved and is now a very shallow callus. Overall significant improvement has occurred since revascularization.  Assessment: Osteomyelitis with exposed bone proximal phalanx left second toe  Plan: We are going to remove the remainder of the proximal phalanx of the left second toe. We did go over the consent form and we will do this here in the office under local anesthesia. No tourniquet will be used. Timothy [name] will have to keep the foot dressing clean, dry and intact after surgery.  We will plan to do the procedure in 2 weeks. I did also change his Requip to 3 mg by mouth at bedtime. Timothy [name] states that the medication has been helping significantly with his restless leg syndrome. Timothy [name] will let me know if Timothy [name] needs a refill

## 2014-04-06 ENCOUNTER — Encounter: Payer: Self-pay | Admitting: Oncology

## 2014-04-07 ENCOUNTER — Encounter: Payer: Self-pay | Admitting: Vascular Surgery

## 2014-04-07 ENCOUNTER — Telehealth: Payer: Self-pay | Admitting: Oncology

## 2014-04-07 ENCOUNTER — Other Ambulatory Visit: Payer: Self-pay | Admitting: *Deleted

## 2014-04-07 DIAGNOSIS — C9000 Multiple myeloma not having achieved remission: Secondary | ICD-10-CM

## 2014-04-07 DIAGNOSIS — D649 Anemia, unspecified: Secondary | ICD-10-CM

## 2014-04-07 NOTE — Telephone Encounter (Signed)
S/w the pt and he is aware of his may appts.

## 2014-04-07 NOTE — Telephone Encounter (Signed)
Talked with pt & he states that he had been in the hosp for 1 wk @ 2 wks ago with blocked femeral artery & had surgery.  He states he feels like he is going down hill & would like to be seen earlier than scheduled.  Discussed with Amy Alroy Dust RN & informed pt to come in fri for labs @ 11 am & to see Dr [name] @ 1130am & we will set up for blood transfusion if needed after MD visit.  POF to schedulers

## 2014-04-08 ENCOUNTER — Ambulatory Visit (INDEPENDENT_AMBULATORY_CARE_PROVIDER_SITE_OTHER): Payer: Self-pay | Admitting: Vascular Surgery

## 2014-04-08 ENCOUNTER — Encounter: Payer: Self-pay | Admitting: Vascular Surgery

## 2014-04-08 VITALS — BP 135/59 | HR 67 | Temp 98.2°F | Ht 70.0 in | Wt 186.0 lb

## 2014-04-08 DIAGNOSIS — I739 Peripheral vascular disease, unspecified: Secondary | ICD-10-CM | POA: Insufficient documentation

## 2014-04-08 NOTE — Progress Notes (Signed)
   Patient name: Timothy [name] MRN: [id number] DOB: Apr 14, 1928 Sex: male  REASON FOR VISIT: Follow up after left common femoral artery endarterectomy  HPI: Timothy [name] is a 80 y.o. male who presented with a nonhealing wound of his left foot. He underwent an arteriogram and was found to have an eccentric calcified plaque in the left common femoral artery. He underwent left common femoral artery endarterectomy with vein patch angioplasty using left greater saphenous vein on 03/24/14. He comes in for his first outpatient visit.  Overall he been doing well. He does describe some claudication in his left calf and thigh but his symptoms have improved. His podiatrist is following his open wound on the second toe. He denies fever or chills.  REVIEW OF SYSTEMS: Valu.Nieves ] denotes positive finding; [  ] denotes negative finding  CARDIOVASCULAR:  [ ]  chest pain   [ ]  dyspnea on exertion    CONSTITUTIONAL:  [ ]  fever   [ ]  chills  PHYSICAL EXAM: Filed Vitals:   04/08/14 1026  BP: 135/59  Pulse: 67  Temp: 98.2 F (36.8 C)  TempSrc: Oral  Height: 5\' 10"  (1.778 m)  Weight: 186 lb (84.369 kg)  SpO2: 100%   Body mass index is 26.69 kg/(m^2). GENERAL: The patient is a well-nourished male, in no acute distress. The vital signs are documented above. CARDIOVASCULAR: There is a regular rate and rhythm. PULMONARY: There is good air exchange bilaterally without wheezing or rales. His incisions in the left groin and left thigh are healing nicely. Has brisk Doppler signals in the dorsalis pedis and posterior tibial positions on the left foot. The toe wound is stable.  MEDICAL ISSUES: The patient is doing well status post left common femoral artery endarterectomy and vein patch angioplasty. He does have known severe tibial artery occlusive disease. He also has some moderate disease in the left common iliac artery but no focal stenosis. I've encouraged him to stay as active as possible. Fortunately he is not a  smoker. Dr. Gwenlyn Found will do his follow up Doppler studies. I'll see him back in 6 months. He knows to call sooner if he has problems.   Angelia Mould [facility] Beeper: [phone]

## 2014-04-09 ENCOUNTER — Encounter: Payer: Self-pay | Admitting: Podiatrist

## 2014-04-09 ENCOUNTER — Telehealth (HOSPITAL_COMMUNITY): Payer: Self-pay | Admitting: *Deleted

## 2014-04-09 ENCOUNTER — Telehealth: Payer: Self-pay | Admitting: Cardiovascular Disease

## 2014-04-09 NOTE — Telephone Encounter (Signed)
Closed encounter °

## 2014-04-10 ENCOUNTER — Ambulatory Visit (HOSPITAL_COMMUNITY)
Admission: RE | Admit: 2014-04-10 | Discharge: 2014-04-10 | Disposition: A | Discharge status: Home / Self Care | Payer: Medicare Other | Source: Ambulatory Visit | Attending: Oncology | Admitting: Oncology

## 2014-04-10 ENCOUNTER — Ambulatory Visit: Payer: Medicare Other

## 2014-04-10 ENCOUNTER — Other Ambulatory Visit: Payer: Self-pay | Admitting: *Deleted

## 2014-04-10 ENCOUNTER — Telehealth (HOSPITAL_COMMUNITY): Payer: Self-pay | Admitting: *Deleted

## 2014-04-10 ENCOUNTER — Other Ambulatory Visit (HOSPITAL_BASED_OUTPATIENT_CLINIC_OR_DEPARTMENT_OTHER): Payer: Medicare Other

## 2014-04-10 ENCOUNTER — Telehealth: Payer: Self-pay | Admitting: *Deleted

## 2014-04-10 ENCOUNTER — Ambulatory Visit (HOSPITAL_BASED_OUTPATIENT_CLINIC_OR_DEPARTMENT_OTHER): Payer: Medicare Other | Admitting: Oncology

## 2014-04-10 VITALS — BP 144/74 | HR 62 | Temp 97.7°F | Resp 18

## 2014-04-10 VITALS — BP 121/63 | HR 85 | Temp 97.8°F | Resp 18 | Ht 70.0 in | Wt 185.1 lb

## 2014-04-10 DIAGNOSIS — D649 Anemia, unspecified: Secondary | ICD-10-CM | POA: Insufficient documentation

## 2014-04-10 DIAGNOSIS — D631 Anemia in chronic kidney disease: Secondary | ICD-10-CM

## 2014-04-10 DIAGNOSIS — I739 Peripheral vascular disease, unspecified: Secondary | ICD-10-CM

## 2014-04-10 DIAGNOSIS — N039 Chronic nephritic syndrome with unspecified morphologic changes: Principal | ICD-10-CM

## 2014-04-10 DIAGNOSIS — C9000 Multiple myeloma not having achieved remission: Secondary | ICD-10-CM

## 2014-04-10 DIAGNOSIS — M869 Osteomyelitis, unspecified: Secondary | ICD-10-CM

## 2014-04-10 DIAGNOSIS — M79609 Pain in unspecified limb: Secondary | ICD-10-CM

## 2014-04-10 DIAGNOSIS — N289 Disorder of kidney and ureter, unspecified: Secondary | ICD-10-CM

## 2014-04-10 DIAGNOSIS — I70209 Unspecified atherosclerosis of native arteries of extremities, unspecified extremity: Secondary | ICD-10-CM

## 2014-04-10 DIAGNOSIS — M7989 Other specified soft tissue disorders: Secondary | ICD-10-CM

## 2014-04-10 DIAGNOSIS — L98499 Non-pressure chronic ulcer of skin of other sites with unspecified severity: Secondary | ICD-10-CM

## 2014-04-10 DIAGNOSIS — D638 Anemia in other chronic diseases classified elsewhere: Secondary | ICD-10-CM

## 2014-04-10 DIAGNOSIS — R5383 Other fatigue: Secondary | ICD-10-CM

## 2014-04-10 DIAGNOSIS — D630 Anemia in neoplastic disease: Secondary | ICD-10-CM | POA: Insufficient documentation

## 2014-04-10 DIAGNOSIS — R5381 Other malaise: Secondary | ICD-10-CM

## 2014-04-10 DIAGNOSIS — G5790 Unspecified mononeuropathy of unspecified lower limb: Secondary | ICD-10-CM

## 2014-04-10 LAB — COMPREHENSIVE METABOLIC PANEL (CC13)
ALT: 12 U/L (ref 0–55)
ANION GAP: 10 meq/L (ref 3–11)
AST: 19 U/L (ref 5–34)
Albumin: 3.2 g/dL — ABNORMAL LOW (ref 3.5–5.0)
Alkaline Phosphatase: 45 U/L (ref 40–150)
BUN: 12.8 mg/dL (ref 7.0–26.0)
CO2: 22 meq/L (ref 22–29)
CREATININE: 1.2 mg/dL (ref 0.7–1.3)
Calcium: 8.9 mg/dL (ref 8.4–10.4)
Chloride: 101 mEq/L (ref 98–109)
GLUCOSE: 111 mg/dL (ref 70–140)
Potassium: 4.4 mEq/L (ref 3.5–5.1)
Sodium: 132 mEq/L — ABNORMAL LOW (ref 136–145)
Total Bilirubin: 0.55 mg/dL (ref 0.20–1.20)
Total Protein: 6.7 g/dL (ref 6.4–8.3)

## 2014-04-10 LAB — CBC WITH DIFFERENTIAL/PLATELET
BASO%: 1.1 % (ref 0.0–2.0)
Basophils Absolute: 0.1 10*3/uL (ref 0.0–0.1)
EOS%: 2 % (ref 0.0–7.0)
Eosinophils Absolute: 0.1 10*3/uL (ref 0.0–0.5)
HCT: 22.4 % — ABNORMAL LOW (ref 38.4–49.9)
HGB: 7.3 g/dL — ABNORMAL LOW (ref 13.0–17.1)
LYMPH%: 24.9 % (ref 14.0–49.0)
MCH: 29.4 pg (ref 27.2–33.4)
MCHC: 32.7 g/dL (ref 32.0–36.0)
MCV: 89.9 fL (ref 79.3–98.0)
MONO#: 0.8 10*3/uL (ref 0.1–0.9)
MONO%: 10.8 % (ref 0.0–14.0)
NEUT#: 4.4 10*3/uL (ref 1.5–6.5)
NEUT%: 61.2 % (ref 39.0–75.0)
Platelets: 314 10*3/uL (ref 140–400)
RBC: 2.49 10*6/uL — ABNORMAL LOW (ref 4.20–5.82)
RDW: 17.5 % — ABNORMAL HIGH (ref 11.0–14.6)
WBC: 7.2 10*3/uL (ref 4.0–10.3)
lymph#: 1.8 10*3/uL (ref 0.9–3.3)

## 2014-04-10 LAB — HOLD TUBE, BLOOD BANK

## 2014-04-10 LAB — PREPARE RBC (CROSSMATCH)

## 2014-04-10 LAB — FERRITIN CHCC: Ferritin: 247 ng/ml (ref 22–316)

## 2014-04-10 MED ORDER — DIPHENHYDRAMINE HCL 25 MG PO CAPS
ORAL_CAPSULE | ORAL | Status: AC
  Filled 2014-04-10: qty 1

## 2014-04-10 MED ORDER — ACETAMINOPHEN 325 MG PO TABS
ORAL_TABLET | ORAL | Status: AC
  Filled 2014-04-10: qty 2

## 2014-04-10 MED ORDER — ACETAMINOPHEN 325 MG PO TABS
650.0000 mg | ORAL_TABLET | Freq: Once | ORAL | Status: AC
  Administered 2014-04-10: 650 mg via ORAL

## 2014-04-10 MED ORDER — DIPHENHYDRAMINE HCL 25 MG PO CAPS
25.0000 mg | ORAL_CAPSULE | Freq: Once | ORAL | Status: AC
  Administered 2014-04-10: 25 mg via ORAL

## 2014-04-10 NOTE — Telephone Encounter (Signed)
I really recommend removing the entire bone-- the problem with just cleaning it up is that you end up leaving bone in there that could still have infection in it and if you close it over, it could cause an abscess and spread even further down in the foot.  It won't make any difference in the function of the foot-- just the area will be flush with the skin there already from the previous hallux amputation.

## 2014-04-10 NOTE — Patient Instructions (Signed)
Blood Transfusion Information WHAT IS A BLOOD TRANSFUSION? A transfusion is the replacement of blood or some of its parts. Blood is made up of multiple cells which provide different functions.  Red blood cells carry oxygen and are used for blood loss replacement.  White blood cells fight against infection.  Platelets control bleeding.  Plasma helps clot blood.  Other blood products are available for specialized needs, such as hemophilia or other clotting disorders. BEFORE THE TRANSFUSION  Who gives blood for transfusions?   You may be able to donate blood to be used at a later date on yourself (autologous donation).  Relatives can be asked to donate blood. This is generally not any safer than if you have received blood from a stranger. The same precautions are taken to ensure safety when a relative's blood is donated.  Healthy volunteers who are fully evaluated to make sure their blood is safe. This is blood bank blood. Transfusion therapy is the safest it has ever been in the practice of medicine. Before blood is taken from a donor, a complete history is taken to make sure that person has no history of diseases nor engages in risky social behavior (examples are intravenous drug use or sexual activity with multiple partners). The donor's travel history is screened to minimize risk of transmitting infections, such as malaria. The donated blood is tested for signs of infectious diseases, such as HIV and hepatitis. The blood is then tested to be sure it is compatible with you in order to minimize the chance of a transfusion reaction. If you or a relative donates blood, this is often done in anticipation of surgery and is not appropriate for emergency situations. It takes many days to process the donated blood. RISKS AND COMPLICATIONS Although transfusion therapy is very safe and saves many lives, the main dangers of transfusion include:   Getting an infectious disease.  Developing a  transfusion reaction. This is an allergic reaction to something in the blood you were given. Every precaution is taken to prevent this. The decision to have a blood transfusion has been considered carefully by your caregiver before blood is given. Blood is not given unless the benefits outweigh the risks. AFTER THE TRANSFUSION  Right after receiving a blood transfusion, you will usually feel much better and more energetic. This is especially true if your red blood cells have gotten low (anemic). The transfusion raises the level of the red blood cells which carry oxygen, and this usually causes an energy increase.  The nurse administering the transfusion will monitor you carefully for complications. HOME CARE INSTRUCTIONS  No special instructions are needed after a transfusion. You may find your energy is better. Speak with your caregiver about any limitations on activity for underlying diseases you may have. SEEK MEDICAL CARE IF:   Your condition is not improving after your transfusion.  You develop redness or irritation at the intravenous (IV) site. SEEK IMMEDIATE MEDICAL CARE IF:  Any of the following symptoms occur over the next 12 hours:  Shaking chills.  You have a temperature by mouth above 102 F (38.9 C), not controlled by medicine.  Chest, back, or muscle pain.  People around you feel you are not acting correctly or are confused.  Shortness of breath or difficulty breathing.  Dizziness and fainting.  You get a rash or develop hives.  You have a decrease in urine output.  Your urine turns a dark color or changes to pink, red, or brown. Any of the following   symptoms occur over the next 10 days:  You have a temperature by mouth above 102 F (38.9 C), not controlled by medicine.  Shortness of breath.  Weakness after normal activity.  The white part of the eye turns yellow (jaundice).  You have a decrease in the amount of urine or are urinating less often.  Your  urine turns a dark color or changes to pink, red, or brown. Document Released: 11/03/2000 Document Revised: 01/29/2012 Document Reviewed: 06/22/2008 [employer] Patient Information 2014 [employer].  

## 2014-04-10 NOTE — Telephone Encounter (Signed)
I called and informed him that Dr. Valentina Lucks said she doesn't recommend that because infection could still be in the bone and it could spread to another part of the foot.  He said he understood and will see her on Wednesday.

## 2014-04-10 NOTE — Telephone Encounter (Signed)
He'd like to speak to Dr. Valentina Lucks before he has his procedure next Wednesday.  I returned his call.  He stated, maybe be we can not take that 3rd phalanx out but just clean it up, debride it and close it up.  Please ask her to think about that and let me know.  I told him I would get the message to her and call back with a response.

## 2014-04-11 LAB — PROTEIN / CREATININE RATIO, URINE
Creatinine, Urine: 25.9 mg/dL
Protein Creatinine Ratio: 0.19 — ABNORMAL HIGH (ref <0.15)
TOTAL PROTEIN, URINE: 5 mg/dL

## 2014-04-11 NOTE — Progress Notes (Signed)
ID: Timothy [name]   DOB: 1928-01-03  MR#: [id number]  YYT#:[id number]  PCP:  SU: OTHER MD: Timothy [name], Timothy [name], Timothy [name]  Cc: multiple myeloma/ under observation  MYELOMA HISTORY: Patient presented in April 2007 with symptomatic slowly progressive anemia, with normal iron parameters, folic acid, and vitamin B12. Plans were made to obtain bone marrow aspirate and biopsy as an outpatient, however the patient was hospitalized shortly thereafter with a creatinine of 8.6. Renal biopsy demonstrated the presence of  light chain deposition disease. Bone marrow biopsy demonstrated 37% plasma cells. A skeletal survey showed small calvarial lytic lesions, mild osteopenia, and cervical spondylosis. With a well-established diagnosis of light-chain multiple myeloma Dr. Melodie [name] subsequent treatments are as detailed below.   INTERVAL HISTORY: Timothy [name] returns today with his wife, Timothy [name], for followup of his multiple myeloma. Since the last visit here he was found to have critical limb ischemia of the left leg, with multilevel arteriolar occlusive disease. This was not felt to be amenable to stenting and therefore on 03/24/2014 he underwent left common femoral artery endarterectomy with vein patch angioplasty using the left greater saphenous vein. He spent approximately a week in the hospital which she says "was held". He had postoperative nausea, urinary retention requiring a temporary Foley, and of course issues regarding pain and mobility. He is now back home but "can't get anything done". According to his wife he is more than 50% of the time in bed or in a recliner. Timothy [name] though is very encouraged because he can now walk from one another house to the other. He is pretty much staying inside, no doing her gardening much less any golf, those being his 2 main recreational activities previously  REVIEW OF SYSTEMS: Timothy [name] complains of pain in  his left leg, but "it's getting better". He is using a lot less dilated than before and hasn't taken any in 2 or 3 days, he says. He doesn't want to use Tylenol when necessary for the pain. He feels very fatigued. He is trying to get away from the walker. His feet swell. You short of breath even at rest sometimes. Some of these symptoms are due to be related to his anemia, rather than his myeloma or recent surgery. There has been no bleeding, no unusual headaches, his nausea and vomiting have completely resolved, and he no longer has urinary retention problems. His neuropathy is "the same". A detailed review of systems today was otherwise stable  PAST MEDICAL HISTORY: Past Medical History  Diagnosis Date  . ASCVD (arteriosclerotic cardiovascular disease)   . Anemia     chronic mild anemia  . Gout   . Hypertension   . Lumbar disc disease     post laminectomy  . Chronic back pain   . History of echocardiogram 11/02/2010    EF range of 30 to 35% / There is hypokinsesis of the basal-mild inferolateral myocardium  . CHF (congestive heart failure)   . Heart murmur   . Ischemic cardiomyopathy   . Renal insufficiency   . Peripheral neuropathy   . Inferior myocardial infarction 1986  . Hyperlipidemia   . SOB (shortness of breath)   . Fatigue   . Peripheral neuropathy   . Coronary artery disease     CABG 2002 by Dr. Roxy [name] with LIMA to LAD, SVG to intermediate, SVG to LCX, & SVG to PL. Last nuclear in 2012 showing large inferior scar with EF of 39%.   Marland Kitchen  Blood transfusion     DEC 2012 - TWO UNITS  . Myeloma     Multiple myeloma, with recurrence of increasing problems  DR. Baldwin -ONCOLOGIST.  PT HAS BEEN OFF CHEMO SINCE HIS HOSPITALIZATION FEB 2013 FOR LEFT FOOT INFECTION  . Cancer   . History of shingles MARCH 2009    SHINGLE LESIONS WERE AROUND RIGHT EYE--PT HAS RESIDUAL ITCHING AROUND THE EYE.  . Osteomyelitis     s/p toe amputation  . Chronic systolic heart failure     EF of 30% per  echo 02/2012  . Protein malnutrition   . Critical lower limb ischemia    PAST SURGICAL HISTORY: Past Surgical History  Procedure Laterality Date  . Laminectomy    . Inguinal hernia repair    . Cardiac catheterization  06/20/2000    Severe coronary disease (totally occluded right artery, 90% left circumflex, 50-60% intermediate, and 90-95% ostial left anterior descending)   . Tonsillectomy    . Coronary artery bypass graft  2002    x4 / Left internal mammary artery to the LAD.  / Saphenous vein graft to the right posterolateral.  /  Saphenous vein graft to  the ramus intermedius. / Saphenous vein graft to the circumflex marginal     . Amputation  03/06/2012    Procedure: AMPUTATION RAY;  Surgeon: Alta Corning, MD;  Location: [facility];  Service: Orthopedics;  Laterality: Left;  First Ray Amputation  . Intramedullary (im) nail intertrochanteric Right 08/17/2013    Procedure: INTRAMEDULLARY (IM) NAIL INTERTROCHANTRIC;  Surgeon: Marybelle Killings, MD;  Location: [facility];  Service: Orthopedics;  Laterality: Right;  . 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 He and his wife Timothy [name]. the only once at home.    ADVANCED DIRECTIVES:  HEALTH MAINTENANCE: History  Substance Use Topics  . Smoking status: Former Smoker -- 1.00 packs/day for 20 years    Types: Cigarettes    Quit date: 11/20/1984  . Smokeless tobacco: Never Used  . Alcohol Use: No     Colonoscopy:  Bone density:  Lipid panel:  Allergies  Allergen Reactions  . Morphine And Related Other (See Comments)    Agitated with morphine drip     Current Outpatient Prescriptions  Medication Sig Dispense Refill  . allopurinol (ZYLOPRIM) 300 MG tablet Take 0.5 tablets (150 mg total) by mouth daily.  90 tablet  0  . aspirin EC 81 MG tablet Take 81 mg by mouth 3 (three) times a week. Monday, Wednesday, Friday      . atorvastatin (LIPITOR) 10 MG tablet Take 10 mg by mouth every evening.      . B Complex-Biotin-FA (B COMPLETE PO) Take 1 tablet by mouth daily.       . carvedilol (COREG) 6.25 MG tablet Take 6.25 mg by mouth 2 (two) times daily with a meal.      . Coenzyme Q10 (CO Q-10 PO) Take 1 tablet by mouth every morning.       . DULoxetine (CYMBALTA) 60 MG capsule Take 2 capsules (120 mg total) by mouth every evening.  180 capsule  1  . HYDROmorphone (DILAUDID)  4 MG tablet Take 6 mg by mouth every 6 (six) hours as needed for severe pain.      Marland Kitchen lisinopril (PRINIVIL,ZESTRIL) 20 MG tablet Take 20 mg by mouth daily.      . Multiple Vitamin (MULTIVITAMIN) capsule Take 1 capsule by mouth every morning.       . nitroGLYCERIN (NITROSTAT) 0.4 MG SL tablet Place 0.4 mg under the tongue every 5 (five) minutes as needed for chest pain.      Marland Kitchen oxyCODONE (OXY IR/ROXICODONE) 5 MG immediate release tablet Take 1-2 tablets (5-10 mg total) by mouth every 4 (four) hours as needed for moderate pain.  30 tablet  0  . polyethylene glycol (MIRALAX / GLYCOLAX) packet Take 17 g by mouth daily as needed.  14 each  0  . rOPINIRole (REQUIP) 0.5 MG tablet Take 6 tablets (3 mg total) by mouth at bedtime.  30 tablet  4  . rOPINIRole (REQUIP) 3 MG tablet Take 1 tablet (3 mg total) by mouth at bedtime.  30 tablet  0  . rOPINIRole (REQUIP) 3 MG tablet Take 1 tablet (3 mg total) by mouth at bedtime.  30 tablet  0  . tamsulosin (FLOMAX) 0.4 MG CAPS capsule Take 0.4 mg by mouth at bedtime.      Marland Kitchen tobramycin (TOBREX) 0.3 % ophthalmic solution Place 1 drop into both eyes daily as needed (for Bacterial eye infection).       . vitamin C (ASCORBIC ACID) 500 MG tablet Take 500  mg by mouth every morning.       No current facility-administered medications for this visit.    Objective: Older white male who appears stated age 12 Vitals:   04/10/14 1148  BP: 121/63  Pulse: 85  Temp: 97.8 F (36.6 C)  Resp: 18        Body mass index is 26.56 kg/(m^2).    ECOG FS: 3 Filed Weights   04/10/14 1148  Weight: 185 lb 1.6 oz (83.961 kg)   Sclerae unicteric, EOMs intact Oropharynx clear and moist No peripheral adenopathy Lungs clear bilaterally  Heart regular rate and rhythm, no murmur appreciated Abdomen soft, nontender, positive bowel sounds MSK did not uncover the left foot; bilateral ankle edema, 1+ Neuro: nonfocal, well oriented, positive affect   LAB RESULTS:  Ferritin today is 247; reticulocyte side count in myeloma labs are pending at the time of this dictation  Results for Timothy [name], Timothy [name] (MRN [id number]) as of 04/11/2014 08:23  Ref. Range 10/29/2013 10:55 11/26/2013 11:21 12/31/2013 11:05 02/04/2014 11:13 03/11/2014 10:58  Kappa free light chain Latest Range: 0.33-1.94 mg/dL 49.00 (H) 55.80 (H) 43.00 (H) 69.50 (H) 71.00 (H)     Lab Results  Component Value Date   WBC 7.2 04/10/2014   NEUTROABS 4.4 04/10/2014   HGB 7.3* 04/10/2014   HCT 22.4* 04/10/2014   MCV 89.9 04/10/2014   PLT 314 04/10/2014      Chemistry      Component Value Date/Time   NA 132* 04/10/2014 1109   NA 130* 03/26/2014 0329   K 4.4 04/10/2014 1109   K 3.8 03/26/2014 0329   CL 92* 03/26/2014 0329   CL 98 05/12/2013 1048   CO2 22 04/10/2014 1109   CO2 22 03/26/2014 0329   BUN 12.8 04/10/2014 1109   BUN 22 03/26/2014 0329   CREATININE 1.2 04/10/2014 1109   CREATININE 1.15 03/26/2014 0329      Component Value Date/Time   CALCIUM 8.9 04/10/2014 1109   CALCIUM  8.5 03/26/2014 0329   ALKPHOS 45 04/10/2014 1109   ALKPHOS 42 03/25/2014 2115   AST 19 04/10/2014 1109   AST 25 03/25/2014 2115   ALT 12 04/10/2014 1109   ALT 10 03/25/2014 2115   BILITOT 0.55 04/10/2014 1109   BILITOT 0.7 03/25/2014 2115       STUDIES: No results found. Most recent bone survey, April 20 15th, showed no lytic lesions associated with his myeloma  ASSESSMENT: 80 y.o.  with kappa light chain multiple myeloma   (1) presenting April 2007 with anemia and renal failure; renal Bx showing free kappa light chain deposition; bone marrow biopsy showing 37% plasma cells, with normal cytogenetics and FISH;bone survey showing skull lytic lesions; treated with  (2) thalidomide (200 mg/d) and dexamethasone (40 mg/d x4d Q28d) June through Nov 2007, with good response, but poor tolerance;  (3) thalidomide decreased to 100 mg/d, dexamethasone continued, bortezomib (IV) added, Dec 2007 to March 2008  (4) treatment interrupted by multiple complications (peripheral neuropathy, pulmonary embolism, diverticular abscess, CN V zoster, severe DDD, congestive heart failure, rising PSA); maintenance zolendronic acid through March 2012  (5) progression April 2012, treated initially with dexamethasone alone, poorly tolerated;  (6) bortezomib (sq) resumed July 2012; cyclophosphamide and dexamethasone added Sept 2012; zolendronic acid changed to Q 99month; all treatments held as of March 2013 due to the development of the Left foot ulcer described above (7) anemia, on aranesp December 2012 to August 2013; resumed May 2015 (8) status post Right trochanteric nail with proximal and distal interlock  DePuy 11 mm one-pin lag screw, 44 mm distal interlock 08/09/2013 for a right comminuted intertrochanteric fracture (9) status post removal of the first 2 toes left foot (10) Left common femoral artery endarterectomy with vein patch angioplasty using left greater saphenous vein 03/24/2014  PLAN:   Takes kappa light chains appear to be inching up slightly. He is in no condition to undergo treatment of his myeloma at this point, but once he is more stable we can discuss some of the new modalities available, which may be effective at controlling his disease and  may not worsen his peripheral neuropathy.  Even though he is still having pain, the patient appears significantly improve postop. It could be that a lot of when he was feeling was not really neuropathy, but vascular insufficiency. If so that will give u[place]oreamore options regarding his myeloma treatment.  He is significantly anemic. This is going to be multifactorial, but one likely cause is myeloma involvement of his marrow. We're going to transfuse him (he is short of breath and excessively fatigued) and started Aranesp. We will follow his reticulocyte count. His ferritin is adequate.  We're going to continue to follow his lab work on an every 4 week basis. She will see me again in July. If his hemoglobin and vascular insufficiency problems have improved, we will consider resumption of myeloma treatment  DBarbarann Ehlersis a good understanding of the overall plan. He agrees with it. He knows a goal of treatment in his case is control. He will call with any problems that may develop before his next visit here.    GVirgie DadMagrinat    04/11/2014

## 2014-04-12 LAB — TYPE AND SCREEN
ABO/RH(D): B NEG
ANTIBODY SCREEN: NEGATIVE
Unit division: 0
Unit division: 0

## 2014-04-13 ENCOUNTER — Encounter: Payer: Self-pay | Admitting: Oncology

## 2014-04-14 LAB — KAPPA/LAMBDA LIGHT CHAINS
Kappa free light chain: 49.1 mg/dL — ABNORMAL HIGH (ref 0.33–1.94)
Kappa:Lambda Ratio: 18.12 — ABNORMAL HIGH (ref 0.26–1.65)
Lambda Free Lght Chn: 2.71 mg/dL — ABNORMAL HIGH (ref 0.57–2.63)

## 2014-04-14 NOTE — Addendum Note (Signed)
Addended by: Laureen Abrahams on: 04/14/2014 06:43 PM   Modules accepted: Orders, Medications

## 2014-04-15 ENCOUNTER — Ambulatory Visit: Payer: Medicare Other | Admitting: Podiatrist

## 2014-04-15 ENCOUNTER — Ambulatory Visit (INDEPENDENT_AMBULATORY_CARE_PROVIDER_SITE_OTHER): Payer: Medicare Other | Admitting: Podiatrist

## 2014-04-15 ENCOUNTER — Encounter: Payer: Self-pay | Admitting: Podiatrist

## 2014-04-15 ENCOUNTER — Other Ambulatory Visit: Payer: Medicare Other

## 2014-04-15 VITALS — BP 165/84 | HR 62 | Resp 17 | Ht 70.0 in | Wt 170.0 lb

## 2014-04-15 DIAGNOSIS — M869 Osteomyelitis, unspecified: Secondary | ICD-10-CM

## 2014-04-15 NOTE — Patient Instructions (Signed)
Keep your dressing on for 5 days, then you may remove it and replace it with a new bandage.  You may put antibiotic ointment over the incision site.  Watch for any redness, swelling, oozing of yellowish fluid or malodor-- if you notice any of these symptoms, please let me know.    I will remove your sutures at the next visit--   Wear your loose fitting shoe and avoid as much pressure as you can on the foot.

## 2014-04-16 ENCOUNTER — Telehealth: Payer: Self-pay

## 2014-04-16 NOTE — Telephone Encounter (Signed)
Spoke with pt regarding status post surgery. He stated that he is doing " pretty good", he is taking Rx pain medications. Pain is managed effectively. Advised to elevate and rest, advised on s/s of infection. No other issues at this time.

## 2014-04-17 ENCOUNTER — Telehealth: Payer: Self-pay | Admitting: *Deleted

## 2014-04-17 NOTE — Telephone Encounter (Signed)
Return call before 2pm local 04/20/2014 to ensure we fulfil patient's request.  It's a  drug utilization issue.  Patient's Identification number is  [id number].  I returned the call.  I spoke to [facility] [place].  They were questioning 2 prescriptions for Requip.  There was one for 0.5mg  take 6 tablets at bedtime and the was another that said to take one 3mg  tablet at bedtime.  I told them to do the 3mg . Tablet at bedtime.  She said he can get a 90 day supply is this okay.  I informed her yes.  She asked if there were any refills.  I told her 1 refill.

## 2014-04-19 ENCOUNTER — Other Ambulatory Visit: Payer: Self-pay | Admitting: Oncology

## 2014-04-20 ENCOUNTER — Telehealth: Payer: Self-pay | Admitting: Oncology

## 2014-04-20 NOTE — Progress Notes (Signed)
Dr. Clemetine Marker presents for completion of amputation left 2nd toe.  He previously underwent a partial amputation but because of poor circulation, it did not heal up.  The distal portion of the head of the proximal phalanx is visible at the tip of the toe.  We are removing the remainder of the proximal phalanx today.  Objective:  Overall appearance of the color, texture and tone of the left foot is significantly improved s/p bypass.  The left 2nd toe still has visible proximal phalanx head that does not appear to be improving despite restored blood flow to the foot.  Assessment:  Exposed 2nd digit left foot  Plan:  The toe was anesthetized and a sterile prep performed in the or suite.  An incision was made medially at the 2nd digit and the proxmal phalanx was removed from its attachment at the metatarsal head.  The proximal portion of the phalanx appeared healthy.  The skin was then re approximated with 5.0 nylon.  No tourniquet was used and good bleeding at the digit was seen. A total of 5cc's of blood was lost throughout the procedure.  A dry, sterile dressing was applied and the patient was given instructions on aftercare.  I will see him back for his post operative appointment follow up and he will call if any questions or concerns arise.

## 2014-04-21 ENCOUNTER — Encounter (HOSPITAL_COMMUNITY): Payer: Medicare Other

## 2014-04-21 ENCOUNTER — Ambulatory Visit (HOSPITAL_COMMUNITY)
Admission: RE | Admit: 2014-04-21 | Discharge: 2014-04-21 | Disposition: A | Discharge status: Home / Self Care | Payer: Medicare Other | Source: Ambulatory Visit | Attending: Internal Medicine | Admitting: Internal Medicine

## 2014-04-21 DIAGNOSIS — I739 Peripheral vascular disease, unspecified: Secondary | ICD-10-CM | POA: Insufficient documentation

## 2014-04-21 DIAGNOSIS — R0989 Other specified symptoms and signs involving the circulatory and respiratory systems: Secondary | ICD-10-CM

## 2014-04-21 NOTE — Progress Notes (Signed)
Left Lower Extremity Arterial Duplex Completed. °Brianna L Mazza,RVT °

## 2014-04-23 ENCOUNTER — Telehealth (HOSPITAL_COMMUNITY): Payer: Self-pay | Admitting: *Deleted

## 2014-04-24 ENCOUNTER — Ambulatory Visit (HOSPITAL_BASED_OUTPATIENT_CLINIC_OR_DEPARTMENT_OTHER): Payer: Medicare Other

## 2014-04-24 ENCOUNTER — Other Ambulatory Visit (HOSPITAL_BASED_OUTPATIENT_CLINIC_OR_DEPARTMENT_OTHER): Payer: Medicare Other

## 2014-04-24 VITALS — BP 127/56 | HR 75 | Temp 97.5°F

## 2014-04-24 DIAGNOSIS — D631 Anemia in chronic kidney disease: Secondary | ICD-10-CM

## 2014-04-24 DIAGNOSIS — N039 Chronic nephritic syndrome with unspecified morphologic changes: Principal | ICD-10-CM

## 2014-04-24 DIAGNOSIS — N189 Chronic kidney disease, unspecified: Secondary | ICD-10-CM

## 2014-04-24 DIAGNOSIS — N289 Disorder of kidney and ureter, unspecified: Secondary | ICD-10-CM

## 2014-04-24 LAB — CBC & DIFF AND RETIC
BASO%: 0.3 % (ref 0.0–2.0)
Basophils Absolute: 0 10*3/uL (ref 0.0–0.1)
EOS%: 2 % (ref 0.0–7.0)
Eosinophils Absolute: 0.1 10*3/uL (ref 0.0–0.5)
HCT: 27.2 % — ABNORMAL LOW (ref 38.4–49.9)
HGB: 9 g/dL — ABNORMAL LOW (ref 13.0–17.1)
Immature Retic Fract: 10.1 % (ref 3.00–10.60)
LYMPH%: 31.8 % (ref 14.0–49.0)
MCH: 28.8 pg (ref 27.2–33.4)
MCHC: 33.1 g/dL (ref 32.0–36.0)
MCV: 87.2 fL (ref 79.3–98.0)
MONO#: 0.8 10*3/uL (ref 0.1–0.9)
MONO%: 12.6 % (ref 0.0–14.0)
NEUT#: 3.6 10*3/uL (ref 1.5–6.5)
NEUT%: 53.3 % (ref 39.0–75.0)
Platelets: 207 10*3/uL (ref 140–400)
RBC: 3.12 10*6/uL — ABNORMAL LOW (ref 4.20–5.82)
RDW: 15.6 % — ABNORMAL HIGH (ref 11.0–14.6)
Retic %: 2.15 % — ABNORMAL HIGH (ref 0.80–1.80)
Retic Ct Abs: 67.08 10*3/uL (ref 34.80–93.90)
WBC: 6.7 10*3/uL (ref 4.0–10.3)
lymph#: 2.1 10*3/uL (ref 0.9–3.3)

## 2014-04-24 MED ORDER — DARBEPOETIN ALFA-POLYSORBATE 200 MCG/0.4ML IJ SOLN
200.0000 ug | Freq: Once | INTRAMUSCULAR | Status: AC
  Administered 2014-04-24: 200 ug via SUBCUTANEOUS
  Filled 2014-04-24: qty 0.4

## 2014-04-29 ENCOUNTER — Ambulatory Visit: Payer: Medicare Other

## 2014-04-29 ENCOUNTER — Ambulatory Visit (INDEPENDENT_AMBULATORY_CARE_PROVIDER_SITE_OTHER): Payer: Medicare Other | Admitting: Podiatrist

## 2014-04-29 ENCOUNTER — Encounter: Payer: Self-pay | Admitting: Podiatrist

## 2014-04-29 VITALS — BP 127/56 | HR 66 | Resp 16

## 2014-04-29 DIAGNOSIS — S98139A Complete traumatic amputation of one unspecified lesser toe, initial encounter: Secondary | ICD-10-CM

## 2014-04-29 DIAGNOSIS — Z09 Encounter for follow-up examination after completed treatment for conditions other than malignant neoplasm: Secondary | ICD-10-CM

## 2014-04-29 MED ORDER — CEPHALEXIN 500 MG PO CAPS: 500.0000 mg | ORAL_CAPSULE | Freq: Three times a day (TID) | ORAL | Status: DC

## 2014-04-30 NOTE — Progress Notes (Signed)
Patient presents today status post 2nd digit completion of amputation of the left 2nd toe.  He states the toe is doing OK.  It is swelling.  He has been changing the dressing as instructed.  Denies any systemic signs of infection  Objective:  Neurovascular status unchanged.  Incision site appears well coapted with sutures in place.  Patient requesting only every other suture to be removed.  There is swelling of the forefoot left.  No streaking, drainage or lymphangitis present.  Mild discomfort with palpation is present  Assessment: status post 2nd digit amputation at mpj  Plan:  Removed every other suture today.  Redressed the foot in a dry, sterile dressing.  Recommended keflex for a few days to address the swelling concerns.  He will return in 1 week for recheck and removal of sutures.  If he notices any signs of infection he is to call.

## 2014-05-06 ENCOUNTER — Encounter: Payer: Self-pay | Admitting: Podiatrist

## 2014-05-06 ENCOUNTER — Ambulatory Visit (INDEPENDENT_AMBULATORY_CARE_PROVIDER_SITE_OTHER): Payer: Medicare Other | Admitting: Podiatrist

## 2014-05-06 VITALS — BP 135/63 | HR 70 | Resp 16

## 2014-05-06 DIAGNOSIS — Z09 Encounter for follow-up examination after completed treatment for conditions other than malignant neoplasm: Secondary | ICD-10-CM

## 2014-05-06 DIAGNOSIS — S98139A Complete traumatic amputation of one unspecified lesser toe, initial encounter: Secondary | ICD-10-CM

## 2014-05-06 MED ORDER — CEPHALEXIN 500 MG PO CAPS: 500.0000 mg | ORAL_CAPSULE | Freq: Three times a day (TID) | ORAL | Status: DC

## 2014-05-06 NOTE — Progress Notes (Signed)
Patient presents today status post 2nd digit completion of amputation of the left 2nd toe. He states the toe is doing OK. It is still swelling despite being on antibiotics. He states is not hurting. He does state that the stump rubs on the tip of his tennis shoe and causes irritation.. He has been changing the dressing as instructed. Denies any systemic signs of infection   Objective: Neurovascular status unchanged. Incision site appears well coapted remainder of sutures are removed at today's visit. No dehiscence of the wound is noted. There is continued swelling of the forefoot left. No streaking, drainage or lymphangitis present. Mild discomfort with palpation is present.  Discomfort on the plantar lateral aspect of the left foot is also palpated submetatarsal 5. It appears to be swollen and from pressure more so than a callus which he is concerned of  Assessment: status post 2nd digit amputation at mpj   Plan: Removed remainder of suture today. Redressed the foot in a dry, sterile dressing. Recommended keflex for a few more days to address the swelling concerns. He will return in 2 weeks for recheck.  He will get back in his [place] shoe to prevent rubbing at the tip of the amputation site. If he notices any signs of infection he is to call.

## 2014-05-08 ENCOUNTER — Ambulatory Visit (HOSPITAL_BASED_OUTPATIENT_CLINIC_OR_DEPARTMENT_OTHER): Payer: Medicare Other

## 2014-05-08 ENCOUNTER — Other Ambulatory Visit (HOSPITAL_BASED_OUTPATIENT_CLINIC_OR_DEPARTMENT_OTHER): Payer: Medicare Other

## 2014-05-08 VITALS — BP 154/72 | HR 64 | Temp 98.5°F

## 2014-05-08 DIAGNOSIS — N189 Chronic kidney disease, unspecified: Secondary | ICD-10-CM

## 2014-05-08 DIAGNOSIS — D631 Anemia in chronic kidney disease: Secondary | ICD-10-CM

## 2014-05-08 DIAGNOSIS — N289 Disorder of kidney and ureter, unspecified: Secondary | ICD-10-CM

## 2014-05-08 DIAGNOSIS — N039 Chronic nephritic syndrome with unspecified morphologic changes: Secondary | ICD-10-CM

## 2014-05-08 DIAGNOSIS — C9000 Multiple myeloma not having achieved remission: Secondary | ICD-10-CM

## 2014-05-08 LAB — CBC & DIFF AND RETIC
BASO%: 0.3 % (ref 0.0–2.0)
Basophils Absolute: 0 10*3/uL (ref 0.0–0.1)
EOS ABS: 0.1 10*3/uL (ref 0.0–0.5)
EOS%: 0.8 % (ref 0.0–7.0)
HCT: 28.6 % — ABNORMAL LOW (ref 38.4–49.9)
HGB: 9.3 g/dL — ABNORMAL LOW (ref 13.0–17.1)
Immature Retic Fract: 9.9 % (ref 3.00–10.60)
LYMPH%: 30.9 % (ref 14.0–49.0)
MCH: 28.4 pg (ref 27.2–33.4)
MCHC: 32.5 g/dL (ref 32.0–36.0)
MCV: 87.2 fL (ref 79.3–98.0)
MONO#: 0.9 10*3/uL (ref 0.1–0.9)
MONO%: 14.1 % — ABNORMAL HIGH (ref 0.0–14.0)
NEUT#: 3.4 10*3/uL (ref 1.5–6.5)
NEUT%: 53.9 % (ref 39.0–75.0)
Platelets: 240 10*3/uL (ref 140–400)
RBC: 3.28 10*6/uL — ABNORMAL LOW (ref 4.20–5.82)
RDW: 15.9 % — ABNORMAL HIGH (ref 11.0–14.6)
RETIC CT ABS: 83.64 10*3/uL (ref 34.80–93.90)
Retic %: 2.55 % — ABNORMAL HIGH (ref 0.80–1.80)
WBC: 6.2 10*3/uL (ref 4.0–10.3)
lymph#: 1.9 10*3/uL (ref 0.9–3.3)

## 2014-05-08 LAB — COMPREHENSIVE METABOLIC PANEL (CC13)
ALK PHOS: 50 U/L (ref 40–150)
ALT: 12 U/L (ref 0–55)
AST: 18 U/L (ref 5–34)
Albumin: 3.3 g/dL — ABNORMAL LOW (ref 3.5–5.0)
Anion Gap: 6 mEq/L (ref 3–11)
BILIRUBIN TOTAL: 0.51 mg/dL (ref 0.20–1.20)
BUN: 18.2 mg/dL (ref 7.0–26.0)
CO2: 24 mEq/L (ref 22–29)
CREATININE: 1 mg/dL (ref 0.7–1.3)
Calcium: 9.2 mg/dL (ref 8.4–10.4)
Chloride: 95 mEq/L — ABNORMAL LOW (ref 98–109)
Glucose: 108 mg/dl (ref 70–140)
POTASSIUM: 4.5 meq/L (ref 3.5–5.1)
Sodium: 125 mEq/L — ABNORMAL LOW (ref 136–145)
Total Protein: 7.1 g/dL (ref 6.4–8.3)

## 2014-05-08 MED ORDER — DARBEPOETIN ALFA-POLYSORBATE 500 MCG/ML IJ SOLN
200.0000 ug | Freq: Once | INTRAMUSCULAR | Status: AC
  Administered 2014-05-08: 200 ug via SUBCUTANEOUS
  Filled 2014-05-08: qty 1

## 2014-05-11 LAB — KAPPA/LAMBDA LIGHT CHAINS
KAPPA LAMBDA RATIO: 20.41 — AB (ref 0.26–1.65)
Kappa free light chain: 59.4 mg/dL — ABNORMAL HIGH (ref 0.33–1.94)
LAMBDA FREE LGHT CHN: 2.91 mg/dL — AB (ref 0.57–2.63)

## 2014-05-12 LAB — PROTEIN / CREATININE RATIO, URINE
CREATININE, URINE: 69.5 mg/dL
PROTEIN CREATININE RATIO: 0.14 (ref <0.15)
TOTAL PROTEIN, URINE: 10 mg/dL

## 2014-05-13 ENCOUNTER — Other Ambulatory Visit: Payer: Medicare Other

## 2014-05-13 ENCOUNTER — Other Ambulatory Visit: Payer: Self-pay | Admitting: Physician Assistant

## 2014-05-20 ENCOUNTER — Encounter: Payer: Self-pay | Admitting: Podiatrist

## 2014-05-20 ENCOUNTER — Ambulatory Visit (INDEPENDENT_AMBULATORY_CARE_PROVIDER_SITE_OTHER): Payer: Medicare Other | Admitting: Podiatrist

## 2014-05-20 ENCOUNTER — Ambulatory Visit (INDEPENDENT_AMBULATORY_CARE_PROVIDER_SITE_OTHER): Payer: Medicare Other

## 2014-05-20 VITALS — BP 146/74 | HR 60 | Resp 16 | Ht 70.0 in | Wt 175.0 lb

## 2014-05-20 DIAGNOSIS — S98132A Complete traumatic amputation of one left lesser toe, initial encounter: Secondary | ICD-10-CM

## 2014-05-20 DIAGNOSIS — Z09 Encounter for follow-up examination after completed treatment for conditions other than malignant neoplasm: Secondary | ICD-10-CM

## 2014-05-20 DIAGNOSIS — S98139A Complete traumatic amputation of one unspecified lesser toe, initial encounter: Secondary | ICD-10-CM

## 2014-05-20 NOTE — Progress Notes (Signed)
Patient presents today status post 2nd digit  amputation of the left 2nd toe. He states the toe is doing OK. He states the foot is doing well however it still swollen at the tip. The incision site has healed completely however there is still some redness present. He also relates he has a callus on the left foot submetatarsal 5 which is bothersome as well.   Objective: Neurovascular status unchanged with decreased pulses noted.. Incision site appears well coapted and healed completely. There is continued swelling of the forefoot left. No streaking, drainage or lymphangitis present. Mild discomfort with palpation is present. Discomfort on the plantar lateral aspect of the left foot is also palpated submetatarsal 5 with a slight hyperkeratotic lesion overlying. Heel ulcer also appears to be almost completely healed   Assessment: status post 2nd digit amputation at mpj   Plan: Recommended a accommodative orthotic to offload the submetatarsal region of the left foot as well as the forefoot filler. Discussed that he is still swollen and therefore the orthotic may need to be adjusted or remade weight on getting. At this point the foot appears to be healing nicely. He will be seen back for recheck as needed and if problems arise he will call.

## 2014-05-21 ENCOUNTER — Other Ambulatory Visit (HOSPITAL_BASED_OUTPATIENT_CLINIC_OR_DEPARTMENT_OTHER): Payer: Medicare Other

## 2014-05-21 ENCOUNTER — Ambulatory Visit (HOSPITAL_BASED_OUTPATIENT_CLINIC_OR_DEPARTMENT_OTHER): Payer: Medicare Other

## 2014-05-21 VITALS — BP 129/58 | HR 73 | Temp 97.6°F

## 2014-05-21 DIAGNOSIS — D631 Anemia in chronic kidney disease: Secondary | ICD-10-CM

## 2014-05-21 DIAGNOSIS — N039 Chronic nephritic syndrome with unspecified morphologic changes: Secondary | ICD-10-CM

## 2014-05-21 DIAGNOSIS — N289 Disorder of kidney and ureter, unspecified: Secondary | ICD-10-CM

## 2014-05-21 DIAGNOSIS — N189 Chronic kidney disease, unspecified: Secondary | ICD-10-CM

## 2014-05-21 DIAGNOSIS — C9000 Multiple myeloma not having achieved remission: Secondary | ICD-10-CM

## 2014-05-21 LAB — CBC & DIFF AND RETIC
BASO%: 0.4 % (ref 0.0–2.0)
Basophils Absolute: 0 10*3/uL (ref 0.0–0.1)
EOS%: 2 % (ref 0.0–7.0)
Eosinophils Absolute: 0.1 10*3/uL (ref 0.0–0.5)
HCT: 27.9 % — ABNORMAL LOW (ref 38.4–49.9)
HGB: 9.2 g/dL — ABNORMAL LOW (ref 13.0–17.1)
Immature Retic Fract: 6.4 % (ref 3.00–10.60)
LYMPH#: 2.2 10*3/uL (ref 0.9–3.3)
LYMPH%: 31.5 % (ref 14.0–49.0)
MCH: 28.1 pg (ref 27.2–33.4)
MCHC: 33 g/dL (ref 32.0–36.0)
MCV: 85.3 fL (ref 79.3–98.0)
MONO#: 1 10*3/uL — ABNORMAL HIGH (ref 0.1–0.9)
MONO%: 13.8 % (ref 0.0–14.0)
NEUT%: 52.3 % (ref 39.0–75.0)
NEUTROS ABS: 3.6 10*3/uL (ref 1.5–6.5)
NRBC: 0 % (ref 0–0)
Platelets: 218 10*3/uL (ref 140–400)
RBC: 3.27 10*6/uL — AB (ref 4.20–5.82)
RDW: 16.2 % — AB (ref 11.0–14.6)
RETIC %: 3.44 % — AB (ref 0.80–1.80)
RETIC CT ABS: 112.49 10*3/uL — AB (ref 34.80–93.90)
WBC: 6.9 10*3/uL (ref 4.0–10.3)

## 2014-05-21 MED ORDER — DARBEPOETIN ALFA-POLYSORBATE 200 MCG/0.4ML IJ SOLN
200.0000 ug | Freq: Once | INTRAMUSCULAR | Status: AC
  Administered 2014-05-21: 200 ug via SUBCUTANEOUS
  Filled 2014-05-21: qty 0.4

## 2014-05-27 ENCOUNTER — Ambulatory Visit (HOSPITAL_BASED_OUTPATIENT_CLINIC_OR_DEPARTMENT_OTHER): Payer: Medicare Other | Admitting: Oncology

## 2014-05-27 ENCOUNTER — Telehealth: Payer: Self-pay | Admitting: Oncology

## 2014-05-27 VITALS — BP 123/63 | HR 76 | Temp 97.8°F | Resp 18 | Ht 70.0 in | Wt 189.4 lb

## 2014-05-27 DIAGNOSIS — D638 Anemia in other chronic diseases classified elsewhere: Secondary | ICD-10-CM

## 2014-05-27 DIAGNOSIS — M869 Osteomyelitis, unspecified: Secondary | ICD-10-CM

## 2014-05-27 DIAGNOSIS — C9000 Multiple myeloma not having achieved remission: Secondary | ICD-10-CM

## 2014-05-27 DIAGNOSIS — L98499 Non-pressure chronic ulcer of skin of other sites with unspecified severity: Principal | ICD-10-CM

## 2014-05-27 DIAGNOSIS — N289 Disorder of kidney and ureter, unspecified: Secondary | ICD-10-CM

## 2014-05-27 DIAGNOSIS — I70209 Unspecified atherosclerosis of native arteries of extremities, unspecified extremity: Secondary | ICD-10-CM

## 2014-05-27 DIAGNOSIS — I739 Peripheral vascular disease, unspecified: Secondary | ICD-10-CM

## 2014-05-27 MED ORDER — HYDROMORPHONE HCL 4 MG PO TABS: 6.0000 mg | ORAL_TABLET | Freq: Four times a day (QID) | ORAL | Status: DC | PRN

## 2014-05-27 NOTE — Telephone Encounter (Signed)
per pof to sch pt appt and gave pt copy of sch-per GM to remove old lab Recurr sch

## 2014-05-27 NOTE — Progress Notes (Signed)
ID: Timothy [name]   DOB: 1928-09-10  MR#: [id number]  PXT#:[id number]  PCP:  SU: OTHER MD: Dorna Leitz, Alysia Penna, Kyra Leyland  CHIEF COMPLAINT: multiple myeloma CURRENT THERAPY: under observation  MYELOMA HISTORY: Patient presented in April 2007 with symptomatic slowly progressive anemia, with normal iron parameters, folic acid, and vitamin B12. Plans were made to obtain bone marrow aspirate and biopsy as an outpatient, however the patient was hospitalized shortly thereafter with a creatinine of 8.6. Renal biopsy demonstrated the presence of  light chain deposition disease. Bone marrow biopsy demonstrated 37% plasma cells. A skeletal survey showed small calvarial lytic lesions, mild osteopenia, and cervical spondylosis. With a well-established diagnosis of light-chain multiple myeloma Dr. Melodie Bouillon subsequent treatments are as detailed below.   INTERVAL HISTORY: Timothy [name] returns today with his wife, Timothy [name], for followup of his multiple myeloma. From a myeloma point of view he is doing well. The biggest problem he is having is his balance. He was dizzy previously but now it really his feet. He has very little sensation there. He does much better with a walker, and he feels he can go about 100 yours with his walker before having to stop.  REVIEW OF SYSTEMS: Timothy [name] tells me his pain is "not too bad", and a really bad days he may use diluted 4 times, but some days he does not use it at all. He has no problems with constipation. Urine is fine except that it keeps him up at night with nocturia 3 or 4 times every night. This is not a new problem of course. He has no pain in his feet thankfully. The pain is mostly in his lower back. This is not a new symptom either. Overall he is doing a little bit better clinically, although still uncomfortable and he hates to use a walker. On the other hand it would be not a good thing if he were to  fall and injure himself. He understands that while. A detailed review of systems today was otherwise stable  PAST MEDICAL HISTORY: Past Medical History  Diagnosis Date  . ASCVD (arteriosclerotic cardiovascular disease)   . Anemia     chronic mild anemia  . Gout   . Hypertension   . Lumbar disc disease     post laminectomy  . Chronic back pain   . History of echocardiogram 11/02/2010    EF range of 30 to 35% / There is hypokinsesis of the basal-mild inferolateral myocardium  . CHF (congestive heart failure)   . Heart murmur   . Ischemic cardiomyopathy   . Renal insufficiency   . Peripheral neuropathy   . Inferior myocardial infarction 1986  . Hyperlipidemia   . SOB (shortness of breath)   . Fatigue   . Peripheral neuropathy   . Coronary artery disease     CABG 2002 by Dr. Roxy Manns with LIMA to LAD, SVG to intermediate, SVG to LCX, & SVG to PL. Last nuclear in 2012 showing large inferior scar with EF of 39%.   . Blood transfusion     DEC 2012 - TWO UNITS  . Myeloma     Multiple myeloma, with recurrence of increasing problems  DR. Brice -ONCOLOGIST.  PT HAS BEEN OFF CHEMO SINCE HIS HOSPITALIZATION FEB 2013 FOR LEFT FOOT INFECTION  . Cancer   . History of shingles MARCH 2009    SHINGLE LESIONS WERE AROUND RIGHT EYE--PT HAS RESIDUAL ITCHING AROUND THE EYE.  . Osteomyelitis  s/p toe amputation  . Chronic systolic heart failure     EF of 30% per echo 02/2012  . Protein malnutrition   . Critical lower limb ischemia    PAST SURGICAL HISTORY: Past Surgical History  Procedure Laterality Date  . Laminectomy    . Inguinal hernia repair    . Cardiac catheterization  06/20/2000    Severe coronary disease (totally occluded right artery, 90% left circumflex, 50-60% intermediate, and 90-95% ostial left anterior descending)   . Tonsillectomy    . Coronary artery bypass graft  2002    x4 / Left internal mammary artery to the LAD.  / Saphenous vein graft to the right posterolateral.  /   Saphenous vein graft to  the ramus intermedius. / Saphenous vein graft to the circumflex marginal     . Amputation  03/06/2012    Procedure: AMPUTATION RAY;  Surgeon: Alta Corning, MD;  Location: [facility];  Service: Orthopedics;  Laterality: Left;  First Ray Amputation  . Intramedullary (im) nail intertrochanteric Right 08/17/2013    Procedure: INTRAMEDULLARY (IM) NAIL INTERTROCHANTRIC;  Surgeon: Marybelle Killings, MD;  Location: [facility];  Service: Orthopedics;  Laterality: Right;  . Endarterectomy femoral Left 03/24/2014    Procedure: Left Common Femoral  Artery Endarterectomy with Vein Patch Angioplasty,  Ultrasound ;  Surgeon: Angelia Mould, MD;  Location: [place];  Service: Vascular;  Laterality: Left;  . 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 He and his wife Timothy [name]. the only once at home.    ADVANCED DIRECTIVES:  HEALTH MAINTENANCE: History  Substance Use Topics  . Smoking status: Former Smoker -- 1.00 packs/day for 20 years    Types: Cigarettes    Quit date: 11/20/1984  . Smokeless tobacco: Never Used  . Alcohol Use: No     Colonoscopy:  Bone density:  Lipid panel:  Allergies  Allergen Reactions  . Morphine And Related Other (See Comments)    Agitated with morphine drip    Current Outpatient Prescriptions  Medication Sig Dispense Refill  . allopurinol (ZYLOPRIM) 300 MG tablet Take 0.5 tablets (150 mg total) by mouth daily.  90 tablet  0  . aspirin EC 81 MG tablet Take 81 mg by mouth 3 (three) times a week. Monday, Wednesday, Friday      . atorvastatin (LIPITOR) 10 MG tablet Take 10 mg by mouth every evening.      . B Complex-Biotin-FA (B COMPLETE PO) Take 1 tablet by mouth daily.       . carvedilol (COREG) 6.25 MG tablet Take 6.25 mg by mouth 2 (two) times daily with a meal.      .  cephALEXin (KEFLEX) 500 MG capsule Take 1 capsule (500 mg total) by mouth 3 (three) times daily.  21 capsule  0  . Coenzyme Q10 (CO Q-10 PO) Take 1 tablet by mouth every morning.       . DULoxetine (CYMBALTA) 60 MG capsule Take 2 capsules (120 mg total) by mouth every evening.  180 capsule  1  . HYDROmorphone (DILAUDID) 4 MG tablet Take 6 mg by mouth every 6 (six) hours as needed for severe pain.      Marland Kitchen lisinopril (PRINIVIL,ZESTRIL) 20 MG tablet Take 20 mg by mouth daily.      . Multiple Vitamin (MULTIVITAMIN) capsule Take 1 capsule  by mouth every morning.       . nitroGLYCERIN (NITROSTAT) 0.4 MG SL tablet Place 0.4 mg under the tongue every 5 (five) minutes as needed for chest pain.      . polyethylene glycol (MIRALAX / GLYCOLAX) packet Take 17 g by mouth daily as needed.  14 each  0  . rOPINIRole (REQUIP) 0.5 MG tablet Take 6 tablets (3 mg total) by mouth at bedtime.  30 tablet  4  . rOPINIRole (REQUIP) 3 MG tablet Take 1 tablet (3 mg total) by mouth at bedtime.  30 tablet  0  . tamsulosin (FLOMAX) 0.4 MG CAPS capsule Take 0.4 mg by mouth at bedtime.      Marland Kitchen tobramycin (TOBREX) 0.3 % ophthalmic solution Place 1 drop into both eyes daily as needed (for Bacterial eye infection).       . vitamin C (ASCORBIC ACID) 500 MG tablet Take 500 mg by mouth every morning.       No current facility-administered medications for this visit.    Objective: Older white male who ambulates with a walker Filed Vitals:   05/27/14 1333  BP: 123/63  Pulse: 76  Temp: 97.8 F (36.6 C)  Resp: 18        Body mass index is 27.18 kg/(m^2).    ECOG FS: 2 Filed Weights   05/27/14 1333  Weight: 189 lb 6.4 oz (85.911 kg)   Sclerae unicteric, pupils round and equal Oropharynx clear and slightly dry No cervical or supraclavicular adenopathy Lungs clear bilaterally  Heart regular rate and rhythm, no murmur appreciated Abdomen soft, nontender, positive bowel sounds MSK no focal spinal tenderness including palpation  of the lower spine Neuro: nonfocal, well oriented, positive affect   LAB RESULTS:    Results for JAZMINE, LONGSHORE (MRN [id number]) as of 05/27/2014 13:47  Ref. Range 12/31/2013 11:05 02/04/2014 11:13 03/11/2014 10:58 04/10/2014 11:09 05/08/2014 14:14  Kappa free light chain Latest Range: 0.33-1.94 mg/dL 43.00 (H) 69.50 (H) 71.00 (H) 49.10 (H) 59.40 (H)  Lambda Free Lght Chn Latest Range: 0.57-2.63 mg/dL 2.56 2.18 2.88 (H) 2.71 (H) 2.91 (H)  Kappa:Lambda Ratio Latest Range: 0.26-1.65  16.80 (H) 31.88 (H) 24.65 (H) 18.12 (H) 20.41 (H)    Lab Results  Component Value Date   WBC 6.9 05/21/2014   NEUTROABS 3.6 05/21/2014   HGB 9.2* 05/21/2014   HCT 27.9* 05/21/2014   MCV 85.3 05/21/2014   PLT 218 05/21/2014      Chemistry      Component Value Date/Time   NA 125* 05/08/2014 1414   NA 130* 03/26/2014 0329   K 4.5 05/08/2014 1414   K 3.8 03/26/2014 0329   CL 92* 03/26/2014 0329   CL 98 05/12/2013 1048   CO2 24 05/08/2014 1414   CO2 22 03/26/2014 0329   BUN 18.2 05/08/2014 1414   BUN 22 03/26/2014 0329   CREATININE 1.0 05/08/2014 1414   CREATININE 1.15 03/26/2014 0329      Component Value Date/Time   CALCIUM 9.2 05/08/2014 1414   CALCIUM 8.5 03/26/2014 0329   ALKPHOS 50 05/08/2014 1414   ALKPHOS 42 03/25/2014 2115   AST 18 05/08/2014 1414   AST 25 03/25/2014 2115   ALT 12 05/08/2014 1414   ALT 10 03/25/2014 2115   BILITOT 0.51 05/08/2014 1414   BILITOT 0.7 03/25/2014 2115    Results for TADD, HOLTMEYER (MRN [id number]) as of 05/27/2014 13:47  Ref. Range 10/02/2006 14:11 01/01/2007 12:38 03/12/2007 10:00 04/30/2007 12:32 05/14/2007 13:00  RETIC # Latest Range: 31.8-103.9 10e3/uL 49.0 121.9 (H) 45.1 30.1 (L) 76.5    STUDIES: Dg Foot Complete Left  05/26/2014   Radiology report: 3 views of the left foot are obtained. Amputation of the  second digit itself is noted. Swelling at the stump is also present. No  emphysema within the soft tissue structures is seen. No cortical  disruption of the head of the second metatarsal is  noted.   ASSESSMENT: 80 y.o.  with kappa light chain multiple myeloma   (1) presenting April 2007 with anemia and renal failure; renal Bx showing free kappa light chain deposition; bone marrow biopsy showing 37% plasma cells, with normal cytogenetics and FISH;bone survey showing skull lytic lesions; treated with  (2) thalidomide (200 mg/d) and dexamethasone (40 mg/d x4d Q28d) June through Nov 2007, with good response, but poor tolerance;  (3) thalidomide decreased to 100 mg/d, dexamethasone continued, bortezomib (IV) added, Dec 2007 to March 2008  (4) treatment interrupted by multiple complications (peripheral neuropathy, pulmonary embolism, diverticular abscess, CN V zoster, severe DDD, congestive heart failure, rising PSA); maintenance zolendronic acid through March 2012  (5) progression April 2012, treated initially with dexamethasone alone, poorly tolerated;  (6) bortezomib (sq) resumed July 2012; cyclophosphamide and dexamethasone added Sept 2012; zolendronic acid changed to Q 25month; all treatments held as of March 2013 due to the development of the Left foot ulcer described above (7) anemia, on aranesp December 2012 to August 2013; resumed Q14d starting May 2015 (8) status post Right trochanteric nail with proximal and distal interlock  DePuy 11 mm one-pin lag screw, 44 mm distal interlock 08/09/2013 for a right comminuted intertrochanteric fracture (9) status post removal of the first 2 toes left foot (10) Left common femoral artery endarterectomy with vein patch angioplasty using left greater saphenous vein 03/24/2014  PLAN:   Timothy [name]'s kappa light chains and light chain ratio continue to showed no obvious pattern. Clinically he is if anything a bit improved. I don't see any urgent need to start treatment at this point for his myeloma and we will continue observation. Of course we follow his labs on a monthly basis  He seems to be responding to the iron as assess reticulocyte count has  climbed some. My goal for him is just around 132 certainly not much more than that. I think we could expect some improvement in functional status as soon as his hemoglobin gets above 10. I again encouraged him to keep himself well hydrated.  He will continue to come for labs and his aranesp every 14 days, myeloma labs every 28 days, and he will see me again on September 11. He knows to call for any problems that may develop before that visit.     Lashundra Shiveley C    05/27/2014

## 2014-05-29 ENCOUNTER — Encounter: Payer: Self-pay | Admitting: Cardiovascular Disease

## 2014-05-29 ENCOUNTER — Ambulatory Visit (INDEPENDENT_AMBULATORY_CARE_PROVIDER_SITE_OTHER): Payer: Medicare Other | Admitting: Cardiovascular Disease

## 2014-05-29 VITALS — BP 139/62 | HR 74 | Ht 70.0 in | Wt 187.1 lb

## 2014-05-29 DIAGNOSIS — I2510 Atherosclerotic heart disease of native coronary artery without angina pectoris: Secondary | ICD-10-CM

## 2014-05-29 DIAGNOSIS — I999 Unspecified disorder of circulatory system: Secondary | ICD-10-CM

## 2014-05-29 DIAGNOSIS — I70229 Atherosclerosis of native arteries of extremities with rest pain, unspecified extremity: Secondary | ICD-10-CM

## 2014-05-29 DIAGNOSIS — I998 Other disorder of circulatory system: Secondary | ICD-10-CM

## 2014-05-29 NOTE — Assessment & Plan Note (Signed)
I performed lower extremity angiography 03/23/14 revealing a 30-40% calcified proximal left common iliac artery stenosis, 95% calcified distal left common femoral artery stenosis occluded profunda femoris, 50-70% segmental. Ultimately underwent left common femoral endarterectomy and patch angioplasty by Dr. Gae Gallop with an excellent clinical result. His followup Dopplers revealed a left ABI of 0.71 with normal velocities in his common femoral artery and moderately high velocities in the origin of his left common iliac which we will follow by semiannual duplex ultrasounds. The wound on his left foot has subsequently healed. He denies claudication.

## 2014-05-29 NOTE — Progress Notes (Signed)
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 for peripheral vascular evaluation because of lifestyle  limiting claudication and poorly healing ulcers on his feet. His podiatrist is Dr. Anselm Pancoast and his oncologist Dr. Gunnar Bulla Magrinet. He has a history of hypertension and hyperlipidemia as well as family history of heart disease. He had coronary artery bypass grafting x4 by Dr. Roxy Manns in 2002. A Myoview stress test on January of last year showed scar without ischemia and a 2-D echo revealed an ejection fraction of 35-40%. He denies chest pain or shortness of breath. He also has multiple myeloma which has been quiet and since February 2013 when he received his last chemotherapy treatment. He complains of Left lower extremity lifestyle limiting claudication. He has a slowly healing ulcer on his left heel as well. He has had amputation of his left great toe and a portion of his left second toe. I performed a peripheral angiography 03/22/14 revealing moderate iliac disease, high-grade calcified distal left common femoral artery disease with occluded profunda femoris, moderately calcified segmental mid left SFA disease with two-vessel runoff. The patient ultimately underwent left common femoral artery endarterectomy and patch angioplasty by Dr. Scot Dock with excellent angiographic and ultrasound as well as clinical result. His ulcer ultimately healed.    Current Outpatient Prescriptions  Medication Sig Dispense Refill  . allopurinol (ZYLOPRIM) 300 MG tablet Take 0.5 tablets (150 mg total) by mouth daily.  90 tablet  0  . aspirin EC 81 MG tablet Take 81 mg by mouth 3 (three) times a week. Monday, Wednesday, Friday      . atorvastatin (LIPITOR) 10 MG tablet Take  10 mg by mouth every evening.      . B Complex-Biotin-FA (B COMPLETE PO) Take 1 tablet by mouth daily.       . carvedilol (COREG) 6.25 MG tablet Take 6.25 mg by mouth 2 (two) times daily with a meal.      . Coenzyme Q10 (CO Q-10 PO) Take 1 tablet by mouth every morning.       . DULoxetine (CYMBALTA) 60 MG capsule Take 2 capsules (120 mg total) by mouth every evening.  180 capsule  1  . HYDROmorphone (DILAUDID) 4 MG tablet Take 1.5 tablets (6 mg total) by mouth every 6 (six) hours as needed for severe pain.  120 tablet  0  . lisinopril (PRINIVIL,ZESTRIL) 20 MG tablet Take 20 mg by mouth daily.      . Multiple Vitamin (MULTIVITAMIN) capsule Take 1 capsule by mouth every morning.       . nitroGLYCERIN (NITROSTAT) 0.4 MG SL tablet Place 0.4 mg under the tongue every 5 (five) minutes as needed for chest pain.      . polyethylene glycol (MIRALAX / GLYCOLAX) packet Take 17 g by mouth daily as needed.  14 each  0  . tamsulosin (FLOMAX) 0.4 MG CAPS capsule Take 0.4 mg by mouth at bedtime.      Marland Kitchen tobramycin (TOBREX) 0.3 % ophthalmic solution Place 1 drop into both eyes daily as needed (for Bacterial eye infection).       . vitamin C (ASCORBIC ACID) 500 MG tablet Take 500 mg by mouth every morning.       No current facility-administered medications for this visit.  Allergies  Allergen Reactions  . Morphine And Related Other (See Comments)    Agitated with morphine drip    History   Social History  . Marital Status: Married    Spouse Name: N/A    Number of Children: N/A  . Years of Education: N/A   Occupational History  . Not on file.   Social History Main Topics  . Smoking status: Former Smoker -- 1.00 packs/day for 20 years    Types: Cigarettes    Quit date: 11/20/1984  . Smokeless tobacco: Never Used  . Alcohol Use: No  . Drug Use: No  . Sexual Activity: Not Currently   Other Topics Concern  . Not on file   Social History Narrative   Lives at home with wife, has children but  not with current wife. Still active, golfing and walking in the yard before the issue with the wound, now using a walker.      Review of Systems: General: negative for chills, fever, night sweats or weight changes.  Cardiovascular: negative for chest pain, dyspnea on exertion, edema, orthopnea, palpitations, paroxysmal nocturnal dyspnea or shortness of breath Dermatological: negative for rash Respiratory: negative for cough or wheezing Urologic: negative for hematuria Abdominal: negative for nausea, vomiting, diarrhea, bright red blood per rectum, melena, or hematemesis Neurologic: negative for visual changes, syncope, or dizziness All other systems reviewed and are otherwise negative except as noted above.    Blood pressure 139/62, pulse 74, height $RemoveBe'5\' 10"'dubZhynxN$  (1.778 m), weight 187 lb 1.6 oz (84.868 kg).  General appearance: alert and no distress Neck: no adenopathy, no carotid bruit, no JVD, supple, symmetrical, trachea midline and thyroid not enlarged, symmetric, no tenderness/mass/nodules Lungs: clear to auscultation bilaterally Heart: regular rate and rhythm, S1, S2 normal, no murmur, click, rub or gallop Extremities: extremities normal, atraumatic, no cyanosis or edema  EKG not performed today  ASSESSMENT AND PLAN:   Critical lower limb ischemia I performed lower extremity angiography 03/23/14 revealing a 30-40% calcified proximal left common iliac artery stenosis, 95% calcified distal left common femoral artery stenosis occluded profunda femoris, 50-70% segmental. Ultimately underwent left common femoral endarterectomy and patch angioplasty by Dr. Gae Gallop with an excellent clinical result. His followup Dopplers revealed a left ABI of 0.71 with normal velocities in his common femoral artery and moderately high velocities in the origin of his left common iliac which we will follow by semiannual duplex ultrasounds. The wound on his left foot has subsequently healed. He denies  claudication.      Lorretta Harp MD FACP,FACC,FAHA, FSCAI 05/29/2014 4:20 PM

## 2014-05-29 NOTE — Patient Instructions (Signed)
Your physician recommends that you schedule a follow-up appointment As needed  Your physician has requested that you have a lower or upper extremity arterial duplex. This test is an ultrasound of the arteries in the legs or arms. It looks at arterial blood flow in the legs and arms. Allow one hour for Lower and Upper Arterial scans. There are no restrictions or special instructions

## 2014-06-04 ENCOUNTER — Other Ambulatory Visit: Payer: Self-pay | Admitting: Oncology

## 2014-06-05 ENCOUNTER — Other Ambulatory Visit (HOSPITAL_BASED_OUTPATIENT_CLINIC_OR_DEPARTMENT_OTHER): Payer: Medicare Other

## 2014-06-05 ENCOUNTER — Ambulatory Visit (HOSPITAL_BASED_OUTPATIENT_CLINIC_OR_DEPARTMENT_OTHER): Payer: Medicare Other

## 2014-06-05 ENCOUNTER — Other Ambulatory Visit: Payer: Self-pay | Admitting: *Deleted

## 2014-06-05 VITALS — BP 149/57 | HR 61 | Temp 97.3°F

## 2014-06-05 DIAGNOSIS — D649 Anemia, unspecified: Secondary | ICD-10-CM

## 2014-06-05 DIAGNOSIS — N039 Chronic nephritic syndrome with unspecified morphologic changes: Principal | ICD-10-CM

## 2014-06-05 DIAGNOSIS — C9000 Multiple myeloma not having achieved remission: Secondary | ICD-10-CM

## 2014-06-05 DIAGNOSIS — D638 Anemia in other chronic diseases classified elsewhere: Secondary | ICD-10-CM

## 2014-06-05 DIAGNOSIS — N289 Disorder of kidney and ureter, unspecified: Secondary | ICD-10-CM

## 2014-06-05 DIAGNOSIS — D631 Anemia in chronic kidney disease: Secondary | ICD-10-CM

## 2014-06-05 LAB — COMPREHENSIVE METABOLIC PANEL (CC13)
ALK PHOS: 55 U/L (ref 40–150)
ALT: 15 U/L (ref 0–55)
AST: 20 U/L (ref 5–34)
Albumin: 3.5 g/dL (ref 3.5–5.0)
Anion Gap: 6 mEq/L (ref 3–11)
BUN: 20.2 mg/dL (ref 7.0–26.0)
CO2: 23 meq/L (ref 22–29)
Calcium: 9.1 mg/dL (ref 8.4–10.4)
Chloride: 95 mEq/L — ABNORMAL LOW (ref 98–109)
Creatinine: 1.1 mg/dL (ref 0.7–1.3)
Glucose: 116 mg/dl (ref 70–140)
Potassium: 5.2 mEq/L — ABNORMAL HIGH (ref 3.5–5.1)
SODIUM: 124 meq/L — AB (ref 136–145)
Total Bilirubin: 0.48 mg/dL (ref 0.20–1.20)
Total Protein: 7.1 g/dL (ref 6.4–8.3)

## 2014-06-05 LAB — CBC & DIFF AND RETIC
BASO%: 0.4 % (ref 0.0–2.0)
Basophils Absolute: 0 10*3/uL (ref 0.0–0.1)
EOS%: 1.6 % (ref 0.0–7.0)
Eosinophils Absolute: 0.1 10*3/uL (ref 0.0–0.5)
HCT: 28.4 % — ABNORMAL LOW (ref 38.4–49.9)
HGB: 9.4 g/dL — ABNORMAL LOW (ref 13.0–17.1)
Immature Retic Fract: 5.3 % (ref 3.00–10.60)
LYMPH%: 33.5 % (ref 14.0–49.0)
MCH: 28.3 pg (ref 27.2–33.4)
MCHC: 33.1 g/dL (ref 32.0–36.0)
MCV: 85.5 fL (ref 79.3–98.0)
MONO#: 0.7 10*3/uL (ref 0.1–0.9)
MONO%: 14.4 % — AB (ref 0.0–14.0)
NEUT#: 2.5 10*3/uL (ref 1.5–6.5)
NEUT%: 50.1 % (ref 39.0–75.0)
PLATELETS: 229 10*3/uL (ref 140–400)
RBC: 3.32 10*6/uL — ABNORMAL LOW (ref 4.20–5.82)
RDW: 16.5 % — ABNORMAL HIGH (ref 11.0–14.6)
Retic %: 2.28 % — ABNORMAL HIGH (ref 0.80–1.80)
Retic Ct Abs: 75.7 10*3/uL (ref 34.80–93.90)
WBC: 5 10*3/uL (ref 4.0–10.3)
lymph#: 1.7 10*3/uL (ref 0.9–3.3)

## 2014-06-05 MED ORDER — DARBEPOETIN ALFA-POLYSORBATE 200 MCG/0.4ML IJ SOLN
200.0000 ug | Freq: Once | INTRAMUSCULAR | Status: DC
  Administered 2014-06-05: 200 ug via SUBCUTANEOUS
  Filled 2014-06-05: qty 0.4

## 2014-06-08 ENCOUNTER — Ambulatory Visit: Payer: Medicare Other | Admitting: Nurse Practitioner

## 2014-06-09 LAB — KAPPA/LAMBDA LIGHT CHAINS
KAPPA FREE LGHT CHN: 59.5 mg/dL — AB (ref 0.33–1.94)
Kappa:Lambda Ratio: 24.49 — ABNORMAL HIGH (ref 0.26–1.65)
Lambda Free Lght Chn: 2.43 mg/dL (ref 0.57–2.63)

## 2014-06-10 ENCOUNTER — Other Ambulatory Visit: Payer: Medicare Other

## 2014-06-10 LAB — PROTEIN / CREATININE RATIO, URINE
Creatinine, Urine: 48.9 mg/dL
Protein Creatinine Ratio: 0.16 — ABNORMAL HIGH (ref <0.15)
Total Protein, Urine: 8 mg/dL

## 2014-06-18 ENCOUNTER — Other Ambulatory Visit: Payer: Self-pay | Admitting: Oncology

## 2014-06-19 ENCOUNTER — Other Ambulatory Visit (HOSPITAL_BASED_OUTPATIENT_CLINIC_OR_DEPARTMENT_OTHER): Payer: Medicare Other

## 2014-06-19 ENCOUNTER — Ambulatory Visit (HOSPITAL_BASED_OUTPATIENT_CLINIC_OR_DEPARTMENT_OTHER): Payer: Medicare Other

## 2014-06-19 VITALS — BP 130/61 | HR 70 | Temp 97.4°F

## 2014-06-19 DIAGNOSIS — D649 Anemia, unspecified: Secondary | ICD-10-CM

## 2014-06-19 DIAGNOSIS — N039 Chronic nephritic syndrome with unspecified morphologic changes: Principal | ICD-10-CM

## 2014-06-19 DIAGNOSIS — N289 Disorder of kidney and ureter, unspecified: Secondary | ICD-10-CM

## 2014-06-19 DIAGNOSIS — D631 Anemia in chronic kidney disease: Secondary | ICD-10-CM

## 2014-06-19 DIAGNOSIS — C9000 Multiple myeloma not having achieved remission: Secondary | ICD-10-CM

## 2014-06-19 DIAGNOSIS — N19 Unspecified kidney failure: Secondary | ICD-10-CM

## 2014-06-19 LAB — CBC & DIFF AND RETIC
BASO%: 0.5 % (ref 0.0–2.0)
Basophils Absolute: 0 10*3/uL (ref 0.0–0.1)
EOS%: 1.5 % (ref 0.0–7.0)
Eosinophils Absolute: 0.1 10*3/uL (ref 0.0–0.5)
HCT: 29.3 % — ABNORMAL LOW (ref 38.4–49.9)
HGB: 9.8 g/dL — ABNORMAL LOW (ref 13.0–17.1)
Immature Retic Fract: 3.5 % (ref 3.00–10.60)
LYMPH%: 35.9 % (ref 14.0–49.0)
MCH: 28.4 pg (ref 27.2–33.4)
MCHC: 33.4 g/dL (ref 32.0–36.0)
MCV: 84.9 fL (ref 79.3–98.0)
MONO#: 0.9 10*3/uL (ref 0.1–0.9)
MONO%: 13.8 % (ref 0.0–14.0)
NEUT#: 3 10*3/uL (ref 1.5–6.5)
NEUT%: 48.3 % (ref 39.0–75.0)
PLATELETS: 206 10*3/uL (ref 140–400)
RBC: 3.45 10*6/uL — AB (ref 4.20–5.82)
RDW: 17.1 % — ABNORMAL HIGH (ref 11.0–14.6)
RETIC %: 2.87 % — AB (ref 0.80–1.80)
Retic Ct Abs: 99.02 10*3/uL — ABNORMAL HIGH (ref 34.80–93.90)
WBC: 6.2 10*3/uL (ref 4.0–10.3)
lymph#: 2.2 10*3/uL (ref 0.9–3.3)

## 2014-06-19 MED ORDER — DARBEPOETIN ALFA-POLYSORBATE 200 MCG/0.4ML IJ SOLN
200.0000 ug | Freq: Once | INTRAMUSCULAR | Status: AC
  Administered 2014-06-19: 200 ug via SUBCUTANEOUS
  Filled 2014-06-19: qty 0.4

## 2014-06-22 ENCOUNTER — Telehealth: Payer: Self-pay | Admitting: *Deleted

## 2014-06-22 ENCOUNTER — Ambulatory Visit (INDEPENDENT_AMBULATORY_CARE_PROVIDER_SITE_OTHER): Payer: Medicare Other | Admitting: Nurse Practitioner

## 2014-06-22 ENCOUNTER — Encounter: Payer: Self-pay | Admitting: Nurse Practitioner

## 2014-06-22 ENCOUNTER — Other Ambulatory Visit: Payer: Self-pay | Admitting: Oncology

## 2014-06-22 ENCOUNTER — Other Ambulatory Visit: Payer: Self-pay | Admitting: *Deleted

## 2014-06-22 VITALS — BP 120/64 | HR 66 | Ht 70.0 in | Wt 186.4 lb

## 2014-06-22 DIAGNOSIS — I2589 Other forms of chronic ischemic heart disease: Secondary | ICD-10-CM

## 2014-06-22 DIAGNOSIS — I5022 Chronic systolic (congestive) heart failure: Secondary | ICD-10-CM

## 2014-06-22 DIAGNOSIS — I739 Peripheral vascular disease, unspecified: Secondary | ICD-10-CM

## 2014-06-22 DIAGNOSIS — I509 Heart failure, unspecified: Secondary | ICD-10-CM

## 2014-06-22 DIAGNOSIS — I2510 Atherosclerotic heart disease of native coronary artery without angina pectoris: Secondary | ICD-10-CM

## 2014-06-22 DIAGNOSIS — R0609 Other forms of dyspnea: Secondary | ICD-10-CM

## 2014-06-22 DIAGNOSIS — N289 Disorder of kidney and ureter, unspecified: Secondary | ICD-10-CM

## 2014-06-22 DIAGNOSIS — I255 Ischemic cardiomyopathy: Secondary | ICD-10-CM

## 2014-06-22 DIAGNOSIS — E871 Hypo-osmolality and hyponatremia: Secondary | ICD-10-CM

## 2014-06-22 DIAGNOSIS — R0600 Dyspnea, unspecified: Secondary | ICD-10-CM

## 2014-06-22 DIAGNOSIS — R0989 Other specified symptoms and signs involving the circulatory and respiratory systems: Secondary | ICD-10-CM

## 2014-06-22 DIAGNOSIS — I10 Essential (primary) hypertension: Secondary | ICD-10-CM

## 2014-06-22 LAB — BASIC METABOLIC PANEL
BUN: 17 mg/dL (ref 6–23)
CO2: 24 mEq/L (ref 19–32)
Calcium: 9 mg/dL (ref 8.4–10.5)
Chloride: 88 mEq/L — ABNORMAL LOW (ref 96–112)
Creatinine, Ser: 1.1 mg/dL (ref 0.4–1.5)
GFR: 65.75 mL/min (ref >60.00)
Glucose, Bld: 95 mg/dL (ref 70–99)
Potassium: 4.8 mEq/L (ref 3.5–5.1)
Sodium: 119 mEq/L — CL (ref 135–145)

## 2014-06-22 LAB — BRAIN NATRIURETIC PEPTIDE: Pro B Natriuretic peptide (BNP): 88 pg/mL (ref 0.0–100.0)

## 2014-06-22 MED ORDER — CARVEDILOL 6.25 MG PO TABS: 6.2500 mg | ORAL_TABLET | Freq: Two times a day (BID) | ORAL | Status: DC

## 2014-06-22 MED ORDER — ALLOPURINOL 300 MG PO TABS: 150.0000 mg | ORAL_TABLET | Freq: Every day | ORAL | Status: DC

## 2014-06-22 MED ORDER — ATORVASTATIN CALCIUM 10 MG PO TABS: 10.0000 mg | ORAL_TABLET | Freq: Every evening | ORAL | Status: DC

## 2014-06-22 NOTE — Addendum Note (Signed)
Addended by: Burtis Junes on: 06/22/2014 11:48 AM   Modules accepted: Orders

## 2014-06-22 NOTE — Telephone Encounter (Signed)
Timothy Merle NP aware of reported critical lab obtained on 8/3 of a sodium of 119.

## 2014-06-22 NOTE — Progress Notes (Signed)
Timothy [name] Date of Birth: 10/18/1928 Medical Record #[id number]  History of Present Illness: Timothy [name] is seen back today for a follow up visit. Seen for Dr. Martinique. He has a history of CAD, status post CABG in 2002, ischemic cardiomyopathy, chronic systolic CHF, multiple myeloma - followed closely by oncology, prior to amputations for osteomyelitis, CKD, HL, anemia, depression, neuropathy, gout. Last seen by Dr. Martinique 07/2013. He was given Lasix for one week due to some volume excess. Saw Richardson Dopp, [place] back in March of 2015. He is been followed by podiatry for left second toe ulcer as well as left heel ulcer. ABIs 01/2014 were abnormal with suggestion of significant inflow disease on the left by CFA wave forms and greater than 50% left SFA disease; distal right SFA with short area of occlusion versus calcific shadowing. He has seen Dr. Gwenlyn Found and Dr. Scot Dock and has had left common femoral endarterectomy and patch angioplasty.   Lexiscan Myoview (12/18/12): Inf and IL scar with minimal peri-infarct ischemia, EF 41%; Intermediate Risk. Med Rx continued.   Echo (07/18/13): EF 35-40%, inf AK (c/w scar), mod LAE, mild RVE.  Comes in today. Here with his wife. Doing ok.  Mostly limited by his balance. Has continued to have falls. Last fall about 6 weeks ago - hit his right side. Still with some pain with movement. Using his walker almost all the time. Short of breath - he does not feel like it has gotten worse. Pretty inactive. No chest pain. Swelling has improved. No more foot ulcers since his vascular surgery.   Current Outpatient Prescriptions  Medication Sig Dispense Refill  . allopurinol (ZYLOPRIM) 300 MG tablet Take 0.5 tablets (150 mg total) by mouth daily.  90 tablet  0  . aspirin EC 81 MG tablet Take 81 mg by mouth 3 (three) times a week. Monday, Wednesday, Friday      . atorvastatin (LIPITOR) 10 MG tablet Take 10 mg by mouth every evening.      . B Complex-Biotin-FA (B COMPLETE PO) Take 1  tablet by mouth daily.       . carvedilol (COREG) 6.25 MG tablet Take 6.25 mg by mouth 2 (two) times daily with a meal.      . Coenzyme Q10 (CO Q-10 PO) Take 1 tablet by mouth every morning.       . DULoxetine (CYMBALTA) 60 MG capsule Take 60 mg by mouth every evening.      Marland Kitchen HYDROmorphone (DILAUDID) 4 MG tablet Take 6 mg by mouth as needed for severe pain.      Marland Kitchen lisinopril (PRINIVIL,ZESTRIL) 20 MG tablet Take 20 mg by mouth daily.      . Multiple Vitamin (MULTIVITAMIN) capsule Take 1 capsule by mouth every morning.       . nitroGLYCERIN (NITROSTAT) 0.4 MG SL tablet Place 0.4 mg under the tongue every 5 (five) minutes as needed for chest pain.      . polyethylene glycol (MIRALAX / GLYCOLAX) packet Take 17 g by mouth daily as needed.  14 each  0  . tamsulosin (FLOMAX) 0.4 MG CAPS capsule Take 0.4 mg by mouth at bedtime.      Marland Kitchen tobramycin (TOBREX) 0.3 % ophthalmic solution Place 1 drop into both eyes daily as needed (for Bacterial eye infection).       . vitamin C (ASCORBIC ACID) 500 MG tablet Take 500 mg by mouth every morning.       No current facility-administered medications for this visit.  Allergies  Allergen Reactions  . Morphine And Related Other (See Comments)    Agitated with morphine drip    Past Medical History  Diagnosis Date  . ASCVD (arteriosclerotic cardiovascular disease)   . Anemia     chronic mild anemia  . Gout   . Hypertension   . Lumbar disc disease     post laminectomy  . Chronic back pain   . History of echocardiogram 11/02/2010    EF range of 30 to 35% / There is hypokinsesis of the basal-mild inferolateral myocardium  . CHF (congestive heart failure)   . Heart murmur   . Ischemic cardiomyopathy   . Renal insufficiency   . Peripheral neuropathy   . Inferior myocardial infarction 1986  . Hyperlipidemia   . SOB (shortness of breath)   . Fatigue   . Peripheral neuropathy   . Coronary artery disease     CABG 2002 by Dr. Roxy Manns with LIMA to LAD, SVG to  intermediate, SVG to LCX, & SVG to PL. Last nuclear in 2012 showing large inferior scar with EF of 39%.   . Blood transfusion     DEC 2012 - TWO UNITS  . Myeloma     Multiple myeloma, with recurrence of increasing problems  DR. Canadian -ONCOLOGIST.  PT HAS BEEN OFF CHEMO SINCE HIS HOSPITALIZATION FEB 2013 FOR LEFT FOOT INFECTION  . Cancer   . History of shingles MARCH 2009    SHINGLE LESIONS WERE AROUND RIGHT EYE--PT HAS RESIDUAL ITCHING AROUND THE EYE.  . Osteomyelitis     s/p toe amputation  . Chronic systolic heart failure     EF of 30% per echo 02/2012  . Protein malnutrition   . Critical lower limb ischemia     Past Surgical History  Procedure Laterality Date  . Laminectomy    . Inguinal hernia repair    . Cardiac catheterization  06/20/2000    Severe coronary disease (totally occluded right artery, 90% left circumflex, 50-60% intermediate, and 90-95% ostial left anterior descending)   . Tonsillectomy    . Coronary artery bypass graft  2002    x4 / Left internal mammary artery to the LAD.  / Saphenous vein graft to the right posterolateral.  /  Saphenous vein graft to  the ramus intermedius. / Saphenous vein graft to the circumflex marginal     . Amputation  03/06/2012    Procedure: AMPUTATION RAY;  Surgeon: Alta Corning, MD;  Location: [facility];  Service: Orthopedics;  Laterality: Left;  First Ray Amputation  . Intramedullary (im) nail intertrochanteric Right 08/17/2013    Procedure: INTRAMEDULLARY (IM) NAIL INTERTROCHANTRIC;  Surgeon: Marybelle Killings, MD;  Location: [facility];  Service: Orthopedics;  Laterality: Right;  . Endarterectomy femoral Left 03/24/2014    Procedure: Left Common Femoral  Artery Endarterectomy with Vein Patch Angioplasty,  Ultrasound ;  Surgeon: Angelia Mould, MD;  Location: [facility];  Service: Vascular;  Laterality: Left;  . Toe amputation      History  Smoking status  . Former Smoker -- 1.00 packs/day for 20 years  . Types: Cigarettes  . Quit date:  11/20/1984  Smokeless tobacco  . Never Used    History  Alcohol Use No    History reviewed. No pertinent family history.  Review of Systems: The review of systems is per the HPI.  All other systems were reviewed and are negative.  Physical Exam: BP 120/64  Pulse 66  Ht $R'5\' 10"'ux$  (1.778 m)  Wt 186 lb 6.4 oz (84.55 kg)  BMI 26.75 kg/m2  SpO2 98% Patient is very pleasant and in no acute distress. Looks chronically ill. Skin is warm and dry. Color is normal.  HEENT is unremarkable. Normocephalic/atraumatic. PERRL. Sclera are nonicteric. Neck is supple. No masses. No JVD. Lungs are clear. Cardiac exam shows a regular rate and rhythm. Abdomen is soft. Extremities are without edema. Gait and ROM are intact. No gross neurologic deficits noted.  Wt Readings from Last 3 Encounters:  06/22/14 186 lb 6.4 oz (84.55 kg)  05/29/14 187 lb 1.6 oz (84.868 kg)  05/27/14 189 lb 6.4 oz (85.911 kg)    LABORATORY DATA/PROCEDURES:  Lab Results  Component Value Date   WBC 6.2 06/19/2014   HGB 9.8* 06/19/2014   HCT 29.3* 06/19/2014   PLT 206 06/19/2014   GLUCOSE 116 06/05/2014   CHOL 102 08/13/2013   TRIG 75 08/13/2013   HDL 37* 08/13/2013   LDLCALC 50 08/13/2013   ALT 15 06/05/2014   AST 20 06/05/2014   NA 124* 06/05/2014   K 5.2* 06/05/2014   CL 92* 03/26/2014   CREATININE 1.1 06/05/2014   BUN 20.2 06/05/2014   CO2 23 06/05/2014   TSH 1.84 03/20/2012   PSA 3.75 05/30/2006   INR 1.15 03/23/2014    BNP (last 3 results) No results found for this basename: PROBNP,  in the last 8760 hours  Echo Study Conclusions from August 2014  - Left ventricle: The cavity size was mildly dilated. Wall thickness was normal. Systolic function was moderately reduced. The estimated ejection fraction was in the range of 35% to 40%. Akinesis of the basalinferior myocardium; consistent with infarction. There was an increased relative contribution of atrial contraction to ventricular filling. - Left atrium: The atrium was  moderately dilated. - Right ventricle: Systolic function was mildly reduced.   Assessment / Plan: 1. Ischemic CM - managed medically - no chest pain reported and currently looks compensated. Does endorse more shortness of breath - I suspect this is a reflection of deconditioning. No change in his medicines. Check BNP and BMET today. I will see him back in 4 months.   2. Multiple myeloma - followed by oncology with routine labs   3. HLD - on statin therapy   4. Falls - continues with balance issues - never seen by neurology that I can tell. He will discuss with Dr. [name] - this does now seem to be his most limiting factor.   Patient is agreeable to this plan and will call if any problems develop in the interim.   Burtis Junes, RN, [facility] [address] [place], [place]  [address] [phone]

## 2014-06-22 NOTE — Patient Instructions (Addendum)
I will see you in 4 months  I want to check your lab today - this will see what your fluid level test is and if we need diuretics  Stay on your current medicines  Talk to Dr. [name] about going to see neurology  Call the [place] office at [phone] if you have any questions, problems or concerns.

## 2014-06-24 ENCOUNTER — Other Ambulatory Visit: Payer: Self-pay | Admitting: *Deleted

## 2014-06-24 ENCOUNTER — Other Ambulatory Visit (INDEPENDENT_AMBULATORY_CARE_PROVIDER_SITE_OTHER): Payer: Medicare Other

## 2014-06-24 DIAGNOSIS — E871 Hypo-osmolality and hyponatremia: Secondary | ICD-10-CM

## 2014-06-24 DIAGNOSIS — R296 Repeated falls: Secondary | ICD-10-CM

## 2014-06-24 DIAGNOSIS — R2681 Unsteadiness on feet: Secondary | ICD-10-CM

## 2014-06-24 LAB — BASIC METABOLIC PANEL
BUN: 23 mg/dL (ref 6–23)
CO2: 25 mEq/L (ref 19–32)
Calcium: 9.2 mg/dL (ref 8.4–10.5)
Chloride: 95 mEq/L — ABNORMAL LOW (ref 96–112)
Creatinine, Ser: 1.4 mg/dL (ref 0.4–1.5)
GFR: 51.78 mL/min — ABNORMAL LOW (ref >60.00)
Glucose, Bld: 110 mg/dL — ABNORMAL HIGH (ref 70–99)
Potassium: 4.5 mEq/L (ref 3.5–5.1)
Sodium: 126 mEq/L — ABNORMAL LOW (ref 135–145)

## 2014-06-30 ENCOUNTER — Other Ambulatory Visit: Payer: Self-pay | Admitting: Internal Medicine

## 2014-06-30 ENCOUNTER — Ambulatory Visit (INDEPENDENT_AMBULATORY_CARE_PROVIDER_SITE_OTHER)
Admission: RE | Admit: 2014-06-30 | Discharge: 2014-06-30 | Disposition: A | Discharge status: Home / Self Care | Payer: Medicare Other | Source: Ambulatory Visit | Attending: Nurse Practitioner | Admitting: Nurse Practitioner

## 2014-06-30 DIAGNOSIS — Z9181 History of falling: Secondary | ICD-10-CM

## 2014-06-30 DIAGNOSIS — R296 Repeated falls: Secondary | ICD-10-CM

## 2014-06-30 DIAGNOSIS — R2681 Unsteadiness on feet: Secondary | ICD-10-CM

## 2014-06-30 DIAGNOSIS — R269 Unspecified abnormalities of gait and mobility: Secondary | ICD-10-CM

## 2014-07-03 ENCOUNTER — Ambulatory Visit (HOSPITAL_BASED_OUTPATIENT_CLINIC_OR_DEPARTMENT_OTHER): Payer: Medicare Other

## 2014-07-03 ENCOUNTER — Other Ambulatory Visit (HOSPITAL_BASED_OUTPATIENT_CLINIC_OR_DEPARTMENT_OTHER): Payer: Medicare Other

## 2014-07-03 VITALS — BP 138/68 | HR 71 | Temp 97.3°F

## 2014-07-03 DIAGNOSIS — C9000 Multiple myeloma not having achieved remission: Secondary | ICD-10-CM

## 2014-07-03 DIAGNOSIS — N289 Disorder of kidney and ureter, unspecified: Secondary | ICD-10-CM

## 2014-07-03 DIAGNOSIS — D649 Anemia, unspecified: Secondary | ICD-10-CM

## 2014-07-03 DIAGNOSIS — N039 Chronic nephritic syndrome with unspecified morphologic changes: Principal | ICD-10-CM

## 2014-07-03 DIAGNOSIS — D631 Anemia in chronic kidney disease: Secondary | ICD-10-CM

## 2014-07-03 LAB — COMPREHENSIVE METABOLIC PANEL (CC13)
ALBUMIN: 3.6 g/dL (ref 3.5–5.0)
ALK PHOS: 56 U/L (ref 40–150)
ALT: 12 U/L (ref 0–55)
AST: 22 U/L (ref 5–34)
Anion Gap: 6 mEq/L (ref 3–11)
BUN: 14.9 mg/dL (ref 7.0–26.0)
CO2: 24 mEq/L (ref 22–29)
Calcium: 9.2 mg/dL (ref 8.4–10.4)
Chloride: 92 mEq/L — ABNORMAL LOW (ref 98–109)
Creatinine: 1.1 mg/dL (ref 0.7–1.3)
Glucose: 103 mg/dl (ref 70–140)
POTASSIUM: 4.7 meq/L (ref 3.5–5.1)
SODIUM: 121 meq/L — AB (ref 136–145)
TOTAL PROTEIN: 7 g/dL (ref 6.4–8.3)
Total Bilirubin: 0.53 mg/dL (ref 0.20–1.20)

## 2014-07-03 LAB — CBC & DIFF AND RETIC
BASO%: 0.3 % (ref 0.0–2.0)
Basophils Absolute: 0 10*3/uL (ref 0.0–0.1)
EOS ABS: 0.1 10*3/uL (ref 0.0–0.5)
EOS%: 2 % (ref 0.0–7.0)
HCT: 30.4 % — ABNORMAL LOW (ref 38.4–49.9)
HGB: 10.1 g/dL — ABNORMAL LOW (ref 13.0–17.1)
IMMATURE RETIC FRACT: 4.6 % (ref 3.00–10.60)
LYMPH#: 1.7 10*3/uL (ref 0.9–3.3)
LYMPH%: 28.6 % (ref 14.0–49.0)
MCH: 28.4 pg (ref 27.2–33.4)
MCHC: 33.2 g/dL (ref 32.0–36.0)
MCV: 85.4 fL (ref 79.3–98.0)
MONO#: 0.7 10*3/uL (ref 0.1–0.9)
MONO%: 11.8 % (ref 0.0–14.0)
NEUT%: 57.3 % (ref 39.0–75.0)
NEUTROS ABS: 3.5 10*3/uL (ref 1.5–6.5)
PLATELETS: 198 10*3/uL (ref 140–400)
RBC: 3.56 10*6/uL — ABNORMAL LOW (ref 4.20–5.82)
RDW: 17.3 % — ABNORMAL HIGH (ref 11.0–14.6)
Retic %: 2.44 % — ABNORMAL HIGH (ref 0.80–1.80)
Retic Ct Abs: 86.86 10*3/uL (ref 34.80–93.90)
WBC: 6.1 10*3/uL (ref 4.0–10.3)

## 2014-07-03 LAB — PROTEIN / CREATININE RATIO, URINE
Creatinine, Urine: 27.5 mg/dL
PROTEIN CREATININE RATIO: 0.15 — AB (ref <0.15)
Total Protein, Urine: 4 mg/dL

## 2014-07-03 MED ORDER — DARBEPOETIN ALFA-POLYSORBATE 200 MCG/0.4ML IJ SOLN
200.0000 ug | Freq: Once | INTRAMUSCULAR | Status: AC
  Administered 2014-07-03: 200 ug via SUBCUTANEOUS
  Filled 2014-07-03: qty 0.4

## 2014-07-06 ENCOUNTER — Inpatient Hospital Stay (HOSPITAL_COMMUNITY): Payer: Medicare Other

## 2014-07-06 ENCOUNTER — Inpatient Hospital Stay (HOSPITAL_COMMUNITY)
Admission: EM | Admit: 2014-07-06 | Discharge: 2014-07-10 | DRG: 644 | Disposition: A | Discharge status: Skilled Nursing Facility | Payer: Medicare Other | Attending: Internal Medicine | Admitting: Internal Medicine

## 2014-07-06 ENCOUNTER — Emergency Department (HOSPITAL_COMMUNITY): Payer: Medicare Other

## 2014-07-06 ENCOUNTER — Encounter (HOSPITAL_COMMUNITY): Payer: Self-pay | Admitting: Emergency Medicine

## 2014-07-06 DIAGNOSIS — I2510 Atherosclerotic heart disease of native coronary artery without angina pectoris: Secondary | ICD-10-CM | POA: Diagnosis present

## 2014-07-06 DIAGNOSIS — M545 Low back pain, unspecified: Secondary | ICD-10-CM | POA: Diagnosis present

## 2014-07-06 DIAGNOSIS — E039 Hypothyroidism, unspecified: Secondary | ICD-10-CM | POA: Diagnosis present

## 2014-07-06 DIAGNOSIS — R339 Retention of urine, unspecified: Secondary | ICD-10-CM | POA: Diagnosis present

## 2014-07-06 DIAGNOSIS — M62838 Other muscle spasm: Secondary | ICD-10-CM | POA: Diagnosis present

## 2014-07-06 DIAGNOSIS — Y92009 Unspecified place in unspecified non-institutional (private) residence as the place of occurrence of the external cause: Secondary | ICD-10-CM

## 2014-07-06 DIAGNOSIS — Z9181 History of falling: Secondary | ICD-10-CM

## 2014-07-06 DIAGNOSIS — R52 Pain, unspecified: Secondary | ICD-10-CM | POA: Diagnosis present

## 2014-07-06 DIAGNOSIS — M25551 Pain in right hip: Secondary | ICD-10-CM

## 2014-07-06 DIAGNOSIS — E871 Hypo-osmolality and hyponatremia: Secondary | ICD-10-CM

## 2014-07-06 DIAGNOSIS — I252 Old myocardial infarction: Secondary | ICD-10-CM

## 2014-07-06 DIAGNOSIS — Z79899 Other long term (current) drug therapy: Secondary | ICD-10-CM

## 2014-07-06 DIAGNOSIS — S7000XA Contusion of unspecified hip, initial encounter: Secondary | ICD-10-CM | POA: Diagnosis present

## 2014-07-06 DIAGNOSIS — S98139A Complete traumatic amputation of one unspecified lesser toe, initial encounter: Secondary | ICD-10-CM | POA: Diagnosis not present

## 2014-07-06 DIAGNOSIS — Z885 Allergy status to narcotic agent status: Secondary | ICD-10-CM

## 2014-07-06 DIAGNOSIS — D638 Anemia in other chronic diseases classified elsewhere: Secondary | ICD-10-CM | POA: Diagnosis present

## 2014-07-06 DIAGNOSIS — D62 Acute posthemorrhagic anemia: Secondary | ICD-10-CM

## 2014-07-06 DIAGNOSIS — M25559 Pain in unspecified hip: Secondary | ICD-10-CM

## 2014-07-06 DIAGNOSIS — N401 Enlarged prostate with lower urinary tract symptoms: Secondary | ICD-10-CM | POA: Diagnosis present

## 2014-07-06 DIAGNOSIS — G8929 Other chronic pain: Secondary | ICD-10-CM | POA: Diagnosis present

## 2014-07-06 DIAGNOSIS — I10 Essential (primary) hypertension: Secondary | ICD-10-CM | POA: Diagnosis present

## 2014-07-06 DIAGNOSIS — Z87891 Personal history of nicotine dependence: Secondary | ICD-10-CM

## 2014-07-06 DIAGNOSIS — C9000 Multiple myeloma not having achieved remission: Secondary | ICD-10-CM

## 2014-07-06 DIAGNOSIS — I2589 Other forms of chronic ischemic heart disease: Secondary | ICD-10-CM | POA: Diagnosis present

## 2014-07-06 DIAGNOSIS — E236 Other disorders of pituitary gland: Principal | ICD-10-CM | POA: Diagnosis present

## 2014-07-06 DIAGNOSIS — I5022 Chronic systolic (congestive) heart failure: Secondary | ICD-10-CM

## 2014-07-06 DIAGNOSIS — Z951 Presence of aortocoronary bypass graft: Secondary | ICD-10-CM | POA: Diagnosis not present

## 2014-07-06 DIAGNOSIS — R262 Difficulty in walking, not elsewhere classified: Secondary | ICD-10-CM

## 2014-07-06 DIAGNOSIS — Z8619 Personal history of other infectious and parasitic diseases: Secondary | ICD-10-CM | POA: Diagnosis not present

## 2014-07-06 DIAGNOSIS — M109 Gout, unspecified: Secondary | ICD-10-CM | POA: Diagnosis present

## 2014-07-06 DIAGNOSIS — E876 Hypokalemia: Secondary | ICD-10-CM

## 2014-07-06 DIAGNOSIS — I739 Peripheral vascular disease, unspecified: Secondary | ICD-10-CM | POA: Diagnosis present

## 2014-07-06 DIAGNOSIS — E785 Hyperlipidemia, unspecified: Secondary | ICD-10-CM | POA: Diagnosis present

## 2014-07-06 DIAGNOSIS — Z7982 Long term (current) use of aspirin: Secondary | ICD-10-CM

## 2014-07-06 DIAGNOSIS — G609 Hereditary and idiopathic neuropathy, unspecified: Secondary | ICD-10-CM | POA: Diagnosis present

## 2014-07-06 DIAGNOSIS — I5023 Acute on chronic systolic (congestive) heart failure: Secondary | ICD-10-CM

## 2014-07-06 DIAGNOSIS — R296 Repeated falls: Secondary | ICD-10-CM

## 2014-07-06 DIAGNOSIS — W19XXXA Unspecified fall, initial encounter: Secondary | ICD-10-CM | POA: Diagnosis present

## 2014-07-06 DIAGNOSIS — D649 Anemia, unspecified: Secondary | ICD-10-CM | POA: Diagnosis present

## 2014-07-06 DIAGNOSIS — N138 Other obstructive and reflux uropathy: Secondary | ICD-10-CM | POA: Diagnosis present

## 2014-07-06 DIAGNOSIS — I255 Ischemic cardiomyopathy: Secondary | ICD-10-CM

## 2014-07-06 DIAGNOSIS — R911 Solitary pulmonary nodule: Secondary | ICD-10-CM | POA: Diagnosis present

## 2014-07-06 DIAGNOSIS — I509 Heart failure, unspecified: Secondary | ICD-10-CM | POA: Diagnosis present

## 2014-07-06 LAB — URINE MICROSCOPIC-ADD ON

## 2014-07-06 LAB — URINALYSIS, ROUTINE W REFLEX MICROSCOPIC
Bilirubin Urine: NEGATIVE
Glucose, UA: NEGATIVE mg/dL
Hgb urine dipstick: NEGATIVE
Ketones, ur: NEGATIVE mg/dL
Leukocytes, UA: NEGATIVE
NITRITE: NEGATIVE
PROTEIN: 30 mg/dL — AB
Specific Gravity, Urine: 1.015 (ref 1.005–1.030)
UROBILINOGEN UA: 1 mg/dL (ref 0.0–1.0)
pH: 7 (ref 5.0–8.0)

## 2014-07-06 LAB — CREATININE, URINE, RANDOM: CREATININE, URINE: 80.07 mg/dL

## 2014-07-06 LAB — CBC WITH DIFFERENTIAL/PLATELET
Basophils Absolute: 0 10*3/uL (ref 0.0–0.1)
Basophils Relative: 0 % (ref 0–1)
Eosinophils Absolute: 0.1 10*3/uL (ref 0.0–0.7)
Eosinophils Relative: 1 % (ref 0–5)
HCT: 29.5 % — ABNORMAL LOW (ref 39.0–52.0)
HEMOGLOBIN: 10.2 g/dL — AB (ref 13.0–17.0)
LYMPHS PCT: 19 % (ref 12–46)
Lymphs Abs: 1.6 10*3/uL (ref 0.7–4.0)
MCH: 28.6 pg (ref 26.0–34.0)
MCHC: 34.6 g/dL (ref 30.0–36.0)
MCV: 82.6 fL (ref 78.0–100.0)
Monocytes Absolute: 0.9 10*3/uL (ref 0.1–1.0)
Monocytes Relative: 11 % (ref 3–12)
Neutro Abs: 5.6 10*3/uL (ref 1.7–7.7)
Neutrophils Relative %: 69 % (ref 43–77)
PLATELETS: 174 10*3/uL (ref 150–400)
RBC: 3.57 MIL/uL — AB (ref 4.22–5.81)
RDW: 16.9 % — ABNORMAL HIGH (ref 11.5–15.5)
WBC: 8.2 10*3/uL (ref 4.0–10.5)

## 2014-07-06 LAB — KAPPA/LAMBDA LIGHT CHAINS
KAPPA LAMBDA RATIO: 26.07 — AB (ref 0.26–1.65)
Kappa free light chain: 61 mg/dL — ABNORMAL HIGH (ref 0.33–1.94)
Lambda Free Lght Chn: 2.34 mg/dL (ref 0.57–2.63)

## 2014-07-06 LAB — BASIC METABOLIC PANEL
Anion gap: 12 (ref 5–15)
BUN: 15 mg/dL (ref 6–23)
CHLORIDE: 85 meq/L — AB (ref 96–112)
CO2: 23 mEq/L (ref 19–32)
Calcium: 9.2 mg/dL (ref 8.4–10.5)
Creatinine, Ser: 0.92 mg/dL (ref 0.50–1.35)
GFR calc Af Amer: 88 mL/min — ABNORMAL LOW (ref >90)
GFR, EST NON AFRICAN AMERICAN: 76 mL/min — AB (ref >90)
GLUCOSE: 127 mg/dL — AB (ref 70–99)
POTASSIUM: 4.8 meq/L (ref 3.7–5.3)
SODIUM: 120 meq/L — AB (ref 137–147)

## 2014-07-06 LAB — TSH: TSH: 4.26 u[IU]/mL (ref 0.350–4.500)

## 2014-07-06 LAB — SODIUM, URINE, RANDOM: SODIUM UR: 77 meq/L

## 2014-07-06 LAB — URIC ACID: URIC ACID, SERUM: 3.2 mg/dL — AB (ref 4.0–7.8)

## 2014-07-06 LAB — OSMOLALITY: OSMOLALITY: 267 mosm/kg — AB (ref 275–300)

## 2014-07-06 LAB — SODIUM: SODIUM: 120 meq/L — AB (ref 137–147)

## 2014-07-06 MED ORDER — ATORVASTATIN CALCIUM 10 MG PO TABS
10.0000 mg | ORAL_TABLET | Freq: Every evening | ORAL | Status: DC
  Administered 2014-07-06 – 2014-07-09 (×4): 10 mg via ORAL
  Filled 2014-07-06 (×5): qty 1

## 2014-07-06 MED ORDER — IOHEXOL 300 MG/ML  SOLN: 50.0000 mL | Freq: Once | INTRAMUSCULAR | Status: AC | PRN

## 2014-07-06 MED ORDER — TAMSULOSIN HCL 0.4 MG PO CAPS
0.4000 mg | ORAL_CAPSULE | Freq: Every day | ORAL | Status: DC
  Administered 2014-07-06 – 2014-07-09 (×4): 0.4 mg via ORAL
  Filled 2014-07-06 (×5): qty 1

## 2014-07-06 MED ORDER — B COMPLETE PO TABS: 1.0000 | ORAL_TABLET | Freq: Every day | ORAL | Status: DC

## 2014-07-06 MED ORDER — POLYETHYLENE GLYCOL 3350 17 G PO PACK
17.0000 g | PACK | Freq: Every day | ORAL | Status: DC | PRN
  Administered 2014-07-08: 17 g via ORAL
  Filled 2014-07-06 (×2): qty 1

## 2014-07-06 MED ORDER — ASPIRIN EC 81 MG PO TBEC
81.0000 mg | DELAYED_RELEASE_TABLET | ORAL | Status: DC
  Administered 2014-07-06 – 2014-07-10 (×3): 81 mg via ORAL
  Filled 2014-07-06 (×4): qty 1

## 2014-07-06 MED ORDER — ONDANSETRON HCL 4 MG/2ML IJ SOLN
4.0000 mg | Freq: Four times a day (QID) | INTRAMUSCULAR | Status: DC | PRN
Start: 2014-07-06 — End: 2014-07-10

## 2014-07-06 MED ORDER — MULTIVITAMINS PO CAPS: 1.0000 | ORAL_CAPSULE | Freq: Every morning | ORAL | Status: DC

## 2014-07-06 MED ORDER — B COMPLEX-C PO TABS
1.0000 | ORAL_TABLET | Freq: Every day | ORAL | Status: DC
  Administered 2014-07-07 – 2014-07-10 (×4): 1 via ORAL
  Filled 2014-07-06 (×4): qty 1

## 2014-07-06 MED ORDER — SODIUM CHLORIDE 0.9 % IV SOLN
INTRAVENOUS | Status: DC
  Administered 2014-07-06: 11:00:00 via INTRAVENOUS

## 2014-07-06 MED ORDER — ADULT MULTIVITAMIN W/MINERALS CH
1.0000 | ORAL_TABLET | Freq: Every day | ORAL | Status: DC
  Administered 2014-07-07 – 2014-07-10 (×4): 1 via ORAL
  Filled 2014-07-06 (×4): qty 1

## 2014-07-06 MED ORDER — ONDANSETRON HCL 4 MG/2ML IJ SOLN: 4.0000 mg | Freq: Three times a day (TID) | INTRAMUSCULAR | Status: DC | PRN

## 2014-07-06 MED ORDER — VITAMIN C 500 MG PO TABS
500.0000 mg | ORAL_TABLET | Freq: Every morning | ORAL | Status: DC
  Administered 2014-07-07 – 2014-07-10 (×4): 500 mg via ORAL
  Filled 2014-07-06 (×4): qty 1

## 2014-07-06 MED ORDER — TOBRAMYCIN 0.3 % OP SOLN: 1.0000 [drp] | Freq: Every day | OPHTHALMIC | Status: DC | PRN

## 2014-07-06 MED ORDER — ONDANSETRON HCL 4 MG PO TABS
4.0000 mg | ORAL_TABLET | Freq: Four times a day (QID) | ORAL | Status: DC | PRN
Start: 2014-07-06 — End: 2014-07-10

## 2014-07-06 MED ORDER — FENTANYL CITRATE 0.05 MG/ML IJ SOLN
50.0000 ug | Freq: Once | INTRAMUSCULAR | Status: AC
  Administered 2014-07-06: 50 ug via INTRAVENOUS
  Filled 2014-07-06: qty 2

## 2014-07-06 MED ORDER — CO Q-10 30 MG PO CAPS: 1.0000 | ORAL_CAPSULE | Freq: Every day | ORAL | Status: DC

## 2014-07-06 MED ORDER — ENOXAPARIN SODIUM 40 MG/0.4ML ~~LOC~~ SOLN
40.0000 mg | SUBCUTANEOUS | Status: DC
  Administered 2014-07-06 – 2014-07-09 (×4): 40 mg via SUBCUTANEOUS
  Filled 2014-07-06 (×5): qty 0.4

## 2014-07-06 MED ORDER — LISINOPRIL 20 MG PO TABS
20.0000 mg | ORAL_TABLET | Freq: Every day | ORAL | Status: DC
  Administered 2014-07-07 – 2014-07-10 (×4): 20 mg via ORAL
  Filled 2014-07-06 (×5): qty 1

## 2014-07-06 MED ORDER — ACETAMINOPHEN 325 MG PO TABS
650.0000 mg | ORAL_TABLET | Freq: Four times a day (QID) | ORAL | Status: DC | PRN
Start: 2014-07-06 — End: 2014-07-10

## 2014-07-06 MED ORDER — NITROGLYCERIN 0.4 MG SL SUBL: 0.4000 mg | SUBLINGUAL_TABLET | SUBLINGUAL | Status: DC | PRN

## 2014-07-06 MED ORDER — ALLOPURINOL 150 MG HALF TABLET
150.0000 mg | ORAL_TABLET | Freq: Every day | ORAL | Status: DC
  Administered 2014-07-07 – 2014-07-10 (×4): 150 mg via ORAL
  Filled 2014-07-06 (×4): qty 1

## 2014-07-06 MED ORDER — SODIUM CHLORIDE 0.9 % IJ SOLN
3.0000 mL | Freq: Two times a day (BID) | INTRAMUSCULAR | Status: DC
  Administered 2014-07-06 – 2014-07-10 (×8): 3 mL via INTRAVENOUS

## 2014-07-06 MED ORDER — HYDROMORPHONE HCL PF 2 MG/ML IJ SOLN
2.0000 mg | INTRAMUSCULAR | Status: DC | PRN
  Administered 2014-07-06 – 2014-07-07 (×2): 2 mg via INTRAVENOUS
  Filled 2014-07-06 (×2): qty 1

## 2014-07-06 MED ORDER — DULOXETINE HCL 60 MG PO CPEP
60.0000 mg | ORAL_CAPSULE | Freq: Every evening | ORAL | Status: DC
  Filled 2014-07-06: qty 1

## 2014-07-06 MED ORDER — ACETAMINOPHEN 650 MG RE SUPP
650.0000 mg | Freq: Four times a day (QID) | RECTAL | Status: DC | PRN
Start: 2014-07-06 — End: 2014-07-10

## 2014-07-06 MED ORDER — CARVEDILOL 6.25 MG PO TABS
6.2500 mg | ORAL_TABLET | Freq: Two times a day (BID) | ORAL | Status: DC
  Administered 2014-07-06 – 2014-07-10 (×8): 6.25 mg via ORAL
  Filled 2014-07-06 (×10): qty 1

## 2014-07-06 NOTE — ED Notes (Signed)
Bed: WA23 Expected date:  Expected time:  Means of arrival:  Comments: 

## 2014-07-06 NOTE — ED Provider Notes (Signed)
CSN: [id number]     Arrival date & time 07/06/14  3244 History   First MD Initiated Contact with Patient 07/06/14 [phone]     Chief Complaint  Patient presents with  . Fall      HPI Pt reports falling last night. Pt c/o of pain in right hip and leg. Pt hx of right femur fracture last September. Pt unsure of what caused fall. States he was walking with walker and just fell. Denies loss of consciousness. Pt reports possible hit to the head (no bruising or other trauma visible). Pt states he is now unable to walk or stand. Denies numbness in right leg. No acute distress, respirations equal and unlabored, skin warm and dry.   Past Medical History  Diagnosis Date  . ASCVD (arteriosclerotic cardiovascular disease)   . Anemia     chronic mild anemia  . Gout   . Hypertension   . Lumbar disc disease     post laminectomy  . Chronic back pain   . History of echocardiogram 11/02/2010    EF range of 30 to 35% / There is hypokinsesis of the basal-mild inferolateral myocardium  . CHF (congestive heart failure)   . Heart murmur   . Ischemic cardiomyopathy   . Renal insufficiency   . Peripheral neuropathy   . Inferior myocardial infarction 1986  . Hyperlipidemia   . SOB (shortness of breath)   . Fatigue   . Peripheral neuropathy   . Coronary artery disease     CABG 2002 by Dr. Roxy Manns with LIMA to LAD, SVG to intermediate, SVG to LCX, & SVG to PL. Last nuclear in 2012 showing large inferior scar with EF of 39%.   . Blood transfusion     DEC 2012 - TWO UNITS  . Myeloma     Multiple myeloma, with recurrence of increasing problems  DR. Simpson -ONCOLOGIST.  PT HAS BEEN OFF CHEMO SINCE HIS HOSPITALIZATION FEB 2013 FOR LEFT FOOT INFECTION  . Cancer   . History of shingles MARCH 2009    SHINGLE LESIONS WERE AROUND RIGHT EYE--PT HAS RESIDUAL ITCHING AROUND THE EYE.  . Osteomyelitis     s/p toe amputation  . Chronic systolic heart failure     EF of 30% per echo 02/2012  . Protein malnutrition   .  Critical lower limb ischemia    Past Surgical History  Procedure Laterality Date  . Laminectomy    . Inguinal hernia repair    . Cardiac catheterization  06/20/2000    Severe coronary disease (totally occluded right artery, 90% left circumflex, 50-60% intermediate, and 90-95% ostial left anterior descending)   . Tonsillectomy    . Coronary artery bypass graft  2002    x4 / Left internal mammary artery to the LAD.  / Saphenous vein graft to the right posterolateral.  /  Saphenous vein graft to  the ramus intermedius. / Saphenous vein graft to the circumflex marginal     . Amputation  03/06/2012    Procedure: AMPUTATION RAY;  Surgeon: Alta Corning, MD;  Location: [facility];  Service: Orthopedics;  Laterality: Left;  First Ray Amputation  . Intramedullary (im) nail intertrochanteric Right 08/17/2013    Procedure: INTRAMEDULLARY (IM) NAIL INTERTROCHANTRIC;  Surgeon: Marybelle Killings, MD;  Location: [facility];  Service: Orthopedics;  Laterality: Right;  . Endarterectomy femoral Left 03/24/2014    Procedure: Left Common Femoral  Artery Endarterectomy with Vein Patch Angioplasty,  Ultrasound ;  Surgeon: Angelia Mould, MD;  Location: [facility];  Service: Vascular;  Laterality: Left;  . Toe amputation     History reviewed. No pertinent family history. History  Substance Use Topics  . Smoking status: Former Smoker -- 1.00 packs/day for 20 years    Types: Cigarettes    Quit date: 11/20/1984  . Smokeless tobacco: Never Used  . Alcohol Use: No    Review of Systems  All other systems reviewed and are negative  Allergies  Morphine and related  Home Medications   Prior to Admission medications   Medication Sig Start Date End Date Taking? Authorizing Provider  allopurinol (ZYLOPRIM) 300 MG tablet Take 0.5 tablets (150 mg total) by mouth daily. 06/22/14  Yes Burtis Junes, NP  aspirin EC 81 MG tablet Take 81 mg by mouth 3 (three) times a week. Monday, Wednesday, Friday   Yes Historical Provider, MD    atorvastatin (LIPITOR) 10 MG tablet Take 1 tablet (10 mg total) by mouth every evening. 06/22/14  Yes Burtis Junes, NP  B Complex-Biotin-FA (B COMPLETE PO) Take 1 tablet by mouth daily.    Yes Historical Provider, MD  carvedilol (COREG) 6.25 MG tablet Take 1 tablet (6.25 mg total) by mouth 2 (two) times daily with a meal. 06/22/14  Yes Burtis Junes, NP  Coenzyme Q10 (CO Q-10 PO) Take 1 tablet by mouth every morning.    Yes Historical Provider, MD  DULoxetine (CYMBALTA) 60 MG capsule Take 60 mg by mouth every evening. 02/16/14  Yes [name], MD  HYDROmorphone (DILAUDID) 4 MG tablet Take 8 mg by mouth as needed for severe pain.  05/27/14  Yes [name], MD  lisinopril (PRINIVIL,ZESTRIL) 20 MG tablet Take 20 mg by mouth daily.   Yes Historical Provider, MD  Multiple Vitamin (MULTIVITAMIN) capsule Take 1 capsule by mouth every morning.    Yes Historical Provider, MD  nitroGLYCERIN (NITROSTAT) 0.4 MG SL tablet Place 0.4 mg under the tongue every 5 (five) minutes as needed for chest pain.   Yes Historical Provider, MD  polyethylene glycol (MIRALAX / GLYCOLAX) packet Take 17 g by mouth daily as needed. 09/05/13  Yes Ivan Anchors Love, PA-C  tamsulosin (FLOMAX) 0.4 MG CAPS capsule Take 0.4 mg by mouth at bedtime.   Yes Historical Provider, MD  tobramycin (TOBREX) 0.3 % ophthalmic solution Place 1 drop into both eyes daily as needed (for Bacterial eye infection).  01/25/14  Yes Historical Provider, MD  vitamin C (ASCORBIC ACID) 500 MG tablet Take 500 mg by mouth every morning.   Yes Historical Provider, MD   BP 164/72  Pulse 73  Temp(Src) 97.9 F (36.6 C) (Oral)  Resp 20  Ht $R'5\' 10"'jp$  (1.778 m)  Wt 180 lb (81.647 kg)  BMI 25.83 kg/m2  SpO2 96% Physical Exam  Musculoskeletal:       Right hip: He exhibits decreased range of motion, tenderness and bony tenderness.       Legs:  Physical Exam  Nursing note and vitals reviewed. Constitutional: He is oriented to person, place, and time. He  appears well-developed and well-nourished. No distress.  HENT:  Head: Normocephalic and atraumatic.  Eyes: Pupils are equal, round, and reactive to light.  Neck: Normal range of motion.  Cardiovascular: Normal rate and intact distal pulses.   Pulmonary/Chest: No respiratory distress.  Abdominal: Normal appearance. He exhibits no distension.  Neurological: He is alert and oriented to person, place, and time. No cranial nerve deficit.  Skin: Skin is warm and dry. No  rash noted.  Psychiatric: He has a normal mood and affect. His behavior is normal.   ED Course  Procedures (including critical care time) Labs Review Labs Reviewed  BASIC METABOLIC PANEL - Abnormal; Notable for the following:    Sodium 120 (*)    Chloride 85 (*)    Glucose, Bld 127 (*)    GFR calc non Af Amer 76 (*)    GFR calc Af Amer 88 (*)    All other components within normal limits  CBC WITH DIFFERENTIAL - Abnormal; Notable for the following:    RBC 3.57 (*)    Hemoglobin 10.2 (*)    HCT 29.5 (*)    RDW 16.9 (*)    All other components within normal limits  SODIUM, URINE, RANDOM  CREATININE, URINE, RANDOM  OSMOLALITY, URINE  URINALYSIS, ROUTINE W REFLEX MICROSCOPIC    Imaging Review Dg Chest 2 View  07/06/2014   CLINICAL DATA:  Chest pain; recent trauma ; history of multiple myeloma  EXAM: CHEST  2 VIEW  COMPARISON:  February 04, 2014  FINDINGS: There is a nondisplaced fracture of the anterolateral right seventh rib. No pneumothorax or effusion.  There is a questionable nodular opacity in the left lower lobe measuring 1.5 x 1.5 cm. There is mild scarring in the left base. Elsewhere lungs are clear.  Heart is mildly enlarged with pulmonary vascularity within normal limits. Patient is status post coronary artery bypass grafting. No adenopathy.  IMPRESSION: 1.5 x 1.5 cm questionable nodular opacity left lower lobe seen only on frontal view. Advise noncontrast enhanced chest CT to further assess.  Nondisplaced fracture  anterolateral right seventh rib. No pneumothorax.  No edema or consolidation.  Mild scarring left base.   Electronically Signed   By: Lowella Grip M.D.   On: 07/06/2014 09:45   Dg Hip Complete Right  07/06/2014   CLINICAL DATA:  Pain post trauma ; history of multiple myeloma  EXAM: RIGHT HIP - COMPLETE 2+ VIEW  COMPARISON:  August 17, 2013  FINDINGS: Frontal pelvis as well as frontal and lateral right hip images were obtained. There is postoperative change in the proximal right femur. No acute fracture or dislocation is appreciable. There is moderate symmetric narrowing of both hip joints. There is no apparent blastic or lytic bone lesion. There is myositis ossificans in the area of previous trauma on the right. There is lower lumbar levoscoliosis. There are multiple foci of arterial vascular calcification.  IMPRESSION: Postoperative change proximal right femur. There is myositis ossificans in this area consistent with old trauma. No acute fracture or dislocation. Moderate symmetric narrowing of both hip joints.   Electronically Signed   By: Lowella Grip M.D.   On: 07/06/2014 09:41      MDM   Final diagnoses:  Hyponatremia  Fall, initial encounter  Unable to walk        Dot Lanes, MD 07/06/14 1026

## 2014-07-06 NOTE — Progress Notes (Signed)
CRITICAL VALUE ALERT  Critical value received:  Na 120  Date of notification:  07/06/2014  Time of notification:  1950  Critical value read back:Yes.    Nurse who received alert:  Daleen Bo RN  MD notified (1st page):  Tylene Fantasia NP  Time of first page:  2024  MD notified (2nd page):  Time of second page:  Responding MD:  Tylene Fantasia NP  Time MD responded:  2030

## 2014-07-06 NOTE — Progress Notes (Signed)
UR completed 

## 2014-07-06 NOTE — Progress Notes (Signed)
PHARMACIST - PHYSICIAN ORDER COMMUNICATION  CONCERNING: P&T Medication Policy on Herbal Medications  DESCRIPTION:  This patient's order for: Coenzyme Q10 has been noted.  This product(s) is classified as an "herbal" or natural product. Due to a lack of definitive safety studies or FDA approval, nonstandard manufacturing practices, plus the potential risk of unknown drug-drug interactions while on inpatient medications, the Pharmacy and Therapeutics Committee does not permit the use of "herbal" or natural products of this type within [facility].   ACTION TAKEN: The pharmacy department is unable to verify this order at this time and your patient has been informed of this safety policy. Please reevaluate patient's clinical condition at discharge and address if the herbal or natural product(s) should be resumed at that time.  Romeo Rabon, PharmD, pager [phone]. 07/06/2014,12:37 PM.

## 2014-07-06 NOTE — ED Notes (Signed)
Pt reports falling last night.  Pt c/o of pain in right hip and leg.  Pt hx of right femur fracture last September.  Pt unsure of what caused fall.  States he was walking with walker and just fell.  Denies loss of consciousness.  Pt reports possible hit to the head (no bruising or other trauma visible).  Pt states he is now unable to walk or stand.  Denies numbness in right leg.  No acute distress, respirations equal and unlabored, skin warm and dry.

## 2014-07-06 NOTE — Progress Notes (Addendum)
PT Cancellation Note  Patient Details Name: ORVILLE MENA MRN: [id number] DOB: June 25, 1928   Cancelled Treatment:    Reason Eval/Treat Not Completed: Other (comment) pt has bedrest order until tomorrow, paged hospitalist who stated its ok to see pt today, pt declined PT, stated he's too worn out right now from not sleeping last night.  Will follow.    Blondell Reveal Kistler 07/06/2014, 2:43 PM [phone]

## 2014-07-06 NOTE — Consult Note (Signed)
DRAGON THRUSH 07/06/2014 Pearson Grippe, B Requesting Physician:  Dhungel  Reason for Consult:  Hyponatremia HPI:  30M w/ LC Myeloma under remission originally presenting 1997 as [place] LC cast nephropathy with resolution of AKI and now off therapy with active surveillance.  Also with chronic sHF 2/2 ICM, CAD s/p CABG, PVD s/p femoral endarterectomy.  He has had chronic mild hypoatnremia for several years, which worsened over course of this year.    He presented to [facility] ED today with repetitive falls and imbalance. SNa was 120 and had been this low throughout the month.  He denies orthostasis or hypotension.  Not on any diuretics.  Drinks >60oz fluids/day. No N/V/diarrhea.  Only has scant end of day LEE.  Normal UOP.  No seizures or AMS. No known thyroid disease.  Not on NSAIDS.  He has been on duloxetine for past approx 29mo.   No excess thirst. No dry mouth. 2V CXR upon admission with ? Retrocardiac opacity.  Sodium (mEq/L)  Date Value  07/06/2014 120*  07/03/2014 121*  06/24/2014 126*  06/22/2014 119 Repeated and verified X2.*  06/05/2014 124*  05/08/2014 125*  04/10/2014 132*  03/26/2014 130*  03/25/2014 129*  03/25/2014 126*  03/24/2014 129*  03/23/2014 127*  03/11/2014 122*  02/04/2014 130*  12/31/2013 130*  11/26/2013 130*  10/29/2013 132*  10/01/2013 131*  09/02/2013 132*  08/22/2013 130*  08/21/2013 130*  08/20/2013 129*  08/19/2013 129*  08/18/2013 129*  08/16/2013 132*  08/13/2013 131*  07/16/2013 134*  06/17/2013 127*  05/12/2013 130*  04/23/2013 123*  03/26/2013 129*  03/05/2013 126*  02/05/2013 137   01/08/2013 135*  12/20/2012 135   12/18/2012 131*  12/13/2012 134*  12/03/2012 134*  11/06/2012 139   10/02/2012 128*  09/25/2012 130*  08/29/2012 132*  08/01/2012 139   07/04/2012 134*  06/06/2012 136   05/10/2012 133*  04/04/2012 125*  03/20/2012 132*  03/16/2012 132*  03/14/2012 133*  ] ROS NSAIDS: none Balance of 12 systems is negative w/ exceptions as above  PMH  Past Medical History   Diagnosis Date  . ASCVD (arteriosclerotic cardiovascular disease)   . Anemia     chronic mild anemia  . Gout   . Hypertension   . Lumbar disc disease     post laminectomy  . Chronic back pain   . History of echocardiogram 11/02/2010    EF range of 30 to 35% / There is hypokinsesis of the basal-mild inferolateral myocardium  . CHF (congestive heart failure)   . Heart murmur   . Ischemic cardiomyopathy   . Renal insufficiency   . Peripheral neuropathy   . Inferior myocardial infarction 1986  . Hyperlipidemia   . SOB (shortness of breath)   . Fatigue   . Peripheral neuropathy   . Coronary artery disease     CABG 2002 by Dr. Roxy Manns with LIMA to LAD, SVG to intermediate, SVG to LCX, & SVG to PL. Last nuclear in 2012 showing large inferior scar with EF of 39%.   . Blood transfusion     DEC 2012 - TWO UNITS  . Myeloma     Multiple myeloma, with recurrence of increasing problems  DR. [place] -ONCOLOGIST.  PT HAS BEEN OFF CHEMO SINCE HIS HOSPITALIZATION FEB 2013 FOR LEFT FOOT INFECTION  . Cancer   . History of shingles MARCH 2009    SHINGLE LESIONS WERE AROUND RIGHT EYE--PT HAS RESIDUAL ITCHING AROUND THE EYE.  . Osteomyelitis     s/p toe amputation  .  Chronic systolic heart failure     EF of 30% per echo 02/2012  . Protein malnutrition   . Critical lower limb ischemia    PSH  Past Surgical History  Procedure Laterality Date  . Laminectomy    . Inguinal hernia repair    . Cardiac catheterization  06/20/2000    Severe coronary disease (totally occluded right artery, 90% left circumflex, 50-60% intermediate, and 90-95% ostial left anterior descending)   . Tonsillectomy    . Coronary artery bypass graft  2002    x4 / Left internal mammary artery to the LAD.  / Saphenous vein graft to the right posterolateral.  /  Saphenous vein graft to  the ramus intermedius. / Saphenous vein graft to the circumflex marginal     . Amputation  03/06/2012    Procedure: AMPUTATION RAY;  Surgeon: Alta Corning, MD;  Location: [facility];  Service: Orthopedics;  Laterality: Left;  First Ray Amputation  . Intramedullary (im) nail intertrochanteric Right 08/17/2013    Procedure: INTRAMEDULLARY (IM) NAIL INTERTROCHANTRIC;  Surgeon: Marybelle Killings, MD;  Location: [facility];  Service: Orthopedics;  Laterality: Right;  . Endarterectomy femoral Left 03/24/2014    Procedure: Left Common Femoral  Artery Endarterectomy with Vein Patch Angioplasty,  Ultrasound ;  Surgeon: Angelia Mould, MD;  Location: [facility];  Service: Vascular;  Laterality: Left;  . Toe amputation     FH History reviewed. No pertinent family history. SH  reports that he quit smoking about 29 years ago. His smoking use included Cigarettes. He has a 20 pack-year smoking history. He has never used smokeless tobacco. He reports that he does not drink alcohol or use illicit drugs. Allergies  Allergies  Allergen Reactions  . Morphine And Related Other (See Comments)    Agitated with morphine drip   Home medications Prior to Admission medications   Medication Sig Start Date End Date Taking? Authorizing Provider  allopurinol (ZYLOPRIM) 300 MG tablet Take 0.5 tablets (150 mg total) by mouth daily. 06/22/14  Yes Burtis Junes, NP  aspirin EC 81 MG tablet Take 81 mg by mouth 3 (three) times a week. Monday, Wednesday, Friday   Yes Historical Provider, MD  atorvastatin (LIPITOR) 10 MG tablet Take 1 tablet (10 mg total) by mouth every evening. 06/22/14  Yes Burtis Junes, NP  B Complex-Biotin-FA (B COMPLETE PO) Take 1 tablet by mouth daily.    Yes Historical Provider, MD  carvedilol (COREG) 6.25 MG tablet Take 1 tablet (6.25 mg total) by mouth 2 (two) times daily with a meal. 06/22/14  Yes Burtis Junes, NP  Coenzyme Q10 (CO Q-10 PO) Take 1 tablet by mouth every morning.    Yes Historical Provider, MD  DULoxetine (CYMBALTA) 60 MG capsule Take 60 mg by mouth every evening. 02/16/14  Yes [name], MD  HYDROmorphone (DILAUDID) 4 MG tablet Take 8  mg by mouth as needed for severe pain.  05/27/14  Yes [name], MD  lisinopril (PRINIVIL,ZESTRIL) 20 MG tablet Take 20 mg by mouth daily.   Yes Historical Provider, MD  Multiple Vitamin (MULTIVITAMIN) capsule Take 1 capsule by mouth every morning.    Yes Historical Provider, MD  nitroGLYCERIN (NITROSTAT) 0.4 MG SL tablet Place 0.4 mg under the tongue every 5 (five) minutes as needed for chest pain.   Yes Historical Provider, MD  polyethylene glycol (MIRALAX / GLYCOLAX) packet Take 17 g by mouth daily as needed. 09/05/13  Yes Bary Leriche,  PA-C  tamsulosin (FLOMAX) 0.4 MG CAPS capsule Take 0.4 mg by mouth at bedtime.   Yes Historical Provider, MD  tobramycin (TOBREX) 0.3 % ophthalmic solution Place 1 drop into both eyes daily as needed (for Bacterial eye infection).  01/25/14  Yes Historical Provider, MD  vitamin C (ASCORBIC ACID) 500 MG tablet Take 500 mg by mouth every morning.   Yes Historical Provider, MD    Current Medications Current Facility-Administered Medications  Medication Dose Route Frequency Provider Last Rate Last Dose  . 0.9 %  sodium chloride infusion   Intravenous STAT Nelia Shi, MD 75 mL/hr at 07/06/14 1042    . acetaminophen (TYLENOL) tablet 650 mg  650 mg Oral Q6H PRN Nishant Dhungel, MD       Or  . acetaminophen (TYLENOL) suppository 650 mg  650 mg Rectal Q6H PRN Nishant Dhungel, MD      . Melene Muller ON 07/07/2014] allopurinol (ZYLOPRIM) tablet 150 mg  150 mg Oral Daily Nishant Dhungel, MD      . aspirin EC tablet 81 mg  81 mg Oral Once per day on Mon Wed Fri Nishant Dhungel, MD      . atorvastatin (LIPITOR) tablet 10 mg  10 mg Oral QPM Nishant Dhungel, MD      . Melene Muller ON 07/07/2014] B-complex with vitamin C tablet 1 tablet  1 tablet Oral Daily Nishant Dhungel, MD      . carvedilol (COREG) tablet 6.25 mg  6.25 mg Oral BID WC Nishant Dhungel, MD      . DULoxetine (CYMBALTA) DR capsule 60 mg  60 mg Oral QPM Nishant Dhungel, MD      . enoxaparin (LOVENOX) injection 40  mg  40 mg Subcutaneous Q24H Nishant Dhungel, MD      . HYDROmorphone (DILAUDID) injection 2 mg  2 mg Intravenous Q3H PRN Nishant Dhungel, MD      . lisinopril (PRINIVIL,ZESTRIL) tablet 20 mg  20 mg Oral Daily Nishant Dhungel, MD      . Melene Muller ON 07/07/2014] multivitamin with minerals tablet 1 tablet  1 tablet Oral Daily Nishant Dhungel, MD      . nitroGLYCERIN (NITROSTAT) SL tablet 0.4 mg  0.4 mg Sublingual Q5 min PRN Nishant Dhungel, MD      . ondansetron (ZOFRAN) tablet 4 mg  4 mg Oral Q6H PRN Nishant Dhungel, MD       Or  . ondansetron (ZOFRAN) injection 4 mg  4 mg Intravenous Q6H PRN Nishant Dhungel, MD      . polyethylene glycol (MIRALAX / GLYCOLAX) packet 17 g  17 g Oral Daily PRN Nishant Dhungel, MD      . sodium chloride 0.9 % injection 3 mL  3 mL Intravenous Q12H Nishant Dhungel, MD      . tamsulosin (FLOMAX) capsule 0.4 mg  0.4 mg Oral QHS Nishant Dhungel, MD      . tobramycin (TOBREX) 0.3 % ophthalmic solution 1 drop  1 drop Both Eyes Daily PRN Nishant Dhungel, MD      . Melene Muller ON 07/07/2014] vitamin C (ASCORBIC ACID) tablet 500 mg  500 mg Oral q morning - 10a Nishant Dhungel, MD        CBC  Recent Labs Lab 07/03/14 1052 07/06/14 0908  WBC 6.1 8.2  NEUTROABS 3.5 5.6  HGB 10.1* 10.2*  HCT 30.4* 29.5*  MCV 85.4 82.6  PLT 198 174   Basic Metabolic Panel  Recent Labs Lab 07/03/14 1053 07/06/14 0908  NA 121* 120*  K 4.7  4.8  CL  --  85*  CO2 24 23  GLUCOSE 103 127*  BUN 14.9 15  CREATININE 1.1 0.92  CALCIUM 9.2 9.2    Physical Exam  Blood pressure 132/74, pulse 65, temperature 97.9 F (36.6 C), temperature source Oral, resp. rate 18, height $RemoveBe'5\' 10"'[id number]$  (1.778 m), weight 81.6 kg (179 lb 14.3 oz), SpO2 94.00%. GEN: NAD, appears well ENT: NCAT, MMM EYES: EOMI, anicteric CV: RRR PULM: CTAB, some basialr crackles that resolve with coughing ABD: obese, s/nt/nd SKIN: no rashes/lesions EXT:No LEE  Assessment 73M with chronic hyponatremia with acute worsening; presenting  with falls, imbalance.  He likely has chronic SIADH, ? From duloxetine.  Also has possible pulm nodule with CT chest ordered.   1. Chronic euvolemic presumed hypotonic hyponatremia: suspect SIADH, mild.  Not grossly symptomatic (No N/V, AMS, seizures) 2. Falls, loss of balance; very  Likely related to #1 3. Neuropathic pain on duloxetine 4. ? L lung nodule 5. Chronic sHF and ICM: appears euvolemic on ACEi and BB 6. Myeloma in active surveillance 7. Anemia on ESA, stable  PLAN 1. Agree with fluid restriction at 1.2L / day 2. Encourage more liberal protein and Na intake 3. Stop duloxetine 4. No indication for vaptan or HT saline at this point 5. UOsm, UNa, SOSm, Uric Acid 6. BID SNa levels 7. Agree with CT Chest 8. Check TSH and AM cortisol    Pearson Grippe MD 07/06/2014, 1:14 PM

## 2014-07-06 NOTE — H&P (Addendum)
[facility] History and Physical  Timothy [name] UTM:[id number] DOB: 1928/10/22 DOA: 07/06/2014  Referring physician: Dr Audie Pinto PCP: [name], MD   Chief Complaint:  Fall at home   HPI:  80 year old male with history of multiple myeloma since 2007 currently underwent no therapy and follow up with Dr. [name] , ischemic cardiomyopathy with EF of 35-40%, CAD status post CABG, history of gout, hypertension, peripheral neuropathy, history of peripheral vascular disease with a nonhealing left foot ulcer status post left femoral endarterectomy with angioplasty in May 2015 presented to the ED with a fall this morning. Patient that he is been using a walker for past several months but for the past 3 months he has been unsteady during ambulation and has had about the falls at home during this time. He reports that while using his walker today he had a fall at home and land on his right hip. He had difficulty getting up and walking after that. He denies any loss of consciousness or feeling dizzy or having a syncopal event. She denies any claudication, tingling or numbness to the extremity. He sustained right hip fracture requiring IM nailing in October 2014 following which she was able to ambulate with the help of a walker. Patient has also been found to be hyponatremic , worsened for past 3 months . He was advised to be on fluid restriction by his cardiologist however was in a dilemma as he was supposed to hydrate himself to perfuse his kidneys well due to his multiple myeloma. During his recent visit with the cardiologist he had a head CT done for the hyponatremia which was unremarkable and an outpatient visit with nephrologist [name] was scheduled for 8/21. Patient denies headache, dizziness, fever, chills, nausea , vomiting, chest pain, palpitations, SOB, abdominal pain, bowel or urinary symptoms. Denies change in weight or appetite.  Course in the ED Patient's vitals were stable.  Blood work done showed normal WBC, hemoglobin of 10.2, platelets of 174. Sodium of 120, potassium 4.8, chloride of 85, CO2 of 33, BUN 15 and creatinine 0.92. The glucose was 127. An x-ray of the right hip was negative for acute fracture or dislocation with moderate symmetric narrowing of both hip joints. A chest x-ray done showed 1.5 X1.5 cm nodule over the left lung which is new. Patient's PCP Dr. [name]  was consulted by ED physician and hospitalist admission requested.   Review of Systems:  Constitutional: Denies fever, chills, diaphoresis, appetite change . reports fatigue.  HEENT: Denies photophobia, eye pain,  hearing loss, ear pain, congestion,  trouble swallowing, neck pain, Respiratory: Denies SOB, DOE, cough, chest tightness,  and wheezing.   Cardiovascular: Denies chest pain, palpitations and leg swelling.  Gastrointestinal: Denies nausea, vomiting, abdominal pain, diarrhea, constipation, blood in stool and abdominal distention.  Genitourinary: Denies dysuria, urgency, frequency, hematuria, flank pain and difficulty urinating.  Endocrine: Denies hot or cold intolerance Musculoskeletal: Chronic back pain, unsteady gait, Denies myalgias,  joint swelling or joint pain,  Skin: Denies pallor, rash and wound.  Neurological: Generalized Weakness, Denies dizziness, seizures, syncope,  light-headedness, numbness and headaches.  Psychiatric/Behavioral: Denies confusion sleep disturbance    Past Medical History  Diagnosis Date  . ASCVD (arteriosclerotic cardiovascular disease)   . Anemia     chronic mild anemia  . Gout   . Hypertension   . Lumbar disc disease     post laminectomy  . Chronic back pain   . History of echocardiogram 11/02/2010    EF range of  30 to 35% / There is hypokinsesis of the basal-mild inferolateral myocardium  . CHF (congestive heart failure)   . Heart murmur   . Ischemic cardiomyopathy   . Renal insufficiency   . Peripheral neuropathy   . Inferior  myocardial infarction 1986  . Hyperlipidemia   . SOB (shortness of breath)   . Fatigue   . Peripheral neuropathy   . Coronary artery disease     CABG 2002 by Dr. Roxy Manns with LIMA to LAD, SVG to intermediate, SVG to LCX, & SVG to PL. Last nuclear in 2012 showing large inferior scar with EF of 39%.   . Blood transfusion     DEC 2012 - TWO UNITS  . Myeloma     Multiple myeloma, with recurrence of increasing problems  DR. Stanislaus -ONCOLOGIST.  PT HAS BEEN OFF CHEMO SINCE HIS HOSPITALIZATION FEB 2013 FOR LEFT FOOT INFECTION  . Cancer   . History of shingles MARCH 2009    SHINGLE LESIONS WERE AROUND RIGHT EYE--PT HAS RESIDUAL ITCHING AROUND THE EYE.  . Osteomyelitis     s/p toe amputation  . Chronic systolic heart failure     EF of 30% per echo 02/2012  . Protein malnutrition   . Critical lower limb ischemia    Past Surgical History  Procedure Laterality Date  . Laminectomy    . Inguinal hernia repair    . Cardiac catheterization  06/20/2000    Severe coronary disease (totally occluded right artery, 90% left circumflex, 50-60% intermediate, and 90-95% ostial left anterior descending)   . Tonsillectomy    . Coronary artery bypass graft  2002    x4 / Left internal mammary artery to the LAD.  / Saphenous vein graft to the right posterolateral.  /  Saphenous vein graft to  the ramus intermedius. / Saphenous vein graft to the circumflex marginal     . Amputation  03/06/2012    Procedure: AMPUTATION RAY;  Surgeon: Alta Corning, MD;  Location: [facility];  Service: Orthopedics;  Laterality: Left;  First Ray Amputation  . Intramedullary (im) nail intertrochanteric Right 08/17/2013    Procedure: INTRAMEDULLARY (IM) NAIL INTERTROCHANTRIC;  Surgeon: Marybelle Killings, MD;  Location: [facility];  Service: Orthopedics;  Laterality: Right;  . Endarterectomy femoral Left 03/24/2014    Procedure: Left Common Femoral  Artery Endarterectomy with Vein Patch Angioplasty,  Ultrasound ;  Surgeon: Angelia Mould, MD;   Location: [place];  Service: Vascular;  Laterality: Left;  . Toe amputation     Social History:  reports that he quit smoking about 29 years ago. His smoking use included Cigarettes. He has a 20 pack-year smoking history. He has never used smokeless tobacco. He reports that he does not drink alcohol or use illicit drugs.  Allergies  Allergen Reactions  . Morphine And Related Other (See Comments)    Agitated with morphine drip    History reviewed. No pertinent family history.  Prior to Admission medications   Medication Sig Start Date End Date Taking? Authorizing Provider  allopurinol (ZYLOPRIM) 300 MG tablet Take 0.5 tablets (150 mg total) by mouth daily. 06/22/14  Yes Burtis Junes, NP  aspirin EC 81 MG tablet Take 81 mg by mouth 3 (three) times a week. Monday, Wednesday, Friday   Yes Historical Provider, MD  atorvastatin (LIPITOR) 10 MG tablet Take 1 tablet (10 mg total) by mouth every evening. 06/22/14  Yes Burtis Junes, NP  B Complex-Biotin-FA (B COMPLETE PO) Take 1 tablet by  mouth daily.    Yes Historical Provider, MD  carvedilol (COREG) 6.25 MG tablet Take 1 tablet (6.25 mg total) by mouth 2 (two) times daily with a meal. 06/22/14  Yes Burtis Junes, NP  Coenzyme Q10 (CO Q-10 PO) Take 1 tablet by mouth every morning.    Yes Historical Provider, MD  DULoxetine (CYMBALTA) 60 MG capsule Take 60 mg by mouth every evening. 02/16/14  Yes [name], MD  HYDROmorphone (DILAUDID) 4 MG tablet Take 8 mg by mouth as needed for severe pain.  05/27/14  Yes [name], MD  lisinopril (PRINIVIL,ZESTRIL) 20 MG tablet Take 20 mg by mouth daily.   Yes Historical Provider, MD  Multiple Vitamin (MULTIVITAMIN) capsule Take 1 capsule by mouth every morning.    Yes Historical Provider, MD  nitroGLYCERIN (NITROSTAT) 0.4 MG SL tablet Place 0.4 mg under the tongue every 5 (five) minutes as needed for chest pain.   Yes Historical Provider, MD  polyethylene glycol (MIRALAX / GLYCOLAX) packet Take 17 g  by mouth daily as needed. 09/05/13  Yes Ivan Anchors Love, PA-C  tamsulosin (FLOMAX) 0.4 MG CAPS capsule Take 0.4 mg by mouth at bedtime.   Yes Historical Provider, MD  tobramycin (TOBREX) 0.3 % ophthalmic solution Place 1 drop into both eyes daily as needed (for Bacterial eye infection).  01/25/14  Yes Historical Provider, MD  vitamin C (ASCORBIC ACID) 500 MG tablet Take 500 mg by mouth every morning.   Yes Historical Provider, MD     Physical Exam:  Filed Vitals:   07/06/14 0855 07/06/14 1000 07/06/14 1030  BP: 164/72 150/72 137/71  Pulse: 73 72 61  Temp: 97.9 F (36.6 C)    TempSrc: Oral    Resp: 20    Height: _0  (1.778 m)    Weight: 81.647 kg (180 lb)    SpO2: 96% 98% 93%    Constitutional: Vital signs reviewed. Elderly male  in no acute distress. HEENT: no pallor, no icterus, moist oral mucosa, no cervical lymphadenopathy Cardiovascular: RRR, S1 normal, S2 normal, no MRG Chest: CTAB, no wheezes, rales, or rhonchi Abdominal: Soft. Non-tender, non-distended, bowel sounds are normal,  Ext: warm, limited ROM of rt hip due to pain, distal pulses palpable, noedema Neurological: A&O x3, non focal  Labs on Admission:  Basic Metabolic Panel:  Recent Labs Lab 07/03/14 1053 07/06/14 0908  NA 121* 120*  K 4.7 4.8  CL  --  85*  CO2 24 23  GLUCOSE 103 127*  BUN 14.9 15  CREATININE 1.1 0.92  CALCIUM 9.2 9.2   Liver Function Tests:  Recent Labs Lab 07/03/14 1053  AST 22  ALT 12  ALKPHOS 56  BILITOT 0.53  PROT 7.0  ALBUMIN 3.6   No results found for this basename: LIPASE, AMYLASE,  in the last 168 hours No results found for this basename: AMMONIA,  in the last 168 hours CBC:  Recent Labs Lab 07/03/14 1052 07/06/14 0908  WBC 6.1 8.2  NEUTROABS 3.5 5.6  HGB 10.1* 10.2*  HCT 30.4* 29.5*  MCV 85.4 82.6  PLT 198 174   Cardiac Enzymes: No results found for this basename: CKTOTAL, CKMB, CKMBINDEX, TROPONINI,  in the last 168 hours BNP: No components found with  this basename: POCBNP,  CBG: No results found for this basename: GLUCAP,  in the last 168 hours  Radiological Exams on Admission: Dg Chest 2 View  07/06/2014   CLINICAL DATA:  Chest pain; recent trauma ; history of multiple  myeloma  EXAM: CHEST  2 VIEW  COMPARISON:  February 04, 2014  FINDINGS: There is a nondisplaced fracture of the anterolateral right seventh rib. No pneumothorax or effusion.  There is a questionable nodular opacity in the left lower lobe measuring 1.5 x 1.5 cm. There is mild scarring in the left base. Elsewhere lungs are clear.  Heart is mildly enlarged with pulmonary vascularity within normal limits. Patient is status post coronary artery bypass grafting. No adenopathy.  IMPRESSION: 1.5 x 1.5 cm questionable nodular opacity left lower lobe seen only on frontal view. Advise noncontrast enhanced chest CT to further assess.  Nondisplaced fracture anterolateral right seventh rib. No pneumothorax.  No edema or consolidation.  Mild scarring left base.   Electronically Signed   By: Lowella Grip M.D.   On: 07/06/2014 09:45   Dg Hip Complete Right  07/06/2014   CLINICAL DATA:  Pain post trauma ; history of multiple myeloma  EXAM: RIGHT HIP - COMPLETE 2+ VIEW  COMPARISON:  August 17, 2013  FINDINGS: Frontal pelvis as well as frontal and lateral right hip images were obtained. There is postoperative change in the proximal right femur. No acute fracture or dislocation is appreciable. There is moderate symmetric narrowing of both hip joints. There is no apparent blastic or lytic bone lesion. There is myositis ossificans in the area of previous trauma on the right. There is lower lumbar levoscoliosis. There are multiple foci of arterial vascular calcification.  IMPRESSION: Postoperative change proximal right femur. There is myositis ossificans in this area consistent with old trauma. No acute fracture or dislocation. Moderate symmetric narrowing of both hip joints.   Electronically Signed   By:  Lowella Grip M.D.   On: 07/06/2014 09:41    EKG:   Assessment/Plan Principal Problem:   Hyponatremia  On reviewing his labs pt did have midd hyponatremia for a year but worsening for past 3 months.  differential includes prerenal with dehydration vs cardiorenal from CHF vs SIADH. Check UA, FeNa, urine and serum osmolality, TSH and uric acid. Will restrict fluid intake to 1200 cc/ day.  No neurological deficit except for generalized weakness. -CT chest  given new left lung nodule.  -pt has appt with Dr Lorrene Reid later this week as outpt. Renal consulted for inpt evaluation.   Active Problems:   Acute right hip pain Secondary to fall. No fracture seen on xray. Will monitor with pain control on prn IV dilaudid and PT. add when necessary Robaxin for muscle spasm. Has limited ROM of rt hip with pain . If symptoms persists despite adequate pain control will obtain ct of the hip to r/o small fracture that could be missed out on xray Dr Lorin Mercy is his orthopedic surgeon who will be consulted if needed.  Frequent falls Has been going on for past few months. Recent head CT negative. patient reports issues with his balance with unsteadiness even with his walker and has had 2-3 falls at home.Marland Kitchen He has had stent paced in LLE for critical ischemia and seems to be perfusing well. Will obtain MRI brain to r/o any cerebellar lesion.  -Check orthostatic vitals. PT eval.  Left lung nodule Incidental finding on chest x-ray. History of remote smoking. Denies any weight loss symptoms. Spoke with radiology( Dr Vesta Mixer) recommends obtaining CT scan of the chest without contrast. Will order.    Ischemic cardiomyopathy Follows with Dr [place]. Currently euvolemic. Has EF of 35-40% as per echo in 06/2013. continue ASA , lisinopril and coreg.  Multiple myeloma Follows with Dr [name] and stable for now.   Chronic low back pain Continue when necessary Dilaudid    Hypertension Resume home meds    gout Asymptomatic. Continue allopurinol    Anemia Stale at baseline      CAD (coronary artery disease) S/p CABG. Continue ASA and coreg. also has hx of PVD with critical limb ischemia recurring left leg stent in 03/2014.   BPH Continue Flomax       Diet:cardiac with fluid restriction to 1200 cc/ day  DVT prophylaxis: sq lovenox   Code Status: full code Family Communication: Wife at bedside Disposition Plan: admit to inpatient  Louellen Molder [facility] Pager [phone]  Total time spent on admission :70 minutes  If 7PM-7AM, please contact night-coverage [email or web address] Password [email or web address] 07/06/2014, 11:59 AM

## 2014-07-07 DIAGNOSIS — I5022 Chronic systolic (congestive) heart failure: Secondary | ICD-10-CM

## 2014-07-07 DIAGNOSIS — D638 Anemia in other chronic diseases classified elsewhere: Secondary | ICD-10-CM

## 2014-07-07 DIAGNOSIS — C9000 Multiple myeloma not having achieved remission: Secondary | ICD-10-CM

## 2014-07-07 DIAGNOSIS — I10 Essential (primary) hypertension: Secondary | ICD-10-CM

## 2014-07-07 DIAGNOSIS — I509 Heart failure, unspecified: Secondary | ICD-10-CM

## 2014-07-07 LAB — BASIC METABOLIC PANEL
Anion gap: 12 (ref 5–15)
BUN: 18 mg/dL (ref 6–23)
CHLORIDE: 87 meq/L — AB (ref 96–112)
CO2: 23 meq/L (ref 19–32)
CREATININE: 1.17 mg/dL (ref 0.50–1.35)
Calcium: 9.2 mg/dL (ref 8.4–10.5)
GFR calc Af Amer: 65 mL/min — ABNORMAL LOW (ref >90)
GFR calc non Af Amer: 56 mL/min — ABNORMAL LOW (ref >90)
GLUCOSE: 117 mg/dL — AB (ref 70–99)
POTASSIUM: 4.6 meq/L (ref 3.7–5.3)
Sodium: 122 mEq/L — ABNORMAL LOW (ref 137–147)

## 2014-07-07 LAB — OSMOLALITY, URINE: Osmolality, Ur: 428 mOsm/kg (ref 390–1090)

## 2014-07-07 LAB — T4, FREE: Free T4: 1.19 ng/dL (ref 0.80–1.80)

## 2014-07-07 LAB — TSH: TSH: 7.3 u[IU]/mL — AB (ref 0.350–4.500)

## 2014-07-07 LAB — SODIUM: Sodium: 120 mEq/L — CL (ref 137–147)

## 2014-07-07 MED ORDER — LEVOTHYROXINE SODIUM 50 MCG PO TABS
50.0000 ug | ORAL_TABLET | Freq: Every day | ORAL | Status: DC
  Administered 2014-07-07 – 2014-07-10 (×4): 50 ug via ORAL
  Filled 2014-07-07 (×5): qty 1

## 2014-07-07 MED ORDER — HYDROMORPHONE HCL PF 2 MG/ML IJ SOLN
2.0000 mg | INTRAMUSCULAR | Status: DC | PRN
  Administered 2014-07-07 – 2014-07-10 (×5): 2 mg via INTRAVENOUS
  Filled 2014-07-07 (×6): qty 1

## 2014-07-07 MED ORDER — ROPINIROLE HCL 1 MG PO TABS
2.2500 mg | ORAL_TABLET | Freq: Every day | ORAL | Status: DC
  Administered 2014-07-07 – 2014-07-09 (×3): 2.25 mg via ORAL
  Filled 2014-07-07 (×4): qty 1

## 2014-07-07 NOTE — Evaluation (Signed)
Physical Therapy Evaluation Patient Details Name: Timothy [name] MRN: [id number] DOB: 04/24/1928 Today's Date: 07/07/2014   History of Present Illness  80 yo male admitted 07/06/14 after he had a fall at home and land on his right hip. He had difficulty getting up and walking after that. Pt had a R  hip ORIF in 10/14.  Xray negative for fracture of R hip  Clinical Impression  Pt has significant gait dysfunction, Ataxic-like  and  Poor coordination. Pt also has a R hip "pocket" of swelling . RN Aware. Pt will benefit from PT to address problems listed in note below. Pt may require SNF rehab but does not desire it.   Follow Up Recommendations SNF (pt states that he DOES NOT want SNF rehab.)    Equipment Recommendations  None recommended by PT    Recommendations for Other Services OT consult     Precautions / Restrictions Precautions Precautions: Fall Precaution Comments: R hip is very painful      Mobility  Bed Mobility Overal bed mobility: Needs Assistance;+2 for physical assistance;+ 2 for safety/equipment Bed Mobility: Supine to Sit     Supine to sit: HOB elevated;+2 for physical assistance;+2 for safety/equipment;Max assist     General bed mobility comments: pt has difficulty moving legs to edge, keeps legs rigid, moved legs together, support at trunk to sit upright. tends to be retropulsive.  Transfers Overall transfer level: Needs assistance Equipment used: Rolling walker (2 wheeled) Transfers: Sit to/from Stand Sit to Stand: Total assist;+2 safety/equipment;+2 physical assistance;From elevated surface         General transfer comment: lifting assist to stand at RW, retropulsive, feet have to be supported in  front to prevent sliding.  Ambulation/Gait Ambulation/Gait assistance: Total assist;+2 physical assistance;+2 safety/equipment Ambulation Distance (Feet): 5 Feet Assistive device: Rolling walker (2 wheeled) Gait Pattern/deviations: Step-to  pattern;Shuffle;Staggering left;Staggering right;Leaning posteriorly;Ataxic;Decreased weight shift to right;Decreased dorsiflexion - left     General Gait Details: significant dysfunction of LE control with ambulation, poor control of trunk in upright.  Stairs            Wheelchair Mobility    Modified Rankin (Stroke Patients Only)       Balance Overall balance assessment: Needs assistance;History of Falls Sitting-balance support: Bilateral upper extremity supported;Feet supported Sitting balance-Leahy Scale: Fair     Standing balance support: Bilateral upper extremity supported;During functional activity Standing balance-Leahy Scale: Zero                               Pertinent Vitals/Pain Pain Assessment: 0-10 Pain Score: 5  Pain Location: R hip and thiogh Pain Descriptors / Indicators: Aching;Cramping Pain Intervention(s): Premedicated before session;Monitored during session;Limited activity within patient's tolerance;Repositioned    Home Living Family/patient expects to be discharged to:: Private residence Living Arrangements: Spouse/significant other Available Help at Discharge: Family;Available 24 hours/day Type of Home: House Home Access: Stairs to enter   [employer] of Steps: 1 threshold Home Layout: Two level;Able to live on main level with bedroom/bathroom Home Equipment: Gilford Rile - 2 wheels;Bedside commode;Shower seat;Shower seat - built in;Grab bars - tub/shower Additional Comments: pt reports he has had several falls, has been working on walking without AD and has fallen    Prior Function Level of Independence: Independent with assistive device(s)         Comments: uses RW; 3 falls in past few months (2 when not using RW)     Hand  Dominance        Extremity/Trunk Assessment   Upper Extremity Assessment: Overall WFL for tasks assessed           Lower Extremity Assessment: RLE deficits/detail;LLE  deficits/detail RLE Deficits / Details: pt has decreased ability to flex hip/knees prior to standing, ataxic movements, tremors LLE Deficits / Details: similar to R  Cervical / Trunk Assessment: Normal  Communication   Communication: No difficulties  Cognition Arousal/Alertness: Awake/alert Behavior During Therapy: WFL for tasks assessed/performed Overall Cognitive Status: Within Functional Limits for tasks assessed                      General Comments      Exercises General Exercises - Lower Extremity Long Arc Quad: AROM;Both;Seated Hip Flexion/Marching: AAROM;Right;10 reps;Seated      Assessment/Plan    PT Assessment Patient needs continued PT services  PT Diagnosis Difficulty walking;Abnormality of gait;Generalized weakness;Acute pain   PT Problem List Decreased strength;Decreased range of motion;Decreased activity tolerance;Decreased balance;Decreased mobility;Decreased knowledge of use of DME;Decreased safety awareness;Decreased knowledge of precautions;Pain  PT Treatment Interventions DME instruction;Gait training;Functional mobility training;Therapeutic activities;Therapeutic exercise;Balance training;Patient/family education   PT Goals (Current goals can be found in the Care Plan section) Acute Rehab PT Goals Patient Stated Goal: I want to get my balance back PT Goal Formulation: With patient Time For Goal Achievement: 07/21/14 Potential to Achieve Goals: Fair    Frequency Min 3X/week   Barriers to discharge Decreased caregiver support      Co-evaluation               End of Session Equipment Utilized During Treatment: Gait belt Activity Tolerance: Patient limited by fatigue;Patient limited by pain Patient left: in chair;with call bell/phone within reach;with chair alarm set Nurse Communication: Mobility status         Time: 9622-2979 PT Time Calculation (min): 31 min   Charges:   PT Evaluation $Initial PT Evaluation Tier I: 1  Procedure PT Treatments $Therapeutic Activity: 23-37 mins   PT G Codes:          Claretha Cooper 07/07/2014, 5:53 PM Tresa Endo PT [phone]

## 2014-07-07 NOTE — Progress Notes (Signed)
[facility] PROGRESS NOTE  Timothy [name] SJG:[id number] DOB: 10/28/1928 DOA: 07/06/2014 PCP: [name], MD  Assessment/Plan: 1-hyponatremia: appears to be multifactorial; potential mild SIADH vs to much water intake vs effect from medications (cymbalta) vs hypothyroidism. -renal on board and will follow rec's -for now continue fluid restriction -cymbalta has been discontinued -TSH elevated; will start synthroid and check free T4 -strict I's and O's  2-frequent falls, weakness and deconditioning: will ask Pt to evaluate  3-?? Lung nodule on CXR: r/o with CT scan of the chest.  4-ischemic cardiomyopathy: euvolemic. No CP or SOB. -continue current medication regimen (ASA, coreg and lisinopril) -continue follow up with cardiology  5-MM: continue follow up with Dr. [name]  6-chronic low back pain and body aches from falling episode: continue PRN analgesics -PT has been asked to evaluate and provide rec's  7-HTN: fair well controlled. Continue current meds  8-hypothyroidism: elevated TSH. -will start synthroid at 50 mcg daily -check Free T4  9-BPH and acute urinary retention: will continue flomax and follow closely bladder scan. -had 1 episode requiring in and out cath (bladder scan at that moment with almost 400 cc) -if further retention present will place foley and will need urology inputs.  10-anemia of chronic disease: stable and at baseline. -monitor Hgb trend -no overt bleeding appreciated   Code Status: Full Family Communication: wife at bedside Disposition Plan: to be determined    Consultants:  Nephrology  Oncology informed of admission  Procedures:  See below for x-ray reports    Antibiotics:  None   HPI/Subjective: Feeling ok overall. No nausea, vomiting or abd pain. Patient with urinary retention overnight requiring In and out cath. Weak and tired. Sodium slowly improving.  Objective: Filed Vitals:   07/07/14 0435  BP: 153/59   Pulse: 80  Temp: 98.2 F (36.8 C)  Resp: 20    Intake/Output Summary (Last 24 hours) at 07/07/14 1045 Last data filed at 07/07/14 0611  Gross per 24 hour  Intake      0 ml  Output   1350 ml  Net  -1350 ml   Filed Weights   07/06/14 0855 07/06/14 1203 07/07/14 0537  Weight: 81.647 kg (180 lb) 81.6 kg (179 lb 14.3 oz) 82.872 kg (182 lb 11.2 oz)    Exam:   General:  Feeling weak and tired; complaining of body aches. No fever, no CP or SOB  Cardiovascular: S1 and S2, no rubs or gallops  Respiratory: CTA bilaterally  Abdomen: soft, NT, ND, positive BS  Musculoskeletal: no edema or cyanosis  Data Reviewed: Basic Metabolic Panel:  Recent Labs Lab 07/03/14 1053 07/06/14 0908 07/06/14 1845 07/07/14 0455  NA 121* 120* 120* 122*  K 4.7 4.8  --  4.6  CL  --  85*  --  87*  CO2 24 23  --  23  GLUCOSE 103 127*  --  117*  BUN 14.9 15  --  18  CREATININE 1.1 0.92  --  1.17  CALCIUM 9.2 9.2  --  9.2   Liver Function Tests:  Recent Labs Lab 07/03/14 1053  AST 22  ALT 12  ALKPHOS 56  BILITOT 0.53  PROT 7.0  ALBUMIN 3.6   CBC:  Recent Labs Lab 07/03/14 1052 07/06/14 0908  WBC 6.1 8.2  NEUTROABS 3.5 5.6  HGB 10.1* 10.2*  HCT 30.4* 29.5*  MCV 85.4 82.6  PLT 198 174   BNP (last 3 results)  Recent Labs  06/22/14 1146  PROBNP 88.0  Studies: Dg Chest 2 View  07/06/2014   CLINICAL DATA:  Chest pain; recent trauma ; history of multiple myeloma  EXAM: CHEST  2 VIEW  COMPARISON:  February 04, 2014  FINDINGS: There is a nondisplaced fracture of the anterolateral right seventh rib. No pneumothorax or effusion.  There is a questionable nodular opacity in the left lower lobe measuring 1.5 x 1.5 cm. There is mild scarring in the left base. Elsewhere lungs are clear.  Heart is mildly enlarged with pulmonary vascularity within normal limits. Patient is status post coronary artery bypass grafting. No adenopathy.  IMPRESSION: 1.5 x 1.5 cm questionable nodular opacity  left lower lobe seen only on frontal view. Advise noncontrast enhanced chest CT to further assess.  Nondisplaced fracture anterolateral right seventh rib. No pneumothorax.  No edema or consolidation.  Mild scarring left base.   Electronically Signed   By: Lowella Grip M.D.   On: 07/06/2014 09:45   Dg Hip Complete Right  07/06/2014   CLINICAL DATA:  Pain post trauma ; history of multiple myeloma  EXAM: RIGHT HIP - COMPLETE 2+ VIEW  COMPARISON:  August 17, 2013  FINDINGS: Frontal pelvis as well as frontal and lateral right hip images were obtained. There is postoperative change in the proximal right femur. No acute fracture or dislocation is appreciable. There is moderate symmetric narrowing of both hip joints. There is no apparent blastic or lytic bone lesion. There is myositis ossificans in the area of previous trauma on the right. There is lower lumbar levoscoliosis. There are multiple foci of arterial vascular calcification.  IMPRESSION: Postoperative change proximal right femur. There is myositis ossificans in this area consistent with old trauma. No acute fracture or dislocation. Moderate symmetric narrowing of both hip joints.   Electronically Signed   By: Lowella Grip M.D.   On: 07/06/2014 09:41   Ct Chest Wo Contrast  07/06/2014   CLINICAL DATA:  Possible lung nodule.  EXAM: CT CHEST WITHOUT CONTRAST  TECHNIQUE: Multidetector CT imaging of the chest was performed following the standard protocol without IV contrast.  COMPARISON:  Chest x-ray 07/06/2014.  Remote chest CT from 2008.  FINDINGS: Chest wall:  No chest wall mass, supraclavicular or axillary lymphadenopathy. The thyroid gland appears normal. The bony thorax is intact. No destructive bone lesions or spinal canal compromise. There are remote healed rib fractures bilaterally. Surgical changes from bypass surgery are noted.  Mediastinum:  The heart is mildly enlarged. No pericardial effusion. No mediastinal or hilar mass or adenopathy.  Small scattered lymph nodes are noted. There is moderate atherosclerotic change involving the aorta and branch vessels including the coronary arteries. The esophagus is grossly normal.  Lungs:  Emphysematous changes are noted and slightly progressive when compared to prior CT scan. There is lower lobe predominant peribronchial thickening and increased interstitial markings. No focal airspace consolidation to suggest pneumonia. No worrisome pulmonary lesions. No pleural effusion.  Upper abdomen:  No significant findings.  IMPRESSION: 1. Emphysematous changes and bronchitic changes but no focal infiltrate or worrisome pulmonary lesion. 2. Moderate atherosclerotic calcifications involving the aorta and branch vessels. 3. Remote rib fractures bilaterally   Electronically Signed   By: Kalman Jewels M.D.   On: 07/06/2014 21:47    Scheduled Meds: . allopurinol  150 mg Oral Daily  . aspirin EC  81 mg Oral Once per day on Mon Wed Fri  . atorvastatin  10 mg Oral QPM  . B-complex with vitamin C  1 tablet Oral Daily  .  carvedilol  6.25 mg Oral BID WC  . enoxaparin (LOVENOX) injection  40 mg Subcutaneous Q24H  . levothyroxine  50 mcg Oral QAC breakfast  . lisinopril  20 mg Oral Daily  . multivitamin with minerals  1 tablet Oral Daily  . sodium chloride  3 mL Intravenous Q12H  . tamsulosin  0.4 mg Oral QHS  . vitamin C  500 mg Oral q morning - 10a   Continuous Infusions:   Principal Problem:   Hyponatremia Active Problems:   Ischemic cardiomyopathy   Multiple myeloma   Hypertension   Anemia   Systolic CHF, chronic   CAD (coronary artery disease)   Fall   Acute right hip pain   Frequent falls    Time spent: >30 minutes    Barton Dubois  [facility] Pager [phone]. If 7PM-7AM, please contact night-coverage at [email or web address], password [facility] 07/07/2014, 10:45 AM  LOS: 1 day

## 2014-07-07 NOTE — Progress Notes (Signed)
Timothy [name]   DOB:[birth year]   WJ#:[id number]   AOZ#:[id number]  Subjective: still pretty sore from the fall but comfortable and hungry; agitating to go home; no family in room   Objective: elderly White male examined in bed Filed Vitals:   07/07/14 0435  BP: 153/59  Pulse: 80  Temp: 98.2 F (36.8 C)  Resp: 20    Body mass index is 26.21 kg/(m^2).  Intake/Output Summary (Last 24 hours) at 07/07/14 0754 Last data filed at 07/07/14 9629  Gross per 24 hour  Intake      0 ml  Output   1350 ml  Net  -1350 ml     Sclerae unicteric  Oropharynx clear  Lungs no rales or rhonchi--auscultated anterolaterally  Heart regular rate and rhythm  Abdomen benign  Neuro nonfocal  CBG (last 3)  No results found for this basename: GLUCAP,  in the last 72 hours   Labs:  Lab Results  Component Value Date   WBC 8.2 07/06/2014   HGB 10.2* 07/06/2014   HCT 29.5* 07/06/2014   MCV 82.6 07/06/2014   PLT 174 07/06/2014   NEUTROABS 5.6 07/06/2014    '@LASTCHEMISTRY'$ @  Urine Studies No results found for this basename: UACOL, UAPR, USPG, UPH, UTP, UGL, UKET, UBIL, UHGB, UNIT, UROB, ULEU, UEPI, UWBC, URBC, UBAC, CAST, CRYS, UCOM, BILUA,  in the last 72 hours  Basic Metabolic Panel:  Recent Labs Lab 07/03/14 1053 07/06/14 0908 07/06/14 1845 07/07/14 0455  NA 121* 120* 120* 122*  K 4.7 4.8  --  4.6  CL  --  85*  --  87*  CO2 24 23  --  23  GLUCOSE 103 127*  --  117*  BUN 14.9 15  --  18  CREATININE 1.1 0.92  --  1.17  CALCIUM 9.2 9.2  --  9.2   GFR Estimated Creatinine Clearance: 49.4 ml/min (by C-G formula based on Cr of 1.17). Liver Function Tests:  Recent Labs Lab 07/03/14 1053  AST 22  ALT 12  ALKPHOS 56  BILITOT 0.53  PROT 7.0  ALBUMIN 3.6   No results found for this basename: LIPASE, AMYLASE,  in the last 168 hours No results found for this basename: AMMONIA,  in the last 168 hours Coagulation profile No results found for this basename: INR, PROTIME,  in the last 168  hours  CBC:  Recent Labs Lab 07/03/14 1052 07/06/14 0908  WBC 6.1 8.2  NEUTROABS 3.5 5.6  HGB 10.1* 10.2*  HCT 30.4* 29.5*  MCV 85.4 82.6  PLT 198 174   Cardiac Enzymes: No results found for this basename: CKTOTAL, CKMB, CKMBINDEX, TROPONINI,  in the last 168 hours BNP: No components found with this basename: POCBNP,  CBG: No results found for this basename: GLUCAP,  in the last 168 hours D-Dimer No results found for this basename: DDIMER,  in the last 72 hours Hgb A1c No results found for this basename: HGBA1C,  in the last 72 hours Lipid Profile No results found for this basename: CHOL, HDL, LDLCALC, TRIG, CHOLHDL, LDLDIRECT,  in the last 72 hours Thyroid function studies  Recent Labs  07/07/14 0454  TSH 7.300*   Anemia work up No results found for this basename: VITAMINB12, FOLATE, FERRITIN, TIBC, IRON, RETICCTPCT,  in the last 72 hours Microbiology No results found for this or any previous visit (from the past 240 hour(s)).  Kappa free light chain 0.33 - 1.94 mg/dL  61.00 (H)    59.50 (H)  59.40 (H)    49.10 (H)    71.00 (H)    69.50 (H)    43.00 (H)      Studies:  Dg Chest 2 View  07/06/2014   CLINICAL DATA:  Chest pain; recent trauma ; history of multiple myeloma  EXAM: CHEST  2 VIEW  COMPARISON:  February 04, 2014  FINDINGS: There is a nondisplaced fracture of the anterolateral right seventh rib. No pneumothorax or effusion.  There is a questionable nodular opacity in the left lower lobe measuring 1.5 x 1.5 cm. There is mild scarring in the left base. Elsewhere lungs are clear.  Heart is mildly enlarged with pulmonary vascularity within normal limits. Patient is status post coronary artery bypass grafting. No adenopathy.  IMPRESSION: 1.5 x 1.5 cm questionable nodular opacity left lower lobe seen only on frontal view. Advise noncontrast enhanced chest CT to further assess.  Nondisplaced fracture anterolateral right seventh rib. No pneumothorax.  No edema  or consolidation.  Mild scarring left base.   Electronically Signed   By: Lowella Grip M.D.   On: 07/06/2014 09:45   Dg Hip Complete Right  07/06/2014   CLINICAL DATA:  Pain post trauma ; history of multiple myeloma  EXAM: RIGHT HIP - COMPLETE 2+ VIEW  COMPARISON:  August 17, 2013  FINDINGS: Frontal pelvis as well as frontal and lateral right hip images were obtained. There is postoperative change in the proximal right femur. No acute fracture or dislocation is appreciable. There is moderate symmetric narrowing of both hip joints. There is no apparent blastic or lytic bone lesion. There is myositis ossificans in the area of previous trauma on the right. There is lower lumbar levoscoliosis. There are multiple foci of arterial vascular calcification.  IMPRESSION: Postoperative change proximal right femur. There is myositis ossificans in this area consistent with old trauma. No acute fracture or dislocation. Moderate symmetric narrowing of both hip joints.   Electronically Signed   By: Lowella Grip M.D.   On: 07/06/2014 09:41   Ct Chest Wo Contrast  07/06/2014   CLINICAL DATA:  Possible lung nodule.  EXAM: CT CHEST WITHOUT CONTRAST  TECHNIQUE: Multidetector CT imaging of the chest was performed following the standard protocol without IV contrast.  COMPARISON:  Chest x-ray 07/06/2014.  Remote chest CT from 2008.  FINDINGS: Chest wall:  No chest wall mass, supraclavicular or axillary lymphadenopathy. The thyroid gland appears normal. The bony thorax is intact. No destructive bone lesions or spinal canal compromise. There are remote healed rib fractures bilaterally. Surgical changes from bypass surgery are noted.  Mediastinum:  The heart is mildly enlarged. No pericardial effusion. No mediastinal or hilar mass or adenopathy. Small scattered lymph nodes are noted. There is moderate atherosclerotic change involving the aorta and branch vessels including the coronary arteries. The esophagus is grossly  normal.  Lungs:  Emphysematous changes are noted and slightly progressive when compared to prior CT scan. There is lower lobe predominant peribronchial thickening and increased interstitial markings. No focal airspace consolidation to suggest pneumonia. No worrisome pulmonary lesions. No pleural effusion.  Upper abdomen:  No significant findings.  IMPRESSION: 1. Emphysematous changes and bronchitic changes but no focal infiltrate or worrisome pulmonary lesion. 2. Moderate atherosclerotic calcifications involving the aorta and branch vessels. 3. Remote rib fractures bilaterally   Electronically Signed   By: Kalman Jewels M.D.   On: 07/06/2014 21:47    Assessment: 80 y.o. with kappa light chain multiple myeloma, admitted after a fall with Na 120  (  1) presenting April 2007 with anemia and renal failure; renal Bx showing free kappa light chain deposition; bone marrow biopsy showing 37% plasma cells, with normal cytogenetics and FISH;bone survey showing skull lytic lesions; treated with  (2) thalidomide (200 mg/d) and dexamethasone (40 mg/d x4d Q28d) June through Nov 2007, with good response, but poor tolerance;  (3) thalidomide decreased to 100 mg/d, dexamethasone continued, bortezomib (IV) added, Dec 2007 to March 2008  (4) treatment interrupted by multiple complications (peripheral neuropathy, pulmonary embolism, diverticular abscess, CN V zoster, severe DDD, congestive heart failure, rising PSA); maintenance zolendronic acid through March 2012  (5) progression April 2012, treated initially with dexamethasone alone, poorly tolerated;  (6) bortezomib (sq) resumed July 2012; cyclophosphamide and dexamethasone added Sept 2012; zolendronic acid changed to Q 25months; all treatments held as of March 2013 due to the development of the Left foot ulcer described above  (7) anemia, on aranesp December 2012 to August 2013; resumed Q14d starting May 2015  (8) status post Right trochanteric nail with proximal and  distal interlock  DePuy 11 mm one-pin lag screw, 44 mm distal interlock 08/09/2013 for a right comminuted intertrochanteric fracture  (9) status post removal of the first 2 toes left foot  (10) Left common femoral artery endarterectomy with vein patch angioplasty using left greater saphenous vein 03/24/2014 (11) chronic hyponatremia possibly due to duloxetine (12) emphysema but no pulmonary nodules (or bone lesions) on chest CT scan 07/06/2014     Plan: greatly appreciate nephrology's and hospitalist service's care to this patient!   Generally we instruct myeloma patients to drink 3 L fluid/ day-- there is some risk of renal damage from dehydration, but 1.2 L/day may be sufficient in the setting of quiescent light chain disease. Hopefully fluid restriction alone will take care of the Na problem and if due to duloxetine perhaps fluid restriction could be lifted very gradually in future w/o recurrence of the problem.  He is scheduled for follow-up in our office 9/11. Please let me know if I can be of help at this point. Otherwise will follow peripherally.     [name], MD 07/07/2014  7:54 AM

## 2014-07-07 NOTE — Progress Notes (Signed)
Admit: 07/06/2014 LOS: 1  44M w/ chronic, worsened euvolemic hypotonic hyponatremia with urine studies c/w SIADH (UOsm 428, U Na 77, low uric acid).  Possible contributors include duloxetine (stopped), mild hypothyroidism (doubt, on levothyroxine since 8/18).  Has hx/o Kappa LC MM since 2007, now in active surveillance.   Subjective:  No new events Limiting fluids Hasn't been able to see PT yet Req I/O cathterization this AM   08/17 0701 - 08/18 0700 In: -  Out: 1350 [IDPOE:[id number]]  Filed Weights   07/06/14 0855 07/06/14 1203 07/07/14 0537  Weight: 81.647 kg (180 lb) 81.6 kg (179 lb 14.3 oz) 82.872 kg (182 lb 11.2 oz)    Current meds: reviewed  Current Labs: reviewed    Physical Exam:  Blood pressure 153/59, pulse 80, temperature 98.2 F (36.8 C), temperature source Oral, resp. rate 20, height 5\' 10"  (1.778 m), weight 82.872 kg (182 lb 11.2 oz), SpO2 95.00%. GEN: NAD, appears well  ENT: NCAT, MMM  EYES: EOMI, anicteric  CV: RRR  PULM: CTAB, some basialr crackles that resolve with coughing  ABD: obese, s/nt/nd  SKIN: no rashes/lesions  EXT:No LEE   Assessment 1. Euvolemic hypotonic hyponatremia, suspect SIADH 2. Falls, loss of balance likely related #1 3. Neuropathic pain, now off duloxetine 2/2 #1 4. Chronic sHF from ICM on ACEi and BB 5. Kappal LC MM 6. Anemia on ESA with [place] 1. Cont 1.2L fluid restriction today 2. Cont to push solute in diet (protein, normal sodium intake) 3. Would hold on furosemide to see another 24h of trend; not sure it would be of great benefit 4. No role for vaptan or HT saline 5. Agree with thyroid replacement, but doubt this caused hyponatremia 6. PT eval pending 7. BID Na checks, ordered  Pearson Grippe MD 07/07/2014, 11:55 AM   Recent Labs Lab 07/03/14 1053 07/06/14 0908 07/06/14 1845 07/07/14 0455  NA 121* 120* 120* 122*  K 4.7 4.8  --  4.6  CL  --  85*  --  87*  CO2 24 23  --  23  GLUCOSE 103 127*  --  117*  BUN  14.9 15  --  18  CREATININE 1.1 0.92  --  1.17  CALCIUM 9.2 9.2  --  9.2    Recent Labs Lab 07/03/14 1052 07/06/14 0908  WBC 6.1 8.2  NEUTROABS 3.5 5.6  HGB 10.1* 10.2*  HCT 30.4* 29.5*  MCV 85.4 82.6  PLT 198 174

## 2014-07-08 DIAGNOSIS — D62 Acute posthemorrhagic anemia: Secondary | ICD-10-CM

## 2014-07-08 DIAGNOSIS — E876 Hypokalemia: Secondary | ICD-10-CM

## 2014-07-08 DIAGNOSIS — I5023 Acute on chronic systolic (congestive) heart failure: Secondary | ICD-10-CM

## 2014-07-08 LAB — RENAL FUNCTION PANEL
Albumin: 3.3 g/dL — ABNORMAL LOW (ref 3.5–5.2)
Anion gap: 11 (ref 5–15)
BUN: 23 mg/dL (ref 6–23)
CHLORIDE: 89 meq/L — AB (ref 96–112)
CO2: 24 mEq/L (ref 19–32)
CREATININE: 1.27 mg/dL (ref 0.50–1.35)
Calcium: 9.1 mg/dL (ref 8.4–10.5)
GFR calc Af Amer: 59 mL/min — ABNORMAL LOW (ref >90)
GFR calc non Af Amer: 50 mL/min — ABNORMAL LOW (ref >90)
GLUCOSE: 122 mg/dL — AB (ref 70–99)
POTASSIUM: 4.6 meq/L (ref 3.7–5.3)
Phosphorus: 4.6 mg/dL (ref 2.3–4.6)
Sodium: 124 mEq/L — ABNORMAL LOW (ref 137–147)

## 2014-07-08 LAB — CORTISOL-AM, BLOOD: Cortisol - AM: 11.7 ug/dL (ref 4.3–22.4)

## 2014-07-08 NOTE — Progress Notes (Signed)
Clinical Social Work Department CLINICAL SOCIAL WORK PLACEMENT NOTE 07/08/2014  Patient:  CHUKWUMA, STRAUS  Account Number:  [id number] Admit date:  07/06/2014  Clinical Social Worker:  Renold Genta  Date/time:  07/08/2014 02:47 PM  Clinical Social Work is seeking post-discharge placement for this patient at the following level of care:   SKILLED NURSING   (*CSW will update this form in Epic as items are completed)   07/08/2014  Patient/family provided with [facility] Department of Clinical Social Work's list of facilities offering this level of care within the geographic area requested by the patient (or if unable, by the patient's family).  07/08/2014  Patient/family informed of their freedom to choose among providers that offer the needed level of care, that participate in Medicare, Medicaid or managed care program needed by the patient, have an available bed and are willing to accept the patient.  07/08/2014  Patient/family informed of [facility]' ownership interest in [facility], as well as of the fact that they are under no obligation to receive care at this facility.  PASARR submitted to EDS on 07/08/2014 PASARR number received on 07/08/2014  FL2 transmitted to all facilities in geographic area requested by pt/family on  07/08/2014 FL2 transmitted to all facilities within larger geographic area on   Patient informed that his/her managed care company has contracts with or will negotiate with  certain facilities, including the following:     Patient/family informed of bed offers received:  07/08/2014 Patient chooses bed at  Physician recommends and patient chooses bed at    Patient to be transferred to  on   Patient to be transferred to facility by  Patient and family notified of transfer on  Name of family member notified:    The following physician request were entered in Epic:   Additional Comments:   [name], [place] Social Worker cell #: [phone]

## 2014-07-08 NOTE — Progress Notes (Signed)
Admit: 07/06/2014 LOS: 2  38M w/ chronic, worsened euvolemic hypotonic hyponatremia with urine studies c/w SIADH (UOsm 428, U Na 77, low uric acid).  Possible contributors include duloxetine (stopped), mild hypothyroidism (doubt, on levothyroxine since 8/18).  Has hx/o Kappa LC MM since 2007, now in active surveillance.   Subjective:  No new events Working with PT Has chosen with some reservation to go to SNF Na 124 this AM Limiting fluids  08/18 0701 - 08/19 0700 In: 720 [P.O.:720] Out: 860 [Urine:860]  Filed Weights   07/06/14 1203 07/07/14 0537 07/08/14 0412  Weight: 81.6 kg (179 lb 14.3 oz) 82.872 kg (182 lb 11.2 oz) 82.101 kg (181 lb)    Current meds: reviewed  Current Labs: reviewed    Physical Exam:  Blood pressure 123/60, pulse 68, temperature 97.8 F (36.6 C), temperature source Oral, resp. rate 18, height 5\' 10"  (1.778 m), weight 82.101 kg (181 lb), SpO2 95.00%. GEN: NAD, appears well, in chair  ENT: NCAT, MMM  EYES: EOMI, anicteric  CV: RRR  PULM: CTAB ABD: obese, s/nt/nd  SKIN: no rashes/lesions  EXT:No LEE   Assessment 1. Euvolemic hypotonic hyponatremia, suspect SIADH 2. Falls, loss of balance likely related #1 3. Neuropathic pain, now off duloxetine 2/2 #1 4. Chronic sHF from ICM on ACEi and BB 5. Kappal LC MM 6. Anemia on ESA with [place] 1. Cont 1.2L fluid restriction today 2. Recheck U [place] today 3. Still don't think adding furosemide or NaCl tabs are necessary; slow correction is reasonable 4. Cont to push solute in diet (protein, normal sodium intake) 5. No role for vaptan or HT saline 6. Agree with thyroid replacement, but doubt this caused hyponatremia 7. PT eval rec SNF, pt agrees 8. BID Na checks, ordered  Pearson Grippe MD 07/08/2014, 1:04 PM   Recent Labs Lab 07/06/14 0908  07/07/14 0455 07/07/14 1558 07/08/14 0442  NA 120*  < > 122* 120* 124*  K 4.8  --  4.6  --  4.6  CL 85*  --  87*  --  89*  CO2 23  --  23  --  24   GLUCOSE 127*  --  117*  --  122*  BUN 15  --  18  --  23  CREATININE 0.92  --  1.17  --  1.27  CALCIUM 9.2  --  9.2  --  9.1  PHOS  --   --   --   --  4.6  < > = values in this interval not displayed.  Recent Labs Lab 07/03/14 1052 07/06/14 0908  WBC 6.1 8.2  NEUTROABS 3.5 5.6  HGB 10.1* 10.2*  HCT 30.4* 29.5*  MCV 85.4 82.6  PLT 198 174

## 2014-07-08 NOTE — Progress Notes (Signed)
Clinical Social Work Department BRIEF PSYCHOSOCIAL ASSESSMENT 07/08/2014  Patient:  Timothy [name], Timothy [name]     Account Number:  [id number]     Admit date:  07/06/2014  Clinical Social Worker:  Renold Genta  Date/Time:  07/08/2014 02:43 PM  Referred by:  Physician  Date Referred:  07/08/2014 Referred for  SNF Placement   Other Referral:   Interview type:  Patient Other interview type:   and wife, [name] at bedside    PSYCHOSOCIAL DATA Living Status:  WIFE Admitted from facility:   Level of care:   Primary support name:  Timothy [name] (wife) h#: [phone] c#: [phone] Primary support relationship to patient:  SPOUSE Degree of support available:   good    CURRENT CONCERNS Current Concerns  Post-Acute Placement   Other Concerns:    SOCIAL WORK ASSESSMENT / PLAN CSW reviewed PT evaluation recommending SNF at discharge.   Assessment/plan status:  Information/Referral to [employer] Other assessment/ plan:   Information/referral to community resources:   CSW completed FL2 and faxed information out to [facility] - provided bed offers.    PATIENT'S/FAMILY'S RESPONSE TO PLAN OF CARE: Patient informed CSW that he had been to [facility] last fall after he fractured his hip, but would prefer to go to a rehab facility that is not as intense. CSW provided SNF bed offers - wife requested [facility] as it's close to their house in [place]. CSW left message for [facility], awaiting call back re: bed availability.       [name], [facility] Clinical Social Worker cell #: [phone]

## 2014-07-08 NOTE — Progress Notes (Signed)
Physical Therapy Treatment Patient Details Name: Timothy [name] MRN: [id number] DOB: 1928/06/02 Today's Date: 07/08/2014    History of Present Illness 80 yo male admitted 07/06/14 after he had a fall at home and landed on his right hip. He had difficulty getting up and walking after that. Pt had a R  hip ORIF in 10/14.  Xray negative for fracture of R hip    PT Comments    Pt able to tolerate short distance ambulation around room today with increased effort and difficulty.  Pt reporting significant R hip and LE pain despite premedication so modified activity and exercises within more comfort.  Pt now agreeable to d/c to SNF.   Follow Up Recommendations  SNF     Equipment Recommendations  None recommended by PT    Recommendations for Other Services       Precautions / Restrictions Precautions Precautions: Fall Precaution Comments: R hip is very painful    Mobility  Bed Mobility Overal bed mobility: Needs Assistance;+2 for physical assistance;+ 2 for safety/equipment Bed Mobility: Supine to Sit     Supine to sit: HOB elevated;+2 for physical assistance;+2 for safety/equipment;Max assist     General bed mobility comments: pt continues to have difficulty moving LEs to EOB so required assist for lower body and kept LEs together per pt request for pain control, also required assist for trunk upright  Transfers Overall transfer level: Needs assistance Equipment used: Rolling walker (2 wheeled) Transfers: Sit to/from Stand Sit to Stand: +2 safety/equipment;+2 physical assistance;From elevated surface;Mod assist         General transfer comment: verbal cues for LE and UE placement, cues for weight shifting  Ambulation/Gait Ambulation/Gait assistance: Mod assist;+2 physical assistance;+2 safety/equipment Ambulation Distance (Feet): 14 Feet Assistive device: Rolling walker (2 wheeled) Gait Pattern/deviations: Step-to pattern;Antalgic;Trunk flexed;Ataxic Gait velocity:  decr   General Gait Details: pt tends to keep knees in more extension throughout gait due to fear of buckling, increased diffculty and effort advancing each LE, recliner brought behind pt   Stairs            Wheelchair Mobility    Modified Rankin (Stroke Patients Only)       Balance                                    Cognition Arousal/Alertness: Awake/alert Behavior During Therapy: WFL for tasks assessed/performed Overall Cognitive Status: Within Functional Limits for tasks assessed                      Exercises General Exercises - Lower Extremity Long Arc Quad: AROM;Both;Seated;10 reps Heel Slides: AAROM;Supine;Both;10 reps Hip ABduction/ADduction: AAROM;Both;10 reps;Supine Hip Flexion/Marching: 10 reps;Seated;Both;Limitations;AROM Hip Flexion/Marching Limitations: more difficulty and pain with R hip flexion so limited range to comfort    General Comments        Pertinent Vitals/Pain Pain Assessment: 0-10 Pain Score: 8  Pain Location: R hip and thigh Pain Descriptors / Indicators: Aching;Sore Pain Intervention(s): Limited activity within patient's tolerance;Monitored during session;Premedicated before session;Repositioned    Home Living                      Prior Function            PT Goals (current goals can now be found in the care plan section) Progress towards PT goals: Progressing toward goals    Frequency  Min  3X/week    PT Plan Current plan remains appropriate    Co-evaluation             End of Session Equipment Utilized During Treatment: Gait belt Activity Tolerance: Patient limited by pain Patient left: in chair;with call bell/phone within reach;with chair alarm set;with family/visitor present     Time: 8675-4492 PT Time Calculation (min): 30 min  Charges:  $Gait Training: 8-22 mins $Therapeutic Exercise: 8-22 mins                    G Codes:      Lanny Lipkin,KATHrine E 07-Aug-2014, 1:04  PM Carmelia Bake, PT, DPT 08-07-14 Pager: [phone]

## 2014-07-08 NOTE — Progress Notes (Signed)
[facility] PROGRESS NOTE  Timothy [name] YSA:[id number] DOB: 08/31/1928 DOA: 07/06/2014 PCP: [name], MD  Assessment/Plan:  Hyponatremia: appears to be multifactorial; potential mild SIADH vs effect from medications (cymbalta) vs hypothyroidism. -renal on board and will follow rec's -for now continue fluid restriction -cymbalta has been discontinued -TSH elevated; will start synthroid and check free T4 -Strict fluid intake to 1.2 L per day, appreciated nephrology help.  Frequent falls, weakness and deconditioning: will ask Pt to evaluate - Is likely secondary to hyponatremia.  ?? Lung nodule on CXR:  -CT scan of the chest on 07/06/14 with showing no evidence of pulmonary nodules. Likely x-ray finding is false positive.  Ischemic cardiomyopathy: euvolemic. No CP or SOB. -continue current medication regimen (ASA, coreg and lisinopril) -continue follow up with cardiology  MM: Appreciate Dr. [name] help  Chronic low back pain and body aches from falling episode: continue PRN analgesics -PT has been asked to evaluate and provide rec's -There is a big bruise upon the right hip, x-ray from the 8/17 showed no evidence of acute events.  HTN: fair well controlled. Continue current meds  Hypothyroidism: elevated TSH. -will start synthroid at 50 mcg daily -check Free T4  BPH and acute urinary retention: will continue flomax and follow closely bladder scan. -had 1 episode requiring in and out cath (bladder scan at that moment with almost 400 cc) -if further retention present will place foley and will need urology inputs.  Anemia of chronic disease: stable and at baseline. -monitor Hgb trend -no overt bleeding appreciated   Code Status: Full Family Communication:  Disposition Plan: to be determined, SNF   Consultants:  Nephrology  Oncology informed of admission  Procedures:  See below for x-ray reports    Antibiotics:  None    HPI/Subjective: Feeling ok overall. No nausea, vomiting or abd pain. Patient with urinary retention overnight requiring In and out cath. Weak and tired. Sodium slowly improving.  Objective: Filed Vitals:   07/08/14 0854  BP: 123/60  Pulse: 68  Temp:   Resp:     Intake/Output Summary (Last 24 hours) at 07/08/14 1305 Last data filed at 07/08/14 1046  Gross per 24 hour  Intake    483 ml  Output   1360 ml  Net   -877 ml   Filed Weights   07/06/14 1203 07/07/14 0537 07/08/14 0412  Weight: 81.6 kg (179 lb 14.3 oz) 82.872 kg (182 lb 11.2 oz) 82.101 kg (181 lb)    Exam:   General:  Feeling weak and tired; complaining of body aches. No fever, no CP or SOB  Cardiovascular: S1 and S2, no rubs or gallops  Respiratory: CTA bilaterally  Abdomen: soft, NT, ND, positive BS  Musculoskeletal: no edema or cyanosis  Data Reviewed: Basic Metabolic Panel:  Recent Labs Lab 07/03/14 1053 07/06/14 0908 07/06/14 1845 07/07/14 0455 07/07/14 1558 07/08/14 0442  NA 121* 120* 120* 122* 120* 124*  K 4.7 4.8  --  4.6  --  4.6  CL  --  85*  --  87*  --  89*  CO2 24 23  --  23  --  24  GLUCOSE 103 127*  --  117*  --  122*  BUN 14.9 15  --  18  --  23  CREATININE 1.1 0.92  --  1.17  --  1.27  CALCIUM 9.2 9.2  --  9.2  --  9.1  PHOS  --   --   --   --   --  4.6   Liver Function Tests:  Recent Labs Lab 07/03/14 1053 07/08/14 0442  AST 22  --   ALT 12  --   ALKPHOS 56  --   BILITOT 0.53  --   PROT 7.0  --   ALBUMIN 3.6 3.3*   CBC:  Recent Labs Lab 07/03/14 1052 07/06/14 0908  WBC 6.1 8.2  NEUTROABS 3.5 5.6  HGB 10.1* 10.2*  HCT 30.4* 29.5*  MCV 85.4 82.6  PLT 198 174   BNP (last 3 results)  Recent Labs  06/22/14 1146  PROBNP 88.0     Studies: Ct Chest Wo Contrast  07/06/2014   CLINICAL DATA:  Possible lung nodule.  EXAM: CT CHEST WITHOUT CONTRAST  TECHNIQUE: Multidetector CT imaging of the chest was performed following the standard protocol without IV  contrast.  COMPARISON:  Chest x-ray 07/06/2014.  Remote chest CT from 2008.  FINDINGS: Chest wall:  No chest wall mass, supraclavicular or axillary lymphadenopathy. The thyroid gland appears normal. The bony thorax is intact. No destructive bone lesions or spinal canal compromise. There are remote healed rib fractures bilaterally. Surgical changes from bypass surgery are noted.  Mediastinum:  The heart is mildly enlarged. No pericardial effusion. No mediastinal or hilar mass or adenopathy. Small scattered lymph nodes are noted. There is moderate atherosclerotic change involving the aorta and branch vessels including the coronary arteries. The esophagus is grossly normal.  Lungs:  Emphysematous changes are noted and slightly progressive when compared to prior CT scan. There is lower lobe predominant peribronchial thickening and increased interstitial markings. No focal airspace consolidation to suggest pneumonia. No worrisome pulmonary lesions. No pleural effusion.  Upper abdomen:  No significant findings.  IMPRESSION: 1. Emphysematous changes and bronchitic changes but no focal infiltrate or worrisome pulmonary lesion. 2. Moderate atherosclerotic calcifications involving the aorta and branch vessels. 3. Remote rib fractures bilaterally   Electronically Signed   By: Kalman Jewels M.D.   On: 07/06/2014 21:47    Scheduled Meds: . allopurinol  150 mg Oral Daily  . aspirin EC  81 mg Oral Once per day on Mon Wed Fri  . atorvastatin  10 mg Oral QPM  . B-complex with vitamin C  1 tablet Oral Daily  . carvedilol  6.25 mg Oral BID WC  . enoxaparin (LOVENOX) injection  40 mg Subcutaneous Q24H  . levothyroxine  50 mcg Oral QAC breakfast  . lisinopril  20 mg Oral Daily  . multivitamin with minerals  1 tablet Oral Daily  . rOPINIRole  2.25 mg Oral QHS  . sodium chloride  3 mL Intravenous Q12H  . tamsulosin  0.4 mg Oral QHS  . vitamin C  500 mg Oral q morning - 10a   Continuous Infusions:   Principal  Problem:   Hyponatremia Active Problems:   Ischemic cardiomyopathy   Multiple myeloma   Hypertension   Anemia   Systolic CHF, chronic   CAD (coronary artery disease)   Fall   Acute right hip pain   Frequent falls    Time spent: >30 minutes    [facility] Pager [phone]. If 7PM-7AM, please contact night-coverage at [email or web address], password [facility] 07/08/2014, 1:05 PM  LOS: 2 days

## 2014-07-09 LAB — RENAL FUNCTION PANEL
ANION GAP: 12 (ref 5–15)
Albumin: 3.1 g/dL — ABNORMAL LOW (ref 3.5–5.2)
BUN: 27 mg/dL — ABNORMAL HIGH (ref 6–23)
CHLORIDE: 94 meq/L — AB (ref 96–112)
CO2: 23 meq/L (ref 19–32)
Calcium: 9.1 mg/dL (ref 8.4–10.5)
Creatinine, Ser: 1.12 mg/dL (ref 0.50–1.35)
GFR calc Af Amer: 68 mL/min — ABNORMAL LOW (ref >90)
GFR, EST NON AFRICAN AMERICAN: 59 mL/min — AB (ref >90)
Glucose, Bld: 121 mg/dL — ABNORMAL HIGH (ref 70–99)
POTASSIUM: 4.5 meq/L (ref 3.7–5.3)
Phosphorus: 4.3 mg/dL (ref 2.3–4.6)
Sodium: 129 mEq/L — ABNORMAL LOW (ref 137–147)

## 2014-07-09 LAB — OSMOLALITY, URINE: OSMOLALITY UR: 423 mosm/kg (ref 390–1090)

## 2014-07-09 NOTE — Progress Notes (Signed)
[facility] PROGRESS NOTE  Timothy [name] FFM:[id number] DOB: May 23, 1928 DOA: 07/06/2014 PCP: [name], MD   HPI/Subjective: Denies any significant complaints. Patient is very appreciative of the hospital staff.  Assessment/Plan:  Hyponatremia:  -Suspect mild SIADH vs effect from medications (cymbalta) vs hypothyroidism. -for now continue fluid restriction -cymbalta has been discontinued -Nephrology recommended 1.5 L fluid restriction as outpatient. -Appreciate Dr. Joelyn Oms help.  Frequent falls, weakness and deconditioning: will ask Pt to evaluate - Is likely secondary to hyponatremia. PT recommended to SNF.  ?? Lung nodule on CXR:  -CT scan of the chest on 07/06/14 with showing no evidence of pulmonary nodules. Likely x-ray finding is false positive.  Ischemic cardiomyopathy: euvolemic. No CP or SOB. -continue current medication regimen (ASA, coreg and lisinopril) -continue follow up with cardiology  MM: Appreciate Dr. [name] help  Chronic low back pain and body aches from falling episode: continue PRN analgesics -PT has been asked to evaluate and provide rec's -There is a big bruise upon the right hip, x-ray from the 8/17 showed no evidence of acute events.  HTN: fair well controlled. Continue current meds  Hypothyroidism: elevated TSH. -will start synthroid at 50 mcg daily -check Free T4  BPH and acute urinary retention: will continue flomax and follow closely bladder scan. -had 1 episode requiring in and out cath (bladder scan at that moment with almost 400 cc) -if further retention present will place foley and will need urology inputs.  Anemia of chronic disease: stable and at baseline. -monitor Hgb trend -no overt bleeding appreciated   Code Status: Full Family Communication:  Disposition Plan: SNF in AM.   Consultants:  Nephrology  Oncology informed of admission  Procedures:  See below for x-ray reports    Antibiotics:  None     Objective: Filed Vitals:   07/09/14 0510  BP: 139/59  Pulse: 74  Temp: 97.8 F (36.6 C)  Resp: 18    Intake/Output Summary (Last 24 hours) at 07/09/14 1103 Last data filed at 07/09/14 1023  Gross per 24 hour  Intake      0 ml  Output   1105 ml  Net  -1105 ml   Filed Weights   07/07/14 0537 07/08/14 0412 07/09/14 0510  Weight: 82.872 kg (182 lb 11.2 oz) 82.101 kg (181 lb) 82.1 kg (181 lb)    Exam:   General:  Feeling weak and tired; complaining of body aches. No fever, no CP or SOB  Cardiovascular: S1 and S2, no rubs or gallops  Respiratory: CTA bilaterally  Abdomen: soft, NT, ND, positive BS  Musculoskeletal: no edema or cyanosis  Data Reviewed: Basic Metabolic Panel:  Recent Labs Lab 07/03/14 1053  07/06/14 0908 07/06/14 1845 07/07/14 0455 07/07/14 1558 07/08/14 0442 07/09/14 0431  NA 121*  < > 120* 120* 122* 120* 124* 129*  K 4.7  --  4.8  --  4.6  --  4.6 4.5  CL  --   --  85*  --  87*  --  89* 94*  CO2 24  --  23  --  23  --  24 23  GLUCOSE 103  --  127*  --  117*  --  122* 121*  BUN 14.9  --  15  --  18  --  23 27*  CREATININE 1.1  --  0.92  --  1.17  --  1.27 1.12  CALCIUM 9.2  --  9.2  --  9.2  --  9.1 9.1  PHOS  --   --   --   --   --   --  4.6 4.3  < > = values in this interval not displayed. Liver Function Tests:  Recent Labs Lab 07/03/14 1053 07/08/14 0442 07/09/14 0431  AST 22  --   --   ALT 12  --   --   ALKPHOS 56  --   --   BILITOT 0.53  --   --   PROT 7.0  --   --   ALBUMIN 3.6 3.3* 3.1*   CBC:  Recent Labs Lab 07/03/14 1052 07/06/14 0908  WBC 6.1 8.2  NEUTROABS 3.5 5.6  HGB 10.1* 10.2*  HCT 30.4* 29.5*  MCV 85.4 82.6  PLT 198 174   BNP (last 3 results)  Recent Labs  06/22/14 1146  PROBNP 88.0     Studies: No results found.  Scheduled Meds: . allopurinol  150 mg Oral Daily  . aspirin EC  81 mg Oral Once per day on Mon Wed Fri  . atorvastatin  10 mg Oral QPM  . B-complex with vitamin C  1 tablet  Oral Daily  . carvedilol  6.25 mg Oral BID WC  . enoxaparin (LOVENOX) injection  40 mg Subcutaneous Q24H  . levothyroxine  50 mcg Oral QAC breakfast  . lisinopril  20 mg Oral Daily  . multivitamin with minerals  1 tablet Oral Daily  . rOPINIRole  2.25 mg Oral QHS  . sodium chloride  3 mL Intravenous Q12H  . tamsulosin  0.4 mg Oral QHS  . vitamin C  500 mg Oral q morning - 10a   Continuous Infusions:   Principal Problem:   Hyponatremia Active Problems:   Ischemic cardiomyopathy   Multiple myeloma   Hypertension   Anemia   Systolic CHF, chronic   CAD (coronary artery disease)   Fall   Acute right hip pain   Frequent falls    Time spent: >30 minutes    [facility] Pager [phone]. If 7PM-7AM, please contact night-coverage at [email or web address], password [facility] 07/09/2014, 11:02 AM  LOS: 3 days

## 2014-07-09 NOTE — Progress Notes (Signed)
Physical Therapy Treatment Patient Details Name: Timothy [name] MRN: [id number] DOB: 02-16-1928 Today's Date: 07/09/2014    History of Present Illness 80 yo male admitted 07/06/14 after he had a fall at home and landed on his right hip. He had difficulty getting up and walking after that. Pt had a R  hip ORIF in 10/14.  Xray negative for fracture of R hip    PT Comments    Pt ambulated in room and has improved with mobility however still presents with deficits.  Follow Up Recommendations  SNF     Equipment Recommendations  None recommended by PT    Recommendations for Other Services       Precautions / Restrictions Precautions Precautions: Fall Precaution Comments: R hip is very painful Restrictions Weight Bearing Restrictions: No    Mobility  Bed Mobility Overal bed mobility: Needs Assistance;+2 for physical assistance;+ 2 for safety/equipment Bed Mobility: Supine to Sit     Supine to sit: HOB elevated;+2 for physical assistance;+2 for safety/equipment;Mod assist     General bed mobility comments: pt continues to have difficulty moving LEs to EOB so required assist for lower body and kept LEs together per pt request for pain control, utilized bed pad for positioning as well, also required assist for trunk upright  Transfers Overall transfer level: Needs assistance Equipment used: Rolling walker (2 wheeled) Transfers: Sit to/from Stand Sit to Stand: +2 safety/equipment;+2 physical assistance;From elevated surface;Mod assist         General transfer comment: verbal cues for LE and UE placement, cues for weight shifting, tends to lose balance posteriorly upon standing  Ambulation/Gait Ambulation/Gait assistance: Min assist;+2 safety/equipment Ambulation Distance (Feet): 20 Feet Assistive device: Rolling walker (2 wheeled) Gait Pattern/deviations: Step-to pattern;Antalgic;Trunk flexed;Ataxic Gait velocity: decr   General Gait Details: pt tends to keep knees in  more extension, LEs appear stiff during gait, increased diffculty and effort advancing each LE   Stairs            Wheelchair Mobility    Modified Rankin (Stroke Patients Only)       Balance                                    Cognition Arousal/Alertness: Awake/alert Behavior During Therapy: WFL for tasks assessed/performed Overall Cognitive Status: Within Functional Limits for tasks assessed                      Exercises      General Comments        Pertinent Vitals/Pain Pain Assessment: 0-10 Pain Score: 5  Pain Location: R hip and thigh Pain Descriptors / Indicators: Aching;Sore Pain Intervention(s): Limited activity within patient's tolerance;Monitored during session;Premedicated before session;Repositioned    Home Living                      Prior Function            PT Goals (current goals can now be found in the care plan section) Progress towards PT goals: Progressing toward goals    Frequency  Min 3X/week    PT Plan Current plan remains appropriate    Co-evaluation             End of Session Equipment Utilized During Treatment: Gait belt Activity Tolerance: Patient limited by pain Patient left: in chair;with call bell/phone within reach;with chair alarm set;with family/visitor present  Time: 1046-1100 PT Time Calculation (min): 14 min  Charges:  $Gait Training: 8-22 mins                    G Codes:      Aldine Chakraborty,KATHrine E 07-29-2014, 12:54 PM Carmelia Bake, PT, DPT Jul 29, 2014 Pager: [phone]

## 2014-07-09 NOTE — Progress Notes (Signed)
Admit: 07/06/2014 LOS: 3  24M w/ chronic, worsened euvolemic hypotonic hyponatremia with urine studies c/w SIADH (UOsm 428, U Na 77, low uric acid).  Possible contributors include duloxetine (stopped), mild hypothyroidism (doubt, on levothyroxine since 8/18).  Has hx/o Kappa LC MM since 2007, now in active surveillance.   Subjective:  No new events Likey to SNF near to home tomorrow Feels well Working with PT Na to 129; UOsms remained on high side in 400s  08/19 0701 - 08/20 0700 In: 243 [P.O.:240; I.V.:3] Out: 1125 [Urine:1125]  Filed Weights   07/07/14 0537 07/08/14 0412 07/09/14 0510  Weight: 82.872 kg (182 lb 11.2 oz) 82.101 kg (181 lb) 82.1 kg (181 lb)    Current meds: reviewed  Current Labs: reviewed    Physical Exam:  Blood pressure 139/59, pulse 74, temperature 97.8 F (36.6 C), temperature source Oral, resp. rate 18, height 5\' 10"  (1.778 m), weight 82.1 kg (181 lb), SpO2 98.00%. GEN: NAD, appears well, in chair  ENT: NCAT, MMM  EYES: EOMI, anicteric  CV: RRR  PULM: CTAB ABD: obese, s/nt/nd  SKIN: no rashes/lesions  EXT:No LEE   Assessment 1. Euvolemic hypotonic hyponatremia, suspect SIADH primarily related to duloxetine 2. Falls, loss of balance likely related #1 3. Neuropathic pain, now off duloxetine 2/2 #1 4. Chronic sHF from ICM on ACEi and BB 5. Kappal LC MM 6. Anemia on ESA with [place] 1.  I think upon discharge 1.5L fluid restriction is reasonable; he still has some SIADH physiology 2. Pt ok for d/c.  Will arrange f/u appt with [name]; need renal panel early next week 3. Adding furosemide or NaCl tabs are not necessary 4. Cont to push solute in diet (protein, normal sodium intake) 5. Agree with thyroid replacement, but doubt this caused hyponatremia  Will sign of for now.  Appt and f/u labs beign arranged.  Pearson Grippe MD 07/09/2014, 10:06 AM   Recent Labs Lab 07/07/14 0455 07/07/14 1558 07/08/14 0442 07/09/14 0431  NA 122*  120* 124* 129*  K 4.6  --  4.6 4.5  CL 87*  --  89* 94*  CO2 23  --  24 23  GLUCOSE 117*  --  122* 121*  BUN 18  --  23 27*  CREATININE 1.17  --  1.27 1.12  CALCIUM 9.2  --  9.1 9.1  PHOS  --   --  4.6 4.3    Recent Labs Lab 07/03/14 1052 07/06/14 0908  WBC 6.1 8.2  NEUTROABS 3.5 5.6  HGB 10.1* 10.2*  HCT 30.4* 29.5*  MCV 85.4 82.6  PLT 198 174

## 2014-07-09 NOTE — Progress Notes (Signed)
Physical Therapy Treatment Patient Details Name: Timothy [name] MRN: [id number] DOB: [birth year] Today's Date: 07/09/2014    History of Present Illness 80 yo male admitted 07/06/14 after he had a fall at home and landed on his right hip. He had difficulty getting up and walking after that. Pt had a R  hip ORIF in 10/14.  Xray negative for fracture of R hip    PT Comments    Returned to ambulate again prior to returning to bed per pt request.  Pt reports increased stiffness from sitting in recliner however improved mobility today observed.  Pt plans to d/c to SNF tomorrow to continue rehab.  Follow Up Recommendations  SNF     Equipment Recommendations  None recommended by PT    Recommendations for Other Services       Precautions / Restrictions Precautions Precautions: Fall Precaution Comments: R hip is very painful    Mobility  Bed Mobility Overal bed mobility: Needs Assistance Bed Mobility: Sit to Supine     Supine to sit: HOB elevated;+2 for physical assistance;+2 for safety/equipment;Mod assist Sit to supine: Mod assist;+2 for safety/equipment   General bed mobility comments: assist for LEs onto bed  Transfers Overall transfer level: Needs assistance Equipment used: Rolling walker (2 wheeled) Transfers: Sit to/from Stand Sit to Stand: Min assist;+2 safety/equipment         General transfer comment: verbal cues for UE placement, better sit to stand from recliner (vs bed from this morning)  Ambulation/Gait Ambulation/Gait assistance: Min assist;+2 safety/equipment Ambulation Distance (Feet): 20 Feet Assistive device: Rolling walker (2 wheeled) Gait Pattern/deviations: Step-to pattern;Trunk flexed;Ataxic Gait velocity: decr   General Gait Details: pt able to perform more hip and knee flexion this afternoon however still remains in increased extension for gait pattern   Stairs            Wheelchair Mobility    Modified Rankin (Stroke Patients Only)       Balance                                    Cognition Arousal/Alertness: Awake/alert Behavior During Therapy: WFL for tasks assessed/performed Overall Cognitive Status: Within Functional Limits for tasks assessed                      Exercises      General Comments        Pertinent Vitals/Pain Pain Assessment: 0-10 Pain Score: 6  Pain Location: R hip and thigh Pain Descriptors / Indicators: Sore;Aching Pain Intervention(s): Monitored during session;Repositioned    Home Living                      Prior Function            PT Goals (current goals can now be found in the care plan section) Progress towards PT goals: Progressing toward goals    Frequency  Min 3X/week    PT Plan Current plan remains appropriate    Co-evaluation             End of Session Equipment Utilized During Treatment: Gait belt Activity Tolerance: Patient tolerated treatment well Patient left: with call bell/phone within reach;with family/visitor present;in bed;with bed alarm set     Time: 1410-1423 PT Time Calculation (min): 13 min  Charges:  $Gait Training: 8-22 mins  G CodesTrena Platt 08-Aug-2014, 3:14 PM Carmelia Bake, PT, DPT 08-08-2014 Pager: [phone]

## 2014-07-10 LAB — RENAL FUNCTION PANEL
ALBUMIN: 3.2 g/dL — AB (ref 3.5–5.2)
Anion gap: 11 (ref 5–15)
BUN: 23 mg/dL (ref 6–23)
CALCIUM: 9.2 mg/dL (ref 8.4–10.5)
CO2: 24 mEq/L (ref 19–32)
Chloride: 94 mEq/L — ABNORMAL LOW (ref 96–112)
Creatinine, Ser: 1.03 mg/dL (ref 0.50–1.35)
GFR, EST AFRICAN AMERICAN: 75 mL/min — AB (ref >90)
GFR, EST NON AFRICAN AMERICAN: 65 mL/min — AB (ref >90)
Glucose, Bld: 116 mg/dL — ABNORMAL HIGH (ref 70–99)
PHOSPHORUS: 4.2 mg/dL (ref 2.3–4.6)
Potassium: 4.5 mEq/L (ref 3.7–5.3)
SODIUM: 129 meq/L — AB (ref 137–147)

## 2014-07-10 MED ORDER — HYDROMORPHONE HCL 4 MG PO TABS: 8.0000 mg | ORAL_TABLET | ORAL | Status: DC | PRN

## 2014-07-10 MED ORDER — LEVOTHYROXINE SODIUM 50 MCG PO TABS: 50.0000 ug | ORAL_TABLET | Freq: Every day | ORAL | Status: DC

## 2014-07-10 NOTE — Consult Note (Signed)
Physical Medicine and Rehabilitation Consult  Reason for Consult:  Left hip contusion, gait disorder with falls.   Referring Physician:  Dr. Hartford Poli   HPI: Timothy [name] is a 80 y.o. male with history of MM, CM,PVD, balance disorder,  who was admitted on 07/06/14 past fall on left hip with difficulty walking. No LOC or dizziness. X rays without fracture but patient with left hip contusion and pain affecting mobility. He was noted to have hyponatremia and nephrology consulted for input. Work up with euvolemic hypotonic hyponatremia likely due to SIADH and likely leading to gait disorder and falls.  He was was placed on fluid restriction with recommendations to push oral intake of solute and follow up with Nephrology past discharge.  Therapy initiated and patient with ataxic gait as well as poor posture. SNF recommended by PT and family would prefer facility closer to home.  MD recommending CIR.    ROS  Past Medical History  Diagnosis Date  . ASCVD (arteriosclerotic cardiovascular disease)   . Anemia     chronic mild anemia  . Gout   . Hypertension   . Lumbar disc disease     post laminectomy  . Chronic back pain   . History of echocardiogram 11/02/2010    EF range of 30 to 35% / There is hypokinsesis of the basal-mild inferolateral myocardium  . CHF (congestive heart failure)   . Heart murmur   . Ischemic cardiomyopathy   . Renal insufficiency   . Peripheral neuropathy   . Inferior myocardial infarction 1986  . Hyperlipidemia   . SOB (shortness of breath)   . Fatigue   . Peripheral neuropathy   . Coronary artery disease     CABG 2002 by Dr. Roxy Manns with LIMA to LAD, SVG to intermediate, SVG to LCX, & SVG to PL. Last nuclear in 2012 showing large inferior scar with EF of 39%.   . Blood transfusion     DEC 2012 - TWO UNITS  . Myeloma     Multiple myeloma, with recurrence of increasing problems  DR. Union -ONCOLOGIST.  PT HAS BEEN OFF CHEMO SINCE HIS HOSPITALIZATION FEB  2013 FOR LEFT FOOT INFECTION  . Cancer   . History of shingles MARCH 2009    SHINGLE LESIONS WERE AROUND RIGHT EYE--PT HAS RESIDUAL ITCHING AROUND THE EYE.  . Osteomyelitis     s/p toe amputation  . Chronic systolic heart failure     EF of 30% per echo 02/2012  . Protein malnutrition   . Critical lower limb ischemia    Past Surgical History  Procedure Laterality Date  . Laminectomy    . Inguinal hernia repair    . Cardiac catheterization  06/20/2000    Severe coronary disease (totally occluded right artery, 90% left circumflex, 50-60% intermediate, and 90-95% ostial left anterior descending)   . Tonsillectomy    . Coronary artery bypass graft  2002    x4 / Left internal mammary artery to the LAD.  / Saphenous vein graft to the right posterolateral.  /  Saphenous vein graft to  the ramus intermedius. / Saphenous vein graft to the circumflex marginal     . Amputation  03/06/2012    Procedure: AMPUTATION RAY;  Surgeon: Alta Corning, MD;  Location: [facility];  Service: Orthopedics;  Laterality: Left;  First Ray Amputation  . Intramedullary (im) nail intertrochanteric Right 08/17/2013    Procedure: INTRAMEDULLARY (IM) NAIL INTERTROCHANTRIC;  Surgeon: Marybelle Killings, MD;  Location: [facility];  Service: Orthopedics;  Laterality: Right;  . Endarterectomy femoral Left 03/24/2014    Procedure: Left Common Femoral  Artery Endarterectomy with Vein Patch Angioplasty,  Ultrasound ;  Surgeon: Chuck Hint, MD;  Location: [facility];  Service: Vascular;  Laterality: Left;  . Toe amputation     History reviewed. No pertinent family history.  Social History:  reports that he quit smoking about 29 years ago. His smoking use included Cigarettes. He has a 20 pack-year smoking history. He has never used smokeless tobacco. He reports that he does not drink alcohol or use illicit drugs.   Allergies  Allergen Reactions  . Morphine And Related Other (See Comments)    Agitated with morphine drip   Medications  Prior to Admission  Medication Sig Dispense Refill  . allopurinol (ZYLOPRIM) 300 MG tablet Take 0.5 tablets (150 mg total) by mouth daily.  90 tablet  3  . aspirin EC 81 MG tablet Take 81 mg by mouth 3 (three) times a week. Monday, Wednesday, Friday      . atorvastatin (LIPITOR) 10 MG tablet Take 1 tablet (10 mg total) by mouth every evening.  90 tablet  3  . B Complex-Biotin-FA (B COMPLETE PO) Take 1 tablet by mouth daily.       . carvedilol (COREG) 6.25 MG tablet Take 1 tablet (6.25 mg total) by mouth 2 (two) times daily with a meal.  180 tablet  3  . Coenzyme Q10 (CO Q-10 PO) Take 1 tablet by mouth every morning.       . DULoxetine (CYMBALTA) 60 MG capsule Take 60 mg by mouth every evening.      Marland Kitchen HYDROmorphone (DILAUDID) 4 MG tablet Take 8 mg by mouth as needed for severe pain.       Marland Kitchen lisinopril (PRINIVIL,ZESTRIL) 20 MG tablet Take 20 mg by mouth daily.      . Multiple Vitamin (MULTIVITAMIN) capsule Take 1 capsule by mouth every morning.       . nitroGLYCERIN (NITROSTAT) 0.4 MG SL tablet Place 0.4 mg under the tongue every 5 (five) minutes as needed for chest pain.      . polyethylene glycol (MIRALAX / GLYCOLAX) packet Take 17 g by mouth daily as needed.  14 each  0  . tamsulosin (FLOMAX) 0.4 MG CAPS capsule Take 0.4 mg by mouth at bedtime.      Marland Kitchen tobramycin (TOBREX) 0.3 % ophthalmic solution Place 1 drop into both eyes daily as needed (for Bacterial eye infection).       . vitamin C (ASCORBIC ACID) 500 MG tablet Take 500 mg by mouth every morning.        Home: Home Living Family/patient expects to be discharged to:: Private residence Living Arrangements: Spouse/significant other Available Help at Discharge: Family;Available 24 hours/day Type of Home: House Home Access: Stairs to enter [employer] of Steps: 1 threshold Entrance Stairs-Rails: None Home Layout: Two level;Able to live on main level with bedroom/bathroom Home Equipment: Dan Humphreys - 2 wheels;Bedside  commode;Shower seat;Shower seat - built in;Grab bars - tub/shower Additional Comments: pt reports he has had several falls, has been working on walking without AD and has fallen  Functional History: Prior Function Level of Independence: Independent with assistive device(s) Comments: uses RW; 3 falls in past few months (2 when not using RW) Functional Status:  Mobility: Bed Mobility Overal bed mobility: Needs Assistance Bed Mobility: Sit to Supine Supine to sit: HOB elevated;+2 for physical assistance;+2 for safety/equipment;Mod assist Sit  to supine: Mod assist;+2 for safety/equipment General bed mobility comments: assist for LEs onto bed Transfers Overall transfer level: Needs assistance Equipment used: Rolling walker (2 wheeled) Transfers: Sit to/from Stand Sit to Stand: Min assist;+2 safety/equipment General transfer comment: verbal cues for UE placement, better sit to stand from recliner (vs bed from this morning) Ambulation/Gait Ambulation/Gait assistance: Min assist;+2 safety/equipment Ambulation Distance (Feet): 20 Feet Assistive device: Rolling walker (2 wheeled) Gait Pattern/deviations: Step-to pattern;Trunk flexed;Ataxic Gait velocity: decr General Gait Details: pt able to perform more hip and knee flexion this afternoon however still remains in increased extension for gait pattern    ADL:    Cognition: Cognition Overall Cognitive Status: Within Functional Limits for tasks assessed Cognition Arousal/Alertness: Awake/alert Behavior During Therapy: WFL for tasks assessed/performed Overall Cognitive Status: Within Functional Limits for tasks assessed  Blood pressure 137/64, pulse 68, temperature 98 F (36.7 C), temperature source Oral, resp. rate 16, height $RemoveBe'5\' 10"'yNTMymGYe$  (1.778 m), weight 82.6 kg (182 lb 1.6 oz), SpO2 100.00%. Physical Exam  Results for orders placed during the hospital encounter of 07/06/14 (from the past 24 hour(s))  RENAL FUNCTION PANEL     Status:  Abnormal   Collection Time    07/10/14  4:28 AM      Result Value Ref Range   Sodium 129 (*) 137 - 147 mEq/L   Potassium 4.5  3.7 - 5.3 mEq/L   Chloride 94 (*) 96 - 112 mEq/L   CO2 24  19 - 32 mEq/L   Glucose, Bld 116 (*) 70 - 99 mg/dL   BUN 23  6 - 23 mg/dL   Creatinine, Ser 1.03  0.50 - 1.35 mg/dL   Calcium 9.2  8.4 - 10.5 mg/dL   Phosphorus 4.2  2.3 - 4.6 mg/dL   Albumin 3.2 (*) 3.5 - 5.2 g/dL   GFR calc non Af Amer 65 (*) >90 mL/min   GFR calc Af Amer 75 (*) >90 mL/min   Anion gap 11  5 - 15   No results found.  Assessment/Plan: Diagnosis: left hip contusion/ gait disorder 1. Does the need for close, 24 hr/day medical supervision in concert with the patient's rehab needs make it unreasonable for this patient to be served in a less intensive setting? No 2. Co-Morbidities requiring supervision/potential complications:   3. Due to bowel management, safety and disease management, does the patient require 24 hr/day rehab nursing? No 4. Does the patient require coordinated care of a physician, rehab nurse, PT, OT to address physical and functional deficits in the context of the above medical diagnosis(es)? No Addressing deficits in the following areas: endurance, locomotion, bowel/bladder control, bathing and grooming 5. Can the patient actively participate in an intensive therapy program of at least 3 hrs of therapy per day at least 5 days per week? No 6. The potential for patient to make measurable gains while on inpatient rehab is fair 7. Anticipated functional outcomes upon discharge from inpatient rehab are n/a  with PT, n/a with OT, n/a with SLP. 8. Estimated rehab length of stay to reach the above functional goals is:   9. Does the patient have adequate social supports to accommodate these discharge functional goals? No 10. Anticipated D/C setting: Other 11. Anticipated post D/C treatments: N/A 12. Overall Rehab/Functional Prognosis: fair  RECOMMENDATIONS: This patient's  condition is appropriate for continued rehabilitative care in the following setting: SNF Patient has agreed to participate in recommended program. Potentially Note that insurance prior authorization may be required for reimbursement for  recommended care.  Comment: Pt lacks medical necessity for CIR. PT is recommending SNF, and family prefers that he is closer to home.   Meredith Staggers, MD, [facility]     07/10/2014

## 2014-07-10 NOTE — Progress Notes (Signed)
Patient is set to discharge to [facility] today. Patient & wife at bedside aware. Discharge packet in [place] aware. Patient's wife, Timothy [name] to transport to SNF.   Clinical Social Work Department CLINICAL SOCIAL WORK PLACEMENT NOTE 07/10/2014  Patient:  Timothy [name], Timothy [name]  Account Number:  [id number] Admit date:  07/06/2014  Clinical Social Worker:  Renold Genta  Date/time:  07/08/2014 02:47 PM  Clinical Social Work is seeking post-discharge placement for this patient at the following level of care:   SKILLED NURSING   (*CSW will update this form in Epic as items are completed)   07/08/2014  Patient/family provided with [place] Department of Clinical Social Work's list of facilities offering this level of care within the geographic area requested by the patient (or if unable, by the patient's family).  07/08/2014  Patient/family informed of their freedom to choose among providers that offer the needed level of care, that participate in Medicare, Medicaid or managed care program needed by the patient, have an available bed and are willing to accept the patient.  07/08/2014  Patient/family informed of [facility]' ownership interest in [facility], as well as of the fact that they are under no obligation to receive care at this facility.  PASARR submitted to EDS on 07/08/2014 PASARR number received on 07/08/2014  FL2 transmitted to all facilities in geographic area requested by pt/family on  07/08/2014 FL2 transmitted to all facilities within larger geographic area on   Patient informed that his/her managed care company has contracts with or will negotiate with  certain facilities, including the following:     Patient/family informed of bed offers received:  07/08/2014 Patient chooses bed at [place], [facility] Physician recommends and patient chooses bed at    Patient to be transferred to [place], [facility] on   07/10/2014 Patient to be transferred to facility by wife's car Patient and family notified of transfer on 07/10/2014 Name of family member notified:  patient's wife, Timothy [name] at bedside  The following physician request were entered in Epic:   Additional Comments:   [name], [place] Social Worker cell #: [phone]

## 2014-07-10 NOTE — Discharge Summary (Signed)
Physician Discharge Summary  Timothy [name] SVX:[id number] DOB: [birth year] DOA: 07/06/2014  PCP: [name], MD  Admit date: 07/06/2014 Discharge date: 07/10/2014  Time spent: 40 minutes  Recommendations for Outpatient Follow-up:  1. Followup with primary care physician within one week. 2. Followup with [name] on 07/24/2014 and 11:15 AM for hyponatremia. 3. Check BMP within one week, follow on the sodium trend.  Discharge Diagnoses:  Principal Problem:   Hyponatremia Active Problems:   Ischemic cardiomyopathy   Multiple myeloma   Hypertension   Anemia   Systolic CHF, chronic   CAD (coronary artery disease)   Fall   Acute right hip pain   Frequent falls   Discharge Condition: Stable  Diet recommendation: Regular, 1.5 L of fluid/day restriction and NO salt restriction.  Filed Weights   07/08/14 0412 07/09/14 0510 07/10/14 0552  Weight: 82.101 kg (181 lb) 82.1 kg (181 lb) 82.6 kg (182 lb 1.6 oz)    History of present illness:  80 year old male with history of multiple myeloma since 2007 currently underwent no therapy and follow up with Dr. [name] , ischemic cardiomyopathy with EF of 35-40%, CAD status post CABG, history of gout, hypertension, peripheral neuropathy, history of peripheral vascular disease with a nonhealing left foot ulcer status post left femoral endarterectomy with angioplasty in May 2015 presented to the ED with a fall this morning. Patient that he is been using a walker for past several months but for the past 3 months he has been unsteady during ambulation and has had about the falls at home during this time. He reports that while using his walker today he had a fall at home and land on his right hip. He had difficulty getting up and walking after that. He denies any loss of consciousness or feeling dizzy or having a syncopal event. She denies any claudication, tingling or numbness to the extremity. He sustained right hip fracture requiring IM nailing  in October 2014 following which she was able to ambulate with the help of a walker.  Patient has also been found to be hyponatremic , worsened for past 3 months . He was advised to be on fluid restriction by his cardiologist however was in a dilemma as he was supposed to hydrate himself to perfuse his kidneys well due to his multiple myeloma. During his recent visit with the cardiologist he had a head CT done for the hyponatremia which was unremarkable and an outpatient visit with nephrologist [name] was scheduled for 8/21.  Patient denies headache, dizziness, fever, chills, nausea , vomiting, chest pain, palpitations, SOB, abdominal pain, bowel or urinary symptoms. Denies change in weight or appetite  Hospital Course:   Hyponatremia:  -Suspect to be secondary to mild SIADH vs effect from medications (cymbalta) vs hypothyroidism.  -for now continue fluid restriction  -cymbalta has been discontinued  -Nephrology recommended 1.5 L fluid restriction as outpatient.  -Followup with nephrology as outpatient on 9//15.   Frequent falls, weakness and deconditioning: will ask Pt to evaluate  - Is likely secondary to hyponatremia. PT recommended to SNF.   ?? Lung nodule on CXR:  -Followup CT scan of the chest on 07/06/14 with showing no evidence of pulmonary nodules. Likely x-ray finding is false positive.   Ischemic cardiomyopathy: euvolemic. No CP or SOB.  -continue current medication regimen (ASA, coreg and lisinopril)  -continue follow up with cardiology   MM: Appreciate Dr. [name] help   Chronic low back pain and body aches from falling episode: continue PRN  analgesics  -PT has been asked to evaluate and provide rec's  -There is a big bruise upon the right hip, x-ray from the 8/17 showed no evidence of acute events.   HTN: fair well controlled. Continue current meds   Hypothyroidism: elevated TSH.  -will start synthroid at 50 mcg daily  -Free T4 within normal limits, started on  Synthroid 50 mcg, check TSH and 4 weeks.  BPH and acute urinary retention: will continue flomax and follow closely bladder scan.  -Had 1 episode requiring in and out cath (bladder scan at that moment with almost 400 cc)  -If further retention present will place foley and will need urology inputs.   Anemia of chronic disease: stable and at baseline.  -Monitor Hgb trend  -No overt bleeding appreciated   Procedures:  None  Consultations:  Oncology.  Nephrology  Discharge Exam: Filed Vitals:   07/10/14 0552  BP: 137/64  Pulse: 68  Temp: 98 F (36.7 C)  Resp: 16   General: Alert and awake, oriented x3, not in any acute distress. HEENT: anicteric sclera, pupils reactive to light and accommodation, EOMI CVS: S1-S2 clear, no murmur rubs or gallops Chest: clear to auscultation bilaterally, no wheezing, rales or rhonchi Abdomen: soft nontender, nondistended, normal bowel sounds, no organomegaly Extremities: no cyanosis, clubbing or edema noted bilaterally Neuro: Cranial nerves II-XII intact, no focal neurological deficits.  Discharge Instructions You were cared for by a hospitalist during your hospital stay. If you have any questions about your discharge medications or the care you received while you were in the hospital after you are discharged, you can call the unit and asked to speak with the hospitalist on call if the hospitalist that took care of you is not available. Once you are discharged, your primary care physician will handle any further medical issues. Please note that NO REFILLS for any discharge medications will be authorized once you are discharged, as it is imperative that you return to your primary care physician (or establish a relationship with a primary care physician if you do not have one) for your aftercare needs so that they can reassess your need for medications and monitor your lab values.     Medication List         allopurinol 300 MG tablet  Commonly  known as:  ZYLOPRIM  Take 0.5 tablets (150 mg total) by mouth daily.     aspirin EC 81 MG tablet  Take 81 mg by mouth 3 (three) times a week. Monday, Wednesday, Friday     atorvastatin 10 MG tablet  Commonly known as:  LIPITOR  Take 1 tablet (10 mg total) by mouth every evening.     B COMPLETE PO  Take 1 tablet by mouth daily.     carvedilol 6.25 MG tablet  Commonly known as:  COREG  Take 1 tablet (6.25 mg total) by mouth 2 (two) times daily with a meal.     CO Q-10 PO  Take 1 tablet by mouth every morning.     DULoxetine 60 MG capsule  Commonly known as:  CYMBALTA  Take 60 mg by mouth every evening.     HYDROmorphone 4 MG tablet  Commonly known as:  DILAUDID  Take 2 tablets (8 mg total) by mouth as needed for severe pain.     levothyroxine 50 MCG tablet  Commonly known as:  SYNTHROID, LEVOTHROID  Take 1 tablet (50 mcg total) by mouth daily before breakfast.     lisinopril 20 MG  tablet  Commonly known as:  PRINIVIL,ZESTRIL  Take 20 mg by mouth daily.     multivitamin capsule  Take 1 capsule by mouth every morning.     nitroGLYCERIN 0.4 MG SL tablet  Commonly known as:  NITROSTAT  Place 0.4 mg under the tongue every 5 (five) minutes as needed for chest pain.     polyethylene glycol packet  Commonly known as:  MIRALAX / GLYCOLAX  Take 17 g by mouth daily as needed.     tamsulosin 0.4 MG Caps capsule  Commonly known as:  FLOMAX  Take 0.4 mg by mouth at bedtime.     tobramycin 0.3 % ophthalmic solution  Commonly known as:  TOBREX  Place 1 drop into both eyes daily as needed (for Bacterial eye infection).     vitamin C 500 MG tablet  Commonly known as:  ASCORBIC ACID  Take 500 mg by mouth every morning.       Allergies  Allergen Reactions  . Morphine And Related Other (See Comments)    Agitated with morphine drip       Follow-up Information   Follow up with DUNHAM,CYNTHIA B, MD On 07/24/2014. (at 11:15am)    Specialty:  Nephrology   Contact information:    [address] [place] [address] [phone]        The results of significant diagnostics from this hospitalization (including imaging, microbiology, ancillary and laboratory) are listed below for reference.    Significant Diagnostic Studies: Dg Chest 2 View  07/06/2014   CLINICAL DATA:  Chest pain; recent trauma ; history of multiple myeloma  EXAM: CHEST  2 VIEW  COMPARISON:  February 04, 2014  FINDINGS: There is a nondisplaced fracture of the anterolateral right seventh rib. No pneumothorax or effusion.  There is a questionable nodular opacity in the left lower lobe measuring 1.5 x 1.5 cm. There is mild scarring in the left base. Elsewhere lungs are clear.  Heart is mildly enlarged with pulmonary vascularity within normal limits. Patient is status post coronary artery bypass grafting. No adenopathy.  IMPRESSION: 1.5 x 1.5 cm questionable nodular opacity left lower lobe seen only on frontal view. Advise noncontrast enhanced chest CT to further assess.  Nondisplaced fracture anterolateral right seventh rib. No pneumothorax.  No edema or consolidation.  Mild scarring left base.   Electronically Signed   By: Lowella Grip M.D.   On: 07/06/2014 09:45   Dg Hip Complete Right  07/06/2014   CLINICAL DATA:  Pain post trauma ; history of multiple myeloma  EXAM: RIGHT HIP - COMPLETE 2+ VIEW  COMPARISON:  August 17, 2013  FINDINGS: Frontal pelvis as well as frontal and lateral right hip images were obtained. There is postoperative change in the proximal right femur. No acute fracture or dislocation is appreciable. There is moderate symmetric narrowing of both hip joints. There is no apparent blastic or lytic bone lesion. There is myositis ossificans in the area of previous trauma on the right. There is lower lumbar levoscoliosis. There are multiple foci of arterial vascular calcification.  IMPRESSION: Postoperative change proximal right femur. There is myositis ossificans in this area consistent with  old trauma. No acute fracture or dislocation. Moderate symmetric narrowing of both hip joints.   Electronically Signed   By: Lowella Grip M.D.   On: 07/06/2014 09:41   Ct Head Wo Contrast  06/30/2014   CLINICAL DATA:  Falls.  Unsteady gait.  No history of injury.  EXAM: CT HEAD WITHOUT CONTRAST  TECHNIQUE: Contiguous axial images were obtained from the base of the skull through the vertex without intravenous contrast.  COMPARISON:  None.  FINDINGS: Ventricles and sulci are prominent. No evidence for acute cortically based infarct, intracranial mass, mass effect or acute hemorrhage. Periventricular and subcortical white matter hypodensity compatible with chronic small vessel ischemic change. Mastoid air cells are well aerated. Small mucous retention cyst within the frontal sinus. Calvarium is intact.  IMPRESSION: No acute intracranial process.  Chronic small vessel ischemic change.   Electronically Signed   By: Lovey Newcomer M.D.   On: 06/30/2014 12:42   Ct Chest Wo Contrast  07/06/2014   CLINICAL DATA:  Possible lung nodule.  EXAM: CT CHEST WITHOUT CONTRAST  TECHNIQUE: Multidetector CT imaging of the chest was performed following the standard protocol without IV contrast.  COMPARISON:  Chest x-ray 07/06/2014.  Remote chest CT from 2008.  FINDINGS: Chest wall:  No chest wall mass, supraclavicular or axillary lymphadenopathy. The thyroid gland appears normal. The bony thorax is intact. No destructive bone lesions or spinal canal compromise. There are remote healed rib fractures bilaterally. Surgical changes from bypass surgery are noted.  Mediastinum:  The heart is mildly enlarged. No pericardial effusion. No mediastinal or hilar mass or adenopathy. Small scattered lymph nodes are noted. There is moderate atherosclerotic change involving the aorta and branch vessels including the coronary arteries. The esophagus is grossly normal.  Lungs:  Emphysematous changes are noted and slightly progressive when compared  to prior CT scan. There is lower lobe predominant peribronchial thickening and increased interstitial markings. No focal airspace consolidation to suggest pneumonia. No worrisome pulmonary lesions. No pleural effusion.  Upper abdomen:  No significant findings.  IMPRESSION: 1. Emphysematous changes and bronchitic changes but no focal infiltrate or worrisome pulmonary lesion. 2. Moderate atherosclerotic calcifications involving the aorta and branch vessels. 3. Remote rib fractures bilaterally   Electronically Signed   By: Kalman Jewels M.D.   On: 07/06/2014 21:47    Microbiology: No results found for this or any previous visit (from the past 240 hour(s)).   Labs: Basic Metabolic Panel:  Recent Labs Lab 07/06/14 0908  07/07/14 0455 07/07/14 1558 07/08/14 0442 07/09/14 0431 07/10/14 0428  NA 120*  < > 122* 120* 124* 129* 129*  K 4.8  --  4.6  --  4.6 4.5 4.5  CL 85*  --  87*  --  89* 94* 94*  CO2 23  --  23  --  _0 GLUCOSE 127*  --  117*  --  122* 121* 116*  BUN 15  --  18  --  23 27* 23  CREATININE 0.92  --  1.17  --  1.27 1.12 1.03  CALCIUM 9.2  --  9.2  --  9.1 9.1 9.2  PHOS  --   --   --   --  4.6 4.3 4.2  < > = values in this interval not displayed. Liver Function Tests:  Recent Labs Lab 07/03/14 1053 07/08/14 0442 07/09/14 0431 07/10/14 0428  AST 22  --   --   --   ALT 12  --   --   --   ALKPHOS 56  --   --   --   BILITOT 0.53  --   --   --   PROT 7.0  --   --   --   ALBUMIN 3.6 3.3* 3.1* 3.2*   No results found for this basename: LIPASE, AMYLASE,  in  the last 168 hours No results found for this basename: AMMONIA,  in the last 168 hours CBC:  Recent Labs Lab 07/03/14 1052 07/06/14 0908  WBC 6.1 8.2  NEUTROABS 3.5 5.6  HGB 10.1* 10.2*  HCT 30.4* 29.5*  MCV 85.4 82.6  PLT 198 174   Cardiac Enzymes: No results found for this basename: CKTOTAL, CKMB, CKMBINDEX, TROPONINI,  in the last 168 hours BNP: BNP (last 3 results)  Recent Labs   06/22/14 1146  PROBNP 88.0   CBG: No results found for this basename: GLUCAP,  in the last 168 hours     Signed:  Merrik Puebla A  [facility] 07/10/2014, 10:23 AM

## 2014-07-11 ENCOUNTER — Other Ambulatory Visit: Payer: Self-pay | Admitting: Oncology

## 2014-07-15 ENCOUNTER — Other Ambulatory Visit: Payer: Medicare Other

## 2014-07-15 ENCOUNTER — Encounter: Payer: Self-pay | Admitting: Oncology

## 2014-07-15 ENCOUNTER — Telehealth: Payer: Self-pay | Admitting: Oncology

## 2014-07-15 NOTE — Telephone Encounter (Signed)
, °

## 2014-07-16 ENCOUNTER — Telehealth: Payer: Self-pay | Admitting: Oncology

## 2014-07-16 NOTE — Telephone Encounter (Signed)
Spk w/pt concerning r/s 08/28 apt due to NP/KC daughter is sick, pt is in rehab in [place] and won't be able to make it, pt is hoping he will be done before his next visit 09/11. Mailing out sch to pt per pt...Marland KitchenMarland KitchenKJ

## 2014-07-17 ENCOUNTER — Ambulatory Visit: Payer: Medicare Other

## 2014-07-17 ENCOUNTER — Ambulatory Visit: Payer: Medicare Other | Admitting: Oncology

## 2014-07-17 ENCOUNTER — Other Ambulatory Visit: Payer: Medicare Other

## 2014-07-31 ENCOUNTER — Ambulatory Visit (HOSPITAL_BASED_OUTPATIENT_CLINIC_OR_DEPARTMENT_OTHER): Payer: Medicare Other

## 2014-07-31 ENCOUNTER — Ambulatory Visit (HOSPITAL_BASED_OUTPATIENT_CLINIC_OR_DEPARTMENT_OTHER): Payer: Medicare Other | Admitting: Oncology

## 2014-07-31 ENCOUNTER — Telehealth: Payer: Self-pay | Admitting: Oncology

## 2014-07-31 ENCOUNTER — Other Ambulatory Visit (HOSPITAL_BASED_OUTPATIENT_CLINIC_OR_DEPARTMENT_OTHER): Payer: Medicare Other

## 2014-07-31 VITALS — BP 134/41 | HR 64 | Temp 97.7°F | Resp 18 | Ht 70.0 in | Wt 193.1 lb

## 2014-07-31 VITALS — BP 138/67 | HR 51 | Temp 97.3°F

## 2014-07-31 DIAGNOSIS — N289 Disorder of kidney and ureter, unspecified: Secondary | ICD-10-CM

## 2014-07-31 DIAGNOSIS — C9000 Multiple myeloma not having achieved remission: Secondary | ICD-10-CM

## 2014-07-31 DIAGNOSIS — M7989 Other specified soft tissue disorders: Secondary | ICD-10-CM

## 2014-07-31 DIAGNOSIS — I739 Peripheral vascular disease, unspecified: Secondary | ICD-10-CM

## 2014-07-31 DIAGNOSIS — D638 Anemia in other chronic diseases classified elsewhere: Secondary | ICD-10-CM

## 2014-07-31 DIAGNOSIS — D631 Anemia in chronic kidney disease: Secondary | ICD-10-CM

## 2014-07-31 DIAGNOSIS — I70209 Unspecified atherosclerosis of native arteries of extremities, unspecified extremity: Secondary | ICD-10-CM

## 2014-07-31 DIAGNOSIS — N19 Unspecified kidney failure: Secondary | ICD-10-CM

## 2014-07-31 DIAGNOSIS — D649 Anemia, unspecified: Secondary | ICD-10-CM

## 2014-07-31 DIAGNOSIS — Z23 Encounter for immunization: Secondary | ICD-10-CM

## 2014-07-31 DIAGNOSIS — N039 Chronic nephritic syndrome with unspecified morphologic changes: Principal | ICD-10-CM

## 2014-07-31 DIAGNOSIS — L98499 Non-pressure chronic ulcer of skin of other sites with unspecified severity: Secondary | ICD-10-CM

## 2014-07-31 LAB — CBC & DIFF AND RETIC
BASO%: 0.5 % (ref 0.0–2.0)
BASOS ABS: 0 10*3/uL (ref 0.0–0.1)
EOS%: 2.9 % (ref 0.0–7.0)
Eosinophils Absolute: 0.2 10*3/uL (ref 0.0–0.5)
HCT: 27.3 % — ABNORMAL LOW (ref 38.4–49.9)
HEMOGLOBIN: 8.6 g/dL — AB (ref 13.0–17.1)
Immature Retic Fract: 11.4 % — ABNORMAL HIGH (ref 3.00–10.60)
LYMPH%: 24.9 % (ref 14.0–49.0)
MCH: 27.9 pg (ref 27.2–33.4)
MCHC: 31.5 g/dL — AB (ref 32.0–36.0)
MCV: 88.6 fL (ref 79.3–98.0)
MONO#: 0.7 10*3/uL (ref 0.1–0.9)
MONO%: 11.8 % (ref 0.0–14.0)
NEUT%: 59.9 % (ref 39.0–75.0)
NEUTROS ABS: 3.3 10*3/uL (ref 1.5–6.5)
Platelets: 200 10*3/uL (ref 140–400)
RBC: 3.08 10*6/uL — ABNORMAL LOW (ref 4.20–5.82)
RDW: 18.5 % — ABNORMAL HIGH (ref 11.0–14.6)
Retic %: 3 % — ABNORMAL HIGH (ref 0.80–1.80)
Retic Ct Abs: 92.4 10*3/uL (ref 34.80–93.90)
WBC: 5.6 10*3/uL (ref 4.0–10.3)
lymph#: 1.4 10*3/uL (ref 0.9–3.3)
nRBC: 0 % (ref 0–0)

## 2014-07-31 LAB — COMPREHENSIVE METABOLIC PANEL (CC13)
ALT: 8 U/L (ref 0–55)
AST: 20 U/L (ref 5–34)
Albumin: 3.4 g/dL — ABNORMAL LOW (ref 3.5–5.0)
Alkaline Phosphatase: 74 U/L (ref 40–150)
Anion Gap: 7 mEq/L (ref 3–11)
BILIRUBIN TOTAL: 0.47 mg/dL (ref 0.20–1.20)
BUN: 18.2 mg/dL (ref 7.0–26.0)
CALCIUM: 9.2 mg/dL (ref 8.4–10.4)
CHLORIDE: 104 meq/L (ref 98–109)
CO2: 25 mEq/L (ref 22–29)
Creatinine: 1.1 mg/dL (ref 0.7–1.3)
Glucose: 94 mg/dl (ref 70–140)
Potassium: 4.5 mEq/L (ref 3.5–5.1)
Sodium: 137 mEq/L (ref 136–145)
Total Protein: 6.7 g/dL (ref 6.4–8.3)

## 2014-07-31 LAB — PROTEIN / CREATININE RATIO, URINE
Creatinine, Urine: 36.1 mg/dL
Protein Creatinine Ratio: 0.42 — ABNORMAL HIGH (ref <0.15)
Total Protein, Urine: 15 mg/dL (ref 5–25)

## 2014-07-31 MED ORDER — DARBEPOETIN ALFA-POLYSORBATE 200 MCG/0.4ML IJ SOLN
200.0000 ug | Freq: Once | INTRAMUSCULAR | Status: AC
  Administered 2014-07-31: 200 ug via SUBCUTANEOUS
  Filled 2014-07-31: qty 0.4

## 2014-07-31 MED ORDER — INFLUENZA VAC SPLIT QUAD 0.5 ML IM SUSY
0.5000 mL | PREFILLED_SYRINGE | Freq: Once | INTRAMUSCULAR | Status: AC
  Administered 2014-07-31: 0.5 mL via INTRAMUSCULAR
  Filled 2014-07-31: qty 0.5

## 2014-07-31 NOTE — Addendum Note (Signed)
Addended by: Laureen Abrahams on: 07/31/2014 06:22 PM   Modules accepted: Medications

## 2014-07-31 NOTE — Progress Notes (Signed)
ID: [name]   DOB: 04/28/1928  MR#: [id number]  WUJ#:[id number]  PCP:  [facility]: OTHER MD: Dorna Leitz, Alysia Penna, Kyra Leyland  CHIEF COMPLAINT: multiple myeloma CURRENT THERAPY: under observation  MYELOMA HISTORY: Patient presented in April 2007 with symptomatic slowly progressive anemia, with normal iron parameters, folic acid, and vitamin B12. Plans were made to obtain bone marrow aspirate and biopsy as an outpatient, however the patient was hospitalized shortly thereafter with a creatinine of 8.6. Renal biopsy demonstrated the presence of  light chain deposition disease. Bone marrow biopsy demonstrated 37% plasma cells. A skeletal survey showed small calvarial lytic lesions, mild osteopenia, and cervical spondylosis. With a well-established diagnosis of light-chain multiple myeloma Dr. Melodie Bouillon subsequent treatments are as detailed below.   INTERVAL HISTORY: Timothy [name] returns today with his wife, [name], for followup of his multiple myeloma. Since his last visit here he had a fall (07/05/2014) resulting in a large hematoma in his right hip. He was admitted, and discharged to a rehabilitation facility[facility], where he was rehabbed for 20 days. He just went home yesterday. As a result of all this he missed his Aranesp dose 2 weeks ago.  REVIEW OF SYSTEMS: Timothy [name] is pretty much wheelchair bound at present. He does try to walk a little bit with a walker each day. He knows the exercises he needs to do an of course his wife [name] helps him out. He tells me his appetite is good, has no nausea or vomiting problems, and he is on a low salt diet which she seems to be tolerating well. He has no cough, occasionally has a little bit of phlegm which he clears in the morning, no shortness of breath or pleurisy. He denies any problems with bowel or bladder habits. He is very concerned because his lower legs or swelling. He wants  to do "something" about that but does not want to use the compression stockings he already has. There've been no fevers, no further bleeding problems, and no further falls. A detailed review of systems today was otherwise stable.  PAST MEDICAL HISTORY: Past Medical History  Diagnosis Date  . ASCVD (arteriosclerotic cardiovascular disease)   . Anemia     chronic mild anemia  . Gout   . Hypertension   . Lumbar disc disease     post laminectomy  . Chronic back pain   . History of echocardiogram 11/02/2010    EF range of 30 to 35% / There is hypokinsesis of the basal-mild inferolateral myocardium  . CHF (congestive heart failure)   . Heart murmur   . Ischemic cardiomyopathy   . Renal insufficiency   . Peripheral neuropathy   . Inferior myocardial infarction 1986  . Hyperlipidemia   . SOB (shortness of breath)   . Fatigue   . Peripheral neuropathy   . Coronary artery disease     CABG 2002 by Dr. Roxy Manns with LIMA to LAD, SVG to intermediate, SVG to LCX, & SVG to PL. Last nuclear in 2012 showing large inferior scar with EF of 39%.   . Blood transfusion     DEC 2012 - TWO UNITS  . Myeloma     Multiple myeloma, with recurrence of increasing problems  DR. Murchison -ONCOLOGIST.  PT HAS BEEN OFF CHEMO SINCE HIS HOSPITALIZATION FEB 2013 FOR LEFT FOOT INFECTION  . Cancer   . History of shingles MARCH 2009    SHINGLE LESIONS WERE AROUND RIGHT EYE--PT HAS RESIDUAL  ITCHING AROUND THE EYE.  . Osteomyelitis     s/p toe amputation  . Chronic systolic heart failure     EF of 30% per echo 02/2012  . Protein malnutrition   . Critical lower limb ischemia    PAST SURGICAL HISTORY: Past Surgical History  Procedure Laterality Date  . Laminectomy    . Inguinal hernia repair    . Cardiac catheterization  06/20/2000    Severe coronary disease (totally occluded right artery, 90% left circumflex, 50-60% intermediate, and 90-95% ostial left anterior descending)   . Tonsillectomy    . Coronary artery  bypass graft  2002    x4 / Left internal mammary artery to the LAD.  / Saphenous vein graft to the right posterolateral.  /  Saphenous vein graft to  the ramus intermedius. / Saphenous vein graft to the circumflex marginal     . Amputation  03/06/2012    Procedure: AMPUTATION RAY;  Surgeon: Alta Corning, MD;  Location: [facility];  Service: Orthopedics;  Laterality: Left;  First Ray Amputation  . Intramedullary (im) nail intertrochanteric Right 08/17/2013    Procedure: INTRAMEDULLARY (IM) NAIL INTERTROCHANTRIC;  Surgeon: Marybelle Killings, MD;  Location: [facility];  Service: Orthopedics;  Laterality: Right;  . Endarterectomy femoral Left 03/24/2014    Procedure: Left Common Femoral  Artery Endarterectomy with Vein Patch Angioplasty,  Ultrasound ;  Surgeon: Angelia Mould, MD;  Location: [facility];  Service: Vascular;  Laterality: Left;  . 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 He and his wife [name] are the only ones at home.    ADVANCED DIRECTIVES:  HEALTH MAINTENANCE: History  Substance Use Topics  . Smoking status: Former Smoker -- 1.00 packs/day for 20 years    Types: Cigarettes    Quit date: 11/20/1984  . Smokeless tobacco: Never Used  . Alcohol Use: No     Colonoscopy:  Bone density:  Lipid panel:  Allergies  Allergen Reactions  . Morphine And Related Other (See Comments)    Agitated with morphine drip    Current Outpatient Prescriptions  Medication Sig Dispense Refill  . allopurinol (ZYLOPRIM) 300 MG tablet Take 0.5 tablets (150 mg total) by mouth daily.  90 tablet  3  . aspirin EC 81 MG tablet Take 81 mg by mouth 3 (three) times a week. Monday, Wednesday, Friday      . atorvastatin (LIPITOR) 10 MG tablet Take 1 tablet (10 mg total) by mouth every evening.  90 tablet  3  . B Complex-Biotin-FA (B  COMPLETE PO) Take 1 tablet by mouth daily.       . carvedilol (COREG) 6.25 MG tablet Take 1 tablet (6.25 mg total) by mouth 2 (two) times daily with a meal.  180 tablet  3  . Coenzyme Q10 (CO Q-10 PO) Take 1 tablet by mouth every morning.       . DULoxetine (CYMBALTA) 60 MG capsule Take 60 mg by mouth every evening.      Marland Kitchen HYDROmorphone (DILAUDID) 4 MG tablet Take 2 tablets (8 mg total) by mouth as needed for severe pain.  10 tablet  0  . levothyroxine (SYNTHROID, LEVOTHROID) 50 MCG tablet Take 1 tablet (50 mcg total) by mouth daily before breakfast.  30 tablet  0  . lisinopril (PRINIVIL,ZESTRIL) 20 MG tablet Take 20 mg by  mouth daily.      . Multiple Vitamin (MULTIVITAMIN) capsule Take 1 capsule by mouth every morning.       . nitroGLYCERIN (NITROSTAT) 0.4 MG SL tablet Place 0.4 mg under the tongue every 5 (five) minutes as needed for chest pain.      . polyethylene glycol (MIRALAX / GLYCOLAX) packet Take 17 g by mouth daily as needed.  14 each  0  . tamsulosin (FLOMAX) 0.4 MG CAPS capsule Take 0.4 mg by mouth at bedtime.      Marland Kitchen tobramycin (TOBREX) 0.3 % ophthalmic solution Place 1 drop into both eyes daily as needed (for Bacterial eye infection).       . vitamin C (ASCORBIC ACID) 500 MG tablet Take 500 mg by mouth every morning.       No current facility-administered medications for this visit.    Objective: Older white male examined in a wheelchair Filed Vitals:   07/31/14 1033  BP: 134/41  Pulse: 64  Temp: 97.7 F (36.5 C)  Resp: 18        Body mass index is 27.71 kg/(m^2).    ECOG FS: 2 Filed Weights   07/31/14 1033  Weight: 193 lb 1.6 oz (87.59 kg)   Sclerae unicteric, EOMs intact Oropharynx clear, teeth in good repair No cervical or supraclavicular adenopathy Lungs no rales or rhonchi Heart regular rate and rhythm, no murmur appreciated Abdomen soft, nontender, positive bowel sounds MSK no focal spinal tenderness, bilateral lower extremity edema Neuro: nonfocal, well  oriented, despondent affect   LAB RESULTS:  Results for JAHAAN, VANWAGNER (MRN [id number]) as of 07/31/2014 10:31  Ref. Range 03/11/2014 10:58 04/10/2014 11:09 05/08/2014 14:14 06/05/2014 13:39 07/03/2014 10:53  Kappa free light chain Latest Range: 0.33-1.94 mg/dL 71.00 (H) 49.10 (H) 59.40 (H) 59.50 (H) 61.00 (H)  Lambda Free Lght Chn Latest Range: 0.57-2.63 mg/dL 2.88 (H) 2.71 (H) 2.91 (H) 2.43 2.34  Kappa:Lambda Ratio Latest Range: 0.26-1.65  24.65 (H) 18.12 (H) 20.41 (H) 24.49 (H) 26.07 (H)     Lab Results  Component Value Date   WBC 5.6 07/31/2014   NEUTROABS 3.3 07/31/2014   HGB 8.6* 07/31/2014   HCT 27.3* 07/31/2014   MCV 88.6 07/31/2014   PLT 200 07/31/2014      Chemistry      Component Value Date/Time   NA 137 07/31/2014 1016   NA 129* 07/10/2014 0428   K 4.5 07/31/2014 1016   K 4.5 07/10/2014 0428   CL 94* 07/10/2014 0428   CL 98 05/12/2013 1048   CO2 25 07/31/2014 1016   CO2 24 07/10/2014 0428   BUN 18.2 07/31/2014 1016   BUN 23 07/10/2014 0428   CREATININE 1.1 07/31/2014 1016   CREATININE 1.03 07/10/2014 0428      Component Value Date/Time   CALCIUM 9.2 07/31/2014 1016   CALCIUM 9.2 07/10/2014 0428   ALKPHOS 74 07/31/2014 1016   ALKPHOS 42 03/25/2014 2115   AST 20 07/31/2014 1016   AST 25 03/25/2014 2115   ALT 8 07/31/2014 1016   ALT 10 03/25/2014 2115   BILITOT 0.47 07/31/2014 1016   BILITOT 0.7 03/25/2014 2115     Results for NIEVES, BARBERI (MRN [id number]) as of 07/31/2014 10:31  Ref. Range 05/21/2014 13:49 06/05/2014 13:34 06/19/2014 13:28 07/03/2014 10:52 07/31/2014 10:16  Retic Ct Abs Latest Range: 34.80-93.90 10e3/uL 112.49 (H) 75.70 99.02 (H) 86.86 92.40   STUDIES: Dg Chest 2 View  07/06/2014   CLINICAL DATA:  Chest pain; recent trauma ;  history of multiple myeloma  EXAM: CHEST  2 VIEW  COMPARISON:  February 04, 2014  FINDINGS: There is a nondisplaced fracture of the anterolateral right seventh rib. No pneumothorax or effusion.  There is a questionable nodular opacity in the left lower  lobe measuring 1.5 x 1.5 cm. There is mild scarring in the left base. Elsewhere lungs are clear.  Heart is mildly enlarged with pulmonary vascularity within normal limits. Patient is status post coronary artery bypass grafting. No adenopathy.  IMPRESSION: 1.5 x 1.5 cm questionable nodular opacity left lower lobe seen only on frontal view. Advise noncontrast enhanced chest CT to further assess.  Nondisplaced fracture anterolateral right seventh rib. No pneumothorax.  No edema or consolidation.  Mild scarring left base.   Electronically Signed   By: Lowella Grip M.D.   On: 07/06/2014 09:45   Dg Hip Complete Right  07/06/2014   CLINICAL DATA:  Pain post trauma ; history of multiple myeloma  EXAM: RIGHT HIP - COMPLETE 2+ VIEW  COMPARISON:  August 17, 2013  FINDINGS: Frontal pelvis as well as frontal and lateral right hip images were obtained. There is postoperative change in the proximal right femur. No acute fracture or dislocation is appreciable. There is moderate symmetric narrowing of both hip joints. There is no apparent blastic or lytic bone lesion. There is myositis ossificans in the area of previous trauma on the right. There is lower lumbar levoscoliosis. There are multiple foci of arterial vascular calcification.  IMPRESSION: Postoperative change proximal right femur. There is myositis ossificans in this area consistent with old trauma. No acute fracture or dislocation. Moderate symmetric narrowing of both hip joints.   Electronically Signed   By: Lowella Grip M.D.   On: 07/06/2014 09:41   Ct Chest Wo Contrast  07/06/2014   CLINICAL DATA:  Possible lung nodule.  EXAM: CT CHEST WITHOUT CONTRAST  TECHNIQUE: Multidetector CT imaging of the chest was performed following the standard protocol without IV contrast.  COMPARISON:  Chest x-ray 07/06/2014.  Remote chest CT from 2008.  FINDINGS: Chest wall:  No chest wall mass, supraclavicular or axillary lymphadenopathy. The thyroid gland appears  normal. The bony thorax is intact. No destructive bone lesions or spinal canal compromise. There are remote healed rib fractures bilaterally. Surgical changes from bypass surgery are noted.  Mediastinum:  The heart is mildly enlarged. No pericardial effusion. No mediastinal or hilar mass or adenopathy. Small scattered lymph nodes are noted. There is moderate atherosclerotic change involving the aorta and branch vessels including the coronary arteries. The esophagus is grossly normal.  Lungs:  Emphysematous changes are noted and slightly progressive when compared to prior CT scan. There is lower lobe predominant peribronchial thickening and increased interstitial markings. No focal airspace consolidation to suggest pneumonia. No worrisome pulmonary lesions. No pleural effusion.  Upper abdomen:  No significant findings.  IMPRESSION: 1. Emphysematous changes and bronchitic changes but no focal infiltrate or worrisome pulmonary lesion. 2. Moderate atherosclerotic calcifications involving the aorta and branch vessels. 3. Remote rib fractures bilaterally   Electronically Signed   By: Kalman Jewels M.D.   On: 07/06/2014 21:47    ASSESSMENT: 81 y.o.  with kappa light chain multiple myeloma   (1) presenting April 2007 with anemia and renal failure; renal Bx showing free kappa light chain deposition; bone marrow biopsy showing 37% plasma cells, with normal cytogenetics and FISH;bone survey showing skull lytic lesions; treated with   (2) thalidomide (200 mg/d) and dexamethasone (40 mg/d x4d Q28d) June  through Nov 2007, with good response, but poor tolerance;   (3) thalidomide decreased to 100 mg/d, dexamethasone continued, bortezomib (IV) added, Dec 2007 to March 2008   (4) treatment interrupted by multiple complications (peripheral neuropathy, pulmonary embolism, diverticular abscess, CN V zoster, severe DDD, congestive heart failure, rising PSA); maintenance zolendronic acid through March 2012   (5)  progression April 2012, treated initially with dexamethasone alone, poorly tolerated;   (6) bortezomib (sq) resumed July 2012; cyclophosphamide and dexamethasone added Sept 2012; zolendronic acid changed to Q 69month; all treatments held as of March 2013 due to the development of the Left foot ulcer described above  (7) anemia, on aranesp December 2012 to August 2013; resumed Q14d starting May 2015, with slowly rising reticulocyte count  (8) status post Right trochanteric nail with proximal and distal interlock DePuy 11 mm one-pin lag screw, 44 mm distal interlock 08/09/2013 for a right comminuted intertrochanteric fracture  (9) status post removal of the first 2 toes left foot  (10) Left common femoral artery endarterectomy with vein patch angioplasty using left greater saphenous vein 03/24/2014  (11) status post fall with injury to the right hip, large hematoma, [facility]  PLAN:   Dick's kappa light chains do not show evidence of disease progression, and I think his myeloma is the least of his problems at present. We discussed a no salt diet and he understands the changes in taste but will accompany this. If you can actually avoid sodium completely, within 4-6 weeks his taste will have changed and he will at poor sodium and sodium less food will taste much better.  He wants to do something about his lower extremity swelling. I strongly suggested he use the compression stockings that you're ready cats. They are difficult to put on. If he wishes he can use ACE wraps, which are easier to put on. Avoiding sodium will help. I would leave any decision regarding diuretics to his nephrologist.  He is taking nonsteroidals together with his bylaw that. I explained that that can damage the kidneys and you should take Tylenol with a dialogue it instead.  His reticulocyte count was trending up before his treatments were interrupted by his recent fall. We  are resuming the Aranesp today at the same dose. He will receive 200 mcg every 2 weeks. I will check a ferritin with the next dose. She will see me again in November. We are following his myeloma labs on a monthly basis.  Date has a good understanding of the overall plan. He agrees with it. If he is persistent on his physical therapy at home he should be able to be out of the wheelchair by Christmas time, whether using her walker or not. He knows to call for any problems that may develop before his next visit here.    MAGRINAT,GUSTAV C    07/31/2014

## 2014-07-31 NOTE — Addendum Note (Signed)
Addended by: Laureen Abrahams on: 07/31/2014 06:38 PM   Modules accepted: Orders

## 2014-07-31 NOTE — Telephone Encounter (Signed)
, °

## 2014-08-03 LAB — KAPPA/LAMBDA LIGHT CHAINS
KAPPA LAMBDA RATIO: 29.36 — AB (ref 0.26–1.65)
Kappa free light chain: 77.5 mg/dL — ABNORMAL HIGH (ref 0.33–1.94)
Lambda Free Lght Chn: 2.64 mg/dL — ABNORMAL HIGH (ref 0.57–2.63)

## 2014-08-05 ENCOUNTER — Telehealth: Payer: Self-pay | Admitting: Emergency Medicine

## 2014-08-05 ENCOUNTER — Other Ambulatory Visit: Payer: Self-pay | Admitting: Oncology

## 2014-08-05 NOTE — Telephone Encounter (Signed)
Patient's spouse calling to request if Dr [name] will prescribe Timothy [name] Trazodone for sleep. Will forward to Dr [name] for further directions.

## 2014-08-06 ENCOUNTER — Other Ambulatory Visit: Payer: Self-pay | Admitting: Emergency Medicine

## 2014-08-07 ENCOUNTER — Ambulatory Visit (INDEPENDENT_AMBULATORY_CARE_PROVIDER_SITE_OTHER): Payer: Medicare Other | Admitting: Podiatrist

## 2014-08-07 ENCOUNTER — Encounter: Payer: Self-pay | Admitting: Podiatrist

## 2014-08-07 ENCOUNTER — Other Ambulatory Visit: Payer: Self-pay | Admitting: Emergency Medicine

## 2014-08-07 VITALS — BP 150/68 | HR 63 | Resp 15 | Ht 70.0 in | Wt 182.0 lb

## 2014-08-07 DIAGNOSIS — IMO0002 Reserved for concepts with insufficient information to code with codable children: Secondary | ICD-10-CM

## 2014-08-07 DIAGNOSIS — I2510 Atherosclerotic heart disease of native coronary artery without angina pectoris: Secondary | ICD-10-CM

## 2014-08-07 MED ORDER — TRAZODONE HCL 50 MG PO TABS: 50.0000 mg | ORAL_TABLET | Freq: Every evening | ORAL | Status: DC | PRN

## 2014-08-07 NOTE — Patient Instructions (Signed)
Soak Instructions     Place 1/4 cup of epsom salts in a quart of warm tap water.  Submerge your foot and you may soak in the solution for 20 minutes.  Blot the foot dry and be sure to milk out any fluid that may accumulate.

## 2014-08-07 NOTE — Progress Notes (Signed)
   Subjective:    Patient ID: Timothy [name], male    DOB: 04/20/1928, 80 y.o.   MRN: [id number]  HPI Comments: Pt states he feels his bedroom slipper may have caused the blood blister, when he pushes his wheelchair with the left foot.  They noticed the blood blister yesterday.     Review of Systems  Neurological: Positive for dizziness.       Falling and dizziness.  All other systems reviewed and are negative.      Objective:   Physical Exam Neurovascular status is unchanged with nonpalpable pedal pulses left. Capillary refill time is decreased left. Healed amputation site of the first and second toes is noted. Significant decrease in swelling is noted from his previous visit. Neuropathy continues to be present left greater than right. Right second toe has bruising on the plantar and dorsal aspect. It does look slightly contracted and it appears that it's been jammed. Lateral aspect of the left fifth metatarsal head reveals a superficial dried blood blister. Very light debridement is performed and intact integument is present beneath the blood blister. No swelling, no lymphangitis, no sign of infection is present. Healed ulceration plantar left heel is noted as well.     Assessment & Plan:  Blood blister left lateral foot. PVD, healed amputation left second toe  Plan: Light debridement of the blood blister was performed and Silvadene cream and a light dressing was applied. Recommended continued offloading of the area and he will discontinue the wear of his bedroom slippers at home if this is how he thinks it started. I will see him back as needed for followup or if the area failed to heal in one week. Otherwise he'll be seen back.

## 2014-08-10 ENCOUNTER — Other Ambulatory Visit: Payer: Self-pay | Admitting: Oncology

## 2014-08-10 NOTE — Progress Notes (Unsigned)
Date call tonight to let me know his blood pressure was very high and he was "fuzzy headed". We went over his medications and he is on a good dose of lisinopril and also caloric. We could add a diuretic but I am on comfortable doing that in someone with myeloma, since dehydration might damage his kidneys. I think is best that as far as the blood pressure is concerned as to contact his cardiologist, Dr. Martinique.  If after a couple of days of corrected blood pressure date still feels dizzy, I will set him up for an MRI of the brain. He will call let me know.

## 2014-08-11 ENCOUNTER — Encounter: Payer: Self-pay | Admitting: Nurse Practitioner

## 2014-08-11 ENCOUNTER — Telehealth: Payer: Self-pay | Admitting: Cardiology

## 2014-08-11 ENCOUNTER — Ambulatory Visit (INDEPENDENT_AMBULATORY_CARE_PROVIDER_SITE_OTHER): Payer: Medicare Other | Admitting: Nurse Practitioner

## 2014-08-11 VITALS — BP 148/76 | HR 63 | Ht 70.0 in | Wt 185.8 lb

## 2014-08-11 DIAGNOSIS — I10 Essential (primary) hypertension: Secondary | ICD-10-CM

## 2014-08-11 DIAGNOSIS — E038 Other specified hypothyroidism: Secondary | ICD-10-CM

## 2014-08-11 DIAGNOSIS — I2510 Atherosclerotic heart disease of native coronary artery without angina pectoris: Secondary | ICD-10-CM

## 2014-08-11 LAB — URINALYSIS, ROUTINE W REFLEX MICROSCOPIC
Bilirubin Urine: NEGATIVE
Hgb urine dipstick: NEGATIVE
Ketones, ur: NEGATIVE
Leukocytes, UA: NEGATIVE
Nitrite: NEGATIVE
RBC / HPF: NONE SEEN (ref 0–?)
Specific Gravity, Urine: 1.01 (ref 1.000–1.030)
Total Protein, Urine: 30 — AB
Urine Glucose: NEGATIVE
Urobilinogen, UA: 0.2 (ref 0.0–1.0)
WBC, UA: NONE SEEN — AB (ref 0–?)
pH: 6.5 (ref 5.0–8.0)

## 2014-08-11 LAB — BASIC METABOLIC PANEL
BUN: 12 mg/dL (ref 6–23)
CO2: 24 mEq/L (ref 19–32)
Calcium: 9 mg/dL (ref 8.4–10.5)
Chloride: 96 mEq/L (ref 96–112)
Creatinine, Ser: 1 mg/dL (ref 0.4–1.5)
GFR: 76.57 mL/min (ref >60.00)
Glucose, Bld: 108 mg/dL — ABNORMAL HIGH (ref 70–99)
Potassium: 4 mEq/L (ref 3.5–5.1)
Sodium: 126 mEq/L — ABNORMAL LOW (ref 135–145)

## 2014-08-11 LAB — CBC
HCT: 29.9 % — ABNORMAL LOW (ref 39.0–52.0)
Hemoglobin: 9.8 g/dL — ABNORMAL LOW (ref 13.0–17.0)
MCHC: 32.9 g/dL (ref 30.0–36.0)
MCV: 88.7 fl (ref 78.0–100.0)
Platelets: 223 10*3/uL (ref 150.0–400.0)
RBC: 3.37 Mil/uL — ABNORMAL LOW (ref 4.22–5.81)
RDW: 20.3 % — ABNORMAL HIGH (ref 11.5–15.5)
WBC: 6.7 10*3/uL (ref 4.0–10.5)

## 2014-08-11 LAB — TSH: TSH: 1.53 u[IU]/mL (ref 0.35–4.50)

## 2014-08-11 MED ORDER — AMLODIPINE BESYLATE 2.5 MG PO TABS: 2.5000 mg | ORAL_TABLET | Freq: Every day | ORAL | Status: DC

## 2014-08-11 NOTE — Telephone Encounter (Signed)
WIFE CALLED CONCERNED ABOUT HER HUSBAND SHE STATES PATIENT HAS BEEN HAVING INCREASE BLOOD PRESSURE SINCE Friday--- RANGE 165-170/60-65.PATIENT TAKES LISINOPRIL 20 MG EVERY EVENING. AS WELL AS BREATHING IS SHALLOW AT REST, NAUSEA , DIZZINESS. NO CHEST PAIN.  WIFE STATES PATIENT HAS MULTI-PROBLEMS-- CANCER , FELL SEVERAL WEEKS AND HAS BEEN IN REHAB WAS RELEASED @2 -3 WEEKS AGO.  WIFE STATES SHE CONTACTED Dr Lonia Chimera - HIS OFFICE INFORMED HER TO CONTACT DR Martinique. SPOKE WITH CHERYL LPN- APPOINTMENT SCHEDULE FOR 10 AM WITH LORI GERHARDT NP AT [facility]. WIFE VERBALIZED UNDERSTANDING.

## 2014-08-11 NOTE — Patient Instructions (Addendum)
Hold the thyroid medicine for now  I am adding low dose Norvasc at 2.5 mg a day - this is for his BP  Monitor his BP at home - if the BP comes back down - you can hold the Norvasc  Try to get [facility] cuff [facility] - if not - bring back here and we will check  We will check labs today  See me in 2 weeks  Call the [place] office at [phone] if you have any questions, problems or concerns.

## 2014-08-11 NOTE — Progress Notes (Signed)
Timothy [name] Date of Birth: 1928/05/20 Medical Record #[id number]  History of Present Illness: Timothy [name] is seen back today for a work in visit. Seen for Dr. Martinique. He has a history of CAD, status post CABG in 2002, ischemic cardiomyopathy -  EF of 35 to 40% per echo from August of 2585, chronic systolic CHF, multiple myeloma - followed closely by oncology, prior to amputations for osteomyelitis, CKD, HL, anemia, depression, neuropathy, gout. Lexiscan Myoview (12/18/12): Inf and IL scar with minimal peri-infarct ischemia, EF 41%; Intermediate Risk. Med Rx continued.   Last seen by Dr. Martinique 07/2013. He was given Lasix for one week due to some volume excess. Saw Richardson Dopp, [place] back in March of 2015. He is been followed by podiatry for left second toe ulcer as well as left heel ulcer. ABIs 01/2014 were abnormal with suggestion of significant inflow disease on the left by CFA wave forms and greater than 50% left SFA disease; distal right SFA with short area of occlusion versus calcific shadowing. He has seen Dr. Gwenlyn Found and Dr. Scot Dock and has had left common femoral endarterectomy and patch angioplasty.   I saw him back in early August - he was doing ok - was falling and very limited by his balance. Sodium was low. It improved with fluid restriction.   Since then, he has fallen again, ended up at rehab and was released just 2 to 3 weeks ago. BP now running high. Wife felt like breathing is shallow. He has been nauseated and dizzy. Thus added to my schedule today. Saw Dr. [name] about 10 days ago - started back on Aranesp.  Comes in today. Here with his wife. Moving pretty slow. His wife gives a lot of the history. He feels bad. Mostly dizzy. BP is up - this is unusual for him. Weight is back stable for him. Not much swelling in his legs. Really watching his salt and his fluid intake. Urine pretty strong. Some occasional nausea as well. Sleeping in the day and up all night to void. Remains fatigued. No  more falls since he has been home. He has been put on thyroid medicine since he was here. What is interesting is that his TSH was normal on 07/06/14 and then 7 on 07/07/14. He has never had thyroid issues. T4 normal.   Current Outpatient Prescriptions  Medication Sig Dispense Refill  . allopurinol (ZYLOPRIM) 300 MG tablet Take 0.5 tablets (150 mg total) by mouth daily.  90 tablet  3  . aspirin EC 81 MG tablet Take 81 mg by mouth 3 (three) times a week. Monday, Wednesday, Friday      . atorvastatin (LIPITOR) 10 MG tablet Take 1 tablet (10 mg total) by mouth every evening.  90 tablet  3  . B Complex-Biotin-FA (B COMPLETE PO) Take 1 tablet by mouth daily.       . carvedilol (COREG) 6.25 MG tablet Take 1 tablet (6.25 mg total) by mouth 2 (two) times daily with a meal.  180 tablet  3  . Coenzyme Q10 (CO Q-10 PO) Take 1 tablet by mouth every morning.       . darbepoetin (ARANESP) 200 MCG/0.4ML SOLN injection Inject 200 mcg into the skin as directed.      Marland Kitchen HYDROmorphone (DILAUDID) 4 MG tablet Take 2 tablets (8 mg total) by mouth as needed for severe pain.  10 tablet  0  . levothyroxine (SYNTHROID, LEVOTHROID) 50 MCG tablet Take 1 tablet (50 mcg total) by mouth daily  before breakfast.  30 tablet  0  . lisinopril (PRINIVIL,ZESTRIL) 20 MG tablet Take 20 mg by mouth daily.      . Multiple Vitamin (MULTIVITAMIN) capsule Take 1 capsule by mouth every morning.       . nitroGLYCERIN (NITROSTAT) 0.4 MG SL tablet Place 0.4 mg under the tongue every 5 (five) minutes as needed for chest pain.      . polyethylene glycol (MIRALAX / GLYCOLAX) packet Take 17 g by mouth daily as needed.  14 each  0  . rOPINIRole (REQUIP) 3 MG tablet Take 3 mg by mouth at bedtime.       . tamsulosin (FLOMAX) 0.4 MG CAPS capsule Take 0.4 mg by mouth at bedtime.      Marland Kitchen tobramycin (TOBREX) 0.3 % ophthalmic solution Place 1 drop into both eyes daily as needed (for Bacterial eye infection).       . traZODone (DESYREL) 50 MG tablet Take 1-2  tablets (50-100 mg total) by mouth at bedtime as needed for sleep.  60 tablet  1  . vitamin C (ASCORBIC ACID) 500 MG tablet Take 500 mg by mouth every morning.       No current facility-administered medications for this visit.    Allergies  Allergen Reactions  . Morphine And Related Other (See Comments)    Agitated with morphine drip    Past Medical History  Diagnosis Date  . ASCVD (arteriosclerotic cardiovascular disease)   . Anemia     chronic mild anemia  . Gout   . Hypertension   . Lumbar disc disease     post laminectomy  . Chronic back pain   . History of echocardiogram 11/02/2010    EF range of 30 to 35% / There is hypokinsesis of the basal-mild inferolateral myocardium  . CHF (congestive heart failure)   . Heart murmur   . Ischemic cardiomyopathy   . Renal insufficiency   . Peripheral neuropathy   . Inferior myocardial infarction 1986  . Hyperlipidemia   . SOB (shortness of breath)   . Fatigue   . Peripheral neuropathy   . Coronary artery disease     CABG 2002 by Dr. Roxy Manns with LIMA to LAD, SVG to intermediate, SVG to LCX, & SVG to PL. Last nuclear in 2012 showing large inferior scar with EF of 39%.   . Blood transfusion     DEC 2012 - TWO UNITS  . Myeloma     Multiple myeloma, with recurrence of increasing problems  DR. [place] -ONCOLOGIST.  PT HAS BEEN OFF CHEMO SINCE HIS HOSPITALIZATION FEB 2013 FOR LEFT FOOT INFECTION  . Cancer   . History of shingles MARCH 2009    SHINGLE LESIONS WERE AROUND RIGHT EYE--PT HAS RESIDUAL ITCHING AROUND THE EYE.  . Osteomyelitis     s/p toe amputation  . Chronic systolic heart failure     EF of 30% per echo 02/2012  . Protein malnutrition   . Critical lower limb ischemia     Past Surgical History  Procedure Laterality Date  . Laminectomy    . Inguinal hernia repair    . Cardiac catheterization  06/20/2000    Severe coronary disease (totally occluded right artery, 90% left circumflex, 50-60% intermediate, and 90-95%  ostial left anterior descending)   . Tonsillectomy    . Coronary artery bypass graft  2002    x4 / Left internal mammary artery to the LAD.  / Saphenous vein graft to the right posterolateral.  /  Saphenous  vein graft to  the ramus intermedius. / Saphenous vein graft to the circumflex marginal     . Amputation  03/06/2012    Procedure: AMPUTATION RAY;  Surgeon: Alta Corning, MD;  Location: [facility];  Service: Orthopedics;  Laterality: Left;  First Ray Amputation  . Intramedullary (im) nail intertrochanteric Right 08/17/2013    Procedure: INTRAMEDULLARY (IM) NAIL INTERTROCHANTRIC;  Surgeon: Marybelle Killings, MD;  Location: [facility];  Service: Orthopedics;  Laterality: Right;  . Endarterectomy femoral Left 03/24/2014    Procedure: Left Common Femoral  Artery Endarterectomy with Vein Patch Angioplasty,  Ultrasound ;  Surgeon: Angelia Mould, MD;  Location: [facility];  Service: Vascular;  Laterality: Left;  . Toe amputation      History  Smoking status  . Former Smoker -- 1.00 packs/day for 20 years  . Types: Cigarettes  . Quit date: 11/20/1984  Smokeless tobacco  . Never Used    History  Alcohol Use No    No family history on file.  Review of Systems: The review of systems is per the HPI.  All other systems were reviewed and are negative.  Physical Exam: BP 148/76  Pulse 63  Ht $R'5\' 10"'VS$  (1.778 m)  Wt 185 lb 12.8 oz (84.278 kg)  BMI 26.66 kg/m2  SpO2 98% BP is 160/80 by me.  Patient is very pleasant and in no acute distress. He looks pretty frail today. His weight is down 11 pounds over the past week - but back to his baseline from August. Skin is warm and dry. Color is normal.  HEENT is unremarkable. Normocephalic/atraumatic. PERRL. Sclera are nonicteric. Neck is supple. No masses. No JVD. Lungs are clear. Cardiac exam shows a regular rate and rhythm. Abdomen is soft. Extremities are with just trace edema. Gait and ROM are intact. He is unsteady. Using his walker.  No gross neurologic  deficits noted.  Wt Readings from Last 3 Encounters:  08/11/14 185 lb 12.8 oz (84.278 kg)  08/07/14 182 lb (82.555 kg)  07/31/14 193 lb 1.6 oz (87.59 kg)    LABORATORY DATA/PROCEDURES: PENDING   Lab Results  Component Value Date   WBC 5.6 07/31/2014   HGB 8.6* 07/31/2014   HCT 27.3* 07/31/2014   PLT 200 07/31/2014   GLUCOSE 94 07/31/2014   CHOL 102 08/13/2013   TRIG 75 08/13/2013   HDL 37* 08/13/2013   LDLCALC 50 08/13/2013   ALT 8 07/31/2014   AST 20 07/31/2014   NA 137 07/31/2014   K 4.5 07/31/2014   CL 94* 07/10/2014   CREATININE 1.1 07/31/2014   BUN 18.2 07/31/2014   CO2 25 07/31/2014   TSH 7.300* 07/07/2014   PSA 3.75 05/30/2006   INR 1.15 03/23/2014    BNP (last 3 results)  Recent Labs  06/22/14 1146  PROBNP 88.0   Echo Study Conclusions from August 2014  - Left ventricle: The cavity size was mildly dilated. Wall thickness was normal. Systolic function was moderately reduced. The estimated ejection fraction was in the range of 35% to 40%. Akinesis of the basalinferior myocardium; consistent with infarction. There was an increased relative contribution of atrial contraction to ventricular filling. - Left atrium: The atrium was moderately dilated. - Right ventricle: Systolic function was mildly reduced.    Assessment / Plan: 1. Weakness - dizziness - elevated BP - not sure what to make of his symptoms. I am adding just low dose Norvasc at 2.5 mg a day. Need to check his cuff for correlation.  2. ?Hypothyroidism - he has never had issues with his thyroid. Not sure why he had 2 TSH's drawn in 24 hour period - one completely normal and one just a little elevated. I am rechecking today. Holding his thyroid medicine for now.   3. Failure to thrive - most likely multifactorial  4. Hyponatremia - followed by renal  Check labs today. Check UA. Hold thyroid medicine. Low dose Norvasc. See back in 2 weeks.   Patient is agreeable to this plan and will call if any problems  develop in the interim.   Burtis Junes, RN, [facility] [address] [place],   [address] [phone]

## 2014-08-12 ENCOUNTER — Other Ambulatory Visit: Payer: Medicare Other

## 2014-08-13 ENCOUNTER — Telehealth: Payer: Self-pay | Admitting: Nurse Practitioner

## 2014-08-13 NOTE — Telephone Encounter (Signed)
New Problem:    Pt's wife called needs the test results please.

## 2014-08-13 NOTE — Telephone Encounter (Signed)
Pt is aware of lab results.

## 2014-08-14 ENCOUNTER — Telehealth: Payer: Self-pay | Admitting: Oncology

## 2014-08-14 ENCOUNTER — Other Ambulatory Visit (HOSPITAL_BASED_OUTPATIENT_CLINIC_OR_DEPARTMENT_OTHER): Payer: Medicare Other

## 2014-08-14 ENCOUNTER — Ambulatory Visit (HOSPITAL_BASED_OUTPATIENT_CLINIC_OR_DEPARTMENT_OTHER): Payer: Medicare Other

## 2014-08-14 VITALS — BP 152/54 | HR 55 | Temp 97.7°F

## 2014-08-14 DIAGNOSIS — D649 Anemia, unspecified: Secondary | ICD-10-CM

## 2014-08-14 DIAGNOSIS — C9000 Multiple myeloma not having achieved remission: Secondary | ICD-10-CM

## 2014-08-14 DIAGNOSIS — D631 Anemia in chronic kidney disease: Secondary | ICD-10-CM

## 2014-08-14 DIAGNOSIS — N19 Unspecified kidney failure: Secondary | ICD-10-CM

## 2014-08-14 DIAGNOSIS — N289 Disorder of kidney and ureter, unspecified: Secondary | ICD-10-CM

## 2014-08-14 DIAGNOSIS — N039 Chronic nephritic syndrome with unspecified morphologic changes: Secondary | ICD-10-CM

## 2014-08-14 LAB — CBC & DIFF AND RETIC
BASO%: 0.2 % (ref 0.0–2.0)
Basophils Absolute: 0 10*3/uL (ref 0.0–0.1)
EOS%: 2.9 % (ref 0.0–7.0)
Eosinophils Absolute: 0.2 10*3/uL (ref 0.0–0.5)
HEMATOCRIT: 28 % — AB (ref 38.4–49.9)
HGB: 9.1 g/dL — ABNORMAL LOW (ref 13.0–17.1)
IMMATURE RETIC FRACT: 8.4 % (ref 3.00–10.60)
LYMPH%: 32.9 % (ref 14.0–49.0)
MCH: 28.7 pg (ref 27.2–33.4)
MCHC: 32.5 g/dL (ref 32.0–36.0)
MCV: 88.3 fL (ref 79.3–98.0)
MONO#: 0.7 10*3/uL (ref 0.1–0.9)
MONO%: 13.2 % (ref 0.0–14.0)
NEUT#: 2.8 10*3/uL (ref 1.5–6.5)
NEUT%: 50.8 % (ref 39.0–75.0)
Platelets: 197 10*3/uL (ref 140–400)
RBC: 3.17 10*6/uL — ABNORMAL LOW (ref 4.20–5.82)
RDW: 18.3 % — ABNORMAL HIGH (ref 11.0–14.6)
Retic %: 2.75 % — ABNORMAL HIGH (ref 0.80–1.80)
Retic Ct Abs: 87.18 10*3/uL (ref 34.80–93.90)
WBC: 5.5 10*3/uL (ref 4.0–10.3)
lymph#: 1.8 10*3/uL (ref 0.9–3.3)

## 2014-08-14 LAB — FERRITIN CHCC: Ferritin: 241 ng/ml (ref 22–316)

## 2014-08-14 MED ORDER — DARBEPOETIN ALFA-POLYSORBATE 200 MCG/0.4ML IJ SOLN
200.0000 ug | Freq: Once | INTRAMUSCULAR | Status: AC
  Administered 2014-08-14: 200 ug via SUBCUTANEOUS
  Filled 2014-08-14: qty 0.4

## 2014-08-14 NOTE — Telephone Encounter (Signed)
, °

## 2014-08-25 ENCOUNTER — Encounter: Payer: Self-pay | Admitting: Nurse Practitioner

## 2014-08-25 ENCOUNTER — Ambulatory Visit (INDEPENDENT_AMBULATORY_CARE_PROVIDER_SITE_OTHER): Payer: Medicare Other | Admitting: Nurse Practitioner

## 2014-08-25 VITALS — BP 150/62 | HR 54 | Ht 70.0 in | Wt 192.0 lb

## 2014-08-25 DIAGNOSIS — IMO0001 Reserved for inherently not codable concepts without codable children: Secondary | ICD-10-CM

## 2014-08-25 DIAGNOSIS — I10 Essential (primary) hypertension: Secondary | ICD-10-CM

## 2014-08-25 DIAGNOSIS — I2589 Other forms of chronic ischemic heart disease: Secondary | ICD-10-CM

## 2014-08-25 DIAGNOSIS — N289 Disorder of kidney and ureter, unspecified: Secondary | ICD-10-CM

## 2014-08-25 DIAGNOSIS — I255 Ischemic cardiomyopathy: Secondary | ICD-10-CM

## 2014-08-25 DIAGNOSIS — I2510 Atherosclerotic heart disease of native coronary artery without angina pectoris: Secondary | ICD-10-CM

## 2014-08-25 DIAGNOSIS — R0602 Shortness of breath: Secondary | ICD-10-CM

## 2014-08-25 NOTE — Patient Instructions (Signed)
We will be checking the following labs today TSH  I will see you back as planned in December  Continue to monitor your blood pressure at home  Call the [facility] office at [phone] if you have any questions, problems or concerns.

## 2014-08-25 NOTE — Progress Notes (Addendum)
Timothy [name] Date of Birth: Oct 02, 1928 Medical Record #[id number]  History of Present Illness: Timothy [name] is seen back today for a 2 week visit. Seen for Dr. Martinique. He has a history of CAD, status post CABG in 2002, ischemic cardiomyopathy - EF of 35 to 40% per echo from August of 0865, chronic systolic CHF, multiple myeloma - followed closely by oncology, prior to amputations for osteomyelitis, CKD, HL, anemia, depression, neuropathy, gout. Lexiscan Myoview (12/18/12): Inf and IL scar with minimal peri-infarct ischemia, EF 41%; Intermediate Risk. Med Rx continued.   Last seen by Dr. Martinique 07/2013. He was given Lasix for one week due to some volume excess. Saw Timothy [name], [place] back in March of 2015. He is been followed by podiatry for left second toe ulcer as well as left heel ulcer. ABIs 01/2014 were abnormal with suggestion of significant inflow disease on the left by CFA wave forms and greater than 50% left SFA disease; distal right SFA with short area of occlusion versus calcific shadowing. He has seen Dr. Gwenlyn Found and Dr. Scot Dock and has had left common femoral endarterectomy and patch angioplasty.   I saw him back in early August - he was doing ok - was falling and very limited by his balance. Sodium was low. It improved with fluid restriction.   I saw him 2 weeks ago - he had fallen again, ended up at rehab and had been released several weeks prior to the visit with me. BP now running high. Wife felt like breathing is shallow. He has been nauseated and dizzy. Had been put on thyroid medicine - but what was interesting was that his TSH was normal on 07/06/14 and then 7 on 07/07/14. He has never had thyroid issues in the past. T4 normal.   Comes in today. Here with his wife.  He is doing better. Timothy [name] thinks he is doing better. Moving around better. No falls. Breathing improved. No chest pain. BP stable at home. His cuff correlates. Getting his Aranesp.   Current Outpatient Prescriptions  Medication  Sig Dispense Refill  . allopurinol (ZYLOPRIM) 300 MG tablet Take 0.5 tablets (150 mg total) by mouth daily.  90 tablet  3  . amLODipine (NORVASC) 2.5 MG tablet Take 1 tablet (2.5 mg total) by mouth daily.  30 tablet  3  . aspirin EC 81 MG tablet Take 81 mg by mouth 3 (three) times a week. Monday, Wednesday, Friday      . atorvastatin (LIPITOR) 10 MG tablet Take 1 tablet (10 mg total) by mouth every evening.  90 tablet  3  . B Complex-Biotin-FA (B COMPLETE PO) Take 1 tablet by mouth daily.       . carvedilol (COREG) 6.25 MG tablet Take 1 tablet (6.25 mg total) by mouth 2 (two) times daily with a meal.  180 tablet  3  . Coenzyme Q10 (CO Q-10 PO) Take 1 tablet by mouth every morning.       . darbepoetin (ARANESP) 200 MCG/0.4ML SOLN injection Inject 200 mcg into the skin as directed.      Marland Kitchen HYDROmorphone (DILAUDID) 4 MG tablet Take 2 tablets (8 mg total) by mouth as needed for severe pain.  10 tablet  0  . lisinopril (PRINIVIL,ZESTRIL) 20 MG tablet Take 20 mg by mouth daily.      . Multiple Vitamin (MULTIVITAMIN) capsule Take 1 capsule by mouth every morning.       . nitroGLYCERIN (NITROSTAT) 0.4 MG SL tablet Place 0.4 mg under the  tongue every 5 (five) minutes as needed for chest pain.      . polyethylene glycol (MIRALAX / GLYCOLAX) packet Take 17 g by mouth daily as needed.  14 each  0  . rOPINIRole (REQUIP) 3 MG tablet Take 3 mg by mouth at bedtime.       . tamsulosin (FLOMAX) 0.4 MG CAPS capsule Take 0.4 mg by mouth at bedtime.      Marland Kitchen tobramycin (TOBREX) 0.3 % ophthalmic solution Place 1 drop into both eyes daily as needed (for Bacterial eye infection).       . vitamin C (ASCORBIC ACID) 500 MG tablet Take 500 mg by mouth every morning.       No current facility-administered medications for this visit.    Allergies  Allergen Reactions  . Morphine And Related Other (See Comments)    Agitated with morphine drip    Past Medical History  Diagnosis Date  . ASCVD (arteriosclerotic  cardiovascular disease)   . Anemia     chronic mild anemia  . Gout   . Hypertension   . Lumbar disc disease     post laminectomy  . Chronic back pain   . History of echocardiogram 11/02/2010    EF range of 30 to 35% / There is hypokinsesis of the basal-mild inferolateral myocardium  . CHF (congestive heart failure)   . Heart murmur   . Ischemic cardiomyopathy   . Renal insufficiency   . Peripheral neuropathy   . Inferior myocardial infarction 1986  . Hyperlipidemia   . SOB (shortness of breath)   . Fatigue   . Peripheral neuropathy   . Coronary artery disease     CABG 2002 by Dr. Roxy Manns with LIMA to LAD, SVG to intermediate, SVG to LCX, & SVG to PL. Last nuclear in 2012 showing large inferior scar with EF of 39%.   . Blood transfusion     DEC 2012 - TWO UNITS  . Myeloma     Multiple myeloma, with recurrence of increasing problems  DR. [place] -ONCOLOGIST.  PT HAS BEEN OFF CHEMO SINCE HIS HOSPITALIZATION FEB 2013 FOR LEFT FOOT INFECTION  . Cancer   . History of shingles MARCH 2009    SHINGLE LESIONS WERE AROUND RIGHT EYE--PT HAS RESIDUAL ITCHING AROUND THE EYE.  . Osteomyelitis     s/p toe amputation  . Chronic systolic heart failure     EF of 30% per echo 02/2012  . Protein malnutrition   . Critical lower limb ischemia     Past Surgical History  Procedure Laterality Date  . Laminectomy    . Inguinal hernia repair    . Cardiac catheterization  06/20/2000    Severe coronary disease (totally occluded right artery, 90% left circumflex, 50-60% intermediate, and 90-95% ostial left anterior descending)   . Tonsillectomy    . Coronary artery bypass graft  2002    x4 / Left internal mammary artery to the LAD.  / Saphenous vein graft to the right posterolateral.  /  Saphenous vein graft to  the ramus intermedius. / Saphenous vein graft to the circumflex marginal     . Amputation  03/06/2012    Procedure: AMPUTATION RAY;  Surgeon: Alta Corning, MD;  Location: [facility];  Service:  Orthopedics;  Laterality: Left;  First Ray Amputation  . Intramedullary (im) nail intertrochanteric Right 08/17/2013    Procedure: INTRAMEDULLARY (IM) NAIL INTERTROCHANTRIC;  Surgeon: Marybelle Killings, MD;  Location: [facility];  Service: Orthopedics;  Laterality: Right;  .  Endarterectomy femoral Left 03/24/2014    Procedure: Left Common Femoral  Artery Endarterectomy with Vein Patch Angioplasty,  Ultrasound ;  Surgeon: Angelia Mould, MD;  Location: [facility];  Service: Vascular;  Laterality: Left;  . Toe amputation      History  Smoking status  . Former Smoker -- 1.00 packs/day for 20 years  . Types: Cigarettes  . Quit date: 11/20/1984  Smokeless tobacco  . Never Used    History  Alcohol Use No    History reviewed. No pertinent family history.  Review of Systems: The review of systems is per the HPI.  All other systems were reviewed and are negative.  Physical Exam: BP 150/62  Pulse 54  Ht $R'5\' 10"'KM$  (1.778 m)  Wt 192 lb (87.091 kg)  BMI 27.55 kg/m2  SpO2 99% Patient is very pleasant and in no acute distress. He looks better today. Moving faster.  Skin is warm and dry. Color is normal.  HEENT is unremarkable. Normocephalic/atraumatic. PERRL. Sclera are nonicteric. Neck is supple. No masses. No JVD. Lungs are clear. Cardiac exam shows a regular rate and rhythm. Abdomen is soft. Extremities are without edema. Gait and ROM are intact. No gross neurologic deficits noted.  Wt Readings from Last 3 Encounters:  08/25/14 192 lb (87.091 kg)  08/11/14 185 lb 12.8 oz (84.278 kg)  08/07/14 182 lb (82.555 kg)    LABORATORY DATA/PROCEDURES:  Lab Results  Component Value Date   WBC 5.5 08/14/2014   HGB 9.1* 08/14/2014   HCT 28.0* 08/14/2014   PLT 197 08/14/2014   GLUCOSE 108* 08/11/2014   CHOL 102 08/13/2013   TRIG 75 08/13/2013   HDL 37* 08/13/2013   LDLCALC 50 08/13/2013   ALT 8 07/31/2014   AST 20 07/31/2014   NA 126* 08/11/2014   K 4.0 08/11/2014   CL 96 08/11/2014   CREATININE 1.0 08/11/2014     BUN 12 08/11/2014   CO2 24 08/11/2014   TSH 1.53 08/11/2014   PSA 3.75 05/30/2006   INR 1.15 03/23/2014    BNP (last 3 results)  Recent Labs  06/22/14 1146  PROBNP 88.0   Echo Study Conclusions from August 2014  - Left ventricle: The cavity size was mildly dilated. Wall thickness was normal. Systolic function was moderately reduced. The estimated ejection fraction was in the range of 35% to 40%. Akinesis of the basalinferior myocardium; consistent with infarction. There was an increased relative contribution of atrial contraction to ventricular filling. - Left atrium: The atrium was moderately dilated. - Right ventricle: Systolic function was mildly reduced.  Assessment / Plan:  1. Weakness - dizziness - elevated BP - this has improved.   2. ?Hypothyroidism - he has never had issues with his thyroid. Will recheck his lab today.  3. Failure to thrive - most likely multifactorial - this seems improved.   4. Hyponatremia - followed by renal   I will see back as planned in December.   Patient is agreeable to this plan and will call if any problems develop in the interim.   Burtis Junes, RN, [facility] [address] [place], [place]  [address] [phone]  Addendum:  Received records 09/02/2014 from [name] - she feels that the patient has SIADH as the proximate cause for his hyponatremia. She has advised continued water restriction. Also has noted that low dose Lasix or other loop diuretic could be used to increase free water cleraance if hyponatremia worsens,  but to use judiciously. No thiazide diuretics. She will see back prn.

## 2014-08-26 LAB — TSH: TSH: 4.32 u[IU]/mL (ref 0.35–4.50)

## 2014-08-28 ENCOUNTER — Ambulatory Visit (HOSPITAL_BASED_OUTPATIENT_CLINIC_OR_DEPARTMENT_OTHER): Payer: Medicare Other

## 2014-08-28 ENCOUNTER — Other Ambulatory Visit: Payer: Self-pay | Admitting: *Deleted

## 2014-08-28 ENCOUNTER — Ambulatory Visit: Payer: Medicare Other

## 2014-08-28 ENCOUNTER — Other Ambulatory Visit (HOSPITAL_BASED_OUTPATIENT_CLINIC_OR_DEPARTMENT_OTHER): Payer: Medicare Other

## 2014-08-28 ENCOUNTER — Other Ambulatory Visit: Payer: Medicare Other

## 2014-08-28 VITALS — BP 137/55 | HR 69 | Temp 97.5°F

## 2014-08-28 DIAGNOSIS — C9000 Multiple myeloma not having achieved remission: Secondary | ICD-10-CM

## 2014-08-28 DIAGNOSIS — N289 Disorder of kidney and ureter, unspecified: Secondary | ICD-10-CM

## 2014-08-28 LAB — CBC WITH DIFFERENTIAL/PLATELET
BASO%: 1 % (ref 0.0–2.0)
Basophils Absolute: 0.1 10*3/uL (ref 0.0–0.1)
EOS%: 4 % (ref 0.0–7.0)
Eosinophils Absolute: 0.2 10*3/uL (ref 0.0–0.5)
HEMATOCRIT: 29.9 % — AB (ref 38.4–49.9)
HGB: 9.5 g/dL — ABNORMAL LOW (ref 13.0–17.1)
LYMPH#: 1.7 10*3/uL (ref 0.9–3.3)
LYMPH%: 34 % (ref 14.0–49.0)
MCH: 28.8 pg (ref 27.2–33.4)
MCHC: 31.7 g/dL — AB (ref 32.0–36.0)
MCV: 90.6 fL (ref 79.3–98.0)
MONO#: 0.5 10*3/uL (ref 0.1–0.9)
MONO%: 10.6 % (ref 0.0–14.0)
NEUT#: 2.6 10*3/uL (ref 1.5–6.5)
NEUT%: 50.4 % (ref 39.0–75.0)
Platelets: 194 10*3/uL (ref 140–400)
RBC: 3.3 10*6/uL — ABNORMAL LOW (ref 4.20–5.82)
RDW: 19.4 % — ABNORMAL HIGH (ref 11.0–14.6)
WBC: 5.1 10*3/uL (ref 4.0–10.3)

## 2014-08-28 LAB — COMPREHENSIVE METABOLIC PANEL (CC13)
ALT: 12 U/L (ref 0–55)
ANION GAP: 5 meq/L (ref 3–11)
AST: 21 U/L (ref 5–34)
Albumin: 3.5 g/dL (ref 3.5–5.0)
Alkaline Phosphatase: 55 U/L (ref 40–150)
BUN: 19.7 mg/dL (ref 7.0–26.0)
CHLORIDE: 104 meq/L (ref 98–109)
CO2: 25 meq/L (ref 22–29)
Calcium: 9.5 mg/dL (ref 8.4–10.4)
Creatinine: 1.1 mg/dL (ref 0.7–1.3)
Glucose: 107 mg/dl (ref 70–140)
Potassium: 4.2 mEq/L (ref 3.5–5.1)
SODIUM: 133 meq/L — AB (ref 136–145)
TOTAL PROTEIN: 7 g/dL (ref 6.4–8.3)
Total Bilirubin: 0.73 mg/dL (ref 0.20–1.20)

## 2014-08-28 LAB — PROTEIN / CREATININE RATIO, URINE
Creatinine, Urine: 65.5 mg/dL
Protein Creatinine Ratio: 0.46 — ABNORMAL HIGH (ref <0.15)
TOTAL PROTEIN, URINE: 30 mg/dL — AB (ref 5–25)

## 2014-08-28 MED ORDER — DARBEPOETIN ALFA-POLYSORBATE 200 MCG/0.4ML IJ SOLN
200.0000 ug | Freq: Once | INTRAMUSCULAR | Status: AC
  Administered 2014-08-28: 200 ug via SUBCUTANEOUS
  Filled 2014-08-28: qty 0.4

## 2014-08-31 LAB — KAPPA/LAMBDA LIGHT CHAINS
KAPPA FREE LGHT CHN: 78.6 mg/dL — AB (ref 0.33–1.94)
KAPPA LAMBDA RATIO: 27.2 — AB (ref 0.26–1.65)
Lambda Free Lght Chn: 2.89 mg/dL — ABNORMAL HIGH (ref 0.57–2.63)

## 2014-09-01 ENCOUNTER — Encounter: Payer: Self-pay | Admitting: Nurse Practitioner

## 2014-09-03 ENCOUNTER — Other Ambulatory Visit: Payer: Self-pay | Admitting: *Deleted

## 2014-09-03 MED ORDER — NITROGLYCERIN 0.4 MG SL SUBL: 0.4000 mg | SUBLINGUAL_TABLET | SUBLINGUAL | Status: DC | PRN

## 2014-09-11 ENCOUNTER — Ambulatory Visit (HOSPITAL_BASED_OUTPATIENT_CLINIC_OR_DEPARTMENT_OTHER): Payer: Medicare Other

## 2014-09-11 ENCOUNTER — Other Ambulatory Visit: Payer: Self-pay | Admitting: *Deleted

## 2014-09-11 ENCOUNTER — Other Ambulatory Visit (HOSPITAL_BASED_OUTPATIENT_CLINIC_OR_DEPARTMENT_OTHER): Payer: Medicare Other

## 2014-09-11 VITALS — BP 130/61 | HR 64 | Temp 97.6°F

## 2014-09-11 DIAGNOSIS — C9000 Multiple myeloma not having achieved remission: Secondary | ICD-10-CM

## 2014-09-11 DIAGNOSIS — N19 Unspecified kidney failure: Secondary | ICD-10-CM

## 2014-09-11 DIAGNOSIS — D649 Anemia, unspecified: Secondary | ICD-10-CM

## 2014-09-11 LAB — CBC WITH DIFFERENTIAL/PLATELET
BASO%: 0.7 % (ref 0.0–2.0)
BASOS ABS: 0 10*3/uL (ref 0.0–0.1)
EOS%: 2.6 % (ref 0.0–7.0)
Eosinophils Absolute: 0.1 10*3/uL (ref 0.0–0.5)
HCT: 30.5 % — ABNORMAL LOW (ref 38.4–49.9)
HEMOGLOBIN: 9.8 g/dL — AB (ref 13.0–17.1)
LYMPH#: 1.8 10*3/uL (ref 0.9–3.3)
LYMPH%: 33 % (ref 14.0–49.0)
MCH: 29.1 pg (ref 27.2–33.4)
MCHC: 32.1 g/dL (ref 32.0–36.0)
MCV: 90.5 fL (ref 79.3–98.0)
MONO#: 0.7 10*3/uL (ref 0.1–0.9)
MONO%: 13.1 % (ref 0.0–14.0)
NEUT#: 2.7 10*3/uL (ref 1.5–6.5)
NEUT%: 50.6 % (ref 39.0–75.0)
Platelets: 173 10*3/uL (ref 140–400)
RBC: 3.37 10*6/uL — ABNORMAL LOW (ref 4.20–5.82)
RDW: 18.2 % — ABNORMAL HIGH (ref 11.0–14.6)
WBC: 5.4 10*3/uL (ref 4.0–10.3)

## 2014-09-11 MED ORDER — DARBEPOETIN ALFA-POLYSORBATE 200 MCG/0.4ML IJ SOLN
200.0000 ug | Freq: Once | INTRAMUSCULAR | Status: AC
  Administered 2014-09-11: 200 ug via SUBCUTANEOUS
  Filled 2014-09-11: qty 0.4

## 2014-09-11 NOTE — Patient Instructions (Signed)
Darbepoetin Alfa injection What is this medicine? DARBEPOETIN ALFA (dar be POE e tin AL fa) helps your body make more red blood cells. It is used to treat anemia caused by chronic kidney failure and chemotherapy. This medicine may be used for other purposes; ask your health care provider or pharmacist if you have questions. COMMON BRAND NAME(S): Aranesp What should I tell my health care provider before I take this medicine? They need to know if you have any of these conditions: -blood clotting disorders or history of blood clots -cancer patient not on chemotherapy -cystic fibrosis -heart disease, such as angina, heart failure, or a history of a heart attack -hemoglobin level of 12 g/dL or greater -high blood pressure -low levels of folate, iron, or vitamin B12 -seizures -an unusual or allergic reaction to darbepoetin, erythropoietin, albumin, hamster proteins, latex, other medicines, foods, dyes, or preservatives -pregnant or trying to get pregnant -breast-feeding How should I use this medicine? This medicine is for injection into a vein or under the skin. It is usually given by a health care professional in a hospital or clinic setting. If you get this medicine at home, you will be taught how to prepare and give this medicine. Do not shake the solution before you withdraw a dose. Use exactly as directed. Take your medicine at regular intervals. Do not take your medicine more often than directed. It is important that you put your used needles and syringes in a special sharps container. Do not put them in a trash can. If you do not have a sharps container, call your pharmacist or healthcare provider to get one. Talk to your pediatrician regarding the use of this medicine in children. While this medicine may be used in children as young as 1 year for selected conditions, precautions do apply. Overdosage: If you think you have taken too much of this medicine contact a poison control center or  emergency room at once. NOTE: This medicine is only for you. Do not share this medicine with others. What if I miss a dose? If you miss a dose, take it as soon as you can. If it is almost time for your next dose, take only that dose. Do not take double or extra doses. What may interact with this medicine? Do not take this medicine with any of the following medications: -epoetin alfa This list may not describe all possible interactions. Give your health care provider a list of all the medicines, herbs, non-prescription drugs, or dietary supplements you use. Also tell them if you smoke, drink alcohol, or use illegal drugs. Some items may interact with your medicine. What should I watch for while using this medicine? Visit your prescriber or health care professional for regular checks on your progress and for the needed blood tests and blood pressure measurements. It is especially important for the doctor to make sure your hemoglobin level is in the desired range, to limit the risk of potential side effects and to give you the best benefit. Keep all appointments for any recommended tests. Check your blood pressure as directed. Ask your doctor what your blood pressure should be and when you should contact him or her. As your body makes more red blood cells, you may need to take iron, folic acid, or vitamin B supplements. Ask your doctor or health care provider which products are right for you. If you have kidney disease continue dietary restrictions, even though this medication can make you feel better. Talk with your doctor or health   care professional about the foods you eat and the vitamins that you take. What side effects may I notice from receiving this medicine? Side effects that you should report to your doctor or health care professional as soon as possible: -allergic reactions like skin rash, itching or hives, swelling of the face, lips, or tongue -breathing problems -changes in vision -chest  pain -confusion, trouble speaking or understanding -feeling faint or lightheaded, falls -high blood pressure -muscle aches or pains -pain, swelling, warmth in the leg -rapid weight gain -severe headaches -sudden numbness or weakness of the face, arm or leg -trouble walking, dizziness, loss of balance or coordination -seizures (convulsions) -swelling of the ankles, feet, hands -unusually weak or tired Side effects that usually do not require medical attention (report to your doctor or health care professional if they continue or are bothersome): -diarrhea -fever, chills (flu-like symptoms) -headaches -nausea, vomiting -redness, stinging, or swelling at site where injected This list may not describe all possible side effects. Call your doctor for medical advice about side effects. You may report side effects to FDA at 1-800-FDA-1088. Where should I keep my medicine? Keep out of the reach of children. Store in a refrigerator between 2 and 8 degrees C (36 and 46 degrees F). Do not freeze. Do not shake. Throw away any unused portion if using a single-dose vial. Throw away any unused medicine after the expiration date. NOTE: This sheet is a summary. It may not cover all possible information. If you have questions about this medicine, talk to your doctor, pharmacist, or health care provider.  2015, Elsevier/Gold Standard. (2008-10-20 10:23:57)  

## 2014-09-18 ENCOUNTER — Other Ambulatory Visit: Payer: Self-pay | Admitting: *Deleted

## 2014-09-22 ENCOUNTER — Other Ambulatory Visit: Payer: Self-pay | Admitting: *Deleted

## 2014-09-22 MED ORDER — HYDROMORPHONE HCL 4 MG PO TABS: 8.0000 mg | ORAL_TABLET | ORAL | Status: DC | PRN

## 2014-09-25 ENCOUNTER — Other Ambulatory Visit: Payer: Self-pay | Admitting: Emergency Medicine

## 2014-09-25 ENCOUNTER — Ambulatory Visit (HOSPITAL_BASED_OUTPATIENT_CLINIC_OR_DEPARTMENT_OTHER): Payer: Medicare Other

## 2014-09-25 ENCOUNTER — Telehealth: Payer: Self-pay | Admitting: Oncology

## 2014-09-25 ENCOUNTER — Ambulatory Visit (HOSPITAL_BASED_OUTPATIENT_CLINIC_OR_DEPARTMENT_OTHER): Payer: Medicare Other | Admitting: Oncology

## 2014-09-25 ENCOUNTER — Other Ambulatory Visit (HOSPITAL_BASED_OUTPATIENT_CLINIC_OR_DEPARTMENT_OTHER): Payer: Medicare Other

## 2014-09-25 VITALS — BP 143/47 | HR 62 | Temp 97.5°F | Resp 20 | Ht 70.0 in | Wt 193.1 lb

## 2014-09-25 DIAGNOSIS — M869 Osteomyelitis, unspecified: Secondary | ICD-10-CM

## 2014-09-25 DIAGNOSIS — D649 Anemia, unspecified: Secondary | ICD-10-CM

## 2014-09-25 DIAGNOSIS — L98499 Non-pressure chronic ulcer of skin of other sites with unspecified severity: Principal | ICD-10-CM

## 2014-09-25 DIAGNOSIS — I70209 Unspecified atherosclerosis of native arteries of extremities, unspecified extremity: Secondary | ICD-10-CM

## 2014-09-25 DIAGNOSIS — C9000 Multiple myeloma not having achieved remission: Secondary | ICD-10-CM

## 2014-09-25 DIAGNOSIS — N289 Disorder of kidney and ureter, unspecified: Secondary | ICD-10-CM

## 2014-09-25 DIAGNOSIS — I739 Peripheral vascular disease, unspecified: Secondary | ICD-10-CM

## 2014-09-25 LAB — COMPREHENSIVE METABOLIC PANEL (CC13)
ALBUMIN: 3.1 g/dL — AB (ref 3.5–5.0)
ALK PHOS: 61 U/L (ref 40–150)
ALT: 17 U/L (ref 0–55)
ANION GAP: 7 meq/L (ref 3–11)
AST: 21 U/L (ref 5–34)
BILIRUBIN TOTAL: 0.43 mg/dL (ref 0.20–1.20)
BUN: 24.8 mg/dL (ref 7.0–26.0)
CO2: 23 mEq/L (ref 22–29)
Calcium: 9.3 mg/dL (ref 8.4–10.4)
Chloride: 106 mEq/L (ref 98–109)
Creatinine: 1.2 mg/dL (ref 0.7–1.3)
GLUCOSE: 98 mg/dL (ref 70–140)
POTASSIUM: 4.7 meq/L (ref 3.5–5.1)
SODIUM: 136 meq/L (ref 136–145)
TOTAL PROTEIN: 6.6 g/dL (ref 6.4–8.3)

## 2014-09-25 LAB — CBC WITH DIFFERENTIAL/PLATELET
BASO%: 0.3 % (ref 0.0–2.0)
Basophils Absolute: 0 10*3/uL (ref 0.0–0.1)
EOS%: 1.6 % (ref 0.0–7.0)
Eosinophils Absolute: 0.1 10*3/uL (ref 0.0–0.5)
HCT: 29.2 % — ABNORMAL LOW (ref 38.4–49.9)
HGB: 9.3 g/dL — ABNORMAL LOW (ref 13.0–17.1)
LYMPH%: 22.9 % (ref 14.0–49.0)
MCH: 28.4 pg (ref 27.2–33.4)
MCHC: 31.8 g/dL — AB (ref 32.0–36.0)
MCV: 89 fL (ref 79.3–98.0)
MONO#: 0.9 10*3/uL (ref 0.1–0.9)
MONO%: 12 % (ref 0.0–14.0)
NEUT#: 4.6 10*3/uL (ref 1.5–6.5)
NEUT%: 63.2 % (ref 39.0–75.0)
PLATELETS: 257 10*3/uL (ref 140–400)
RBC: 3.28 10*6/uL — AB (ref 4.20–5.82)
RDW: 17 % — ABNORMAL HIGH (ref 11.0–14.6)
WBC: 7.3 10*3/uL (ref 4.0–10.3)
lymph#: 1.7 10*3/uL (ref 0.9–3.3)

## 2014-09-25 LAB — PROTEIN / CREATININE RATIO, URINE
Creatinine, Urine: 75.5 mg/dL
PROTEIN CREATININE RATIO: 0.48 — AB (ref <0.15)
Total Protein, Urine: 36 mg/dL — ABNORMAL HIGH (ref 5–25)

## 2014-09-25 MED ORDER — DARBEPOETIN ALFA 200 MCG/0.4ML IJ SOSY
200.0000 ug | PREFILLED_SYRINGE | Freq: Once | INTRAMUSCULAR | Status: AC
  Administered 2014-09-25: 200 ug via SUBCUTANEOUS
  Filled 2014-09-25: qty 0.4

## 2014-09-25 NOTE — Patient Instructions (Signed)
Darbepoetin Alfa injection What is this medicine? DARBEPOETIN ALFA (dar be POE e tin AL fa) helps your body make more red blood cells. It is used to treat anemia caused by chronic kidney failure and chemotherapy. This medicine may be used for other purposes; ask your health care provider or pharmacist if you have questions. COMMON BRAND NAME(S): Aranesp What should I tell my health care provider before I take this medicine? They need to know if you have any of these conditions: -blood clotting disorders or history of blood clots -cancer patient not on chemotherapy -cystic fibrosis -heart disease, such as angina, heart failure, or a history of a heart attack -hemoglobin level of 12 g/dL or greater -high blood pressure -low levels of folate, iron, or vitamin B12 -seizures -an unusual or allergic reaction to darbepoetin, erythropoietin, albumin, hamster proteins, latex, other medicines, foods, dyes, or preservatives -pregnant or trying to get pregnant -breast-feeding How should I use this medicine? This medicine is for injection into a vein or under the skin. It is usually given by a health care professional in a hospital or clinic setting. If you get this medicine at home, you will be taught how to prepare and give this medicine. Do not shake the solution before you withdraw a dose. Use exactly as directed. Take your medicine at regular intervals. Do not take your medicine more often than directed. It is important that you put your used needles and syringes in a special sharps container. Do not put them in a trash can. If you do not have a sharps container, call your pharmacist or healthcare provider to get one. Talk to your pediatrician regarding the use of this medicine in children. While this medicine may be used in children as young as 1 year for selected conditions, precautions do apply. Overdosage: If you think you have taken too much of this medicine contact a poison control center or  emergency room at once. NOTE: This medicine is only for you. Do not share this medicine with others. What if I miss a dose? If you miss a dose, take it as soon as you can. If it is almost time for your next dose, take only that dose. Do not take double or extra doses. What may interact with this medicine? Do not take this medicine with any of the following medications: -epoetin alfa This list may not describe all possible interactions. Give your health care provider a list of all the medicines, herbs, non-prescription drugs, or dietary supplements you use. Also tell them if you smoke, drink alcohol, or use illegal drugs. Some items may interact with your medicine. What should I watch for while using this medicine? Visit your prescriber or health care professional for regular checks on your progress and for the needed blood tests and blood pressure measurements. It is especially important for the doctor to make sure your hemoglobin level is in the desired range, to limit the risk of potential side effects and to give you the best benefit. Keep all appointments for any recommended tests. Check your blood pressure as directed. Ask your doctor what your blood pressure should be and when you should contact him or her. As your body makes more red blood cells, you may need to take iron, folic acid, or vitamin B supplements. Ask your doctor or health care provider which products are right for you. If you have kidney disease continue dietary restrictions, even though this medication can make you feel better. Talk with your doctor or health   care professional about the foods you eat and the vitamins that you take. What side effects may I notice from receiving this medicine? Side effects that you should report to your doctor or health care professional as soon as possible: -allergic reactions like skin rash, itching or hives, swelling of the face, lips, or tongue -breathing problems -changes in vision -chest  pain -confusion, trouble speaking or understanding -feeling faint or lightheaded, falls -high blood pressure -muscle aches or pains -pain, swelling, warmth in the leg -rapid weight gain -severe headaches -sudden numbness or weakness of the face, arm or leg -trouble walking, dizziness, loss of balance or coordination -seizures (convulsions) -swelling of the ankles, feet, hands -unusually weak or tired Side effects that usually do not require medical attention (report to your doctor or health care professional if they continue or are bothersome): -diarrhea -fever, chills (flu-like symptoms) -headaches -nausea, vomiting -redness, stinging, or swelling at site where injected This list may not describe all possible side effects. Call your doctor for medical advice about side effects. You may report side effects to FDA at 1-800-FDA-1088. Where should I keep my medicine? Keep out of the reach of children. Store in a refrigerator between 2 and 8 degrees C (36 and 46 degrees F). Do not freeze. Do not shake. Throw away any unused portion if using a single-dose vial. Throw away any unused medicine after the expiration date. NOTE: This sheet is a summary. It may not cover all possible information. If you have questions about this medicine, talk to your doctor, pharmacist, or health care provider.  2015, Elsevier/Gold Standard. (2008-10-20 10:23:57)  

## 2014-09-25 NOTE — Progress Notes (Signed)
ID: Timothy [name]   DOB: Dec 22, 1927  MR#: [id number]  JEH#:[id number]  PCP:  SU: OTHER MD: Dorna Leitz, Alysia Penna, Georgia Duff, Geryl Councilman  CHIEF COMPLAINT: multiple myeloma CURRENT THERAPY: under observation  MYELOMA HISTORY: Patient presented in April 2007 with symptomatic slowly progressive anemia, with normal iron parameters, folic acid, and vitamin B12. Plans were made to obtain bone marrow aspirate and biopsy as an outpatient, however the patient was hospitalized shortly thereafter with a creatinine of 8.6. Renal biopsy demonstrated the presence of  light chain deposition disease. Bone marrow biopsy demonstrated 37% plasma cells. A skeletal survey showed small calvarial lytic lesions, mild osteopenia, and cervical spondylosis. With a well-established diagnosis of light-chain multiple myeloma Dr. Melodie Bouillon subsequent treatments are as detailed below.   INTERVAL HISTORY: Timothy [name] returns today with his wife, Timothy [name], for followup of his multiple myeloma. Interval history is generally unremarkable. His right hip is still sore but is doing more walking and at home he does not use a cane or walker. Because of the surgery he had to his left foot he has a balance problem. He has been offered an insertby Dr. Valentina Lucks to balance the foot better but so far he has not proceeded with that.   REVIEW OF SYSTEMS: Timothy [name] denies bleeding, fever, or rash. His appetite is good and he is having normal bowel movements. He has a good urine stream. He is fluid restricted, and drinks maximally 1.5 L a day. He can be short of breath even at rest sometimes, but denies chest pain or pressure, cough, or pleurisy. He has significant back pain. He can be forgetful. Overall however he feels he is "making progress". A detailed review of systems today was otherwise stable  PAST MEDICAL HISTORY: Past Medical History  Diagnosis Date  . ASCVD  (arteriosclerotic cardiovascular disease)   . Anemia     chronic mild anemia  . Gout   . Hypertension   . Lumbar disc disease     post laminectomy  . Chronic back pain   . History of echocardiogram 11/02/2010    EF range of 30 to 35% / There is hypokinsesis of the basal-mild inferolateral myocardium  . CHF (congestive heart failure)   . Heart murmur   . Ischemic cardiomyopathy   . Renal insufficiency   . Peripheral neuropathy   . Inferior myocardial infarction 1986  . Hyperlipidemia   . SOB (shortness of breath)   . Fatigue   . Peripheral neuropathy   . Coronary artery disease     CABG 2002 by Dr. Roxy Manns with LIMA to LAD, SVG to intermediate, SVG to LCX, & SVG to PL. Last nuclear in 2012 showing large inferior scar with EF of 39%.   . Blood transfusion     DEC 2012 - TWO UNITS  . Myeloma     Multiple myeloma, with recurrence of increasing problems  DR. Mansfield -ONCOLOGIST.  PT HAS BEEN OFF CHEMO SINCE HIS HOSPITALIZATION FEB 2013 FOR LEFT FOOT INFECTION  . Cancer   . History of shingles MARCH 2009    SHINGLE LESIONS WERE AROUND RIGHT EYE--PT HAS RESIDUAL ITCHING AROUND THE EYE.  . Osteomyelitis     s/p toe amputation  . Chronic systolic heart failure     EF of 30% per echo 02/2012  . Protein malnutrition   . Critical lower limb ischemia    PAST SURGICAL HISTORY: Past Surgical History  Procedure Laterality Date  . Laminectomy    .  Inguinal hernia repair    . Cardiac catheterization  06/20/2000    Severe coronary disease (totally occluded right artery, 90% left circumflex, 50-60% intermediate, and 90-95% ostial left anterior descending)   . Tonsillectomy    . Coronary artery bypass graft  2002    x4 / Left internal mammary artery to the LAD.  / Saphenous vein graft to the right posterolateral.  /  Saphenous vein graft to  the ramus intermedius. / Saphenous vein graft to the circumflex marginal     . Amputation  03/06/2012    Procedure: AMPUTATION RAY;  Surgeon: Harvie Junior, MD;  Location: [facility];  Service: Orthopedics;  Laterality: Left;  First Ray Amputation  . Intramedullary (im) nail intertrochanteric Right 08/17/2013    Procedure: INTRAMEDULLARY (IM) NAIL INTERTROCHANTRIC;  Surgeon: Eldred Manges, MD;  Location: [facility];  Service: Orthopedics;  Laterality: Right;  . Endarterectomy femoral Left 03/24/2014    Procedure: Left Common Femoral  Artery Endarterectomy with Vein Patch Angioplasty,  Ultrasound ;  Surgeon: Chuck Hint, MD;  Location: [facility];  Service: Vascular;  Laterality: Left;  . Toe amputation      FAMILY HISTORY No family history of hematologic malignancies; brother had prostate cancer; no other cancers in the immediate family  SOCIAL HISTORY: Retired International aid/development worker; children from prior marriage. He and his wife Timothy [name] are the only ones at home.    ADVANCED DIRECTIVES:  HEALTH MAINTENANCE: History  Substance Use Topics  . Smoking status: Former Smoker -- 1.00 packs/day for 20 years    Types: Cigarettes    Quit date: 11/20/1984  . Smokeless tobacco: Never Used  . Alcohol Use: No     Colonoscopy:  Bone density:  Lipid panel:  Allergies  Allergen Reactions  . Morphine And Related Other (See Comments)    Agitated with morphine drip    Current Outpatient Prescriptions  Medication Sig Dispense Refill  . allopurinol (ZYLOPRIM) 300 MG tablet Take 0.5 tablets (150 mg total) by mouth daily. 90 tablet 3  . amLODipine (NORVASC) 2.5 MG tablet Take 1 tablet (2.5 mg total) by mouth daily. 30 tablet 3  . aspirin EC 81 MG tablet Take 81 mg by mouth 3 (three) times a week. Monday, Wednesday, Friday    . atorvastatin (LIPITOR) 10 MG tablet Take 1 tablet (10 mg total) by mouth every evening. 90 tablet 3  . B Complex-Biotin-FA (B COMPLETE PO) Take 1 tablet by mouth daily.     . carvedilol (COREG) 6.25 MG tablet Take 1 tablet (6.25 mg total) by mouth 2 (two) times daily with a meal. 180 tablet 3  . Coenzyme Q10 (CO Q-10 PO) Take 1 tablet  by mouth every morning.     . darbepoetin (ARANESP) 200 MCG/0.4ML SOLN injection Inject 200 mcg into the skin as directed.    Marland Kitchen HYDROmorphone (DILAUDID) 4 MG tablet Take 2 tablets (8 mg total) by mouth every 4 (four) hours as needed for severe pain. 120 tablet 0  . lisinopril (PRINIVIL,ZESTRIL) 20 MG tablet Take 20 mg by mouth daily.    . Multiple Vitamin (MULTIVITAMIN) capsule Take 1 capsule by mouth every morning.     . nitroGLYCERIN (NITROSTAT) 0.4 MG SL tablet Place 1 tablet (0.4 mg total) under the tongue every 5 (five) minutes as needed for chest pain. 25 tablet 3  . polyethylene glycol (MIRALAX / GLYCOLAX) packet Take 17 g by mouth daily as needed. 14 each 0  . rOPINIRole (REQUIP) 3 MG tablet Take  3 mg by mouth at bedtime.     . tamsulosin (FLOMAX) 0.4 MG CAPS capsule Take 0.4 mg by mouth at bedtime.    Marland Kitchen tobramycin (TOBREX) 0.3 % ophthalmic solution Place 1 drop into both eyes daily as needed (for Bacterial eye infection).     . vitamin C (ASCORBIC ACID) 500 MG tablet Take 500 mg by mouth every morning.     No current facility-administered medications for this visit.    Objective: Older white man who appears stated age  42 Vitals:   09/25/14 1012  BP: 143/47  Pulse: 62  Temp: 97.5 F (36.4 C)  Resp: 20        Body mass index is 27.71 kg/(m^2).    ECOG FS: 2 Filed Weights   09/25/14 1012  Weight: 193 lb 1.6 oz (87.59 kg)   Sclerae unicteric, pupils equal and reactive Oropharynx clear, dentition in good repair No cervical or supraclavicular adenopathy Lungs no rales or rhonchi Heart regular rate and rhythm Abd soft, nontender, positive bowel sounds MSK no focal spinal tenderness, minimal bilateral ankle edema Neuro: nonfocal, well oriented, appropriate affect  LAB RESULTS:  Results for NOEH, SPARACINO (MRN [id number]) as of 09/27/2014 13:32  Ref. Range 05/08/2014 14:14 06/05/2014 13:39 07/03/2014 10:53 07/31/2014 10:16 08/28/2014 13:16  Kappa free light chain Latest  Range: 0.33-1.94 mg/dL 59.40 (H) 59.50 (H) 61.00 (H) 77.50 (H) 78.60 (H)     Lab Results  Component Value Date   WBC 7.3 09/25/2014   NEUTROABS 4.6 09/25/2014   HGB 9.3* 09/25/2014   HCT 29.2* 09/25/2014   MCV 89.0 09/25/2014   PLT 257 09/25/2014      Chemistry      Component Value Date/Time   NA 136 09/25/2014 0948   NA 126* 08/11/2014 1052   K 4.7 09/25/2014 0948   K 4.0 08/11/2014 1052   CL 96 08/11/2014 1052   CL 98 05/12/2013 1048   CO2 23 09/25/2014 0948   CO2 24 08/11/2014 1052   BUN 24.8 09/25/2014 0948   BUN 12 08/11/2014 1052   CREATININE 1.2 09/25/2014 0948   CREATININE 1.0 08/11/2014 1052      Component Value Date/Time   CALCIUM 9.3 09/25/2014 0948   CALCIUM 9.0 08/11/2014 1052   ALKPHOS 61 09/25/2014 0948   ALKPHOS 42 03/25/2014 2115   AST 21 09/25/2014 0948   AST 25 03/25/2014 2115   ALT 17 09/25/2014 0948   ALT 10 03/25/2014 2115   BILITOT 0.43 09/25/2014 0948   BILITOT 0.7 03/25/2014 2115     Results for TEEJAY, MEADER (MRN [id number]) as of 07/31/2014 10:31  Ref. Range 05/21/2014 13:49 06/05/2014 13:34 06/19/2014 13:28 07/03/2014 10:52 07/31/2014 10:16  Retic Ct Abs Latest Range: 34.80-93.90 10e3/uL 112.49 (H) 75.70 99.02 (H) 86.86 92.40   STUDIES: No results found.   ASSESSMENT: 80 y.o.  with kappa light chain multiple myeloma   (1) presenting April 2007 with anemia and renal failure; renal Bx showing free kappa light chain deposition; bone marrow biopsy showing 37% plasma cells, with normal cytogenetics and FISH;bone survey showing skull lytic lesions; treated with   (2) thalidomide (200 mg/d) and dexamethasone (40 mg/d x4d Q28d) June through Nov 2007, with good response, but poor tolerance;   (3) thalidomide decreased to 100 mg/d, dexamethasone continued, bortezomib (IV) added, Dec 2007 to March 2008   (4) treatment interrupted by multiple complications (peripheral neuropathy, pulmonary embolism, diverticular abscess, CN V zoster, severe DDD,  congestive heart failure, rising PSA); maintenance  zolendronic acid through March 2012   (5) progression April 2012, treated initially with dexamethasone alone, poorly tolerated;   (6) bortezomib (sq) resumed July 2012; cyclophosphamide and dexamethasone added Sept 2012; zolendronic acid changed to Q 31months; all treatments held as of March 2013 due to the development of the Left foot ulcer described above  (7) anemia, on aranesp December 2012 to August 2013; resumed Q14d starting May 2015, with slowly rising reticulocyte count  (8) status post Right trochanteric nail with proximal and distal interlock DePuy 11 mm one-pin lag screw, 44 mm distal interlock 08/09/2013 for a right comminuted intertrochanteric fracture  (9) status post removal of the first 2 toes left foot  (10) Left common femoral artery endarterectomy with vein patch angioplasty using left greater saphenous vein 03/24/2014  (11) status post fall with injury to the right hip, large hematoma, requiring [facility] until 07/30/2014  PLAN:   We again went over his myeloma numbers. These are stable as compared to a month ago, although there has been a very slight increase as compared to several months ago. Given the difficulty he had with treatment before and the large number of comorbid issues at present, I think the better part of valor with Timothy [name] is tocontinue close observation. We are obtaini lab work on a monthly basis. If there is a significant trendupward we have many treatment options.  We are continuing the Aranesp every 2 weeks and will follow his reticulocyte count and ferritin on a monthly basis as well.  Timothy [name] is a good understanding of the overall plan. He agrees with it. He will call with any problems that may develop before the next visit here.   MAGRINAT,GUSTAV C    09/25/2014

## 2014-09-25 NOTE — Telephone Encounter (Signed)
, °

## 2014-09-28 ENCOUNTER — Other Ambulatory Visit: Payer: Self-pay | Admitting: *Deleted

## 2014-09-28 ENCOUNTER — Encounter: Payer: Self-pay | Admitting: Oncology

## 2014-09-28 ENCOUNTER — Encounter: Payer: Self-pay | Admitting: Nurse Practitioner

## 2014-09-28 LAB — KAPPA/LAMBDA LIGHT CHAINS
KAPPA LAMBDA RATIO: 49.63 — AB (ref 0.26–1.65)
Kappa free light chain: 135 mg/dL — ABNORMAL HIGH (ref 0.33–1.94)
Lambda Free Lght Chn: 2.72 mg/dL — ABNORMAL HIGH (ref 0.57–2.63)

## 2014-09-28 MED ORDER — AMLODIPINE BESYLATE 2.5 MG PO TABS: 2.5000 mg | ORAL_TABLET | Freq: Every day | ORAL | Status: DC

## 2014-09-28 NOTE — Addendum Note (Signed)
Addended by: Jaci Carrel A on: 09/28/2014 11:30 AM   Modules accepted: Medications

## 2014-10-05 ENCOUNTER — Other Ambulatory Visit: Payer: Self-pay | Admitting: Oncology

## 2014-10-05 DIAGNOSIS — C9000 Multiple myeloma not having achieved remission: Secondary | ICD-10-CM

## 2014-10-05 MED ORDER — ONDANSETRON HCL 8 MG PO TABS: 8.0000 mg | ORAL_TABLET | Freq: Two times a day (BID) | ORAL | Status: DC | PRN

## 2014-10-05 MED ORDER — ACYCLOVIR 400 MG PO TABS: 400.0000 mg | ORAL_TABLET | Freq: Two times a day (BID) | ORAL | Status: DC

## 2014-10-05 NOTE — Progress Notes (Unsigned)
I called date with the latest lab results which unfortunately show a clear progression of his multiple myeloma. We're going to have to resume treatment. He has had so many problems from neuropathy are really don't think were going to be able to use bortezomib or thalidomide-like agents. We are going to go back to Cytoxan and dexamethasone and see if he responds to that. Otherwise we may consider the newer agents, just approved. We will be following his lab work every 28 days and the first treatment will be November 20. We will give treat every 2 weeks instead of weekly and we are omitting the bortezomib.

## 2014-10-07 ENCOUNTER — Telehealth: Payer: Self-pay | Admitting: Oncology

## 2014-10-07 ENCOUNTER — Ambulatory Visit: Payer: Medicare Other | Admitting: Vascular Surgery

## 2014-10-07 ENCOUNTER — Encounter (HOSPITAL_COMMUNITY): Payer: Medicare Other

## 2014-10-07 ENCOUNTER — Telehealth: Payer: Self-pay | Admitting: *Deleted

## 2014-10-07 NOTE — Telephone Encounter (Signed)
Per staff message and POF I have scheduled appts. Advised scheduler of appts. JMW  

## 2014-10-07 NOTE — Telephone Encounter (Signed)
per pof to sch pt appt-sent MW email to sch pt trmt-will call pt once reply °

## 2014-10-07 NOTE — Telephone Encounter (Signed)
cld & spoke to pt and gave pt appt times & date-pt will get update copy on 11/20

## 2014-10-08 ENCOUNTER — Encounter: Payer: Self-pay | Admitting: Podiatrist

## 2014-10-09 ENCOUNTER — Ambulatory Visit: Payer: Medicare Other

## 2014-10-09 ENCOUNTER — Other Ambulatory Visit (HOSPITAL_BASED_OUTPATIENT_CLINIC_OR_DEPARTMENT_OTHER): Payer: Medicare Other

## 2014-10-09 ENCOUNTER — Other Ambulatory Visit: Payer: Medicare Other

## 2014-10-09 ENCOUNTER — Ambulatory Visit: Payer: Medicare Other | Admitting: Oncology

## 2014-10-09 ENCOUNTER — Ambulatory Visit (HOSPITAL_BASED_OUTPATIENT_CLINIC_OR_DEPARTMENT_OTHER): Payer: Medicare Other | Admitting: Oncology

## 2014-10-09 ENCOUNTER — Ambulatory Visit (HOSPITAL_BASED_OUTPATIENT_CLINIC_OR_DEPARTMENT_OTHER): Payer: Medicare Other

## 2014-10-09 VITALS — BP 148/52 | HR 61 | Temp 98.1°F | Resp 18 | Ht 70.0 in | Wt 192.1 lb

## 2014-10-09 DIAGNOSIS — I739 Peripheral vascular disease, unspecified: Secondary | ICD-10-CM

## 2014-10-09 DIAGNOSIS — I70209 Unspecified atherosclerosis of native arteries of extremities, unspecified extremity: Secondary | ICD-10-CM

## 2014-10-09 DIAGNOSIS — N289 Disorder of kidney and ureter, unspecified: Secondary | ICD-10-CM

## 2014-10-09 DIAGNOSIS — M869 Osteomyelitis, unspecified: Secondary | ICD-10-CM

## 2014-10-09 DIAGNOSIS — C9000 Multiple myeloma not having achieved remission: Secondary | ICD-10-CM

## 2014-10-09 DIAGNOSIS — D649 Anemia, unspecified: Secondary | ICD-10-CM

## 2014-10-09 DIAGNOSIS — L98499 Non-pressure chronic ulcer of skin of other sites with unspecified severity: Secondary | ICD-10-CM

## 2014-10-09 DIAGNOSIS — E222 Syndrome of inappropriate secretion of antidiuretic hormone: Secondary | ICD-10-CM

## 2014-10-09 DIAGNOSIS — M8580 Other specified disorders of bone density and structure, unspecified site: Secondary | ICD-10-CM

## 2014-10-09 DIAGNOSIS — G629 Polyneuropathy, unspecified: Secondary | ICD-10-CM

## 2014-10-09 DIAGNOSIS — Z5112 Encounter for antineoplastic immunotherapy: Secondary | ICD-10-CM

## 2014-10-09 DIAGNOSIS — D630 Anemia in neoplastic disease: Secondary | ICD-10-CM

## 2014-10-09 LAB — CBC & DIFF AND RETIC
BASO%: 0.5 % (ref 0.0–2.0)
Basophils Absolute: 0 10*3/uL (ref 0.0–0.1)
EOS ABS: 0.2 10*3/uL (ref 0.0–0.5)
EOS%: 2.6 % (ref 0.0–7.0)
HEMATOCRIT: 30.8 % — AB (ref 38.4–49.9)
HEMOGLOBIN: 9.6 g/dL — AB (ref 13.0–17.1)
Immature Retic Fract: 11.2 % — ABNORMAL HIGH (ref 3.00–10.60)
LYMPH%: 30.2 % (ref 14.0–49.0)
MCH: 27.7 pg (ref 27.2–33.4)
MCHC: 31.2 g/dL — ABNORMAL LOW (ref 32.0–36.0)
MCV: 88.8 fL (ref 79.3–98.0)
MONO#: 0.6 10*3/uL (ref 0.1–0.9)
MONO%: 9.4 % (ref 0.0–14.0)
NEUT%: 57.3 % (ref 39.0–75.0)
NEUTROS ABS: 3.5 10*3/uL (ref 1.5–6.5)
Platelets: 259 10*3/uL (ref 140–400)
RBC: 3.47 10*6/uL — ABNORMAL LOW (ref 4.20–5.82)
RDW: 17.1 % — ABNORMAL HIGH (ref 11.0–14.6)
Retic %: 2.03 % — ABNORMAL HIGH (ref 0.80–1.80)
Retic Ct Abs: 70.44 10*3/uL (ref 34.80–93.90)
WBC: 6.2 10*3/uL (ref 4.0–10.3)
lymph#: 1.9 10*3/uL (ref 0.9–3.3)

## 2014-10-09 LAB — FERRITIN CHCC: Ferritin: 126 ng/ml (ref 22–316)

## 2014-10-09 MED ORDER — DEXAMETHASONE SODIUM PHOSPHATE 20 MG/5ML IJ SOLN
40.0000 mg | Freq: Once | INTRAMUSCULAR | Status: AC
  Administered 2014-10-09: 40 mg via INTRAVENOUS

## 2014-10-09 MED ORDER — DARBEPOETIN ALFA 200 MCG/0.4ML IJ SOSY
200.0000 ug | PREFILLED_SYRINGE | Freq: Once | INTRAMUSCULAR | Status: AC
  Administered 2014-10-09: 200 ug via SUBCUTANEOUS
  Filled 2014-10-09: qty 0.4

## 2014-10-09 MED ORDER — SODIUM CHLORIDE 0.9 % IV SOLN
300.0000 mg/m2 | Freq: Once | INTRAVENOUS | Status: AC
  Administered 2014-10-09: 620 mg via INTRAVENOUS
  Filled 2014-10-09: qty 31

## 2014-10-09 MED ORDER — DEXAMETHASONE 4 MG PO TABS: ORAL_TABLET | ORAL | Status: DC

## 2014-10-09 MED ORDER — SODIUM CHLORIDE 0.9 % IV SOLN
Freq: Once | INTRAVENOUS | Status: AC
  Administered 2014-10-09: 13:00:00 via INTRAVENOUS

## 2014-10-09 MED ORDER — DEXAMETHASONE SODIUM PHOSPHATE 20 MG/5ML IJ SOLN
INTRAMUSCULAR | Status: AC
  Filled 2014-10-09: qty 10

## 2014-10-09 MED ORDER — ONDANSETRON 8 MG/NS 50 ML IVPB
INTRAVENOUS | Status: AC
  Filled 2014-10-09: qty 8

## 2014-10-09 MED ORDER — ONDANSETRON 8 MG/50ML IVPB (CHCC)
8.0000 mg | Freq: Once | INTRAVENOUS | Status: AC
  Administered 2014-10-09: 8 mg via INTRAVENOUS

## 2014-10-09 MED ORDER — DIPHENOXYLATE-ATROPINE 2.5-0.025 MG PO TABS: 1.0000 | ORAL_TABLET | Freq: Four times a day (QID) | ORAL | Status: DC | PRN

## 2014-10-09 NOTE — Patient Instructions (Signed)
[facility] Discharge Instructions for Patients Receiving Chemotherapy  Today you received the following chemotherapy agents Cytoxan  To help prevent nausea and vomiting after your treatment, we encourage you to take your nausea medication     If you develop nausea and vomiting that is not controlled by your nausea medication, call the clinic.   BELOW ARE SYMPTOMS THAT SHOULD BE REPORTED IMMEDIATELY:  *FEVER GREATER THAN 100.5 F  *CHILLS WITH OR WITHOUT FEVER  NAUSEA AND VOMITING THAT IS NOT CONTROLLED WITH YOUR NAUSEA MEDICATION  *UNUSUAL SHORTNESS OF BREATH  *UNUSUAL BRUISING OR BLEEDING  TENDERNESS IN MOUTH AND THROAT WITH OR WITHOUT PRESENCE OF ULCERS  *URINARY PROBLEMS  *BOWEL PROBLEMS  UNUSUAL RASH Items with * indicate a potential emergency and should be followed up as soon as possible.  Feel free to call the clinic you have any questions or concerns. The clinic phone number is [phone].  Cyclophosphamide injection What is this medicine? CYCLOPHOSPHAMIDE (sye kloe FOSS fa mide) is a chemotherapy drug. It slows the growth of cancer cells. This medicine is used to treat many types of cancer like lymphoma, myeloma, leukemia, breast cancer, and ovarian cancer, to name a few. This medicine may be used for other purposes; ask your health care provider or pharmacist if you have questions. COMMON BRAND NAME(S): Cytoxan, Neosar What should I tell my health care provider before I take this medicine? They need to know if you have any of these conditions: -blood disorders -history of other chemotherapy -infection -kidney disease -liver disease -recent or ongoing radiation therapy -tumors in the bone marrow -an unusual or allergic reaction to cyclophosphamide, other chemotherapy, other medicines, foods, dyes, or preservatives -pregnant or trying to get pregnant -breast-feeding How should I use this medicine? This drug is usually given as an injection  into a vein or muscle or by infusion into a vein. It is administered in a hospital or clinic by a specially trained health care professional. Talk to your pediatrician regarding the use of this medicine in children. Special care may be needed. Overdosage: If you think you have taken too much of this medicine contact a poison control center or emergency room at once. NOTE: This medicine is only for you. Do not share this medicine with others. What if I miss a dose? It is important not to miss your dose. Call your doctor or health care professional if you are unable to keep an appointment. What may interact with this medicine? This medicine may interact with the following medications: -amiodarone -amphotericin B -azathioprine -certain antiviral medicines for HIV or AIDS such as protease inhibitors (e.g., indinavir, ritonavir) and zidovudine -certain blood pressure medications such as benazepril, captopril, enalapril, fosinopril, lisinopril, moexipril, monopril, perindopril, quinapril, ramipril, trandolapril -certain cancer medications such as anthracyclines (e.g., daunorubicin, doxorubicin), busulfan, cytarabine, paclitaxel, pentostatin, tamoxifen, trastuzumab -certain diuretics such as chlorothiazide, chlorthalidone, hydrochlorothiazide, indapamide, metolazone -certain medicines that treat or prevent blood clots like warfarin -certain muscle relaxants such as succinylcholine -cyclosporine -etanercept -indomethacin -medicines to increase blood counts like filgrastim, pegfilgrastim, sargramostim -medicines used as general anesthesia -metronidazole -natalizumab This list may not describe all possible interactions. Give your health care provider a list of all the medicines, herbs, non-prescription drugs, or dietary supplements you use. Also tell them if you smoke, drink alcohol, or use illegal drugs. Some items may interact with your medicine. What should I watch for while using this  medicine? Visit your doctor for checks on your progress. This drug may make you feel  generally unwell. This is not uncommon, as chemotherapy can affect healthy cells as well as cancer cells. Report any side effects. Continue your course of treatment even though you feel ill unless your doctor tells you to stop. Drink water or other fluids as directed. Urinate often, even at night. In some cases, you may be given additional medicines to help with side effects. Follow all directions for their use. Call your doctor or health care professional for advice if you get a fever, chills or sore throat, or other symptoms of a cold or flu. Do not treat yourself. This drug decreases your body's ability to fight infections. Try to avoid being around people who are sick. This medicine may increase your risk to bruise or bleed. Call your doctor or health care professional if you notice any unusual bleeding. Be careful brushing and flossing your teeth or using a toothpick because you may get an infection or bleed more easily. If you have any dental work done, tell your dentist you are receiving this medicine. You may get drowsy or dizzy. Do not drive, use machinery, or do anything that needs mental alertness until you know how this medicine affects you. Do not become pregnant while taking this medicine or for 1 year after stopping it. Women should inform their doctor if they wish to become pregnant or think they might be pregnant. Men should not father a child while taking this medicine and for 4 months after stopping it. There is a potential for serious side effects to an unborn child. Talk to your health care professional or pharmacist for more information. Do not breast-feed an infant while taking this medicine. This medicine may interfere with the ability to have a child. This medicine has caused ovarian failure in some women. This medicine has caused reduced sperm counts in some men. You should talk with your doctor or  health care professional if you are concerned about your fertility. If you are going to have surgery, tell your doctor or health care professional that you have taken this medicine. What side effects may I notice from receiving this medicine? Side effects that you should report to your doctor or health care professional as soon as possible: -allergic reactions like skin rash, itching or hives, swelling of the face, lips, or tongue -low blood counts - this medicine may decrease the number of white blood cells, red blood cells and platelets. You may be at increased risk for infections and bleeding. -signs of infection - fever or chills, cough, sore throat, pain or difficulty passing urine -signs of decreased platelets or bleeding - bruising, pinpoint red spots on the skin, black, tarry stools, blood in the urine -signs of decreased red blood cells - unusually weak or tired, fainting spells, lightheadedness -breathing problems -dark urine -dizziness -palpitations -swelling of the ankles, feet, hands -trouble passing urine or change in the amount of urine -weight gain -yellowing of the eyes or skin Side effects that usually do not require medical attention (report to your doctor or health care professional if they continue or are bothersome): -changes in nail or skin color -hair loss -missed menstrual periods -mouth sores -nausea, vomiting This list may not describe all possible side effects. Call your doctor for medical advice about side effects. You may report side effects to FDA at 1-800-FDA-1088. Where should I keep my medicine? This drug is given in a hospital or clinic and will not be stored at home. NOTE: This sheet is a summary. It may not cover  all possible information. If you have questions about this medicine, talk to your doctor, pharmacist, or health care provider.  2015, Elsevier/Gold Standard. (2012-09-20 16:22:58)

## 2014-10-10 ENCOUNTER — Ambulatory Visit: Payer: Medicare Other

## 2014-10-10 DIAGNOSIS — E222 Syndrome of inappropriate secretion of antidiuretic hormone: Secondary | ICD-10-CM | POA: Insufficient documentation

## 2014-10-10 NOTE — Progress Notes (Signed)
ID: Timothy [name]   DOB: 10/25/1928  MR#: [id number]  XIP#:[id number]  PCP:  [facility]: OTHER MD: Dorna Leitz, Alysia Penna, Georgia Duff, Geryl Councilman  CHIEF COMPLAINT: multiple myeloma CURRENT THERAPY: cyclophosphamide, dexamethasone  MYELOMA HISTORY: From the earlier summary Patient presented in April 2007 with symptomatic slowly progressive anemia, with normal iron parameters, folic acid, and vitamin B12. Plans were made to obtain bone marrow aspirate and biopsy as an outpatient, however the patient was hospitalized shortly thereafter with a creatinine of 8.6. Renal biopsy demonstrated the presence of  light chain deposition disease. Bone marrow biopsy demonstrated 37% plasma cells. A skeletal survey showed small calvarial lytic lesions, mild osteopenia, and cervical spondylosis. With a well-established diagnosis of light-chain multiple myeloma Dr. Melodie Bouillon subsequent treatments are as detailed below.   INTERVAL HISTORY: Timothy [name] returns today with his wife, Timothy [name], for resumption of treatment of his multiple myeloma. The most recent lab work showed a significant increase in his light chains. We discussed this at length today. He understands these light chains are nephrotoxic and if we do not intervene he is likely to end up on dialysis.  REVIEW OF SYSTEMS: Timothy [name] is doing somewhat better as far as his functional status is concerned. He tells me has been mowing the yard, feeding the birds, and overseeing some gardening planting. Timothy [name] tells me that an amazingly and establishing Timothy [name] is actually helping with clearance of the table and other housework. He has had no fevers, no bleeding, and no intercurrent infections. He has had some swelling in his left groin area related to his prior procedure there and he wanted me to examine that today. He continues on 1.5 quart a day fluid restriction for his SIADH. A detailed review of systems  today was otherwise stable.  PAST MEDICAL HISTORY: Past Medical History  Diagnosis Date  . ASCVD (arteriosclerotic cardiovascular disease)   . Anemia     chronic mild anemia  . Gout   . Hypertension   . Lumbar disc disease     post laminectomy  . Chronic back pain   . History of echocardiogram 11/02/2010    EF range of 30 to 35% / There is hypokinsesis of the basal-mild inferolateral myocardium  . CHF (congestive heart failure)   . Heart murmur   . Ischemic cardiomyopathy   . Renal insufficiency   . Peripheral neuropathy   . Inferior myocardial infarction 1986  . Hyperlipidemia   . SOB (shortness of breath)   . Fatigue   . Peripheral neuropathy   . Coronary artery disease     CABG 2002 by Dr. Roxy Manns with LIMA to LAD, SVG to intermediate, SVG to LCX, & SVG to PL. Last nuclear in 2012 showing large inferior scar with EF of 39%.   . Blood transfusion     DEC 2012 - TWO UNITS  . Myeloma     Multiple myeloma, with recurrence of increasing problems  DR. Morrison -ONCOLOGIST.  PT HAS BEEN OFF CHEMO SINCE HIS HOSPITALIZATION FEB 2013 FOR LEFT FOOT INFECTION  . Cancer   . History of shingles MARCH 2009    SHINGLE LESIONS WERE AROUND RIGHT EYE--PT HAS RESIDUAL ITCHING AROUND THE EYE.  . Osteomyelitis     s/p toe amputation  . Chronic systolic heart failure     EF of 30% per echo 02/2012  . Protein malnutrition   . Critical lower limb ischemia    PAST SURGICAL HISTORY: Past Surgical History  Procedure Laterality Date  . Laminectomy    . Inguinal hernia repair    . Cardiac catheterization  06/20/2000    Severe coronary disease (totally occluded right artery, 90% left circumflex, 50-60% intermediate, and 90-95% ostial left anterior descending)   . Tonsillectomy    . Coronary artery bypass graft  2002    x4 / Left internal mammary artery to the LAD.  / Saphenous vein graft to the right posterolateral.  /  Saphenous vein graft to  the ramus intermedius. / Saphenous vein graft to the  circumflex marginal     . Amputation  03/06/2012    Procedure: AMPUTATION RAY;  Surgeon: Harvie Junior, MD;  Location: [facility];  Service: Orthopedics;  Laterality: Left;  First Ray Amputation  . Intramedullary (im) nail intertrochanteric Right 08/17/2013    Procedure: INTRAMEDULLARY (IM) NAIL INTERTROCHANTRIC;  Surgeon: Eldred Manges, MD;  Location: [facility];  Service: Orthopedics;  Laterality: Right;  . Endarterectomy femoral Left 03/24/2014    Procedure: Left Common Femoral  Artery Endarterectomy with Vein Patch Angioplasty,  Ultrasound ;  Surgeon: Chuck Hint, MD;  Location: [facility] OR;  Service: Vascular;  Laterality: Left;  . Toe amputation      FAMILY HISTORY No family history of hematologic malignancies; brother had prostate cancer; no other cancers in the immediate family  SOCIAL HISTORY: Retired International aid/development worker; children from prior marriage. He and his wife Timothy [name] are the only ones at home.    ADVANCED DIRECTIVES:   HEALTH MAINTENANCE: History  Substance Use Topics  . Smoking status: Former Smoker -- 1.00 packs/day for 20 years    Types: Cigarettes    Quit date: 11/20/1984  . Smokeless tobacco: Never Used  . Alcohol Use: No     Colonoscopy:  Bone density:  Lipid panel:  Allergies  Allergen Reactions  . Morphine And Related Other (See Comments)    Agitated with morphine drip    Current Outpatient Prescriptions  Medication Sig Dispense Refill  . acyclovir (ZOVIRAX) 400 MG tablet Take 1 tablet (400 mg total) by mouth 2 (two) times daily. 60 tablet 3  . allopurinol (ZYLOPRIM) 300 MG tablet Take 0.5 tablets (150 mg total) by mouth daily. 90 tablet 3  . amLODipine (NORVASC) 2.5 MG tablet Take 1 tablet (2.5 mg total) by mouth daily. 90 tablet 3  . aspirin EC 81 MG tablet Take 81 mg by mouth 3 (three) times a week. Monday, Wednesday, Friday    . atorvastatin (LIPITOR) 10 MG tablet Take 1 tablet (10 mg total) by mouth every evening. 90 tablet 3  . B Complex-Biotin-FA (B  COMPLETE PO) Take 1 tablet by mouth daily.     . carvedilol (COREG) 6.25 MG tablet Take 1 tablet (6.25 mg total) by mouth 2 (two) times daily with a meal. 180 tablet 3  . Coenzyme Q10 (CO Q-10 PO) Take 1 tablet by mouth every morning.     . darbepoetin (ARANESP) 200 MCG/0.4ML SOLN injection Inject 200 mcg into the skin as directed.    Marland Kitchen dexamethasone (DECADRON) 4 MG tablet Take five tablets 2 consecutive days every week 40 tablet 6  . diphenoxylate-atropine (LOMOTIL) 2.5-0.025 MG per tablet Take 1 tablet by mouth 4 (four) times daily as needed for diarrhea or loose stools. 30 tablet 0  . HYDROmorphone (DILAUDID) 4 MG tablet Take 2 tablets (8 mg total) by mouth every 4 (four) hours as needed for severe pain. 120 tablet 0  . lisinopril (PRINIVIL,ZESTRIL) 20 MG tablet Take 20  mg by mouth daily.    . Multiple Vitamin (MULTIVITAMIN) capsule Take 1 capsule by mouth every morning.     . nitroGLYCERIN (NITROSTAT) 0.4 MG SL tablet Place 1 tablet (0.4 mg total) under the tongue every 5 (five) minutes as needed for chest pain. 25 tablet 3  . ondansetron (ZOFRAN) 8 MG tablet Take 1 tablet (8 mg total) by mouth 2 (two) times daily as needed (Nausea or vomiting). 30 tablet 1  . polyethylene glycol (MIRALAX / GLYCOLAX) packet Take 17 g by mouth daily as needed. 14 each 0  . rOPINIRole (REQUIP) 3 MG tablet Take 3 mg by mouth at bedtime.     . tamsulosin (FLOMAX) 0.4 MG CAPS capsule Take 0.4 mg by mouth at bedtime.    Marland Kitchen tobramycin (TOBREX) 0.3 % ophthalmic solution Place 1 drop into both eyes daily as needed (for Bacterial eye infection).     . vitamin C (ASCORBIC ACID) 500 MG tablet Take 500 mg by mouth every morning.     No current facility-administered medications for this visit.    Objective: Older white man in no acute distress Filed Vitals:   10/09/14 1023  BP: 148/52  Pulse: 61  Temp: 98.1 F (36.7 C)  Resp: 18        Body mass index is 27.56 kg/(m^2).    ECOG FS: 2 Filed Weights   10/09/14  1023  Weight: 192 lb 1.6 oz (87.136 kg)   Sclerae unicteric, EOMs intact Oropharynx clear and moist, no thrush or other lesions No cervical or supraclavicular adenopathy Lungs no rales or rhonchi Heart regular rate and rhythm Abd soft, nontender, positive bowel sounds; left groin area shows some scar tissue from his vascular procedure, but no erythema and no swelling. MSK no focal spinal tenderness Neuro: nonfocal, well oriented, appropriate affect  LAB RESULTS:  Results for HAYK, DIVIS (MRN [id number]) as of 10/10/2014 09:29  Ref. Range 06/05/2014 13:39 07/03/2014 10:53 07/31/2014 10:16 08/28/2014 13:16 09/25/2014 09:48  Kappa free light chain Latest Range: 0.33-1.94 mg/dL 59.50 (H) 61.00 (H) 77.50 (H) 78.60 (H) 135.00 (H)  Lambda Free Lght Chn Latest Range: 0.57-2.63 mg/dL 2.43 2.34 2.64 (H) 2.89 (H) 2.72 (H)  Kappa:Lambda Ratio Latest Range: 0.26-1.65  24.49 (H) 26.07 (H) 29.36 (H) 27.20 (H) 49.63 (H)    Lab Results  Component Value Date   WBC 6.2 10/09/2014   NEUTROABS 3.5 10/09/2014   HGB 9.6* 10/09/2014   HCT 30.8* 10/09/2014   MCV 88.8 10/09/2014   PLT 259 10/09/2014      Chemistry      Component Value Date/Time   NA 136 09/25/2014 0948   NA 126* 08/11/2014 1052   K 4.7 09/25/2014 0948   K 4.0 08/11/2014 1052   CL 96 08/11/2014 1052   CL 98 05/12/2013 1048   CO2 23 09/25/2014 0948   CO2 24 08/11/2014 1052   BUN 24.8 09/25/2014 0948   BUN 12 08/11/2014 1052   CREATININE 1.2 09/25/2014 0948   CREATININE 1.0 08/11/2014 1052      Component Value Date/Time   CALCIUM 9.3 09/25/2014 0948   CALCIUM 9.0 08/11/2014 1052   ALKPHOS 61 09/25/2014 0948   ALKPHOS 42 03/25/2014 2115   AST 21 09/25/2014 0948   AST 25 03/25/2014 2115   ALT 17 09/25/2014 0948   ALT 10 03/25/2014 2115   BILITOT 0.43 09/25/2014 0948   BILITOT 0.7 03/25/2014 2115     Results for BRYNDEN, THUNE (MRN [id number]) as of 10/10/2014  09:29  Ref. Range 10/02/2006 14:11 01/01/2007 12:38 03/12/2007  10:00 04/30/2007 12:32 05/14/2007 13:00  RETIC # Latest Range: 31.8-103.9 10e3/uL 49.0 121.9 (H) 45.1 30.1 (L) 76.5   STUDIES: No results found.   ASSESSMENT: 80 y.o.  with kappa light chain multiple myeloma   (1) presenting April 2007 with anemia and renal failure; renal Bx showing free kappa light chain deposition; bone marrow biopsy showing 37% plasma cells, with normal cytogenetics and FISH;bone survey showing skull lytic lesions; treated with   (2) thalidomide (200 mg/d) and dexamethasone (40 mg/d x4d Q28d) June through Nov 2007, with good response, but poor tolerance;   (3) thalidomide decreased to 100 mg/d, dexamethasone continued, bortezomib (IV) added, Dec 2007 to March 2008   (4) treatment interrupted by multiple complications (peripheral neuropathy, pulmonary embolism, diverticular abscess, CN V zoster, severe DDD, congestive heart failure, rising PSA); maintenance zolendronic acid through March 2012   (5) progression April 2012, treated initially with dexamethasone alone, poorly tolerated;   (6) bortezomib (sq) resumed July 2012; cyclophosphamide and dexamethasone added Sept 2012; zolendronic acid changed to Q 66months; all treatments held as of March 2013 due to the development of the Left foot ulcer described above  (7) anemia, on aranesp December 2012 to August 2013; resumed Q14d starting May 2015, with slowly rising reticulocyte count  (8) status post Right trochanteric nail with proximal and distal interlock DePuy 11 mm one-pin lag screw, 44 mm distal interlock 08/09/2013 for a right comminuted intertrochanteric fracture  (9) status post removal of the first 2 toes left foot  (10) Left common femoral artery endarterectomy with vein patch angioplasty using left greater saphenous vein 03/24/2014  (11) status post fall with injury to the right hip, large hematoma, requiring rehabilitation at [facility] until 07/30/2014  (12) on 10/09/2014 starting cyclophosphamide at  300 mg/m every 14 days and dexamethasone 20 mg daily 2 days each week  PLAN:   We went over the rationale for resuming treatment at this point. Timothy [name] has had a long treatment break. He is significant peripheral neuropathy unfortunately only minimally improved. This constrains our choice of treatments. He also has multiple comorbidities. The goal here would be to do the minimum necessary to bring his light chains under good control.  Accordingly we are not going to use Velcade. We are going to do the cyclophosphamide every 2 weeks instead of weekly. We are going to do the dexamethasone at 20 mg a day for 2 days every week. This combination, if effective, should be well tolerated and may be continued as long as necessary. In particular a will not worsen his peripheral neuropathy. We will continue to follow his anemia closely and continue Aranesp support.  Timothy [name] will see [place] every 2 weeks for the first few treatments, until we can feel secure in his tolerance and can begin to assess response. He knows to call for any problems that may develop before his next visit here. Kaziyah Parkison C    10/10/2014

## 2014-10-11 ENCOUNTER — Encounter: Payer: Self-pay | Admitting: Oncology

## 2014-10-12 ENCOUNTER — Other Ambulatory Visit: Payer: Self-pay | Admitting: *Deleted

## 2014-10-12 ENCOUNTER — Telehealth: Payer: Self-pay | Admitting: *Deleted

## 2014-10-12 MED ORDER — HYDROMORPHONE HCL 4 MG PO TABS: 4.0000 mg | ORAL_TABLET | ORAL | Status: DC | PRN

## 2014-10-12 MED ORDER — HYDROMORPHONE HCL 4 MG PO TABS: 8.0000 mg | ORAL_TABLET | ORAL | Status: DC | PRN

## 2014-10-12 NOTE — Telephone Encounter (Signed)
No adverse effect from Cytoxan-eating and drinking well. Made him aware that Val will reprint his scripts for Dilaudid and call when ready. She will also discuss the acyclovir with them. He was appreciative of the update.

## 2014-10-13 LAB — PROTEIN ELECTROPHORESIS, SERUM
ALBUMIN ELP: 48.3 % — AB (ref 55.8–66.1)
ALPHA-1-GLOBULIN: 5.9 % — AB (ref 2.9–4.9)
Alpha-2-Globulin: 12.6 % — ABNORMAL HIGH (ref 7.1–11.8)
BETA 2: 5.8 % (ref 3.2–6.5)
Beta Globulin: 6.1 % (ref 4.7–7.2)
Gamma Globulin: 21.3 % — ABNORMAL HIGH (ref 11.1–18.8)
M-Spike, %: 0.45 g/dL
Total Protein, Serum Electrophoresis: 6.7 g/dL (ref 6.0–8.3)

## 2014-10-14 ENCOUNTER — Ambulatory Visit: Payer: Medicare Other | Admitting: Family

## 2014-10-14 ENCOUNTER — Other Ambulatory Visit: Payer: Self-pay | Admitting: Podiatrist

## 2014-10-14 DIAGNOSIS — C9000 Multiple myeloma not having achieved remission: Secondary | ICD-10-CM

## 2014-10-14 MED ORDER — ROPINIROLE HCL 3 MG PO TABS: 3.0000 mg | ORAL_TABLET | Freq: Every day | ORAL | Status: DC

## 2014-10-19 ENCOUNTER — Telehealth: Payer: Self-pay | Admitting: *Deleted

## 2014-10-19 NOTE — Telephone Encounter (Signed)
-----   Message from Bronson Ing, DPM sent at 10/14/2014  6:23 PM EST ----- Regarding: med rx called in Would you mind calling Dr. Clemetine Marker to let him know I refilled the ropinerole for him.. Thank  You!

## 2014-10-19 NOTE — Telephone Encounter (Signed)
I called and informed the patient that Dr. Valentina Lucks refilled the Ropinerole.  "With [facility], okay thank you."

## 2014-10-21 ENCOUNTER — Other Ambulatory Visit: Payer: Self-pay

## 2014-10-23 ENCOUNTER — Other Ambulatory Visit (HOSPITAL_BASED_OUTPATIENT_CLINIC_OR_DEPARTMENT_OTHER): Payer: Medicare Other

## 2014-10-23 ENCOUNTER — Ambulatory Visit: Payer: Medicare Other

## 2014-10-23 ENCOUNTER — Ambulatory Visit (HOSPITAL_BASED_OUTPATIENT_CLINIC_OR_DEPARTMENT_OTHER): Payer: Medicare Other | Admitting: Nurse Practitioner

## 2014-10-23 ENCOUNTER — Other Ambulatory Visit: Payer: Medicare Other

## 2014-10-23 ENCOUNTER — Telehealth: Payer: Self-pay | Admitting: Nurse Practitioner

## 2014-10-23 ENCOUNTER — Encounter: Payer: Self-pay | Admitting: Nurse Practitioner

## 2014-10-23 ENCOUNTER — Ambulatory Visit (HOSPITAL_BASED_OUTPATIENT_CLINIC_OR_DEPARTMENT_OTHER): Payer: Medicare Other

## 2014-10-23 ENCOUNTER — Ambulatory Visit: Payer: Medicare Other | Admitting: Nurse Practitioner

## 2014-10-23 VITALS — BP 140/60 | HR 56 | Temp 97.4°F | Resp 18 | Ht 70.0 in | Wt 197.8 lb

## 2014-10-23 DIAGNOSIS — D649 Anemia, unspecified: Secondary | ICD-10-CM

## 2014-10-23 DIAGNOSIS — Z5111 Encounter for antineoplastic chemotherapy: Secondary | ICD-10-CM

## 2014-10-23 DIAGNOSIS — I70209 Unspecified atherosclerosis of native arteries of extremities, unspecified extremity: Secondary | ICD-10-CM

## 2014-10-23 DIAGNOSIS — C9000 Multiple myeloma not having achieved remission: Secondary | ICD-10-CM | POA: Diagnosis present

## 2014-10-23 DIAGNOSIS — G629 Polyneuropathy, unspecified: Secondary | ICD-10-CM

## 2014-10-23 DIAGNOSIS — M869 Osteomyelitis, unspecified: Secondary | ICD-10-CM

## 2014-10-23 DIAGNOSIS — L98499 Non-pressure chronic ulcer of skin of other sites with unspecified severity: Secondary | ICD-10-CM

## 2014-10-23 DIAGNOSIS — D638 Anemia in other chronic diseases classified elsewhere: Secondary | ICD-10-CM | POA: Insufficient documentation

## 2014-10-23 DIAGNOSIS — I739 Peripheral vascular disease, unspecified: Secondary | ICD-10-CM

## 2014-10-23 LAB — CBC WITH DIFFERENTIAL/PLATELET
BASO%: 0.1 % (ref 0.0–2.0)
Basophils Absolute: 0 10*3/uL (ref 0.0–0.1)
EOS%: 0.3 % (ref 0.0–7.0)
Eosinophils Absolute: 0 10*3/uL (ref 0.0–0.5)
HCT: 28.7 % — ABNORMAL LOW (ref 38.4–49.9)
HGB: 9.2 g/dL — ABNORMAL LOW (ref 13.0–17.1)
LYMPH%: 25.4 % (ref 14.0–49.0)
MCH: 27.9 pg (ref 27.2–33.4)
MCHC: 32.1 g/dL (ref 32.0–36.0)
MCV: 87 fL (ref 79.3–98.0)
MONO#: 0.8 10*3/uL (ref 0.1–0.9)
MONO%: 11.4 % (ref 0.0–14.0)
NEUT#: 4.3 10*3/uL (ref 1.5–6.5)
NEUT%: 62.8 % (ref 39.0–75.0)
PLATELETS: 196 10*3/uL (ref 140–400)
RBC: 3.3 10*6/uL — ABNORMAL LOW (ref 4.20–5.82)
RDW: 17.7 % — AB (ref 11.0–14.6)
WBC: 6.9 10*3/uL (ref 4.0–10.3)
lymph#: 1.8 10*3/uL (ref 0.9–3.3)
nRBC: 0 % (ref 0–0)

## 2014-10-23 LAB — COMPREHENSIVE METABOLIC PANEL (CC13)
ALT: 22 U/L (ref 0–55)
AST: 23 U/L (ref 5–34)
Albumin: 3.5 g/dL (ref 3.5–5.0)
Alkaline Phosphatase: 57 U/L (ref 40–150)
Anion Gap: 9 mEq/L (ref 3–11)
BILIRUBIN TOTAL: 0.36 mg/dL (ref 0.20–1.20)
BUN: 28.2 mg/dL — AB (ref 7.0–26.0)
CO2: 23 meq/L (ref 22–29)
Calcium: 9.2 mg/dL (ref 8.4–10.4)
Chloride: 105 mEq/L (ref 98–109)
Creatinine: 1.1 mg/dL (ref 0.7–1.3)
EGFR: 62 mL/min/{1.73_m2} — AB (ref >90)
GLUCOSE: 83 mg/dL (ref 70–140)
Potassium: 3.9 mEq/L (ref 3.5–5.1)
Sodium: 136 mEq/L (ref 136–145)
Total Protein: 6.6 g/dL (ref 6.4–8.3)

## 2014-10-23 MED ORDER — DEXAMETHASONE SODIUM PHOSPHATE 10 MG/ML IJ SOLN
4.0000 mg | Freq: Once | INTRAMUSCULAR | Status: AC
  Administered 2014-10-23: 4 mg via INTRAVENOUS

## 2014-10-23 MED ORDER — DEXAMETHASONE SODIUM PHOSPHATE 20 MG/5ML IJ SOLN
INTRAMUSCULAR | Status: AC
  Filled 2014-10-23: qty 10

## 2014-10-23 MED ORDER — ONDANSETRON 8 MG/50ML IVPB (CHCC)
8.0000 mg | Freq: Once | INTRAVENOUS | Status: AC
  Administered 2014-10-23: 8 mg via INTRAVENOUS

## 2014-10-23 MED ORDER — DEXAMETHASONE SODIUM PHOSPHATE 10 MG/ML IJ SOLN
INTRAMUSCULAR | Status: AC
  Filled 2014-10-23: qty 1

## 2014-10-23 MED ORDER — SODIUM CHLORIDE 0.9 % IV SOLN
Freq: Once | INTRAVENOUS | Status: AC
  Administered 2014-10-23: 15:00:00 via INTRAVENOUS

## 2014-10-23 MED ORDER — ONDANSETRON 8 MG/NS 50 ML IVPB
INTRAVENOUS | Status: AC
  Filled 2014-10-23: qty 8

## 2014-10-23 MED ORDER — SODIUM CHLORIDE 0.9 % IV SOLN
300.0000 mg/m2 | Freq: Once | INTRAVENOUS | Status: AC
  Administered 2014-10-23: 620 mg via INTRAVENOUS
  Filled 2014-10-23: qty 31

## 2014-10-23 MED ORDER — DARBEPOETIN ALFA 200 MCG/0.4ML IJ SOSY
200.0000 ug | PREFILLED_SYRINGE | Freq: Once | INTRAMUSCULAR | Status: AC
  Administered 2014-10-23: 200 ug via SUBCUTANEOUS
  Filled 2014-10-23: qty 0.4

## 2014-10-23 NOTE — Telephone Encounter (Signed)
, °

## 2014-10-23 NOTE — Progress Notes (Signed)
ID: Timothy [name]   DOB: 12-10-1927  MR#: [id number]  IRW#:[id number]  PCP:  SU: OTHER MD: Dorna Leitz, Alysia Penna, Georgia Duff, Geryl Councilman  CHIEF COMPLAINT: multiple myeloma CURRENT THERAPY: cyclophosphamide, dexamethasone  MYELOMA HISTORY: From the earlier summary Patient presented in April 2007 with symptomatic slowly progressive anemia, with normal iron parameters, folic acid, and vitamin B12. Plans were made to obtain bone marrow aspirate and biopsy as an outpatient, however the patient was hospitalized shortly thereafter with a creatinine of 8.6. Renal biopsy demonstrated the presence of  light chain deposition disease. Bone marrow biopsy demonstrated 37% plasma cells. A skeletal survey showed small calvarial lytic lesions, mild osteopenia, and cervical spondylosis. With a well-established diagnosis of light-chain multiple myeloma Dr. Melodie Bouillon subsequent treatments are as detailed below.   INTERVAL HISTORY: Timothy [name] returns today with his wife, [name], for follow up of his multiple myeloma. Today is day 1, cycle 2 of cyclophosphamide. He also takes $RemoveBe'20mg'RnlrNCyrv$  of dexamethasone twice weekly. He believes his peripheral neuropathy has continued to worsen and was markedly different after treatment. He is currently also taking about $RemoveBefo'4mg'MPOXIwouaEq$  dilaudid daily for his back.   REVIEW OF SYSTEMS: Timothy [name] is doing moderately well today. He denies fevers, chills, nausea, or vomiting. He had not taken any of the PRN anti-emetics at home because he has not needed them. The dexamethasone has increased his appetite and he believes he is gaining weight. He has had some occasional constipation but is taking stool softeners and miralax as needed. Dr. [name] increased his SIADH fluid restriction to 2 quarts daily. He is moving around better and is staring to use his walker less at home and in familiar places. His leg swelling has reduced and he is  wearing compression socks daily.  He denies headaches, dizziness, unexplained bleeding, or night sweats. He has some shortness of breath with exertion, but denies cough, palpitations, chest pain, or fatigue. A detailed review of systems is otherwise negative.   PAST MEDICAL HISTORY: Past Medical History  Diagnosis Date  . ASCVD (arteriosclerotic cardiovascular disease)   . Anemia     chronic mild anemia  . Gout   . Hypertension   . Lumbar disc disease     post laminectomy  . Chronic back pain   . History of echocardiogram 11/02/2010    EF range of 30 to 35% / There is hypokinsesis of the basal-mild inferolateral myocardium  . CHF (congestive heart failure)   . Heart murmur   . Ischemic cardiomyopathy   . Renal insufficiency   . Peripheral neuropathy   . Inferior myocardial infarction 1986  . Hyperlipidemia   . SOB (shortness of breath)   . Fatigue   . Peripheral neuropathy   . Coronary artery disease     CABG 2002 by Dr. Roxy Manns with LIMA to LAD, SVG to intermediate, SVG to LCX, & SVG to PL. Last nuclear in 2012 showing large inferior scar with EF of 39%.   . Blood transfusion     DEC 2012 - TWO UNITS  . Myeloma     Multiple myeloma, with recurrence of increasing problems  DR. [place] -ONCOLOGIST.  PT HAS BEEN OFF CHEMO SINCE HIS HOSPITALIZATION FEB 2013 FOR LEFT FOOT INFECTION  . Cancer   . History of shingles MARCH 2009    SHINGLE LESIONS WERE AROUND RIGHT EYE--PT HAS RESIDUAL ITCHING AROUND THE EYE.  . Osteomyelitis     s/p toe amputation  . Chronic systolic  heart failure     EF of 30% per echo 02/2012  . Protein malnutrition   . Critical lower limb ischemia    PAST SURGICAL HISTORY: Past Surgical History  Procedure Laterality Date  . Laminectomy    . Inguinal hernia repair    . Cardiac catheterization  06/20/2000    Severe coronary disease (totally occluded right artery, 90% left circumflex, 50-60% intermediate, and 90-95% ostial left anterior descending)   .  Tonsillectomy    . Coronary artery bypass graft  2002    x4 / Left internal mammary artery to the LAD.  / Saphenous vein graft to the right posterolateral.  /  Saphenous vein graft to  the ramus intermedius. / Saphenous vein graft to the circumflex marginal     . Amputation  03/06/2012    Procedure: AMPUTATION RAY;  Surgeon: Alta Corning, MD;  Location: [facility];  Service: Orthopedics;  Laterality: Left;  First Ray Amputation  . Intramedullary (im) nail intertrochanteric Right 08/17/2013    Procedure: INTRAMEDULLARY (IM) NAIL INTERTROCHANTRIC;  Surgeon: Marybelle Killings, MD;  Location: [facility];  Service: Orthopedics;  Laterality: Right;  . Endarterectomy femoral Left 03/24/2014    Procedure: Left Common Femoral  Artery Endarterectomy with Vein Patch Angioplasty,  Ultrasound ;  Surgeon: Angelia Mould, MD;  Location: [facility];  Service: Vascular;  Laterality: Left;  . 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 He and his wife [name] are the only ones at home.    ADVANCED DIRECTIVES:   HEALTH MAINTENANCE: History  Substance Use Topics  . Smoking status: Former Smoker -- 1.00 packs/day for 20 years    Types: Cigarettes    Quit date: 11/20/1984  . Smokeless tobacco: Never Used  . Alcohol Use: No     Colonoscopy:  Bone density:  Lipid panel:  Allergies  Allergen Reactions  . Morphine And Related Other (See Comments)    Agitated with morphine drip    Current Outpatient Prescriptions  Medication Sig Dispense Refill  . allopurinol (ZYLOPRIM) 300 MG tablet Take 0.5 tablets (150 mg total) by mouth daily. 90 tablet 3  . amLODipine (NORVASC) 2.5 MG tablet Take 1 tablet (2.5 mg total) by mouth daily. 90 tablet 3  . aspirin EC 81 MG tablet Take 81 mg by mouth 3 (three) times a week. Monday, Wednesday, Friday    .  atorvastatin (LIPITOR) 10 MG tablet Take 1 tablet (10 mg total) by mouth every evening. 90 tablet 3  . B Complex-Biotin-FA (B COMPLETE PO) Take 1 tablet by mouth daily.     . carvedilol (COREG) 6.25 MG tablet Take 1 tablet (6.25 mg total) by mouth 2 (two) times daily with a meal. 180 tablet 3  . Coenzyme Q10 (CO Q-10 PO) Take 1 tablet by mouth every morning.     . darbepoetin (ARANESP) 200 MCG/0.4ML SOLN injection Inject 200 mcg into the skin as directed.    Marland Kitchen dexamethasone (DECADRON) 4 MG tablet Take five tablets 2 consecutive days every week 40 tablet 6  . HYDROmorphone (DILAUDID) 4 MG tablet Take 1 tablet (4 mg total) by mouth every 4 (four) hours as needed for severe pain. 30 tablet 0  . lisinopril (PRINIVIL,ZESTRIL) 20 MG tablet Take 20 mg by mouth daily.    . Multiple Vitamin (MULTIVITAMIN) capsule Take 1 capsule by  mouth every morning.     Marland Kitchen rOPINIRole (REQUIP) 3 MG tablet Take 1 tablet (3 mg total) by mouth at bedtime. 90 tablet 4  . tamsulosin (FLOMAX) 0.4 MG CAPS capsule Take 0.4 mg by mouth at bedtime.    Marland Kitchen tobramycin (TOBREX) 0.3 % ophthalmic solution Place 1 drop into both eyes daily as needed (for Bacterial eye infection).     . vitamin C (ASCORBIC ACID) 500 MG tablet Take 500 mg by mouth every morning.    Marland Kitchen acyclovir (ZOVIRAX) 400 MG tablet Take 1 tablet (400 mg total) by mouth 2 (two) times daily. (Patient not taking: Reported on 10/23/2014) 60 tablet 3  . diphenoxylate-atropine (LOMOTIL) 2.5-0.025 MG per tablet Take 1 tablet by mouth 4 (four) times daily as needed for diarrhea or loose stools. (Patient not taking: Reported on 10/23/2014) 30 tablet 0  . nitroGLYCERIN (NITROSTAT) 0.4 MG SL tablet Place 1 tablet (0.4 mg total) under the tongue every 5 (five) minutes as needed for chest pain. (Patient not taking: Reported on 10/23/2014) 25 tablet 3  . ondansetron (ZOFRAN) 8 MG tablet Take 1 tablet (8 mg total) by mouth 2 (two) times daily as needed (Nausea or vomiting). (Patient not taking:  Reported on 10/23/2014) 30 tablet 1  . polyethylene glycol (MIRALAX / GLYCOLAX) packet Take 17 g by mouth daily as needed. (Patient not taking: Reported on 10/23/2014) 14 each 0   No current facility-administered medications for this visit.   Facility-Administered Medications Ordered in Other Visits  Medication Dose Route Frequency Provider Last Rate Last Dose  . 0.9 %  sodium chloride infusion   Intravenous Once [name], MD      . cyclophosphamide (CYTOXAN) 620 mg in sodium chloride 0.9 % 250 mL chemo infusion  300 mg/m2 (Treatment Plan Actual) Intravenous Once [name], MD      . Dexamethasone Sodium Phosphate (DECADRON) injection 40 mg  40 mg Intravenous Once [name], MD      . ondansetron (ZOFRAN) IVPB 8 mg  8 mg Intravenous Once [name], MD        Objective: Older white man in no acute distress Filed Vitals:   10/23/14 1308  BP: 140/60  Pulse: 56  Temp: 97.4 F (36.3 C)  Resp: 18        Body mass index is 28.38 kg/(m^2).    ECOG FS: 2 Filed Weights   10/23/14 1308  Weight: 197 lb 12.8 oz (89.721 kg)    Skin: warm, dry  HEENT: sclerae anicteric, conjunctivae pink, oropharynx clear. No thrush or mucositis.  Lymph Nodes: No cervical or supraclavicular lymphadenopathy  Lungs: clear to auscultation bilaterally, no rales, wheezes, or rhonci  Heart: regular rate and rhythm  Abdomen: round, soft, non tender, positive bowel sounds  Musculoskeletal: No focal spinal tenderness, no peripheral edema, using walker to ambulate  Neuro: non focal, well oriented, positive affect    LAB RESULTS:  Results for PARTHIV, MUCCI (MRN [id number]) as of 10/10/2014 09:29  Ref. Range 06/05/2014 13:39 07/03/2014 10:53 07/31/2014 10:16 08/28/2014 13:16 09/25/2014 09:48  Kappa free light chain Latest Range: 0.33-1.94 mg/dL 59.50 (H) 61.00 (H) 77.50 (H) 78.60 (H) 135.00 (H)  Lambda Free Lght Chn Latest Range: 0.57-2.63 mg/dL 2.43 2.34 2.64 (H) 2.89 (H) 2.72 (H)   Kappa:Lambda Ratio Latest Range: 0.26-1.65  24.49 (H) 26.07 (H) 29.36 (H) 27.20 (H) 49.63 (H)    Lab Results  Component Value Date   WBC 6.9 10/23/2014   NEUTROABS 4.3  10/23/2014   HGB 9.2* 10/23/2014   HCT 28.7* 10/23/2014   MCV 87.0 10/23/2014   PLT 196 10/23/2014      Chemistry      Component Value Date/Time   NA 136 10/23/2014 1308   NA 126* 08/11/2014 1052   K 3.9 10/23/2014 1308   K 4.0 08/11/2014 1052   CL 96 08/11/2014 1052   CL 98 05/12/2013 1048   CO2 23 10/23/2014 1308   CO2 24 08/11/2014 1052   BUN 28.2* 10/23/2014 1308   BUN 12 08/11/2014 1052   CREATININE 1.1 10/23/2014 1308   CREATININE 1.0 08/11/2014 1052      Component Value Date/Time   CALCIUM 9.2 10/23/2014 1308   CALCIUM 9.0 08/11/2014 1052   ALKPHOS 57 10/23/2014 1308   ALKPHOS 42 03/25/2014 2115   AST 23 10/23/2014 1308   AST 25 03/25/2014 2115   ALT 22 10/23/2014 1308   ALT 10 03/25/2014 2115   BILITOT 0.36 10/23/2014 1308   BILITOT 0.7 03/25/2014 2115     Results for SANTOS, SOLLENBERGER (MRN [id number]) as of 10/10/2014 09:29  Ref. Range 10/02/2006 14:11 01/01/2007 12:38 03/12/2007 10:00 04/30/2007 12:32 05/14/2007 13:00  RETIC # Latest Range: 31.8-103.9 10e3/uL 49.0 121.9 (H) 45.1 30.1 (L) 76.5   STUDIES: No results found.   ASSESSMENT: 80 y.o.  with kappa light chain multiple myeloma   (1) presenting April 2007 with anemia and renal failure; renal Bx showing free kappa light chain deposition; bone marrow biopsy showing 37% plasma cells, with normal cytogenetics and FISH;bone survey showing skull lytic lesions; treated with   (2) thalidomide (200 mg/d) and dexamethasone (40 mg/d x4d Q28d) June through Nov 2007, with good response, but poor tolerance;   (3) thalidomide decreased to 100 mg/d, dexamethasone continued, bortezomib (IV) added, Dec 2007 to March 2008   (4) treatment interrupted by multiple complications (peripheral neuropathy, pulmonary embolism, diverticular abscess, CN V zoster,  severe DDD, congestive heart failure, rising PSA); maintenance zolendronic acid through March 2012   (5) progression April 2012, treated initially with dexamethasone alone, poorly tolerated;   (6) bortezomib (sq) resumed July 2012; cyclophosphamide and dexamethasone added Sept 2012; zolendronic acid changed to Q 56months; all treatments held as of March 2013 due to the development of the Left foot ulcer described above  (7) anemia, on aranesp December 2012 to August 2013; resumed Q14d starting May 2015, with slowly rising reticulocyte count  (8) status post Right trochanteric nail with proximal and distal interlock DePuy 11 mm one-pin lag screw, 44 mm distal interlock 08/09/2013 for a right comminuted intertrochanteric fracture  (9) status post removal of the first 2 toes left foot  (10) Left common femoral artery endarterectomy with vein patch angioplasty using left greater saphenous vein 03/24/2014  (11) status post fall with injury to the right hip, large hematoma, requiring rehabilitation at [facility] until 07/30/2014  (12) on 10/09/2014 starting cyclophosphamide at 300 mg/m every 14 days and dexamethasone 20 mg daily 2 days each week  PLAN:   Timothy [name] is doing well overall. The labs were reviewed in detail and were relatively stable. His hgb is down to 9.2 and he is due for aranesp today. We will proceed with cycle 2 of cyclophosphamide and the oral dexamethasone twice weekly. I consulted with Dr. [name] and the peripheral neuropathy should not be related to the cyclophosphamide. W will continue to monitor this symptom.   Timothy [name] will return in 2 weeks for a follow up visit and his next dose  of aranesp and cyclophosphamide. He understands and agrees with this plan. He has been encouraged to call with any issues that might rise before his next visit here.   Marcelino Duster    10/23/2014

## 2014-10-23 NOTE — Patient Instructions (Signed)
[facility] Discharge Instructions for Patients Receiving Chemotherapy  Today you received the following chemotherapy agents Cytoxan  To help prevent nausea and vomiting after your treatment, we encourage you to take your nausea medication as needed   If you develop nausea and vomiting that is not controlled by your nausea medication, call the clinic.   BELOW ARE SYMPTOMS THAT SHOULD BE REPORTED IMMEDIATELY:  *FEVER GREATER THAN 100.5 F  *CHILLS WITH OR WITHOUT FEVER  NAUSEA AND VOMITING THAT IS NOT CONTROLLED WITH YOUR NAUSEA MEDICATION  *UNUSUAL SHORTNESS OF BREATH  *UNUSUAL BRUISING OR BLEEDING  TENDERNESS IN MOUTH AND THROAT WITH OR WITHOUT PRESENCE OF ULCERS  *URINARY PROBLEMS  *BOWEL PROBLEMS  UNUSUAL RASH Items with * indicate a potential emergency and should be followed up as soon as possible.  Feel free to call the clinic you have any questions or concerns. The clinic phone number is [phone].

## 2014-10-26 ENCOUNTER — Ambulatory Visit: Payer: Medicare Other | Admitting: Nurse Practitioner

## 2014-10-26 ENCOUNTER — Telehealth: Payer: Self-pay | Admitting: *Deleted

## 2014-10-26 LAB — KAPPA/LAMBDA LIGHT CHAINS
Kappa free light chain: 62.3 mg/dL — ABNORMAL HIGH (ref 0.33–1.94)
Kappa:Lambda Ratio: 26.51 — ABNORMAL HIGH (ref 0.26–1.65)
Lambda Free Lght Chn: 2.35 mg/dL (ref 0.57–2.63)

## 2014-10-26 NOTE — Telephone Encounter (Signed)
Per staff message and POF I have scheduled appts. Advised scheduler of appts. JMW  

## 2014-10-27 ENCOUNTER — Encounter: Payer: Self-pay | Admitting: Vascular Surgery

## 2014-10-28 ENCOUNTER — Ambulatory Visit (INDEPENDENT_AMBULATORY_CARE_PROVIDER_SITE_OTHER): Payer: Medicare Other | Admitting: Vascular Surgery

## 2014-10-28 ENCOUNTER — Encounter: Payer: Self-pay | Admitting: Vascular Surgery

## 2014-10-28 VITALS — BP 156/65 | HR 55 | Ht 70.0 in | Wt 199.2 lb

## 2014-10-28 DIAGNOSIS — I2510 Atherosclerotic heart disease of native coronary artery without angina pectoris: Secondary | ICD-10-CM

## 2014-10-28 DIAGNOSIS — Z48812 Encounter for surgical aftercare following surgery on the circulatory system: Secondary | ICD-10-CM

## 2014-10-28 DIAGNOSIS — I70203 Unspecified atherosclerosis of native arteries of extremities, bilateral legs: Secondary | ICD-10-CM

## 2014-10-28 NOTE — Progress Notes (Signed)
[facility]  Patient name: Timothy [name] MRN: [id number] DOB: [birth year] Sex: male  REASON FOR VISIT: Follow up of peripheral vascular disease  HPI: Timothy [name] is a 80 y.o. male who I last saw on 04/08/2014. He had presented with a nonhealing wound of his left foot. He underwent a left common femoral artery endarterectomy with vein patch angioplasty on 03/24/2014. At the time of his last visit, the toe wound was stable. He comes in for a 7 month follow up visit. He does have known severe tibial artery occlusive disease area he also had some moderate disease in his left common iliac artery but no focal stenosis. Follow up Doppler studies were to be done in Dr. Jenness Corner office.  I did find a Doppler study from June 2015 which shows an ABI of 69% on the right and 71% on the left.  Since I saw him last, he denies any specific complaints. He's been ablating without difficulty.   Past Medical History  Diagnosis Date  . ASCVD (arteriosclerotic cardiovascular disease)   . Anemia     chronic mild anemia  . Gout   . Hypertension   . Lumbar disc disease     post laminectomy  . Chronic back pain   . History of echocardiogram 11/02/2010    EF range of 30 to 35% / There is hypokinsesis of the basal-mild inferolateral myocardium  . CHF (congestive heart failure)   . Heart murmur   . Ischemic cardiomyopathy   . Renal insufficiency   . Peripheral neuropathy   . Inferior myocardial infarction 1986  . Hyperlipidemia   . SOB (shortness of breath)   . Fatigue   . Peripheral neuropathy   . Coronary artery disease     CABG 2002 by Dr. Roxy Manns with LIMA to LAD, SVG to intermediate, SVG to LCX, & SVG to PL. Last nuclear in 2012 showing large inferior scar with EF of 39%.   . Blood transfusion     DEC 2012 - TWO UNITS  . Myeloma     Multiple myeloma, with recurrence of increasing problems  DR. [place] -ONCOLOGIST.  PT HAS BEEN OFF CHEMO SINCE HIS  HOSPITALIZATION FEB 2013 FOR LEFT FOOT INFECTION  . Cancer   . History of shingles MARCH 2009    SHINGLE LESIONS WERE AROUND RIGHT EYE--PT HAS RESIDUAL ITCHING AROUND THE EYE.  . Osteomyelitis     s/p toe amputation  . Chronic systolic heart failure     EF of 30% per echo 02/2012  . Protein malnutrition   . Critical lower limb ischemia   . Peripheral vascular disease    Family History  Problem Relation Age of Onset  . Heart attack Mother   . Heart disease Father    SOCIAL HISTORY: History  Substance Use Topics  . Smoking status: Former Smoker -- 1.00 packs/day for 20 years    Types: Cigarettes    Quit date: 11/20/1984  . Smokeless tobacco: Never Used  . Alcohol Use: No   Allergies  Allergen Reactions  . Morphine And Related Other (See Comments)    Agitated with morphine drip   REVIEW OF SYSTEMS: Valu.Nieves ] denotes positive finding; [  ] denotes negative finding  CARDIOVASCULAR:  $RemoveBeforeDE'[ ]'PQzhHXMrWaHUofM$  chest pain   '[ ]'$  chest pressure   '[ ]'$  palpitations   '[X]'$  orthopnea   Valu.Nieves ] dyspnea on exertion   '[ ]'$  claudication   '[ ]'$  rest pain   '[ ]'$   DVT   [ ]  phlebitis PULMONARY:   [ ]  productive cough   [ ]  asthma   [ ]  wheezing NEUROLOGIC:   [ ]  weakness  [ ]  paresthesias  [ ]  aphasia  [ ]  amaurosis  [ ]  dizziness HEMATOLOGIC:   [ ]  bleeding problems   [ ]  clotting disorders MUSCULOSKELETAL:  [ ]  joint pain   [ ]  joint swelling Valu.Nieves ] leg swelling GASTROINTESTINAL: [ ]   blood in stool  [ ]   hematemesis GENITOURINARY:  [ ]   dysuria  [ ]   hematuria PSYCHIATRIC:  [ ]  history of major depression INTEGUMENTARY:  [ ]  rashes  [ ]  ulcers CONSTITUTIONAL:  [ ]  fever   [ ]  chills  PHYSICAL EXAM: Filed Vitals:   10/28/14 1115  BP: 156/65  Pulse: 55  Height: 5\' 10"  (1.778 m)  Weight: 199 lb 3.2 oz (90.357 kg)  SpO2: 99%   Body mass index is 28.58 kg/(m^2). GENERAL: The patient is a well-nourished male, in no acute distress. The vital signs are documented above. CARDIOVASCULAR: There is a regular rate and rhythm. He  has a palpable left femoral pulse. He did not want to remove his shoes and socks Y could not examine his pedal pulses. PULMONARY: There is good air exchange bilaterally without wheezing or rales. ABDOMEN: Soft and non-tender with normal pitched bowel sounds.  SKIN: There are no ulcers or rashes noted. PSYCHIATRIC: The patient has a normal affect.  MEDICAL ISSUES: The patient is doing well status post left femoral endarterectomy. Dr. Quay Burow follows his peripheral vascular disease. I've encouraged him to stay as active as possible. I will be happy to see him back any time as needed.  [place] [facility] [place] Beeper: [phone]

## 2014-10-29 ENCOUNTER — Encounter (HOSPITAL_COMMUNITY): Payer: Self-pay | Admitting: Cardiovascular Disease

## 2014-11-02 ENCOUNTER — Other Ambulatory Visit: Payer: Self-pay | Admitting: Cardiology

## 2014-11-02 ENCOUNTER — Other Ambulatory Visit: Payer: Self-pay | Admitting: Nurse Practitioner

## 2014-11-06 ENCOUNTER — Other Ambulatory Visit: Payer: Medicare Other

## 2014-11-06 ENCOUNTER — Ambulatory Visit (HOSPITAL_BASED_OUTPATIENT_CLINIC_OR_DEPARTMENT_OTHER): Payer: Medicare Other | Admitting: Oncology

## 2014-11-06 ENCOUNTER — Telehealth: Payer: Self-pay | Admitting: *Deleted

## 2014-11-06 ENCOUNTER — Other Ambulatory Visit (HOSPITAL_BASED_OUTPATIENT_CLINIC_OR_DEPARTMENT_OTHER): Payer: Medicare Other

## 2014-11-06 ENCOUNTER — Ambulatory Visit: Payer: Medicare Other

## 2014-11-06 ENCOUNTER — Other Ambulatory Visit: Payer: Self-pay | Admitting: *Deleted

## 2014-11-06 ENCOUNTER — Telehealth: Payer: Self-pay | Admitting: Oncology

## 2014-11-06 ENCOUNTER — Ambulatory Visit (HOSPITAL_BASED_OUTPATIENT_CLINIC_OR_DEPARTMENT_OTHER): Payer: Medicare Other

## 2014-11-06 VITALS — BP 141/65 | HR 63 | Temp 98.4°F | Resp 18 | Ht 70.0 in | Wt 194.2 lb

## 2014-11-06 DIAGNOSIS — C9000 Multiple myeloma not having achieved remission: Secondary | ICD-10-CM

## 2014-11-06 DIAGNOSIS — I70209 Unspecified atherosclerosis of native arteries of extremities, unspecified extremity: Secondary | ICD-10-CM

## 2014-11-06 DIAGNOSIS — N289 Disorder of kidney and ureter, unspecified: Secondary | ICD-10-CM

## 2014-11-06 DIAGNOSIS — M869 Osteomyelitis, unspecified: Secondary | ICD-10-CM

## 2014-11-06 DIAGNOSIS — D630 Anemia in neoplastic disease: Secondary | ICD-10-CM

## 2014-11-06 DIAGNOSIS — Z5111 Encounter for antineoplastic chemotherapy: Secondary | ICD-10-CM

## 2014-11-06 DIAGNOSIS — G629 Polyneuropathy, unspecified: Secondary | ICD-10-CM

## 2014-11-06 DIAGNOSIS — L98499 Non-pressure chronic ulcer of skin of other sites with unspecified severity: Secondary | ICD-10-CM

## 2014-11-06 DIAGNOSIS — E222 Syndrome of inappropriate secretion of antidiuretic hormone: Secondary | ICD-10-CM

## 2014-11-06 DIAGNOSIS — I739 Peripheral vascular disease, unspecified: Secondary | ICD-10-CM

## 2014-11-06 LAB — COMPREHENSIVE METABOLIC PANEL (CC13)
ALT: 19 U/L (ref 0–55)
ANION GAP: 10 meq/L (ref 3–11)
AST: 24 U/L (ref 5–34)
Albumin: 3.5 g/dL (ref 3.5–5.0)
Alkaline Phosphatase: 59 U/L (ref 40–150)
BILIRUBIN TOTAL: 0.59 mg/dL (ref 0.20–1.20)
BUN: 20.5 mg/dL (ref 7.0–26.0)
CALCIUM: 9 mg/dL (ref 8.4–10.4)
CHLORIDE: 106 meq/L (ref 98–109)
CO2: 22 meq/L (ref 22–29)
CREATININE: 1.1 mg/dL (ref 0.7–1.3)
EGFR: 63 mL/min/{1.73_m2} — ABNORMAL LOW (ref >90)
Glucose: 87 mg/dl (ref 70–140)
Potassium: 4.1 mEq/L (ref 3.5–5.1)
Sodium: 137 mEq/L (ref 136–145)
Total Protein: 6.6 g/dL (ref 6.4–8.3)

## 2014-11-06 LAB — CBC & DIFF AND RETIC
BASO%: 0.8 % (ref 0.0–2.0)
Basophils Absolute: 0.1 10*3/uL (ref 0.0–0.1)
EOS%: 1.8 % (ref 0.0–7.0)
Eosinophils Absolute: 0.1 10*3/uL (ref 0.0–0.5)
HCT: 30.5 % — ABNORMAL LOW (ref 38.4–49.9)
HGB: 9.9 g/dL — ABNORMAL LOW (ref 13.0–17.1)
Immature Retic Fract: 5.6 % (ref 3.00–10.60)
LYMPH%: 26.4 % (ref 14.0–49.0)
MCH: 28 pg (ref 27.2–33.4)
MCHC: 32.5 g/dL (ref 32.0–36.0)
MCV: 86.4 fL (ref 79.3–98.0)
MONO#: 0.9 10*3/uL (ref 0.1–0.9)
MONO%: 14.9 % — ABNORMAL HIGH (ref 0.0–14.0)
NEUT#: 3.4 10*3/uL (ref 1.5–6.5)
NEUT%: 56.1 % (ref 39.0–75.0)
NRBC: 0 % (ref 0–0)
PLATELETS: 177 10*3/uL (ref 140–400)
RBC: 3.53 10*6/uL — ABNORMAL LOW (ref 4.20–5.82)
RDW: 18.8 % — ABNORMAL HIGH (ref 11.0–14.6)
Retic %: 1.97 % — ABNORMAL HIGH (ref 0.80–1.80)
Retic Ct Abs: 69.54 10*3/uL (ref 34.80–93.90)
WBC: 6.1 10*3/uL (ref 4.0–10.3)
lymph#: 1.6 10*3/uL (ref 0.9–3.3)

## 2014-11-06 LAB — PROTEIN / CREATININE RATIO, URINE
Creatinine, Urine: 57.5 mg/dL
Protein Creatinine Ratio: 0.45 — ABNORMAL HIGH (ref <0.15)
Total Protein, Urine: 26 mg/dL — ABNORMAL HIGH (ref 5–25)

## 2014-11-06 LAB — FERRITIN CHCC: FERRITIN: 140 ng/mL (ref 22–316)

## 2014-11-06 MED ORDER — DEXAMETHASONE SODIUM PHOSPHATE 10 MG/ML IJ SOLN
INTRAMUSCULAR | Status: AC
  Filled 2014-11-06: qty 1

## 2014-11-06 MED ORDER — SODIUM CHLORIDE 0.9 % IV SOLN
Freq: Once | INTRAVENOUS | Status: AC
  Administered 2014-11-06: 12:00:00 via INTRAVENOUS

## 2014-11-06 MED ORDER — ONDANSETRON 8 MG/NS 50 ML IVPB
INTRAVENOUS | Status: AC
  Filled 2014-11-06: qty 8

## 2014-11-06 MED ORDER — ONDANSETRON 8 MG/50ML IVPB (CHCC)
8.0000 mg | Freq: Once | INTRAVENOUS | Status: AC
  Administered 2014-11-06: 8 mg via INTRAVENOUS

## 2014-11-06 MED ORDER — DARBEPOETIN ALFA 200 MCG/0.4ML IJ SOSY
200.0000 ug | PREFILLED_SYRINGE | Freq: Once | INTRAMUSCULAR | Status: AC
  Administered 2014-11-06: 200 ug via SUBCUTANEOUS
  Filled 2014-11-06: qty 0.4

## 2014-11-06 MED ORDER — DEXAMETHASONE SODIUM PHOSPHATE 10 MG/ML IJ SOLN
4.0000 mg | Freq: Once | INTRAMUSCULAR | Status: AC
  Administered 2014-11-06: 4 mg via INTRAVENOUS

## 2014-11-06 MED ORDER — SODIUM CHLORIDE 0.9 % IV SOLN
300.0000 mg/m2 | Freq: Once | INTRAVENOUS | Status: AC
  Administered 2014-11-06: 620 mg via INTRAVENOUS
  Filled 2014-11-06: qty 31

## 2014-11-06 NOTE — Patient Instructions (Signed)
[facility] Discharge Instructions for Patients Receiving Chemotherapy  Today you received the following chemotherapy agents: Cytoxan  To help prevent nausea and vomiting after your treatment, we encourage you to take your nausea medication as prescribed by your physician.   If you develop nausea and vomiting that is not controlled by your nausea medication, call the clinic.   BELOW ARE SYMPTOMS THAT SHOULD BE REPORTED IMMEDIATELY:  *FEVER GREATER THAN 100.5 F  *CHILLS WITH OR WITHOUT FEVER  NAUSEA AND VOMITING THAT IS NOT CONTROLLED WITH YOUR NAUSEA MEDICATION  *UNUSUAL SHORTNESS OF BREATH  *UNUSUAL BRUISING OR BLEEDING  TENDERNESS IN MOUTH AND THROAT WITH OR WITHOUT PRESENCE OF ULCERS  *URINARY PROBLEMS  *BOWEL PROBLEMS  UNUSUAL RASH Items with * indicate a potential emergency and should be followed up as soon as possible.  Feel free to call the clinic you have any questions or concerns. The clinic phone number is [phone].    

## 2014-11-06 NOTE — Telephone Encounter (Signed)
per pof to sch pt appt-sent MW email to sch pt trmt-pt to get updated copy of sch b4 leaving °

## 2014-11-06 NOTE — Telephone Encounter (Signed)
The last entry for letter was entered by misake.   correct entry Per staff message and POF I have scheduled appts. Advised scheduler of appts. JMW

## 2014-11-06 NOTE — Addendum Note (Signed)
Addended by: Neysa Hotter on: 11/06/2014 01:02 PM   Modules accepted: Medications

## 2014-11-06 NOTE — Telephone Encounter (Signed)
  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  I will no longer be seeing patients at the [facility] after February 08, 2014.  I plan to develop an outpatient clinic at [facility] to manage bleeding, clotting and platelet problems and I will continue to see patients with those kinds of blood disorders.  I will continue to interact with my colleagues at the [facility].  It has been a Arboriculturist and privilege to serve you.  Many of you have become very close to me and I count you as my friends.  I have been truly blessed to be able to share in your lives.  I will sincerely miss you.  I will do my best to make sure your transition to [facility] Internal Medicine clinic goes as smoothly as possible.    Ms. Susette Racer with the [facility] will assist you with appointments for those patients who want to see me there.  The phone number to schedule an appointment with the Internal Medicine Clinic is [phone].   Please contact Dollene Primrose, Executive Director at [facility], if you have any questions regarding this process.  He can be reached at [phone].  [place],  Dr. Murriel Hopper

## 2014-11-06 NOTE — Progress Notes (Signed)
ID: Timothy [name]   DOB: 11-26-1927  MR#: [id number]  HYQ#:[id number]  PCP:  SU: OTHER MD: Dorna Leitz, Alysia Penna, Georgia Duff, Geryl Councilman  CHIEF COMPLAINT: multiple myeloma CURRENT THERAPY: cyclophosphamide, dexamethasone  MYELOMA HISTORY: From the earlier summary Patient presented in April 2007 with symptomatic slowly progressive anemia, with normal iron parameters, folic acid, and vitamin B12. Plans were made to obtain bone marrow aspirate and biopsy as an outpatient, however the patient was hospitalized shortly thereafter with a creatinine of 8.6. Renal biopsy demonstrated the presence of  light chain deposition disease. Bone marrow biopsy demonstrated 37% plasma cells. A skeletal survey showed small calvarial lytic lesions, mild osteopenia, and cervical spondylosis. With a well-established diagnosis of light-chain multiple myeloma Dr. Melodie Bouillon subsequent treatments are as detailed below.   INTERVAL HISTORY: Timothy [name] returns today with his wife, Timothy [name], for follow up of his multiple myeloma. Today is day 1, cycle 3 of intravenous cyclophosphamide given every 14 days. He also takes $RemoveBe'20mg'[id number]$  of dexamethasone twice weekly (on Wednesdays and Thursdays). Timothy [name] is tolerating the treatment remarkably well. He is of course still fatigued and still has peripheral neuropathy but those symptoms are not worsen before. He has had no problems with nausea or vomiting or hair loss. We are following the sodium closely since Cytoxan can also cause low-sodium but his values have been excellent. He continues on 2 L of fluid restriction daily  REVIEW OF SYSTEMS: Timothy [name] feels more short of breath. He tells me this is the way he felt before he needed to have his heart interventions. He is ready scheduled with cardiology next week. I think he may be more anemic than he realizes that we do not yet have labs from today. If his hemoglobin is less than  9 we certainly can proceed to transfusion if that would be helpful. Otherwise his chronic pain continues to be a problem, but he is walking more "by himself" meaning without a walker at home. The one who fell this past week was Timothy [name]. Fortunately she did not injure herself. She did have to eat [employer] for 3 nights in a row. Aside from these issues a detailed review of systems today was noncontributory  PAST MEDICAL HISTORY: Past Medical History  Diagnosis Date  . ASCVD (arteriosclerotic cardiovascular disease)   . Anemia     chronic mild anemia  . Gout   . Hypertension   . Lumbar disc disease     post laminectomy  . Chronic back pain   . History of echocardiogram 11/02/2010    EF range of 30 to 35% / There is hypokinsesis of the basal-mild inferolateral myocardium  . CHF (congestive heart failure)   . Heart murmur   . Ischemic cardiomyopathy   . Renal insufficiency   . Peripheral neuropathy   . Inferior myocardial infarction 1986  . Hyperlipidemia   . SOB (shortness of breath)   . Fatigue   . Peripheral neuropathy   . Coronary artery disease     CABG 2002 by Dr. Roxy Manns with LIMA to LAD, SVG to intermediate, SVG to LCX, & SVG to PL. Last nuclear in 2012 showing large inferior scar with EF of 39%.   . Blood transfusion     DEC 2012 - TWO UNITS  . Myeloma     Multiple myeloma, with recurrence of increasing problems  DR. [place] -ONCOLOGIST.  PT HAS BEEN OFF CHEMO SINCE HIS HOSPITALIZATION FEB 2013 FOR LEFT FOOT INFECTION  .  Cancer   . History of shingles MARCH 2009    SHINGLE LESIONS WERE AROUND RIGHT EYE--PT HAS RESIDUAL ITCHING AROUND THE EYE.  . Osteomyelitis     s/p toe amputation  . Chronic systolic heart failure     EF of 30% per echo 02/2012  . Protein malnutrition   . Critical lower limb ischemia   . Peripheral vascular disease    PAST SURGICAL HISTORY: Past Surgical History  Procedure Laterality Date  . Laminectomy    . Inguinal hernia repair    . Cardiac  catheterization  06/20/2000    Severe coronary disease (totally occluded right artery, 90% left circumflex, 50-60% intermediate, and 90-95% ostial left anterior descending)   . Tonsillectomy    . Coronary artery bypass graft  2002    x4 / Left internal mammary artery to the LAD.  / Saphenous vein graft to the right posterolateral.  /  Saphenous vein graft to  the ramus intermedius. / Saphenous vein graft to the circumflex marginal     . Amputation  03/06/2012    Procedure: AMPUTATION RAY;  Surgeon: Alta Corning, MD;  Location: [facility];  Service: Orthopedics;  Laterality: Left;  First Ray Amputation  . Intramedullary (im) nail intertrochanteric Right 08/17/2013    Procedure: INTRAMEDULLARY (IM) NAIL INTERTROCHANTRIC;  Surgeon: Marybelle Killings, MD;  Location: [facility];  Service: Orthopedics;  Laterality: Right;  . Endarterectomy femoral Left 03/24/2014    Procedure: Left Common Femoral  Artery Endarterectomy with Vein Patch Angioplasty,  Ultrasound ;  Surgeon: Angelia Mould, MD;  Location: [place];  Service: Vascular;  Laterality: Left;  . Toe amputation    . 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 He and his wife Timothy [name] are the only ones at home.    ADVANCED DIRECTIVES:   HEALTH MAINTENANCE: History  Substance Use Topics  . Smoking status: Former Smoker -- 1.00 packs/day for 20 years    Types: Cigarettes    Quit date: 11/20/1984  . Smokeless tobacco: Never Used  . Alcohol Use: No     Colonoscopy:  Bone density:  Lipid panel:  Allergies  Allergen Reactions  . Morphine And Related Other (See Comments)    Agitated with morphine drip     Current Outpatient Prescriptions  Medication Sig Dispense Refill  . acyclovir (ZOVIRAX) 400 MG tablet Take 1 tablet (400 mg total) by mouth 2 (two) times daily. 60 tablet 3  . allopurinol (ZYLOPRIM) 300 MG tablet Take 0.5 tablets (150 mg total) by mouth daily. 90 tablet 3  . amLODipine (NORVASC) 2.5 MG tablet Take 1 tablet (2.5 mg total) by mouth daily. 90 tablet 3  . aspirin EC 81 MG tablet Take 81 mg by mouth 3 (three) times a week. Monday, Wednesday, Friday    . atorvastatin (LIPITOR) 10 MG tablet Take 1 tablet (10 mg total) by mouth every evening. 90 tablet 3  . B Complex-Biotin-FA (B COMPLETE PO) Take 1 tablet by mouth daily.     . carvedilol (COREG) 6.25 MG tablet Take 1 tablet (6.25 mg total) by mouth 2 (two) times daily with a meal. 180 tablet 3  . Coenzyme Q10 (CO Q-10 PO) Take  1 tablet by mouth every morning.     . darbepoetin (ARANESP) 200 MCG/0.4ML SOLN injection Inject 200 mcg into the skin as directed.    Marland Kitchen dexamethasone (DECADRON) 4 MG tablet Take five tablets 2 consecutive days every week 40 tablet 6  . diphenoxylate-atropine (LOMOTIL) 2.5-0.025 MG per tablet Take 1 tablet by mouth 4 (four) times daily as needed for diarrhea or loose stools. 30 tablet 0  . HYDROmorphone (DILAUDID) 4 MG tablet Take 1 tablet (4 mg total) by mouth every 4 (four) hours as needed for severe pain. 30 tablet 0  . lisinopril (PRINIVIL,ZESTRIL) 20 MG tablet Take 20 mg by mouth daily.    Marland Kitchen lisinopril (PRINIVIL,ZESTRIL) 20 MG tablet TAKE 1 TABLET DAILY 90 tablet 1  . Multiple Vitamin (MULTIVITAMIN) capsule Take 1 capsule by mouth every morning.     . nitroGLYCERIN (NITROSTAT) 0.4 MG SL tablet Place 1 tablet (0.4 mg total) under the tongue every 5 (five) minutes as needed for chest pain. 25 tablet 3  . ondansetron (ZOFRAN) 8 MG tablet Take 1 tablet (8 mg total) by mouth 2 (two) times daily as needed (Nausea or vomiting). (Patient not taking: Reported on 10/23/2014) 30 tablet 1  . polyethylene glycol  (MIRALAX / GLYCOLAX) packet Take 17 g by mouth daily as needed. 14 each 0  . rOPINIRole (REQUIP) 3 MG tablet Take 1 tablet (3 mg total) by mouth at bedtime. 90 tablet 4  . tamsulosin (FLOMAX) 0.4 MG CAPS capsule Take 0.4 mg by mouth at bedtime.    Marland Kitchen tobramycin (TOBREX) 0.3 % ophthalmic solution Place 1 drop into both eyes daily as needed (for Bacterial eye infection).     . vitamin C (ASCORBIC ACID) 500 MG tablet Take 500 mg by mouth every morning.     No current facility-administered medications for this visit.    Objective: Older white man  who appears stated age 80 Vitals:   11/06/14 1036  BP: 141/65  Pulse: 63  Temp: 98.4 F (36.9 C)  Resp: 18        Body mass index is 27.86 kg/(m^2).    ECOG FS: 2 Filed Weights   11/06/14 1036  Weight: 194 lb 3.2 oz (88.089 kg)    Sclerae unicteric, pupils equal and reactive Oropharynx clear and moist-- no thrush No cervical or supraclavicular adenopathy Lungs no rales or rhonchi, no dullness to percussion Heart regular rate and rhythm, no murmur appreciated Abd soft, nontender, positive bowel sounds MSK no focal spinal tenderness Neuro: nonfocal, well oriented, positive affect   LAB RESULTS:  Results for JIGAR, ZIELKE (MRN [id number]) as of 11/06/2014 10:54  Ref. Range 07/31/2014 10:16 08/28/2014 13:16 09/25/2014 09:48 10/09/2014 09:50 10/23/2014 13:08  Kappa free light chain Latest Range: 0.33-1.94 mg/dL 77.50 (H) 78.60 (H) 135.00 (H)  62.30 (H)  Lambda Free Lght Chn Latest Range: 0.57-2.63 mg/dL 2.64 (H) 2.89 (H) 2.72 (H)  2.35  Kappa:Lambda Ratio Latest Range: 0.26-1.65  29.36 (H) 27.20 (H) 49.63 (H)  26.51 (H)    Lab Results  Component Value Date   WBC 6.9 10/23/2014   NEUTROABS 4.3 10/23/2014   HGB 9.2* 10/23/2014   HCT 28.7* 10/23/2014   MCV 87.0 10/23/2014   PLT 196 10/23/2014      Chemistry      Component Value Date/Time   NA 136 10/23/2014 1308   NA 126* 08/11/2014 1052   K 3.9 10/23/2014 1308   K 4.0  08/11/2014 1052   CL 96 08/11/2014 1052  CL 98 05/12/2013 1048   CO2 23 10/23/2014 1308   CO2 24 08/11/2014 1052   BUN 28.2* 10/23/2014 1308   BUN 12 08/11/2014 1052   CREATININE 1.1 10/23/2014 1308   CREATININE 1.0 08/11/2014 1052      Component Value Date/Time   CALCIUM 9.2 10/23/2014 1308   CALCIUM 9.0 08/11/2014 1052   ALKPHOS 57 10/23/2014 1308   ALKPHOS 42 03/25/2014 2115   AST 23 10/23/2014 1308   AST 25 03/25/2014 2115   ALT 22 10/23/2014 1308   ALT 10 03/25/2014 2115   BILITOT 0.36 10/23/2014 1308   BILITOT 0.7 03/25/2014 2115      STUDIES: No results found.  ASSESSMENT: 80 y.o.  with kappa light chain multiple myeloma   (1) presenting April 2007 with anemia and renal failure; renal Bx showing free kappa light chain deposition; bone marrow biopsy showing 37% plasma cells, with normal cytogenetics and FISH;bone survey showing skull lytic lesions; treated with   (2) thalidomide (200 mg/d) and dexamethasone (40 mg/d x4d Q28d) June through Nov 2007, with good response, but poor tolerance;   (3) thalidomide decreased to 100 mg/d, dexamethasone continued, bortezomib (IV) added, Dec 2007 to March 2008   (4) treatment interrupted by multiple complications (peripheral neuropathy, pulmonary embolism, diverticular abscess, CN V zoster, severe DDD, congestive heart failure, rising PSA); maintenance zolendronic acid through March 2012   (5) progression April 2012, treated initially with dexamethasone alone, poorly tolerated;   (6) bortezomib (sq) resumed July 2012; cyclophosphamide and dexamethasone added Sept 2012; zolendronic acid changed to Q 56months; all treatments held as of March 2013 due to the development of the Left foot ulcer described above  (7) anemia, on aranesp December 2012 to August 2013; resumed Q14d starting May 2015, with slowly rising reticulocyte count  (8) status post Right trochanteric nail with proximal and distal interlock DePuy 11 mm one-pin lag  screw, 44 mm distal interlock 08/09/2013 for a right comminuted intertrochanteric fracture  (9) status post removal of the first 2 toes left foot  (10) Left common femoral artery endarterectomy with vein patch angioplasty using left greater saphenous vein 03/24/2014  (11) status post fall with injury to the right hip, large hematoma, requiring rehabilitation at [facility] until 07/30/2014  (12) on 10/09/2014 starting cyclophosphamide at 300 mg/m every 14 days and dexamethasone 20 mg daily 2 days each week  (13) SIADH: On 2 L a day fluid restriction Results for SON, BARKAN (MRN [id number]) as of 11/06/2014 10:54  Ref. Range 07/31/2014 10:16 08/11/2014 10:52 08/28/2014 13:16 09/25/2014 09:48 10/23/2014 13:08  Sodium Latest Range: 136-145 mEq/L 137 126 (L) 133 (L) 136 136   PLAN:   Timothy [name] is showing a significant early response to the Cytoxan treatment. He is also tolerating it very well. The plan is going to continue to maximal response and then we will go a few more treatments to make sure we are at plateau phase. At that point we will probably discontinue the Cytoxan and then taper off the dexamethasone.  If his hemoglobin is much lower today as the cause of his shortness of breath I will set him up for transfusion. Otherwise he already has an appointment with cardiology next week. His SIADH appears to be well-controlled. His neuropathy and chronic pain are no worse than before  Timothy [name] has a good understanding of the overall plan. He agrees with it. He knows the goal of treatment in his case is control. He will call with any problems that may  develop before his next visit here, which will be in 2 weeks.   Derwood Becraft C    11/06/2014

## 2014-11-11 ENCOUNTER — Ambulatory Visit (INDEPENDENT_AMBULATORY_CARE_PROVIDER_SITE_OTHER): Payer: Medicare Other | Admitting: Nurse Practitioner

## 2014-11-11 ENCOUNTER — Encounter: Payer: Self-pay | Admitting: Nurse Practitioner

## 2014-11-11 VITALS — BP 140/50 | HR 78 | Ht 70.0 in | Wt 193.8 lb

## 2014-11-11 DIAGNOSIS — I10 Essential (primary) hypertension: Secondary | ICD-10-CM

## 2014-11-11 DIAGNOSIS — R0602 Shortness of breath: Secondary | ICD-10-CM

## 2014-11-11 DIAGNOSIS — N289 Disorder of kidney and ureter, unspecified: Secondary | ICD-10-CM

## 2014-11-11 DIAGNOSIS — I255 Ischemic cardiomyopathy: Secondary | ICD-10-CM

## 2014-11-11 DIAGNOSIS — IMO0001 Reserved for inherently not codable concepts without codable children: Secondary | ICD-10-CM

## 2014-11-11 DIAGNOSIS — I2510 Atherosclerotic heart disease of native coronary artery without angina pectoris: Secondary | ICD-10-CM

## 2014-11-11 LAB — TSH: TSH: 3.01 u[IU]/mL (ref 0.35–4.50)

## 2014-11-11 LAB — BRAIN NATRIURETIC PEPTIDE: Pro B Natriuretic peptide (BNP): 138 pg/mL — ABNORMAL HIGH (ref 0.0–100.0)

## 2014-11-11 MED ORDER — ISOSORBIDE MONONITRATE ER 30 MG PO TB24: 30.0000 mg | ORAL_TABLET | Freq: Every day | ORAL | Status: DC

## 2014-11-11 NOTE — Progress Notes (Signed)
[name] Date of Birth: [birth year] Medical Record #[id number]  History of Present Illness: [name] is seen back today for a 2 month visit. Seen for Dr. Martinique. He has a history of CAD, status post CABG in 2002, ischemic cardiomyopathy - EF of 35 to 40% per echo from August of 2440, chronic systolic CHF, multiple myeloma - followed closely by oncology, prior to amputations for osteomyelitis, CKD, HL, anemia, depression, neuropathy, gout. Lexiscan Myoview (12/18/12): Inf and IL scar with minimal peri-infarct ischemia, EF 41%; Intermediate Risk. Med Rx continued.   Last seen by Dr. Martinique 07/2013. He was given Lasix for one week due to some volume excess. Saw Richardson Dopp, [place] back in March of 2015. He is been followed by podiatry for left second toe ulcer as well as left heel ulcer. ABIs 01/2014 were abnormal with suggestion of significant inflow disease on the left by CFA wave forms and greater than 50% left SFA disease; distal right SFA with short area of occlusion versus calcific shadowing. He has seen Dr. Gwenlyn Found and Dr. Scot Dock and has had left common femoral endarterectomy and patch angioplasty.   I saw him back in early August - he was doing ok - was falling and very limited by his balance. Sodium was low. It improved with fluid restriction.   I saw him back in October - he had fallen again, ended up at rehab and had been released several weeks prior to the visit with me. BP now running high. Wife felt like breathing is shallow. He has been nauseated and dizzy. Had been put on thyroid medicine - but what was interesting was that his TSH was normal on 07/06/14 and then 7 on 07/07/14. He has never had thyroid issues in the past. T4 normal. Repeat study here was totally normal. He was doing better at his last follow up visit.   Comes in today. Here with his grandson, Nori Riis today. [name] fell almost 2 weeks ago. She is now pretty limited in what she can do due knee pain. He is short of breath with  minimal activity. Using his walker. Back on steroids and treatment for his multiple myeloma with cyclophosphamide.  No actual chest pain. Primary issue is shortness of breath and fatigue. He wishes he "could just have an angiogram".   Current Outpatient Prescriptions  Medication Sig Dispense Refill  . acyclovir (ZOVIRAX) 400 MG tablet Take 1 tablet (400 mg total) by mouth 2 (two) times daily. 60 tablet 3  . allopurinol (ZYLOPRIM) 300 MG tablet Take 0.5 tablets (150 mg total) by mouth daily. 90 tablet 3  . amLODipine (NORVASC) 2.5 MG tablet Take 1 tablet (2.5 mg total) by mouth daily. 90 tablet 3  . aspirin EC 81 MG tablet Take 81 mg by mouth 3 (three) times a week. Monday, Wednesday, Friday    . atorvastatin (LIPITOR) 10 MG tablet Take 1 tablet (10 mg total) by mouth every evening. 90 tablet 3  . B Complex-Biotin-FA (B COMPLETE PO) Take 1 tablet by mouth daily.     . carvedilol (COREG) 6.25 MG tablet Take 1 tablet (6.25 mg total) by mouth 2 (two) times daily with a meal. 180 tablet 3  . Coenzyme Q10 (CO Q-10 PO) Take 1 tablet by mouth every morning.     . darbepoetin (ARANESP) 200 MCG/0.4ML SOLN injection Inject 200 mcg into the skin as directed.    Marland Kitchen dexamethasone (DECADRON) 4 MG tablet Take five tablets 2 consecutive days every week 40 tablet 6  .  diphenoxylate-atropine (LOMOTIL) 2.5-0.025 MG per tablet Take 1 tablet by mouth 4 (four) times daily as needed for diarrhea or loose stools. 30 tablet 0  . HYDROmorphone (DILAUDID) 4 MG tablet Take 1 tablet (4 mg total) by mouth every 4 (four) hours as needed for severe pain. 30 tablet 0  . lisinopril (PRINIVIL,ZESTRIL) 20 MG tablet Take 20 mg by mouth daily.    Marland Kitchen lisinopril (PRINIVIL,ZESTRIL) 20 MG tablet TAKE 1 TABLET DAILY 90 tablet 1  . Multiple Vitamin (MULTIVITAMIN) capsule Take 1 capsule by mouth every morning.     . nitroGLYCERIN (NITROSTAT) 0.4 MG SL tablet Place 1 tablet (0.4 mg total) under the tongue every 5 (five) minutes as needed for  chest pain. 25 tablet 3  . ondansetron (ZOFRAN) 8 MG tablet Take 1 tablet (8 mg total) by mouth 2 (two) times daily as needed (Nausea or vomiting). 30 tablet 1  . polyethylene glycol (MIRALAX / GLYCOLAX) packet Take 17 g by mouth daily as needed. 14 each 0  . rOPINIRole (REQUIP) 3 MG tablet Take 1 tablet (3 mg total) by mouth at bedtime. 90 tablet 4  . tamsulosin (FLOMAX) 0.4 MG CAPS capsule Take 0.4 mg by mouth at bedtime.    Marland Kitchen tobramycin (TOBREX) 0.3 % ophthalmic solution Place 1 drop into both eyes daily as needed (for Bacterial eye infection).     . vitamin C (ASCORBIC ACID) 500 MG tablet Take 500 mg by mouth every morning.    . isosorbide mononitrate (IMDUR) 30 MG 24 hr tablet Take 1 tablet (30 mg total) by mouth daily. 30 tablet 3   No current facility-administered medications for this visit.    Allergies  Allergen Reactions  . Morphine And Related Other (See Comments)    Agitated with morphine drip    Past Medical History  Diagnosis Date  . ASCVD (arteriosclerotic cardiovascular disease)   . Anemia     chronic mild anemia  . Gout   . Hypertension   . Lumbar disc disease     post laminectomy  . Chronic back pain   . History of echocardiogram 11/02/2010    EF range of 30 to 35% / There is hypokinsesis of the basal-mild inferolateral myocardium  . CHF (congestive heart failure)   . Heart murmur   . Ischemic cardiomyopathy   . Renal insufficiency   . Peripheral neuropathy   . Inferior myocardial infarction 1986  . Hyperlipidemia   . SOB (shortness of breath)   . Fatigue   . Peripheral neuropathy   . Coronary artery disease     CABG 2002 by Dr. Roxy Manns with LIMA to LAD, SVG to intermediate, SVG to LCX, & SVG to PL. Last nuclear in 2012 showing large inferior scar with EF of 39%.   . Blood transfusion     DEC 2012 - TWO UNITS  . Myeloma     Multiple myeloma, with recurrence of increasing problems  DR. [place] -ONCOLOGIST.  PT HAS BEEN OFF CHEMO SINCE HIS HOSPITALIZATION  FEB 2013 FOR LEFT FOOT INFECTION  . Cancer   . History of shingles MARCH 2009    SHINGLE LESIONS WERE AROUND RIGHT EYE--PT HAS RESIDUAL ITCHING AROUND THE EYE.  . Osteomyelitis     s/p toe amputation  . Chronic systolic heart failure     EF of 30% per echo 02/2012  . Protein malnutrition   . Critical lower limb ischemia   . Peripheral vascular disease     Past Surgical History  Procedure Laterality  Date  . Laminectomy    . Inguinal hernia repair    . Cardiac catheterization  06/20/2000    Severe coronary disease (totally occluded right artery, 90% left circumflex, 50-60% intermediate, and 90-95% ostial left anterior descending)   . Tonsillectomy    . Coronary artery bypass graft  2002    x4 / Left internal mammary artery to the LAD.  / Saphenous vein graft to the right posterolateral.  /  Saphenous vein graft to  the ramus intermedius. / Saphenous vein graft to the circumflex marginal     . Amputation  03/06/2012    Procedure: AMPUTATION RAY;  Surgeon: Alta Corning, MD;  Location: [facility];  Service: Orthopedics;  Laterality: Left;  First Ray Amputation  . Intramedullary (im) nail intertrochanteric Right 08/17/2013    Procedure: INTRAMEDULLARY (IM) NAIL INTERTROCHANTRIC;  Surgeon: Marybelle Killings, MD;  Location: [facility];  Service: Orthopedics;  Laterality: Right;  . Endarterectomy femoral Left 03/24/2014    Procedure: Left Common Femoral  Artery Endarterectomy with Vein Patch Angioplasty,  Ultrasound ;  Surgeon: Angelia Mould, MD;  Location: [place];  Service: Vascular;  Laterality: Left;  . Toe amputation    . Lower extremity angiogram N/A 03/23/2014    Procedure: LOWER EXTREMITY ANGIOGRAM;  Surgeon: Lorretta Harp, MD;  Location: [facility] C[facility] LAB;  Service: Cardiovascular;  Laterality: N/A;    History  Smoking status  . Former Smoker -- 1.00 packs/day for 20 years  . Types: Cigarettes  . Quit date: 11/20/1984  Smokeless tobacco  . Never Used    History  Alcohol Use No    Family  History  Problem Relation Age of Onset  . Heart attack Mother   . Heart disease Father     Review of Systems: The review of systems is per the HPI.  All other systems were reviewed and are negative.  Physical Exam: BP 140/50 mmHg  Pulse 78  Ht 5' 10" (1.778 m)  Wt 193 lb 12.8 oz (87.907 kg)  BMI 27.81 kg/m2  SpO2 98% Patient is very pleasant and in no acute distress. Looks a little frail.  His weight is pretty stable from where I last saw him in October. Skin is warm and dry. Color is normal.  HEENT is unremarkable. Normocephalic/atraumatic. PERRL. Sclera are nonicteric. Neck is supple. No masses. No JVD. Lungs are clear. Cardiac exam shows a regular rate and rhythm. Abdomen is soft. Extremities are without edema. Gait and ROM are intact. Using a walker. No gross neurologic deficits noted.  Wt Readings from Last 3 Encounters:  11/11/14 193 lb 12.8 oz (87.907 kg)  11/06/14 194 lb 3.2 oz (88.089 kg)  10/28/14 199 lb 3.2 oz (90.357 kg)    LABORATORY DATA/PROCEDURES:  Lab Results  Component Value Date   WBC 6.1 11/06/2014   HGB 9.9* 11/06/2014   HCT 30.5* 11/06/2014   PLT 177 11/06/2014   GLUCOSE 87 11/06/2014   CHOL 102 08/13/2013   TRIG 75 08/13/2013   HDL 37* 08/13/2013   LDLCALC 50 08/13/2013   ALT 19 11/06/2014   AST 24 11/06/2014   NA 137 11/06/2014   K 4.1 11/06/2014   CL 96 08/11/2014   CREATININE 1.1 11/06/2014   BUN 20.5 11/06/2014   CO2 22 11/06/2014   TSH 4.32 08/25/2014   PSA 3.75 05/30/2006   INR 1.15 03/23/2014    BNP (last 3 results)  Recent Labs  06/22/14 1146  PROBNP 88.0   Myoview Overall Impression  from 11/2012: Intermediate stress nuclear study. There is a medium size scar involving the basal inferior and basal inferolateral segments. There is minimal peri-infarct ischemia (SDS 4). There is moderate LV systolic dysfunction. EF has increased since last Myoview from 39% to 41%.  LV Ejection Fraction: 41%. LV Wall Motion: The LV ejection  fraction is 41% with inferior wall hypokinesis.  Thomas Brackbill  Assessment / Plan: 1. Shortness of breath - may certainly be his anginal equivalent - I would try to manage medically if at all possible - do not feel we need to just "jump to" angiogram. Will add low dose Imdur at 30 mg. Update his Myoview. If more abnormality noted, then will get him back to talk with Dr. Martinique about proceeding on with cardiac cath.  Recheck BNP today.  2. ?Hypothyroidism - he has never had issues with his thyroid. Will recheck his lab again today to make sure TSH is still normal.  3. Multiple myeloma - back on treatment.   4. Hyponatremia - followed by renal - last level earlier this month was normal.  5. Known CAD with remote CABG from 2002 - will update his Myoview. Adding low dose Imdur.   Further disposition to follow.   Patient is agreeable to this plan and will call if any problems develop in the interim.   Burtis Junes, RN, [place] [address] [place], [place]  [phone]

## 2014-11-11 NOTE — Patient Instructions (Addendum)
We will be checking the following labs today BNP & TSH  Stay on your current medicines but I am adding Imdur 30 mg a day - this is to help with your shortness of breath  Lexiscan Myoview  Based on the stress test results - we will then decide who your follow up needs to be with  Call the [place] office at [phone] if you have any questions, problems or concerns.

## 2014-11-19 ENCOUNTER — Ambulatory Visit (HOSPITAL_BASED_OUTPATIENT_CLINIC_OR_DEPARTMENT_OTHER): Payer: Medicare Other | Admitting: Oncology

## 2014-11-19 ENCOUNTER — Ambulatory Visit: Payer: Medicare Other

## 2014-11-19 ENCOUNTER — Ambulatory Visit (HOSPITAL_BASED_OUTPATIENT_CLINIC_OR_DEPARTMENT_OTHER): Payer: Medicare Other

## 2014-11-19 ENCOUNTER — Other Ambulatory Visit (HOSPITAL_BASED_OUTPATIENT_CLINIC_OR_DEPARTMENT_OTHER): Payer: Medicare Other

## 2014-11-19 ENCOUNTER — Ambulatory Visit: Payer: Medicare Other | Admitting: Nurse Practitioner

## 2014-11-19 ENCOUNTER — Telehealth: Payer: Self-pay | Admitting: Oncology

## 2014-11-19 VITALS — BP 134/65 | HR 73 | Temp 97.5°F | Resp 18 | Ht 70.0 in | Wt 196.5 lb

## 2014-11-19 DIAGNOSIS — Z5111 Encounter for antineoplastic chemotherapy: Secondary | ICD-10-CM

## 2014-11-19 DIAGNOSIS — I739 Peripheral vascular disease, unspecified: Secondary | ICD-10-CM

## 2014-11-19 DIAGNOSIS — D649 Anemia, unspecified: Secondary | ICD-10-CM

## 2014-11-19 DIAGNOSIS — D630 Anemia in neoplastic disease: Secondary | ICD-10-CM

## 2014-11-19 DIAGNOSIS — M869 Osteomyelitis, unspecified: Secondary | ICD-10-CM

## 2014-11-19 DIAGNOSIS — C9000 Multiple myeloma not having achieved remission: Secondary | ICD-10-CM

## 2014-11-19 DIAGNOSIS — L98499 Non-pressure chronic ulcer of skin of other sites with unspecified severity: Principal | ICD-10-CM

## 2014-11-19 DIAGNOSIS — E222 Syndrome of inappropriate secretion of antidiuretic hormone: Secondary | ICD-10-CM

## 2014-11-19 DIAGNOSIS — I70209 Unspecified atherosclerosis of native arteries of extremities, unspecified extremity: Secondary | ICD-10-CM

## 2014-11-19 DIAGNOSIS — N289 Disorder of kidney and ureter, unspecified: Secondary | ICD-10-CM

## 2014-11-19 LAB — CBC WITH DIFFERENTIAL/PLATELET
BASO%: 0.1 % (ref 0.0–2.0)
Basophils Absolute: 0 10*3/uL (ref 0.0–0.1)
EOS ABS: 0 10*3/uL (ref 0.0–0.5)
EOS%: 0.1 % (ref 0.0–7.0)
HCT: 28.3 % — ABNORMAL LOW (ref 38.4–49.9)
HEMOGLOBIN: 9 g/dL — AB (ref 13.0–17.1)
LYMPH%: 12 % — AB (ref 14.0–49.0)
MCH: 27.7 pg (ref 27.2–33.4)
MCHC: 31.8 g/dL — AB (ref 32.0–36.0)
MCV: 87.1 fL (ref 79.3–98.0)
MONO#: 0.3 10*3/uL (ref 0.1–0.9)
MONO%: 4.3 % (ref 0.0–14.0)
NEUT#: 6.3 10*3/uL (ref 1.5–6.5)
NEUT%: 83.5 % — ABNORMAL HIGH (ref 39.0–75.0)
NRBC: 0 % (ref 0–0)
PLATELETS: 188 10*3/uL (ref 140–400)
RBC: 3.25 10*6/uL — AB (ref 4.20–5.82)
RDW: 19.5 % — AB (ref 11.0–14.6)
WBC: 7.5 10*3/uL (ref 4.0–10.3)
lymph#: 0.9 10*3/uL (ref 0.9–3.3)

## 2014-11-19 MED ORDER — ONDANSETRON 8 MG/NS 50 ML IVPB
INTRAVENOUS | Status: AC
  Filled 2014-11-19: qty 8

## 2014-11-19 MED ORDER — DEXAMETHASONE SODIUM PHOSPHATE 10 MG/ML IJ SOLN
4.0000 mg | Freq: Once | INTRAMUSCULAR | Status: AC
Start: 2014-11-19 — End: 2014-11-19
  Administered 2014-11-19: 4 mg via INTRAVENOUS

## 2014-11-19 MED ORDER — ONDANSETRON 8 MG/50ML IVPB (CHCC)
8.0000 mg | Freq: Once | INTRAVENOUS | Status: AC
  Administered 2014-11-19: 8 mg via INTRAVENOUS

## 2014-11-19 MED ORDER — SODIUM CHLORIDE 0.9 % IV SOLN
Freq: Once | INTRAVENOUS | Status: AC
  Administered 2014-11-19: 12:00:00 via INTRAVENOUS

## 2014-11-19 MED ORDER — DARBEPOETIN ALFA 300 MCG/0.6ML IJ SOSY
300.0000 ug | PREFILLED_SYRINGE | Freq: Once | INTRAMUSCULAR | Status: AC
  Administered 2014-11-19: 300 ug via SUBCUTANEOUS
  Filled 2014-11-19: qty 0.6

## 2014-11-19 MED ORDER — ZOLEDRONIC ACID 4 MG/100ML IV SOLN
4.0000 mg | Freq: Once | INTRAVENOUS | Status: AC
  Administered 2014-11-19: 4 mg via INTRAVENOUS
  Filled 2014-11-19: qty 100

## 2014-11-19 MED ORDER — DEXAMETHASONE SODIUM PHOSPHATE 10 MG/ML IJ SOLN
INTRAMUSCULAR | Status: AC
  Filled 2014-11-19: qty 1

## 2014-11-19 MED ORDER — CYCLOPHOSPHAMIDE CHEMO INJECTION 1 GM
300.0000 mg/m2 | Freq: Once | INTRAMUSCULAR | Status: AC
  Administered 2014-11-19: 620 mg via INTRAVENOUS
  Filled 2014-11-19: qty 31

## 2014-11-19 NOTE — Telephone Encounter (Signed)
, °

## 2014-11-19 NOTE — Progress Notes (Signed)
ID: Timothy [name]   DOB: [birth year]  MR#: [id number]  WUJ#:[id number]  PCP:  SU: OTHER MD: Dorna Leitz, Alysia Penna, Georgia Duff, Geryl Councilman  CHIEF COMPLAINT: multiple myeloma CURRENT THERAPY: cyclophosphamide, dexamethasone  MYELOMA HISTORY: From the earlier summary:  Patient presented in April 2007 with symptomatic slowly progressive anemia, with normal iron parameters, folic acid, and vitamin B12. Plans were made to obtain bone marrow aspirate and biopsy as an outpatient, however the patient was hospitalized shortly thereafter with a creatinine of 8.6. Renal biopsy demonstrated the presence of  light chain deposition disease. Bone marrow biopsy demonstrated 37% plasma cells. A skeletal survey showed small calvarial lytic lesions, mild osteopenia, and cervical spondylosis. With a well-established diagnosis of light-chain multiple myeloma Dr. Melodie Bouillon subsequent treatments are as detailed below.   INTERVAL HISTORY: Timothy [name] returns today with his wife, [name], for follow up of his multiple myeloma. Today is day 1 cycle 4 of his every 2 week Cytoxan treatments. He also takes dexamethasone 20 mg 2 days a week. He is tolerating treatment remarkably well, and has had no significant problems in terms of access, nausea, vomiting, hyponatremia, or thrush. They have been no intercurrent fevers, rash, or bleeding.  REVIEW OF SYSTEMS: Timothy [name] at all the family over for Christmas and enjoyed it. He is planning on a quiet New Year's Eve. He is using a walker. He has some right hip pain, which as however improved recently. He takes Tylenol at bed less frequently than before. It does not constipate or nauseate him. He is more short of breath. He is in process of a cardiac workup under Dr. Martinique and is scheduled for a perfusion scan next week. There has been no change in bowel or bladder habits, no unusual headaches, no visual changes, no  thrush or mouth sores. A detailed review of systems was otherwise stable.  PAST MEDICAL HISTORY: Past Medical History  Diagnosis Date  . ASCVD (arteriosclerotic cardiovascular disease)   . Anemia     chronic mild anemia  . Gout   . Hypertension   . Lumbar disc disease     post laminectomy  . Chronic back pain   . History of echocardiogram 11/02/2010    EF range of 30 to 35% / There is hypokinsesis of the basal-mild inferolateral myocardium  . CHF (congestive heart failure)   . Heart murmur   . Ischemic cardiomyopathy   . Renal insufficiency   . Peripheral neuropathy   . Inferior myocardial infarction 1986  . Hyperlipidemia   . SOB (shortness of breath)   . Fatigue   . Peripheral neuropathy   . Coronary artery disease     CABG 2002 by Dr. Roxy Manns with LIMA to LAD, SVG to intermediate, SVG to LCX, & SVG to PL. Last nuclear in 2012 showing large inferior scar with EF of 39%.   . Blood transfusion     DEC 2012 - TWO UNITS  . Myeloma     Multiple myeloma, with recurrence of increasing problems  DR. Rembrandt -ONCOLOGIST.  PT HAS BEEN OFF CHEMO SINCE HIS HOSPITALIZATION FEB 2013 FOR LEFT FOOT INFECTION  . Cancer   . History of shingles MARCH 2009    SHINGLE LESIONS WERE AROUND RIGHT EYE--PT HAS RESIDUAL ITCHING AROUND THE EYE.  . Osteomyelitis     s/p toe amputation  . Chronic systolic heart failure     EF of 30% per echo 02/2012  . Protein malnutrition   .  Critical lower limb ischemia   . Peripheral vascular disease    PAST SURGICAL HISTORY: Past Surgical History  Procedure Laterality Date  . Laminectomy    . Inguinal hernia repair    . Cardiac catheterization  06/20/2000    Severe coronary disease (totally occluded right artery, 90% left circumflex, 50-60% intermediate, and 90-95% ostial left anterior descending)   . Tonsillectomy    . Coronary artery bypass graft  2002    x4 / Left internal mammary artery to the LAD.  / Saphenous vein graft to the right posterolateral.  /   Saphenous vein graft to  the ramus intermedius. / Saphenous vein graft to the circumflex marginal     . Amputation  03/06/2012    Procedure: AMPUTATION RAY;  Surgeon: Alta Corning, MD;  Location: [facility];  Service: Orthopedics;  Laterality: Left;  First Ray Amputation  . Intramedullary (im) nail intertrochanteric Right 08/17/2013    Procedure: INTRAMEDULLARY (IM) NAIL INTERTROCHANTRIC;  Surgeon: Marybelle Killings, MD;  Location: [facility];  Service: Orthopedics;  Laterality: Right;  . Endarterectomy femoral Left 03/24/2014    Procedure: Left Common Femoral  Artery Endarterectomy with Vein Patch Angioplasty,  Ultrasound ;  Surgeon: Angelia Mould, MD;  Location: [facility];  Service: Vascular;  Laterality: Left;  . Toe amputation    . 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 He and his wife [name] are the only ones at home.    ADVANCED DIRECTIVES:   HEALTH MAINTENANCE: History  Substance Use Topics  . Smoking status: Former Smoker -- 1.00 packs/day for 20 years    Types: Cigarettes    Quit date: 11/20/1984  . Smokeless tobacco: Never Used  . Alcohol Use: No     Colonoscopy:  Bone density:  Lipid panel:  Allergies  Allergen Reactions  . Morphine And Related Other (See Comments)    Agitated with morphine drip    Current Outpatient Prescriptions  Medication Sig Dispense Refill  . acyclovir (ZOVIRAX) 400 MG tablet Take 1 tablet (400 mg total) by mouth 2 (two) times daily. 60 tablet 3  . allopurinol (ZYLOPRIM) 300 MG tablet Take 0.5 tablets (150 mg total) by mouth daily. 90 tablet 3  . amLODipine (NORVASC) 2.5 MG tablet Take 1 tablet (2.5 mg total) by  mouth daily. 90 tablet 3  . aspirin EC 81 MG tablet Take 81 mg by mouth 3 (three) times a week. Monday, Wednesday, Friday    . atorvastatin (LIPITOR) 10 MG tablet Take 1 tablet (10 mg total) by mouth every evening. 90 tablet 3  . B Complex-Biotin-FA (B COMPLETE PO) Take 1 tablet by mouth daily.     . carvedilol (COREG) 6.25 MG tablet Take 1 tablet (6.25 mg total) by mouth 2 (two) times daily with a meal. 180 tablet 3  . Coenzyme Q10 (CO Q-10 PO) Take 1 tablet by mouth every morning.     . darbepoetin (ARANESP) 200 MCG/0.4ML SOLN injection Inject 200 mcg into the skin as directed.    Marland Kitchen dexamethasone (DECADRON) 4 MG tablet Take five tablets 2 consecutive days every week 40 tablet 6  . diphenoxylate-atropine (LOMOTIL) 2.5-0.025 MG per tablet Take 1 tablet by  mouth 4 (four) times daily as needed for diarrhea or loose stools. 30 tablet 0  . HYDROmorphone (DILAUDID) 4 MG tablet Take 1 tablet (4 mg total) by mouth every 4 (four) hours as needed for severe pain. 30 tablet 0  . isosorbide mononitrate (IMDUR) 30 MG 24 hr tablet Take 1 tablet (30 mg total) by mouth daily. 30 tablet 3  . lisinopril (PRINIVIL,ZESTRIL) 20 MG tablet Take 20 mg by mouth daily.    Marland Kitchen lisinopril (PRINIVIL,ZESTRIL) 20 MG tablet TAKE 1 TABLET DAILY 90 tablet 1  . Multiple Vitamin (MULTIVITAMIN) capsule Take 1 capsule by mouth every morning.     . nitroGLYCERIN (NITROSTAT) 0.4 MG SL tablet Place 1 tablet (0.4 mg total) under the tongue every 5 (five) minutes as needed for chest pain. 25 tablet 3  . ondansetron (ZOFRAN) 8 MG tablet Take 1 tablet (8 mg total) by mouth 2 (two) times daily as needed (Nausea or vomiting). 30 tablet 1  . polyethylene glycol (MIRALAX / GLYCOLAX) packet Take 17 g by mouth daily as needed. 14 each 0  . rOPINIRole (REQUIP) 3 MG tablet Take 1 tablet (3 mg total) by mouth at bedtime. 90 tablet 4  . tamsulosin (FLOMAX) 0.4 MG CAPS capsule Take 0.4 mg by mouth at bedtime.    Marland Kitchen tobramycin (TOBREX) 0.3 % ophthalmic  solution Place 1 drop into both eyes daily as needed (for Bacterial eye infection).     . vitamin C (ASCORBIC ACID) 500 MG tablet Take 500 mg by mouth every morning.     No current facility-administered medications for this visit.    Objective: Older white man using a walker Filed Vitals:   11/19/14 1037  BP: 134/65  Pulse: 73  Temp: 97.5 F (36.4 C)  Resp: 18        Body mass index is 28.19 kg/(m^2).    ECOG FS: 2 Filed Weights   11/19/14 1037  Weight: 196 lb 8 oz (89.132 kg)    Sclerae unicteric, EOMs intact Oropharynx clear and moist-- no thrush or other lesions noted, dentition in good repair  No cervical or supraclavicular adenopathy Lungs no rales or rhonchi Heart regular rate and rhythm Abd soft, nontender, positive bowel sounds MSK no focal spinal tenderness Neuro: nonfocal, well oriented, positive affect   LAB RESULTS:  Results for MARCIA, LEPERA (MRN [id number]) as of 11/06/2014 10:54  Ref. Range 07/31/2014 10:16 08/28/2014 13:16 09/25/2014 09:48 10/09/2014 09:50 10/23/2014 13:08  Kappa free light chain Latest Range: 0.33-1.94 mg/dL 77.50 (H) 78.60 (H) 135.00 (H)  62.30 (H)  Lambda Free Lght Chn Latest Range: 0.57-2.63 mg/dL 2.64 (H) 2.89 (H) 2.72 (H)  2.35  Kappa:Lambda Ratio Latest Range: 0.26-1.65  29.36 (H) 27.20 (H) 49.63 (H)  26.51 (H)    Lab Results  Component Value Date   WBC 7.5 11/19/2014   NEUTROABS 6.3 11/19/2014   HGB 9.0* 11/19/2014   HCT 28.3* 11/19/2014   MCV 87.1 11/19/2014   PLT 188 11/19/2014      Chemistry      Component Value Date/Time   NA 137 11/06/2014 1035   NA 126* 08/11/2014 1052   K 4.1 11/06/2014 1035   K 4.0 08/11/2014 1052   CL 96 08/11/2014 1052   CL 98 05/12/2013 1048   CO2 22 11/06/2014 1035   CO2 24 08/11/2014 1052   BUN 20.5 11/06/2014 1035   BUN 12 08/11/2014 1052   CREATININE 1.1 11/06/2014 1035   CREATININE 1.0 08/11/2014 1052  Component Value Date/Time   CALCIUM 9.0 11/06/2014 1035   CALCIUM 9.0  08/11/2014 1052   ALKPHOS 59 11/06/2014 1035   ALKPHOS 42 03/25/2014 2115   AST 24 11/06/2014 1035   AST 25 03/25/2014 2115   ALT 19 11/06/2014 1035   ALT 10 03/25/2014 2115   BILITOT 0.59 11/06/2014 1035   BILITOT 0.7 03/25/2014 2115      STUDIES: No results found.  ASSESSMENT: 80 y.o.  with kappa light chain multiple myeloma   (1) presenting April 2007 with anemia and renal failure; renal Bx showing free kappa light chain deposition; bone marrow biopsy showing 37% plasma cells, with normal cytogenetics and FISH;bone survey showing skull lytic lesions; treated with   (2) thalidomide (200 mg/d) and dexamethasone (40 mg/d x4d Q28d) June through Nov 2007, with good response, but poor tolerance;   (3) thalidomide decreased to 100 mg/d, dexamethasone continued, bortezomib (IV) added, Dec 2007 to March 2008   (4) treatment interrupted by multiple complications (peripheral neuropathy, pulmonary embolism, diverticular abscess, CN V zoster, severe DDD, congestive heart failure, rising PSA); maintenance zolendronic acid through March 2012   (5) progression April 2012, treated initially with dexamethasone alone, poorly tolerated;   (6) bortezomib (sq) resumed July 2012; cyclophosphamide and dexamethasone added Sept 2012; zolendronic acid changed to Q 75months; all treatments held as of March 2013 due to the development of the Left foot ulcer described above  (7) anemia, on aranesp December 2012 to August 2013; resumed Q14d starting May 2015, with slowly rising reticulocyte count  (8) status post Right trochanteric nail with proximal and distal interlock DePuy 11 mm one-pin lag screw, 44 mm distal interlock 08/09/2013 for a right comminuted intertrochanteric fracture  (9) status post removal of the first 2 toes left foot  (10) Left common femoral artery endarterectomy with vein patch angioplasty using left greater saphenous vein 03/24/2014  (11) status post fall with injury to the right hip,  large hematoma, requiring [facility] until 07/30/2014  (12) on 10/09/2014 starting cyclophosphamide at 300 mg/m every 14 days and dexamethasone 20 mg daily 2 days each week  (13) SIADH: On 2 L a day fluid restriction Results for HOBIE, KOHLES (MRN [id number]) as of 11/06/2014 10:54  Ref. Range 07/31/2014 10:16 08/11/2014 10:52 08/28/2014 13:16 09/25/2014 09:48 10/23/2014 13:08  Sodium Latest Range: 136-145 mEq/L 137 126 (L) 133 (L) 136 136   PLAN:   Dick is tolerating the cyclophosphamide well. Of course I am concerned because it can cause hyponatremia and he has a history of SIADH. That is however clinically silent at present. In fact his labs looked terrific except for his hemoglobin, which has dropped a little bit more.  I am increasing his Aranesp to 300 today. Also, since he is receiving steroids weekly I am going to restart the zolendronate. He will receive it today and every 12 weeks. He just saw his dentist yesterday incidentally and there are no extractions planned.  I'm going to be checking his lab work a couple of days before his next visit here, which will be in 2 weeks. That will allow [place] to assess his response at that time instead of waiting to get the lab work at the day after the visit.  Timothy [name] has a good understanding of the overall plan. He agrees with it. He knows the goal of treatment in his case is control. He will call with any problems that may develop before his next visit here, which will be in 2  weeks.   Lurline Del C    11/19/2014

## 2014-11-19 NOTE — Patient Instructions (Addendum)
[facility] Discharge Instructions for Patients Receiving Chemotherapy  Today you received the following chemotherapy agents: Cytoxan  To help prevent nausea and vomiting after your treatment, we encourage you to take your nausea medication as prescribed by your physician.   If you develop nausea and vomiting that is not controlled by your nausea medication, call the clinic.   BELOW ARE SYMPTOMS THAT SHOULD BE REPORTED IMMEDIATELY:  *FEVER GREATER THAN 100.5 F  *CHILLS WITH OR WITHOUT FEVER  NAUSEA AND VOMITING THAT IS NOT CONTROLLED WITH YOUR NAUSEA MEDICATION  *UNUSUAL SHORTNESS OF BREATH  *UNUSUAL BRUISING OR BLEEDING  TENDERNESS IN MOUTH AND THROAT WITH OR WITHOUT PRESENCE OF ULCERS  *URINARY PROBLEMS  *BOWEL PROBLEMS  UNUSUAL RASH Items with * indicate a potential emergency and should be followed up as soon as possible.  Feel free to call the clinic you have any questions or concerns. The clinic phone number is [phone].   Zoledronic Acid injection (Hypercalcemia, Oncology) What is this medicine? ZOLEDRONIC ACID (ZOE le dron ik AS id) lowers the amount of calcium loss from bone. It is used to treat too much calcium in your blood from cancer. It is also used to prevent complications of cancer that has spread to the bone. This medicine may be used for other purposes; ask your health care provider or pharmacist if you have questions. COMMON BRAND NAME(S): Zometa What should I tell my health care provider before I take this medicine? They need to know if you have any of these conditions: -aspirin-sensitive asthma -cancer, especially if you are receiving medicines used to treat cancer -dental disease or wear dentures -infection -kidney disease -receiving corticosteroids like dexamethasone or prednisone -an unusual or allergic reaction to zoledronic acid, other medicines, foods, dyes, or preservatives -pregnant or trying to get  pregnant -breast-feeding How should I use this medicine? This medicine is for infusion into a vein. It is given by a health care professional in a hospital or clinic setting. Talk to your pediatrician regarding the use of this medicine in children. Special care may be needed. Overdosage: If you think you have taken too much of this medicine contact a poison control center or emergency room at once. NOTE: This medicine is only for you. Do not share this medicine with others. What if I miss a dose? It is important not to miss your dose. Call your doctor or health care professional if you are unable to keep an appointment. What may interact with this medicine? -certain antibiotics given by injection -NSAIDs, medicines for pain and inflammation, like ibuprofen or naproxen -some diuretics like bumetanide, furosemide -teriparatide -thalidomide This list may not describe all possible interactions. Give your health care provider a list of all the medicines, herbs, non-prescription drugs, or dietary supplements you use. Also tell them if you smoke, drink alcohol, or use illegal drugs. Some items may interact with your medicine. What should I watch for while using this medicine? Visit your doctor or health care professional for regular checkups. It may be some time before you see the benefit from this medicine. Do not stop taking your medicine unless your doctor tells you to. Your doctor may order blood tests or other tests to see how you are doing. Women should inform their doctor if they wish to become pregnant or think they might be pregnant. There is a potential for serious side effects to an unborn child. Talk to your health care professional or pharmacist for more information. You should make sure  that you get enough calcium and vitamin D while you are taking this medicine. Discuss the foods you eat and the vitamins you take with your health care professional. Some people who take this medicine have  severe bone, joint, and/or muscle pain. This medicine may also increase your risk for jaw problems or a broken thigh bone. Tell your doctor right away if you have severe pain in your jaw, bones, joints, or muscles. Tell your doctor if you have any pain that does not go away or that gets worse. Tell your dentist and dental surgeon that you are taking this medicine. You should not have major dental surgery while on this medicine. See your dentist to have a dental exam and fix any dental problems before starting this medicine. Take good care of your teeth while on this medicine. Make sure you see your dentist for regular follow-up appointments. What side effects may I notice from receiving this medicine? Side effects that you should report to your doctor or health care professional as soon as possible: -allergic reactions like skin rash, itching or hives, swelling of the face, lips, or tongue -anxiety, confusion, or depression -breathing problems -changes in vision -eye pain -feeling faint or lightheaded, falls -jaw pain, especially after dental work -mouth sores -muscle cramps, stiffness, or weakness -trouble passing urine or change in the amount of urine Side effects that usually do not require medical attention (report to your doctor or health care professional if they continue or are bothersome): -bone, joint, or muscle pain -constipation -diarrhea -fever -hair loss -irritation at site where injected -loss of appetite -nausea, vomiting -stomach upset -trouble sleeping -trouble swallowing -weak or tired This list may not describe all possible side effects. Call your doctor for medical advice about side effects. You may report side effects to FDA at 1-800-FDA-1088. Where should I keep my medicine? This drug is given in a hospital or clinic and will not be stored at home. NOTE: This sheet is a summary. It may not cover all possible information. If you have questions about this medicine,  talk to your doctor, pharmacist, or health care provider.  2015, [employer] Standard. (2013-04-17 13:03:13) Darbepoetin Alfa injection What is this medicine? DARBEPOETIN ALFA (dar be POE e tin AL fa) helps your body make more red blood cells. It is used to treat anemia caused by chronic kidney failure and chemotherapy. This medicine may be used for other purposes; ask your health care provider or pharmacist if you have questions. COMMON BRAND NAME(S): Aranesp What should I tell my health care provider before I take this medicine? They need to know if you have any of these conditions: -blood clotting disorders or history of blood clots -cancer patient not on chemotherapy -cystic fibrosis -heart disease, such as angina, heart failure, or a history of a heart attack -hemoglobin level of 12 g/dL or greater -high blood pressure -low levels of folate, iron, or vitamin B12 -seizures -an unusual or allergic reaction to darbepoetin, erythropoietin, albumin, hamster proteins, latex, other medicines, foods, dyes, or preservatives -pregnant or trying to get pregnant -breast-feeding How should I use this medicine? This medicine is for injection into a vein or under the skin. It is usually given by a health care professional in a hospital or clinic setting. If you get this medicine at home, you will be taught how to prepare and give this medicine. Do not shake the solution before you withdraw a dose. Use exactly as directed. Take your medicine at regular intervals. Do not  take your medicine more often than directed. It is important that you put your used needles and syringes in a special sharps container. Do not put them in a trash can. If you do not have a sharps container, call your pharmacist or healthcare provider to get one. Talk to your pediatrician regarding the use of this medicine in children. While this medicine may be used in children as young as 1 year for selected conditions, precautions  do apply. Overdosage: If you think you have taken too much of this medicine contact a poison control center or emergency room at once. NOTE: This medicine is only for you. Do not share this medicine with others. What if I miss a dose? If you miss a dose, take it as soon as you can. If it is almost time for your next dose, take only that dose. Do not take double or extra doses. What may interact with this medicine? Do not take this medicine with any of the following medications: -epoetin alfa This list may not describe all possible interactions. Give your health care provider a list of all the medicines, herbs, non-prescription drugs, or dietary supplements you use. Also tell them if you smoke, drink alcohol, or use illegal drugs. Some items may interact with your medicine. What should I watch for while using this medicine? Visit your prescriber or health care professional for regular checks on your progress and for the needed blood tests and blood pressure measurements. It is especially important for the doctor to make sure your hemoglobin level is in the desired range, to limit the risk of potential side effects and to give you the best benefit. Keep all appointments for any recommended tests. Check your blood pressure as directed. Ask your doctor what your blood pressure should be and when you should contact him or her. As your body makes more red blood cells, you may need to take iron, folic acid, or vitamin B supplements. Ask your doctor or health care provider which products are right for you. If you have kidney disease continue dietary restrictions, even though this medication can make you feel better. Talk with your doctor or health care professional about the foods you eat and the vitamins that you take. What side effects may I notice from receiving this medicine? Side effects that you should report to your doctor or health care professional as soon as possible: -allergic reactions like skin  rash, itching or hives, swelling of the face, lips, or tongue -breathing problems -changes in vision -chest pain -confusion, trouble speaking or understanding -feeling faint or lightheaded, falls -high blood pressure -muscle aches or pains -pain, swelling, warmth in the leg -rapid weight gain -severe headaches -sudden numbness or weakness of the face, arm or leg -trouble walking, dizziness, loss of balance or coordination -seizures (convulsions) -swelling of the ankles, feet, hands -unusually weak or tired Side effects that usually do not require medical attention (report to your doctor or health care professional if they continue or are bothersome): -diarrhea -fever, chills (flu-like symptoms) -headaches -nausea, vomiting -redness, stinging, or swelling at site where injected This list may not describe all possible side effects. Call your doctor for medical advice about side effects. You may report side effects to FDA at 1-800-FDA-1088. Where should I keep my medicine? Keep out of the reach of children. Store in a refrigerator between 2 and 8 degrees C (36 and 46 degrees F). Do not freeze. Do not shake. Throw away any unused portion if using a single-dose  vial. Throw away any unused medicine after the expiration date. NOTE: This sheet is a summary. It may not cover all possible information. If you have questions about this medicine, talk to your doctor, pharmacist, or health care provider.  2015, Elsevier/Gold Standard. (2008-10-20 10:23:57)

## 2014-11-19 NOTE — Addendum Note (Signed)
Addended by: Jaci Carrel A on: 11/19/2014 02:48 PM   Modules accepted: Medications

## 2014-11-24 ENCOUNTER — Encounter (HOSPITAL_COMMUNITY): Payer: Medicare Other

## 2014-11-24 ENCOUNTER — Ambulatory Visit (HOSPITAL_COMMUNITY): Payer: Medicare Other | Attending: Cardiovascular Disease | Admitting: Radiology

## 2014-11-24 DIAGNOSIS — I2510 Atherosclerotic heart disease of native coronary artery without angina pectoris: Secondary | ICD-10-CM | POA: Diagnosis not present

## 2014-11-24 DIAGNOSIS — R0602 Shortness of breath: Secondary | ICD-10-CM | POA: Diagnosis present

## 2014-11-24 DIAGNOSIS — IMO0001 Reserved for inherently not codable concepts without codable children: Secondary | ICD-10-CM

## 2014-11-24 DIAGNOSIS — I10 Essential (primary) hypertension: Secondary | ICD-10-CM | POA: Diagnosis not present

## 2014-11-24 DIAGNOSIS — Z951 Presence of aortocoronary bypass graft: Secondary | ICD-10-CM | POA: Insufficient documentation

## 2014-11-24 DIAGNOSIS — I255 Ischemic cardiomyopathy: Secondary | ICD-10-CM

## 2014-11-24 DIAGNOSIS — N289 Disorder of kidney and ureter, unspecified: Secondary | ICD-10-CM

## 2014-11-24 MED ORDER — TECHNETIUM TC 99M SESTAMIBI GENERIC - CARDIOLITE
30.0000 | Freq: Once | INTRAVENOUS | Status: AC | PRN
  Administered 2014-11-24: 30 via INTRAVENOUS

## 2014-11-24 MED ORDER — REGADENOSON 0.4 MG/5ML IV SOLN
0.4000 mg | Freq: Once | INTRAVENOUS | Status: AC
  Administered 2014-11-24: 0.4 mg via INTRAVENOUS

## 2014-11-24 MED ORDER — TECHNETIUM TC 99M SESTAMIBI GENERIC - CARDIOLITE
10.0000 | Freq: Once | INTRAVENOUS | Status: AC | PRN
  Administered 2014-11-24: 10 via INTRAVENOUS

## 2014-11-24 NOTE — Progress Notes (Signed)
[facility] [address] [place], [place] [address] [phone]    Cardiology Nuclear Med Study  FAWZI MELMAN is a 81 y.o. male     MRN : [id number]     DOB: 27-Jun-1928  Procedure Date: 11/24/2014  Nuclear Med Background Indication for Stress Test:  Evaluation for Ischemia and Graft Patency History:  CAD-CABG, 12/18/12 MPI: EF: 41% Scar minimal peri-infarct ischemia Cardiac Risk Factors: Hypertension  Symptoms:  SOB   Nuclear Pre-Procedure Caffeine/Decaff Intake:  None> 12 hrs NPO After: 7:30am   Lungs:  clear O2 Sat: 97% on room air. IV 0.9% NS with Angio Cath:  22g  IV Site: R Hand x 1, tolerated well IV Started by:  Irven Baltimore, RN  Chest Size (in):  46 Cup Size: n/a  Height: 5\' 10"  (1.778 m)  Weight:  193 lb (87.544 kg)  BMI:  Body mass index is 27.69 kg/(m^2). Tech Comments:  Patient took Carvedilol this am. Irven Baltimore, RN.    Nuclear Med Study 1 or 2 day study: 1 day  Stress Test Type:  Carlton Adam  Reading MD: N/A  Order Authorizing Provider:  Peter Martinique, MD  Resting Radionuclide: Technetium 14m Sestamibi  Resting Radionuclide Dose: 11.0 mCi   Stress Radionuclide:  Technetium 29m Sestamibi  Stress Radionuclide Dose: 33.0 mCi           Stress Protocol Rest HR: 56 Stress HR: 76  Rest BP: 136/54 Stress BP: 127/65  Exercise Time (min): n/a METS: n/a   Predicted Max HR: 136 bpm % Max HR: 55.88 bpm Rate Pressure Product: 10336   Dose of Adenosine (mg):  n/a Dose of Lexiscan: 0.4 mg  Dose of Atropine (mg): n/a Dose of Dobutamine: n/a mcg/kg/min (at max HR)  Stress Test Technologist: Perrin Maltese, EMT-P  Nuclear Technologist:  Earl Many, CNMT     Rest Procedure:  Myocardial perfusion imaging was performed at rest 45 minutes following the intravenous administration of Technetium 60m Sestamibi. Rest ECG: NSR - Normal EKG  Stress Procedure:  The patient received IV Lexiscan 0.4 mg over 15-seconds.  Technetium 108m  Sestamibi injected at 30-seconds. This patient had sob, chest fullness, and was flushed with the [facility] injection. Quantitative spect images were obtained after a 45 minute delay. Stress ECG: No significant change from baseline ECG and There are scattered PVCs.  QPS Raw Data Images:  Normal; no motion artifact; normal heart/lung ratio. Stress Images:  Fixed inferior and apical defects Rest Images:  Fixed inferior and apical defects Subtraction (SDS):  No evidence of ischemia. Transient Ischemic Dilatation (Normal <1.22):  1.19 Lung/Heart Ratio (Normal <0.45):  0.36  Quantitative Gated Spect Images QGS EDV:  175 ml QGS ESV:  127 ml  Impression Exercise Capacity:  Lexiscan with no exercise. BP Response:  Normal blood pressure response. Clinical Symptoms:  Mild chest pain/dyspnea. ECG Impression:  No significant ECG changes with Lexiscan. Comparison with Prior Nuclear Study: EF 41% with inferior scar  Overall Impression:  High risk stress nuclear study with moderate intensity, large size fixed inferior and apical scar. No reversible ischemia.  LV Ejection Fraction: 27%.  LV Wall Motion:  Global hypokinesis with inferior akinesis/dyskinesis   Pixie Casino, MD, [place] Certified in Nuclear Cardiology Attending Cardiologist [place]

## 2014-11-25 ENCOUNTER — Other Ambulatory Visit: Payer: Self-pay | Admitting: *Deleted

## 2014-11-25 ENCOUNTER — Encounter: Payer: Self-pay | Admitting: Physician Assistant

## 2014-11-25 DIAGNOSIS — R9439 Abnormal result of other cardiovascular function study: Secondary | ICD-10-CM

## 2014-11-26 ENCOUNTER — Ambulatory Visit: Payer: Medicare Other

## 2014-11-26 ENCOUNTER — Other Ambulatory Visit: Payer: Medicare Other

## 2014-12-01 ENCOUNTER — Other Ambulatory Visit (HOSPITAL_BASED_OUTPATIENT_CLINIC_OR_DEPARTMENT_OTHER): Payer: Medicare Other

## 2014-12-01 DIAGNOSIS — D630 Anemia in neoplastic disease: Secondary | ICD-10-CM

## 2014-12-01 DIAGNOSIS — L98499 Non-pressure chronic ulcer of skin of other sites with unspecified severity: Principal | ICD-10-CM

## 2014-12-01 DIAGNOSIS — I70209 Unspecified atherosclerosis of native arteries of extremities, unspecified extremity: Secondary | ICD-10-CM

## 2014-12-01 DIAGNOSIS — M869 Osteomyelitis, unspecified: Secondary | ICD-10-CM

## 2014-12-01 DIAGNOSIS — N289 Disorder of kidney and ureter, unspecified: Secondary | ICD-10-CM

## 2014-12-01 DIAGNOSIS — I739 Peripheral vascular disease, unspecified: Secondary | ICD-10-CM

## 2014-12-01 DIAGNOSIS — C9000 Multiple myeloma not having achieved remission: Secondary | ICD-10-CM

## 2014-12-01 LAB — CBC & DIFF AND RETIC
BASO%: 0.5 % (ref 0.0–2.0)
Basophils Absolute: 0 10*3/uL (ref 0.0–0.1)
EOS ABS: 0.2 10*3/uL (ref 0.0–0.5)
EOS%: 2.4 % (ref 0.0–7.0)
HEMATOCRIT: 30.3 % — AB (ref 38.4–49.9)
HEMOGLOBIN: 9.7 g/dL — AB (ref 13.0–17.1)
Immature Retic Fract: 9.9 % (ref 3.00–10.60)
LYMPH%: 22.4 % (ref 14.0–49.0)
MCH: 27.8 pg (ref 27.2–33.4)
MCHC: 32 g/dL (ref 32.0–36.0)
MCV: 86.8 fL (ref 79.3–98.0)
MONO#: 1.1 10*3/uL — AB (ref 0.1–0.9)
MONO%: 17.5 % — ABNORMAL HIGH (ref 0.0–14.0)
NEUT#: 3.6 10*3/uL (ref 1.5–6.5)
NEUT%: 57.2 % (ref 39.0–75.0)
PLATELETS: 157 10*3/uL (ref 140–400)
RBC: 3.49 10*6/uL — ABNORMAL LOW (ref 4.20–5.82)
RDW: 20.1 % — ABNORMAL HIGH (ref 11.0–14.6)
RETIC %: 2.42 % — AB (ref 0.80–1.80)
RETIC CT ABS: 84.46 10*3/uL (ref 34.80–93.90)
WBC: 6.2 10*3/uL (ref 4.0–10.3)
lymph#: 1.4 10*3/uL (ref 0.9–3.3)

## 2014-12-01 LAB — COMPREHENSIVE METABOLIC PANEL (CC13)
ALT: 13 U/L (ref 0–55)
ANION GAP: 5 meq/L (ref 3–11)
AST: 20 U/L (ref 5–34)
Albumin: 3.5 g/dL (ref 3.5–5.0)
Alkaline Phosphatase: 54 U/L (ref 40–150)
BILIRUBIN TOTAL: 0.68 mg/dL (ref 0.20–1.20)
BUN: 26.7 mg/dL — AB (ref 7.0–26.0)
CHLORIDE: 103 meq/L (ref 98–109)
CO2: 24 mEq/L (ref 22–29)
CREATININE: 1.2 mg/dL (ref 0.7–1.3)
Calcium: 8.6 mg/dL (ref 8.4–10.4)
EGFR: 58 mL/min/{1.73_m2} — AB (ref >90)
GLUCOSE: 101 mg/dL (ref 70–140)
Potassium: 4.5 mEq/L (ref 3.5–5.1)
Sodium: 133 mEq/L — ABNORMAL LOW (ref 136–145)
Total Protein: 6.3 g/dL — ABNORMAL LOW (ref 6.4–8.3)

## 2014-12-01 LAB — FERRITIN CHCC: Ferritin: 170 ng/ml (ref 22–316)

## 2014-12-03 LAB — PROTEIN ELECTROPHORESIS, SERUM
ALBUMIN ELP: 55.9 % (ref 55.8–66.1)
Alpha-1-Globulin: 5.8 % — ABNORMAL HIGH (ref 2.9–4.9)
Alpha-2-Globulin: 11.8 % (ref 7.1–11.8)
BETA GLOBULIN: 6.4 % (ref 4.7–7.2)
Beta 2: 5.2 % (ref 3.2–6.5)
Gamma Globulin: 14.9 % (ref 11.1–18.8)
TOTAL PROTEIN, SERUM ELECTROPHOR: 5.9 g/dL — AB (ref 6.0–8.3)

## 2014-12-03 LAB — KAPPA/LAMBDA LIGHT CHAINS
KAPPA FREE LGHT CHN: 74 mg/dL — AB (ref 0.33–1.94)
Kappa:Lambda Ratio: 31.36 — ABNORMAL HIGH (ref 0.26–1.65)
Lambda Free Lght Chn: 2.36 mg/dL (ref 0.57–2.63)

## 2014-12-03 LAB — PROTEIN / CREATININE RATIO, URINE
CREATININE, URINE: 53.2 mg/dL
Protein Creatinine Ratio: 0.36 — ABNORMAL HIGH (ref <0.15)
TOTAL PROTEIN, URINE: 19 mg/dL (ref 5–25)

## 2014-12-04 ENCOUNTER — Encounter: Payer: Self-pay | Admitting: Nurse Practitioner

## 2014-12-04 ENCOUNTER — Ambulatory Visit (HOSPITAL_BASED_OUTPATIENT_CLINIC_OR_DEPARTMENT_OTHER): Payer: Medicare Other | Admitting: Nurse Practitioner

## 2014-12-04 ENCOUNTER — Other Ambulatory Visit: Payer: Medicare Other

## 2014-12-04 ENCOUNTER — Ambulatory Visit: Payer: Medicare Other

## 2014-12-04 ENCOUNTER — Ambulatory Visit (HOSPITAL_BASED_OUTPATIENT_CLINIC_OR_DEPARTMENT_OTHER): Payer: Medicare Other

## 2014-12-04 VITALS — BP 150/78 | HR 62 | Temp 97.6°F | Resp 18 | Ht 70.0 in | Wt 200.3 lb

## 2014-12-04 DIAGNOSIS — Z5111 Encounter for antineoplastic chemotherapy: Secondary | ICD-10-CM

## 2014-12-04 DIAGNOSIS — C9000 Multiple myeloma not having achieved remission: Secondary | ICD-10-CM

## 2014-12-04 DIAGNOSIS — D630 Anemia in neoplastic disease: Secondary | ICD-10-CM | POA: Diagnosis not present

## 2014-12-04 DIAGNOSIS — E871 Hypo-osmolality and hyponatremia: Secondary | ICD-10-CM

## 2014-12-04 MED ORDER — ONDANSETRON 8 MG/50ML IVPB (CHCC)
8.0000 mg | Freq: Once | INTRAVENOUS | Status: AC
  Administered 2014-12-04: 8 mg via INTRAVENOUS

## 2014-12-04 MED ORDER — ONDANSETRON 8 MG/NS 50 ML IVPB
INTRAVENOUS | Status: AC
  Filled 2014-12-04: qty 8

## 2014-12-04 MED ORDER — SODIUM CHLORIDE 0.9 % IV SOLN
300.0000 mg/m2 | Freq: Once | INTRAVENOUS | Status: AC
  Administered 2014-12-04: 620 mg via INTRAVENOUS
  Filled 2014-12-04: qty 31

## 2014-12-04 MED ORDER — SODIUM CHLORIDE 0.9 % IV SOLN
Freq: Once | INTRAVENOUS | Status: AC
  Administered 2014-12-04: 12:00:00 via INTRAVENOUS

## 2014-12-04 MED ORDER — DARBEPOETIN ALFA 300 MCG/0.6ML IJ SOSY
300.0000 ug | PREFILLED_SYRINGE | Freq: Once | INTRAMUSCULAR | Status: AC
  Administered 2014-12-04: 300 ug via SUBCUTANEOUS
  Filled 2014-12-04: qty 0.6

## 2014-12-04 MED ORDER — DEXAMETHASONE SODIUM PHOSPHATE 10 MG/ML IJ SOLN
INTRAMUSCULAR | Status: AC
  Filled 2014-12-04: qty 1

## 2014-12-04 MED ORDER — DEXAMETHASONE SODIUM PHOSPHATE 10 MG/ML IJ SOLN
4.0000 mg | Freq: Once | INTRAMUSCULAR | Status: AC
  Administered 2014-12-04: 4 mg via INTRAVENOUS

## 2014-12-04 NOTE — Progress Notes (Signed)
ID: Timothy [name]   DOB: [birth year]  MR#: [id number]  EZM#:[id number]  PCP:  [facility]: OTHER MD: Dorna Leitz, Alysia Penna, Georgia Duff, Geryl Councilman  CHIEF COMPLAINT: multiple myeloma CURRENT THERAPY: cyclophosphamide, dexamethasone  MYELOMA HISTORY: From the earlier summary:  Patient presented in April 2007 with symptomatic slowly progressive anemia, with normal iron parameters, folic acid, and vitamin B12. Plans were made to obtain bone marrow aspirate and biopsy as an outpatient, however the patient was hospitalized shortly thereafter with a creatinine of 8.6. Renal biopsy demonstrated the presence of  light chain deposition disease. Bone marrow biopsy demonstrated 37% plasma cells. A skeletal survey showed small calvarial lytic lesions, mild osteopenia, and cervical spondylosis. With a well-established diagnosis of light-chain multiple myeloma Dr. Melodie Bouillon subsequent treatments are as detailed below.   INTERVAL HISTORY: Timothy [name] returns today with his wife, Timothy [name], for follow up of his multiple myeloma. Today is day 1 cycle 5 of his every 2 week Cytoxan treatments. He also takes dexamethasone 20 mg 2 days a week. Overall is is tolerating treatment well with few complaints. More than anything his left foot and lower back have been giving him more problems. He is prescribed $RemoveBefor'4mg'ecUFHamrdnsg$  dilaudid q6hrs, but he takes 2 tabs at once just daily. He chooses to "tough out" the rest of the day for fear of taking too much dilaudid. He has been more short of breath lately, and has a stress test and echocardiogram scheduled with Dr. Sung Amabile this upcoming Wednesday. He uses his walker to get around, and hasn't noticed any increased weakenss.   REVIEW OF SYSTEMS: Timothy [name] denies fevers, chills, nausea, vomiting, or changes in bowel or bladder habits. He denies mouth sores or rashes, bruising, or bleeding. He is eating and drinking about 2L daily. He is  up 7lb in the last 2 weeks, and is unsure why. His bilateral ankles swell frequently and he wears compression stockings daily. He is short of breath with exertion, but denies chest pain, cough, or palpitations. He sleeps well. A detailed review of systems is otherwise stable.  PAST MEDICAL HISTORY: Past Medical History  Diagnosis Date  . ASCVD (arteriosclerotic cardiovascular disease)   . Anemia     chronic mild anemia  . Gout   . Hypertension   . Lumbar disc disease     post laminectomy  . Chronic back pain   . History of echocardiogram 11/02/2010    EF range of 30 to 35% / There is hypokinsesis of the basal-mild inferolateral myocardium  . CHF (congestive heart failure)   . Heart murmur   . Ischemic cardiomyopathy   . Renal insufficiency   . Peripheral neuropathy   . Inferior myocardial infarction 1986  . Hyperlipidemia   . SOB (shortness of breath)   . Fatigue   . Peripheral neuropathy   . Coronary artery disease     CABG 2002 by Dr. Roxy Manns with LIMA to LAD, SVG to intermediate, SVG to LCX, & SVG to PL. Last nuclear in 2012 showing large inferior scar with EF of 39%.   . Blood transfusion     DEC 2012 - TWO UNITS  . Myeloma     Multiple myeloma, with recurrence of increasing problems  DR. [place] -ONCOLOGIST.  PT HAS BEEN OFF CHEMO SINCE HIS HOSPITALIZATION FEB 2013 FOR LEFT FOOT INFECTION  . Cancer   . History of shingles MARCH 2009    SHINGLE LESIONS WERE AROUND RIGHT EYE--PT HAS RESIDUAL ITCHING AROUND  THE EYE.  . Osteomyelitis     s/p toe amputation  . Chronic systolic heart failure     EF of 30% per echo 02/2012  . Protein malnutrition   . Critical lower limb ischemia   . Peripheral vascular disease   . Hx of cardiovascular stress test     Lexiscan Myoview (1/16):  Large inferior and apical scar; no ischemia, EF 27%;  High Risk   PAST SURGICAL HISTORY: Past Surgical History  Procedure Laterality Date  . Laminectomy    . Inguinal hernia repair    . Cardiac  catheterization  06/20/2000    Severe coronary disease (totally occluded right artery, 90% left circumflex, 50-60% intermediate, and 90-95% ostial left anterior descending)   . Tonsillectomy    . Coronary artery bypass graft  2002    x4 / Left internal mammary artery to the LAD.  / Saphenous vein graft to the right posterolateral.  /  Saphenous vein graft to  the ramus intermedius. / Saphenous vein graft to the circumflex marginal     . Amputation  03/06/2012    Procedure: AMPUTATION RAY;  Surgeon: Alta Corning, MD;  Location: [facility];  Service: Orthopedics;  Laterality: Left;  First Ray Amputation  . Intramedullary (im) nail intertrochanteric Right 08/17/2013    Procedure: INTRAMEDULLARY (IM) NAIL INTERTROCHANTRIC;  Surgeon: Marybelle Killings, MD;  Location: [facility];  Service: Orthopedics;  Laterality: Right;  . Endarterectomy femoral Left 03/24/2014    Procedure: Left Common Femoral  Artery Endarterectomy with Vein Patch Angioplasty,  Ultrasound ;  Surgeon: Angelia Mould, MD;  Location: [facility];  Service: Vascular;  Laterality: Left;  . Toe amputation    . 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 He and his wife Timothy [name] are the only ones at home.    ADVANCED DIRECTIVES:   HEALTH MAINTENANCE: History  Substance Use Topics  . Smoking status: Former Smoker -- 1.00 packs/day for 20 years    Types: Cigarettes    Quit date: 11/20/1984  . Smokeless tobacco: Never Used  . Alcohol Use: No     Colonoscopy:  Bone density:  Lipid panel:  Allergies  Allergen Reactions  . Morphine And Related Other (See Comments)    Agitated with morphine drip     Current Outpatient Prescriptions  Medication Sig Dispense Refill  . acyclovir (ZOVIRAX) 400 MG tablet Take 1 tablet (400 mg total) by mouth 2 (two) times daily. 60 tablet 3  . allopurinol (ZYLOPRIM) 300 MG tablet Take 0.5 tablets (150 mg total) by mouth daily. 90 tablet 3  . amLODipine (NORVASC) 2.5 MG tablet Take 1 tablet (2.5 mg total) by mouth daily. 90 tablet 3  . aspirin EC 81 MG tablet Take 81 mg by mouth 3 (three) times a week. Monday, Wednesday, Friday    . atorvastatin (LIPITOR) 10 MG tablet Take 1 tablet (10 mg total) by mouth every evening. 90 tablet 3  . B Complex-Biotin-FA (B COMPLETE PO) Take 1 tablet by mouth daily.     . carvedilol (COREG) 6.25 MG tablet Take 1 tablet (6.25 mg total) by mouth 2 (two) times daily with a meal. 180 tablet 3  . Coenzyme  Q10 (CO Q-10 PO) Take 1 tablet by mouth every morning.     . darbepoetin (ARANESP) 200 MCG/0.4ML SOLN injection Inject 200 mcg into the skin as directed.    Marland Kitchen dexamethasone (DECADRON) 4 MG tablet Take five tablets 2 consecutive days every week 40 tablet 6  . diphenoxylate-atropine (LOMOTIL) 2.5-0.025 MG per tablet Take 1 tablet by mouth 4 (four) times daily as needed for diarrhea or loose stools. 30 tablet 0  . HYDROmorphone (DILAUDID) 4 MG tablet Take 1 tablet (4 mg total) by mouth every 4 (four) hours as needed for severe pain. 30 tablet 0  . isosorbide mononitrate (IMDUR) 30 MG 24 hr tablet Take 1 tablet (30 mg total) by mouth daily. 30 tablet 3  . lisinopril (PRINIVIL,ZESTRIL) 20 MG tablet Take 20 mg by mouth daily.    . Multiple Vitamin (MULTIVITAMIN) capsule Take 1 capsule by mouth every morning.     . ondansetron (ZOFRAN) 8 MG tablet Take 1 tablet (8 mg total) by mouth 2 (two) times daily as needed (Nausea or vomiting). 30 tablet 1  . rOPINIRole (REQUIP) 3 MG tablet Take 1 tablet (3 mg total) by mouth at bedtime. 90 tablet 4  . tamsulosin (FLOMAX) 0.4 MG CAPS capsule Take 0.4 mg by mouth at bedtime.    Marland Kitchen tobramycin  (TOBREX) 0.3 % ophthalmic solution Place 1 drop into both eyes daily as needed (for Bacterial eye infection).     . vitamin C (ASCORBIC ACID) 500 MG tablet Take 500 mg by mouth every morning.    . nitroGLYCERIN (NITROSTAT) 0.4 MG SL tablet Place 1 tablet (0.4 mg total) under the tongue every 5 (five) minutes as needed for chest pain. (Patient not taking: Reported on 12/04/2014) 25 tablet 3  . polyethylene glycol (MIRALAX / GLYCOLAX) packet Take 17 g by mouth daily as needed. (Patient not taking: Reported on 12/04/2014) 14 each 0   No current facility-administered medications for this visit.    Objective: Older white man using a walker Filed Vitals:   12/04/14 1113  BP: 150/78  Pulse:   Temp:   Resp:         Body mass index is 28.74 kg/(m^2).    ECOG FS: 2 Filed Weights   12/04/14 1044  Weight: 200 lb 4.8 oz (90.855 kg)   Skin: warm, dry  HEENT: sclerae anicteric, conjunctivae pink, oropharynx clear. No thrush or mucositis.  Lymph Nodes: No cervical or supraclavicular lymphadenopathy  Lungs: clear to auscultation bilaterally, no rales, wheezes, or rhonci  Heart: regular rate and rhythm  Abdomen: round, soft, non tender, positive bowel sounds  Musculoskeletal: No focal spinal tenderness, +1 edema to bilateral lower extremities  Neuro: non focal, well oriented, positive affect   LAB RESULTS:  Results for SYLVIO, WEATHERALL (MRN [id number]) as of 12/04/2014 13:23  Ref. Range 07/31/2014 10:16 08/28/2014 13:16 09/25/2014 09:48 10/23/2014 13:08 12/01/2014 11:19  Kappa free light chain Latest Range: 0.33-1.94 mg/dL 33.17 (H) 42.13 (H) 213.94 (H) 62.30 (H) 74.00 (H)  Lambda Free Lght Chn Latest Range: 0.57-2.63 mg/dL 3.84 (H) 8.08 (H) 5.31 (H) 2.35 2.36  Kappa:Lambda Ratio Latest Range: 0.26-1.65  29.36 (H) 27.20 (H) 49.63 (H) 26.51 (H) 31.36 (H)     Lab Results  Component Value Date   WBC 6.2 12/01/2014   NEUTROABS 3.6 12/01/2014   HGB 9.7* 12/01/2014   HCT 30.3* 12/01/2014   MCV 86.8  12/01/2014   PLT 157 12/01/2014      Chemistry      Component  Value Date/Time   NA 133* 12/01/2014 1119   NA 126* 08/11/2014 1052   K 4.5 12/01/2014 1119   K 4.0 08/11/2014 1052   CL 96 08/11/2014 1052   CL 98 05/12/2013 1048   CO2 24 12/01/2014 1119   CO2 24 08/11/2014 1052   BUN 26.7* 12/01/2014 1119   BUN 12 08/11/2014 1052   CREATININE 1.2 12/01/2014 1119   CREATININE 1.0 08/11/2014 1052      Component Value Date/Time   CALCIUM 8.6 12/01/2014 1119   CALCIUM 9.0 08/11/2014 1052   ALKPHOS 54 12/01/2014 1119   ALKPHOS 42 03/25/2014 2115   AST 20 12/01/2014 1119   AST 25 03/25/2014 2115   ALT 13 12/01/2014 1119   ALT 10 03/25/2014 2115   BILITOT 0.68 12/01/2014 1119   BILITOT 0.7 03/25/2014 2115      STUDIES: No results found.  ASSESSMENT: 81 y.o.  with kappa light chain multiple myeloma   (1) presenting April 2007 with anemia and renal failure; renal Bx showing free kappa light chain deposition; bone marrow biopsy showing 37% plasma cells, with normal cytogenetics and FISH;bone survey showing skull lytic lesions; treated with   (2) thalidomide (200 mg/d) and dexamethasone (40 mg/d x4d Q28d) June through Nov 2007, with good response, but poor tolerance;   (3) thalidomide decreased to 100 mg/d, dexamethasone continued, bortezomib (IV) added, Dec 2007 to March 2008   (4) treatment interrupted by multiple complications (peripheral neuropathy, pulmonary embolism, diverticular abscess, CN V zoster, severe DDD, congestive heart failure, rising PSA); maintenance zolendronic acid through March 2012   (5) progression April 2012, treated initially with dexamethasone alone, poorly tolerated;   (6) bortezomib (sq) resumed July 2012; cyclophosphamide and dexamethasone added Sept 2012; zolendronic acid changed to Q 14months; all treatments held as of March 2013 due to the development of the Left foot ulcer described above  (7) anemia, on aranesp December 2012 to August 2013;  resumed Q14d starting May 2015, with slowly rising reticulocyte count  (8) status post Right trochanteric nail with proximal and distal interlock DePuy 11 mm one-pin lag screw, 44 mm distal interlock 08/09/2013 for a right comminuted intertrochanteric fracture  (9) status post removal of the first 2 toes left foot  (10) Left common femoral artery endarterectomy with vein patch angioplasty using left greater saphenous vein 03/24/2014  (11) status post fall with injury to the right hip, large hematoma, requiring rehabilitation at [facility] until 07/30/2014  (12) on 10/09/2014 starting cyclophosphamide at 300 mg/m every 14 days and dexamethasone 20 mg daily 2 days each week  (13) SIADH: On 2 L a day fluid restriction, reduced to 1.5L on 12/04/14 because of drop in sodium  Results for CORBET, HANLEY (MRN [id number]) as of 12/04/2014 13:23  Ref. Range 08/28/2014 13:16 09/25/2014 09:48 10/23/2014 13:08 11/06/2014 10:35 12/01/2014 11:19  Sodium Latest Range: 136-145 mEq/L 133 (L) 136 136 137 133 (L)   PLAN:   The labs were reviewed in detail and were some changes. Since increasing his aranesp dose, his hgb is up to 9.7. Also, his sodium is down to 133, so I advised he drop his fluid restriction from 2L to 1.5 to see if this can be managed better. There is also an increase in his kappa:lambda ratio which I discussed with Dr. [name]. Nevertheless he will proceed with cycle 5 of cytoxan today. He will continue the 20 mg dexamethasone twice weekly as prescribed.   I have written him a prescription to refill his diluadid.  He will continue to use this for his foot and back pain.   Timothy [name] will follow up with cardiology next week. In 2 weeks he will return here for labs and cycle 6 of cyclophosphamide. He understands and agrees with this plan. He has been encouraged to call with any  Issues that might arise before his next visit here.   Marcelino Duster    12/04/2014

## 2014-12-04 NOTE — Patient Instructions (Signed)
[facility] Discharge Instructions for Patients Receiving Chemotherapy  Today you received the following chemotherapy agents: Cytoxan  To help prevent nausea and vomiting after your treatment, we encourage you to take your nausea medication as prescribed by your physician.   If you develop nausea and vomiting that is not controlled by your nausea medication, call the clinic.   BELOW ARE SYMPTOMS THAT SHOULD BE REPORTED IMMEDIATELY:  *FEVER GREATER THAN 100.5 F  *CHILLS WITH OR WITHOUT FEVER  NAUSEA AND VOMITING THAT IS NOT CONTROLLED WITH YOUR NAUSEA MEDICATION  *UNUSUAL SHORTNESS OF BREATH  *UNUSUAL BRUISING OR BLEEDING  TENDERNESS IN MOUTH AND THROAT WITH OR WITHOUT PRESENCE OF ULCERS  *URINARY PROBLEMS  *BOWEL PROBLEMS  UNUSUAL RASH Items with * indicate a potential emergency and should be followed up as soon as possible.  Feel free to call the clinic you have any questions or concerns. The clinic phone number is [phone].   Zoledronic Acid injection (Hypercalcemia, Oncology) What is this medicine? ZOLEDRONIC ACID (ZOE le dron ik AS id) lowers the amount of calcium loss from bone. It is used to treat too much calcium in your blood from cancer. It is also used to prevent complications of cancer that has spread to the bone. This medicine may be used for other purposes; ask your health care provider or pharmacist if you have questions. COMMON BRAND NAME(S): Zometa What should I tell my health care provider before I take this medicine? They need to know if you have any of these conditions: -aspirin-sensitive asthma -cancer, especially if you are receiving medicines used to treat cancer -dental disease or wear dentures -infection -kidney disease -receiving corticosteroids like dexamethasone or prednisone -an unusual or allergic reaction to zoledronic acid, other medicines, foods, dyes, or preservatives -pregnant or trying to get  pregnant -breast-feeding How should I use this medicine? This medicine is for infusion into a vein. It is given by a health care professional in a hospital or clinic setting. Talk to your pediatrician regarding the use of this medicine in children. Special care may be needed. Overdosage: If you think you have taken too much of this medicine contact a poison control center or emergency room at once. NOTE: This medicine is only for you. Do not share this medicine with others. What if I miss a dose? It is important not to miss your dose. Call your doctor or health care professional if you are unable to keep an appointment. What may interact with this medicine? -certain antibiotics given by injection -NSAIDs, medicines for pain and inflammation, like ibuprofen or naproxen -some diuretics like bumetanide, furosemide -teriparatide -thalidomide This list may not describe all possible interactions. Give your health care provider a list of all the medicines, herbs, non-prescription drugs, or dietary supplements you use. Also tell them if you smoke, drink alcohol, or use illegal drugs. Some items may interact with your medicine. What should I watch for while using this medicine? Visit your doctor or health care professional for regular checkups. It may be some time before you see the benefit from this medicine. Do not stop taking your medicine unless your doctor tells you to. Your doctor may order blood tests or other tests to see how you are doing. Women should inform their doctor if they wish to become pregnant or think they might be pregnant. There is a potential for serious side effects to an unborn child. Talk to your health care professional or pharmacist for more information. You should make sure  that you get enough calcium and vitamin D while you are taking this medicine. Discuss the foods you eat and the vitamins you take with your health care professional. Some people who take this medicine have  severe bone, joint, and/or muscle pain. This medicine may also increase your risk for jaw problems or a broken thigh bone. Tell your doctor right away if you have severe pain in your jaw, bones, joints, or muscles. Tell your doctor if you have any pain that does not go away or that gets worse. Tell your dentist and dental surgeon that you are taking this medicine. You should not have major dental surgery while on this medicine. See your dentist to have a dental exam and fix any dental problems before starting this medicine. Take good care of your teeth while on this medicine. Make sure you see your dentist for regular follow-up appointments. What side effects may I notice from receiving this medicine? Side effects that you should report to your doctor or health care professional as soon as possible: -allergic reactions like skin rash, itching or hives, swelling of the face, lips, or tongue -anxiety, confusion, or depression -breathing problems -changes in vision -eye pain -feeling faint or lightheaded, falls -jaw pain, especially after dental work -mouth sores -muscle cramps, stiffness, or weakness -trouble passing urine or change in the amount of urine Side effects that usually do not require medical attention (report to your doctor or health care professional if they continue or are bothersome): -bone, joint, or muscle pain -constipation -diarrhea -fever -hair loss -irritation at site where injected -loss of appetite -nausea, vomiting -stomach upset -trouble sleeping -trouble swallowing -weak or tired This list may not describe all possible side effects. Call your doctor for medical advice about side effects. You may report side effects to FDA at 1-800-FDA-1088. Where should I keep my medicine? This drug is given in a hospital or clinic and will not be stored at home. NOTE: This sheet is a summary. It may not cover all possible information. If you have questions about this medicine,  talk to your doctor, pharmacist, or health care provider.  2015, [employer]. (2013-04-17 13:03:13) Darbepoetin Alfa injection What is this medicine? DARBEPOETIN ALFA (dar be POE e tin AL fa) helps your body make more red blood cells. It is used to treat anemia caused by chronic kidney failure and chemotherapy. This medicine may be used for other purposes; ask your health care provider or pharmacist if you have questions. COMMON BRAND NAME(S): Aranesp What should I tell my health care provider before I take this medicine? They need to know if you have any of these conditions: -blood clotting disorders or history of blood clots -cancer patient not on chemotherapy -cystic fibrosis -heart disease, such as angina, heart failure, or a history of a heart attack -hemoglobin level of 12 g/dL or greater -high blood pressure -low levels of folate, iron, or vitamin B12 -seizures -an unusual or allergic reaction to darbepoetin, erythropoietin, albumin, hamster proteins, latex, other medicines, foods, dyes, or preservatives -pregnant or trying to get pregnant -breast-feeding How should I use this medicine? This medicine is for injection into a vein or under the skin. It is usually given by a health care professional in a hospital or clinic setting. If you get this medicine at home, you will be taught how to prepare and give this medicine. Do not shake the solution before you withdraw a dose. Use exactly as directed. Take your medicine at regular intervals. Do not  take your medicine more often than directed. It is important that you put your used needles and syringes in a special sharps container. Do not put them in a trash can. If you do not have a sharps container, call your pharmacist or healthcare provider to get one. Talk to your pediatrician regarding the use of this medicine in children. While this medicine may be used in children as young as 1 year for selected conditions, precautions  do apply. Overdosage: If you think you have taken too much of this medicine contact a poison control center or emergency room at once. NOTE: This medicine is only for you. Do not share this medicine with others. What if I miss a dose? If you miss a dose, take it as soon as you can. If it is almost time for your next dose, take only that dose. Do not take double or extra doses. What may interact with this medicine? Do not take this medicine with any of the following medications: -epoetin alfa This list may not describe all possible interactions. Give your health care provider a list of all the medicines, herbs, non-prescription drugs, or dietary supplements you use. Also tell them if you smoke, drink alcohol, or use illegal drugs. Some items may interact with your medicine. What should I watch for while using this medicine? Visit your prescriber or health care professional for regular checks on your progress and for the needed blood tests and blood pressure measurements. It is especially important for the doctor to make sure your hemoglobin level is in the desired range, to limit the risk of potential side effects and to give you the best benefit. Keep all appointments for any recommended tests. Check your blood pressure as directed. Ask your doctor what your blood pressure should be and when you should contact him or her. As your body makes more red blood cells, you may need to take iron, folic acid, or vitamin B supplements. Ask your doctor or health care provider which products are right for you. If you have kidney disease continue dietary restrictions, even though this medication can make you feel better. Talk with your doctor or health care professional about the foods you eat and the vitamins that you take. What side effects may I notice from receiving this medicine? Side effects that you should report to your doctor or health care professional as soon as possible: -allergic reactions like skin  rash, itching or hives, swelling of the face, lips, or tongue -breathing problems -changes in vision -chest pain -confusion, trouble speaking or understanding -feeling faint or lightheaded, falls -high blood pressure -muscle aches or pains -pain, swelling, warmth in the leg -rapid weight gain -severe headaches -sudden numbness or weakness of the face, arm or leg -trouble walking, dizziness, loss of balance or coordination -seizures (convulsions) -swelling of the ankles, feet, hands -unusually weak or tired Side effects that usually do not require medical attention (report to your doctor or health care professional if they continue or are bothersome): -diarrhea -fever, chills (flu-like symptoms) -headaches -nausea, vomiting -redness, stinging, or swelling at site where injected This list may not describe all possible side effects. Call your doctor for medical advice about side effects. You may report side effects to FDA at 1-800-FDA-1088. Where should I keep my medicine? Keep out of the reach of children. Store in a refrigerator between 2 and 8 degrees C (36 and 46 degrees F). Do not freeze. Do not shake. Throw away any unused portion if using a single-dose  vial. Throw away any unused medicine after the expiration date. NOTE: This sheet is a summary. It may not cover all possible information. If you have questions about this medicine, talk to your doctor, pharmacist, or health care provider.  2015, [employer]. (2008-10-20 10:23:57)

## 2014-12-09 ENCOUNTER — Ambulatory Visit (HOSPITAL_COMMUNITY): Payer: Medicare Other | Attending: Cardiovascular Disease | Admitting: Radiology

## 2014-12-09 ENCOUNTER — Encounter: Payer: Self-pay | Admitting: Nurse Practitioner

## 2014-12-09 ENCOUNTER — Ambulatory Visit (INDEPENDENT_AMBULATORY_CARE_PROVIDER_SITE_OTHER): Payer: Medicare Other | Admitting: Nurse Practitioner

## 2014-12-09 VITALS — BP 140/68 | HR 83 | Ht 70.0 in | Wt 194.1 lb

## 2014-12-09 DIAGNOSIS — N289 Disorder of kidney and ureter, unspecified: Secondary | ICD-10-CM

## 2014-12-09 DIAGNOSIS — R9439 Abnormal result of other cardiovascular function study: Secondary | ICD-10-CM | POA: Insufficient documentation

## 2014-12-09 DIAGNOSIS — IMO0001 Reserved for inherently not codable concepts without codable children: Secondary | ICD-10-CM

## 2014-12-09 DIAGNOSIS — I10 Essential (primary) hypertension: Secondary | ICD-10-CM

## 2014-12-09 DIAGNOSIS — I255 Ischemic cardiomyopathy: Secondary | ICD-10-CM

## 2014-12-09 DIAGNOSIS — R0602 Shortness of breath: Secondary | ICD-10-CM

## 2014-12-09 MED ORDER — ISOSORBIDE MONONITRATE ER 30 MG PO TB24: 30.0000 mg | ORAL_TABLET | Freq: Every day | ORAL | Status: DC

## 2014-12-09 MED ORDER — FUROSEMIDE 40 MG PO TABS: 40.0000 mg | ORAL_TABLET | Freq: Every day | ORAL | Status: DC

## 2014-12-09 NOTE — Progress Notes (Signed)
Echocardiogram performed.  

## 2014-12-09 NOTE — Patient Instructions (Signed)
Stay on your current medicines  I refilled the Imdur to [facility]  I am starting you on Lasix 40 mg to take one today and one early tomorrow morning.   See Dr. [place] tomorrow at 1:45 for further discussion  Call the [place] office at [phone] if you have any questions, problems or concerns.

## 2014-12-09 NOTE — Progress Notes (Signed)
CARDIOLOGY OFFICE NOTE  Date:  12/09/2014    Timothy [name] Date of Birth: March 12, 1928 Medical Record #[id number]  PCP:  [name], MD  Cardiologist:  [place]    Chief Complaint  Patient presents with  . Cardiomyopathy    Follow up visit - seen for Dr. Martinique     History of Present Illness: Timothy [name] is a 81 y.o. male who presents today for a follow up month visit. Seen for Dr. Martinique. He has a history of CAD, status post CABG in 2002, ischemic cardiomyopathy - EF of 35 to 40% per echo from August of 9833, chronic systolic CHF, multiple myeloma - followed closely by oncology, prior to amputations for osteomyelitis, CKD, HL, anemia, depression, neuropathy, gout. Lexiscan Myoview (12/18/12): Inf and IL scar with minimal peri-infarct ischemia, EF 41%; Intermediate Risk. Med Rx continued.   Last seen by Dr. Martinique 07/2013. He was given Lasix for one week due to some volume excess. Saw Richardson Dopp, [place] back in March of 2015. He is been followed by podiatry for left second toe ulcer as well as left heel ulcer. ABIs 01/2014 were abnormal with suggestion of significant inflow disease on the left by CFA wave forms and greater than 50% left SFA disease; distal right SFA with short area of occlusion versus calcific shadowing. He has seen Dr. Gwenlyn Found and Dr. Scot Dock and has had left common femoral endarterectomy and patch angioplasty.   I saw him back in early August - he was doing ok - was falling and very limited by his balance. Sodium was low. It improved with fluid restriction.   I saw him back in October - he had fallen again, ended up at rehab and had been released several weeks prior to the visit with me. BP now running high. Wife felt like breathing is shallow. He has been nauseated and dizzy. Had been put on thyroid medicine - but what was interesting was that his TSH was normal on 07/06/14 and then 7 on 07/07/14. He has never had thyroid issues in the past. T4 normal. Repeat study  here was totally normal. He was doing better at his last follow up visit.   Seen just prior to Christmas - he was declining. Short of breath and fatigued. Got his Myoview updated - EF down to 27% and then referred for echo which was done today. Reading is pending but preliminary looks like it correlates with the EF by Myoview.   Comes in today. Here with [name] today. He continues to decline. He remains short of breath. He is fatigued. No real change with the Imdur that I added. No chest pain. Some swelling in his legs. Weight is labile. Remains on Decadron and Cytoxan for his multiple myeloma. He is frustrated and "just wants to be doing better".   Past Medical History  Diagnosis Date  . ASCVD (arteriosclerotic cardiovascular disease)   . Anemia     chronic mild anemia  . Gout   . Hypertension   . Lumbar disc disease     post laminectomy  . Chronic back pain   . History of echocardiogram 11/02/2010    EF range of 30 to 35% / There is hypokinsesis of the basal-mild inferolateral myocardium  . CHF (congestive heart failure)   . Heart murmur   . Ischemic cardiomyopathy   . Renal insufficiency   . Peripheral neuropathy   . Inferior myocardial infarction 1986  . Hyperlipidemia   . SOB (shortness of  breath)   . Fatigue   . Peripheral neuropathy   . Coronary artery disease     CABG 2002 by Dr. Roxy Manns with LIMA to LAD, SVG to intermediate, SVG to LCX, & SVG to PL. Last nuclear in 2012 showing large inferior scar with EF of 39%.   . Blood transfusion     DEC 2012 - TWO UNITS  . Myeloma     Multiple myeloma, with recurrence of increasing problems  DR. [place] -ONCOLOGIST.  PT HAS BEEN OFF CHEMO SINCE HIS HOSPITALIZATION FEB 2013 FOR LEFT FOOT INFECTION  . Cancer   . History of shingles MARCH 2009    SHINGLE LESIONS WERE AROUND RIGHT EYE--PT HAS RESIDUAL ITCHING AROUND THE EYE.  . Osteomyelitis     s/p toe amputation  . Chronic systolic heart failure     EF of 30% per echo 02/2012  .  Protein malnutrition   . Critical lower limb ischemia   . Peripheral vascular disease   . Hx of cardiovascular stress test     Lexiscan Myoview (1/16):  Large inferior and apical scar; no ischemia, EF 27%;  High Risk    Past Surgical History  Procedure Laterality Date  . Laminectomy    . Inguinal hernia repair    . Cardiac catheterization  06/20/2000    Severe coronary disease (totally occluded right artery, 90% left circumflex, 50-60% intermediate, and 90-95% ostial left anterior descending)   . Tonsillectomy    . Coronary artery bypass graft  2002    x4 / Left internal mammary artery to the LAD.  / Saphenous vein graft to the right posterolateral.  /  Saphenous vein graft to  the ramus intermedius. / Saphenous vein graft to the circumflex marginal     . Amputation  03/06/2012    Procedure: AMPUTATION RAY;  Surgeon: Alta Corning, MD;  Location: [facility];  Service: Orthopedics;  Laterality: Left;  First Ray Amputation  . Intramedullary (im) nail intertrochanteric Right 08/17/2013    Procedure: INTRAMEDULLARY (IM) NAIL INTERTROCHANTRIC;  Surgeon: Marybelle Killings, MD;  Location: [facility];  Service: Orthopedics;  Laterality: Right;  . Endarterectomy femoral Left 03/24/2014    Procedure: Left Common Femoral  Artery Endarterectomy with Vein Patch Angioplasty,  Ultrasound ;  Surgeon: Angelia Mould, MD;  Location: [place];  Service: Vascular;  Laterality: Left;  . Toe amputation    . Lower extremity angiogram N/A 03/23/2014    Procedure: LOWER EXTREMITY ANGIOGRAM;  Surgeon: Lorretta Harp, MD;  Location: [facility] CATH [facility];  Service: Cardiovascular;  Laterality: N/A;     Medications: Current Outpatient Prescriptions  Medication Sig Dispense Refill  . acyclovir (ZOVIRAX) 400 MG tablet Take 1 tablet (400 mg total) by mouth 2 (two) times daily. 60 tablet 3  . allopurinol (ZYLOPRIM) 300 MG tablet Take 0.5 tablets (150 mg total) by mouth daily. 90 tablet 3  . amLODipine (NORVASC) 2.5 MG tablet Take 1  tablet (2.5 mg total) by mouth daily. 90 tablet 3  . aspirin EC 81 MG tablet Take 81 mg by mouth 3 (three) times a week. Monday, Wednesday, Friday    . atorvastatin (LIPITOR) 10 MG tablet Take 1 tablet (10 mg total) by mouth every evening. 90 tablet 3  . B Complex-Biotin-FA (B COMPLETE PO) Take 1 tablet by mouth daily.     . carvedilol (COREG) 6.25 MG tablet Take 1 tablet (6.25 mg total) by mouth 2 (two) times daily with a meal. 180 tablet 3  .  Coenzyme Q10 (CO Q-10 PO) Take 1 tablet by mouth every morning.     . darbepoetin (ARANESP) 200 MCG/0.4ML SOLN injection Inject 200 mcg into the skin as directed.    Marland Kitchen dexamethasone (DECADRON) 4 MG tablet Take five tablets 2 consecutive days every week 40 tablet 6  . diphenoxylate-atropine (LOMOTIL) 2.5-0.025 MG per tablet Take 1 tablet by mouth 4 (four) times daily as needed for diarrhea or loose stools. 30 tablet 0  . HYDROmorphone (DILAUDID) 4 MG tablet Take 1 tablet (4 mg total) by mouth every 4 (four) hours as needed for severe pain. 30 tablet 0  . isosorbide mononitrate (IMDUR) 30 MG 24 hr tablet Take 1 tablet (30 mg total) by mouth daily. 30 tablet 3  . lisinopril (PRINIVIL,ZESTRIL) 20 MG tablet Take 20 mg by mouth daily.    . Multiple Vitamin (MULTIVITAMIN) capsule Take 1 capsule by mouth every morning.     . nitroGLYCERIN (NITROSTAT) 0.4 MG SL tablet Place 1 tablet (0.4 mg total) under the tongue every 5 (five) minutes as needed for chest pain. 25 tablet 3  . ondansetron (ZOFRAN) 8 MG tablet Take 1 tablet (8 mg total) by mouth 2 (two) times daily as needed (Nausea or vomiting). 30 tablet 1  . polyethylene glycol (MIRALAX / GLYCOLAX) packet Take 17 g by mouth daily as needed. 14 each 0  . rOPINIRole (REQUIP) 3 MG tablet Take 1 tablet (3 mg total) by mouth at bedtime. 90 tablet 4  . tamsulosin (FLOMAX) 0.4 MG CAPS capsule Take 0.4 mg by mouth at bedtime.    Marland Kitchen tobramycin (TOBREX) 0.3 % ophthalmic solution Place 1 drop into both eyes daily as needed  (for Bacterial eye infection).     . vitamin C (ASCORBIC ACID) 500 MG tablet Take 500 mg by mouth every morning.     No current facility-administered medications for this visit.    Allergies: Allergies  Allergen Reactions  . Morphine And Related Other (See Comments)    Agitated with morphine drip    Social History: The patient  reports that he quit smoking about 30 years ago. His smoking use included Cigarettes. He has a 20 pack-year smoking history. He has never used smokeless tobacco. He reports that he does not drink alcohol or use illicit drugs.   Family History: The patient's family history includes Heart attack in his mother; Heart disease in his father.   Review of Systems: Please see the history of present illness.   Otherwise, the review of systems is positive for .   All other systems are reviewed and negative.   Physical Exam: VS:  BP 140/68 mmHg  Pulse 83  Ht $R'5\' 10"'vW$  (1.778 m)  Wt 194 lb 1.9 oz (88.052 kg)  BMI 27.85 kg/m2  SpO2 93% .  BMI Body mass index is 27.85 kg/(m^2). General: Pleasant but looks frail. He is in no acute distress.  HEENT: Normal. Neck: Supple, no JVD.  Cardiac: Regular rate and rhythm. Heart tones are distant. +1edema in his legs.  Respiratory:  Lungs are clear to auscultation bilaterally with normal work of breathing.  GI: Soft and nontender. Not bloated MS: No deformity or atrophy. Gait and ROM intact. Using his walker Skin: Warm and dry. Color is sallow. Neuro:  Strength and sensation are intact and no gross focal deficits noted.  Psych: Alert, appropriate and with normal affect.   Wt Readings from Last 3 Encounters:  12/09/14 194 lb 1.9 oz (88.052 kg)  12/04/14 200 lb 4.8 oz (  90.855 kg)  11/24/14 193 lb (87.544 kg)    LABORATORY DATA:  EKG:  EKG is not ordered today.   Lab Results  Component Value Date   WBC 6.2 12/01/2014   HGB 9.7* 12/01/2014   HCT 30.3* 12/01/2014   PLT 157 12/01/2014   GLUCOSE 101 12/01/2014   CHOL  102 08/13/2013   TRIG 75 08/13/2013   HDL 37* 08/13/2013   LDLCALC 50 08/13/2013   ALT 13 12/01/2014   AST 20 12/01/2014   NA 133* 12/01/2014   K 4.5 12/01/2014   CL 96 08/11/2014   CREATININE 1.2 12/01/2014   BUN 26.7* 12/01/2014   CO2 24 12/01/2014   TSH 3.01 11/11/2014   PSA 3.75 05/30/2006   INR 1.15 03/23/2014    BNP (last 3 results)  Recent Labs  06/22/14 1146 11/11/14 1151  PROBNP 88.0 138.0*    Other Studies Reviewed Today:  Myoview Impression from 11/2014 Exercise Capacity: Lexiscan with no exercise. BP Response: Normal blood pressure response. Clinical Symptoms: Mild chest pain/dyspnea. ECG Impression: No significant ECG changes with Lexiscan. Comparison with Prior Nuclear Study: EF 41% with inferior scar  Overall Impression: High risk stress nuclear study with moderate intensity, large size fixed inferior and apical scar. No reversible ischemia.  LV Ejection Fraction: 27%. LV Wall Motion: Global hypokinesis with inferior akinesis/dyskinesis   Pixie Casino, MD, [place] Certified in Nuclear Cardiology Attending Cardiologist [place]   Echo from earlier this am pending  Assessment/Plan: 1. Shortness of breath - may certainly be his anginal equivalent - his Myoview shows large scar but no ischemia. His grafts are 79 years old. Echo pending. I have favored medical management but [name] would like to consider cardiac cath. I do not feel comfortable making that decision given all of his other health issues.  Will keep him on the Imdur. I have given him a RX for Lasix 40 mg to take today and tomorrow am. Will need to have close follow up of his labs - he has a tendency towards hyponatremia if we need to continue longer.   2. ?Hypothyroidism - he has never had issues with his thyroid. Recheck twice has been totally normal  3. Multiple myeloma - back on treatment.   4. Hyponatremia - followed by renal - last level earlier this month was  normal.  5. Known CAD with remote CABG from 2002 - he is willing to proceed with cardiac cath and possible PCI for symptom relief.  I have told [name] and [name] that I need to get Dr. Doug Sou input. We need to decide if we are going to proceed with invasive testing which will come with some risk versus conservative management.  [name] has an appointment there tomorrow with him that she would like to give to [place]. The echo should have been read by then. Further disposition to follow.   Current medicines are reviewed with the patient today.  The patient does not have concerns regarding medicines.  The following changes have been made:  See above  Labs/ tests ordered today include: NA   No orders of the defined types were placed in this encounter.   Disposition:   FU with Dr. Martinique in 1 day.  Patient is agreeable to this plan and will call if any problems develop in the interim.   Signed: Burtis Junes, RN, ANP-C 12/09/2014 11:41 AM  [facility] [address] [place], [place]  [address] Phone: [phone]  Fax: [phone]

## 2014-12-10 ENCOUNTER — Ambulatory Visit: Payer: Medicare Other

## 2014-12-10 ENCOUNTER — Ambulatory Visit (INDEPENDENT_AMBULATORY_CARE_PROVIDER_SITE_OTHER): Payer: Medicare Other | Admitting: Cardiology

## 2014-12-10 ENCOUNTER — Other Ambulatory Visit: Payer: Medicare Other

## 2014-12-10 ENCOUNTER — Encounter: Payer: Self-pay | Admitting: Cardiology

## 2014-12-10 VITALS — BP 122/76 | HR 84 | Ht 70.0 in | Wt 192.0 lb

## 2014-12-10 DIAGNOSIS — Z951 Presence of aortocoronary bypass graft: Secondary | ICD-10-CM

## 2014-12-10 DIAGNOSIS — I255 Ischemic cardiomyopathy: Secondary | ICD-10-CM

## 2014-12-10 DIAGNOSIS — I25810 Atherosclerosis of coronary artery bypass graft(s) without angina pectoris: Secondary | ICD-10-CM

## 2014-12-10 DIAGNOSIS — D630 Anemia in neoplastic disease: Secondary | ICD-10-CM

## 2014-12-10 DIAGNOSIS — I5023 Acute on chronic systolic (congestive) heart failure: Secondary | ICD-10-CM

## 2014-12-10 NOTE — Progress Notes (Signed)
Timothy [name] Date of Birth: Jan 06, 1928 Medical Record #[id number]  History of Present Illness: Timothy [name] is seen back today for follow up evaluation of dyspnea. He has a complex medical history which includes an ischemic CM with an EF of 35%. He has multiple myeloma with progressive anemia and is followed closely by oncology. He is on Decadron and Cytoxan. Other problems include HLD, depression, remote CABG in 2002, CKD, neuropathy, anemia and gout. He also has a history of hyponatremia.  He developed critical limb ischemia and underwent left femoral endarterectomy and patch angioplasty in May 2015. Over the past 2-3 months he has developed dyspnea on exertion. He denies any chest pain or palpitations. No PND or orthopnea. Mild ankle swelling. Echo and Myoview studies were done as noted below. He was tried on isosorbide without improvement. He was started on lasix this past week at 40 mg. He notes a good diuretic response and wants to cut back to 20 mg.   Current Outpatient Prescriptions on File Prior to Visit  Medication Sig Dispense Refill  . acyclovir (ZOVIRAX) 400 MG tablet Take 1 tablet (400 mg total) by mouth 2 (two) times daily. 60 tablet 3  . allopurinol (ZYLOPRIM) 300 MG tablet Take 0.5 tablets (150 mg total) by mouth daily. 90 tablet 3  . amLODipine (NORVASC) 2.5 MG tablet Take 1 tablet (2.5 mg total) by mouth daily. 90 tablet 3  . aspirin EC 81 MG tablet Take 81 mg by mouth 3 (three) times a week. Monday, Wednesday, Friday    . atorvastatin (LIPITOR) 10 MG tablet Take 1 tablet (10 mg total) by mouth every evening. 90 tablet 3  . B Complex-Biotin-FA (B COMPLETE PO) Take 1 tablet by mouth daily.     . carvedilol (COREG) 6.25 MG tablet Take 1 tablet (6.25 mg total) by mouth 2 (two) times daily with a meal. 180 tablet 3  . Coenzyme Q10 (CO Q-10 PO) Take 1 tablet by mouth every morning.     . darbepoetin (ARANESP) 200 MCG/0.4ML SOLN injection Inject 200 mcg into the skin as directed.    Marland Kitchen  dexamethasone (DECADRON) 4 MG tablet Take five tablets 2 consecutive days every week 40 tablet 6  . diphenoxylate-atropine (LOMOTIL) 2.5-0.025 MG per tablet Take 1 tablet by mouth 4 (four) times daily as needed for diarrhea or loose stools. 30 tablet 0  . furosemide (LASIX) 40 MG tablet Take 1 tablet (40 mg total) by mouth daily. 30 tablet 3  . HYDROmorphone (DILAUDID) 4 MG tablet Take 1 tablet (4 mg total) by mouth every 4 (four) hours as needed for severe pain. 30 tablet 0  . isosorbide mononitrate (IMDUR) 30 MG 24 hr tablet Take 1 tablet (30 mg total) by mouth daily. 90 tablet 3  . lisinopril (PRINIVIL,ZESTRIL) 20 MG tablet Take 20 mg by mouth daily.    . Multiple Vitamin (MULTIVITAMIN) capsule Take 1 capsule by mouth every morning.     . nitroGLYCERIN (NITROSTAT) 0.4 MG SL tablet Place 1 tablet (0.4 mg total) under the tongue every 5 (five) minutes as needed for chest pain. 25 tablet 3  . ondansetron (ZOFRAN) 8 MG tablet Take 1 tablet (8 mg total) by mouth 2 (two) times daily as needed (Nausea or vomiting). 30 tablet 1  . polyethylene glycol (MIRALAX / GLYCOLAX) packet Take 17 g by mouth daily as needed. 14 each 0  . rOPINIRole (REQUIP) 3 MG tablet Take 1 tablet (3 mg total) by mouth at bedtime. 90 tablet 4  .  tamsulosin (FLOMAX) 0.4 MG CAPS capsule Take 0.4 mg by mouth at bedtime.    Marland Kitchen tobramycin (TOBREX) 0.3 % ophthalmic solution Place 1 drop into both eyes daily as needed (for Bacterial eye infection).     . vitamin C (ASCORBIC ACID) 500 MG tablet Take 500 mg by mouth every morning.     No current facility-administered medications on file prior to visit.    Allergies  Allergen Reactions  . Morphine And Related Other (See Comments)    Agitated with morphine drip    Past Medical History  Diagnosis Date  . ASCVD (arteriosclerotic cardiovascular disease)   . Anemia     chronic mild anemia  . Gout   . Hypertension   . Lumbar disc disease     post laminectomy  . Chronic back pain    . History of echocardiogram 11/02/2010    EF range of 30 to 35% / There is hypokinsesis of the basal-mild inferolateral myocardium  . CHF (congestive heart failure)   . Heart murmur   . Ischemic cardiomyopathy   . Renal insufficiency   . Peripheral neuropathy   . Inferior myocardial infarction 1986  . Hyperlipidemia   . SOB (shortness of breath)   . Fatigue   . Peripheral neuropathy   . Coronary artery disease     CABG 2002 by Dr. Roxy Manns with LIMA to LAD, SVG to intermediate, SVG to LCX, & SVG to PL. Last nuclear in 2012 showing large inferior scar with EF of 39%.   . Blood transfusion     DEC 2012 - TWO UNITS  . Myeloma     Multiple myeloma, with recurrence of increasing problems  DR. Cleburne -ONCOLOGIST.  PT HAS BEEN OFF CHEMO SINCE HIS HOSPITALIZATION FEB 2013 FOR LEFT FOOT INFECTION  . Cancer   . History of shingles MARCH 2009    SHINGLE LESIONS WERE AROUND RIGHT EYE--PT HAS RESIDUAL ITCHING AROUND THE EYE.  . Osteomyelitis     s/p toe amputation  . Chronic systolic heart failure     EF of 30% per echo 02/2012  . Protein malnutrition   . Critical lower limb ischemia   . Peripheral vascular disease   . Hx of cardiovascular stress test     Lexiscan Myoview (1/16):  Large inferior and apical scar; no ischemia, EF 27%;  High Risk    Past Surgical History  Procedure Laterality Date  . Laminectomy    . Inguinal hernia repair    . Cardiac catheterization  06/20/2000    Severe coronary disease (totally occluded right artery, 90% left circumflex, 50-60% intermediate, and 90-95% ostial left anterior descending)   . Tonsillectomy    . Coronary artery bypass graft  2002    x4 / Left internal mammary artery to the LAD.  / Saphenous vein graft to the right posterolateral.  /  Saphenous vein graft to  the ramus intermedius. / Saphenous vein graft to the circumflex marginal     . Amputation  03/06/2012    Procedure: AMPUTATION RAY;  Surgeon: Alta Corning, MD;  Location: [facility];   Service: Orthopedics;  Laterality: Left;  First Ray Amputation  . Intramedullary (im) nail intertrochanteric Right 08/17/2013    Procedure: INTRAMEDULLARY (IM) NAIL INTERTROCHANTRIC;  Surgeon: Marybelle Killings, MD;  Location: [facility];  Service: Orthopedics;  Laterality: Right;  . Endarterectomy femoral Left 03/24/2014    Procedure: Left Common Femoral  Artery Endarterectomy with Vein Patch Angioplasty,  Ultrasound ;  Surgeon: Judeth Cornfield  Scot Dock, MD;  Location: [facility];  Service: Vascular;  Laterality: Left;  . Toe amputation    . Lower extremity angiogram N/A 03/23/2014    Procedure: LOWER EXTREMITY ANGIOGRAM;  Surgeon: Lorretta Harp, MD;  Location: [facility] CATH [facility];  Service: Cardiovascular;  Laterality: N/A;    History  Smoking status  . Former Smoker -- 1.00 packs/day for 20 years  . Types: Cigarettes  . Quit date: 11/20/1984  Smokeless tobacco  . Never Used    History  Alcohol Use No    Family History  Problem Relation Age of Onset  . Heart attack Mother   . Heart disease Father     Review of Systems: The review of systems is per the HPI.  All other systems were reviewed and are negative.  Physical Exam: BP 122/76 mmHg  Pulse 84  Ht _0  (1.778 m)  Wt 192 lb (87.091 kg)  BMI 27.55 kg/m2 Patient is very pleasant, obese and in no acute distress. Skin is warm and dry. Color is normal.  HEENT is unremarkable. Normocephalic/atraumatic. PERRL. Sclera are nonicteric. Neck is supple. No masses. No JVD. Lungs reveal are clear. Cardiac exam shows a regular rate and rhythm. Normal S1-2. No gallop or murmur. Abdomen is soft. Extremities reveal 1+ edema. Gait and ROM are intact. No gross neurologic deficits noted.  LABORATORY DATA:  Lab Results  Component Value Date   WBC 6.2 12/01/2014   HGB 9.7* 12/01/2014   HCT 30.3* 12/01/2014   PLT 157 12/01/2014   GLUCOSE 101 12/01/2014   CHOL 102 08/13/2013   TRIG 75 08/13/2013   HDL 37* 08/13/2013   LDLCALC 50 08/13/2013   ALT 13 12/01/2014    AST 20 12/01/2014   NA 133* 12/01/2014   K 4.5 12/01/2014   CL 96 08/11/2014   CREATININE 1.2 12/01/2014   BUN 26.7* 12/01/2014   CO2 24 12/01/2014   TSH 3.01 11/11/2014   PSA 3.75 05/30/2006   INR 1.15 03/23/2014  BNP 138 in December.   Echo 12/09/13:Study Conclusions  - Left ventricle: Septal and apical hypokinesis posterior lateral and inferobasal akinesis. The cavity size was moderately dilated. Wall thickness was increased in a pattern of mild LVH. The estimated ejection fraction was 35%. - Mitral valve: There was mild regurgitation. - Left atrium: The atrium was moderately dilated. - Atrial septum: No defect or patent foramen ovale was identified.  Cardiology Nuclear Med Study  ROBERTS BON is a 81 y.o. male MRN : [id number] DOB: [birth year]  Procedure Date: 11/24/2014  Nuclear Med Background Indication for Stress Test: Evaluation for Ischemia and Graft Patency History: CAD-CABG, 12/18/12 MPI: EF: 41% Scar minimal peri-infarct ischemia Cardiac Risk Factors: Hypertension  Symptoms: SOB   Nuclear Pre-Procedure Caffeine/Decaff Intake: None> 12 hrs NPO After: 7:30am   Lungs: clear O2 Sat: 97% on room air. IV 0.9% NS with Angio Cath: 22g  IV Site: R Hand x 1, tolerated well IV Started by: Irven Baltimore, RN  Chest Size (in): 46 Cup Size: n/a  Height: _1  (1.778 m)  Weight: 193 lb (87.544 kg)  BMI: Body mass index is 27.69 kg/(m^2). Tech Comments: Patient took Carvedilol this am. Irven Baltimore, RN.    Nuclear Med Study 1 or 2 day study: 1 day  Stress Test Type: Carlton Adam  Reading MD: N/A  Order Authorizing Provider: Allyce Bochicchio [place], MD  Resting Radionuclide: Technetium 54mSestamibi  Resting Radionuclide Dose: 11.0 mCi   Stress Radionuclide: Technetium 967mestamibi  Stress Radionuclide Dose:  33.0 mCi      Stress Protocol Rest HR: 56 Stress HR: 76  Rest BP: 136/54 Stress BP: 127/65  Exercise Time (min):  n/a METS: n/a   Predicted Max HR: 136 bpm % Max HR: 55.88 bpm Rate Pressure Product: 10336   Dose of Adenosine (mg): n/a Dose of Lexiscan: 0.4 mg  Dose of Atropine (mg): n/a Dose of Dobutamine: n/a mcg/kg/min (at max HR)  Stress Test Technologist: Perrin Maltese, EMT-P  Nuclear Technologist: Earl Many, CNMT     Rest Procedure: Myocardial perfusion imaging was performed at rest 45 minutes following the intravenous administration of Technetium 69mSestamibi. Rest ECG: NSR - Normal EKG  Stress Procedure: The patient received IV Lexiscan 0.4 mg over 15-seconds. Technetium 965mestamibi injected at 30-seconds. This patient had sob, chest fullness, and was flushed with the [facility]. Quantitative spect images were obtained after a 45 minute delay. Stress ECG: No significant change from baseline ECG and There are scattered PVCs.  QPS Raw Data Images: Normal; no motion artifact; normal heart/lung ratio. Stress Images: Fixed inferior and apical defects Rest Images: Fixed inferior and apical defects Subtraction (SDS): No evidence of ischemia. Transient Ischemic Dilatation (Normal <1.22): 1.19 Lung/Heart Ratio (Normal <0.45): 0.36  Quantitative Gated Spect Images QGS EDV: 175 ml QGS ESV: 127 ml  Impression Exercise Capacity: Lexiscan with no exercise. BP Response: Normal blood pressure response. Clinical Symptoms: Mild chest pain/dyspnea. ECG Impression: No significant ECG changes with Lexiscan. Comparison with Prior Nuclear Study: EF 41% with inferior scar  Overall Impression: High risk stress nuclear study with moderate intensity, large size fixed inferior and apical scar. No reversible ischemia.  LV Ejection Fraction: 27%. LV Wall Motion: Global hypokinesis with inferior akinesis/dyskinesis   KePixie CasinoMD, FANanceertified in Nuclear Cardiology Attending Cardiologist [facility]    Assessment / Plan: 1. Dyspnea on  exertion. This is probably multifactorial. There is a component of  Acute on Chronic systolic congestive heart failure. He is also anemic. There is a possibility of coronary ischemia since he is 14 years out from CABG but his recent myoview did not show definite ischemia. I have recommended continuing lasix 40 mg daily. If we can achieve a good diuresis with about 5 lbs weight loss we can see if symptoms respond to tuning up his CHF. If symptoms do not improve we did discuss the option of right and left heart cath. With his muliple myeloma there is concern about angiography and the risk of renal failure. he did have angiography for his PAD last year and tolerated it well. Clearly he would not be a candidate for redo CABG but the question would be if he has something amenable to PCI. I would prefer to wait and see his response to diuresis. The other consideration would be that his symptoms are more related to his myeloma. He could be at increased risk for PE as well. I will follow up in 3 weeks to see his response. He is scheduled for follow up lab work on 12/18/13 in the oncology office.   2. Chronic systolic CHF. EF is stable by Echo at 35%. Patient has a known ischemic cardiomyopathy. He does appear to be volume overloaded today with  edema. Continue lasix 40 mg daily and sodium and fluid restriction.  3. Multiple myeloma - followed by oncology. On Decadron and Cytoxan.  4. HLD - on statin therapy.   5. Coronary disease status post CABG in 2002. Dyspnea could be anginal equivalent symptoms  and in fact this is how he initially presented.  Myoview study shows a large inferior and apical scar without ischemia.  Continue  medical therapy for now.  6. Hyponatremia. Will need to follow closely with diuresis. Continue fluid restriction.

## 2014-12-10 NOTE — Patient Instructions (Addendum)
Continue your current therapy  I will see you in 3 weeks.  Continue fluid restriction.

## 2014-12-11 ENCOUNTER — Ambulatory Visit: Payer: Medicare Other | Admitting: Podiatrist

## 2014-12-18 ENCOUNTER — Other Ambulatory Visit: Payer: Self-pay | Admitting: Emergency Medicine

## 2014-12-18 ENCOUNTER — Telehealth: Payer: Self-pay | Admitting: *Deleted

## 2014-12-18 ENCOUNTER — Ambulatory Visit (HOSPITAL_BASED_OUTPATIENT_CLINIC_OR_DEPARTMENT_OTHER): Payer: Medicare Other

## 2014-12-18 ENCOUNTER — Encounter: Payer: Self-pay | Admitting: Nurse Practitioner

## 2014-12-18 ENCOUNTER — Other Ambulatory Visit (HOSPITAL_BASED_OUTPATIENT_CLINIC_OR_DEPARTMENT_OTHER): Payer: Medicare Other

## 2014-12-18 ENCOUNTER — Telehealth: Payer: Self-pay | Admitting: Oncology

## 2014-12-18 ENCOUNTER — Ambulatory Visit (HOSPITAL_BASED_OUTPATIENT_CLINIC_OR_DEPARTMENT_OTHER): Payer: Medicare Other | Admitting: Nurse Practitioner

## 2014-12-18 VITALS — BP 126/49 | HR 65 | Resp 18 | Ht 70.0 in | Wt 190.4 lb

## 2014-12-18 DIAGNOSIS — D649 Anemia, unspecified: Secondary | ICD-10-CM

## 2014-12-18 DIAGNOSIS — C9000 Multiple myeloma not having achieved remission: Secondary | ICD-10-CM

## 2014-12-18 DIAGNOSIS — E222 Syndrome of inappropriate secretion of antidiuretic hormone: Secondary | ICD-10-CM

## 2014-12-18 DIAGNOSIS — M869 Osteomyelitis, unspecified: Secondary | ICD-10-CM

## 2014-12-18 DIAGNOSIS — D630 Anemia in neoplastic disease: Secondary | ICD-10-CM

## 2014-12-18 DIAGNOSIS — I70209 Unspecified atherosclerosis of native arteries of extremities, unspecified extremity: Secondary | ICD-10-CM

## 2014-12-18 DIAGNOSIS — L98499 Non-pressure chronic ulcer of skin of other sites with unspecified severity: Principal | ICD-10-CM

## 2014-12-18 DIAGNOSIS — Z5112 Encounter for antineoplastic immunotherapy: Secondary | ICD-10-CM

## 2014-12-18 DIAGNOSIS — I739 Peripheral vascular disease, unspecified: Secondary | ICD-10-CM

## 2014-12-18 LAB — CBC WITH DIFFERENTIAL/PLATELET
BASO%: 0.7 % (ref 0.0–2.0)
Basophils Absolute: 0.1 10*3/uL (ref 0.0–0.1)
EOS%: 1.1 % (ref 0.0–7.0)
Eosinophils Absolute: 0.1 10*3/uL (ref 0.0–0.5)
HEMATOCRIT: 32.3 % — AB (ref 38.4–49.9)
HEMOGLOBIN: 10.3 g/dL — AB (ref 13.0–17.1)
LYMPH%: 21.2 % (ref 14.0–49.0)
MCH: 28.3 pg (ref 27.2–33.4)
MCHC: 31.9 g/dL — AB (ref 32.0–36.0)
MCV: 88.7 fL (ref 79.3–98.0)
MONO#: 1.2 10*3/uL — ABNORMAL HIGH (ref 0.1–0.9)
MONO%: 15.8 % — ABNORMAL HIGH (ref 0.0–14.0)
NEUT#: 4.6 10*3/uL (ref 1.5–6.5)
NEUT%: 61.2 % (ref 39.0–75.0)
Platelets: 162 10*3/uL (ref 140–400)
RBC: 3.64 10*6/uL — AB (ref 4.20–5.82)
RDW: 21 % — AB (ref 11.0–14.6)
WBC: 7.5 10*3/uL (ref 4.0–10.3)
lymph#: 1.6 10*3/uL (ref 0.9–3.3)
nRBC: 0 % (ref 0–0)

## 2014-12-18 MED ORDER — SODIUM CHLORIDE 0.9 % IV SOLN
Freq: Once | INTRAVENOUS | Status: AC
  Administered 2014-12-18: 12:00:00 via INTRAVENOUS

## 2014-12-18 MED ORDER — DARBEPOETIN ALFA 300 MCG/0.6ML IJ SOSY
300.0000 ug | PREFILLED_SYRINGE | Freq: Once | INTRAMUSCULAR | Status: AC
  Administered 2014-12-18: 300 ug via SUBCUTANEOUS
  Filled 2014-12-18: qty 0.6

## 2014-12-18 MED ORDER — SODIUM CHLORIDE 0.9 % IV SOLN
300.0000 mg/m2 | Freq: Once | INTRAVENOUS | Status: AC
  Administered 2014-12-18: 620 mg via INTRAVENOUS
  Filled 2014-12-18: qty 31

## 2014-12-18 MED ORDER — DEXAMETHASONE SODIUM PHOSPHATE 10 MG/ML IJ SOLN
4.0000 mg | Freq: Once | INTRAMUSCULAR | Status: AC
  Administered 2014-12-18: 4 mg via INTRAVENOUS

## 2014-12-18 MED ORDER — ONDANSETRON 8 MG/NS 50 ML IVPB
INTRAVENOUS | Status: AC
  Filled 2014-12-18: qty 8

## 2014-12-18 MED ORDER — DEXAMETHASONE SODIUM PHOSPHATE 10 MG/ML IJ SOLN
INTRAMUSCULAR | Status: AC
  Filled 2014-12-18: qty 1

## 2014-12-18 MED ORDER — ONDANSETRON 8 MG/50ML IVPB (CHCC)
8.0000 mg | Freq: Once | INTRAVENOUS | Status: AC
  Administered 2014-12-18: 8 mg via INTRAVENOUS

## 2014-12-18 NOTE — Patient Instructions (Signed)
[facility] Discharge Instructions for Patients Receiving Chemotherapy  Today you received the following chemotherapy agents: Cytoxan, Aranesp  To help prevent nausea and vomiting after your treatment, we encourage you to take your nausea medication as prescribed by your physician.   If you develop nausea and vomiting that is not controlled by your nausea medication, call the clinic.   BELOW ARE SYMPTOMS THAT SHOULD BE REPORTED IMMEDIATELY:  *FEVER GREATER THAN 100.5 F  *CHILLS WITH OR WITHOUT FEVER  NAUSEA AND VOMITING THAT IS NOT CONTROLLED WITH YOUR NAUSEA MEDICATION  *UNUSUAL SHORTNESS OF BREATH  *UNUSUAL BRUISING OR BLEEDING  TENDERNESS IN MOUTH AND THROAT WITH OR WITHOUT PRESENCE OF ULCERS  *URINARY PROBLEMS  *BOWEL PROBLEMS  UNUSUAL RASH Items with * indicate a potential emergency and should be followed up as soon as possible.  Feel free to call the clinic you have any questions or concerns. The clinic phone number is [phone].

## 2014-12-18 NOTE — Progress Notes (Signed)
ID: Timothy [name]   DOB: 03-May-1928  MR#: [id number]  LEX#:[id number]  PCP:  SU: OTHER MD: Dorna Leitz, Alysia Penna, Georgia Duff, Geryl Councilman  CHIEF COMPLAINT: multiple myeloma CURRENT THERAPY: cyclophosphamide, dexamethasone  MYELOMA HISTORY: From the earlier summary:  Patient presented in April 2007 with symptomatic slowly progressive anemia, with normal iron parameters, folic acid, and vitamin B12. Plans were made to obtain bone marrow aspirate and biopsy as an outpatient, however the patient was hospitalized shortly thereafter with a creatinine of 8.6. Renal biopsy demonstrated the presence of  light chain deposition disease. Bone marrow biopsy demonstrated 37% plasma cells. A skeletal survey showed small calvarial lytic lesions, mild osteopenia, and cervical spondylosis. With a well-established diagnosis of light-chain multiple myeloma Dr. Melodie Bouillon subsequent treatments are as detailed below.   INTERVAL HISTORY: [name] returns today with his wife, [name], for follow up of his multiple myeloma. Today is day 1 cycle 6 of his every 2 week Cytoxan treatments. He also takes dexamethasone 20 mg 2 days a week. He tolerates treatments generally well with few complaints. Last week he visited his cardiologist, who put him on lasix. His shortness of breath has improved somewhat, as well as his bilateral lower extremity edema. He has lost 10lbs since his last visit.  REVIEW OF SYSTEMS: [name] denies fevers, chills, nausea or vomiting. He takes stool softeners to move his bowels regularly. His appetite is healthy, and he has reduced his fluid intake to 1.5L as discussed at the previous visit. He denies mouth sores or rashes, bruising, or bleeding. He still has residual neuropathy from previous treatment, but this is no worse. He is short of breath with exertion, but denies chest pain, cough, or palpitations. He sleeps well. He  continues to perform upper body exercises with a large rubber band. He uses dilaudid 1-2 times daily for his lower back pain. A detailed review of systems is otherwise stable.  PAST MEDICAL HISTORY: Past Medical History  Diagnosis Date  . ASCVD (arteriosclerotic cardiovascular disease)   . Anemia     chronic mild anemia  . Gout   . Hypertension   . Lumbar disc disease     post laminectomy  . Chronic back pain   . History of echocardiogram 11/02/2010    EF range of 30 to 35% / There is hypokinsesis of the basal-mild inferolateral myocardium  . CHF (congestive heart failure)   . Heart murmur   . Ischemic cardiomyopathy   . Renal insufficiency   . Peripheral neuropathy   . Inferior myocardial infarction 1986  . Hyperlipidemia   . SOB (shortness of breath)   . Fatigue   . Peripheral neuropathy   . Coronary artery disease     CABG 2002 by Dr. Roxy Manns with LIMA to LAD, SVG to intermediate, SVG to LCX, & SVG to PL. Last nuclear in 2012 showing large inferior scar with EF of 39%.   . Blood transfusion     DEC 2012 - TWO UNITS  . Myeloma     Multiple myeloma, with recurrence of increasing problems  DR. Maribel -ONCOLOGIST.  PT HAS BEEN OFF CHEMO SINCE HIS HOSPITALIZATION FEB 2013 FOR LEFT FOOT INFECTION  . Cancer   . History of shingles MARCH 2009    SHINGLE LESIONS WERE AROUND RIGHT EYE--PT HAS RESIDUAL ITCHING AROUND THE EYE.  . Osteomyelitis     s/p toe amputation  . Chronic systolic heart failure     EF of  30% per echo 02/2012  . Protein malnutrition   . Critical lower limb ischemia   . Peripheral vascular disease   . Hx of cardiovascular stress test     Lexiscan Myoview (1/16):  Large inferior and apical scar; no ischemia, EF 27%;  High Risk   PAST SURGICAL HISTORY: Past Surgical History  Procedure Laterality Date  . Laminectomy    . Inguinal hernia repair    . Cardiac catheterization  06/20/2000    Severe coronary disease (totally occluded right artery, 90% left  circumflex, 50-60% intermediate, and 90-95% ostial left anterior descending)   . Tonsillectomy    . Coronary artery bypass graft  2002    x4 / Left internal mammary artery to the LAD.  / Saphenous vein graft to the right posterolateral.  /  Saphenous vein graft to  the ramus intermedius. / Saphenous vein graft to the circumflex marginal     . Amputation  03/06/2012    Procedure: AMPUTATION RAY;  Surgeon: Alta Corning, MD;  Location: [facility];  Service: Orthopedics;  Laterality: Left;  First Ray Amputation  . Intramedullary (im) nail intertrochanteric Right 08/17/2013    Procedure: INTRAMEDULLARY (IM) NAIL INTERTROCHANTRIC;  Surgeon: Marybelle Killings, MD;  Location: [facility];  Service: Orthopedics;  Laterality: Right;  . Endarterectomy femoral Left 03/24/2014    Procedure: Left Common Femoral  Artery Endarterectomy with Vein Patch Angioplasty,  Ultrasound ;  Surgeon: Angelia Mould, MD;  Location: [facility];  Service: Vascular;  Laterality: Left;  . Toe amputation    . 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 He and his wife [name] are the only ones at home.    ADVANCED DIRECTIVES:   HEALTH MAINTENANCE: History  Substance Use Topics  . Smoking status: Former Smoker -- 1.00 packs/day for 20 years    Types: Cigarettes    Quit date: 11/20/1984  . Smokeless tobacco: Never Used  . Alcohol Use: No     Colonoscopy:  Bone density:  Lipid panel:  Allergies  Allergen Reactions  . Morphine And Related Other (See Comments)    Agitated with morphine drip    Current Outpatient Prescriptions  Medication Sig Dispense Refill  . acyclovir (ZOVIRAX) 400 MG  tablet Take 1 tablet (400 mg total) by mouth 2 (two) times daily. 60 tablet 3  . allopurinol (ZYLOPRIM) 300 MG tablet Take 0.5 tablets (150 mg total) by mouth daily. 90 tablet 3  . amLODipine (NORVASC) 2.5 MG tablet Take 1 tablet (2.5 mg total) by mouth daily. 90 tablet 3  . aspirin EC 81 MG tablet Take 81 mg by mouth 3 (three) times a week. Monday, Wednesday, Friday    . atorvastatin (LIPITOR) 10 MG tablet Take 1 tablet (10 mg total) by mouth every evening. 90 tablet 3  . B Complex-Biotin-FA (B COMPLETE PO) Take 1 tablet by mouth daily.     . carvedilol (COREG) 6.25 MG tablet Take 1 tablet (6.25 mg total) by mouth 2 (two) times daily with a meal. 180 tablet 3  . Coenzyme Q10 (CO Q-10 PO) Take 1 tablet by mouth every morning.     . darbepoetin (ARANESP) 200 MCG/0.4ML SOLN injection Inject 200  mcg into the skin as directed.    Marland Kitchen dexamethasone (DECADRON) 4 MG tablet Take five tablets 2 consecutive days every week 40 tablet 6  . diphenoxylate-atropine (LOMOTIL) 2.5-0.025 MG per tablet Take 1 tablet by mouth 4 (four) times daily as needed for diarrhea or loose stools. 30 tablet 0  . furosemide (LASIX) 40 MG tablet Take 1 tablet (40 mg total) by mouth daily. 30 tablet 3  . HYDROmorphone (DILAUDID) 4 MG tablet Take 1 tablet (4 mg total) by mouth every 4 (four) hours as needed for severe pain. 30 tablet 0  . isosorbide mononitrate (IMDUR) 30 MG 24 hr tablet Take 1 tablet (30 mg total) by mouth daily. 90 tablet 3  . lisinopril (PRINIVIL,ZESTRIL) 20 MG tablet Take 20 mg by mouth daily.    . Multiple Vitamin (MULTIVITAMIN) capsule Take 1 capsule by mouth every morning.     . nitroGLYCERIN (NITROSTAT) 0.4 MG SL tablet Place 1 tablet (0.4 mg total) under the tongue every 5 (five) minutes as needed for chest pain. 25 tablet 3  . ondansetron (ZOFRAN) 8 MG tablet Take 1 tablet (8 mg total) by mouth 2 (two) times daily as needed (Nausea or vomiting). 30 tablet 1  . polyethylene glycol (MIRALAX / GLYCOLAX) packet  Take 17 g by mouth daily as needed. 14 each 0  . rOPINIRole (REQUIP) 3 MG tablet Take 1 tablet (3 mg total) by mouth at bedtime. 90 tablet 4  . tamsulosin (FLOMAX) 0.4 MG CAPS capsule Take 0.4 mg by mouth at bedtime.    Marland Kitchen tobramycin (TOBREX) 0.3 % ophthalmic solution Place 1 drop into both eyes daily as needed (for Bacterial eye infection).     . vitamin C (ASCORBIC ACID) 500 MG tablet Take 500 mg by mouth every morning.     No current facility-administered medications for this visit.    Objective: Older white man using a walker Filed Vitals:   12/18/14 1053  BP: 126/49  Pulse: 65  Resp: 18        Body mass index is 27.32 kg/(m^2).    ECOG FS: 2 Filed Weights   12/18/14 1053  Weight: 190 lb 6.4 oz (86.365 kg)   Skin: warm, dry  HEENT: sclerae anicteric, conjunctivae pink, oropharynx clear. No thrush or mucositis.  Lymph Nodes: No cervical or supraclavicular lymphadenopathy  Lungs: clear to auscultation bilaterally, no rales, wheezes, or rhonci  Heart: regular rate and rhythm  Abdomen: round, firm, non tender, positive bowel sounds  Musculoskeletal: No focal spinal tenderness, +1 edema to bilateral lower extremities  Neuro: non focal, well oriented, positive affect    LAB RESULTS:  Results for TYREIK, DELAHOUSSAYE (MRN [id number]) as of 12/04/2014 13:23  Ref. Range 07/31/2014 10:16 08/28/2014 13:16 09/25/2014 09:48 10/23/2014 13:08 12/01/2014 11:19  Kappa free light chain Latest Range: 0.33-1.94 mg/dL 77.50 (H) 78.60 (H) 135.00 (H) 62.30 (H) 74.00 (H)  Lambda Free Lght Chn Latest Range: 0.57-2.63 mg/dL 2.64 (H) 2.89 (H) 2.72 (H) 2.35 2.36  Kappa:Lambda Ratio Latest Range: 0.26-1.65  29.36 (H) 27.20 (H) 49.63 (H) 26.51 (H) 31.36 (H)     Lab Results  Component Value Date   WBC 7.5 12/18/2014   NEUTROABS 4.6 12/18/2014   HGB 10.3* 12/18/2014   HCT 32.3* 12/18/2014   MCV 88.7 12/18/2014   PLT 162 12/18/2014      Chemistry      Component Value Date/Time   NA 133* 12/01/2014 1119    NA 126* 08/11/2014 1052   K 4.5 12/01/2014  1119   K 4.0 08/11/2014 1052   CL 96 08/11/2014 1052   CL 98 05/12/2013 1048   CO2 24 12/01/2014 1119   CO2 24 08/11/2014 1052   BUN 26.7* 12/01/2014 1119   BUN 12 08/11/2014 1052   CREATININE 1.2 12/01/2014 1119   CREATININE 1.0 08/11/2014 1052      Component Value Date/Time   CALCIUM 8.6 12/01/2014 1119   CALCIUM 9.0 08/11/2014 1052   ALKPHOS 54 12/01/2014 1119   ALKPHOS 42 03/25/2014 2115   AST 20 12/01/2014 1119   AST 25 03/25/2014 2115   ALT 13 12/01/2014 1119   ALT 10 03/25/2014 2115   BILITOT 0.68 12/01/2014 1119   BILITOT 0.7 03/25/2014 2115      STUDIES: No results found.  Most recent echocardiogram on 12/09/14 showed an EF of 35%.  ASSESSMENT: 81 y.o.  with kappa light chain multiple myeloma   (1) presenting April 2007 with anemia and renal failure; renal Bx showing free kappa light chain deposition; bone marrow biopsy showing 37% plasma cells, with normal cytogenetics and FISH;bone survey showing skull lytic lesions; treated with   (2) thalidomide (200 mg/d) and dexamethasone (40 mg/d x4d Q28d) June through Nov 2007, with good response, but poor tolerance;   (3) thalidomide decreased to 100 mg/d, dexamethasone continued, bortezomib (IV) added, Dec 2007 to March 2008   (4) treatment interrupted by multiple complications (peripheral neuropathy, pulmonary embolism, diverticular abscess, CN V zoster, severe DDD, congestive heart failure, rising PSA); maintenance zolendronic acid through March 2012   (5) progression April 2012, treated initially with dexamethasone alone, poorly tolerated;   (6) bortezomib (sq) resumed July 2012; cyclophosphamide and dexamethasone added Sept 2012; zolendronic acid changed to Q 68months; all treatments held as of March 2013 due to the development of the Left foot ulcer described above  (7) anemia, on aranesp December 2012 to August 2013; resumed Q14d starting May 2015, with slowly rising  reticulocyte count  (8) status post Right trochanteric nail with proximal and distal interlock DePuy 11 mm one-pin lag screw, 44 mm distal interlock 08/09/2013 for a right comminuted intertrochanteric fracture  (9) status post removal of the first 2 toes left foot  (10) Left common femoral artery endarterectomy with vein patch angioplasty using left greater saphenous vein 03/24/2014  (11) status post fall with injury to the right hip, large hematoma, requiring rehabilitation at [facility] until 07/30/2014  (12) on 10/09/2014 starting cyclophosphamide at 300 mg/m every 14 days and dexamethasone 20 mg daily 2 days each week  (13) SIADH: On 2 L a day fluid restriction, reduced to 1.5L on 12/04/14 because of drop in sodium  Results for ZACKERY, BRINE (MRN [id number]) as of 12/04/2014 13:23  Ref. Range 08/28/2014 13:16 09/25/2014 09:48 10/23/2014 13:08 11/06/2014 10:35 12/01/2014 11:19  Sodium Latest Range: 136-145 mEq/L 133 (L) 136 136 137 133 (L)   PLAN:   [name] is doing well today. The CBC was reviewed in detail and was stable. He only has a CMET drawn every 4 weeks, and was thus not due today. This will be important to review as he is on lasix, and also has a history of SIADH. His hgb is 10.3 and he will receive aranesp today. He will proceed with cycle 6 of cyclophosphamide today, and will continue dexamethasone twice weekly.   [name] will return in 2 weeks for repeat labs, including SPEP, light chains, and protein/creatinine ratio. A few days later he will follow up with Dr. [name] prior to the  start of cycle 7. He understands and agrees wit this plan. He has been encouraged to call with any issues that might arise before his next visit here.  Marcelino Duster    12/18/2014

## 2014-12-18 NOTE — Telephone Encounter (Signed)
, °

## 2014-12-18 NOTE — Telephone Encounter (Signed)
Per staff message and POF I have scheduled appts. Advised scheduler of appts. JMW  

## 2014-12-22 ENCOUNTER — Encounter: Payer: Self-pay | Admitting: Nurse Practitioner

## 2014-12-23 ENCOUNTER — Telehealth: Payer: Self-pay

## 2014-12-23 NOTE — Telephone Encounter (Signed)
[facility] said [facility] for our callback about his dilaudid rx and interactions with other medications. [facility] at [facility] specialists. Earlene Plater said she was wrong person and transferred to pharmacist.

## 2014-12-23 NOTE — Telephone Encounter (Signed)
S/w william at [facility]. He said the 4 on the dilaudid script was overwritten and they needed clarification. S/w heather NP and [facility] the rx is dilaudid 4 mg q 4 hr prn. They will deliver the medication on a rush order. Called pt back.

## 2014-12-24 ENCOUNTER — Ambulatory Visit: Payer: Medicare Other

## 2014-12-24 ENCOUNTER — Other Ambulatory Visit: Payer: Medicare Other

## 2014-12-25 ENCOUNTER — Encounter: Payer: Self-pay | Admitting: Podiatrist

## 2014-12-25 ENCOUNTER — Ambulatory Visit (INDEPENDENT_AMBULATORY_CARE_PROVIDER_SITE_OTHER): Payer: Medicare Other | Admitting: Podiatrist

## 2014-12-25 VITALS — BP 109/67 | HR 79 | Resp 12

## 2014-12-25 DIAGNOSIS — L97529 Non-pressure chronic ulcer of other part of left foot with unspecified severity: Secondary | ICD-10-CM

## 2014-12-25 DIAGNOSIS — I255 Ischemic cardiomyopathy: Secondary | ICD-10-CM

## 2014-12-25 MED ORDER — SILVER SULFADIAZINE 1 % EX CREA: 1.0000 "application " | TOPICAL_CREAM | Freq: Every day | CUTANEOUS | Status: DC

## 2014-12-25 MED ORDER — CEPHALEXIN 500 MG PO CAPS: 500.0000 mg | ORAL_CAPSULE | Freq: Three times a day (TID) | ORAL | Status: DC

## 2014-12-25 NOTE — Progress Notes (Signed)
Chief Complaint  Patient presents with  . Foot Ulcer    ''lt foot is painful.''  . Callouses    LT FOOT 3rd TOE CALLUS.''      patient presents for callus tip of left 3rd toe.  Left 2nd toe has previously been amputated and has gone on to heal.  Swelling is present left foot as well and pain in the foot is related.   Objective: Neurovascular status unchanged with decreased pulses noted.. Callus present at the distal tip of the left third toe.  Pre ulcerative in nature with friable and fragile hemorrhagic tissue present.  Mild swelling of the left foot present.  Assessment: callus tip of left 3rd toe.  Plan: Recommended a accommodative buttress pad to keep pressure off the toe.  Recommended use of silvadene cream and  A dressing to the toe.  Also rx for keflex for 10 days.  i will see him back in 10 days to reassess.

## 2014-12-25 NOTE — Patient Instructions (Signed)
Apply the silver cream to the toe daily and cover with your dressings.  Take the cephalexin as prescribed- 3 times a day

## 2014-12-29 ENCOUNTER — Other Ambulatory Visit: Payer: Self-pay | Admitting: *Deleted

## 2014-12-29 ENCOUNTER — Other Ambulatory Visit (HOSPITAL_BASED_OUTPATIENT_CLINIC_OR_DEPARTMENT_OTHER): Payer: Medicare Other

## 2014-12-29 DIAGNOSIS — L98499 Non-pressure chronic ulcer of skin of other sites with unspecified severity: Principal | ICD-10-CM

## 2014-12-29 DIAGNOSIS — N289 Disorder of kidney and ureter, unspecified: Secondary | ICD-10-CM

## 2014-12-29 DIAGNOSIS — M869 Osteomyelitis, unspecified: Secondary | ICD-10-CM

## 2014-12-29 DIAGNOSIS — C9000 Multiple myeloma not having achieved remission: Secondary | ICD-10-CM

## 2014-12-29 DIAGNOSIS — I739 Peripheral vascular disease, unspecified: Secondary | ICD-10-CM

## 2014-12-29 DIAGNOSIS — D630 Anemia in neoplastic disease: Secondary | ICD-10-CM

## 2014-12-29 DIAGNOSIS — I70209 Unspecified atherosclerosis of native arteries of extremities, unspecified extremity: Secondary | ICD-10-CM

## 2014-12-29 LAB — CBC & DIFF AND RETIC
BASO%: 0.4 % (ref 0.0–2.0)
Basophils Absolute: 0 10*3/uL (ref 0.0–0.1)
EOS ABS: 0.2 10*3/uL (ref 0.0–0.5)
EOS%: 3.1 % (ref 0.0–7.0)
HEMATOCRIT: 30.5 % — AB (ref 38.4–49.9)
HEMOGLOBIN: 9.6 g/dL — AB (ref 13.0–17.1)
Immature Retic Fract: 6.2 % (ref 3.00–10.60)
LYMPH%: 10.6 % — AB (ref 14.0–49.0)
MCH: 27.9 pg (ref 27.2–33.4)
MCHC: 31.5 g/dL — ABNORMAL LOW (ref 32.0–36.0)
MCV: 88.7 fL (ref 79.3–98.0)
MONO#: 0.3 10*3/uL (ref 0.1–0.9)
MONO%: 5.9 % (ref 0.0–14.0)
NEUT%: 80 % — AB (ref 39.0–75.0)
NEUTROS ABS: 3.9 10*3/uL (ref 1.5–6.5)
NRBC: 0 % (ref 0–0)
PLATELETS: 134 10*3/uL — AB (ref 140–400)
RBC: 3.44 10*6/uL — ABNORMAL LOW (ref 4.20–5.82)
RDW: 21.1 % — ABNORMAL HIGH (ref 11.0–14.6)
Retic %: 2.36 % — ABNORMAL HIGH (ref 0.80–1.80)
Retic Ct Abs: 81.18 10*3/uL (ref 34.80–93.90)
WBC: 4.9 10*3/uL (ref 4.0–10.3)
lymph#: 0.5 10*3/uL — ABNORMAL LOW (ref 0.9–3.3)

## 2014-12-29 LAB — COMPREHENSIVE METABOLIC PANEL (CC13)
ALT: 22 U/L (ref 0–55)
AST: 25 U/L (ref 5–34)
Albumin: 3.4 g/dL — ABNORMAL LOW (ref 3.5–5.0)
Alkaline Phosphatase: 48 U/L (ref 40–150)
Anion Gap: 10 mEq/L (ref 3–11)
BILIRUBIN TOTAL: 0.58 mg/dL (ref 0.20–1.20)
BUN: 37.2 mg/dL — ABNORMAL HIGH (ref 7.0–26.0)
CO2: 21 meq/L — AB (ref 22–29)
CREATININE: 1.4 mg/dL — AB (ref 0.7–1.3)
Calcium: 8.8 mg/dL (ref 8.4–10.4)
Chloride: 108 mEq/L (ref 98–109)
EGFR: 46 mL/min/{1.73_m2} — ABNORMAL LOW (ref >90)
Glucose: 119 mg/dl (ref 70–140)
Potassium: 4.1 mEq/L (ref 3.5–5.1)
SODIUM: 139 meq/L (ref 136–145)
TOTAL PROTEIN: 6.1 g/dL — AB (ref 6.4–8.3)

## 2014-12-29 LAB — FERRITIN CHCC: FERRITIN: 265 ng/mL (ref 22–316)

## 2014-12-30 ENCOUNTER — Encounter: Payer: Self-pay | Admitting: Cardiology

## 2014-12-30 ENCOUNTER — Ambulatory Visit (INDEPENDENT_AMBULATORY_CARE_PROVIDER_SITE_OTHER): Payer: Medicare Other | Admitting: Cardiology

## 2014-12-30 VITALS — BP 130/60 | HR 54 | Ht 70.0 in | Wt 190.5 lb

## 2014-12-30 DIAGNOSIS — I10 Essential (primary) hypertension: Secondary | ICD-10-CM

## 2014-12-30 DIAGNOSIS — Z951 Presence of aortocoronary bypass graft: Secondary | ICD-10-CM

## 2014-12-30 DIAGNOSIS — I5022 Chronic systolic (congestive) heart failure: Secondary | ICD-10-CM

## 2014-12-30 DIAGNOSIS — I255 Ischemic cardiomyopathy: Secondary | ICD-10-CM

## 2014-12-30 DIAGNOSIS — C9000 Multiple myeloma not having achieved remission: Secondary | ICD-10-CM

## 2014-12-30 DIAGNOSIS — I2510 Atherosclerotic heart disease of native coronary artery without angina pectoris: Secondary | ICD-10-CM

## 2014-12-30 NOTE — Patient Instructions (Addendum)
Continue your current therapy   I will see you in 3 months. 

## 2014-12-30 NOTE — Progress Notes (Signed)
Timothy [name] Date of Birth: Sep 29, 1928 Medical Record #[id number]  History of Present Illness: Timothy [name] is seen  today for follow up  of dyspnea. He has a complex medical history which includes an ischemic CM with an EF of 35%. He has multiple myeloma with progressive anemia and is followed closely by oncology. He is on Decadron and Cytoxan. Other problems include HLD, depression, remote CABG in 2002, CKD, neuropathy, anemia and gout. He also has a history of hyponatremia.  He developed critical limb ischemia and underwent left femoral endarterectomy and patch angioplasty in May 2015. On his last visit he noted increased edema.  Echo and Myoview studies were done as noted below. He was started on lasix 40 mg daily. He initially had increased urinary frequency but this has tapered off. He feels that his breathing is a little better. He still has some edema and wears support hose. He had slight chest pain on 2 occasions lasting seconds.  Current Outpatient Prescriptions on File Prior to Visit  Medication Sig Dispense Refill  . acyclovir (ZOVIRAX) 400 MG tablet Take 1 tablet (400 mg total) by mouth 2 (two) times daily. 60 tablet 3  . allopurinol (ZYLOPRIM) 300 MG tablet Take 0.5 tablets (150 mg total) by mouth daily. 90 tablet 3  . amLODipine (NORVASC) 2.5 MG tablet Take 1 tablet (2.5 mg total) by mouth daily. 90 tablet 3  . aspirin EC 81 MG tablet Take 81 mg by mouth 3 (three) times a week. Monday, Wednesday, Friday    . atorvastatin (LIPITOR) 10 MG tablet Take 1 tablet (10 mg total) by mouth every evening. 90 tablet 3  . B Complex-Biotin-FA (B COMPLETE PO) Take 1 tablet by mouth daily.     . carvedilol (COREG) 6.25 MG tablet Take 1 tablet (6.25 mg total) by mouth 2 (two) times daily with a meal. 180 tablet 3  . cephALEXin (KEFLEX) 500 MG capsule Take 1 capsule (500 mg total) by mouth 3 (three) times daily. 30 capsule 0  . Coenzyme Q10 (CO Q-10 PO) Take 1 tablet by mouth every morning.     .  darbepoetin (ARANESP) 200 MCG/0.4ML SOLN injection Inject 200 mcg into the skin as directed.    Marland Kitchen dexamethasone (DECADRON) 4 MG tablet Take five tablets 2 consecutive days every week 40 tablet 6  . diphenoxylate-atropine (LOMOTIL) 2.5-0.025 MG per tablet Take 1 tablet by mouth 4 (four) times daily as needed for diarrhea or loose stools. 30 tablet 0  . furosemide (LASIX) 40 MG tablet Take 1 tablet (40 mg total) by mouth daily. 30 tablet 3  . HYDROmorphone (DILAUDID) 4 MG tablet Take 1 tablet (4 mg total) by mouth every 4 (four) hours as needed for severe pain. 30 tablet 0  . isosorbide mononitrate (IMDUR) 30 MG 24 hr tablet Take 1 tablet (30 mg total) by mouth daily. 90 tablet 3  . lisinopril (PRINIVIL,ZESTRIL) 20 MG tablet Take 20 mg by mouth daily.    . Multiple Vitamin (MULTIVITAMIN) capsule Take 1 capsule by mouth every morning.     . nitroGLYCERIN (NITROSTAT) 0.4 MG SL tablet Place 1 tablet (0.4 mg total) under the tongue every 5 (five) minutes as needed for chest pain. 25 tablet 3  . ondansetron (ZOFRAN) 8 MG tablet Take 1 tablet (8 mg total) by mouth 2 (two) times daily as needed (Nausea or vomiting). 30 tablet 1  . polyethylene glycol (MIRALAX / GLYCOLAX) packet Take 17 g by mouth daily as needed. 14 each 0  .  rOPINIRole (REQUIP) 3 MG tablet Take 1 tablet (3 mg total) by mouth at bedtime. 90 tablet 4  . silver sulfADIAZINE (SILVADENE) 1 % cream Apply 1 application topically daily. 50 g 0  . tamsulosin (FLOMAX) 0.4 MG CAPS capsule Take 0.4 mg by mouth at bedtime.    Marland Kitchen tobramycin (TOBREX) 0.3 % ophthalmic solution Place 1 drop into both eyes daily as needed (for Bacterial eye infection).     . vitamin C (ASCORBIC ACID) 500 MG tablet Take 500 mg by mouth every morning.     No current facility-administered medications on file prior to visit.    Allergies  Allergen Reactions  . Morphine And Related Other (See Comments)    Agitated with morphine drip    Past Medical History  Diagnosis  Date  . ASCVD (arteriosclerotic cardiovascular disease)   . Anemia     chronic mild anemia  . Gout   . Hypertension   . Lumbar disc disease     post laminectomy  . Chronic back pain   . History of echocardiogram 11/02/2010    EF range of 30 to 35% / There is hypokinsesis of the basal-mild inferolateral myocardium  . CHF (congestive heart failure)   . Heart murmur   . Ischemic cardiomyopathy   . Renal insufficiency   . Peripheral neuropathy   . Inferior myocardial infarction 1986  . Hyperlipidemia   . SOB (shortness of breath)   . Fatigue   . Peripheral neuropathy   . Coronary artery disease     CABG 2002 by Dr. Roxy Manns with LIMA to LAD, SVG to intermediate, SVG to LCX, & SVG to PL. Last nuclear in 2012 showing large inferior scar with EF of 39%.   . Blood transfusion     DEC 2012 - TWO UNITS  . Myeloma     Multiple myeloma, with recurrence of increasing problems  DR. Worton -ONCOLOGIST.  PT HAS BEEN OFF CHEMO SINCE HIS HOSPITALIZATION FEB 2013 FOR LEFT FOOT INFECTION  . Cancer   . History of shingles MARCH 2009    SHINGLE LESIONS WERE AROUND RIGHT EYE--PT HAS RESIDUAL ITCHING AROUND THE EYE.  . Osteomyelitis     s/p toe amputation  . Chronic systolic heart failure     EF of 30% per echo 02/2012  . Protein malnutrition   . Critical lower limb ischemia   . Peripheral vascular disease   . Hx of cardiovascular stress test     Lexiscan Myoview (1/16):  Large inferior and apical scar; no ischemia, EF 27%;  High Risk    Past Surgical History  Procedure Laterality Date  . Laminectomy    . Inguinal hernia repair    . Cardiac catheterization  06/20/2000    Severe coronary disease (totally occluded right artery, 90% left circumflex, 50-60% intermediate, and 90-95% ostial left anterior descending)   . Tonsillectomy    . Coronary artery bypass graft  2002    x4 / Left internal mammary artery to the LAD.  / Saphenous vein graft to the right posterolateral.  /  Saphenous vein graft to   the ramus intermedius. / Saphenous vein graft to the circumflex marginal     . Amputation  03/06/2012    Procedure: AMPUTATION RAY;  Surgeon: Alta Corning, MD;  Location: [facility];  Service: Orthopedics;  Laterality: Left;  First Ray Amputation  . Intramedullary (im) nail intertrochanteric Right 08/17/2013    Procedure: INTRAMEDULLARY (IM) NAIL INTERTROCHANTRIC;  Surgeon: Marybelle Killings, MD;  Location: [facility];  Service: Orthopedics;  Laterality: Right;  . Endarterectomy femoral Left 03/24/2014    Procedure: Left Common Femoral  Artery Endarterectomy with Vein Patch Angioplasty,  Ultrasound ;  Surgeon: Angelia Mould, MD;  Location: [facility];  Service: Vascular;  Laterality: Left;  . Toe amputation    . Lower extremity angiogram N/A 03/23/2014    Procedure: LOWER EXTREMITY ANGIOGRAM;  Surgeon: Lorretta Harp, MD;  Location: [facility];  Service: Cardiovascular;  Laterality: N/A;    History  Smoking status  . Former Smoker -- 1.00 packs/day for 20 years  . Types: Cigarettes  . Quit date: 11/20/1984  Smokeless tobacco  . Never Used    History  Alcohol Use No    Family History  Problem Relation Age of Onset  . Heart attack Mother   . Heart disease Father     Review of Systems: The review of systems is per the HPI.  All other systems were reviewed and are negative.  Physical Exam: BP 130/60 mmHg  Pulse 54  Ht _0  (1.778 m)  Wt 190 lb 8 oz (86.41 kg)  BMI 27.33 kg/m2 Patient is very pleasant, obese and in no acute distress. Skin is warm and dry. Color is normal.  HEENT is unremarkable. Normocephalic/atraumatic. PERRL. Sclera are nonicteric. Neck is supple. No masses. No JVD. Lungs  are clear. Cardiac exam shows a regular rate and rhythm. Normal S1-2. No gallop or murmur. Abdomen is soft. Extremities reveal 1+ edema. Gait and ROM are intact. No gross neurologic deficits noted.  LABORATORY DATA:  Lab Results  Component Value Date   WBC 4.9 12/29/2014   HGB 9.6* 12/29/2014    HCT 30.5* 12/29/2014   PLT 134* 12/29/2014   GLUCOSE 119 12/29/2014   CHOL 102 08/13/2013   TRIG 75 08/13/2013   HDL 37* 08/13/2013   LDLCALC 50 08/13/2013   ALT 22 12/29/2014   AST 25 12/29/2014   NA 139 12/29/2014   K 4.1 12/29/2014   CL 96 08/11/2014   CREATININE 1.4* 12/29/2014   BUN 37.2* 12/29/2014   CO2 21* 12/29/2014   TSH 3.01 11/11/2014   PSA 3.75 05/30/2006   INR 1.15 03/23/2014  BNP 138 in December.   Echo 12/09/13:Study Conclusions  - Left ventricle: Septal and apical hypokinesis posterior lateral and inferobasal akinesis. The cavity size was moderately dilated. Wall thickness was increased in a pattern of mild LVH. The estimated ejection fraction was 35%. - Mitral valve: There was mild regurgitation. - Left atrium: The atrium was moderately dilated. - Atrial septum: No defect or patent foramen ovale was identified.  Cardiology Nuclear Med Study  Timothy [name] is a 81 y.o. male MRN : [id number] DOB: 12-18-1927  Procedure Date: 11/24/2014  Nuclear Med Background Indication for Stress Test: Evaluation for Ischemia and Graft Patency History: CAD-CABG, 12/18/12 MPI: EF: 41% Scar minimal peri-infarct ischemia Cardiac Risk Factors: Hypertension  Symptoms: SOB   Nuclear Pre-Procedure Caffeine/Decaff Intake: None> 12 hrs NPO After: 7:30am   Lungs: clear O2 Sat: 97% on room air. IV 0.9% NS with Angio Cath: 22g  IV Site: R Hand x 1, tolerated well IV Started by: Irven Baltimore, RN  Chest Size (in): 46 Cup Size: n/a  Height: _1  (1.778 m)  Weight: 193 lb (87.544 kg)  BMI: Body mass index is 27.69 kg/(m^2). Tech Comments: Patient took Carvedilol this am. Irven Baltimore, RN.    Nuclear Med Study 1 or 2 day study: 1 day  Stress Test Type: Carlton Adam  Reading MD: N/A  Order Authorizing Provider: Denette Hass [place], MD  Resting Radionuclide: Technetium 89mSestamibi  Resting Radionuclide Dose: 11.0 mCi   Stress Radionuclide:  Technetium 969mestamibi  Stress Radionuclide Dose: 33.0 mCi      Stress Protocol Rest HR: 56 Stress HR: 76  Rest BP: 136/54 Stress BP: 127/65  Exercise Time (min): n/a METS: n/a   Predicted Max HR: 136 bpm % Max HR: 55.88 bpm Rate Pressure Product: 10336   Dose of Adenosine (mg): n/a Dose of Lexiscan: 0.4 mg  Dose of Atropine (mg): n/a Dose of Dobutamine: n/a mcg/kg/min (at max HR)  Stress Test Technologist: SaPerrin MalteseEMT-P  Nuclear Technologist: ElEarl ManyCNMT     Rest Procedure: Myocardial perfusion imaging was performed at rest 45 minutes following the intravenous administration of Technetium 9937mstamibi. Rest ECG: NSR - Normal EKG  Stress Procedure: The patient received IV Lexiscan 0.4 mg over 15-seconds. Technetium 48m27mtamibi injected at 30-seconds. This patient had sob, chest fullness, and was flushed with the LexiDevereux Children[facility] Behavioral Health Centerection. Quantitative spect images were obtained after a 45 minute delay. Stress ECG: No significant change from baseline ECG and There are scattered PVCs.  QPS Raw Data Images: Normal; no motion artifact; normal heart/lung ratio. Stress Images: Fixed inferior and apical defects Rest Images: Fixed inferior and apical defects Subtraction (SDS): No evidence of ischemia. Transient Ischemic Dilatation (Normal <1.22): 1.19 Lung/Heart Ratio (Normal <0.45): 0.36  Quantitative Gated Spect Images QGS EDV: 175 ml QGS ESV: 127 ml  Impression Exercise Capacity: Lexiscan with no exercise. BP Response: Normal blood pressure response. Clinical Symptoms: Mild chest pain/dyspnea. ECG Impression: No significant ECG changes with Lexiscan. Comparison with Prior Nuclear Study: EF 41% with inferior scar  Overall Impression: High risk stress nuclear study with moderate intensity, large size fixed inferior and apical scar. No reversible ischemia.  LV Ejection Fraction: 27%. LV Wall Motion: Global hypokinesis  with inferior akinesis/dyskinesis   KennPixie Casino, FACCSudlersvilletified in Nuclear Cardiology Attending Cardiologist [facility]    Assessment / Plan: 1. Dyspnea on exertion. This is  multifactorial. There is a component of  Chronic systolic congestive heart failure. His symptoms have improved some with diuretics. He is also anemic. There is a possibility of coronary ischemia since he is 14 years out from CABG but his recent myoview did not show definite ischemia. I have recommended continuing lasix 40 mg daily. I am reluctant to pursue heart cath at this time given the risk of contrast induced nephropathy.  I have recommended continuing his current therapy and follow up in 3 months.  2. Chronic systolic CHF. EF is stable by Echo at 35%. Patient has a known ischemic cardiomyopathy.  Continue lasix 40 mg daily and sodium and fluid restriction.  3. Multiple myeloma - followed by oncology. On Decadron and Cytoxan.  4. HLD - on statin therapy.   5. Coronary disease status post CABG in 2002. Dyspnea could be anginal equivalent symptoms and in fact this is how he initially presented.  Myoview study shows a large inferior and apical scar without ischemia.  Continue  medical therapy for now.  6. Hyponatremia. Stable with diuresis. Continue fluid restriction.  7. Anemia- chronic. On Aranesp as needed per oncology.

## 2014-12-31 LAB — PROTEIN ELECTROPHORESIS, SERUM
ALBUMIN ELP: 56.1 % (ref 55.8–66.1)
Alpha-1-Globulin: 6.5 % — ABNORMAL HIGH (ref 2.9–4.9)
Alpha-2-Globulin: 13.1 % — ABNORMAL HIGH (ref 7.1–11.8)
Beta 2: 5.6 % (ref 3.2–6.5)
Beta Globulin: 6.2 % (ref 4.7–7.2)
Gamma Globulin: 12.5 % (ref 11.1–18.8)
TOTAL PROTEIN, SERUM ELECTROPHOR: 6 g/dL (ref 6.0–8.3)

## 2015-01-01 ENCOUNTER — Telehealth: Payer: Self-pay | Admitting: Oncology

## 2015-01-01 ENCOUNTER — Other Ambulatory Visit: Payer: Medicare Other

## 2015-01-01 ENCOUNTER — Ambulatory Visit (HOSPITAL_BASED_OUTPATIENT_CLINIC_OR_DEPARTMENT_OTHER): Payer: Medicare Other

## 2015-01-01 ENCOUNTER — Telehealth: Payer: Self-pay | Admitting: *Deleted

## 2015-01-01 ENCOUNTER — Ambulatory Visit (HOSPITAL_BASED_OUTPATIENT_CLINIC_OR_DEPARTMENT_OTHER): Payer: Medicare Other | Admitting: Oncology

## 2015-01-01 VITALS — BP 124/47 | HR 66 | Temp 97.4°F | Resp 18 | Ht 70.0 in | Wt 192.7 lb

## 2015-01-01 DIAGNOSIS — E222 Syndrome of inappropriate secretion of antidiuretic hormone: Secondary | ICD-10-CM

## 2015-01-01 DIAGNOSIS — I739 Peripheral vascular disease, unspecified: Secondary | ICD-10-CM

## 2015-01-01 DIAGNOSIS — I5023 Acute on chronic systolic (congestive) heart failure: Secondary | ICD-10-CM

## 2015-01-01 DIAGNOSIS — Z5112 Encounter for antineoplastic immunotherapy: Secondary | ICD-10-CM

## 2015-01-01 DIAGNOSIS — M869 Osteomyelitis, unspecified: Secondary | ICD-10-CM

## 2015-01-01 DIAGNOSIS — D630 Anemia in neoplastic disease: Secondary | ICD-10-CM

## 2015-01-01 DIAGNOSIS — D649 Anemia, unspecified: Secondary | ICD-10-CM

## 2015-01-01 DIAGNOSIS — I70209 Unspecified atherosclerosis of native arteries of extremities, unspecified extremity: Secondary | ICD-10-CM

## 2015-01-01 DIAGNOSIS — C9000 Multiple myeloma not having achieved remission: Secondary | ICD-10-CM

## 2015-01-01 DIAGNOSIS — N289 Disorder of kidney and ureter, unspecified: Secondary | ICD-10-CM

## 2015-01-01 DIAGNOSIS — N183 Chronic kidney disease, stage 3 (moderate): Secondary | ICD-10-CM

## 2015-01-01 DIAGNOSIS — L98499 Non-pressure chronic ulcer of skin of other sites with unspecified severity: Secondary | ICD-10-CM

## 2015-01-01 MED ORDER — DARBEPOETIN ALFA 300 MCG/0.6ML IJ SOSY
300.0000 ug | PREFILLED_SYRINGE | Freq: Once | INTRAMUSCULAR | Status: AC
  Administered 2015-01-01: 300 ug via SUBCUTANEOUS
  Filled 2015-01-01: qty 0.6

## 2015-01-01 MED ORDER — ONDANSETRON 8 MG/50ML IVPB (CHCC)
8.0000 mg | Freq: Once | INTRAVENOUS | Status: AC
  Administered 2015-01-01: 8 mg via INTRAVENOUS

## 2015-01-01 MED ORDER — SODIUM CHLORIDE 0.9 % IV SOLN
300.0000 mg/m2 | Freq: Once | INTRAVENOUS | Status: AC
  Administered 2015-01-01: 620 mg via INTRAVENOUS
  Filled 2015-01-01: qty 31

## 2015-01-01 MED ORDER — DEXAMETHASONE SODIUM PHOSPHATE 10 MG/ML IJ SOLN
4.0000 mg | Freq: Once | INTRAMUSCULAR | Status: AC
  Administered 2015-01-01: 4 mg via INTRAVENOUS

## 2015-01-01 MED ORDER — DEXAMETHASONE SODIUM PHOSPHATE 10 MG/ML IJ SOLN
INTRAMUSCULAR | Status: AC
  Filled 2015-01-01: qty 1

## 2015-01-01 MED ORDER — ONDANSETRON 8 MG/NS 50 ML IVPB
INTRAVENOUS | Status: AC
  Filled 2015-01-01: qty 8

## 2015-01-01 MED ORDER — SODIUM CHLORIDE 0.9 % IV SOLN
Freq: Once | INTRAVENOUS | Status: AC
  Administered 2015-01-01: 11:00:00 via INTRAVENOUS

## 2015-01-01 NOTE — Telephone Encounter (Signed)
Per staff message and POF I have scheduled appts. Advised scheduler of appts. JMW  

## 2015-01-01 NOTE — Patient Instructions (Signed)
[facility] Discharge Instructions for Patients Receiving Chemotherapy  Today you received the following chemotherapy agents cytoxan  To help prevent nausea and vomiting after your treatment, we encourage you to take your nausea medication as directed   If you develop nausea and vomiting that is not controlled by your nausea medication, call the clinic.   BELOW ARE SYMPTOMS THAT SHOULD BE REPORTED IMMEDIATELY:  *FEVER GREATER THAN 100.5 F  *CHILLS WITH OR WITHOUT FEVER  NAUSEA AND VOMITING THAT IS NOT CONTROLLED WITH YOUR NAUSEA MEDICATION  *UNUSUAL SHORTNESS OF BREATH  *UNUSUAL BRUISING OR BLEEDING  TENDERNESS IN MOUTH AND THROAT WITH OR WITHOUT PRESENCE OF ULCERS  *URINARY PROBLEMS  *BOWEL PROBLEMS  UNUSUAL RASH Items with * indicate a potential emergency and should be followed up as soon as possible.  Feel free to call the clinic you have any questions or concerns. The clinic phone number is [phone].

## 2015-01-01 NOTE — Telephone Encounter (Signed)
per pof to sch pt appt-sent MW email to sch pt appt-will call pt once reply

## 2015-01-01 NOTE — Telephone Encounter (Signed)
cld & left pt a message of next appt time & dtae

## 2015-01-03 NOTE — Progress Notes (Signed)
ID: Timothy [name]   DOB: September 07, 1928  MR#: [id number]  WLN#:[id number]  PCP:  SU: OTHER MD: Dorna Leitz, Alysia Penna, Georgia Duff, Geryl Councilman  CHIEF COMPLAINT: multiple myeloma CURRENT THERAPY: cyclophosphamide, dexamethasone, darbepoetin   MYELOMA HISTORY: From the earlier summary:  Patient presented in April 2007 with symptomatic slowly progressive anemia, with normal iron parameters, folic acid, and vitamin B12. Plans were made to obtain bone marrow aspirate and biopsy as an outpatient, however the patient was hospitalized shortly thereafter with a creatinine of 8.6. Renal biopsy demonstrated the presence of  light chain deposition disease. Bone marrow biopsy demonstrated 37% plasma cells. A skeletal survey showed small calvarial lytic lesions, mild osteopenia, and cervical spondylosis. With a well-established diagnosis of light-chain multiple myeloma Dr. Melodie Bouillon subsequent treatments are as detailed below.   INTERVAL HISTORY: Timothy [name] returns today with his wife, Timothy [name], for follow up of his multiple myeloma. Today is day 1 cycle 7 of his every 2 week Cytoxan treatments. He also takes dexamethasone 20 mg 2 days a week. He also receives Aranesp every 2 weeks, with his Cytoxan treatments. He is tolerating this Cytoxan and dexamethasone with no side effects that he is aware of, and in particular no nausea, vomiting, fatigue, or evidence of thrush or intercurrent infection.  REVIEW OF SYSTEMS: Timothy [name]'s functional status remains poor. He uses a walker at home. He really does not get out of the house much except to go to the doctors anymore. His shortness of breath is improved on his current regimen of water restriction and Lasix. On the other hand his EGFR has dropped a little further, to just under 50. He denies unusual headaches, visual changes, nausea, vomiting, gait imbalance, or intercurrent falls. There has been no  change in bowel habits. We have been no fever, rash, or bleeding. A detailed review of systems was otherwise stable  PAST MEDICAL HISTORY: Past Medical History  Diagnosis Date  . ASCVD (arteriosclerotic cardiovascular disease)   . Anemia     chronic mild anemia  . Gout   . Hypertension   . Lumbar disc disease     post laminectomy  . Chronic back pain   . History of echocardiogram 11/02/2010    EF range of 30 to 35% / There is hypokinsesis of the basal-mild inferolateral myocardium  . CHF (congestive heart failure)   . Heart murmur   . Ischemic cardiomyopathy   . Renal insufficiency   . Peripheral neuropathy   . Inferior myocardial infarction 1986  . Hyperlipidemia   . SOB (shortness of breath)   . Fatigue   . Peripheral neuropathy   . Coronary artery disease     CABG 2002 by Dr. Roxy Manns with LIMA to LAD, SVG to intermediate, SVG to LCX, & SVG to PL. Last nuclear in 2012 showing large inferior scar with EF of 39%.   . Blood transfusion     DEC 2012 - TWO UNITS  . Myeloma     Multiple myeloma, with recurrence of increasing problems  DR. Blandon -ONCOLOGIST.  PT HAS BEEN OFF CHEMO SINCE HIS HOSPITALIZATION FEB 2013 FOR LEFT FOOT INFECTION  . Cancer   . History of shingles MARCH 2009    SHINGLE LESIONS WERE AROUND RIGHT EYE--PT HAS RESIDUAL ITCHING AROUND THE EYE.  . Osteomyelitis     s/p toe amputation  . Chronic systolic heart failure     EF of 30% per echo 02/2012  . Protein malnutrition   .  Critical lower limb ischemia   . Peripheral vascular disease   . Hx of cardiovascular stress test     Lexiscan Myoview (1/16):  Large inferior and apical scar; no ischemia, EF 27%;  High Risk   PAST SURGICAL HISTORY: Past Surgical History  Procedure Laterality Date  . Laminectomy    . Inguinal hernia repair    . Cardiac catheterization  06/20/2000    Severe coronary disease (totally occluded right artery, 90% left circumflex, 50-60% intermediate, and 90-95% ostial left anterior  descending)   . Tonsillectomy    . Coronary artery bypass graft  2002    x4 / Left internal mammary artery to the LAD.  / Saphenous vein graft to the right posterolateral.  /  Saphenous vein graft to  the ramus intermedius. / Saphenous vein graft to the circumflex marginal     . Amputation  03/06/2012    Procedure: AMPUTATION RAY;  Surgeon: Alta Corning, MD;  Location: [facility];  Service: Orthopedics;  Laterality: Left;  First Ray Amputation  . Intramedullary (im) nail intertrochanteric Right 08/17/2013    Procedure: INTRAMEDULLARY (IM) NAIL INTERTROCHANTRIC;  Surgeon: Marybelle Killings, MD;  Location: [facility];  Service: Orthopedics;  Laterality: Right;  . Endarterectomy femoral Left 03/24/2014    Procedure: Left Common Femoral  Artery Endarterectomy with Vein Patch Angioplasty,  Ultrasound ;  Surgeon: Angelia Mould, MD;  Location: [facility];  Service: Vascular;  Laterality: Left;  . Toe amputation    . Lower extremity angiogram N/A 03/23/2014    Procedure: LOWER EXTREMITY ANGIOGRAM;  Surgeon: Lorretta Harp, MD;  Location: [facility] CATH LAB;  Service: Cardiovascular;  Laterality: N/A;    FAMILY HISTORY No family history of hematologic malignancies; brother had prostate cancer; no other cancers in the immediate family  SOCIAL HISTORY: Retired Animal nutritionist; children from prior marriage. He and his wife Timothy [name] are the only ones at home.    ADVANCED DIRECTIVES: Timothy [name] has wife is his healthcare part of attorney  HEALTH MAINTENANCE: History  Substance Use Topics  . Smoking status: Former Smoker -- 1.00 packs/day for 20 years    Types: Cigarettes    Quit date: 11/20/1984  . Smokeless tobacco: Never Used  . Alcohol Use: No     Colonoscopy:  Bone density:  Lipid panel:  Allergies  Allergen Reactions  . Morphine And Related Other (See Comments)    Agitated with morphine drip    Current Outpatient Prescriptions  Medication Sig Dispense Refill  . acyclovir (ZOVIRAX) 400 MG tablet Take 1  tablet (400 mg total) by mouth 2 (two) times daily. 60 tablet 3  . allopurinol (ZYLOPRIM) 300 MG tablet Take 0.5 tablets (150 mg total) by mouth daily. 90 tablet 3  . amLODipine (NORVASC) 2.5 MG tablet Take 1 tablet (2.5 mg total) by mouth daily. 90 tablet 3  . aspirin EC 81 MG tablet Take 81 mg by mouth 3 (three) times a week. Monday, Wednesday, Friday    . atorvastatin (LIPITOR) 10 MG tablet Take 1 tablet (10 mg total) by mouth every evening. 90 tablet 3  . B Complex-Biotin-FA (B COMPLETE PO) Take 1 tablet by mouth daily.     . carvedilol (COREG) 6.25 MG tablet Take 1 tablet (6.25 mg total) by mouth 2 (two) times daily with a meal. 180 tablet 3  . cephALEXin (KEFLEX) 500 MG capsule Take 1 capsule (500 mg total) by mouth 3 (three) times daily. 30 capsule 0  . Coenzyme Q10 (CO Q-10 PO) Take  1 tablet by mouth every morning.     . darbepoetin (ARANESP) 200 MCG/0.4ML SOLN injection Inject 200 mcg into the skin as directed.    Marland Kitchen dexamethasone (DECADRON) 4 MG tablet Take five tablets 2 consecutive days every week 40 tablet 6  . diphenoxylate-atropine (LOMOTIL) 2.5-0.025 MG per tablet Take 1 tablet by mouth 4 (four) times daily as needed for diarrhea or loose stools. 30 tablet 0  . furosemide (LASIX) 40 MG tablet Take 1 tablet (40 mg total) by mouth daily. 30 tablet 3  . HYDROmorphone (DILAUDID) 4 MG tablet Take 1 tablet (4 mg total) by mouth every 4 (four) hours as needed for severe pain. 30 tablet 0  . isosorbide mononitrate (IMDUR) 30 MG 24 hr tablet Take 1 tablet (30 mg total) by mouth daily. 90 tablet 3  . lisinopril (PRINIVIL,ZESTRIL) 20 MG tablet Take 20 mg by mouth daily.    . Multiple Vitamin (MULTIVITAMIN) capsule Take 1 capsule by mouth every morning.     . nitroGLYCERIN (NITROSTAT) 0.4 MG SL tablet Place 1 tablet (0.4 mg total) under the tongue every 5 (five) minutes as needed for chest pain. 25 tablet 3  . ondansetron (ZOFRAN) 8 MG tablet Take 1 tablet (8 mg total) by mouth 2 (two) times  daily as needed (Nausea or vomiting). 30 tablet 1  . polyethylene glycol (MIRALAX / GLYCOLAX) packet Take 17 g by mouth daily as needed. 14 each 0  . rOPINIRole (REQUIP) 3 MG tablet Take 1 tablet (3 mg total) by mouth at bedtime. 90 tablet 4  . silver sulfADIAZINE (SILVADENE) 1 % cream Apply 1 application topically daily. 50 g 0  . tamsulosin (FLOMAX) 0.4 MG CAPS capsule Take 0.4 mg by mouth at bedtime.    Marland Kitchen tobramycin (TOBREX) 0.3 % ophthalmic solution Place 1 drop into both eyes daily as needed (for Bacterial eye infection).     . vitamin C (ASCORBIC ACID) 500 MG tablet Take 500 mg by mouth every morning.     No current facility-administered medications for this visit.    Objective: Older white man who climbs on the examining table with some difficulty Filed Vitals:   01/01/15 1013  BP: 124/47  Pulse: 66  Temp: 97.4 F (36.3 C)  Resp: 18        Body mass index is 27.65 kg/(m^2).    ECOG FS: 2 Filed Weights   01/01/15 1013  Weight: 192 lb 11.2 oz (87.408 kg)   Sclerae unicteric, EOMs intact Oropharynx clear and moist-- no thrush No cervical or supraclavicular adenopathy Lungs no rales or rhonchi Heart regular rate and rhythm Abd soft, nontender, positive bowel sounds MSK minimal tenderness to palpation over the lumbar spine, no upper extremity lymphedema Neuro: nonfocal, well oriented, appropriate affect     LAB RESULTS:  Results for JAHEIM, CANINO (MRN [id number]) as of 01/03/2015 10:03  Ref. Range 09/25/2014 09:48 10/09/2014 09:50 10/23/2014 13:08 12/01/2014 11:19 12/29/2014 10:35  M-SPIKE, % Latest Units: g/dL  0.45  NOT DET NOT DET  Results for CHILTON, SALLADE (MRN [id number]) as of 01/03/2015 10:03  Ref. Range 09/25/2014 09:48 10/09/2014 09:50 10/23/2014 13:08 12/01/2014 11:19 12/29/2014 10:35  Kappa:Lambda Ratio Latest Range: 0.26-1.65  49.63 (H)  26.51 (H) 31.36 (H)   Results for TRISTIAN, SICKINGER (MRN [id number]) as of 01/03/2015 10:03  Ref. Range 11/06/2014 10:14 11/19/2014  10:18 12/01/2014 11:18 12/18/2014 10:33 12/29/2014 10:34  Retic Ct Abs Latest Range: 34.80-93.90 10e3/uL 69.54  84.46  81.18  Results for Edsall,  MIA WINTHROP (MRN [id number]) as of 01/03/2015 10:03  Ref. Range 09/25/2014 09:48 10/23/2014 13:08 11/06/2014 10:35 12/01/2014 11:19 12/29/2014 10:35  Sodium Latest Range: 136-145 mEq/L 136 136 137 133 (L) 139     Lab Results  Component Value Date   WBC 4.9 12/29/2014   NEUTROABS 3.9 12/29/2014   HGB 9.6* 12/29/2014   HCT 30.5* 12/29/2014   MCV 88.7 12/29/2014   PLT 134* 12/29/2014      Chemistry      Component Value Date/Time   NA 139 12/29/2014 1035   NA 126* 08/11/2014 1052   K 4.1 12/29/2014 1035   K 4.0 08/11/2014 1052   CL 96 08/11/2014 1052   CL 98 05/12/2013 1048   CO2 21* 12/29/2014 1035   CO2 24 08/11/2014 1052   BUN 37.2* 12/29/2014 1035   BUN 12 08/11/2014 1052   CREATININE 1.4* 12/29/2014 1035   CREATININE 1.0 08/11/2014 1052      Component Value Date/Time   CALCIUM 8.8 12/29/2014 1035   CALCIUM 9.0 08/11/2014 1052   ALKPHOS 48 12/29/2014 1035   ALKPHOS 42 03/25/2014 2115   AST 25 12/29/2014 1035   AST 25 03/25/2014 2115   ALT 22 12/29/2014 1035   ALT 10 03/25/2014 2115   BILITOT 0.58 12/29/2014 1035   BILITOT 0.7 03/25/2014 2115      STUDIES: Nuclear medicine cardiology study 11/24/2014: High risk stress nuclear study with moderate intensity, large size fixed inferior and apical scar. No reversible ischemia. LV Ejection Fraction: 27%. LV Wall Motion: Global hypokinesis with inferior akinesis/dyskinesis   ASSESSMENT: 81 y.o.  with kappa light chain multiple myeloma   (1) presenting April 2007 with anemia and renal failure; renal Bx showing free kappa light chain deposition; bone marrow biopsy showing 37% plasma cells, with normal cytogenetics and FISH;bone survey showing skull lytic lesions; treated with   (2) thalidomide (200 mg/d) and dexamethasone (40 mg/d x4d Q28d) June through Nov 2007, with good response, but  poor tolerance;   (3) thalidomide decreased to 100 mg/d, dexamethasone continued, bortezomib (IV) added, Dec 2007 to March 2008   (4) treatment interrupted by multiple complications (peripheral neuropathy, pulmonary embolism, diverticular abscess, CN V zoster, severe DDD, congestive heart failure, rising PSA); maintenance zolendronic acid through March 2012   (5) progression April 2012, treated initially with dexamethasone alone, poorly tolerated;   (6) bortezomib (sq) resumed July 2012; cyclophosphamide and dexamethasone added Sept 2012; zolendronic acid changed to Q 8month; all treatments held as of March 2013 due to the development of the Left foot ulcer described above  (7) anemia, on aranesp December 2012 to August 2013; resumed Q14d starting May 2015, with slowly rising reticulocyte count  (8) status post Right trochanteric nail with proximal and distal interlock DePuy 11 mm one-pin lag screw, 44 mm distal interlock 08/09/2013 for a right comminuted intertrochanteric fracture  (9) status post removal of the first 2 toes left foot  (10) Left common femoral artery endarterectomy with vein patch angioplasty using left greater saphenous vein 03/24/2014  (11) status post fall with injury to the right hip, large hematoma, [facility]  (12) on 10/09/2014 starting cyclophosphamide at 300 mg/m every 14 days and dexamethasone 20 mg daily 2 days each week  (13) SIADH: On fluid restriction, reduced to 1.5L on 12/04/14 because of drop in sodium  (14) chronic systolic congestive heart failure, with EF  35% per echo, with history CAD s/p CABG 2002  (15) CKD stage III  PLAN:   Timothy [name] is tolerating treatment of his myeloma well. He appears to be responding, with disappearance of his very minimal M spike and some decrease in his light chains, although we do not yet have the most recent reading.  We discussed the fact that his kidney function is a  concern. On the one hand we can't hydrate him aggressively because of his history of SIADH and congestive heart failure. In fact he is on fluid restriction and diuretics. On the other hand he is at risk for further deterioration of his kidney function because of light chain myeloma.  Today I asked him whether if his kidneys failed he would want to go on dialysis. He was vehement that he would not. However Timothy [name], who is his healthcare power of attorney, felt that he should be on it if necessary. This was only the beginning of a conversation, but I do think that given the multiple comorbid conditions affecting Timothy [name]'s quality of life, whether or not he would want to be on dialysis at some point should be considered and preferably before he has to make an emergent dissection.  Otherwise we are continuing the cyclophosphamide and dexamethasone as before. He also receives darbepoetin every 2 weeks . With a good reticulocyte response. We will be repeating his kappa light chains to 26 and 311, then every 28 days thereafter.  MAGRINAT,GUSTAV C    01/03/2015

## 2015-01-07 ENCOUNTER — Ambulatory Visit: Payer: Medicare Other

## 2015-01-07 ENCOUNTER — Other Ambulatory Visit: Payer: Medicare Other

## 2015-01-07 ENCOUNTER — Ambulatory Visit: Payer: Medicare Other | Admitting: Oncology

## 2015-01-08 ENCOUNTER — Ambulatory Visit (INDEPENDENT_AMBULATORY_CARE_PROVIDER_SITE_OTHER): Payer: Medicare Other | Admitting: Podiatrist

## 2015-01-08 ENCOUNTER — Encounter: Payer: Self-pay | Admitting: Podiatrist

## 2015-01-08 VITALS — BP 128/58 | HR 82 | Resp 16

## 2015-01-08 DIAGNOSIS — I739 Peripheral vascular disease, unspecified: Secondary | ICD-10-CM

## 2015-01-08 DIAGNOSIS — L89891 Pressure ulcer of other site, stage 1: Secondary | ICD-10-CM

## 2015-01-08 DIAGNOSIS — L97529 Non-pressure chronic ulcer of other part of left foot with unspecified severity: Secondary | ICD-10-CM

## 2015-01-08 DIAGNOSIS — R0989 Other specified symptoms and signs involving the circulatory and respiratory systems: Secondary | ICD-10-CM

## 2015-01-08 NOTE — Progress Notes (Signed)
Patient presents today for follow up of left 3rd toe.  An ulcer has occurred and there is a hole at the tip of the toe per his wife.  He denies pain to the toe.  States he has been wearing the small buttress pad as instructed.  Objective: Neurovascular status unchanged with decreased pulses noted.Marland Kitchen Ulcer is present distal to the left third toe. No probing to bone is noted. Fiber granular base is present. No malodor. Slight undermining at the 12:00 position is noted that very minimal. Hyperkeratotic tissue is surrounding. Measurements today carried out measured in millimeters in diameter 2 mm in depth. Discomfort on the plantar lateral aspect of the left foot is also palpated submetatarsal 5 with a slight hyperkeratotic lesion overlying.   Assessment: Ulceration distal tip left third toe, hyperkeratotic lesion sub-metatarsal for 5 left  Plan: Iodosorb and a dressing was applied. Recommended continuation of Iodosorb at home. His pain was also applied. Recommended open toed shoes as well as the pressure of the toe is hitting the end of the shoe causing it to occur. See him back in 2 weeks for recheck.

## 2015-01-13 ENCOUNTER — Other Ambulatory Visit: Payer: Self-pay

## 2015-01-13 MED ORDER — FUROSEMIDE 40 MG PO TABS: 40.0000 mg | ORAL_TABLET | Freq: Every day | ORAL | Status: DC

## 2015-01-15 ENCOUNTER — Ambulatory Visit (HOSPITAL_BASED_OUTPATIENT_CLINIC_OR_DEPARTMENT_OTHER): Payer: Medicare Other

## 2015-01-15 ENCOUNTER — Encounter: Payer: Self-pay | Admitting: Nurse Practitioner

## 2015-01-15 ENCOUNTER — Ambulatory Visit (HOSPITAL_BASED_OUTPATIENT_CLINIC_OR_DEPARTMENT_OTHER): Payer: Medicare Other | Admitting: Nurse Practitioner

## 2015-01-15 ENCOUNTER — Other Ambulatory Visit (HOSPITAL_BASED_OUTPATIENT_CLINIC_OR_DEPARTMENT_OTHER): Payer: Medicare Other

## 2015-01-15 VITALS — BP 126/50 | HR 64 | Temp 98.0°F | Resp 18 | Ht 70.0 in | Wt 191.0 lb

## 2015-01-15 DIAGNOSIS — D631 Anemia in chronic kidney disease: Secondary | ICD-10-CM

## 2015-01-15 DIAGNOSIS — I509 Heart failure, unspecified: Secondary | ICD-10-CM

## 2015-01-15 DIAGNOSIS — I70209 Unspecified atherosclerosis of native arteries of extremities, unspecified extremity: Secondary | ICD-10-CM

## 2015-01-15 DIAGNOSIS — I739 Peripheral vascular disease, unspecified: Secondary | ICD-10-CM

## 2015-01-15 DIAGNOSIS — D630 Anemia in neoplastic disease: Secondary | ICD-10-CM

## 2015-01-15 DIAGNOSIS — E222 Syndrome of inappropriate secretion of antidiuretic hormone: Secondary | ICD-10-CM

## 2015-01-15 DIAGNOSIS — N183 Chronic kidney disease, stage 3 (moderate): Secondary | ICD-10-CM

## 2015-01-15 DIAGNOSIS — Z5111 Encounter for antineoplastic chemotherapy: Secondary | ICD-10-CM | POA: Diagnosis not present

## 2015-01-15 DIAGNOSIS — C9000 Multiple myeloma not having achieved remission: Secondary | ICD-10-CM

## 2015-01-15 DIAGNOSIS — N289 Disorder of kidney and ureter, unspecified: Secondary | ICD-10-CM

## 2015-01-15 DIAGNOSIS — R0602 Shortness of breath: Secondary | ICD-10-CM

## 2015-01-15 DIAGNOSIS — L98499 Non-pressure chronic ulcer of skin of other sites with unspecified severity: Principal | ICD-10-CM

## 2015-01-15 DIAGNOSIS — M869 Osteomyelitis, unspecified: Secondary | ICD-10-CM

## 2015-01-15 LAB — CBC WITH DIFFERENTIAL/PLATELET
BASO%: 0.9 % (ref 0.0–2.0)
Basophils Absolute: 0 10*3/uL (ref 0.0–0.1)
EOS%: 1.4 % (ref 0.0–7.0)
Eosinophils Absolute: 0.1 10*3/uL (ref 0.0–0.5)
HCT: 29.8 % — ABNORMAL LOW (ref 38.4–49.9)
HGB: 9.5 g/dL — ABNORMAL LOW (ref 13.0–17.1)
LYMPH%: 17.2 % (ref 14.0–49.0)
MCH: 27.9 pg (ref 27.2–33.4)
MCHC: 31.9 g/dL — ABNORMAL LOW (ref 32.0–36.0)
MCV: 87.4 fL (ref 79.3–98.0)
MONO#: 0.7 10*3/uL (ref 0.1–0.9)
MONO%: 16.9 % — ABNORMAL HIGH (ref 0.0–14.0)
NEUT#: 2.8 10*3/uL (ref 1.5–6.5)
NEUT%: 63.6 % (ref 39.0–75.0)
Platelets: 142 10*3/uL (ref 140–400)
RBC: 3.41 10*6/uL — ABNORMAL LOW (ref 4.20–5.82)
RDW: 20.5 % — ABNORMAL HIGH (ref 11.0–14.6)
WBC: 4.4 10*3/uL (ref 4.0–10.3)
lymph#: 0.8 10*3/uL — ABNORMAL LOW (ref 0.9–3.3)
nRBC: 0 % (ref 0–0)

## 2015-01-15 MED ORDER — ONDANSETRON 8 MG/50ML IVPB (CHCC)
8.0000 mg | Freq: Once | INTRAVENOUS | Status: AC
  Administered 2015-01-15: 8 mg via INTRAVENOUS

## 2015-01-15 MED ORDER — ONDANSETRON 8 MG/NS 50 ML IVPB
INTRAVENOUS | Status: AC
  Filled 2015-01-15: qty 8

## 2015-01-15 MED ORDER — DEXAMETHASONE SODIUM PHOSPHATE 10 MG/ML IJ SOLN
4.0000 mg | Freq: Once | INTRAMUSCULAR | Status: AC
  Administered 2015-01-15: 4 mg via INTRAVENOUS

## 2015-01-15 MED ORDER — SODIUM CHLORIDE 0.9 % IV SOLN
300.0000 mg/m2 | Freq: Once | INTRAVENOUS | Status: AC
  Administered 2015-01-15: 620 mg via INTRAVENOUS
  Filled 2015-01-15: qty 31

## 2015-01-15 MED ORDER — SODIUM CHLORIDE 0.9 % IV SOLN
Freq: Once | INTRAVENOUS | Status: AC
  Administered 2015-01-15: 12:00:00 via INTRAVENOUS

## 2015-01-15 MED ORDER — DEXAMETHASONE SODIUM PHOSPHATE 10 MG/ML IJ SOLN
INTRAMUSCULAR | Status: AC
  Filled 2015-01-15: qty 1

## 2015-01-15 MED ORDER — DARBEPOETIN ALFA 300 MCG/0.6ML IJ SOSY
300.0000 ug | PREFILLED_SYRINGE | Freq: Once | INTRAMUSCULAR | Status: AC
  Administered 2015-01-15: 300 ug via SUBCUTANEOUS
  Filled 2015-01-15: qty 0.6

## 2015-01-15 NOTE — Progress Notes (Signed)
ID: Timothy [name]   DOB: September 22, 1928  MR#: [id number]  XBJ#:[id number]  PCP:  SU: OTHER MD: Dorna Leitz, Alysia Penna, Georgia Duff, Geryl Councilman  CHIEF COMPLAINT: multiple myeloma CURRENT THERAPY: cyclophosphamide, dexamethasone, darbepoetin   MYELOMA HISTORY: From the earlier summary:  Patient presented in April 2007 with symptomatic slowly progressive anemia, with normal iron parameters, folic acid, and vitamin B12. Plans were made to obtain bone marrow aspirate and biopsy as an outpatient, however the patient was hospitalized shortly thereafter with a creatinine of 8.6. Renal biopsy demonstrated the presence of  light chain deposition disease. Bone marrow biopsy demonstrated 37% plasma cells. A skeletal survey showed small calvarial lytic lesions, mild osteopenia, and cervical spondylosis. With a well-established diagnosis of light-chain multiple myeloma Dr. Melodie Bouillon subsequent treatments are as detailed below.   INTERVAL HISTORY: Timothy [name] returns today with his wife, [name], for follow up of his multiple myeloma. Today is day 1 cycle 8 of his every 2 week Cytoxan treatments. He also takes dexamethasone 20 mg 2 days a week. He also receives Aranesp every 2 weeks, with his Cytoxan treatments. He continues to tolerate therapy well with no complaints.  His back pain is stable use of dilaudid once daily. He uses a walker around in public but does some "free walking" without it at home.   REVIEW OF SYSTEMS: Timothy [name] denies fevers, chills, nausea, or vomiting. He takes a combination of stool softeners and miralax to avoid constipation. His appetite is "too good." He denies mouth sores, rashes, or incidences of thrush. He bruises easily, but has no bleeding. He has residual neuropathy from previous treatments, and this is unchanged. He is chronically short of breath, but it has improved some. He denies chest pain, cough, or  palpitations. His energy level is fair. He sleeps ok at night despite the steroids. He denies headache, dizziness, or loss of balance. A detailed review of systems is otherwise stable.  PAST MEDICAL HISTORY: Past Medical History  Diagnosis Date  . ASCVD (arteriosclerotic cardiovascular disease)   . Anemia     chronic mild anemia  . Gout   . Hypertension   . Lumbar disc disease     post laminectomy  . Chronic back pain   . History of echocardiogram 11/02/2010    EF range of 30 to 35% / There is hypokinsesis of the basal-mild inferolateral myocardium  . CHF (congestive heart failure)   . Heart murmur   . Ischemic cardiomyopathy   . Renal insufficiency   . Peripheral neuropathy   . Inferior myocardial infarction 1986  . Hyperlipidemia   . SOB (shortness of breath)   . Fatigue   . Peripheral neuropathy   . Coronary artery disease     CABG 2002 by Dr. Roxy Manns with LIMA to LAD, SVG to intermediate, SVG to LCX, & SVG to PL. Last nuclear in 2012 showing large inferior scar with EF of 39%.   . Blood transfusion     DEC 2012 - TWO UNITS  . Myeloma     Multiple myeloma, with recurrence of increasing problems  DR. [facility] -ONCOLOGIST.  PT HAS BEEN OFF CHEMO SINCE HIS HOSPITALIZATION FEB 2013 FOR LEFT FOOT INFECTION  . Cancer   . History of shingles MARCH 2009    SHINGLE LESIONS WERE AROUND RIGHT EYE--PT HAS RESIDUAL ITCHING AROUND THE EYE.  . Osteomyelitis     s/p toe amputation  . Chronic systolic heart failure     EF  of 30% per echo 02/2012  . Protein malnutrition   . Critical lower limb ischemia   . Peripheral vascular disease   . Hx of cardiovascular stress test     Lexiscan Myoview (1/16):  Large inferior and apical scar; no ischemia, EF 27%;  High Risk   PAST SURGICAL HISTORY: Past Surgical History  Procedure Laterality Date  . Laminectomy    . Inguinal hernia repair    . Cardiac catheterization  06/20/2000    Severe coronary disease (totally occluded right artery, 90% left  circumflex, 50-60% intermediate, and 90-95% ostial left anterior descending)   . Tonsillectomy    . Coronary artery bypass graft  2002    x4 / Left internal mammary artery to the LAD.  / Saphenous vein graft to the right posterolateral.  /  Saphenous vein graft to  the ramus intermedius. / Saphenous vein graft to the circumflex marginal     . Amputation  03/06/2012    Procedure: AMPUTATION RAY;  Surgeon: Alta Corning, MD;  Location: [facility];  Service: Orthopedics;  Laterality: Left;  First Ray Amputation  . Intramedullary (im) nail intertrochanteric Right 08/17/2013    Procedure: INTRAMEDULLARY (IM) NAIL INTERTROCHANTRIC;  Surgeon: Marybelle Killings, MD;  Location: [facility];  Service: Orthopedics;  Laterality: Right;  . Endarterectomy femoral Left 03/24/2014    Procedure: Left Common Femoral  Artery Endarterectomy with Vein Patch Angioplasty,  Ultrasound ;  Surgeon: Angelia Mould, MD;  Location: [facility];  Service: Vascular;  Laterality: Left;  . Toe amputation    . Lower extremity angiogram N/A 03/23/2014    Procedure: LOWER EXTREMITY ANGIOGRAM;  Surgeon: Lorretta Harp, MD;  Location: [facility] CATH LAB;  Service: Cardiovascular;  Laterality: N/A;    FAMILY HISTORY No family history of hematologic malignancies; brother had prostate cancer; no other cancers in the immediate family  SOCIAL HISTORY: Retired Animal nutritionist; children from prior marriage. He and his wife [name] are the only ones at home.    ADVANCED DIRECTIVES: [name] has wife is his healthcare part of attorney  HEALTH MAINTENANCE: History  Substance Use Topics  . Smoking status: Former Smoker -- 1.00 packs/day for 20 years    Types: Cigarettes    Quit date: 11/20/1984  . Smokeless tobacco: Never Used  . Alcohol Use: No     Colonoscopy:  Bone density:  Lipid panel:  Allergies  Allergen Reactions  . Morphine And Related Other (See Comments)    Agitated with morphine drip    Current Outpatient Prescriptions  Medication Sig  Dispense Refill  . acyclovir (ZOVIRAX) 400 MG tablet Take 1 tablet (400 mg total) by mouth 2 (two) times daily. 60 tablet 3  . allopurinol (ZYLOPRIM) 300 MG tablet Take 0.5 tablets (150 mg total) by mouth daily. 90 tablet 3  . amLODipine (NORVASC) 2.5 MG tablet Take 1 tablet (2.5 mg total) by mouth daily. 90 tablet 3  . aspirin EC 81 MG tablet Take 81 mg by mouth 3 (three) times a week. Monday, Wednesday, Friday    . atorvastatin (LIPITOR) 10 MG tablet Take 1 tablet (10 mg total) by mouth every evening. 90 tablet 3  . B Complex-Biotin-FA (B COMPLETE PO) Take 1 tablet by mouth daily.     . carvedilol (COREG) 6.25 MG tablet Take 1 tablet (6.25 mg total) by mouth 2 (two) times daily with a meal. 180 tablet 3  . Coenzyme Q10 (CO Q-10 PO) Take 1 tablet by mouth every morning.     Marland Kitchen  darbepoetin (ARANESP) 200 MCG/0.4ML SOLN injection Inject 200 mcg into the skin as directed.    Marland Kitchen dexamethasone (DECADRON) 4 MG tablet Take five tablets 2 consecutive days every week 40 tablet 6  . furosemide (LASIX) 40 MG tablet Take 1 tablet (40 mg total) by mouth daily. 90 tablet 1  . HYDROmorphone (DILAUDID) 4 MG tablet Take 1 tablet (4 mg total) by mouth every 4 (four) hours as needed for severe pain. 30 tablet 0  . isosorbide mononitrate (IMDUR) 30 MG 24 hr tablet Take 1 tablet (30 mg total) by mouth daily. 90 tablet 3  . lisinopril (PRINIVIL,ZESTRIL) 20 MG tablet Take 20 mg by mouth daily.    . Multiple Vitamin (MULTIVITAMIN) capsule Take 1 capsule by mouth every morning.     . ondansetron (ZOFRAN) 8 MG tablet Take 1 tablet (8 mg total) by mouth 2 (two) times daily as needed (Nausea or vomiting). 30 tablet 1  . rOPINIRole (REQUIP) 3 MG tablet Take 1 tablet (3 mg total) by mouth at bedtime. 90 tablet 4  . silver sulfADIAZINE (SILVADENE) 1 % cream Apply 1 application topically daily. 50 g 0  . tamsulosin (FLOMAX) 0.4 MG CAPS capsule Take 0.4 mg by mouth at bedtime.    . vitamin C (ASCORBIC ACID) 500 MG tablet Take 500  mg by mouth every morning.    . diphenoxylate-atropine (LOMOTIL) 2.5-0.025 MG per tablet Take 1 tablet by mouth 4 (four) times daily as needed for diarrhea or loose stools. (Patient not taking: Reported on 01/15/2015) 30 tablet 0  . nitroGLYCERIN (NITROSTAT) 0.4 MG SL tablet Place 1 tablet (0.4 mg total) under the tongue every 5 (five) minutes as needed for chest pain. (Patient not taking: Reported on 01/15/2015) 25 tablet 3  . polyethylene glycol (MIRALAX / GLYCOLAX) packet Take 17 g by mouth daily as needed. (Patient not taking: Reported on 01/15/2015) 14 each 0  . tobramycin (TOBREX) 0.3 % ophthalmic solution Place 1 drop into both eyes daily as needed (for Bacterial eye infection).      No current facility-administered medications for this visit.   Facility-Administered Medications Ordered in Other Visits  Medication Dose Route Frequency Provider Last Rate Last Dose  . cyclophosphamide (CYTOXAN) 620 mg in sodium chloride 0.9 % 250 mL chemo infusion  300 mg/m2 (Treatment Plan Actual) Intravenous Once [name], MD 562 mL/hr at 01/15/15 1213 620 mg at 01/15/15 1213    Objective: Older white man who climbs on the examining table with some difficulty Filed Vitals:   01/15/15 1046  BP: 126/50  Pulse: 64  Temp: 98 F (36.7 C)  Resp: 18        Body mass index is 27.41 kg/(m^2).    ECOG FS: 2 Filed Weights   01/15/15 1046  Weight: 191 lb (86.637 kg)   Skin: warm, dry  HEENT: sclerae anicteric, conjunctivae pink, oropharynx clear. No thrush or mucositis.  Lymph Nodes: No cervical or supraclavicular lymphadenopathy  Lungs: clear to auscultation bilaterally, no rales, wheezes, or rhonci  Heart: regular rate and rhythm  Abdomen: round, soft, non tender, positive bowel sounds  Musculoskeletal: mild back tenderness, no peripheral edema, using walker to ambulate Neuro: non focal, well oriented, positive affect    LAB RESULTS:  Results for MYAN, SUIT (MRN [id number]) as of  01/03/2015 10:03  Ref. Range 09/25/2014 09:48 10/09/2014 09:50 10/23/2014 13:08 12/01/2014 11:19 12/29/2014 10:35  M-SPIKE, % Latest Units: g/dL  0.45  NOT DET NOT DET  Results for Lenz, [employer]  D (MRN [id number]) as of 01/03/2015 10:03  Ref. Range 09/25/2014 09:48 10/09/2014 09:50 10/23/2014 13:08 12/01/2014 11:19 12/29/2014 10:35  Kappa:Lambda Ratio Latest Range: 0.26-1.65  49.63 (H)  26.51 (H) 31.36 (H)   Results for TRACIE, LINDBLOOM (MRN [id number]) as of 01/03/2015 10:03  Ref. Range 11/06/2014 10:14 11/19/2014 10:18 12/01/2014 11:18 12/18/2014 10:33 12/29/2014 10:34  Retic Ct Abs Latest Range: 34.80-93.90 10e3/uL 69.54  84.46  81.18  Results for AUDY, DAUPHINE (MRN [id number]) as of 01/03/2015 10:03  Ref. Range 09/25/2014 09:48 10/23/2014 13:08 11/06/2014 10:35 12/01/2014 11:19 12/29/2014 10:35  Sodium Latest Range: 136-145 mEq/L 136 136 137 133 (L) 139     Lab Results  Component Value Date   WBC 4.4 01/15/2015   NEUTROABS 2.8 01/15/2015   HGB 9.5* 01/15/2015   HCT 29.8* 01/15/2015   MCV 87.4 01/15/2015   PLT 142 01/15/2015      Chemistry      Component Value Date/Time   NA 139 12/29/2014 1035   NA 126* 08/11/2014 1052   K 4.1 12/29/2014 1035   K 4.0 08/11/2014 1052   CL 96 08/11/2014 1052   CL 98 05/12/2013 1048   CO2 21* 12/29/2014 1035   CO2 24 08/11/2014 1052   BUN 37.2* 12/29/2014 1035   BUN 12 08/11/2014 1052   CREATININE 1.4* 12/29/2014 1035   CREATININE 1.0 08/11/2014 1052      Component Value Date/Time   CALCIUM 8.8 12/29/2014 1035   CALCIUM 9.0 08/11/2014 1052   ALKPHOS 48 12/29/2014 1035   ALKPHOS 42 03/25/2014 2115   AST 25 12/29/2014 1035   AST 25 03/25/2014 2115   ALT 22 12/29/2014 1035   ALT 10 03/25/2014 2115   BILITOT 0.58 12/29/2014 1035   BILITOT 0.7 03/25/2014 2115      STUDIES: Nuclear medicine cardiology study 11/24/2014: High risk stress nuclear study with moderate intensity, large size fixed inferior and apical scar. No reversible ischemia. LV Ejection  Fraction: 27%. LV Wall Motion: Global hypokinesis with inferior akinesis/dyskinesis   No results found.  ASSESSMENT: 81 y.o.  with kappa light chain multiple myeloma   (1) presenting April 2007 with anemia and renal failure; renal Bx showing free kappa light chain deposition; bone marrow biopsy showing 37% plasma cells, with normal cytogenetics and FISH;bone survey showing skull lytic lesions; treated with   (2) thalidomide (200 mg/d) and dexamethasone (40 mg/d x4d Q28d) June through Nov 2007, with good response, but poor tolerance;   (3) thalidomide decreased to 100 mg/d, dexamethasone continued, bortezomib (IV) added, Dec 2007 to March 2008   (4) treatment interrupted by multiple complications (peripheral neuropathy, pulmonary embolism, diverticular abscess, CN V zoster, severe DDD, congestive heart failure, rising PSA); maintenance zolendronic acid through March 2012   (5) progression April 2012, treated initially with dexamethasone alone, poorly tolerated;   (6) bortezomib (sq) resumed July 2012; cyclophosphamide and dexamethasone added Sept 2012; zolendronic acid changed to Q 59months; all treatments held as of March 2013 due to the development of the Left foot ulcer described above  (7) anemia, on aranesp December 2012 to August 2013; resumed Q14d starting May 2015, with slowly rising reticulocyte count  (8) status post Right trochanteric nail with proximal and distal interlock DePuy 11 mm one-pin lag screw, 44 mm distal interlock 08/09/2013 for a right comminuted intertrochanteric fracture  (9) status post removal of the first 2 toes left foot  (10) Left common femoral artery endarterectomy with vein patch angioplasty using left greater saphenous vein 03/24/2014  (  11) status post fall with injury to the right hip, large hematoma, requiring rehabilitation at [facility] until 07/30/2014  (12) on 10/09/2014 starting cyclophosphamide at 300 mg/m every 14 days and dexamethasone  20 mg daily 2 days each week  (13) SIADH: On fluid restriction, reduced to 1.5L on 12/04/14 because of drop in sodium  (14) chronic systolic congestive heart failure, with EF  35% per echo, with history CAD s/p CABG 2002  (15) CKD stage III  PLAN:   Timothy [name] continues to tolerate treatments well and appears to have a positive response according to his labs. At his last visit a drop in his EGFR was noted, but as discussed in the previous note, aggressive hydration is not an option due to his CHF and SIADH. At this time we will continue the fluid restriction and diuretics. Light chains were drawn this visit but were not yet available to review. He will proceed with cycle 8 of cyclophosphamide as planned toay, and will continue dexamethasone twice weekly. His hgb is down to 9.5 and will need aranasep today.   Timothy [name] will return in 2 weeks for more labs and cycle 9 of treatment. He understands and agrees with this plan. He has been encouraged to call with any issues that might arise before his next visit here.  Marcelino Duster    01/15/2015

## 2015-01-15 NOTE — Patient Instructions (Signed)
[facility] Discharge Instructions for Patients Receiving Chemotherapy  Today you received the following chemotherapy agents:  Cytoxan  To help prevent nausea and vomiting after your treatment, we encourage you to take your nausea medication as ordered per MD.   If you develop nausea and vomiting that is not controlled by your nausea medication, call the clinic.   BELOW ARE SYMPTOMS THAT SHOULD BE REPORTED IMMEDIATELY:  *FEVER GREATER THAN 100.5 F  *CHILLS WITH OR WITHOUT FEVER  NAUSEA AND VOMITING THAT IS NOT CONTROLLED WITH YOUR NAUSEA MEDICATION  *UNUSUAL SHORTNESS OF BREATH  *UNUSUAL BRUISING OR BLEEDING  TENDERNESS IN MOUTH AND THROAT WITH OR WITHOUT PRESENCE OF ULCERS  *URINARY PROBLEMS  *BOWEL PROBLEMS  UNUSUAL RASH Items with * indicate a potential emergency and should be followed up as soon as possible.  Feel free to call the clinic you have any questions or concerns. The clinic phone number is [phone].

## 2015-01-18 LAB — KAPPA/LAMBDA LIGHT CHAINS
Kappa free light chain: 57.9 mg/dL — ABNORMAL HIGH (ref 0.33–1.94)
Kappa:Lambda Ratio: 36.65 — ABNORMAL HIGH (ref 0.26–1.65)
Lambda Free Lght Chn: 1.58 mg/dL (ref 0.57–2.63)

## 2015-01-27 ENCOUNTER — Ambulatory Visit (INDEPENDENT_AMBULATORY_CARE_PROVIDER_SITE_OTHER): Payer: Medicare Other | Admitting: Podiatrist

## 2015-01-27 ENCOUNTER — Encounter: Payer: Self-pay | Admitting: Podiatrist

## 2015-01-27 VITALS — BP 120/64 | HR 69 | Resp 12

## 2015-01-27 DIAGNOSIS — L97529 Non-pressure chronic ulcer of other part of left foot with unspecified severity: Secondary | ICD-10-CM

## 2015-01-27 DIAGNOSIS — L89891 Pressure ulcer of other site, stage 1: Secondary | ICD-10-CM

## 2015-01-27 DIAGNOSIS — I255 Ischemic cardiomyopathy: Secondary | ICD-10-CM | POA: Diagnosis not present

## 2015-01-27 MED ORDER — CEPHALEXIN 500 MG PO CAPS: 500.0000 mg | ORAL_CAPSULE | Freq: Three times a day (TID) | ORAL | Status: DC

## 2015-01-27 NOTE — Progress Notes (Signed)
   Subjective:    Patient ID: Timothy [name], male    DOB: [birth year], 81 y.o.   MRN: [id number]  HPI RT FOOT GREAT TOENAIL IS BEEN LOOSE FOR 1 MONTH. THE TOENAIL IS GETTING WORSE AND BARELY HANGING ON TOP OF THE MEAT OF THE NAIL. TRIED NEOSPORIN AND BANDAGE.   Review of Systems  All other systems reviewed and are negative.      Objective:   Physical Exam   Objective: Neurovascular status unchanged with decreased pulses noted.. Right hallux nail is very loose and hanging on by the posterior medial nail border. No redness, no drainage is noted. Ulcer is still present distal to the left third toe. No probing to bone is noted. Fiber granular base is present. No malodor.Hyperkeratotic tissue is surrounding. Measurements today carried out measured 2 millimeters in diameter 2 mm in depth. Discomfort on the plantar lateral aspect of the left foot is also palpated submetatarsal 5 with a slight hyperkeratotic lesion overlying.     Assessment & Plan:  Loose hallux nail right first, ulceration left third toe, hyperkeratotic lesion submetatarsal 5 left  Plan: Recommended removal of the right hallux nail and this was accomplished today without anesthesia and without complication. In about a ointment and a dressing was then applied and he was given instructions for aftercare. The left third toe ulceration was very lightly debrided and antibiotic ointment and a dressing was applied. He will continue dressing this daily. Hyperkeratotic lesion was also debrided some metatarsal 5 left foot. I will see him back for routine recheck and he will call if any concerns arise.

## 2015-01-29 ENCOUNTER — Other Ambulatory Visit (HOSPITAL_BASED_OUTPATIENT_CLINIC_OR_DEPARTMENT_OTHER): Payer: Medicare Other

## 2015-01-29 ENCOUNTER — Ambulatory Visit (HOSPITAL_BASED_OUTPATIENT_CLINIC_OR_DEPARTMENT_OTHER): Payer: Medicare Other

## 2015-01-29 DIAGNOSIS — I739 Peripheral vascular disease, unspecified: Secondary | ICD-10-CM

## 2015-01-29 DIAGNOSIS — D631 Anemia in chronic kidney disease: Secondary | ICD-10-CM

## 2015-01-29 DIAGNOSIS — I70209 Unspecified atherosclerosis of native arteries of extremities, unspecified extremity: Secondary | ICD-10-CM

## 2015-01-29 DIAGNOSIS — D630 Anemia in neoplastic disease: Secondary | ICD-10-CM

## 2015-01-29 DIAGNOSIS — N183 Chronic kidney disease, stage 3 (moderate): Secondary | ICD-10-CM

## 2015-01-29 DIAGNOSIS — C9000 Multiple myeloma not having achieved remission: Secondary | ICD-10-CM

## 2015-01-29 DIAGNOSIS — Z5111 Encounter for antineoplastic chemotherapy: Secondary | ICD-10-CM | POA: Diagnosis not present

## 2015-01-29 DIAGNOSIS — M869 Osteomyelitis, unspecified: Secondary | ICD-10-CM

## 2015-01-29 DIAGNOSIS — L98499 Non-pressure chronic ulcer of skin of other sites with unspecified severity: Principal | ICD-10-CM

## 2015-01-29 LAB — COMPREHENSIVE METABOLIC PANEL (CC13)
ALK PHOS: 46 U/L (ref 40–150)
ALT: 29 U/L (ref 0–55)
AST: 27 U/L (ref 5–34)
Albumin: 2.8 g/dL — ABNORMAL LOW (ref 3.5–5.0)
Anion Gap: 10 mEq/L (ref 3–11)
BUN: 37.6 mg/dL — AB (ref 7.0–26.0)
CO2: 22 mEq/L (ref 22–29)
Calcium: 9.4 mg/dL (ref 8.4–10.4)
Chloride: 108 mEq/L (ref 98–109)
Creatinine: 1.4 mg/dL — ABNORMAL HIGH (ref 0.7–1.3)
EGFR: 47 mL/min/{1.73_m2} — ABNORMAL LOW (ref >90)
GLUCOSE: 110 mg/dL (ref 70–140)
Potassium: 4.2 mEq/L (ref 3.5–5.1)
Sodium: 140 mEq/L (ref 136–145)
TOTAL PROTEIN: 5.7 g/dL — AB (ref 6.4–8.3)
Total Bilirubin: 0.45 mg/dL (ref 0.20–1.20)

## 2015-01-29 LAB — CBC & DIFF AND RETIC
BASO%: 2.1 % — ABNORMAL HIGH (ref 0.0–2.0)
BASOS ABS: 0.1 10*3/uL (ref 0.0–0.1)
EOS ABS: 0.1 10*3/uL (ref 0.0–0.5)
EOS%: 1.5 % (ref 0.0–7.0)
HCT: 29.7 % — ABNORMAL LOW (ref 38.4–49.9)
HEMOGLOBIN: 9.3 g/dL — AB (ref 13.0–17.1)
Immature Retic Fract: 10 % (ref 3.00–10.60)
LYMPH%: 14.5 % (ref 14.0–49.0)
MCH: 27.8 pg (ref 27.2–33.4)
MCHC: 31.3 g/dL — ABNORMAL LOW (ref 32.0–36.0)
MCV: 88.9 fL (ref 79.3–98.0)
MONO#: 0.9 10*3/uL (ref 0.1–0.9)
MONO%: 19.8 % — ABNORMAL HIGH (ref 0.0–14.0)
NEUT%: 62.1 % (ref 39.0–75.0)
NEUTROS ABS: 2.9 10*3/uL (ref 1.5–6.5)
PLATELETS: 171 10*3/uL (ref 140–400)
RBC: 3.34 10*6/uL — ABNORMAL LOW (ref 4.20–5.82)
RDW: 21.1 % — AB (ref 11.0–14.6)
RETIC CT ABS: 79.16 10*3/uL (ref 34.80–93.90)
Retic %: 2.37 % — ABNORMAL HIGH (ref 0.80–1.80)
WBC: 4.7 10*3/uL (ref 4.0–10.3)
lymph#: 0.7 10*3/uL — ABNORMAL LOW (ref 0.9–3.3)

## 2015-01-29 LAB — TECHNOLOGIST REVIEW

## 2015-01-29 LAB — FERRITIN CHCC: Ferritin: 358 ng/ml — ABNORMAL HIGH (ref 22–316)

## 2015-01-29 MED ORDER — SODIUM CHLORIDE 0.9 % IV SOLN
300.0000 mg/m2 | Freq: Once | INTRAVENOUS | Status: AC
  Administered 2015-01-29: 620 mg via INTRAVENOUS
  Filled 2015-01-29: qty 31

## 2015-01-29 MED ORDER — SODIUM CHLORIDE 0.9 % IV SOLN
Freq: Once | INTRAVENOUS | Status: AC
  Administered 2015-01-29: 12:00:00 via INTRAVENOUS
  Filled 2015-01-29: qty 4

## 2015-01-29 MED ORDER — DARBEPOETIN ALFA 300 MCG/0.6ML IJ SOSY
300.0000 ug | PREFILLED_SYRINGE | Freq: Once | INTRAMUSCULAR | Status: AC
  Administered 2015-01-29: 300 ug via SUBCUTANEOUS
  Filled 2015-01-29: qty 0.6

## 2015-01-29 MED ORDER — SODIUM CHLORIDE 0.9 % IV SOLN
Freq: Once | INTRAVENOUS | Status: AC
  Administered 2015-01-29: 13:00:00 via INTRAVENOUS

## 2015-01-29 NOTE — Patient Instructions (Signed)
[facility] Discharge Instructions for Patients Receiving Chemotherapy  Today you received the following chemotherapy agents Cytoxan.  To help prevent nausea and vomiting after your treatment, we encourage you to take your nausea medication as prescribed.   If you develop nausea and vomiting that is not controlled by your nausea medication, call the clinic.   BELOW ARE SYMPTOMS THAT SHOULD BE REPORTED IMMEDIATELY:  *FEVER GREATER THAN 100.5 F  *CHILLS WITH OR WITHOUT FEVER  NAUSEA AND VOMITING THAT IS NOT CONTROLLED WITH YOUR NAUSEA MEDICATION  *UNUSUAL SHORTNESS OF BREATH  *UNUSUAL BRUISING OR BLEEDING  TENDERNESS IN MOUTH AND THROAT WITH OR WITHOUT PRESENCE OF ULCERS  *URINARY PROBLEMS  *BOWEL PROBLEMS  UNUSUAL RASH Items with * indicate a potential emergency and should be followed up as soon as possible.  Feel free to call the clinic you have any questions or concerns. The clinic phone number is [phone].    

## 2015-02-01 LAB — KAPPA/LAMBDA LIGHT CHAINS
Kappa free light chain: 51.4 mg/dL — ABNORMAL HIGH (ref 0.33–1.94)
Kappa:Lambda Ratio: 33.38 — ABNORMAL HIGH (ref 0.26–1.65)
Lambda Free Lght Chn: 1.54 mg/dL (ref 0.57–2.63)

## 2015-02-10 ENCOUNTER — Other Ambulatory Visit: Payer: Self-pay | Admitting: Oncology

## 2015-02-10 ENCOUNTER — Telehealth: Payer: Self-pay | Admitting: *Deleted

## 2015-02-10 NOTE — Telephone Encounter (Signed)
Patient's wife called to says she is concerned about her husband's hemoglobin dropping over the last month.  He is coming in tomorrow for a CBC and wonders whether he should receive Aranesp tomorrow.  Advised her to wait in the lobby for results.  She can speak with a Triage nurse or Dr. [name] nurse if his hemoglobin is decreased further.

## 2015-02-11 ENCOUNTER — Telehealth: Payer: Self-pay | Admitting: Oncology

## 2015-02-11 ENCOUNTER — Other Ambulatory Visit: Payer: Self-pay | Admitting: *Deleted

## 2015-02-11 NOTE — Telephone Encounter (Signed)
Called and left a message with new appointment time for 4/1

## 2015-02-12 ENCOUNTER — Other Ambulatory Visit (HOSPITAL_BASED_OUTPATIENT_CLINIC_OR_DEPARTMENT_OTHER): Payer: Medicare Other

## 2015-02-12 ENCOUNTER — Telehealth: Payer: Self-pay | Admitting: *Deleted

## 2015-02-12 DIAGNOSIS — C9000 Multiple myeloma not having achieved remission: Secondary | ICD-10-CM | POA: Diagnosis not present

## 2015-02-12 DIAGNOSIS — M869 Osteomyelitis, unspecified: Secondary | ICD-10-CM

## 2015-02-12 DIAGNOSIS — I739 Peripheral vascular disease, unspecified: Secondary | ICD-10-CM

## 2015-02-12 DIAGNOSIS — I70209 Unspecified atherosclerosis of native arteries of extremities, unspecified extremity: Secondary | ICD-10-CM

## 2015-02-12 DIAGNOSIS — N289 Disorder of kidney and ureter, unspecified: Secondary | ICD-10-CM

## 2015-02-12 DIAGNOSIS — L98499 Non-pressure chronic ulcer of skin of other sites with unspecified severity: Principal | ICD-10-CM

## 2015-02-12 LAB — COMPREHENSIVE METABOLIC PANEL (CC13)
ALK PHOS: 48 U/L (ref 40–150)
ALT: 26 U/L (ref 0–55)
AST: 26 U/L (ref 5–34)
Albumin: 3 g/dL — ABNORMAL LOW (ref 3.5–5.0)
Anion Gap: 9 mEq/L (ref 3–11)
BUN: 33.1 mg/dL — AB (ref 7.0–26.0)
CALCIUM: 8.8 mg/dL (ref 8.4–10.4)
CO2: 23 mEq/L (ref 22–29)
Chloride: 107 mEq/L (ref 98–109)
Creatinine: 1.3 mg/dL (ref 0.7–1.3)
EGFR: 50 mL/min/{1.73_m2} — AB (ref >90)
GLUCOSE: 109 mg/dL (ref 70–140)
Potassium: 3.7 mEq/L (ref 3.5–5.1)
Sodium: 138 mEq/L (ref 136–145)
Total Bilirubin: 0.53 mg/dL (ref 0.20–1.20)
Total Protein: 5.6 g/dL — ABNORMAL LOW (ref 6.4–8.3)

## 2015-02-12 LAB — CBC WITH DIFFERENTIAL/PLATELET
BASO%: 1.4 % (ref 0.0–2.0)
BASOS ABS: 0.1 10*3/uL (ref 0.0–0.1)
EOS%: 1.1 % (ref 0.0–7.0)
Eosinophils Absolute: 0 10*3/uL (ref 0.0–0.5)
HEMATOCRIT: 29.5 % — AB (ref 38.4–49.9)
HEMOGLOBIN: 9.4 g/dL — AB (ref 13.0–17.1)
LYMPH%: 19.4 % (ref 14.0–49.0)
MCH: 28.3 pg (ref 27.2–33.4)
MCHC: 32 g/dL (ref 32.0–36.0)
MCV: 88.4 fL (ref 79.3–98.0)
MONO#: 0.9 10*3/uL (ref 0.1–0.9)
MONO%: 20.8 % — ABNORMAL HIGH (ref 0.0–14.0)
NEUT#: 2.5 10*3/uL (ref 1.5–6.5)
NEUT%: 57.3 % (ref 39.0–75.0)
Platelets: 161 10*3/uL (ref 140–400)
RBC: 3.34 10*6/uL — ABNORMAL LOW (ref 4.20–5.82)
RDW: 24.3 % — ABNORMAL HIGH (ref 11.0–14.6)
WBC: 4.3 10*3/uL (ref 4.0–10.3)
lymph#: 0.8 10*3/uL — ABNORMAL LOW (ref 0.9–3.3)

## 2015-02-12 LAB — TECHNOLOGIST REVIEW

## 2015-02-12 LAB — FERRITIN CHCC: FERRITIN: 290 ng/mL (ref 22–316)

## 2015-02-12 NOTE — Telephone Encounter (Signed)
Returned voicemail to patient confirming appt time for 02/19/15 at 4pm. Lab appt cancelled for that day as patient had all labs drawn today.

## 2015-02-16 LAB — KAPPA/LAMBDA LIGHT CHAINS
KAPPA FREE LGHT CHN: 49.1 mg/dL — AB (ref 0.33–1.94)
KAPPA LAMBDA RATIO: 30.5 — AB (ref 0.26–1.65)
Lambda Free Lght Chn: 1.61 mg/dL (ref 0.57–2.63)

## 2015-02-16 LAB — PROTEIN ELECTROPHORESIS, SERUM
ALBUMIN ELP: 3.1 g/dL — AB (ref 3.8–4.8)
Alpha-1-Globulin: 0.4 g/dL — ABNORMAL HIGH (ref 0.2–0.3)
Alpha-2-Globulin: 0.8 g/dL (ref 0.5–0.9)
BETA 2: 0.3 g/dL (ref 0.2–0.5)
BETA GLOBULIN: 0.4 g/dL (ref 0.4–0.6)
GAMMA GLOBULIN: 0.6 g/dL — AB (ref 0.8–1.7)
Total Protein, Serum Electrophoresis: 5.6 g/dL — ABNORMAL LOW (ref 6.1–8.1)

## 2015-02-19 ENCOUNTER — Other Ambulatory Visit: Payer: Medicare Other

## 2015-02-19 ENCOUNTER — Ambulatory Visit (HOSPITAL_BASED_OUTPATIENT_CLINIC_OR_DEPARTMENT_OTHER): Payer: Medicare Other | Admitting: Oncology

## 2015-02-19 VITALS — BP 117/42 | HR 63 | Temp 97.4°F | Resp 18 | Ht 70.0 in | Wt 186.7 lb

## 2015-02-19 DIAGNOSIS — C9000 Multiple myeloma not having achieved remission: Secondary | ICD-10-CM | POA: Diagnosis not present

## 2015-02-19 DIAGNOSIS — L98499 Non-pressure chronic ulcer of skin of other sites with unspecified severity: Secondary | ICD-10-CM

## 2015-02-19 DIAGNOSIS — G620 Drug-induced polyneuropathy: Secondary | ICD-10-CM

## 2015-02-19 DIAGNOSIS — N183 Chronic kidney disease, stage 3 (moderate): Secondary | ICD-10-CM

## 2015-02-19 DIAGNOSIS — I70209 Unspecified atherosclerosis of native arteries of extremities, unspecified extremity: Secondary | ICD-10-CM

## 2015-02-19 DIAGNOSIS — I5023 Acute on chronic systolic (congestive) heart failure: Secondary | ICD-10-CM

## 2015-02-19 DIAGNOSIS — D630 Anemia in neoplastic disease: Secondary | ICD-10-CM | POA: Diagnosis not present

## 2015-02-19 MED ORDER — GABAPENTIN 300 MG PO CAPS: 300.0000 mg | ORAL_CAPSULE | Freq: Every day | ORAL | Status: DC

## 2015-02-19 MED ORDER — ACYCLOVIR 400 MG PO TABS: 400.0000 mg | ORAL_TABLET | Freq: Two times a day (BID) | ORAL | Status: DC

## 2015-02-19 MED ORDER — HYDROMORPHONE HCL 4 MG PO TABS: 4.0000 mg | ORAL_TABLET | ORAL | Status: DC | PRN

## 2015-02-20 ENCOUNTER — Encounter: Payer: Self-pay | Admitting: Oncology

## 2015-02-20 NOTE — Progress Notes (Signed)
ID: Timothy [name]   DOB: 1928-09-24  MR#: [id number]  TWS#:[id number]  PCP:  SU: OTHER MD: Dorna Leitz, Alysia Penna, Georgia Duff, Geryl Councilman  CHIEF COMPLAINT: multiple myeloma CURRENT THERAPY: cyclophosphamide, dexamethasone, darbepoetin   MYELOMA HISTORY: From the earlier summary:  Patient presented in April 2007 with symptomatic slowly progressive anemia, with normal iron parameters, folic acid, and vitamin B12. Plans were made to obtain bone marrow aspirate and biopsy as an outpatient, however the patient was hospitalized shortly thereafter with a creatinine of 8.6. Renal biopsy demonstrated the presence of  light chain deposition disease. Bone marrow biopsy demonstrated 37% plasma cells. A skeletal survey showed small calvarial lytic lesions, mild osteopenia, and cervical spondylosis. With a well-established diagnosis of light-chain multiple myeloma Dr. Melodie Bouillon subsequent treatments are as detailed below.   INTERVAL HISTORY: Timothy [name] returns today with his wife, [name], for follow up of his multiple myeloma. Today is day 22 cycle 9 of his every two-week Cytoxan. His treatment on 02/12/2015, cycle 10, was held because the patient was having more neuropathy symptoms and he felt possibly the neuropathy was due to the cyclophosphamide. Also his wife [name] was concerned that his anemia was not getting better. We obtain restaging labs 02/12/2015 and he is here to discuss those results  REVIEW OF SYSTEMS: Timothy [name] is "still taking". He mows the yard and he does some "free walking" meaning without a walker, at home. However he gets very short of breath even with that minimal activity. He understands the problem is not the myeloma or even the anemia, but the heart issues. The neuropathies pain has worsened, he tells me, and extends from the middle of the lower leg through the feet and also involves the hands. It is worse at  night. He tells me he has tried gabapentin and Lyrica in the past but it makes him dizzy during the day and is afraid of falls. He also has back pain. They have been no intercurrent fevers or rash. He is using the Dilaudid for pain control without any significant increase in use and he is not constipated secondary to the narcotics. A detailed review of systems today was otherwise stable.  PAST MEDICAL HISTORY: Past Medical History  Diagnosis Date  . ASCVD (arteriosclerotic cardiovascular disease)   . Anemia     chronic mild anemia  . Gout   . Hypertension   . Lumbar disc disease     post laminectomy  . Chronic back pain   . History of echocardiogram 11/02/2010    EF range of 30 to 35% / There is hypokinsesis of the basal-mild inferolateral myocardium  . CHF (congestive heart failure)   . Heart murmur   . Ischemic cardiomyopathy   . Renal insufficiency   . Peripheral neuropathy   . Inferior myocardial infarction 1986  . Hyperlipidemia   . SOB (shortness of breath)   . Fatigue   . Peripheral neuropathy   . Coronary artery disease     CABG 2002 by Dr. Roxy Manns with LIMA to LAD, SVG to intermediate, SVG to LCX, & SVG to PL. Last nuclear in 2012 showing large inferior scar with EF of 39%.   . Blood transfusion     DEC 2012 - TWO UNITS  . Myeloma     Multiple myeloma, with recurrence of increasing problems  DR. Freedom -ONCOLOGIST.  PT HAS BEEN OFF CHEMO SINCE HIS HOSPITALIZATION FEB 2013 FOR LEFT FOOT INFECTION  . Cancer   .  History of shingles MARCH 2009    SHINGLE LESIONS WERE AROUND RIGHT EYE--PT HAS RESIDUAL ITCHING AROUND THE EYE.  . Osteomyelitis     s/p toe amputation  . Chronic systolic heart failure     EF of 30% per echo 02/2012  . Protein malnutrition   . Critical lower limb ischemia   . Peripheral vascular disease   . Hx of cardiovascular stress test     Lexiscan Myoview (1/16):  Large inferior and apical scar; no ischemia, EF 27%;  High Risk   PAST SURGICAL  HISTORY: Past Surgical History  Procedure Laterality Date  . Laminectomy    . Inguinal hernia repair    . Cardiac catheterization  06/20/2000    Severe coronary disease (totally occluded right artery, 90% left circumflex, 50-60% intermediate, and 90-95% ostial left anterior descending)   . Tonsillectomy    . Coronary artery bypass graft  2002    x4 / Left internal mammary artery to the LAD.  / Saphenous vein graft to the right posterolateral.  /  Saphenous vein graft to  the ramus intermedius. / Saphenous vein graft to the circumflex marginal     . Amputation  03/06/2012    Procedure: AMPUTATION RAY;  Surgeon: Alta Corning, MD;  Location: [facility];  Service: Orthopedics;  Laterality: Left;  First Ray Amputation  . Intramedullary (im) nail intertrochanteric Right 08/17/2013    Procedure: INTRAMEDULLARY (IM) NAIL INTERTROCHANTRIC;  Surgeon: Marybelle Killings, MD;  Location: [facility];  Service: Orthopedics;  Laterality: Right;  . Endarterectomy femoral Left 03/24/2014    Procedure: Left Common Femoral  Artery Endarterectomy with Vein Patch Angioplasty,  Ultrasound ;  Surgeon: Angelia Mould, MD;  Location: [place];  Service: Vascular;  Laterality: Left;  . Toe amputation    . Lower extremity angiogram N/A 03/23/2014    Procedure: LOWER EXTREMITY ANGIOGRAM;  Surgeon: Lorretta Harp, MD;  Location: [facility] CATH [facility];  Service: Cardiovascular;  Laterality: N/A;    FAMILY HISTORY No family history of hematologic malignancies; brother had prostate cancer; no other cancers in the immediate family  SOCIAL HISTORY: Retired Animal nutritionist; children from prior marriage. He and his wife [name] are the only ones at home.    ADVANCED DIRECTIVES: [name] has wife is his healthcare part of attorney  HEALTH MAINTENANCE: History  Substance Use Topics  . Smoking status: Former Smoker -- 1.00 packs/day for 20 years    Types: Cigarettes    Quit date: 11/20/1984  . Smokeless tobacco: Never Used  . Alcohol Use: No      Colonoscopy:  Bone density:  Lipid panel:  Allergies  Allergen Reactions  . Morphine And Related Other (See Comments)    Agitated with morphine drip    Current Outpatient Prescriptions  Medication Sig Dispense Refill  . acyclovir (ZOVIRAX) 400 MG tablet Take 1 tablet (400 mg total) by mouth 2 (two) times daily. 60 tablet 3  . allopurinol (ZYLOPRIM) 300 MG tablet Take 0.5 tablets (150 mg total) by mouth daily. 90 tablet 3  . amLODipine (NORVASC) 2.5 MG tablet Take 1 tablet (2.5 mg total) by mouth daily. 90 tablet 3  . aspirin EC 81 MG tablet Take 81 mg by mouth 3 (three) times a week. Monday, Wednesday, Friday    . atorvastatin (LIPITOR) 10 MG tablet Take 1 tablet (10 mg total) by mouth every evening. 90 tablet 3  . B Complex-Biotin-FA (B COMPLETE PO) Take 1 tablet by mouth daily.     Marland Kitchen  carvedilol (COREG) 6.25 MG tablet Take 1 tablet (6.25 mg total) by mouth 2 (two) times daily with a meal. 180 tablet 3  . Coenzyme Q10 (CO Q-10 PO) Take 1 tablet by mouth every morning.     . darbepoetin (ARANESP) 200 MCG/0.4ML SOLN injection Inject 200 mcg into the skin as directed.    Marland Kitchen dexamethasone (DECADRON) 4 MG tablet Take five tablets 2 consecutive days every week 40 tablet 6  . diphenoxylate-atropine (LOMOTIL) 2.5-0.025 MG per tablet Take 1 tablet by mouth 4 (four) times daily as needed for diarrhea or loose stools. 30 tablet 0  . furosemide (LASIX) 40 MG tablet Take 1 tablet (40 mg total) by mouth daily. 90 tablet 1  . gabapentin (NEURONTIN) 300 MG capsule Take 1 capsule (300 mg total) by mouth at bedtime. 90 capsule 4  . HYDROmorphone (DILAUDID) 4 MG tablet Take 1 tablet (4 mg total) by mouth every 4 (four) hours as needed for severe pain. 30 tablet 0  . isosorbide mononitrate (IMDUR) 30 MG 24 hr tablet Take 1 tablet (30 mg total) by mouth daily. 90 tablet 3  . lisinopril (PRINIVIL,ZESTRIL) 20 MG tablet Take 20 mg by mouth daily.    . Multiple Vitamin (MULTIVITAMIN) capsule Take 1 capsule  by mouth every morning.     . nitroGLYCERIN (NITROSTAT) 0.4 MG SL tablet Place 1 tablet (0.4 mg total) under the tongue every 5 (five) minutes as needed for chest pain. 25 tablet 3  . ondansetron (ZOFRAN) 8 MG tablet Take 1 tablet (8 mg total) by mouth 2 (two) times daily as needed (Nausea or vomiting). 30 tablet 1  . polyethylene glycol (MIRALAX / GLYCOLAX) packet Take 17 g by mouth daily as needed. 14 each 0  . rOPINIRole (REQUIP) 3 MG tablet Take 1 tablet (3 mg total) by mouth at bedtime. 90 tablet 4  . silver sulfADIAZINE (SILVADENE) 1 % cream Apply 1 application topically daily. 50 g 0  . tamsulosin (FLOMAX) 0.4 MG CAPS capsule Take 0.4 mg by mouth at bedtime.    . vitamin C (ASCORBIC ACID) 500 MG tablet Take 500 mg by mouth every morning.     No current facility-administered medications for this visit.    Objective: Older white man using a walker Filed Vitals:   02/19/15 1557  BP: 117/42  Pulse: 63  Temp: 97.4 F (36.3 C)  Resp: 18        Body mass index is 26.79 kg/(m^2).    ECOG FS: 2 Filed Weights   02/19/15 1557  Weight: 186 lb 11.2 oz (84.687 kg)   Sclerae unicteric, pupils round and equal Oropharynx clear and moist No cervical or supraclavicular adenopathy Lungs no rales or rhonchi Heart regular rate and rhythm Abd soft, nontender, positive bowel sounds MSK no focal spinal tenderness, no joint edema Neuro: nonfocal, well oriented, positive affect   LAB RESULTS:  Results for DRAYLEN, LOBUE (MRN [id number]) as of 02/20/2015 12:03  Ref. Range 10/23/2014 13:08 12/01/2014 11:19 01/15/2015 10:14 01/29/2015 10:48 02/12/2015 14:37  Kappa free light chain Latest Range: 0.33-1.94 mg/dL 62.30 (H) 74.00 (H) 57.90 (H) 51.40 (H) 49.10 (H)   Results for GRECO, GASTELUM (MRN [id number]) as of 02/20/2015 12:03  Ref. Range 11/06/2014 10:35 12/01/2014 11:19 12/29/2014 10:35 01/29/2015 10:48 02/12/2015 14:37  Sodium Latest Range: 136-145 mEq/L 137 133 (L) 139 140 138  Results for ATHARV, BARRIERE (MRN [id number]) as of 02/20/2015 12:03  Ref. Range 11/06/2014 10:35 12/01/2014 11:19 12/29/2014 10:35 01/29/2015 10:48 02/12/2015  14:37  Calcium Latest Range: 8.4-10.5 mg/dL 9.0 8.6 8.8 9.4 8.8   Results for VICKY, MCCANLESS (MRN [id number]) as of 02/20/2015 12:03  Ref. Range 12/18/2014 10:33 12/29/2014 10:34 01/15/2015 10:14 01/29/2015 10:47 02/12/2015 10:43  Hemoglobin Latest Range: 13.0-17.0 g/dL 10.3 (L) 9.6 (L) 9.5 (L) 9.3 (L) 9.4 (L)   Lab Results  Component Value Date   WBC 4.3 02/12/2015   NEUTROABS 2.5 02/12/2015   HGB 9.4* 02/12/2015   HCT 29.5* 02/12/2015   MCV 88.4 02/12/2015   PLT 161 02/12/2015      Chemistry      Component Value Date/Time   NA 138 02/12/2015 1437   NA 126* 08/11/2014 1052   K 3.7 02/12/2015 1437   K 4.0 08/11/2014 1052   CL 96 08/11/2014 1052   CL 98 05/12/2013 1048   CO2 23 02/12/2015 1437   CO2 24 08/11/2014 1052   BUN 33.1* 02/12/2015 1437   BUN 12 08/11/2014 1052   CREATININE 1.3 02/12/2015 1437   CREATININE 1.0 08/11/2014 1052      Component Value Date/Time   CALCIUM 8.8 02/12/2015 1437   CALCIUM 9.0 08/11/2014 1052   ALKPHOS 48 02/12/2015 1437   ALKPHOS 42 03/25/2014 2115   AST 26 02/12/2015 1437   AST 25 03/25/2014 2115   ALT 26 02/12/2015 1437   ALT 10 03/25/2014 2115   BILITOT 0.53 02/12/2015 1437   BILITOT 0.7 03/25/2014 2115      STUDIES: No results found.  ASSESSMENT: 81 y.o.  with kappa light chain multiple myeloma   (1) presenting April 2007 with anemia and renal failure; renal Bx showing free kappa light chain deposition; bone marrow biopsy showing 37% plasma cells, with normal cytogenetics and FISH;bone survey showing skull lytic lesions; treated with   (2) thalidomide (200 mg/d) and dexamethasone (40 mg/d x4d Q28d) June through Nov 2007, with good response, but poor tolerance;   (3) thalidomide decreased to 100 mg/d, dexamethasone continued, bortezomib (IV) added, Dec 2007 to March 2008   (4) treatment interrupted  by multiple complications (peripheral neuropathy, pulmonary embolism, diverticular abscess, CN V zoster, severe DDD, congestive heart failure, rising PSA); maintenance zolendronic acid through March 2012   (5) progression April 2012, treated initially with dexamethasone alone, poorly tolerated;   (6) bortezomib (sq) resumed July 2012; cyclophosphamide and dexamethasone added Sept 2012; zolendronic acid changed to Q 65months; all treatments held as of March 2013 due to the development of the Left foot ulcer described above  (7) anemia, on aranesp December 2012 to August 2013; resumed Q14d starting May 2015, with slowly rising reticulocyte count  (8) status post Right trochanteric nail with proximal and distal interlock DePuy 11 mm one-pin lag screw, 44 mm distal interlock 08/09/2013 for a right comminuted intertrochanteric fracture  (9) status post removal of the first 2 toes left foot  (10) Left common femoral artery endarterectomy with vein patch angioplasty using left greater saphenous vein 03/24/2014  (11) status post fall with injury to the right hip, large hematoma, requiring [facility] until 07/30/2014  (12) on 10/09/2014 starting cyclophosphamide at 300 mg/m every 14 days and dexamethasone 20 mg daily 2 days each week  (13) SIADH: On fluid restriction, reduced to 1.5L on 12/04/14 because of drop in sodium  (14) chronic systolic congestive heart failure, with EF  35% per echo, with history CAD s/p CABG 2002  (15) CKD stage III  PLAN:   Dick's neuropathy is getting worse or at least feeling worse. He  thought this might be due to the cyclophosphamide, but this drug generally does not cause neuropathy. He tells me he has had gabapentin and Lyrica in the past with very little relief. Nevertheless I think you should try gabapentin at bedtime, which is when he has the most symptoms. He will take that every night at 300 mg at night for the next week and let us know if  that does not work in which case we would increase it to 600 mg at bedtime. We are not going to use a gabapentin during the day because of concerns regarding falls  The Light chains are decreasing and the ratio is improving. Accordingly I would continue the cyclophosphamide every 2 weeks as we have been doing. I believe is tolerating it very well  [name] is concerned that he continues anemic. His hemoglobin has been very stable at the 9.5 range. It is possible that his face hemoglobin were a little bit above 10 he might be a little bit more functional. I am increasing his Aranesp to 500 mg every 2 weeks. We will reassess results when he returns to see me in approximately a month  Timothy [name] has a good understanding of this plan. He agrees with it. He will call me with any problems that may develop before the next visit here.  MAGRINAT,GUSTAV C    02/20/2015

## 2015-02-22 ENCOUNTER — Telehealth: Payer: Self-pay | Admitting: *Deleted

## 2015-02-22 ENCOUNTER — Telehealth: Payer: Self-pay | Admitting: Oncology

## 2015-02-22 ENCOUNTER — Other Ambulatory Visit: Payer: Medicare Other

## 2015-02-22 NOTE — Telephone Encounter (Signed)
per pof tos ch pt appt-sent MW email to sch pt trmt-will call pt after reply °

## 2015-02-22 NOTE — Telephone Encounter (Signed)
Per staff message and POF I have scheduled appts. Advised scheduler of appts. JMW  

## 2015-02-24 ENCOUNTER — Other Ambulatory Visit: Payer: Self-pay | Admitting: *Deleted

## 2015-02-24 ENCOUNTER — Telehealth: Payer: Self-pay | Admitting: *Deleted

## 2015-02-24 ENCOUNTER — Ambulatory Visit (INDEPENDENT_AMBULATORY_CARE_PROVIDER_SITE_OTHER): Payer: Medicare Other | Admitting: Podiatrist

## 2015-02-24 ENCOUNTER — Encounter: Payer: Self-pay | Admitting: Podiatrist

## 2015-02-24 VITALS — BP 158/59 | HR 65 | Resp 15

## 2015-02-24 DIAGNOSIS — L84 Corns and callosities: Secondary | ICD-10-CM

## 2015-02-24 DIAGNOSIS — I739 Peripheral vascular disease, unspecified: Secondary | ICD-10-CM | POA: Diagnosis not present

## 2015-02-24 DIAGNOSIS — R0989 Other specified symptoms and signs involving the circulatory and respiratory systems: Secondary | ICD-10-CM

## 2015-02-24 MED ORDER — HYDROMORPHONE HCL 4 MG PO TABS: 4.0000 mg | ORAL_TABLET | ORAL | Status: DC | PRN

## 2015-02-24 NOTE — Telephone Encounter (Signed)
Wife, [name] called to let Marlon Pel RN know that they will pick up narcotic pain medication script on Friday 02/26/15.

## 2015-02-26 ENCOUNTER — Other Ambulatory Visit: Payer: Medicare Other

## 2015-02-26 ENCOUNTER — Ambulatory Visit: Payer: Medicare Other | Admitting: Oncology

## 2015-02-26 ENCOUNTER — Other Ambulatory Visit (HOSPITAL_BASED_OUTPATIENT_CLINIC_OR_DEPARTMENT_OTHER): Payer: Medicare Other

## 2015-02-26 ENCOUNTER — Ambulatory Visit (HOSPITAL_BASED_OUTPATIENT_CLINIC_OR_DEPARTMENT_OTHER): Payer: Medicare Other

## 2015-02-26 DIAGNOSIS — D630 Anemia in neoplastic disease: Secondary | ICD-10-CM

## 2015-02-26 DIAGNOSIS — C9000 Multiple myeloma not having achieved remission: Secondary | ICD-10-CM

## 2015-02-26 DIAGNOSIS — Z5111 Encounter for antineoplastic chemotherapy: Secondary | ICD-10-CM | POA: Diagnosis present

## 2015-02-26 DIAGNOSIS — I739 Peripheral vascular disease, unspecified: Secondary | ICD-10-CM

## 2015-02-26 DIAGNOSIS — L98499 Non-pressure chronic ulcer of skin of other sites with unspecified severity: Principal | ICD-10-CM

## 2015-02-26 DIAGNOSIS — I70209 Unspecified atherosclerosis of native arteries of extremities, unspecified extremity: Secondary | ICD-10-CM

## 2015-02-26 DIAGNOSIS — M869 Osteomyelitis, unspecified: Secondary | ICD-10-CM

## 2015-02-26 LAB — TECHNOLOGIST REVIEW

## 2015-02-26 LAB — CBC WITH DIFFERENTIAL/PLATELET
BASO%: 0.9 % (ref 0.0–2.0)
Basophils Absolute: 0.1 10*3/uL (ref 0.0–0.1)
EOS%: 1.8 % (ref 0.0–7.0)
Eosinophils Absolute: 0.1 10*3/uL (ref 0.0–0.5)
HCT: 30.9 % — ABNORMAL LOW (ref 38.4–49.9)
HGB: 9.9 g/dL — ABNORMAL LOW (ref 13.0–17.1)
LYMPH%: 18.4 % (ref 14.0–49.0)
MCH: 28.8 pg (ref 27.2–33.4)
MCHC: 32 g/dL (ref 32.0–36.0)
MCV: 89.8 fL (ref 79.3–98.0)
MONO#: 1.2 10*3/uL — AB (ref 0.1–0.9)
MONO%: 15 % — AB (ref 0.0–14.0)
NEUT%: 63.9 % (ref 39.0–75.0)
NEUTROS ABS: 5.1 10*3/uL (ref 1.5–6.5)
PLATELETS: 133 10*3/uL — AB (ref 140–400)
RBC: 3.44 10*6/uL — AB (ref 4.20–5.82)
RDW: 22.4 % — ABNORMAL HIGH (ref 11.0–14.6)
WBC: 7.9 10*3/uL (ref 4.0–10.3)
lymph#: 1.5 10*3/uL (ref 0.9–3.3)
nRBC: 0 % (ref 0–0)

## 2015-02-26 MED ORDER — DARBEPOETIN ALFA 500 MCG/ML IJ SOSY
500.0000 ug | PREFILLED_SYRINGE | Freq: Once | INTRAMUSCULAR | Status: AC
  Administered 2015-02-26: 500 ug via SUBCUTANEOUS
  Filled 2015-02-26: qty 1

## 2015-02-26 MED ORDER — DEXAMETHASONE SODIUM PHOSPHATE 100 MG/10ML IJ SOLN
Freq: Once | INTRAMUSCULAR | Status: AC
  Administered 2015-02-26: 11:00:00 via INTRAVENOUS
  Filled 2015-02-26: qty 4

## 2015-02-26 MED ORDER — ZOLEDRONIC ACID 4 MG/100ML IV SOLN: 4.0000 mg | Freq: Once | INTRAVENOUS | Status: DC

## 2015-02-26 MED ORDER — SODIUM CHLORIDE 0.9 % IV SOLN
Freq: Once | INTRAVENOUS | Status: AC
  Administered 2015-02-26: 11:00:00 via INTRAVENOUS

## 2015-02-26 MED ORDER — ZOLEDRONIC ACID 4 MG/100ML IV SOLN: 3.0000 mg | Freq: Once | INTRAVENOUS | Status: DC

## 2015-02-26 MED ORDER — SODIUM CHLORIDE 0.9 % IV SOLN
300.0000 mg/m2 | Freq: Once | INTRAVENOUS | Status: AC
  Administered 2015-02-26: 620 mg via INTRAVENOUS
  Filled 2015-02-26: qty 31

## 2015-02-26 MED ORDER — ZOLEDRONIC ACID 4 MG/5ML IV CONC
3.0000 mg | Freq: Once | INTRAVENOUS | Status: AC
  Administered 2015-02-26: 3 mg via INTRAVENOUS
  Filled 2015-02-26: qty 3.75

## 2015-02-26 NOTE — Progress Notes (Signed)
Per Evelena Peat, pharmacist, okay to proceed with treatment today with CMET result from 3/25.

## 2015-02-26 NOTE — Patient Instructions (Addendum)
[facility] Discharge Instructions for Patients Receiving Chemotherapy  Today you received the following chemotherapy agents: Cytoxan.   To help prevent nausea and vomiting after your treatment, we encourage you to take your nausea medication as directed.    If you develop nausea and vomiting that is not controlled by your nausea medication, call the clinic.   BELOW ARE SYMPTOMS THAT SHOULD BE REPORTED IMMEDIATELY:  *FEVER GREATER THAN 100.5 F  *CHILLS WITH OR WITHOUT FEVER  NAUSEA AND VOMITING THAT IS NOT CONTROLLED WITH YOUR NAUSEA MEDICATION  *UNUSUAL SHORTNESS OF BREATH  *UNUSUAL BRUISING OR BLEEDING  TENDERNESS IN MOUTH AND THROAT WITH OR WITHOUT PRESENCE OF ULCERS  *URINARY PROBLEMS  *BOWEL PROBLEMS  UNUSUAL RASH Items with * indicate a potential emergency and should be followed up as soon as possible.  Feel free to call the clinic you have any questions or concerns. The clinic phone number is [phone].  Please show the [facility] at check-in to the Emergency Department and triage nurse.  Zoledronic Acid injection (Hypercalcemia, Oncology) What is this medicine? ZOLEDRONIC ACID (ZOE le dron ik AS id) lowers the amount of calcium loss from bone. It is used to treat too much calcium in your blood from cancer. It is also used to prevent complications of cancer that has spread to the bone. This medicine may be used for other purposes; ask your health care provider or pharmacist if you have questions. COMMON BRAND NAME(S): Zometa What should I tell my health care provider before I take this medicine? They need to know if you have any of these conditions: -aspirin-sensitive asthma -cancer, especially if you are receiving medicines used to treat cancer -dental disease or wear dentures -infection -kidney disease -receiving corticosteroids like dexamethasone or prednisone -an unusual or allergic reaction to zoledronic acid, other medicines, foods,  dyes, or preservatives -pregnant or trying to get pregnant -breast-feeding How should I use this medicine? This medicine is for infusion into a vein. It is given by a health care professional in a hospital or clinic setting. Talk to your pediatrician regarding the use of this medicine in children. Special care may be needed. Overdosage: If you think you have taken too much of this medicine contact a poison control center or emergency room at once. NOTE: This medicine is only for you. Do not share this medicine with others. What if I miss a dose? It is important not to miss your dose. Call your doctor or health care professional if you are unable to keep an appointment. What may interact with this medicine? -certain antibiotics given by injection -NSAIDs, medicines for pain and inflammation, like ibuprofen or naproxen -some diuretics like bumetanide, furosemide -teriparatide -thalidomide This list may not describe all possible interactions. Give your health care provider a list of all the medicines, herbs, non-prescription drugs, or dietary supplements you use. Also tell them if you smoke, drink alcohol, or use illegal drugs. Some items may interact with your medicine. What should I watch for while using this medicine? Visit your doctor or health care professional for regular checkups. It may be some time before you see the benefit from this medicine. Do not stop taking your medicine unless your doctor tells you to. Your doctor may order blood tests or other tests to see how you are doing. Women should inform their doctor if they wish to become pregnant or think they might be pregnant. There is a potential for serious side effects to an unborn child. Talk  to your health care professional or pharmacist for more information. You should make sure that you get enough calcium and vitamin D while you are taking this medicine. Discuss the foods you eat and the vitamins you take with your health care  professional. Some people who take this medicine have severe bone, joint, and/or muscle pain. This medicine may also increase your risk for jaw problems or a broken thigh bone. Tell your doctor right away if you have severe pain in your jaw, bones, joints, or muscles. Tell your doctor if you have any pain that does not go away or that gets worse. Tell your dentist and dental surgeon that you are taking this medicine. You should not have major dental surgery while on this medicine. See your dentist to have a dental exam and fix any dental problems before starting this medicine. Take good care of your teeth while on this medicine. Make sure you see your dentist for regular follow-up appointments. What side effects may I notice from receiving this medicine? Side effects that you should report to your doctor or health care professional as soon as possible: -allergic reactions like skin rash, itching or hives, swelling of the face, lips, or tongue -anxiety, confusion, or depression -breathing problems -changes in vision -eye pain -feeling faint or lightheaded, falls -jaw pain, especially after dental work -mouth sores -muscle cramps, stiffness, or weakness -trouble passing urine or change in the amount of urine Side effects that usually do not require medical attention (report to your doctor or health care professional if they continue or are bothersome): -bone, joint, or muscle pain -constipation -diarrhea -fever -hair loss -irritation at site where injected -loss of appetite -nausea, vomiting -stomach upset -trouble sleeping -trouble swallowing -weak or tired This list may not describe all possible side effects. Call your doctor for medical advice about side effects. You may report side effects to FDA at 1-800-FDA-1088. Where should I keep my medicine? This drug is given in a hospital or clinic and will not be stored at home. NOTE: This sheet is a summary. It may not cover all possible  information. If you have questions about this medicine, talk to your doctor, pharmacist, or health care provider.  2015, Elsevier/Gold Standard. (2013-04-17 13:03:13)  

## 2015-03-03 ENCOUNTER — Telehealth: Payer: Self-pay | Admitting: Oncology

## 2015-03-03 NOTE — Telephone Encounter (Signed)
per pof to sch pt-cld & left message to adv of 4/22 appt time & date

## 2015-03-12 ENCOUNTER — Other Ambulatory Visit (HOSPITAL_BASED_OUTPATIENT_CLINIC_OR_DEPARTMENT_OTHER): Payer: Medicare Other

## 2015-03-12 ENCOUNTER — Ambulatory Visit (HOSPITAL_BASED_OUTPATIENT_CLINIC_OR_DEPARTMENT_OTHER): Payer: Medicare Other

## 2015-03-12 VITALS — BP 146/59 | HR 74 | Temp 97.6°F | Resp 18

## 2015-03-12 DIAGNOSIS — Z5111 Encounter for antineoplastic chemotherapy: Secondary | ICD-10-CM

## 2015-03-12 DIAGNOSIS — M869 Osteomyelitis, unspecified: Secondary | ICD-10-CM

## 2015-03-12 DIAGNOSIS — C9000 Multiple myeloma not having achieved remission: Secondary | ICD-10-CM | POA: Diagnosis present

## 2015-03-12 DIAGNOSIS — I70209 Unspecified atherosclerosis of native arteries of extremities, unspecified extremity: Secondary | ICD-10-CM

## 2015-03-12 DIAGNOSIS — D630 Anemia in neoplastic disease: Secondary | ICD-10-CM | POA: Diagnosis not present

## 2015-03-12 DIAGNOSIS — L98499 Non-pressure chronic ulcer of skin of other sites with unspecified severity: Principal | ICD-10-CM

## 2015-03-12 DIAGNOSIS — I739 Peripheral vascular disease, unspecified: Secondary | ICD-10-CM

## 2015-03-12 LAB — CBC & DIFF AND RETIC
BASO%: 1.7 % (ref 0.0–2.0)
BASOS ABS: 0.1 10*3/uL (ref 0.0–0.1)
EOS%: 1.9 % (ref 0.0–7.0)
Eosinophils Absolute: 0.1 10*3/uL (ref 0.0–0.5)
HCT: 30.3 % — ABNORMAL LOW (ref 38.4–49.9)
HGB: 9.6 g/dL — ABNORMAL LOW (ref 13.0–17.1)
Immature Retic Fract: 10 % (ref 3.00–10.60)
LYMPH%: 26.3 % (ref 14.0–49.0)
MCH: 28.2 pg (ref 27.2–33.4)
MCHC: 31.7 g/dL — ABNORMAL LOW (ref 32.0–36.0)
MCV: 89.1 fL (ref 79.3–98.0)
MONO#: 0.9 10*3/uL (ref 0.1–0.9)
MONO%: 19.7 % — ABNORMAL HIGH (ref 0.0–14.0)
NEUT#: 2.3 10*3/uL (ref 1.5–6.5)
NEUT%: 50.4 % (ref 39.0–75.0)
NRBC: 0 % (ref 0–0)
Platelets: 137 10*3/uL — ABNORMAL LOW (ref 140–400)
RBC: 3.4 10*6/uL — ABNORMAL LOW (ref 4.20–5.82)
RDW: 21.7 % — AB (ref 11.0–14.6)
Retic %: 2.88 % — ABNORMAL HIGH (ref 0.80–1.80)
Retic Ct Abs: 97.92 10*3/uL — ABNORMAL HIGH (ref 34.80–93.90)
WBC: 4.6 10*3/uL (ref 4.0–10.3)
lymph#: 1.2 10*3/uL (ref 0.9–3.3)

## 2015-03-12 LAB — COMPREHENSIVE METABOLIC PANEL (CC13)
ALK PHOS: 50 U/L (ref 40–150)
ALT: 26 U/L (ref 0–55)
ANION GAP: 12 meq/L — AB (ref 3–11)
AST: 27 U/L (ref 5–34)
Albumin: 3.3 g/dL — ABNORMAL LOW (ref 3.5–5.0)
BUN: 26.9 mg/dL — ABNORMAL HIGH (ref 7.0–26.0)
CO2: 20 meq/L — AB (ref 22–29)
Calcium: 8.6 mg/dL (ref 8.4–10.4)
Chloride: 108 mEq/L (ref 98–109)
Creatinine: 1.2 mg/dL (ref 0.7–1.3)
EGFR: 56 mL/min/{1.73_m2} — AB (ref >90)
GLUCOSE: 125 mg/dL (ref 70–140)
POTASSIUM: 3.6 meq/L (ref 3.5–5.1)
Sodium: 139 mEq/L (ref 136–145)
Total Bilirubin: 0.54 mg/dL (ref 0.20–1.20)
Total Protein: 5.7 g/dL — ABNORMAL LOW (ref 6.4–8.3)

## 2015-03-12 LAB — TECHNOLOGIST REVIEW

## 2015-03-12 LAB — FERRITIN CHCC: Ferritin: 193 ng/ml (ref 22–316)

## 2015-03-12 MED ORDER — SODIUM CHLORIDE 0.9 % IV SOLN
Freq: Once | INTRAVENOUS | Status: AC
  Administered 2015-03-12: 12:00:00 via INTRAVENOUS
  Filled 2015-03-12: qty 4

## 2015-03-12 MED ORDER — DARBEPOETIN ALFA 500 MCG/ML IJ SOSY
500.0000 ug | PREFILLED_SYRINGE | Freq: Once | INTRAMUSCULAR | Status: AC
  Administered 2015-03-12: 500 ug via SUBCUTANEOUS
  Filled 2015-03-12: qty 1

## 2015-03-12 MED ORDER — SODIUM CHLORIDE 0.9 % IV SOLN
Freq: Once | INTRAVENOUS | Status: AC
  Administered 2015-03-12: 12:00:00 via INTRAVENOUS

## 2015-03-12 MED ORDER — SODIUM CHLORIDE 0.9 % IV SOLN
300.0000 mg/m2 | Freq: Once | INTRAVENOUS | Status: AC
  Administered 2015-03-12: 620 mg via INTRAVENOUS
  Filled 2015-03-12: qty 31

## 2015-03-16 LAB — KAPPA/LAMBDA LIGHT CHAINS
Kappa free light chain: 33.2 mg/dL — ABNORMAL HIGH (ref 0.33–1.94)
Kappa:Lambda Ratio: 28.38 — ABNORMAL HIGH (ref 0.26–1.65)
Lambda Free Lght Chn: 1.17 mg/dL (ref 0.57–2.63)

## 2015-03-19 NOTE — Progress Notes (Signed)
   Subjective:    Patient ID: Timothy [name], male    DOB: 31-Aug-1928, 81 y.o.   MRN: [id number] Chief Complaint  Patient presents with  . Wound Check    follow up left foot care         Objective:   Physical Exam   Objective: Neurovascular status unchanged with decreased pulses noted.Marland Kitchen Ulcer is improved and appears healed distal on left third toe. Hyperkeratotic tissue overlying his present and appears well attached. No malodor.Hyperkeratotic tissue is surrounding.    Assessment & Plan:  Healed ulceration left third toe  Plan: Recommended continuation of a dry sterile dressing on the left third toe. He'll watch for any signs of infection or skin breakdown. Otherwise he'll be seen back in 3-4 weeks for follow-up.

## 2015-03-24 ENCOUNTER — Ambulatory Visit: Payer: Medicare Other | Admitting: Podiatrist

## 2015-03-26 ENCOUNTER — Telehealth: Payer: Self-pay | Admitting: Oncology

## 2015-03-26 ENCOUNTER — Ambulatory Visit (HOSPITAL_BASED_OUTPATIENT_CLINIC_OR_DEPARTMENT_OTHER): Payer: Medicare Other

## 2015-03-26 ENCOUNTER — Other Ambulatory Visit (HOSPITAL_BASED_OUTPATIENT_CLINIC_OR_DEPARTMENT_OTHER): Payer: Medicare Other

## 2015-03-26 ENCOUNTER — Other Ambulatory Visit: Payer: Self-pay | Admitting: *Deleted

## 2015-03-26 VITALS — BP 155/62 | HR 72 | Temp 97.1°F | Resp 19

## 2015-03-26 DIAGNOSIS — I70209 Unspecified atherosclerosis of native arteries of extremities, unspecified extremity: Secondary | ICD-10-CM

## 2015-03-26 DIAGNOSIS — Z5111 Encounter for antineoplastic chemotherapy: Secondary | ICD-10-CM | POA: Diagnosis present

## 2015-03-26 DIAGNOSIS — D630 Anemia in neoplastic disease: Secondary | ICD-10-CM | POA: Diagnosis not present

## 2015-03-26 DIAGNOSIS — C9000 Multiple myeloma not having achieved remission: Secondary | ICD-10-CM | POA: Diagnosis present

## 2015-03-26 DIAGNOSIS — L98499 Non-pressure chronic ulcer of skin of other sites with unspecified severity: Principal | ICD-10-CM

## 2015-03-26 DIAGNOSIS — M869 Osteomyelitis, unspecified: Secondary | ICD-10-CM

## 2015-03-26 DIAGNOSIS — I739 Peripheral vascular disease, unspecified: Secondary | ICD-10-CM

## 2015-03-26 LAB — CBC WITH DIFFERENTIAL/PLATELET
BASO%: 1.9 % (ref 0.0–2.0)
BASOS ABS: 0.1 10*3/uL (ref 0.0–0.1)
EOS%: 2.9 % (ref 0.0–7.0)
Eosinophils Absolute: 0.1 10*3/uL (ref 0.0–0.5)
HCT: 31.3 % — ABNORMAL LOW (ref 38.4–49.9)
HGB: 10 g/dL — ABNORMAL LOW (ref 13.0–17.1)
LYMPH#: 1.4 10*3/uL (ref 0.9–3.3)
LYMPH%: 29.8 % (ref 14.0–49.0)
MCH: 28.4 pg (ref 27.2–33.4)
MCHC: 31.9 g/dL — AB (ref 32.0–36.0)
MCV: 88.9 fL (ref 79.3–98.0)
MONO#: 1.1 10*3/uL — ABNORMAL HIGH (ref 0.1–0.9)
MONO%: 22.7 % — AB (ref 0.0–14.0)
NEUT#: 2 10*3/uL (ref 1.5–6.5)
NEUT%: 42.7 % (ref 39.0–75.0)
Platelets: 170 10*3/uL (ref 140–400)
RBC: 3.52 10*6/uL — AB (ref 4.20–5.82)
RDW: 21.4 % — ABNORMAL HIGH (ref 11.0–14.6)
WBC: 4.8 10*3/uL (ref 4.0–10.3)
nRBC: 0 % (ref 0–0)

## 2015-03-26 LAB — TECHNOLOGIST REVIEW

## 2015-03-26 MED ORDER — DARBEPOETIN ALFA 500 MCG/ML IJ SOSY
500.0000 ug | PREFILLED_SYRINGE | Freq: Once | INTRAMUSCULAR | Status: AC
  Administered 2015-03-26: 500 ug via SUBCUTANEOUS
  Filled 2015-03-26: qty 1

## 2015-03-26 MED ORDER — SODIUM CHLORIDE 0.9 % IV SOLN
300.0000 mg/m2 | Freq: Once | INTRAVENOUS | Status: AC
  Administered 2015-03-26: 620 mg via INTRAVENOUS
  Filled 2015-03-26: qty 31

## 2015-03-26 MED ORDER — SODIUM CHLORIDE 0.9 % IV SOLN
Freq: Once | INTRAVENOUS | Status: AC
  Administered 2015-03-26: 11:00:00 via INTRAVENOUS

## 2015-03-26 MED ORDER — SODIUM CHLORIDE 0.9 % IV SOLN
Freq: Once | INTRAVENOUS | Status: AC
  Administered 2015-03-26: 11:00:00 via INTRAVENOUS
  Filled 2015-03-26: qty 4

## 2015-03-26 NOTE — Telephone Encounter (Signed)
Labs added per 05/06 POF, cancelled labs on 05/13 per Val.... KJ

## 2015-03-26 NOTE — Progress Notes (Signed)
No need for CMET today per Dr. [name].  OK to give Aranesp 500 mcg dose today per Dr. [name].

## 2015-03-26 NOTE — Patient Instructions (Addendum)
[facility] Discharge Instructions for Patients Receiving Chemotherapy  Today you received the following chemotherapy agents: Cytoxan.  To help prevent nausea and vomiting after your treatment, we encourage you to take your nausea medication as prescribed by your physician. If you develop nausea and vomiting that is not controlled by your nausea medication, call the clinic.   BELOW ARE SYMPTOMS THAT SHOULD BE REPORTED IMMEDIATELY:  *FEVER GREATER THAN 100.5 F  *CHILLS WITH OR WITHOUT FEVER  NAUSEA AND VOMITING THAT IS NOT CONTROLLED WITH YOUR NAUSEA MEDICATION  *UNUSUAL SHORTNESS OF BREATH  *UNUSUAL BRUISING OR BLEEDING  TENDERNESS IN MOUTH AND THROAT WITH OR WITHOUT PRESENCE OF ULCERS  *URINARY PROBLEMS  *BOWEL PROBLEMS  UNUSUAL RASH Items with * indicate a potential emergency and should be followed up as soon as possible.  Feel free to call the clinic you have any questions or concerns. The clinic phone number is [phone].  Please show the [facility] at check-in to the Emergency Department and triage nurse.  Darbepoetin Alfa injection What is this medicine? DARBEPOETIN ALFA (dar be POE e tin AL fa) helps your body make more red blood cells. It is used to treat anemia caused by chronic kidney failure and chemotherapy. This medicine may be used for other purposes; ask your health care provider or pharmacist if you have questions. COMMON BRAND NAME(S): Aranesp What should I tell my health care provider before I take this medicine? They need to know if you have any of these conditions: -blood clotting disorders or history of blood clots -cancer patient not on chemotherapy -cystic fibrosis -heart disease, such as angina, heart failure, or a history of a heart attack -hemoglobin level of 12 g/dL or greater -high blood pressure -low levels of folate, iron, or vitamin B12 -seizures -an unusual or allergic reaction to darbepoetin, erythropoietin,  albumin, hamster proteins, latex, other medicines, foods, dyes, or preservatives -pregnant or trying to get pregnant -breast-feeding How should I use this medicine? This medicine is for injection into a vein or under the skin. It is usually given by a health care professional in a hospital or clinic setting. If you get this medicine at home, you will be taught how to prepare and give this medicine. Do not shake the solution before you withdraw a dose. Use exactly as directed. Take your medicine at regular intervals. Do not take your medicine more often than directed. It is important that you put your used needles and syringes in a special sharps container. Do not put them in a trash can. If you do not have a sharps container, call your pharmacist or healthcare provider to get one. Talk to your pediatrician regarding the use of this medicine in children. While this medicine may be used in children as young as 1 year for selected conditions, precautions do apply. Overdosage: If you think you have taken too much of this medicine contact a poison control center or emergency room at once. NOTE: This medicine is only for you. Do not share this medicine with others. What if I miss a dose? If you miss a dose, take it as soon as you can. If it is almost time for your next dose, take only that dose. Do not take double or extra doses. What may interact with this medicine? Do not take this medicine with any of the following medications: -epoetin alfa This list may not describe all possible interactions. Give your health care provider a list of all the medicines, herbs,  non-prescription drugs, or dietary supplements you use. Also tell them if you smoke, drink alcohol, or use illegal drugs. Some items may interact with your medicine. What should I watch for while using this medicine? Visit your prescriber or health care professional for regular checks on your progress and for the needed blood tests and blood  pressure measurements. It is especially important for the doctor to make sure your hemoglobin level is in the desired range, to limit the risk of potential side effects and to give you the best benefit. Keep all appointments for any recommended tests. Check your blood pressure as directed. Ask your doctor what your blood pressure should be and when you should contact him or her. As your body makes more red blood cells, you may need to take iron, folic acid, or vitamin B supplements. Ask your doctor or health care provider which products are right for you. If you have kidney disease continue dietary restrictions, even though this medication can make you feel better. Talk with your doctor or health care professional about the foods you eat and the vitamins that you take. What side effects may I notice from receiving this medicine? Side effects that you should report to your doctor or health care professional as soon as possible: -allergic reactions like skin rash, itching or hives, swelling of the face, lips, or tongue -breathing problems -changes in vision -chest pain -confusion, trouble speaking or understanding -feeling faint or lightheaded, falls -high blood pressure -muscle aches or pains -pain, swelling, warmth in the leg -rapid weight gain -severe headaches -sudden numbness or weakness of the face, arm or leg -trouble walking, dizziness, loss of balance or coordination -seizures (convulsions) -swelling of the ankles, feet, hands -unusually weak or tired Side effects that usually do not require medical attention (report to your doctor or health care professional if they continue or are bothersome): -diarrhea -fever, chills (flu-like symptoms) -headaches -nausea, vomiting -redness, stinging, or swelling at site where injected This list may not describe all possible side effects. Call your doctor for medical advice about side effects. You may report side effects to FDA at  1-800-FDA-1088. Where should I keep my medicine? Keep out of the reach of children. Store in a refrigerator between 2 and 8 degrees C (36 and 46 degrees F). Do not freeze. Do not shake. Throw away any unused portion if using a single-dose vial. Throw away any unused medicine after the expiration date. NOTE: This sheet is a summary. It may not cover all possible information. If you have questions about this medicine, talk to your doctor, pharmacist, or health care provider.  2015, Elsevier/Gold Standard. (2008-10-20 10:23:57)

## 2015-03-29 ENCOUNTER — Other Ambulatory Visit: Payer: Self-pay | Admitting: *Deleted

## 2015-03-29 DIAGNOSIS — N289 Disorder of kidney and ureter, unspecified: Secondary | ICD-10-CM

## 2015-03-29 DIAGNOSIS — C9000 Multiple myeloma not having achieved remission: Secondary | ICD-10-CM

## 2015-03-30 ENCOUNTER — Other Ambulatory Visit (HOSPITAL_BASED_OUTPATIENT_CLINIC_OR_DEPARTMENT_OTHER): Payer: Medicare Other

## 2015-03-30 ENCOUNTER — Ambulatory Visit (INDEPENDENT_AMBULATORY_CARE_PROVIDER_SITE_OTHER): Payer: Medicare Other | Admitting: Cardiology

## 2015-03-30 ENCOUNTER — Encounter: Payer: Self-pay | Admitting: Cardiology

## 2015-03-30 VITALS — BP 120/66 | HR 86 | Ht 70.0 in | Wt 186.4 lb

## 2015-03-30 DIAGNOSIS — N289 Disorder of kidney and ureter, unspecified: Secondary | ICD-10-CM

## 2015-03-30 DIAGNOSIS — I5022 Chronic systolic (congestive) heart failure: Secondary | ICD-10-CM | POA: Diagnosis not present

## 2015-03-30 DIAGNOSIS — C9000 Multiple myeloma not having achieved remission: Secondary | ICD-10-CM

## 2015-03-30 DIAGNOSIS — I2510 Atherosclerotic heart disease of native coronary artery without angina pectoris: Secondary | ICD-10-CM

## 2015-03-30 DIAGNOSIS — D638 Anemia in other chronic diseases classified elsewhere: Secondary | ICD-10-CM | POA: Diagnosis not present

## 2015-03-30 DIAGNOSIS — I255 Ischemic cardiomyopathy: Secondary | ICD-10-CM

## 2015-03-30 DIAGNOSIS — Z951 Presence of aortocoronary bypass graft: Secondary | ICD-10-CM

## 2015-03-30 LAB — CBC WITH DIFFERENTIAL/PLATELET
BASO%: 1.4 % (ref 0.0–2.0)
BASOS ABS: 0.1 10*3/uL (ref 0.0–0.1)
EOS%: 0.7 % (ref 0.0–7.0)
Eosinophils Absolute: 0 10*3/uL (ref 0.0–0.5)
HCT: 31.3 % — ABNORMAL LOW (ref 38.4–49.9)
HEMOGLOBIN: 10.2 g/dL — AB (ref 13.0–17.1)
LYMPH#: 0.8 10*3/uL — AB (ref 0.9–3.3)
LYMPH%: 16.9 % (ref 14.0–49.0)
MCH: 28.7 pg (ref 27.2–33.4)
MCHC: 32.7 g/dL (ref 32.0–36.0)
MCV: 87.5 fL (ref 79.3–98.0)
MONO#: 0.1 10*3/uL (ref 0.1–0.9)
MONO%: 1.5 % (ref 0.0–14.0)
NEUT#: 3.5 10*3/uL (ref 1.5–6.5)
NEUT%: 79.5 % — ABNORMAL HIGH (ref 39.0–75.0)
Platelets: 188 10*3/uL (ref 140–400)
RBC: 3.58 10*6/uL — AB (ref 4.20–5.82)
RDW: 22.7 % — AB (ref 11.0–14.6)
WBC: 4.5 10*3/uL (ref 4.0–10.3)

## 2015-03-30 LAB — COMPREHENSIVE METABOLIC PANEL (CC13)
ALT: 23 U/L (ref 0–55)
ANION GAP: 10 meq/L (ref 3–11)
AST: 29 U/L (ref 5–34)
Albumin: 3.5 g/dL (ref 3.5–5.0)
Alkaline Phosphatase: 50 U/L (ref 40–150)
BUN: 29.7 mg/dL — ABNORMAL HIGH (ref 7.0–26.0)
CO2: 22 mEq/L (ref 22–29)
CREATININE: 1.4 mg/dL — AB (ref 0.7–1.3)
Calcium: 9 mg/dL (ref 8.4–10.4)
Chloride: 105 mEq/L (ref 98–109)
EGFR: 44 mL/min/{1.73_m2} — ABNORMAL LOW (ref >90)
GLUCOSE: 156 mg/dL — AB (ref 70–140)
POTASSIUM: 4.7 meq/L (ref 3.5–5.1)
Sodium: 137 mEq/L (ref 136–145)
Total Bilirubin: 0.8 mg/dL (ref 0.20–1.20)
Total Protein: 6.3 g/dL — ABNORMAL LOW (ref 6.4–8.3)

## 2015-03-30 LAB — TECHNOLOGIST REVIEW

## 2015-03-30 NOTE — Progress Notes (Signed)
Timothy [name] Date of Birth: [birth year] Medical Record #[id number]  History of Present Illness: Timothy [name] is seen  today for follow up  of CHF. He has a complex medical history which includes an ischemic CM with an EF of 35%. He has multiple myeloma with progressive anemia and is followed closely by oncology. He is on Decadron and Cytoxan. Other problems include HLD, depression, remote CABG in 2002, CKD, neuropathy, anemia and gout. He also has a history of hyponatremia.  He developed critical limb ischemia and underwent left femoral endarterectomy and patch angioplasty in May 2015. This year he has noted increased edema.  Echo and Myoview studies were done as noted below. He was started on lasix 40 mg daily. He still complains of dyspnea with activity.  He still has some edema and wears support hose. He has no chest pain. He enjoys getting out in his yard and uses a golf cart to get around.  Current Outpatient Prescriptions on File Prior to Visit  Medication Sig Dispense Refill  . acyclovir (ZOVIRAX) 400 MG tablet Take 1 tablet (400 mg total) by mouth 2 (two) times daily. 60 tablet 3  . allopurinol (ZYLOPRIM) 300 MG tablet Take 0.5 tablets (150 mg total) by mouth daily. 90 tablet 3  . amLODipine (NORVASC) 2.5 MG tablet Take 1 tablet (2.5 mg total) by mouth daily. 90 tablet 3  . aspirin EC 81 MG tablet Take 81 mg by mouth 3 (three) times a week. Monday, Wednesday, Friday    . atorvastatin (LIPITOR) 10 MG tablet Take 1 tablet (10 mg total) by mouth every evening. 90 tablet 3  . B Complex-Biotin-FA (B COMPLETE PO) Take 1 tablet by mouth daily.     . carvedilol (COREG) 6.25 MG tablet Take 1 tablet (6.25 mg total) by mouth 2 (two) times daily with a meal. 180 tablet 3  . Coenzyme Q10 (CO Q-10 PO) Take 1 tablet by mouth every morning.     . darbepoetin (ARANESP) 200 MCG/0.4ML SOLN injection Inject 200 mcg into the skin as directed.    Marland Kitchen dexamethasone (DECADRON) 4 MG tablet Take five tablets 2  consecutive days every week 40 tablet 6  . diphenoxylate-atropine (LOMOTIL) 2.5-0.025 MG per tablet Take 1 tablet by mouth 4 (four) times daily as needed for diarrhea or loose stools. 30 tablet 0  . furosemide (LASIX) 40 MG tablet Take 1 tablet (40 mg total) by mouth daily. 90 tablet 1  . HYDROmorphone (DILAUDID) 4 MG tablet Take 1 tablet (4 mg total) by mouth every 4 (four) hours as needed for severe pain. 120 tablet 0  . isosorbide mononitrate (IMDUR) 30 MG 24 hr tablet Take 1 tablet (30 mg total) by mouth daily. 90 tablet 3  . lisinopril (PRINIVIL,ZESTRIL) 20 MG tablet Take 20 mg by mouth daily.    . Multiple Vitamin (MULTIVITAMIN) capsule Take 1 capsule by mouth every morning.     . nitroGLYCERIN (NITROSTAT) 0.4 MG SL tablet Place 1 tablet (0.4 mg total) under the tongue every 5 (five) minutes as needed for chest pain. 25 tablet 3  . ondansetron (ZOFRAN) 8 MG tablet Take 1 tablet (8 mg total) by mouth 2 (two) times daily as needed (Nausea or vomiting). 30 tablet 1  . polyethylene glycol (MIRALAX / GLYCOLAX) packet Take 17 g by mouth daily as needed. 14 each 0  . rOPINIRole (REQUIP) 3 MG tablet Take 1 tablet (3 mg total) by mouth at bedtime. 90 tablet 4  . silver sulfADIAZINE (SILVADENE) 1 %  cream Apply 1 application topically daily. 50 g 0  . tamsulosin (FLOMAX) 0.4 MG CAPS capsule Take 0.4 mg by mouth at bedtime.    . vitamin C (ASCORBIC ACID) 500 MG tablet Take 500 mg by mouth every morning.     No current facility-administered medications on file prior to visit.    Allergies  Allergen Reactions  . Morphine And Related Other (See Comments)    Agitated with morphine drip    Past Medical History  Diagnosis Date  . ASCVD (arteriosclerotic cardiovascular disease)   . Anemia     chronic mild anemia  . Gout   . Hypertension   . Lumbar disc disease     post laminectomy  . Chronic back pain   . History of echocardiogram 11/02/2010    EF range of 30 to 35% / There is hypokinsesis of  the basal-mild inferolateral myocardium  . CHF (congestive heart failure)   . Heart murmur   . Ischemic cardiomyopathy   . Renal insufficiency   . Peripheral neuropathy   . Inferior myocardial infarction 1986  . Hyperlipidemia   . SOB (shortness of breath)   . Fatigue   . Peripheral neuropathy   . Coronary artery disease     CABG 2002 by Dr. Roxy Manns with LIMA to LAD, SVG to intermediate, SVG to LCX, & SVG to PL. Last nuclear in 2012 showing large inferior scar with EF of 39%.   . Blood transfusion     DEC 2012 - TWO UNITS  . Myeloma     Multiple myeloma, with recurrence of increasing problems  DR. Tilleda -ONCOLOGIST.  PT HAS BEEN OFF CHEMO SINCE HIS HOSPITALIZATION FEB 2013 FOR LEFT FOOT INFECTION  . Cancer   . History of shingles MARCH 2009    SHINGLE LESIONS WERE AROUND RIGHT EYE--PT HAS RESIDUAL ITCHING AROUND THE EYE.  . Osteomyelitis     s/p toe amputation  . Chronic systolic heart failure     EF of 30% per echo 02/2012  . Protein malnutrition   . Critical lower limb ischemia   . Peripheral vascular disease   . Hx of cardiovascular stress test     Lexiscan Myoview (1/16):  Large inferior and apical scar; no ischemia, EF 27%;  High Risk    Past Surgical History  Procedure Laterality Date  . Laminectomy    . Inguinal hernia repair    . Cardiac catheterization  06/20/2000    Severe coronary disease (totally occluded right artery, 90% left circumflex, 50-60% intermediate, and 90-95% ostial left anterior descending)   . Tonsillectomy    . Coronary artery bypass graft  2002    x4 / Left internal mammary artery to the LAD.  / Saphenous vein graft to the right posterolateral.  /  Saphenous vein graft to  the ramus intermedius. / Saphenous vein graft to the circumflex marginal     . Amputation  03/06/2012    Procedure: AMPUTATION RAY;  Surgeon: Alta Corning, MD;  Location: [facility];  Service: Orthopedics;  Laterality: Left;  First Ray Amputation  . Intramedullary (im) nail  intertrochanteric Right 08/17/2013    Procedure: INTRAMEDULLARY (IM) NAIL INTERTROCHANTRIC;  Surgeon: Marybelle Killings, MD;  Location: [facility];  Service: Orthopedics;  Laterality: Right;  . Endarterectomy femoral Left 03/24/2014    Procedure: Left Common Femoral  Artery Endarterectomy with Vein Patch Angioplasty,  Ultrasound ;  Surgeon: Angelia Mould, MD;  Location: [facility];  Service: Vascular;  Laterality: Left;  .  Toe amputation    . Lower extremity angiogram N/A 03/23/2014    Procedure: LOWER EXTREMITY ANGIOGRAM;  Surgeon: Lorretta Harp, MD;  Location[facility] C[facility] LAB;  Service: Cardiovascular;  Laterality: N/A;    History  Smoking status  . Former Smoker -- 1.00 packs/day for 20 years  . Types: Cigarettes  . Quit date: 11/20/1984  Smokeless tobacco  . Never Used    History  Alcohol Use No    Family History  Problem Relation Age of Onset  . Heart attack Mother   . Heart disease Father     Review of Systems: The review of systems is per the HPI.  All other systems were reviewed and are negative.  Physical Exam: BP 120/66 mmHg  Pulse 86  Ht $R'5\' 10"'iX$  (1.778 m)  Wt 186 lb 6.4 oz (84.55 kg)  BMI 26.75 kg/m2 Patient is very pleasant, obese and in no acute distress. Skin is warm and dry. Color is normal.  HEENT is unremarkable. Normocephalic/atraumatic. PERRL. Sclera are nonicteric. Neck is supple. No masses. No JVD. Lungs  are clear. Cardiac exam shows a regular rate and rhythm. Normal S1-2. No gallop or murmur. Abdomen is soft. Extremities reveal 1+ edema. Gait and ROM are intact. No gross neurologic deficits noted.  LABORATORY DATA:  Ecg today shows NSR with occ. PVC. NSIVCD, ST-T change c/w lateral ischemia. No change from 03/26/14. I have personally reviewed and interpreted this study.   Lab Results  Component Value Date   WBC 4.8 03/26/2015   HGB 10.0* 03/26/2015   HCT 31.3* 03/26/2015   PLT 170 03/26/2015   GLUCOSE 125 03/12/2015   CHOL 102 08/13/2013   TRIG 75  08/13/2013   HDL 37* 08/13/2013   LDLCALC 50 08/13/2013   ALT 26 03/12/2015   AST 27 03/12/2015   NA 139 03/12/2015   K 3.6 03/12/2015   CL 96 08/11/2014   CREATININE 1.2 03/12/2015   BUN 26.9* 03/12/2015   CO2 20* 03/12/2015   TSH 3.01 11/11/2014   PSA 3.75 05/30/2006   INR 1.15 03/23/2014  BNP 138 in December.   Echo 12/09/13:Study Conclusions  - Left ventricle: Septal and apical hypokinesis posterior lateral and inferobasal akinesis. The cavity size was moderately dilated. Wall thickness was increased in a pattern of mild LVH. The estimated ejection fraction was 35%. - Mitral valve: There was mild regurgitation. - Left atrium: The atrium was moderately dilated. - Atrial septum: No defect or patent foramen ovale was identified.  Cardiology Nuclear Med Study  EDISON NICHOLSON is a 81 y.o. male MRN : [id number] DOB: 03-Oct-1928  Procedure Date: 11/24/2014  Nuclear Med Background Indication for Stress Test: Evaluation for Ischemia and Graft Patency History: CAD-CABG, 12/18/12 MPI: EF: 41% Scar minimal peri-infarct ischemia Cardiac Risk Factors: Hypertension  Symptoms: SOB   Nuclear Pre-Procedure Caffeine/Decaff Intake: None> 12 hrs NPO After: 7:30am   Lungs: clear O2 Sat: 97% on room air. IV 0.9% NS with Angio Cath: 22g  IV Site: R Hand x 1, tolerated well IV Started by: Irven Baltimore, RN  Chest Size (in): 46 Cup Size: n/a  Height: $Remove'5\' 10"'lougLUu$  (1.778 m)  Weight: 193 lb (87.544 kg)  BMI: Body mass index is 27.69 kg/(m^2). Tech Comments: Patient took Carvedilol this am. Irven Baltimore, RN.    Nuclear Med Study 1 or 2 day study: 1 day  Stress Test Type: Carlton Adam  Reading MD: N/A  Order Authorizing Provider: Bunnie Lederman [place], MD  Resting Radionuclide: Technetium 49m Sestamibi  Resting  Radionuclide Dose: 11.0 mCi   Stress Radionuclide: Technetium 73m Sestamibi  Stress Radionuclide Dose: 33.0 mCi      Stress Protocol Rest HR: 56  Stress HR: 76  Rest BP: 136/54 Stress BP: 127/65  Exercise Time (min): n/a METS: n/a   Predicted Max HR: 136 bpm % Max HR: 55.88 bpm Rate Pressure Product: 10336   Dose of Adenosine (mg): n/a Dose of Lexiscan: 0.4 mg  Dose of Atropine (mg): n/a Dose of Dobutamine: n/a mcg/kg/min (at max HR)  Stress Test Technologist: Perrin Maltese, EMT-P  Nuclear Technologist: Earl Many, CNMT     Rest Procedure: Myocardial perfusion imaging was performed at rest 45 minutes following the intravenous administration of Technetium 76m Sestamibi. Rest ECG: NSR - Normal EKG  Stress Procedure: The patient received IV Lexiscan 0.4 mg over 15-seconds. Technetium 56m Sestamibi injected at 30-seconds. This patient had sob, chest fullness, and was flushed with the [facility] injection. Quantitative spect images were obtained after a 45 minute delay. Stress ECG: No significant change from baseline ECG and There are scattered PVCs.  QPS Raw Data Images: Normal; no motion artifact; normal heart/lung ratio. Stress Images: Fixed inferior and apical defects Rest Images: Fixed inferior and apical defects Subtraction (SDS): No evidence of ischemia. Transient Ischemic Dilatation (Normal <1.22): 1.19 Lung/Heart Ratio (Normal <0.45): 0.36  Quantitative Gated Spect Images QGS EDV: 175 ml QGS ESV: 127 ml  Impression Exercise Capacity: Lexiscan with no exercise. BP Response: Normal blood pressure response. Clinical Symptoms: Mild chest pain/dyspnea. ECG Impression: No significant ECG changes with Lexiscan. Comparison with Prior Nuclear Study: EF 41% with inferior scar  Overall Impression: High risk stress nuclear study with moderate intensity, large size fixed inferior and apical scar. No reversible ischemia.  LV Ejection Fraction: 27%. LV Wall Motion: Global hypokinesis with inferior akinesis/dyskinesis   Pixie Casino, MD, [place] Certified in Nuclear  Cardiology Attending Cardiologist [facility]    Assessment / Plan: 1. Dyspnea on exertion. Mostly related to CHF. Treatment limited. Unable to tolerate higher lasix dose due to frequent urination.  He is also anemic. There is a possibility of coronary ischemia since he is 14 years out from CABG but his recent myoview did not show definite ischemia. I have recommended continuing lasix 40 mg daily. He is reluctant to pursue heart cath at this time given the risk of contrast induced nephropathy and I concur.  I have recommended continuing his current therapy and follow up in 3 months.  2. Chronic systolic CHF. EF is stable by Echo at 35%. Patient has a known ischemic cardiomyopathy.  Continue lasix 40 mg daily and sodium and fluid restriction.  3. Multiple myeloma - followed by oncology. On Decadron and Cytoxan.  4. HLD - on statin therapy.   5. Coronary disease status post CABG in 2002. Dyspnea could be anginal equivalent symptoms and in fact this is how he initially presented.  Myoview study shows a large inferior and apical scar without ischemia.  Continue  medical therapy for now.  6. Hyponatremia. Improved.  7. Anemia- chronic. On Aranesp as needed per oncology.  I have recommended stopping amlodipine at this time. I think it is contributing little to his symptoms and may be aggravating his edema.

## 2015-03-30 NOTE — Patient Instructions (Signed)
Stop amlodipine   Continue your other medication and sodium restriction

## 2015-04-02 ENCOUNTER — Telehealth: Payer: Self-pay | Admitting: *Deleted

## 2015-04-02 ENCOUNTER — Other Ambulatory Visit: Payer: Medicare Other

## 2015-04-02 ENCOUNTER — Ambulatory Visit (HOSPITAL_BASED_OUTPATIENT_CLINIC_OR_DEPARTMENT_OTHER): Payer: Medicare Other | Admitting: Oncology

## 2015-04-02 ENCOUNTER — Telehealth: Payer: Self-pay | Admitting: Oncology

## 2015-04-02 VITALS — BP 117/54 | HR 18 | Temp 97.7°F | Ht 70.0 in | Wt 185.8 lb

## 2015-04-02 DIAGNOSIS — G620 Drug-induced polyneuropathy: Secondary | ICD-10-CM

## 2015-04-02 DIAGNOSIS — N183 Chronic kidney disease, stage 3 (moderate): Secondary | ICD-10-CM | POA: Diagnosis not present

## 2015-04-02 DIAGNOSIS — C9000 Multiple myeloma not having achieved remission: Secondary | ICD-10-CM | POA: Diagnosis present

## 2015-04-02 NOTE — Telephone Encounter (Signed)
Per staff message and POF I have scheduled appts. Advised scheduler of appts. JMW  

## 2015-04-02 NOTE — Progress Notes (Signed)
ID: [name]   DOB: Jul 23, 1928  MR#: [id number]  IDP#:[id number]  PCP:  SU: OTHER MD: Dorna Leitz, Alysia Penna, Georgia Duff, Geryl Councilman  CHIEF COMPLAINT: multiple myeloma  CURRENT THERAPY: cyclophosphamide, dexamethasone, darbepoetin   MYELOMA HISTORY: From the earlier summary:  Patient presented in April 2007 with symptomatic slowly progressive anemia, with normal iron parameters, folic acid, and vitamin B12. Plans were made to obtain bone marrow aspirate and biopsy as an outpatient, however the patient was hospitalized shortly thereafter with a creatinine of 8.6. Renal biopsy demonstrated the presence of  light chain deposition disease. Bone marrow biopsy demonstrated 37% plasma cells. A skeletal survey showed small calvarial lytic lesions, mild osteopenia, and cervical spondylosis. With a well-established diagnosis of light-chain multiple myeloma Dr. Melodie Bouillon subsequent treatments are as detailed below.   INTERVAL HISTORY: [name] returns today for follow up of his multiple myeloma accompanied by his wife Stephanie Coup.. Today is day 8 cycle 12 of monthly cyclophosphamide. He is tolerating this remarkably well. He has no nausea or vomiting, no fatigue, and his counts are if anything slightly improved. He is also on dexamethasone weekly as before. He has no side effects from that but he is aware of.  REVIEW OF SYSTEMS: [name] continues to be severely limited by his neuropathy as well of course as his other chronic problems including the congestive heart failure issues. He does get out of the house more and supervises the yardwork which he no longer is able to do. He does not like the lyric and finds it very difficult to stay on that medication. It just makes him feel "bad". He is relying on the allotted, which he uses appropriately in order to be more active. It does not put him to sleep. He is managing the constipation  that it tends to cause very successfully. Otherwise a detailed review of systems today was stable.  PAST MEDICAL HISTORY: Past Medical History  Diagnosis Date  . ASCVD (arteriosclerotic cardiovascular disease)   . Anemia     chronic mild anemia  . Gout   . Hypertension   . Lumbar disc disease     post laminectomy  . Chronic back pain   . History of echocardiogram 11/02/2010    EF range of 30 to 35% / There is hypokinsesis of the basal-mild inferolateral myocardium  . CHF (congestive heart failure)   . Heart murmur   . Ischemic cardiomyopathy   . Renal insufficiency   . Peripheral neuropathy   . Inferior myocardial infarction 1986  . Hyperlipidemia   . SOB (shortness of breath)   . Fatigue   . Peripheral neuropathy   . Coronary artery disease     CABG 2002 by Dr. Roxy Manns with LIMA to LAD, SVG to intermediate, SVG to LCX, & SVG to PL. Last nuclear in 2012 showing large inferior scar with EF of 39%.   . Blood transfusion     DEC 2012 - TWO UNITS  . Myeloma     Multiple myeloma, with recurrence of increasing problems  DR. [place] -ONCOLOGIST.  PT HAS BEEN OFF CHEMO SINCE HIS HOSPITALIZATION FEB 2013 FOR LEFT FOOT INFECTION  . Cancer   . History of shingles MARCH 2009    SHINGLE LESIONS WERE AROUND RIGHT EYE--PT HAS RESIDUAL ITCHING AROUND THE EYE.  . Osteomyelitis     s/p toe amputation  . Chronic systolic heart failure     EF of 30% per echo 02/2012  .  Protein malnutrition   . Critical lower limb ischemia   . Peripheral vascular disease   . Hx of cardiovascular stress test     Lexiscan Myoview (1/16):  Large inferior and apical scar; no ischemia, EF 27%;  High Risk   PAST SURGICAL HISTORY: Past Surgical History  Procedure Laterality Date  . Laminectomy    . Inguinal hernia repair    . Cardiac catheterization  06/20/2000    Severe coronary disease (totally occluded right artery, 90% left circumflex, 50-60% intermediate, and 90-95% ostial left anterior descending)   .  Tonsillectomy    . Coronary artery bypass graft  2002    x4 / Left internal mammary artery to the LAD.  / Saphenous vein graft to the right posterolateral.  /  Saphenous vein graft to  the ramus intermedius. / Saphenous vein graft to the circumflex marginal     . Amputation  03/06/2012    Procedure: AMPUTATION RAY;  Surgeon: Alta Corning, MD;  Location: [facility];  Service: Orthopedics;  Laterality: Left;  First Ray Amputation  . Intramedullary (im) nail intertrochanteric Right 08/17/2013    Procedure: INTRAMEDULLARY (IM) NAIL INTERTROCHANTRIC;  Surgeon: Marybelle Killings, MD;  Location: [facility];  Service: Orthopedics;  Laterality: Right;  . Endarterectomy femoral Left 03/24/2014    Procedure: Left Common Femoral  Artery Endarterectomy with Vein Patch Angioplasty,  Ultrasound ;  Surgeon: Angelia Mould, MD;  Location: [facility];  Service: Vascular;  Laterality: Left;  . Toe amputation    . Lower extremity angiogram N/A 03/23/2014    Procedure: LOWER EXTREMITY ANGIOGRAM;  Surgeon: Lorretta Harp, MD;  Location: [facility] CATH [facility];  Service: Cardiovascular;  Laterality: N/A;    FAMILY HISTORY No family history of hematologic malignancies; brother had prostate cancer; no other cancers in the immediate family  SOCIAL HISTORY: Retired Animal nutritionist; children from prior marriage. He and his wife [name] are the only ones at home.    ADVANCED DIRECTIVES: [name] has wife is his healthcare part of attorney  HEALTH MAINTENANCE: History  Substance Use Topics  . Smoking status: Former Smoker -- 1.00 packs/day for 20 years    Types: Cigarettes    Quit date: 11/20/1984  . Smokeless tobacco: Never Used  . Alcohol Use: No     Colonoscopy:  Bone density:  Lipid panel:  Allergies  Allergen Reactions  . Morphine And Related Other (See Comments)    Agitated with morphine drip    Current Outpatient Prescriptions  Medication Sig Dispense Refill  . acyclovir (ZOVIRAX) 400 MG tablet Take 1 tablet (400 mg total)  by mouth 2 (two) times daily. 60 tablet 3  . allopurinol (ZYLOPRIM) 300 MG tablet Take 0.5 tablets (150 mg total) by mouth daily. 90 tablet 3  . aspirin EC 81 MG tablet Take 81 mg by mouth 3 (three) times a week. Monday, Wednesday, Friday    . atorvastatin (LIPITOR) 10 MG tablet Take 1 tablet (10 mg total) by mouth every evening. 90 tablet 3  . B Complex-Biotin-FA (B COMPLETE PO) Take 1 tablet by mouth daily.     . carvedilol (COREG) 6.25 MG tablet Take 1 tablet (6.25 mg total) by mouth 2 (two) times daily with a meal. 180 tablet 3  . Coenzyme Q10 (CO Q-10 PO) Take 1 tablet by mouth every morning.     . darbepoetin (ARANESP) 200 MCG/0.4ML SOLN injection Inject 200 mcg into the skin as directed.    Marland Kitchen dexamethasone (DECADRON) 4 MG tablet Take five  tablets 2 consecutive days every week 40 tablet 6  . diphenoxylate-atropine (LOMOTIL) 2.5-0.025 MG per tablet Take 1 tablet by mouth 4 (four) times daily as needed for diarrhea or loose stools. 30 tablet 0  . furosemide (LASIX) 40 MG tablet Take 1 tablet (40 mg total) by mouth daily. 90 tablet 1  . HYDROmorphone (DILAUDID) 4 MG tablet Take 1 tablet (4 mg total) by mouth every 4 (four) hours as needed for severe pain. 120 tablet 0  . isosorbide mononitrate (IMDUR) 30 MG 24 hr tablet Take 1 tablet (30 mg total) by mouth daily. 90 tablet 3  . lisinopril (PRINIVIL,ZESTRIL) 20 MG tablet Take 20 mg by mouth daily.    . Multiple Vitamin (MULTIVITAMIN) capsule Take 1 capsule by mouth every morning.     . nitroGLYCERIN (NITROSTAT) 0.4 MG SL tablet Place 1 tablet (0.4 mg total) under the tongue every 5 (five) minutes as needed for chest pain. 25 tablet 3  . ondansetron (ZOFRAN) 8 MG tablet Take 1 tablet (8 mg total) by mouth 2 (two) times daily as needed (Nausea or vomiting). 30 tablet 1  . polyethylene glycol (MIRALAX / GLYCOLAX) packet Take 17 g by mouth daily as needed. 14 each 0  . rOPINIRole (REQUIP) 3 MG tablet Take 1 tablet (3 mg total) by mouth at bedtime. 90  tablet 4  . silver sulfADIAZINE (SILVADENE) 1 % cream Apply 1 application topically daily. 50 g 0  . tamsulosin (FLOMAX) 0.4 MG CAPS capsule Take 0.4 mg by mouth at bedtime.    Marland Kitchen tobramycin (TOBREX) 0.3 % ophthalmic solution Place 1 drop into both eyes daily as needed.  4  . vitamin C (ASCORBIC ACID) 500 MG tablet Take 500 mg by mouth every morning.     No current facility-administered medications for this visit.    Objective: Older white man using a walker to ambulate Filed Vitals:   04/02/15 1143  BP: 117/54  Pulse: 18  Temp: 97.7 F (36.5 C)        Body mass index is 26.66 kg/(m^2).    ECOG FS: 2 Filed Weights   04/02/15 1143  Weight: 185 lb 12.8 oz (84.278 kg)   Sclerae unicteric, EOMs intact Oropharynx clear, dentition in good repair No cervical or supraclavicular adenopathy Lungs no rales or rhonchi Heart regular rate and rhythm Abd soft, nontender, positive bowel sounds MSK no focal spinal tenderness, no joint edema Neuro: nonfocal, well oriented, positive affect   LAB RESULTS:  Results for HUMZAH, HARTY (MRN [id number]) as of 04/02/2015 12:07  Ref. Range 12/29/2014 10:35 01/15/2015 10:14 01/29/2015 10:48 02/12/2015 14:37 03/12/2015 10:31  Kappa free light chain Latest Ref Range: 0.33-1.94 mg/dL  57.90 (H) 51.40 (H) 49.10 (H) 33.20 (H)  Lambda Free Lght Chn Latest Ref Range: 0.57-2.63 mg/dL  1.58 1.54 1.61 1.17  Kappa:Lambda Ratio Latest Ref Range: 0.26-1.65   36.65 (H) 33.38 (H) 30.50 (H) 28.38 (H)   Result  Lab Results  Component Value Date   WBC 4.5 03/30/2015   NEUTROABS 3.5 03/30/2015   HGB 10.2* 03/30/2015   HCT 31.3* 03/30/2015   MCV 87.5 03/30/2015   PLT 188 03/30/2015      Chemistry      Component Value Date/Time   NA 137 03/30/2015 1459   NA 126* 08/11/2014 1052   K 4.7 03/30/2015 1459   K 4.0 08/11/2014 1052   CL 96 08/11/2014 1052   CL 98 05/12/2013 1048   CO2 22 03/30/2015 1459   CO2 24  08/11/2014 1052   BUN 29.7* 03/30/2015 1459   BUN  12 08/11/2014 1052   CREATININE 1.4* 03/30/2015 1459   CREATININE 1.0 08/11/2014 1052      Component Value Date/Time   CALCIUM 9.0 03/30/2015 1459   CALCIUM 9.0 08/11/2014 1052   ALKPHOS 50 03/30/2015 1459   ALKPHOS 42 03/25/2014 2115   AST 29 03/30/2015 1459   AST 25 03/25/2014 2115   ALT 23 03/30/2015 1459   ALT 10 03/25/2014 2115   BILITOT 0.80 03/30/2015 1459   BILITOT 0.7 03/25/2014 2115      STUDIES: No results found.   ASSESSMENT: 81 y.o.  with kappa light chain multiple myeloma   (1) presenting April 2007 with anemia and renal failure; renal Bx showing free kappa light chain deposition; bone marrow biopsy showing 37% plasma cells, with normal cytogenetics and FISH;bone survey showing skull lytic lesions; treated with   (2) thalidomide (200 mg/d) and dexamethasone (40 mg/d x4d Q28d) June through Nov 2007, with good response, but poor tolerance;   (3) thalidomide decreased to 100 mg/d, dexamethasone continued, bortezomib (IV) added, Dec 2007 to March 2008   (4) treatment interrupted by multiple complications (peripheral neuropathy, pulmonary embolism, diverticular abscess, CN V zoster, severe DDD, congestive heart failure, rising PSA); maintenance zolendronic acid through March 2012   (5) progression April 2012, treated initially with dexamethasone alone, poorly tolerated;   (6) bortezomib (sq) resumed July 2012; cyclophosphamide and dexamethasone added Sept 2012; zolendronic acid changed to Q 61months; all treatments held as of March 2013 due to the development of the Left foot ulcer described above  (7) anemia, on aranesp December 2012 to August 2013; resumed Q14d starting May 2015, with slowly rising reticulocyte count  (8) status post Right trochanteric nail with proximal and distal interlock DePuy 11 mm one-pin lag screw, 44 mm distal interlock 08/09/2013 for a right comminuted intertrochanteric fracture  (9) status post removal of the first 2 toes left foot  (10)  Left common femoral artery endarterectomy with vein patch angioplasty using left greater saphenous vein 03/24/2014  (11) status post fall with injury to the right hip, large hematoma, requiring [facility] until 07/30/2014  (12) on 10/09/2014 starting cyclophosphamide at 300 mg/m every 14 days and dexamethasone 20 mg daily 2 days each week  (13) SIADH: On fluid restriction, reduced to 1.5L on 12/04/14 because of drop in sodium  (14) chronic systolic congestive heart failure, with EF  35% per echo, with history CAD s/p CABG 2002  (15) CKD stage III  PLAN:   [name] is tolerating the cyclophosphamide remarkably well. He is essentially asymptomatic from it. It is also controlling his myeloma, with falling kappa light chain numbers, without making his peripheral neuropathy worse.  Accordingly we are continuing with these treatments. His next one will be May 20, then mid-June and mid July. I'm going to see him in late July, to review his lab work and make sure that things continue to be as stable/improving as currently.  I wish I had a solution for his peripheral neuropathy. He is tolerating the Lyrica with some difficulty. I did give him a hydromorphone prescription today with no date in case he runs out of her weekend and has difficulty getting it refilled.  [name] has a good understanding of this plan. He agrees with it. He will call me with any problems that may develop before the next visit here.  MAGRINAT,GUSTAV C    04/02/2015

## 2015-04-02 NOTE — Telephone Encounter (Signed)
Gave avs & calendar for May thru July. °

## 2015-04-07 ENCOUNTER — Ambulatory Visit (INDEPENDENT_AMBULATORY_CARE_PROVIDER_SITE_OTHER): Payer: Medicare Other | Admitting: Podiatrist

## 2015-04-07 ENCOUNTER — Encounter: Payer: Self-pay | Admitting: Podiatrist

## 2015-04-07 VITALS — BP 138/68 | HR 60 | Resp 12

## 2015-04-07 DIAGNOSIS — L89891 Pressure ulcer of other site, stage 1: Secondary | ICD-10-CM

## 2015-04-07 DIAGNOSIS — I255 Ischemic cardiomyopathy: Secondary | ICD-10-CM

## 2015-04-07 DIAGNOSIS — L98494 Non-pressure chronic ulcer of skin of other sites with necrosis of bone: Secondary | ICD-10-CM

## 2015-04-07 DIAGNOSIS — I739 Peripheral vascular disease, unspecified: Secondary | ICD-10-CM | POA: Diagnosis not present

## 2015-04-09 ENCOUNTER — Ambulatory Visit (HOSPITAL_BASED_OUTPATIENT_CLINIC_OR_DEPARTMENT_OTHER): Payer: Medicare Other

## 2015-04-09 ENCOUNTER — Other Ambulatory Visit (HOSPITAL_BASED_OUTPATIENT_CLINIC_OR_DEPARTMENT_OTHER): Payer: Medicare Other

## 2015-04-09 ENCOUNTER — Other Ambulatory Visit: Payer: Self-pay | Admitting: Oncology

## 2015-04-09 ENCOUNTER — Telehealth: Payer: Self-pay | Admitting: Oncology

## 2015-04-09 VITALS — BP 119/64 | HR 72 | Temp 97.9°F | Resp 18

## 2015-04-09 DIAGNOSIS — L98499 Non-pressure chronic ulcer of skin of other sites with unspecified severity: Principal | ICD-10-CM

## 2015-04-09 DIAGNOSIS — Z5111 Encounter for antineoplastic chemotherapy: Secondary | ICD-10-CM | POA: Diagnosis present

## 2015-04-09 DIAGNOSIS — M869 Osteomyelitis, unspecified: Secondary | ICD-10-CM

## 2015-04-09 DIAGNOSIS — D630 Anemia in neoplastic disease: Secondary | ICD-10-CM | POA: Diagnosis not present

## 2015-04-09 DIAGNOSIS — C9000 Multiple myeloma not having achieved remission: Secondary | ICD-10-CM

## 2015-04-09 DIAGNOSIS — I70209 Unspecified atherosclerosis of native arteries of extremities, unspecified extremity: Secondary | ICD-10-CM

## 2015-04-09 DIAGNOSIS — I739 Peripheral vascular disease, unspecified: Secondary | ICD-10-CM

## 2015-04-09 LAB — CBC & DIFF AND RETIC
BASO%: 2.7 % — ABNORMAL HIGH (ref 0.0–2.0)
BASOS ABS: 0.1 10*3/uL (ref 0.0–0.1)
EOS%: 2.7 % (ref 0.0–7.0)
Eosinophils Absolute: 0.1 10*3/uL (ref 0.0–0.5)
HCT: 31.4 % — ABNORMAL LOW (ref 38.4–49.9)
HEMOGLOBIN: 9.9 g/dL — AB (ref 13.0–17.1)
IMMATURE RETIC FRACT: 10.5 % (ref 3.00–10.60)
LYMPH%: 23.1 % (ref 14.0–49.0)
MCH: 28.2 pg (ref 27.2–33.4)
MCHC: 31.5 g/dL — ABNORMAL LOW (ref 32.0–36.0)
MCV: 89.5 fL (ref 79.3–98.0)
MONO#: 1 10*3/uL — AB (ref 0.1–0.9)
MONO%: 22.2 % — AB (ref 0.0–14.0)
NEUT#: 2.2 10*3/uL (ref 1.5–6.5)
NEUT%: 49.3 % (ref 39.0–75.0)
Platelets: 168 10*3/uL (ref 140–400)
RBC: 3.51 10*6/uL — ABNORMAL LOW (ref 4.20–5.82)
RDW: 21.1 % — AB (ref 11.0–14.6)
RETIC %: 2.61 % — AB (ref 0.80–1.80)
RETIC CT ABS: 91.61 10*3/uL (ref 34.80–93.90)
WBC: 4.4 10*3/uL (ref 4.0–10.3)
lymph#: 1 10*3/uL (ref 0.9–3.3)

## 2015-04-09 LAB — COMPREHENSIVE METABOLIC PANEL (CC13)
ALBUMIN: 3.3 g/dL — AB (ref 3.5–5.0)
ALT: 22 U/L (ref 0–55)
ANION GAP: 12 meq/L — AB (ref 3–11)
AST: 27 U/L (ref 5–34)
Alkaline Phosphatase: 53 U/L (ref 40–150)
BUN: 31.3 mg/dL — ABNORMAL HIGH (ref 7.0–26.0)
CHLORIDE: 109 meq/L (ref 98–109)
CO2: 20 meq/L — AB (ref 22–29)
Calcium: 8.3 mg/dL — ABNORMAL LOW (ref 8.4–10.4)
Creatinine: 1.2 mg/dL (ref 0.7–1.3)
EGFR: 54 mL/min/{1.73_m2} — ABNORMAL LOW (ref >90)
GLUCOSE: 108 mg/dL (ref 70–140)
POTASSIUM: 3.9 meq/L (ref 3.5–5.1)
SODIUM: 141 meq/L (ref 136–145)
Total Bilirubin: 0.46 mg/dL (ref 0.20–1.20)
Total Protein: 5.7 g/dL — ABNORMAL LOW (ref 6.4–8.3)

## 2015-04-09 LAB — FERRITIN CHCC: Ferritin: 156 ng/ml (ref 22–316)

## 2015-04-09 MED ORDER — DARBEPOETIN ALFA 500 MCG/ML IJ SOSY
500.0000 ug | PREFILLED_SYRINGE | Freq: Once | INTRAMUSCULAR | Status: AC
  Administered 2015-04-09: 500 ug via SUBCUTANEOUS
  Filled 2015-04-09: qty 1

## 2015-04-09 MED ORDER — SODIUM CHLORIDE 0.9 % IV SOLN
300.0000 mg/m2 | Freq: Once | INTRAVENOUS | Status: AC
  Administered 2015-04-09: 620 mg via INTRAVENOUS
  Filled 2015-04-09: qty 31

## 2015-04-09 MED ORDER — SODIUM CHLORIDE 0.9 % IV SOLN
Freq: Once | INTRAVENOUS | Status: AC
  Administered 2015-04-09: 11:00:00 via INTRAVENOUS

## 2015-04-09 MED ORDER — SODIUM CHLORIDE 0.9 % IV SOLN
Freq: Once | INTRAVENOUS | Status: AC
  Administered 2015-04-09: 12:00:00 via INTRAVENOUS
  Filled 2015-04-09: qty 4

## 2015-04-09 NOTE — Telephone Encounter (Signed)
Patients wife stopped  By to check on chemo orders for every other week,orders were in and schedule was made and avs pritned

## 2015-04-09 NOTE — Patient Instructions (Signed)
[facility] Discharge Instructions for Patients Receiving Chemotherapy  Today you received the following chemotherapy agents Cytoxan  To help prevent nausea and vomiting after your treatment, we encourage you to take your nausea medication as directed  If you develop nausea and vomiting that is not controlled by your nausea medication, call the clinic.   BELOW ARE SYMPTOMS THAT SHOULD BE REPORTED IMMEDIATELY:  *FEVER GREATER THAN 100.5 F  *CHILLS WITH OR WITHOUT FEVER  NAUSEA AND VOMITING THAT IS NOT CONTROLLED WITH YOUR NAUSEA MEDICATION  *UNUSUAL SHORTNESS OF BREATH  *UNUSUAL BRUISING OR BLEEDING  TENDERNESS IN MOUTH AND THROAT WITH OR WITHOUT PRESENCE OF ULCERS  *URINARY PROBLEMS  *BOWEL PROBLEMS  UNUSUAL RASH Items with * indicate a potential emergency and should be followed up as soon as possible.  Feel free to call the clinic you have any questions or concerns. The clinic phone number is [phone].  Please show the CHEMO ALERT CARD at check-in to the Emergency Department and triage nurse.   

## 2015-04-12 LAB — KAPPA/LAMBDA LIGHT CHAINS
Kappa free light chain: 17.3 mg/dL — ABNORMAL HIGH (ref 0.33–1.94)
Kappa:Lambda Ratio: 36.81 — ABNORMAL HIGH (ref 0.26–1.65)
LAMBDA FREE LGHT CHN: 0.47 mg/dL — AB (ref 0.57–2.63)

## 2015-04-12 NOTE — Progress Notes (Signed)
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  His left hallux and second toe have previously been amputated due to non healing ulcerations.  He had a small pressure area on this toe that went on to heal at the last visit but is now open and draining.  He also has an open ulcer submet five of the left foot as well  Objective:  Vascular status unchanged with decreased pulses that are non palpable as he is followed by Dr. Gwenlyn Found from previous stenting.  Full thickness ulcer down to exposed bone on the left third toe.  Probing to bone is noted.  No pus or pirulence is expressed.  No cellulitus within the toe is present.  Full thickness ulcer submet 5 is also present.  It measures 1cm x 1cm x .3cm .  Red granular base is noted.  No draining or pus is present.  No undermining, no probing to bone is present.  Non blanchable redness is present at the tip of the left fourth toe as well.    Assessment:  Ulcer left third toe- exposed bone; ulcer submet 5 left foot  Plan:  Debridement of necotic tissue and exposed bone is performed left third toe. iodosorb and a dry sterile dressing is applied.  Necrotic tissue is debrided from the left foot submet 5. iodosorb and a dry sterile dressing applied.  Patient given instructions for aftercare and dressing changes.  He will also wear his surgical shoe at all times when up on the foot.  i will see him back in a week for recheck.  If any increased redness, swelling or pus or pirulence arise he will call.

## 2015-04-14 ENCOUNTER — Encounter: Payer: Self-pay | Admitting: Podiatrist

## 2015-04-14 ENCOUNTER — Ambulatory Visit (INDEPENDENT_AMBULATORY_CARE_PROVIDER_SITE_OTHER): Payer: Medicare Other | Admitting: Podiatrist

## 2015-04-14 ENCOUNTER — Telehealth: Payer: Self-pay | Admitting: *Deleted

## 2015-04-14 VITALS — BP 123/64 | HR 58 | Temp 96.7°F | Resp 12

## 2015-04-14 DIAGNOSIS — L98494 Non-pressure chronic ulcer of skin of other sites with necrosis of bone: Secondary | ICD-10-CM

## 2015-04-14 DIAGNOSIS — I739 Peripheral vascular disease, unspecified: Secondary | ICD-10-CM

## 2015-04-14 DIAGNOSIS — L89891 Pressure ulcer of other site, stage 1: Secondary | ICD-10-CM

## 2015-04-14 NOTE — Progress Notes (Signed)
Chief Complaint  Patient presents with  . Foot Ulcer    "It's touchy, but the whole foot's touchy due to the neuropathy."  Pt's wife states the wound to the bottom of the left foot likes better, but the toe is still draining. 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  His left hallux and second toe have previously been amputated due to non healing ulcerations.  He relates some drainage from the toe since last visit.    He also has an open ulcer submet five of the left foot as well  Objective:  Vascular status unchanged with decreased pulses that are non palpable as he is followed by Dr. Gwenlyn Found from previous stenting.  Full thickness ulcer down to exposed bone on the left third toe.  Probing to bone is still noted but improved from last visit.  No pus or pirulence is expressed.  No cellulitus within the toe is present.  Full thickness ulcer submet 5 is also present.  It measures 1cm x 1cm x .3cm .  Red granular base is noted and appears to be filling in from deep to superficial.  No draining or pus is present.  No undermining, no probing to bone is present.  Non blanchable redness is present at the tip of the left fourth toe as well.    Assessment:  Ulcer left third toe-probably osteomyelitis distal phalanx; ulcer submet 5 left foot, pvd  Plan:  Debridement of necotic tissue is performed left third toe. iodosorb and a dry sterile dressing is applied.  Necrotic tissue is debrided from the left foot submet 5. iodosorb and a dry sterile dressing applied.  Patient given instructions for aftercare and dressing changes. I will write for him to obtain iodosorb through [facility] [employer].   He will continue to wear his open toe shoe at all times when up on the foot. He will be seen back in 2 weeks for a recheck with Dr. Jacqualyn Posey-- if any increased redness, swelling or drainage occur, he is to call immediately or  go to the er.

## 2015-04-14 NOTE — Patient Instructions (Signed)
    [employer] is a mail order medical supply company that we use to provide some of our would care products.  If we use their service of you, you will receive the product by mail.  If you have not received the medication in 3 business days, please call our office.  If you notice any foul odor, increase in pain, pus, increased swelling, red streaks or generalized redness occurring in your foot or leg-Call our office immediately to be seen.  This may be a sign of a limb or life threatening infection that will need prompt attention.

## 2015-04-14 NOTE — Telephone Encounter (Signed)
Rx for Iodosorb gel faxed.

## 2015-04-23 ENCOUNTER — Ambulatory Visit (HOSPITAL_BASED_OUTPATIENT_CLINIC_OR_DEPARTMENT_OTHER): Payer: Medicare Other

## 2015-04-23 ENCOUNTER — Other Ambulatory Visit (HOSPITAL_BASED_OUTPATIENT_CLINIC_OR_DEPARTMENT_OTHER): Payer: Medicare Other

## 2015-04-23 ENCOUNTER — Other Ambulatory Visit: Payer: Self-pay | Admitting: Cardiology

## 2015-04-23 VITALS — BP 146/57 | HR 74 | Temp 97.0°F | Resp 16

## 2015-04-23 DIAGNOSIS — L98499 Non-pressure chronic ulcer of skin of other sites with unspecified severity: Principal | ICD-10-CM

## 2015-04-23 DIAGNOSIS — C9000 Multiple myeloma not having achieved remission: Secondary | ICD-10-CM | POA: Diagnosis present

## 2015-04-23 DIAGNOSIS — M869 Osteomyelitis, unspecified: Secondary | ICD-10-CM

## 2015-04-23 DIAGNOSIS — Z5111 Encounter for antineoplastic chemotherapy: Secondary | ICD-10-CM | POA: Diagnosis present

## 2015-04-23 DIAGNOSIS — I739 Peripheral vascular disease, unspecified: Secondary | ICD-10-CM

## 2015-04-23 DIAGNOSIS — I70209 Unspecified atherosclerosis of native arteries of extremities, unspecified extremity: Secondary | ICD-10-CM

## 2015-04-23 DIAGNOSIS — N289 Disorder of kidney and ureter, unspecified: Secondary | ICD-10-CM

## 2015-04-23 LAB — COMPREHENSIVE METABOLIC PANEL (CC13)
ALT: 22 U/L (ref 0–55)
AST: 28 U/L (ref 5–34)
Albumin: 3.3 g/dL — ABNORMAL LOW (ref 3.5–5.0)
Alkaline Phosphatase: 49 U/L (ref 40–150)
Anion Gap: 8 mEq/L (ref 3–11)
BILIRUBIN TOTAL: 0.55 mg/dL (ref 0.20–1.20)
BUN: 34.3 mg/dL — AB (ref 7.0–26.0)
CO2: 22 meq/L (ref 22–29)
Calcium: 8.6 mg/dL (ref 8.4–10.4)
Chloride: 111 mEq/L — ABNORMAL HIGH (ref 98–109)
Creatinine: 1.3 mg/dL (ref 0.7–1.3)
EGFR: 52 mL/min/{1.73_m2} — ABNORMAL LOW (ref >90)
Glucose: 103 mg/dl (ref 70–140)
Potassium: 4 mEq/L (ref 3.5–5.1)
Sodium: 142 mEq/L (ref 136–145)
TOTAL PROTEIN: 5.5 g/dL — AB (ref 6.4–8.3)

## 2015-04-23 LAB — CBC WITH DIFFERENTIAL/PLATELET
BASO%: 1.6 % (ref 0.0–2.0)
Basophils Absolute: 0.1 10*3/uL (ref 0.0–0.1)
EOS ABS: 0.1 10*3/uL (ref 0.0–0.5)
EOS%: 2.4 % (ref 0.0–7.0)
HCT: 31.5 % — ABNORMAL LOW (ref 38.4–49.9)
HGB: 10.2 g/dL — ABNORMAL LOW (ref 13.0–17.1)
LYMPH#: 1.5 10*3/uL (ref 0.9–3.3)
LYMPH%: 26.3 % (ref 14.0–49.0)
MCH: 28.1 pg (ref 27.2–33.4)
MCHC: 32.2 g/dL (ref 32.0–36.0)
MCV: 87.2 fL (ref 79.3–98.0)
MONO#: 1 10*3/uL — AB (ref 0.1–0.9)
MONO%: 18.9 % — ABNORMAL HIGH (ref 0.0–14.0)
NEUT#: 2.8 10*3/uL (ref 1.5–6.5)
NEUT%: 50.8 % (ref 39.0–75.0)
PLATELETS: 179 10*3/uL (ref 140–400)
RBC: 3.62 10*6/uL — ABNORMAL LOW (ref 4.20–5.82)
RDW: 22.3 % — AB (ref 11.0–14.6)
WBC: 5.5 10*3/uL (ref 4.0–10.3)

## 2015-04-23 LAB — TECHNOLOGIST REVIEW

## 2015-04-23 MED ORDER — SODIUM CHLORIDE 0.9 % IV SOLN
300.0000 mg/m2 | Freq: Once | INTRAVENOUS | Status: AC
  Administered 2015-04-23: 620 mg via INTRAVENOUS
  Filled 2015-04-23: qty 31

## 2015-04-23 MED ORDER — SODIUM CHLORIDE 0.9 % IV SOLN
Freq: Once | INTRAVENOUS | Status: AC
  Administered 2015-04-23: 11:00:00 via INTRAVENOUS

## 2015-04-23 MED ORDER — SODIUM CHLORIDE 0.9 % IV SOLN
Freq: Once | INTRAVENOUS | Status: AC
  Administered 2015-04-23: 12:00:00 via INTRAVENOUS
  Filled 2015-04-23: qty 4

## 2015-04-23 NOTE — Patient Instructions (Signed)
[facility] Discharge Instructions for Patients Receiving Chemotherapy  Today you received the following chemotherapy agents Cytoxan  To help prevent nausea and vomiting after your treatment, we encourage you to take your nausea medication as directed  If you develop nausea and vomiting that is not controlled by your nausea medication, call the clinic.   BELOW ARE SYMPTOMS THAT SHOULD BE REPORTED IMMEDIATELY:  *FEVER GREATER THAN 100.5 F  *CHILLS WITH OR WITHOUT FEVER  NAUSEA AND VOMITING THAT IS NOT CONTROLLED WITH YOUR NAUSEA MEDICATION  *UNUSUAL SHORTNESS OF BREATH  *UNUSUAL BRUISING OR BLEEDING  TENDERNESS IN MOUTH AND THROAT WITH OR WITHOUT PRESENCE OF ULCERS  *URINARY PROBLEMS  *BOWEL PROBLEMS  UNUSUAL RASH Items with * indicate a potential emergency and should be followed up as soon as possible.  Feel free to call the clinic you have any questions or concerns. The clinic phone number is [phone].  Please show the CHEMO ALERT CARD at check-in to the Emergency Department and triage nurse.   

## 2015-04-23 NOTE — Telephone Encounter (Signed)
Rx has been sent to the pharmacy electronically. ° °

## 2015-04-25 ENCOUNTER — Other Ambulatory Visit: Payer: Self-pay | Admitting: Oncology

## 2015-04-28 ENCOUNTER — Telehealth: Payer: Self-pay | Admitting: *Deleted

## 2015-04-28 LAB — PROTEIN ELECTROPHORESIS, SERUM
ALBUMIN ELP: 3.4 g/dL — AB (ref 3.8–4.8)
ALPHA-1-GLOBULIN: 0.3 g/dL (ref 0.2–0.3)
Alpha-2-Globulin: 0.8 g/dL (ref 0.5–0.9)
Beta 2: 0.3 g/dL (ref 0.2–0.5)
Beta Globulin: 0.4 g/dL (ref 0.4–0.6)
Gamma Globulin: 0.6 g/dL — ABNORMAL LOW (ref 0.8–1.7)
Total Protein, Serum Electrophoresis: 5.7 g/dL — ABNORMAL LOW (ref 6.1–8.1)

## 2015-04-28 NOTE — Telephone Encounter (Signed)
Pt's wife, [name] states they have not received a call from the company re: Iodosorb gel from 04/14/2015 office visit.  I called [employer] and gave the pt's home phone and requested they call the pt and they agreed.  I also gave the [employer] [phone] to [facility].

## 2015-05-03 ENCOUNTER — Ambulatory Visit: Payer: Medicare Other

## 2015-05-03 ENCOUNTER — Encounter: Payer: Self-pay | Admitting: Podiatry

## 2015-05-03 ENCOUNTER — Ambulatory Visit (INDEPENDENT_AMBULATORY_CARE_PROVIDER_SITE_OTHER): Payer: Medicare Other | Admitting: Podiatry

## 2015-05-03 VITALS — BP 131/82 | HR 69 | Resp 12

## 2015-05-03 DIAGNOSIS — M869 Osteomyelitis, unspecified: Secondary | ICD-10-CM | POA: Diagnosis not present

## 2015-05-03 DIAGNOSIS — R52 Pain, unspecified: Secondary | ICD-10-CM

## 2015-05-03 DIAGNOSIS — I255 Ischemic cardiomyopathy: Secondary | ICD-10-CM | POA: Diagnosis not present

## 2015-05-03 DIAGNOSIS — L89891 Pressure ulcer of other site, stage 1: Secondary | ICD-10-CM

## 2015-05-03 DIAGNOSIS — L97524 Non-pressure chronic ulcer of other part of left foot with necrosis of bone: Secondary | ICD-10-CM

## 2015-05-03 NOTE — Progress Notes (Signed)
Patient ID: Timothy [name], male   DOB: 30-Jul-1928, 81 y.o.   MRN: [id number]  Subjective: Patient presents for follow-up evaluation of ulceration distal left second digit. He states that he continues to change the wound dressing daily with iodosorb followed by dry sterile dressing. Also limits and the bone was debrided in the mornings presents today. He denies any redness or any purulent drainage. He doesn't area was bleeding subjective: Himself on Keflex 500 mg 3 times a day for which is not the last 7 days. Nails as well as a painful callus on the left foot of all the fifth toe joint. Denies any redness or drainage from the area. No other complaints at this time in no acute changes. Denies any systemic complaints as fevers, chills, nausea, vomiting.  Objective: AAO 3, NAD DP/PT pulses decreased, CRT less than 3 seconds except for the left third digit where which is ulceration present Protective sensation decreased with Derrel Nip monofilament The distal aspects of the left third digit there is ulceration with exposed bone present. Hyperkeratotic periwound. There is no surrounding erythema, ascending sailors, drainage/purulence, fluctuance, crepitus, malodor. Hyperkeratotic lesion left foot submetatarsal 5. Upon debridement lesion there is a small ulceration measuring approximately 0.7 x 0.3 cm the superficial granular wound bed. There is no surrounding erythema, ascending cellulitis. fluctuance, crepitance, malodor. No open lesions or pre-ulcer lesions identified bilaterally. There is no pain with calf compression, swelling, warmth, erythema.  Assessment: 81 year old male left third digit ulceration with osteomyelitis, second metatarsal 5 ulceration  Plan: -X-rays were obtained and reviewed with the patient.  -Treatment options discussed including all alternatives, risks, and complications -Ulceration distal third digit was sharply debrided without complications. There was exposed distal  phalanx. A roungeur was utilized to debride some of the bone that was exposed. The bone was soft. Upon debridement, there was good bleeding to the digit. No purulence was expressed -Ulceration submetatarsal 5 sharp debrided to healthy, bleeding, granular wound base -Continue Iodosorb dressing changes daily to both wounds. -Monitor signs or symptoms of infection. Call the office immediately should any current. Discussed likelihood of amputation of third digit however patient wishes to hold off at this time understanding is risks of doing so. -Recommended the patient to follow up with vascular surgery and/or Dr. Alvester Chou for follow-up evaluation due to nonhealing ulceration -Follow-up in 3 weeks or sooner if any problems are to arise. In the meantime I encouraged him call the office with any questions, concerns, change in symptoms.

## 2015-05-06 ENCOUNTER — Telehealth (HOSPITAL_COMMUNITY): Payer: Self-pay | Admitting: *Deleted

## 2015-05-06 DIAGNOSIS — I739 Peripheral vascular disease, unspecified: Secondary | ICD-10-CM

## 2015-05-06 NOTE — Telephone Encounter (Signed)
Yes, we can proceed with a lower extremity arterial doppler.  There is actually an order already in the system from 05/2014.  I will place a cupid order.  Please have patient follow up with Dr Gwenlyn Found after the doppler.

## 2015-05-06 NOTE — Telephone Encounter (Signed)
Pts wife would like to know if Dr. Gwenlyn Found would order a doppler to check pts circulation. Pt is experiencing some issues with his middle toe on the left foot. Its currently open and you can see the bone.

## 2015-05-07 ENCOUNTER — Ambulatory Visit (HOSPITAL_BASED_OUTPATIENT_CLINIC_OR_DEPARTMENT_OTHER): Payer: Medicare Other

## 2015-05-07 ENCOUNTER — Other Ambulatory Visit: Payer: Medicare Other

## 2015-05-07 ENCOUNTER — Other Ambulatory Visit (HOSPITAL_BASED_OUTPATIENT_CLINIC_OR_DEPARTMENT_OTHER): Payer: Medicare Other

## 2015-05-07 VITALS — BP 136/63 | HR 67 | Temp 97.1°F | Resp 18

## 2015-05-07 DIAGNOSIS — C9000 Multiple myeloma not having achieved remission: Secondary | ICD-10-CM

## 2015-05-07 DIAGNOSIS — I70209 Unspecified atherosclerosis of native arteries of extremities, unspecified extremity: Secondary | ICD-10-CM

## 2015-05-07 DIAGNOSIS — D630 Anemia in neoplastic disease: Secondary | ICD-10-CM

## 2015-05-07 DIAGNOSIS — M869 Osteomyelitis, unspecified: Secondary | ICD-10-CM

## 2015-05-07 DIAGNOSIS — I739 Peripheral vascular disease, unspecified: Secondary | ICD-10-CM

## 2015-05-07 DIAGNOSIS — Z5111 Encounter for antineoplastic chemotherapy: Secondary | ICD-10-CM

## 2015-05-07 DIAGNOSIS — L98499 Non-pressure chronic ulcer of skin of other sites with unspecified severity: Principal | ICD-10-CM

## 2015-05-07 LAB — COMPREHENSIVE METABOLIC PANEL (CC13)
ALT: 26 U/L (ref 0–55)
ANION GAP: 7 meq/L (ref 3–11)
AST: 29 U/L (ref 5–34)
Albumin: 3.3 g/dL — ABNORMAL LOW (ref 3.5–5.0)
Alkaline Phosphatase: 46 U/L (ref 40–150)
BUN: 34.9 mg/dL — AB (ref 7.0–26.0)
CALCIUM: 8.8 mg/dL (ref 8.4–10.4)
CHLORIDE: 108 meq/L (ref 98–109)
CO2: 25 meq/L (ref 22–29)
Creatinine: 1.3 mg/dL (ref 0.7–1.3)
EGFR: 52 mL/min/{1.73_m2} — ABNORMAL LOW (ref >90)
Glucose: 111 mg/dl (ref 70–140)
Potassium: 4.1 mEq/L (ref 3.5–5.1)
Sodium: 140 mEq/L (ref 136–145)
Total Bilirubin: 0.5 mg/dL (ref 0.20–1.20)
Total Protein: 5.6 g/dL — ABNORMAL LOW (ref 6.4–8.3)

## 2015-05-07 LAB — CBC & DIFF AND RETIC
BASO%: 2.2 % — ABNORMAL HIGH (ref 0.0–2.0)
Basophils Absolute: 0.1 10*3/uL (ref 0.0–0.1)
EOS%: 2.7 % (ref 0.0–7.0)
Eosinophils Absolute: 0.1 10*3/uL (ref 0.0–0.5)
HEMATOCRIT: 29.6 % — AB (ref 38.4–49.9)
HGB: 9.4 g/dL — ABNORMAL LOW (ref 13.0–17.1)
Immature Retic Fract: 11.6 % — ABNORMAL HIGH (ref 3.00–10.60)
LYMPH%: 27.5 % (ref 14.0–49.0)
MCH: 28.1 pg (ref 27.2–33.4)
MCHC: 31.8 g/dL — ABNORMAL LOW (ref 32.0–36.0)
MCV: 88.6 fL (ref 79.3–98.0)
MONO#: 0.8 10*3/uL (ref 0.1–0.9)
MONO%: 19.3 % — ABNORMAL HIGH (ref 0.0–14.0)
NEUT#: 2 10*3/uL (ref 1.5–6.5)
NEUT%: 48.3 % (ref 39.0–75.0)
PLATELETS: 163 10*3/uL (ref 140–400)
RBC: 3.34 10*6/uL — ABNORMAL LOW (ref 4.20–5.82)
RDW: 21.8 % — ABNORMAL HIGH (ref 11.0–14.6)
Retic %: 1.75 % (ref 0.80–1.80)
Retic Ct Abs: 58.45 10*3/uL (ref 34.80–93.90)
WBC: 4.2 10*3/uL (ref 4.0–10.3)
lymph#: 1.1 10*3/uL (ref 0.9–3.3)

## 2015-05-07 LAB — TECHNOLOGIST REVIEW

## 2015-05-07 LAB — FERRITIN CHCC: Ferritin: 241 ng/ml (ref 22–316)

## 2015-05-07 MED ORDER — SODIUM CHLORIDE 0.9 % IV SOLN
300.0000 mg/m2 | Freq: Once | INTRAVENOUS | Status: AC
  Administered 2015-05-07: 620 mg via INTRAVENOUS
  Filled 2015-05-07: qty 31

## 2015-05-07 MED ORDER — DARBEPOETIN ALFA 500 MCG/ML IJ SOSY
500.0000 ug | PREFILLED_SYRINGE | Freq: Once | INTRAMUSCULAR | Status: AC
  Administered 2015-05-07: 500 ug via SUBCUTANEOUS
  Filled 2015-05-07: qty 1

## 2015-05-07 MED ORDER — SODIUM CHLORIDE 0.9 % IV SOLN
Freq: Once | INTRAVENOUS | Status: AC
  Administered 2015-05-07: 12:00:00 via INTRAVENOUS

## 2015-05-07 MED ORDER — SODIUM CHLORIDE 0.9 % IV SOLN
Freq: Once | INTRAVENOUS | Status: AC
  Administered 2015-05-07: 12:00:00 via INTRAVENOUS
  Filled 2015-05-07: qty 4

## 2015-05-07 NOTE — Patient Instructions (Signed)
[facility] Discharge Instructions for Patients Receiving Chemotherapy  Today you received the following chemotherapy agent: Cytoxan   To help prevent nausea and vomiting after your treatment, we encourage you to take your nausea medication as prescribed.    If you develop nausea and vomiting that is not controlled by your nausea medication, call the clinic.   BELOW ARE SYMPTOMS THAT SHOULD BE REPORTED IMMEDIATELY:  *FEVER GREATER THAN 100.5 F  *CHILLS WITH OR WITHOUT FEVER  NAUSEA AND VOMITING THAT IS NOT CONTROLLED WITH YOUR NAUSEA MEDICATION  *UNUSUAL SHORTNESS OF BREATH  *UNUSUAL BRUISING OR BLEEDING  TENDERNESS IN MOUTH AND THROAT WITH OR WITHOUT PRESENCE OF ULCERS  *URINARY PROBLEMS  *BOWEL PROBLEMS  UNUSUAL RASH Items with * indicate a potential emergency and should be followed up as soon as possible.  Feel free to call the clinic you have any questions or concerns. The clinic phone number is [phone].  Please show the [facility] at check-in to the Emergency Department and triage nurse.  Darbepoetin Alfa injection What is this medicine? DARBEPOETIN ALFA (dar be POE e tin AL fa) helps your body make more red blood cells. It is used to treat anemia caused by chronic kidney failure and chemotherapy. This medicine may be used for other purposes; ask your health care provider or pharmacist if you have questions. COMMON BRAND NAME(S): Aranesp What should I tell my health care provider before I take this medicine? They need to know if you have any of these conditions: -blood clotting disorders or history of blood clots -cancer patient not on chemotherapy -cystic fibrosis -heart disease, such as angina, heart failure, or a history of a heart attack -hemoglobin level of 12 g/dL or greater -high blood pressure -low levels of folate, iron, or vitamin B12 -seizures -an unusual or allergic reaction to darbepoetin, erythropoietin, albumin, hamster  proteins, latex, other medicines, foods, dyes, or preservatives -pregnant or trying to get pregnant -breast-feeding How should I use this medicine? This medicine is for injection into a vein or under the skin. It is usually given by a health care professional in a hospital or clinic setting. If you get this medicine at home, you will be taught how to prepare and give this medicine. Do not shake the solution before you withdraw a dose. Use exactly as directed. Take your medicine at regular intervals. Do not take your medicine more often than directed. It is important that you put your used needles and syringes in a special sharps container. Do not put them in a trash can. If you do not have a sharps container, call your pharmacist or healthcare provider to get one. Talk to your pediatrician regarding the use of this medicine in children. While this medicine may be used in children as young as 1 year for selected conditions, precautions do apply. Overdosage: If you think you have taken too much of this medicine contact a poison control center or emergency room at once. NOTE: This medicine is only for you. Do not share this medicine with others. What if I miss a dose? If you miss a dose, take it as soon as you can. If it is almost time for your next dose, take only that dose. Do not take double or extra doses. What may interact with this medicine? Do not take this medicine with any of the following medications: -epoetin alfa This list may not describe all possible interactions. Give your health care provider a list of all the medicines,  herbs, non-prescription drugs, or dietary supplements you use. Also tell them if you smoke, drink alcohol, or use illegal drugs. Some items may interact with your medicine. What should I watch for while using this medicine? Visit your prescriber or health care professional for regular checks on your progress and for the needed blood tests and blood pressure  measurements. It is especially important for the doctor to make sure your hemoglobin level is in the desired range, to limit the risk of potential side effects and to give you the best benefit. Keep all appointments for any recommended tests. Check your blood pressure as directed. Ask your doctor what your blood pressure should be and when you should contact him or her. As your body makes more red blood cells, you may need to take iron, folic acid, or vitamin B supplements. Ask your doctor or health care provider which products are right for you. If you have kidney disease continue dietary restrictions, even though this medication can make you feel better. Talk with your doctor or health care professional about the foods you eat and the vitamins that you take. What side effects may I notice from receiving this medicine? Side effects that you should report to your doctor or health care professional as soon as possible: -allergic reactions like skin rash, itching or hives, swelling of the face, lips, or tongue -breathing problems -changes in vision -chest pain -confusion, trouble speaking or understanding -feeling faint or lightheaded, falls -high blood pressure -muscle aches or pains -pain, swelling, warmth in the leg -rapid weight gain -severe headaches -sudden numbness or weakness of the face, arm or leg -trouble walking, dizziness, loss of balance or coordination -seizures (convulsions) -swelling of the ankles, feet, hands -unusually weak or tired Side effects that usually do not require medical attention (report to your doctor or health care professional if they continue or are bothersome): -diarrhea -fever, chills (flu-like symptoms) -headaches -nausea, vomiting -redness, stinging, or swelling at site where injected This list may not describe all possible side effects. Call your doctor for medical advice about side effects. You may report side effects to FDA at 1-800-FDA-1088. Where  should I keep my medicine? Keep out of the reach of children. Store in a refrigerator between 2 and 8 degrees C (36 and 46 degrees F). Do not freeze. Do not shake. Throw away any unused portion if using a single-dose vial. Throw away any unused medicine after the expiration date. NOTE: This sheet is a summary. It may not cover all possible information. If you have questions about this medicine, talk to your doctor, pharmacist, or health care provider.  2015, Elsevier/Gold Standard. (2008-10-20 10:23:57)

## 2015-05-10 LAB — KAPPA/LAMBDA LIGHT CHAINS
KAPPA FREE LGHT CHN: 45.3 mg/dL — AB (ref 0.33–1.94)
Kappa:Lambda Ratio: 33.07 — ABNORMAL HIGH (ref 0.26–1.65)
LAMBDA FREE LGHT CHN: 1.37 mg/dL (ref 0.57–2.63)

## 2015-05-10 NOTE — Progress Notes (Signed)
Dr Valentina Lucks ordered Iodosorb gel for ulcer with low drainage code 707.15 ulcer, 443.9 PVD, measurement 1 cm x 1 cm x 13mm depth sub 5th left, from [facility] faxed [phone].

## 2015-05-14 ENCOUNTER — Ambulatory Visit (HOSPITAL_COMMUNITY)
Admission: RE | Admit: 2015-05-14 | Discharge: 2015-05-14 | Disposition: A | Discharge status: Home / Self Care | Payer: Medicare Other | Source: Ambulatory Visit | Attending: Cardiovascular Disease | Admitting: Cardiovascular Disease

## 2015-05-14 DIAGNOSIS — I739 Peripheral vascular disease, unspecified: Secondary | ICD-10-CM | POA: Diagnosis present

## 2015-05-17 ENCOUNTER — Telehealth: Payer: Self-pay

## 2015-05-17 ENCOUNTER — Encounter: Payer: Self-pay | Admitting: Podiatry

## 2015-05-17 ENCOUNTER — Ambulatory Visit (INDEPENDENT_AMBULATORY_CARE_PROVIDER_SITE_OTHER): Payer: Medicare Other | Admitting: Podiatry

## 2015-05-17 VITALS — BP 118/66 | HR 75 | Resp 12

## 2015-05-17 DIAGNOSIS — M869 Osteomyelitis, unspecified: Secondary | ICD-10-CM | POA: Diagnosis not present

## 2015-05-17 DIAGNOSIS — L89891 Pressure ulcer of other site, stage 1: Secondary | ICD-10-CM

## 2015-05-17 DIAGNOSIS — L97524 Non-pressure chronic ulcer of other part of left foot with necrosis of bone: Secondary | ICD-10-CM

## 2015-05-17 DIAGNOSIS — I255 Ischemic cardiomyopathy: Secondary | ICD-10-CM

## 2015-05-17 NOTE — Patient Instructions (Signed)
Continue daily dressing changes. Monitor for any signs/symptoms of infection. Call the office immediately if any occur or go directly to the emergency room. Call with any questions/concerns.  

## 2015-05-17 NOTE — Telephone Encounter (Signed)
Called patient to notify him of appointment with Dr Gwenlyn Found to go over LEA results.

## 2015-05-18 ENCOUNTER — Encounter: Payer: Self-pay | Admitting: Podiatry

## 2015-05-18 NOTE — Progress Notes (Signed)
Patient ID: Timothy [name], male   DOB: July 24, 1928, 81 y.o.   MRN: [id number]  Subjective: Timothy [name] presents the office they for follow up evaluation of nonhealing ulcerations of the left third digit with osteomyelitis. He states that he continue the daily dressing changes with Iodosorb followed by dry sterile dressing. He denies any drainage or any purulence from the area and he denies any redness or any red streaking. He is finished her course of Keflex for which he put himself on before last appointment. ng. At this time he elected discussed possible surgical intervention to remove the end of the third toe of the left foot. No other complaints at this time in no acute changes since last appointment. He denies any systemic complaints such as fevers, chills, nausea, vomiting.   He previously underwent a left common femoral artery endarterectomy with vein patch angioplasty on 03/24/2014.  Objective: AAO x3, NAD DP/PT pulses decreased bilaterally Protective sensation decreased with Sims once the monofilament At the distal aspect of the left third digit there is ulceration which continues which measures approximately 0.3 x 0.3 cm in the wound probes directly down to bone, which is also visualized. This has remained unchanged apparently over last several visits. There is no surrounding erythema, ascending cellulitis, fluctuance, crepitus, malodor, drainage/purulence. There is a hyperkeratotic lesion left foot submetatarsal 5. Upon debridement there is no underlying ulceration or drainage at this time. There is no swelling erythema. Underlying hammertoe contractures. Previous hallux indication. No other open lesions or pre-ulcer lesions identified bilaterally. There is no pain with calf compression, swelling, warmth, erythema.  Assessment: 81 year old male left third digit osteomyelitis with nonhealing ulceration  Plan: -Treatment options discussed including all alternatives, risks, and  consultations.  -Wound sharply debrided without complications. Iodosorb was applied to the third toe ulceration followed by a bandage. Continue this at home. Pre-ulcerative callus submetatarsal 5 left was also debrided without complications. The wound was offloaded with pads.  -Discussed surgical intervention to remove portion of the left third toe. However given his vascular status would like to discuss this with Dr. Scot Dock and/or Dr. Alvester Chou  as he has seen both of them. He recently had new vascular studies which revealed  the left CIA debridement showed a minimal flow suggestive of possible occlusive disease in the left anterior tibial artery appeared occluded. ABI in the left side was 0.57 on the right 0.81. -Follow-up in 10 days or sooner should any problems arise. In the meantime encouraged to call the office with any questions, concerns, change in symptoms. -Monitor for any clinical signs or symptoms of infection and directed to call the office immediately should any occur or go to the ER.  Celesta Gentile, DPM

## 2015-05-19 ENCOUNTER — Ambulatory Visit: Payer: Medicare Other | Admitting: Cardiovascular Disease

## 2015-05-19 ENCOUNTER — Other Ambulatory Visit: Payer: Self-pay | Admitting: *Deleted

## 2015-05-19 ENCOUNTER — Encounter: Payer: Self-pay | Admitting: Cardiovascular Disease

## 2015-05-19 ENCOUNTER — Ambulatory Visit (INDEPENDENT_AMBULATORY_CARE_PROVIDER_SITE_OTHER): Payer: Medicare Other | Admitting: Cardiovascular Disease

## 2015-05-19 ENCOUNTER — Telehealth: Payer: Self-pay

## 2015-05-19 VITALS — BP 124/62 | HR 82 | Ht 70.0 in | Wt 185.6 lb

## 2015-05-19 DIAGNOSIS — I255 Ischemic cardiomyopathy: Secondary | ICD-10-CM

## 2015-05-19 DIAGNOSIS — D630 Anemia in neoplastic disease: Secondary | ICD-10-CM

## 2015-05-19 DIAGNOSIS — I739 Peripheral vascular disease, unspecified: Secondary | ICD-10-CM

## 2015-05-19 DIAGNOSIS — I70209 Unspecified atherosclerosis of native arteries of extremities, unspecified extremity: Secondary | ICD-10-CM

## 2015-05-19 DIAGNOSIS — Z01818 Encounter for other preprocedural examination: Secondary | ICD-10-CM | POA: Diagnosis not present

## 2015-05-19 DIAGNOSIS — I5023 Acute on chronic systolic (congestive) heart failure: Secondary | ICD-10-CM

## 2015-05-19 DIAGNOSIS — N289 Disorder of kidney and ureter, unspecified: Secondary | ICD-10-CM

## 2015-05-19 DIAGNOSIS — R5383 Other fatigue: Secondary | ICD-10-CM | POA: Diagnosis not present

## 2015-05-19 DIAGNOSIS — D689 Coagulation defect, unspecified: Secondary | ICD-10-CM

## 2015-05-19 DIAGNOSIS — D638 Anemia in other chronic diseases classified elsewhere: Secondary | ICD-10-CM

## 2015-05-19 DIAGNOSIS — M869 Osteomyelitis, unspecified: Secondary | ICD-10-CM

## 2015-05-19 DIAGNOSIS — L98499 Non-pressure chronic ulcer of skin of other sites with unspecified severity: Secondary | ICD-10-CM

## 2015-05-19 NOTE — Progress Notes (Signed)
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 for peripheral vascular evaluation because of lifestyle limiting claudication and poorly healing ulcers on his feet. His podiatrist is [name] and his oncologist [name]. He has a history of hypertension and hyperlipidemia as well as family history of heart disease. He had coronary artery bypass grafting x4 by [name] in 2002. A Myoview stress test on January of last year showed scar without ischemia and a 2-D echo revealed an ejection fraction of 35-40%. He denies chest pain or shortness of breath. He also has multiple myeloma which has been quiet and since February 2013 when he received his last chemotherapy treatment. He complains of Left lower extremity lifestyle limiting claudication. He has a slowly healing ulcer on his left heel as well. He has had amputation of his left great toe and a portion of his left second toe. I performed a peripheral angiography 03/22/14 revealing moderate iliac disease, high-grade calcified distal left common femoral artery disease with occluded profunda femoris, moderately calcified segmental mid left SFA disease with two-vessel runoff. The patient ultimately underwent left common femoral artery endarterectomy and patch angioplasty by [name] with excellent angiographic and ultrasound as well as clinical result. His ulcer ultimately healed. He has been seeing [name] for a nonhealing ulcer on his left second toe. Dopplers performed on 05/14/15 revealed a decline in his ejection fraction from 0.71-0.57 with what appears to be an occluded left common iliac artery.   Current Outpatient Prescriptions  Medication Sig Dispense Refill  . acyclovir  (ZOVIRAX) 400 MG tablet Take 1 tablet (400 mg total) by mouth 2 (two) times daily. 60 tablet 3  . allopurinol (ZYLOPRIM) 300 MG tablet Take 0.5 tablets (150 mg total) by mouth daily. 90 tablet 3  . aspirin EC 81 MG tablet Take 81 mg by mouth 3 (three) times a week. Monday, Wednesday, Friday    . atorvastatin (LIPITOR) 10 MG tablet Take 1 tablet (10 mg total) by mouth every evening. 90 tablet 3  . B Complex-Biotin-FA (B COMPLETE PO) Take 1 tablet by mouth daily.     . cadexomer iodine (IODOSORB) 0.9 % gel Apply 1 application topically daily.    . carvedilol (COREG) 6.25 MG tablet Take 1 tablet (6.25 mg total) by mouth 2 (two) times daily with a meal. 180 tablet 3  . Coenzyme Q10 (CO Q-10 PO) Take 1 tablet by mouth every morning.     . darbepoetin (ARANESP) 200 MCG/0.4ML SOLN injection Inject 200 mcg into the skin as directed.    Marland Kitchen dexamethasone (DECADRON) 4 MG tablet TAKE 5 TABLETS TWO CONSECUTIVE DAYS EVERY WEEK 40 tablet 5  . diphenoxylate-atropine (LOMOTIL) 2.5-0.025 MG per tablet Take 1 tablet by mouth 4 (four) times daily as needed for diarrhea or loose stools. 30 tablet 0  . furosemide (LASIX) 40 MG tablet Take 1 tablet (40 mg total) by mouth daily. 90 tablet 1  . HYDROmorphone (DILAUDID) 4 MG tablet Take 1 tablet (4 mg total) by mouth every 4 (four) hours as needed for severe pain. 120 tablet 0  . isosorbide mononitrate (IMDUR) 30 MG 24 hr tablet Take 1 tablet (30 mg total) by mouth daily. 90 tablet 3  . lisinopril (PRINIVIL,ZESTRIL) 20 MG tablet  TAKE 1 TABLET DAILY 90 tablet 2  . Multiple Vitamin (MULTIVITAMIN) capsule Take 1 capsule by mouth every morning.     . nitroGLYCERIN (NITROSTAT) 0.4 MG SL tablet Place 1 tablet (0.4 mg total) under the tongue every 5 (five) minutes as needed for chest pain. 25 tablet 3  . ondansetron (ZOFRAN) 8 MG tablet Take 1 tablet (8 mg total) by mouth 2 (two) times daily as needed (Nausea or vomiting). 30 tablet 1  . polyethylene glycol (MIRALAX / GLYCOLAX)  packet Take 17 g by mouth daily as needed. 14 each 0  . rOPINIRole (REQUIP) 3 MG tablet Take 1 tablet (3 mg total) by mouth at bedtime. 90 tablet 4  . silver sulfADIAZINE (SILVADENE) 1 % cream Apply 1 application topically daily. 50 g 0  . tamsulosin (FLOMAX) 0.4 MG CAPS capsule Take 0.4 mg by mouth at bedtime.    Marland Kitchen tobramycin (TOBREX) 0.3 % ophthalmic solution Place 1 drop into both eyes daily as needed.  4  . vitamin C (ASCORBIC ACID) 500 MG tablet Take 500 mg by mouth every morning.     No current facility-administered medications for this visit.    Allergies  Allergen Reactions  . Morphine And Related Other (See Comments)    Agitated with morphine drip    History   Social History  . Marital Status: Married    Spouse Name: N/A  . Number of Children: N/A  . Years of Education: N/A   Occupational History  . Not on file.   Social History Main Topics  . Smoking status: Former Smoker -- 1.00 packs/day for 20 years    Types: Cigarettes    Quit date: 11/20/1984  . Smokeless tobacco: Never Used  . Alcohol Use: No  . Drug Use: No  . Sexual Activity: Not Currently   Other Topics Concern  . Not on file   Social History Narrative   Lives at home with wife, has children but not with current wife. Still active, golfing and walking in the yard before the issue with the wound, now using a walker.      Review of Systems: General: negative for chills, fever, night sweats or weight changes.  Cardiovascular: negative for chest pain, dyspnea on exertion, edema, orthopnea, palpitations, paroxysmal nocturnal dyspnea or shortness of breath Dermatological: negative for rash Respiratory: negative for cough or wheezing Urologic: negative for hematuria Abdominal: negative for nausea, vomiting, diarrhea, bright red blood per rectum, melena, or hematemesis Neurologic: negative for visual changes, syncope, or dizziness All other systems reviewed and are otherwise negative except as noted  above.    Blood pressure 124/62, pulse 82, height $RemoveBe'5\' 10"'[id number]$  (1.778 m), weight 185 lb 9.6 oz (84.188 kg).  General appearance: alert and no distress Neck: no adenopathy, no carotid bruit, no JVD, supple, symmetrical, trachea midline and thyroid not enlarged, symmetric, no tenderness/mass/nodules Lungs: clear to auscultation bilaterally Heart: regular rate and rhythm, S1, S2 normal, no murmur, click, rub or gallop Extremities: absent left great toe status post amputation, left second toe ischemic-appearing ulcer  EKG not performed today  ASSESSMENT AND PLAN:   Atherosclerotic PVD with ulceration [name] has critical limb ischemia. I angiogram to him 03/22/14 revealing a 30-40% left common iliac artery stenosis, 95% eccentric calcified left common femoral artery stenosis, 50-75% calcified mid left SFA stenosis with single-vessel runoff. He underwent left common femoral endarterectomy with patch angioplasty of the following day by Dr. Scot Dock with an excellent angiographic and clinical result. His wound subsequently healed.  He now has a nonhealing left second toe ulcer. His most recent Dopplers performed a week ago revealed what appears to be an occluded left common iliac artery with an ABI that has decreased from 0.71 year ago .57. I'm going to arrange for him to undergo angiography and potential re-intervention for critical limb ischemia.      Lorretta Harp MD FACP,FACC,FAHA, [facility] 05/19/2015 11:27 AM

## 2015-05-19 NOTE — Assessment & Plan Note (Signed)
Mr. Timothy [name] has critical limb ischemia. I angiogram to him 03/22/14 revealing a 30-40% left common iliac artery stenosis, 95% eccentric calcified left common femoral artery stenosis, 50-75% calcified mid left SFA stenosis with single-vessel runoff. He underwent left common femoral endarterectomy with patch angioplasty of the following day by Dr. Scot Dock with an excellent angiographic and clinical result. His wound subsequently healed. He now has a nonhealing left second toe ulcer. His most recent Dopplers performed a week ago revealed what appears to be an occluded left common iliac artery with an ABI that has decreased from 0.71 year ago .57. I'm going to arrange for him to undergo angiography and potential re-intervention for critical limb ischemia.

## 2015-05-19 NOTE — Patient Instructions (Addendum)
Dr. Gwenlyn Found has ordered a peripheral angiogram to be done at [facility] next thursday.  This procedure is going to look at the bloodflow in your lower extremities.  If Dr. Gwenlyn Found is able to open up the arteries, you will have to spend one night in the hospital.  If he is not able to open the arteries, you will be able to go home that same day.   After the procedure, you will not be allowed to drive for 3 days or push, pull, or lift anything greater than 10 lbs for one week.    You will be required to have the following tests prior to the procedure: 1. Blood work-the blood work can be done no more than 7 days prior to the procedure.  Please show the lab slip to the lab technician at the [facility]. 2. Chest Xray-the chest xray order has already been placed at [place]   right groin access

## 2015-05-19 NOTE — Telephone Encounter (Signed)
Patient called back today after office visit with concerns. Patient will be undergoing PV Angiogram scheduled for 05/27/15. Patient is concerned about his kidney function and Dr Gwenlyn Found having to use dye during the procedure. I let patient know that the blood work ordered today would let us know his kidney function and based on those numbers, it would tell us whether he would need to be admitted to the hospital prior to the procedure for hydration.

## 2015-05-21 ENCOUNTER — Ambulatory Visit (HOSPITAL_COMMUNITY)
Admission: RE | Admit: 2015-05-21 | Discharge: 2015-05-21 | Disposition: A | Discharge status: Home / Self Care | Payer: Medicare Other | Source: Ambulatory Visit | Attending: Cardiovascular Disease | Admitting: Cardiovascular Disease

## 2015-05-21 ENCOUNTER — Other Ambulatory Visit (HOSPITAL_BASED_OUTPATIENT_CLINIC_OR_DEPARTMENT_OTHER): Payer: Medicare Other

## 2015-05-21 ENCOUNTER — Other Ambulatory Visit: Payer: Self-pay | Admitting: *Deleted

## 2015-05-21 ENCOUNTER — Ambulatory Visit (HOSPITAL_BASED_OUTPATIENT_CLINIC_OR_DEPARTMENT_OTHER): Payer: Medicare Other

## 2015-05-21 VITALS — BP 123/56 | HR 65 | Temp 98.1°F | Resp 18

## 2015-05-21 DIAGNOSIS — I255 Ischemic cardiomyopathy: Secondary | ICD-10-CM

## 2015-05-21 DIAGNOSIS — Z951 Presence of aortocoronary bypass graft: Secondary | ICD-10-CM | POA: Diagnosis not present

## 2015-05-21 DIAGNOSIS — C9000 Multiple myeloma not having achieved remission: Secondary | ICD-10-CM | POA: Diagnosis present

## 2015-05-21 DIAGNOSIS — Z87891 Personal history of nicotine dependence: Secondary | ICD-10-CM | POA: Insufficient documentation

## 2015-05-21 DIAGNOSIS — Z01818 Encounter for other preprocedural examination: Secondary | ICD-10-CM | POA: Insufficient documentation

## 2015-05-21 DIAGNOSIS — D62 Acute posthemorrhagic anemia: Secondary | ICD-10-CM

## 2015-05-21 DIAGNOSIS — D630 Anemia in neoplastic disease: Secondary | ICD-10-CM

## 2015-05-21 DIAGNOSIS — N289 Disorder of kidney and ureter, unspecified: Secondary | ICD-10-CM

## 2015-05-21 DIAGNOSIS — Z5111 Encounter for antineoplastic chemotherapy: Secondary | ICD-10-CM

## 2015-05-21 DIAGNOSIS — R0602 Shortness of breath: Secondary | ICD-10-CM | POA: Diagnosis not present

## 2015-05-21 DIAGNOSIS — I5023 Acute on chronic systolic (congestive) heart failure: Secondary | ICD-10-CM | POA: Diagnosis not present

## 2015-05-21 DIAGNOSIS — M869 Osteomyelitis, unspecified: Secondary | ICD-10-CM

## 2015-05-21 DIAGNOSIS — I739 Peripheral vascular disease, unspecified: Secondary | ICD-10-CM

## 2015-05-21 DIAGNOSIS — D638 Anemia in other chronic diseases classified elsewhere: Secondary | ICD-10-CM

## 2015-05-21 LAB — TSH CHCC: TSH: 5.772 m[IU]/L — AB (ref 0.320–4.118)

## 2015-05-21 LAB — CBC WITH DIFFERENTIAL/PLATELET
BASO%: 0.3 % (ref 0.0–2.0)
BASOS ABS: 0 10*3/uL (ref 0.0–0.1)
EOS ABS: 0.1 10*3/uL (ref 0.0–0.5)
EOS%: 2.6 % (ref 0.0–7.0)
HEMATOCRIT: 31.5 % — AB (ref 38.4–49.9)
HEMOGLOBIN: 10 g/dL — AB (ref 13.0–17.1)
LYMPH%: 21 % (ref 14.0–49.0)
MCH: 27.8 pg (ref 27.2–33.4)
MCHC: 31.7 g/dL — AB (ref 32.0–36.0)
MCV: 87.7 fL (ref 79.3–98.0)
MONO#: 0.8 10*3/uL (ref 0.1–0.9)
MONO%: 18.9 % — ABNORMAL HIGH (ref 0.0–14.0)
NEUT%: 57.2 % (ref 39.0–75.0)
NEUTROS ABS: 2.6 10*3/uL (ref 1.5–6.5)
Platelets: 173 10*3/uL (ref 140–400)
RBC: 3.59 10*6/uL — ABNORMAL LOW (ref 4.20–5.82)
RDW: 22.5 % — ABNORMAL HIGH (ref 11.0–14.6)
WBC: 4.5 10*3/uL (ref 4.0–10.3)
lymph#: 0.9 10*3/uL (ref 0.9–3.3)

## 2015-05-21 LAB — BASIC METABOLIC PANEL (CC13)
Anion Gap: 8 mEq/L (ref 3–11)
BUN: 33.6 mg/dL — AB (ref 7.0–26.0)
CO2: 22 mEq/L (ref 22–29)
CREATININE: 1.3 mg/dL (ref 0.7–1.3)
Calcium: 9 mg/dL (ref 8.4–10.4)
Chloride: 107 mEq/L (ref 98–109)
EGFR: 51 mL/min/{1.73_m2} — ABNORMAL LOW (ref >90)
Glucose: 113 mg/dl (ref 70–140)
POTASSIUM: 4.2 meq/L (ref 3.5–5.1)
SODIUM: 137 meq/L (ref 136–145)

## 2015-05-21 LAB — TECHNOLOGIST REVIEW

## 2015-05-21 LAB — CHCC SMEAR

## 2015-05-21 MED ORDER — SODIUM CHLORIDE 0.9 % IV SOLN
300.0000 mg/m2 | Freq: Once | INTRAVENOUS | Status: AC
  Administered 2015-05-21: 620 mg via INTRAVENOUS
  Filled 2015-05-21: qty 31

## 2015-05-21 MED ORDER — SODIUM CHLORIDE 0.9 % IV SOLN
3.0000 mg | Freq: Once | INTRAVENOUS | Status: AC
  Administered 2015-05-21: 3 mg via INTRAVENOUS
  Filled 2015-05-21: qty 3.75

## 2015-05-21 MED ORDER — DEXAMETHASONE SODIUM PHOSPHATE 100 MG/10ML IJ SOLN
Freq: Once | INTRAMUSCULAR | Status: AC
  Administered 2015-05-21: 13:00:00 via INTRAVENOUS
  Filled 2015-05-21: qty 4

## 2015-05-21 MED ORDER — SODIUM CHLORIDE 0.9 % IV SOLN
Freq: Once | INTRAVENOUS | Status: AC
  Administered 2015-05-21: 13:00:00 via INTRAVENOUS

## 2015-05-21 NOTE — Patient Instructions (Signed)

## 2015-05-22 LAB — PROTHROMBIN TIME
INR: 1.04 (ref <1.50)
Prothrombin Time: 13.6 seconds (ref 11.6–15.2)

## 2015-05-22 LAB — APTT: aPTT: 27 s (ref 24–37)

## 2015-05-27 ENCOUNTER — Telehealth: Payer: Self-pay | Admitting: *Deleted

## 2015-05-27 ENCOUNTER — Encounter (HOSPITAL_COMMUNITY)
Admission: RE | Disposition: A | Discharge status: Home / Self Care | Payer: Self-pay | Source: Ambulatory Visit | Attending: Cardiovascular Disease

## 2015-05-27 ENCOUNTER — Ambulatory Visit (HOSPITAL_COMMUNITY)
Admission: RE | Admit: 2015-05-27 | Discharge: 2015-05-27 | Disposition: A | Discharge status: Home / Self Care | Payer: Medicare Other | Source: Ambulatory Visit | Attending: Cardiovascular Disease | Admitting: Cardiovascular Disease

## 2015-05-27 DIAGNOSIS — Z87891 Personal history of nicotine dependence: Secondary | ICD-10-CM | POA: Insufficient documentation

## 2015-05-27 DIAGNOSIS — I70229 Atherosclerosis of native arteries of extremities with rest pain, unspecified extremity: Secondary | ICD-10-CM | POA: Diagnosis present

## 2015-05-27 DIAGNOSIS — I70245 Atherosclerosis of native arteries of left leg with ulceration of other part of foot: Secondary | ICD-10-CM | POA: Insufficient documentation

## 2015-05-27 DIAGNOSIS — Z9221 Personal history of antineoplastic chemotherapy: Secondary | ICD-10-CM | POA: Insufficient documentation

## 2015-05-27 DIAGNOSIS — Z7982 Long term (current) use of aspirin: Secondary | ICD-10-CM | POA: Insufficient documentation

## 2015-05-27 DIAGNOSIS — Z8249 Family history of ischemic heart disease and other diseases of the circulatory system: Secondary | ICD-10-CM | POA: Diagnosis not present

## 2015-05-27 DIAGNOSIS — I739 Peripheral vascular disease, unspecified: Secondary | ICD-10-CM

## 2015-05-27 DIAGNOSIS — I10 Essential (primary) hypertension: Secondary | ICD-10-CM | POA: Diagnosis not present

## 2015-05-27 DIAGNOSIS — L97529 Non-pressure chronic ulcer of other part of left foot with unspecified severity: Secondary | ICD-10-CM | POA: Insufficient documentation

## 2015-05-27 DIAGNOSIS — E785 Hyperlipidemia, unspecified: Secondary | ICD-10-CM | POA: Insufficient documentation

## 2015-05-27 DIAGNOSIS — Z951 Presence of aortocoronary bypass graft: Secondary | ICD-10-CM | POA: Diagnosis not present

## 2015-05-27 DIAGNOSIS — C9000 Multiple myeloma not having achieved remission: Secondary | ICD-10-CM | POA: Insufficient documentation

## 2015-05-27 DIAGNOSIS — Z89412 Acquired absence of left great toe: Secondary | ICD-10-CM | POA: Insufficient documentation

## 2015-05-27 DIAGNOSIS — I998 Other disorder of circulatory system: Secondary | ICD-10-CM | POA: Diagnosis present

## 2015-05-27 HISTORY — PX: PERIPHERAL VASCULAR CATHETERIZATION: SHX172C

## 2015-05-27 SURGERY — LOWER EXTREMITY ANGIOGRAPHY
Anesthesia: LOCAL

## 2015-05-27 MED ORDER — FENTANYL CITRATE (PF) 100 MCG/2ML IJ SOLN
INTRAMUSCULAR | Status: AC
  Filled 2015-05-27: qty 2

## 2015-05-27 MED ORDER — HEPARIN (PORCINE) IN NACL 2-0.9 UNIT/ML-% IJ SOLN
INTRAMUSCULAR | Status: AC
  Filled 2015-05-27: qty 1000

## 2015-05-27 MED ORDER — SODIUM CHLORIDE 0.9 % IV SOLN
INTRAVENOUS | Status: AC
Start: 2015-05-27 — End: 2015-05-27

## 2015-05-27 MED ORDER — ASPIRIN 81 MG PO CHEW
81.0000 mg | CHEWABLE_TABLET | ORAL | Status: AC
  Administered 2015-05-27: 81 mg via ORAL

## 2015-05-27 MED ORDER — MIDAZOLAM HCL 2 MG/2ML IJ SOLN
INTRAMUSCULAR | Status: AC
  Filled 2015-05-27: qty 2

## 2015-05-27 MED ORDER — FENTANYL CITRATE (PF) 100 MCG/2ML IJ SOLN
INTRAMUSCULAR | Status: DC | PRN
  Administered 2015-05-27: 25 ug via INTRAVENOUS

## 2015-05-27 MED ORDER — SODIUM CHLORIDE 0.9 % WEIGHT BASED INFUSION
3.0000 mL/kg/h | INTRAVENOUS | Status: DC
  Administered 2015-05-27: 3 mL/kg/h via INTRAVENOUS

## 2015-05-27 MED ORDER — LIDOCAINE HCL (PF) 1 % IJ SOLN
INTRAMUSCULAR | Status: AC
  Filled 2015-05-27: qty 30

## 2015-05-27 MED ORDER — ACETAMINOPHEN 325 MG PO TABS: 650.0000 mg | ORAL_TABLET | ORAL | Status: DC | PRN

## 2015-05-27 MED ORDER — ASPIRIN EC 325 MG PO TBEC: 325.0000 mg | DELAYED_RELEASE_TABLET | Freq: Every day | ORAL | Status: DC

## 2015-05-27 MED ORDER — SODIUM CHLORIDE 0.9 % IJ SOLN: 3.0000 mL | INTRAMUSCULAR | Status: DC | PRN

## 2015-05-27 MED ORDER — ASPIRIN 81 MG PO CHEW
CHEWABLE_TABLET | ORAL | Status: AC
  Administered 2015-05-27: 81 mg via ORAL
  Filled 2015-05-27: qty 1

## 2015-05-27 MED ORDER — MIDAZOLAM HCL 2 MG/2ML IJ SOLN
INTRAMUSCULAR | Status: DC | PRN
  Administered 2015-05-27: 1 mg via INTRAVENOUS

## 2015-05-27 MED ORDER — ONDANSETRON HCL 4 MG/2ML IJ SOLN: 4.0000 mg | Freq: Four times a day (QID) | INTRAMUSCULAR | Status: DC | PRN

## 2015-05-27 MED ORDER — SODIUM CHLORIDE 0.9 % WEIGHT BASED INFUSION: 1.0000 mL/kg/h | INTRAVENOUS | Status: DC

## 2015-05-27 SURGICAL SUPPLY — 9 items
CATH CROSS OVER TEMPO 5F (CATHETERS) ×2 IMPLANT
CATH INFINITI 5 FR STR PIGTAIL (CATHETERS) ×2 IMPLANT
KIT PV (KITS) ×2 IMPLANT
SET AVANTA SINGLE PATIENT (MISCELLANEOUS) ×2 IMPLANT
SHEATH AVANTA HAND CONTROLLER (MISCELLANEOUS) ×2 IMPLANT
SHEATH PINNACLE 5F 10CM (SHEATH) ×2 IMPLANT
TRANSDUCER W/STOPCOCK (MISCELLANEOUS) ×2 IMPLANT
TRAY PV CATH (CUSTOM PROCEDURE TRAY) ×2 IMPLANT
WIRE HITORQ VERSACORE ST 145CM (WIRE) ×2 IMPLANT

## 2015-05-27 NOTE — H&P (View-Only) (Signed)
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 for peripheral vascular evaluation because of lifestyle limiting claudication and poorly healing ulcers on his feet. His podiatrist is [name] and his oncologist [name]. He has a history of hypertension and hyperlipidemia as well as family history of heart disease. He had coronary artery bypass grafting x4 by [name] in 2002. A Myoview stress test on January of last year showed scar without ischemia and a 2-D echo revealed an ejection fraction of 35-40%. He denies chest pain or shortness of breath. He also has multiple myeloma which has been quiet and since February 2013 when he received his last chemotherapy treatment. He complains of Left lower extremity lifestyle limiting claudication. He has a slowly healing ulcer on his left heel as well. He has had amputation of his left great toe and a portion of his left second toe. I performed a peripheral angiography 03/22/14 revealing moderate iliac disease, high-grade calcified distal left common femoral artery disease with occluded profunda femoris, moderately calcified segmental mid left SFA disease with two-vessel runoff. The patient ultimately underwent left common femoral artery endarterectomy and patch angioplasty by [name] with excellent angiographic and ultrasound as well as clinical result. His ulcer ultimately healed. He has been seeing [name] for a nonhealing ulcer on his left second toe. Dopplers performed on 05/14/15 revealed a decline in his ejection fraction from 0.71-0.57 with what appears to be an occluded left common iliac artery.   Current Outpatient Prescriptions  Medication Sig Dispense Refill  . acyclovir  (ZOVIRAX) 400 MG tablet Take 1 tablet (400 mg total) by mouth 2 (two) times daily. 60 tablet 3  . allopurinol (ZYLOPRIM) 300 MG tablet Take 0.5 tablets (150 mg total) by mouth daily. 90 tablet 3  . aspirin EC 81 MG tablet Take 81 mg by mouth 3 (three) times a week. Monday, Wednesday, Friday    . atorvastatin (LIPITOR) 10 MG tablet Take 1 tablet (10 mg total) by mouth every evening. 90 tablet 3  . B Complex-Biotin-FA (B COMPLETE PO) Take 1 tablet by mouth daily.     . cadexomer iodine (IODOSORB) 0.9 % gel Apply 1 application topically daily.    . carvedilol (COREG) 6.25 MG tablet Take 1 tablet (6.25 mg total) by mouth 2 (two) times daily with a meal. 180 tablet 3  . Coenzyme Q10 (CO Q-10 PO) Take 1 tablet by mouth every morning.     . darbepoetin (ARANESP) 200 MCG/0.4ML SOLN injection Inject 200 mcg into the skin as directed.    Marland Kitchen dexamethasone (DECADRON) 4 MG tablet TAKE 5 TABLETS TWO CONSECUTIVE DAYS EVERY WEEK 40 tablet 5  . diphenoxylate-atropine (LOMOTIL) 2.5-0.025 MG per tablet Take 1 tablet by mouth 4 (four) times daily as needed for diarrhea or loose stools. 30 tablet 0  . furosemide (LASIX) 40 MG tablet Take 1 tablet (40 mg total) by mouth daily. 90 tablet 1  . HYDROmorphone (DILAUDID) 4 MG tablet Take 1 tablet (4 mg total) by mouth every 4 (four) hours as needed for severe pain. 120 tablet 0  . isosorbide mononitrate (IMDUR) 30 MG 24 hr tablet Take 1 tablet (30 mg total) by mouth daily. 90 tablet 3  . lisinopril (PRINIVIL,ZESTRIL) 20 MG tablet  TAKE 1 TABLET DAILY 90 tablet 2  . Multiple Vitamin (MULTIVITAMIN) capsule Take 1 capsule by mouth every morning.     . nitroGLYCERIN (NITROSTAT) 0.4 MG SL tablet Place 1 tablet (0.4 mg total) under the tongue every 5 (five) minutes as needed for chest pain. 25 tablet 3  . ondansetron (ZOFRAN) 8 MG tablet Take 1 tablet (8 mg total) by mouth 2 (two) times daily as needed (Nausea or vomiting). 30 tablet 1  . polyethylene glycol (MIRALAX / GLYCOLAX)  packet Take 17 g by mouth daily as needed. 14 each 0  . rOPINIRole (REQUIP) 3 MG tablet Take 1 tablet (3 mg total) by mouth at bedtime. 90 tablet 4  . silver sulfADIAZINE (SILVADENE) 1 % cream Apply 1 application topically daily. 50 g 0  . tamsulosin (FLOMAX) 0.4 MG CAPS capsule Take 0.4 mg by mouth at bedtime.    Marland Kitchen tobramycin (TOBREX) 0.3 % ophthalmic solution Place 1 drop into both eyes daily as needed.  4  . vitamin C (ASCORBIC ACID) 500 MG tablet Take 500 mg by mouth every morning.     No current facility-administered medications for this visit.    Allergies  Allergen Reactions  . Morphine And Related Other (See Comments)    Agitated with morphine drip    History   Social History  . Marital Status: Married    Spouse Name: N/A  . Number of Children: N/A  . Years of Education: N/A   Occupational History  . Not on file.   Social History Main Topics  . Smoking status: Former Smoker -- 1.00 packs/day for 20 years    Types: Cigarettes    Quit date: 11/20/1984  . Smokeless tobacco: Never Used  . Alcohol Use: No  . Drug Use: No  . Sexual Activity: Not Currently   Other Topics Concern  . Not on file   Social History Narrative   Lives at home with wife, has children but not with current wife. Still active, golfing and walking in the yard before the issue with the wound, now using a walker.      Review of Systems: General: negative for chills, fever, night sweats or weight changes.  Cardiovascular: negative for chest pain, dyspnea on exertion, edema, orthopnea, palpitations, paroxysmal nocturnal dyspnea or shortness of breath Dermatological: negative for rash Respiratory: negative for cough or wheezing Urologic: negative for hematuria Abdominal: negative for nausea, vomiting, diarrhea, bright red blood per rectum, melena, or hematemesis Neurologic: negative for visual changes, syncope, or dizziness All other systems reviewed and are otherwise negative except as noted  above.    Blood pressure 124/62, pulse 82, height $RemoveBe'5\' 10"'[id number]$  (1.778 m), weight 185 lb 9.6 oz (84.188 kg).  General appearance: alert and no distress Neck: no adenopathy, no carotid bruit, no JVD, supple, symmetrical, trachea midline and thyroid not enlarged, symmetric, no tenderness/mass/nodules Lungs: clear to auscultation bilaterally Heart: regular rate and rhythm, S1, S2 normal, no murmur, click, rub or gallop Extremities: absent left great toe status post amputation, left second toe ischemic-appearing ulcer  EKG not performed today  ASSESSMENT AND PLAN:   Atherosclerotic PVD with ulceration [name] has critical limb ischemia. I angiogram to him 03/22/14 revealing a 30-40% left common iliac artery stenosis, 95% eccentric calcified left common femoral artery stenosis, 50-75% calcified mid left SFA stenosis with single-vessel runoff. He underwent left common femoral endarterectomy with patch angioplasty of the following day by Dr. Scot Dock with an excellent angiographic and clinical result. His wound subsequently healed.  He now has a nonhealing left second toe ulcer. His most recent Dopplers performed a week ago revealed what appears to be an occluded left common iliac artery with an ABI that has decreased from 0.71 year ago .57. I'm going to arrange for him to undergo angiography and potential re-intervention for critical limb ischemia.      Lorretta Harp MD FACP,FACC,FAHA, [facility] 05/19/2015 11:27 AM

## 2015-05-27 NOTE — Discharge Instructions (Signed)

## 2015-05-27 NOTE — Telephone Encounter (Signed)
Message received from Dr Gwenlyn Found that Timothy [name] needs to be seen in the office next week and set up for another pv angio next week. The patient needs diamondback atherectomyto LCIA.

## 2015-05-27 NOTE — Interval H&P Note (Signed)
History and Physical Interval Note:  05/27/2015 12:36 PM  Timothy [name]  has presented today for surgery, with the diagnosis of claudication  The various methods of treatment have been discussed with the patient and family. After consideration of risks, benefits and other options for treatment, the patient has consented to  Procedure(s): Lower Extremity Angiography (N/A) as a surgical intervention .  The patient's history has been reviewed, patient examined, no change in status, stable for surgery.  I have reviewed the patient's chart and labs.  Questions were answered to the patient's satisfaction.     Quay Burow

## 2015-05-28 ENCOUNTER — Encounter (HOSPITAL_COMMUNITY): Payer: Self-pay | Admitting: Cardiovascular Disease

## 2015-05-28 ENCOUNTER — Encounter: Payer: Self-pay | Admitting: Cardiovascular Disease

## 2015-05-28 ENCOUNTER — Telehealth: Payer: Self-pay | Admitting: *Deleted

## 2015-05-28 NOTE — Telephone Encounter (Signed)
PT. IS FOR VASCULAR SURGERY ON HIS LEFT LEG ON 06/03/15. PT.'S WIFE WOULD LIKE TO CANCEL HER HUSBAND'S LAB AND INFUSION APPOINTMENTS ON 06/04/15. PT. WILL KEEP APPOINTMENT WITH DR.MAGRINAT ON 06/08/15. PT. WOULD LIKE DR.MAGRINAT TO CALL HIM ON HIS CELL PHONE NUMBER [phone] SO PT. COULD TALK WITH DR.MAGRINAT ABOUT THE VASCULAR SURGERY AND A POSSIBLE PORT A CATH PLACEMENT.

## 2015-05-28 NOTE — Telephone Encounter (Signed)
Spoke with patient's wife and schedule the appt for next week with JB.  Patient is also scheduled for daimondback procedure next week.

## 2015-05-28 NOTE — Telephone Encounter (Signed)
Returning your call from yesterday. °

## 2015-05-31 ENCOUNTER — Ambulatory Visit: Payer: Medicare Other | Admitting: Podiatry

## 2015-05-31 MED FILL — Heparin Sodium (Porcine) 2 Unit/ML in Sodium Chloride 0.9%: INTRAMUSCULAR | Qty: 1000 | Status: AC

## 2015-05-31 MED FILL — Lidocaine HCl Local Preservative Free (PF) Inj 1%: INTRAMUSCULAR | Qty: 30 | Status: AC

## 2015-06-01 ENCOUNTER — Encounter: Payer: Self-pay | Admitting: Cardiovascular Disease

## 2015-06-01 ENCOUNTER — Ambulatory Visit (INDEPENDENT_AMBULATORY_CARE_PROVIDER_SITE_OTHER): Payer: Medicare Other | Admitting: Cardiovascular Disease

## 2015-06-01 ENCOUNTER — Other Ambulatory Visit: Payer: Self-pay | Admitting: Oncology

## 2015-06-01 VITALS — BP 142/64 | HR 76 | Ht 70.0 in | Wt 183.9 lb

## 2015-06-01 DIAGNOSIS — L98499 Non-pressure chronic ulcer of skin of other sites with unspecified severity: Secondary | ICD-10-CM | POA: Diagnosis not present

## 2015-06-01 DIAGNOSIS — I739 Peripheral vascular disease, unspecified: Secondary | ICD-10-CM | POA: Diagnosis not present

## 2015-06-01 DIAGNOSIS — I70209 Unspecified atherosclerosis of native arteries of extremities, unspecified extremity: Secondary | ICD-10-CM

## 2015-06-01 DIAGNOSIS — I255 Ischemic cardiomyopathy: Secondary | ICD-10-CM | POA: Diagnosis not present

## 2015-06-01 NOTE — Progress Notes (Signed)
Timothy [name] returns today for follow-up of his injury and performed last Thursday. This revealed a high-grade calcified left common iliac artery stenosis which will require diamondback orbital occasional atherectomy. This left common femoral endarterectomy site is widely patent. He scheduled to have this performed this coming Thursday.   Lorretta Harp, M.D., [place], [facility], [place] [address] [place],   [address]  [phone] 06/01/2015 11:31 AM

## 2015-06-01 NOTE — Assessment & Plan Note (Signed)
History of left common femoral endarterectomy with patch angioplasty performed by Dr. Scot Dock in May of last year. He had recurrent symptoms with a nonhealing ulcer on his left second toe at decline in his left ABI. Angiogram Chem-05/27/15 revealing a patent endarterectomy site with a 95% calcified lesion in his left common iliac artery which will require diamondback orbital rotational atherectomy scheduled for this coming Thursday.

## 2015-06-02 ENCOUNTER — Telehealth: Payer: Self-pay | Admitting: *Deleted

## 2015-06-02 NOTE — Telephone Encounter (Signed)
Patient's wife called and left message that they need to cancel appts for Friday. I have canceled appts

## 2015-06-03 ENCOUNTER — Encounter (HOSPITAL_COMMUNITY)
Admission: RE | Disposition: A | Discharge status: Home / Self Care | Payer: Self-pay | Source: Ambulatory Visit | Attending: Cardiovascular Disease

## 2015-06-03 ENCOUNTER — Encounter (HOSPITAL_COMMUNITY): Payer: Self-pay | Admitting: Cardiovascular Disease

## 2015-06-03 ENCOUNTER — Ambulatory Visit (HOSPITAL_COMMUNITY)
Admission: RE | Admit: 2015-06-03 | Discharge: 2015-06-04 | Disposition: A | Discharge status: Home / Self Care | Payer: Medicare Other | Source: Ambulatory Visit | Attending: Cardiovascular Disease | Admitting: Cardiovascular Disease

## 2015-06-03 DIAGNOSIS — I255 Ischemic cardiomyopathy: Secondary | ICD-10-CM | POA: Diagnosis present

## 2015-06-03 DIAGNOSIS — I5022 Chronic systolic (congestive) heart failure: Secondary | ICD-10-CM | POA: Diagnosis not present

## 2015-06-03 DIAGNOSIS — Z7982 Long term (current) use of aspirin: Secondary | ICD-10-CM | POA: Diagnosis not present

## 2015-06-03 DIAGNOSIS — C9000 Multiple myeloma not having achieved remission: Secondary | ICD-10-CM | POA: Insufficient documentation

## 2015-06-03 DIAGNOSIS — I70202 Unspecified atherosclerosis of native arteries of extremities, left leg: Secondary | ICD-10-CM | POA: Diagnosis present

## 2015-06-03 DIAGNOSIS — E785 Hyperlipidemia, unspecified: Secondary | ICD-10-CM | POA: Insufficient documentation

## 2015-06-03 DIAGNOSIS — D649 Anemia, unspecified: Secondary | ICD-10-CM | POA: Diagnosis present

## 2015-06-03 DIAGNOSIS — I70212 Atherosclerosis of native arteries of extremities with intermittent claudication, left leg: Secondary | ICD-10-CM | POA: Diagnosis not present

## 2015-06-03 DIAGNOSIS — Z951 Presence of aortocoronary bypass graft: Secondary | ICD-10-CM | POA: Diagnosis not present

## 2015-06-03 DIAGNOSIS — I70249 Atherosclerosis of native arteries of left leg with ulceration of unspecified site: Secondary | ICD-10-CM | POA: Insufficient documentation

## 2015-06-03 DIAGNOSIS — I70229 Atherosclerosis of native arteries of extremities with rest pain, unspecified extremity: Secondary | ICD-10-CM | POA: Diagnosis present

## 2015-06-03 DIAGNOSIS — I771 Stricture of artery: Secondary | ICD-10-CM | POA: Diagnosis not present

## 2015-06-03 DIAGNOSIS — I998 Other disorder of circulatory system: Secondary | ICD-10-CM | POA: Diagnosis present

## 2015-06-03 DIAGNOSIS — I10 Essential (primary) hypertension: Secondary | ICD-10-CM | POA: Diagnosis not present

## 2015-06-03 DIAGNOSIS — Z9221 Personal history of antineoplastic chemotherapy: Secondary | ICD-10-CM | POA: Diagnosis not present

## 2015-06-03 DIAGNOSIS — I70209 Unspecified atherosclerosis of native arteries of extremities, unspecified extremity: Secondary | ICD-10-CM | POA: Diagnosis present

## 2015-06-03 DIAGNOSIS — I2510 Atherosclerotic heart disease of native coronary artery without angina pectoris: Secondary | ICD-10-CM | POA: Diagnosis not present

## 2015-06-03 DIAGNOSIS — L97929 Non-pressure chronic ulcer of unspecified part of left lower leg with unspecified severity: Secondary | ICD-10-CM | POA: Diagnosis not present

## 2015-06-03 DIAGNOSIS — N289 Disorder of kidney and ureter, unspecified: Secondary | ICD-10-CM

## 2015-06-03 DIAGNOSIS — L98499 Non-pressure chronic ulcer of skin of other sites with unspecified severity: Secondary | ICD-10-CM

## 2015-06-03 HISTORY — DX: Low back pain: M54.5

## 2015-06-03 HISTORY — DX: Other pulmonary embolism without acute cor pulmonale: I26.99

## 2015-06-03 HISTORY — DX: Basal cell carcinoma of skin, unspecified: C44.91

## 2015-06-03 HISTORY — DX: Multiple myeloma not having achieved remission: C90.00

## 2015-06-03 HISTORY — DX: Low back pain, unspecified: M54.50

## 2015-06-03 HISTORY — PX: PERIPHERAL VASCULAR CATHETERIZATION: SHX172C

## 2015-06-03 HISTORY — DX: Other chronic pain: G89.29

## 2015-06-03 HISTORY — DX: Anemia, unspecified: D64.9

## 2015-06-03 LAB — CBC
HEMATOCRIT: 26.2 % — AB (ref 39.0–52.0)
HEMOGLOBIN: 8.4 g/dL — AB (ref 13.0–17.0)
MCH: 28.3 pg (ref 26.0–34.0)
MCHC: 32.1 g/dL (ref 30.0–36.0)
MCV: 88.2 fL (ref 78.0–100.0)
PLATELETS: 130 10*3/uL — AB (ref 150–400)
RBC: 2.97 MIL/uL — AB (ref 4.22–5.81)
RDW: 21.9 % — ABNORMAL HIGH (ref 11.5–15.5)
WBC: 4.2 10*3/uL (ref 4.0–10.5)

## 2015-06-03 LAB — CBC WITH DIFFERENTIAL/PLATELET
BAND NEUTROPHILS: 0 % (ref 0–10)
BLASTS: 0 %
Basophils Absolute: 0.1 10*3/uL (ref 0.0–0.1)
Basophils Relative: 2 % — ABNORMAL HIGH (ref 0–1)
Eosinophils Absolute: 0.2 10*3/uL (ref 0.0–0.7)
Eosinophils Relative: 3 % (ref 0–5)
HCT: 30 % — ABNORMAL LOW (ref 39.0–52.0)
HEMOGLOBIN: 9.6 g/dL — AB (ref 13.0–17.0)
Lymphocytes Relative: 22 % (ref 12–46)
Lymphs Abs: 1.1 10*3/uL (ref 0.7–4.0)
MCH: 28.7 pg (ref 26.0–34.0)
MCHC: 32 g/dL (ref 30.0–36.0)
MCV: 89.6 fL (ref 78.0–100.0)
METAMYELOCYTES PCT: 0 %
MONOS PCT: 8 % (ref 3–12)
Monocytes Absolute: 0.4 10*3/uL (ref 0.1–1.0)
Myelocytes: 0 %
Neutro Abs: 3.2 10*3/uL (ref 1.7–7.7)
Neutrophils Relative %: 65 % (ref 43–77)
Other: 0 %
PROMYELOCYTES ABS: 0 %
Platelets: 126 10*3/uL — ABNORMAL LOW (ref 150–400)
RBC: 3.35 MIL/uL — AB (ref 4.22–5.81)
RDW: 20.3 % — ABNORMAL HIGH (ref 11.5–15.5)
WBC: 5 10*3/uL (ref 4.0–10.5)
nRBC: 0 /100 WBC

## 2015-06-03 LAB — BASIC METABOLIC PANEL
ANION GAP: 9 (ref 5–15)
BUN: 34 mg/dL — AB (ref 6–20)
CHLORIDE: 107 mmol/L (ref 101–111)
CO2: 24 mmol/L (ref 22–32)
Calcium: 8.9 mg/dL (ref 8.9–10.3)
Creatinine, Ser: 1.33 mg/dL — ABNORMAL HIGH (ref 0.61–1.24)
GFR calc Af Amer: 55 mL/min — ABNORMAL LOW (ref >60)
GFR calc non Af Amer: 47 mL/min — ABNORMAL LOW (ref >60)
Glucose, Bld: 96 mg/dL (ref 65–99)
POTASSIUM: 3.9 mmol/L (ref 3.5–5.1)
Sodium: 140 mmol/L (ref 135–145)

## 2015-06-03 LAB — POCT ACTIVATED CLOTTING TIME
ACTIVATED CLOTTING TIME: 165 s
Activated Clotting Time: 196 seconds
Activated Clotting Time: 227 seconds
Activated Clotting Time: 251 seconds
Activated Clotting Time: 183 seconds
Activated Clotting Time: 171 seconds

## 2015-06-03 LAB — PROTIME-INR
INR: 1.08 (ref 0.00–1.49)
Prothrombin Time: 14.2 seconds (ref 11.6–15.2)

## 2015-06-03 LAB — PREPARE RBC (CROSSMATCH)

## 2015-06-03 SURGERY — ATHERECTOMY PERIPHERAL ARTERY

## 2015-06-03 MED ORDER — CARVEDILOL 3.125 MG PO TABS
6.2500 mg | ORAL_TABLET | Freq: Two times a day (BID) | ORAL | Status: DC
  Administered 2015-06-03 – 2015-06-04 (×2): 6.25 mg via ORAL
  Filled 2015-06-03 (×2): qty 2

## 2015-06-03 MED ORDER — FUROSEMIDE 40 MG PO TABS
40.0000 mg | ORAL_TABLET | Freq: Every day | ORAL | Status: DC
  Administered 2015-06-03 – 2015-06-04 (×2): 40 mg via ORAL
  Filled 2015-06-03 (×2): qty 1

## 2015-06-03 MED ORDER — ISOSORBIDE MONONITRATE ER 30 MG PO TB24
30.0000 mg | ORAL_TABLET | Freq: Every day | ORAL | Status: DC
  Administered 2015-06-03 – 2015-06-04 (×2): 30 mg via ORAL
  Filled 2015-06-03 (×2): qty 1

## 2015-06-03 MED ORDER — HEPARIN SODIUM (PORCINE) 1000 UNIT/ML IJ SOLN
INTRAMUSCULAR | Status: DC | PRN
  Administered 2015-06-03: 1500 [IU] via INTRAVENOUS
  Administered 2015-06-03 (×2): 5000 [IU] via INTRAVENOUS
  Administered 2015-06-03: 4000 [IU] via INTRAVENOUS

## 2015-06-03 MED ORDER — HEPARIN (PORCINE) IN NACL 2-0.9 UNIT/ML-% IJ SOLN
INTRAMUSCULAR | Status: AC
  Filled 2015-06-03: qty 1000

## 2015-06-03 MED ORDER — NITROGLYCERIN 0.4 MG SL SUBL: 0.4000 mg | SUBLINGUAL_TABLET | SUBLINGUAL | Status: DC | PRN

## 2015-06-03 MED ORDER — ASPIRIN EC 81 MG PO TBEC: 81.0000 mg | DELAYED_RELEASE_TABLET | ORAL | Status: DC

## 2015-06-03 MED ORDER — FENTANYL CITRATE (PF) 100 MCG/2ML IJ SOLN
INTRAMUSCULAR | Status: AC
  Filled 2015-06-03: qty 2

## 2015-06-03 MED ORDER — ONDANSETRON HCL 4 MG/2ML IJ SOLN: 4.0000 mg | Freq: Four times a day (QID) | INTRAMUSCULAR | Status: DC | PRN

## 2015-06-03 MED ORDER — MIDAZOLAM HCL 2 MG/2ML IJ SOLN
INTRAMUSCULAR | Status: AC
  Filled 2015-06-03: qty 2

## 2015-06-03 MED ORDER — SODIUM CHLORIDE 0.9 % IJ SOLN: 3.0000 mL | INTRAMUSCULAR | Status: DC | PRN

## 2015-06-03 MED ORDER — LORAZEPAM 2 MG/ML IJ SOLN
0.5000 mg | Freq: Once | INTRAMUSCULAR | Status: AC
  Administered 2015-06-03: 0.5 mg via INTRAVENOUS

## 2015-06-03 MED ORDER — VERAPAMIL HCL 2.5 MG/ML IV SOLN
INTRAVENOUS | Status: AC
  Filled 2015-06-03: qty 2

## 2015-06-03 MED ORDER — ACYCLOVIR 400 MG PO TABS
400.0000 mg | ORAL_TABLET | Freq: Two times a day (BID) | ORAL | Status: DC
  Administered 2015-06-03 – 2015-06-04 (×2): 400 mg via ORAL
  Filled 2015-06-03 (×5): qty 1

## 2015-06-03 MED ORDER — HEPARIN SODIUM (PORCINE) 1000 UNIT/ML IJ SOLN
INTRAMUSCULAR | Status: AC
  Filled 2015-06-03: qty 1

## 2015-06-03 MED ORDER — ASPIRIN 81 MG PO CHEW: 81.0000 mg | CHEWABLE_TABLET | ORAL | Status: DC

## 2015-06-03 MED ORDER — LISINOPRIL 10 MG PO TABS
20.0000 mg | ORAL_TABLET | Freq: Every day | ORAL | Status: DC
  Administered 2015-06-04: 09:00:00 20 mg via ORAL
  Filled 2015-06-03: qty 2

## 2015-06-03 MED ORDER — SODIUM CHLORIDE 0.9 % IV SOLN: INTRAVENOUS | Status: AC

## 2015-06-03 MED ORDER — HYDROMORPHONE HCL 2 MG PO TABS: 4.0000 mg | ORAL_TABLET | ORAL | Status: DC | PRN

## 2015-06-03 MED ORDER — DIPHENOXYLATE-ATROPINE 2.5-0.025 MG PO TABS: 1.0000 | ORAL_TABLET | Freq: Four times a day (QID) | ORAL | Status: DC | PRN

## 2015-06-03 MED ORDER — LIDOCAINE HCL (PF) 1 % IJ SOLN
INTRAMUSCULAR | Status: AC
  Filled 2015-06-03: qty 30

## 2015-06-03 MED ORDER — LORAZEPAM 2 MG/ML IJ SOLN
INTRAMUSCULAR | Status: AC
  Filled 2015-06-03: qty 1

## 2015-06-03 MED ORDER — NITROGLYCERIN IN D5W 200-5 MCG/ML-% IV SOLN
INTRAVENOUS | Status: AC
  Filled 2015-06-03: qty 250

## 2015-06-03 MED ORDER — HYDROMORPHONE HCL 1 MG/ML IJ SOLN: 1.0000 mg | Freq: Four times a day (QID) | INTRAMUSCULAR | Status: DC | PRN

## 2015-06-03 MED ORDER — ALLOPURINOL 150 MG HALF TABLET
150.0000 mg | ORAL_TABLET | Freq: Every day | ORAL | Status: DC
  Administered 2015-06-04: 10:00:00 150 mg via ORAL
  Filled 2015-06-03 (×2): qty 1

## 2015-06-03 MED ORDER — ATORVASTATIN CALCIUM 10 MG PO TABS
10.0000 mg | ORAL_TABLET | Freq: Every evening | ORAL | Status: DC
  Administered 2015-06-03: 23:00:00 10 mg via ORAL
  Filled 2015-06-03: qty 1

## 2015-06-03 MED ORDER — HYDROMORPHONE HCL 1 MG/ML IJ SOLN: 0.5000 mg | Freq: Four times a day (QID) | INTRAMUSCULAR | Status: DC | PRN

## 2015-06-03 MED ORDER — ASPIRIN EC 325 MG PO TBEC: 325.0000 mg | DELAYED_RELEASE_TABLET | Freq: Every day | ORAL | Status: DC

## 2015-06-03 MED ORDER — IODIXANOL 320 MG/ML IV SOLN
INTRAVENOUS | Status: DC | PRN
  Administered 2015-06-03: 131 mL via INTRA_ARTERIAL

## 2015-06-03 MED ORDER — CLOPIDOGREL BISULFATE 75 MG PO TABS
75.0000 mg | ORAL_TABLET | Freq: Every day | ORAL | Status: DC
  Administered 2015-06-04: 75 mg via ORAL
  Filled 2015-06-03: qty 1

## 2015-06-03 MED ORDER — FENTANYL CITRATE (PF) 100 MCG/2ML IJ SOLN
INTRAMUSCULAR | Status: DC | PRN
  Administered 2015-06-03 (×2): 25 ug via INTRAVENOUS

## 2015-06-03 MED ORDER — ONDANSETRON HCL 4 MG PO TABS: 8.0000 mg | ORAL_TABLET | Freq: Two times a day (BID) | ORAL | Status: DC | PRN

## 2015-06-03 MED ORDER — VITAMIN C 500 MG PO TABS
250.0000 mg | ORAL_TABLET | Freq: Two times a day (BID) | ORAL | Status: DC
  Administered 2015-06-04: 250 mg via ORAL
  Filled 2015-06-03: qty 1

## 2015-06-03 MED ORDER — LIDOCAINE HCL (PF) 1 % IJ SOLN
INTRAMUSCULAR | Status: DC | PRN
  Administered 2015-06-03: 15 mL

## 2015-06-03 MED ORDER — TAMSULOSIN HCL 0.4 MG PO CAPS
0.4000 mg | ORAL_CAPSULE | Freq: Every day | ORAL | Status: DC
  Administered 2015-06-03: 23:00:00 0.4 mg via ORAL
  Filled 2015-06-03: qty 1

## 2015-06-03 MED ORDER — HYDROMORPHONE HCL 1 MG/ML IJ SOLN
INTRAMUSCULAR | Status: AC
  Administered 2015-06-03: 16:00:00 0.5 mg
  Filled 2015-06-03: qty 1

## 2015-06-03 MED ORDER — CLOPIDOGREL BISULFATE 75 MG PO TABS
ORAL_TABLET | ORAL | Status: DC | PRN
  Administered 2015-06-03: 300 mg via ORAL

## 2015-06-03 MED ORDER — ACETAMINOPHEN 325 MG PO TABS: 650.0000 mg | ORAL_TABLET | ORAL | Status: DC | PRN

## 2015-06-03 MED ORDER — SODIUM CHLORIDE 0.9 % IV SOLN: 300.0000 mg/m2 | INTRAVENOUS | Status: DC

## 2015-06-03 MED ORDER — SODIUM CHLORIDE 0.9 % WEIGHT BASED INFUSION
3.0000 mL/kg/h | INTRAVENOUS | Status: DC
  Administered 2015-06-03: 3 mL/kg/h via INTRAVENOUS

## 2015-06-03 MED ORDER — MIDAZOLAM HCL 2 MG/2ML IJ SOLN
INTRAMUSCULAR | Status: DC | PRN
  Administered 2015-06-03: 1 mg via INTRAVENOUS

## 2015-06-03 MED ORDER — SODIUM CHLORIDE 0.9 % IV SOLN: Freq: Once | INTRAVENOUS | Status: DC

## 2015-06-03 MED ORDER — CLOPIDOGREL BISULFATE 300 MG PO TABS
ORAL_TABLET | ORAL | Status: AC
  Filled 2015-06-03: qty 1

## 2015-06-03 MED ORDER — SODIUM CHLORIDE 0.9 % WEIGHT BASED INFUSION: 1.0000 mL/kg/h | INTRAVENOUS | Status: DC

## 2015-06-03 SURGICAL SUPPLY — 26 items
BALLOON POWERFLX PRO 7X40X135 (BALLOONS) ×5 IMPLANT
CATH ANGIO 5F BER2 100CM (CATHETERS) ×5 IMPLANT
CATH ANGIO 5F PIGTAIL 100CM (CATHETERS) ×5 IMPLANT
CATH ANGIO 5F PIGTAIL 65CM (CATHETERS) ×5 IMPLANT
CATH QUICKCROSS SUPP .018X90CM (MICROCATHETER) ×5 IMPLANT
CATH SOFT-VU 4F 65 STRAIGHT (CATHETERS) ×3 IMPLANT
CATH SOFT-VU ST 4F 90CM (CATHETERS) ×5 IMPLANT
CATH SOFT-VU STRAIGHT 4F 65CM (CATHETERS) ×2
CROWN PREDATOR SOLID 2.25 (CATHETERS) ×5 IMPLANT
DIAMONDBACK SOLID OAS 1.5MM (CATHETERS) ×5
GUIDEWIRE REGALIA .014X300CM (WIRE) ×5 IMPLANT
HAND CONTROLLER AVANTA (MISCELLANEOUS) IMPLANT
KIT ENCORE 26 ADVANTAGE (KITS) ×5 IMPLANT
KIT PV (KITS) ×5 IMPLANT
LUBRICANT VIPERSLIDE CORONARY (MISCELLANEOUS) ×5 IMPLANT
SHEATH PINNACLE 7F 10CM (SHEATH) ×5 IMPLANT
SHEATH PINNACLE ST 7F 45CM (SHEATH) ×5 IMPLANT
STENT ICAST 9X38X120 (Permanent Stent) ×5 IMPLANT
SYR MEDRAD MARK V 150ML (SYRINGE) IMPLANT
SYSTEM DIMNDBCK SLD OAS 1.5MM (CATHETERS) ×3 IMPLANT
TAPE RADIOPAQUE TURBO (MISCELLANEOUS) ×5 IMPLANT
TRANSDUCER W/STOPCOCK (MISCELLANEOUS) ×5 IMPLANT
TRAY PV CATH (CUSTOM PROCEDURE TRAY) ×5 IMPLANT
WIRE HITORQ VERSACORE ST 145CM (WIRE) ×5 IMPLANT
WIRE VERSACORE LOC 115CM (WIRE) ×5 IMPLANT
WIRE VIPER ADVANCE .017X335CM (WIRE) ×5 IMPLANT

## 2015-06-03 NOTE — Progress Notes (Signed)
Pt has had difficulty with hemostasis now several hours post intervention. He has had at least 2 hours of groin holding with continued bleeding. I have seen pt and requested trhat a Fem=Stop be placed which has been done with hemostasis. Because of his relativelely low starting HGB and signif bleeding during the groin hold , I am going to Tx 2 units PRBCs. I have discussed the situation with Dr Gae Gallop who performed the LCFA endart/patch angioplasty a year ago. Dr. Bridgett Larsson is on call tonight for VVS in the event that the pt doesn't obtain hemostasis despite fem-stop and requires surgical hemostasis.   Lorretta Harp, M.D., [place], [facility], [place] [address] [place], [place]  [address]  [phone] 06/03/2015 4:47 PM

## 2015-06-03 NOTE — Progress Notes (Signed)
Femostop removed at [place].

## 2015-06-03 NOTE — Progress Notes (Signed)
  Femostop removed. L groin is stable. Pain controlled. Palpable distal pulses, good warmth and color. He is receiving blood transfusion. F/u H/H scheduled for 10 pm.   Timothy [name]

## 2015-06-03 NOTE — H&P (View-Only) (Signed)
Timothy [name] returns today for follow-up of his injury and performed last Thursday. This revealed a high-grade calcified left common iliac artery stenosis which will require diamondback orbital occasional atherectomy. This left common femoral endarterectomy site is widely patent. He scheduled to have this performed this coming Thursday.   Lorretta Harp, M.D., [place], [facility], [place] [address] [place], [place]  [address]  [phone] 06/01/2015 11:31 AM

## 2015-06-03 NOTE — Interval H&P Note (Signed)
History and Physical Interval Note:  06/03/2015 9:01 AM  Timothy [name]  has presented today for surgery, with the diagnosis of pvd  The various methods of treatment have been discussed with the patient and family. After consideration of risks, benefits and other options for treatment, the patient has consented to  Procedure(s): Lower Extremity Angiography (N/A) as a surgical intervention .  The patient's history has been reviewed, patient examined, no change in status, stable for surgery.  I have reviewed the patient's chart and labs.  Questions were answered to the patient's satisfaction.     Quay Burow

## 2015-06-03 NOTE — Progress Notes (Signed)
Patients sheath pulled at 1340 and pressure held. Unable to maintain pressure and stop bleeding. Pressure held without bleeding and then began to ooze pulsating slowly then saturated gauze. [place] came to relieve me and also was unable to maintain pressure without bleeding. Gina from cath lab came to assist and they where able to maintain pressure without bleeding but as soon as any pressure was let up the blood would spurt up. Patient given ativan to decrease anxiety and help during holding with good results, resting. Dr. Gwenlyn Found notified and continued to hold pressure as ordered. Patient given dilaudid IV for pain. Dr. Gwenlyn Found notified when bleeding continued after 2 hours of holding pressure. Femostop applied as ordered by Dr. Gwenlyn Found. Dr. Gwenlyn Found into see patient and reviewed plan of care with patient and wife.  Femostop on for 2 hours and pressure slowly released with no bleeding or hematoma noted. Femostop removed. No bleeding or hematoma noted. Area bruised and tender. Report to Donna Christen RN and site observed by Neldon Newport.

## 2015-06-03 NOTE — Progress Notes (Signed)
Was called to pull a pts sheath by Rn. Upon arrival on floor Timothy [name], nurse was in Timothy [name]'s room holding pressure. She explained to me she had been holding pressure since 1340, i arrived around 1415. I started holding pressure and realized I immediately needed back up, I couldn't seem to get the pt to stop bleeding. Another Cath lab tech arrived and we both held pressure til 1545. Dr Gwenlyn Found was called and notified of the situation. At this time he ordered to place a Femstop on the pt. With assistance from the RN a femstop was applied with an initial pressure of 180 after hemostasis was achieved the pressure was decreased to 155. Pt BP was 139/78, all other vitals were WNL. Dr. Gwenlyn Found then came to the bedside and stated to leave the femstop on at 117mmhg for at least 2hours. No other complications noted at this time.

## 2015-06-03 NOTE — Progress Notes (Signed)
L groin stable under compression dressing. I have discussed with Kennon Portela PA-C who will followup with L groin bleeding issues. Pending blood transfusion. Also has hgb and hct ar 10pm today.   Hilbert Corrigan PA Pager: [phone]

## 2015-06-04 ENCOUNTER — Other Ambulatory Visit: Payer: Self-pay | Admitting: Physician Assistant

## 2015-06-04 ENCOUNTER — Ambulatory Visit: Payer: Medicare Other

## 2015-06-04 ENCOUNTER — Other Ambulatory Visit: Payer: Medicare Other

## 2015-06-04 DIAGNOSIS — I739 Peripheral vascular disease, unspecified: Secondary | ICD-10-CM

## 2015-06-04 DIAGNOSIS — D649 Anemia, unspecified: Secondary | ICD-10-CM

## 2015-06-04 DIAGNOSIS — I998 Other disorder of circulatory system: Secondary | ICD-10-CM

## 2015-06-04 DIAGNOSIS — I70249 Atherosclerosis of native arteries of left leg with ulceration of unspecified site: Secondary | ICD-10-CM | POA: Diagnosis not present

## 2015-06-04 LAB — CBC
HEMATOCRIT: 29.7 % — AB (ref 39.0–52.0)
Hemoglobin: 9.6 g/dL — ABNORMAL LOW (ref 13.0–17.0)
MCH: 28.2 pg (ref 26.0–34.0)
MCHC: 32.3 g/dL (ref 30.0–36.0)
MCV: 87.1 fL (ref 78.0–100.0)
PLATELETS: 130 10*3/uL — AB (ref 150–400)
RBC: 3.41 MIL/uL — ABNORMAL LOW (ref 4.22–5.81)
RDW: 19.4 % — AB (ref 11.5–15.5)
WBC: 5.1 10*3/uL (ref 4.0–10.5)

## 2015-06-04 LAB — BASIC METABOLIC PANEL
ANION GAP: 7 (ref 5–15)
BUN: 31 mg/dL — ABNORMAL HIGH (ref 6–20)
CALCIUM: 8.3 mg/dL — AB (ref 8.9–10.3)
CHLORIDE: 110 mmol/L (ref 101–111)
CO2: 22 mmol/L (ref 22–32)
CREATININE: 1.25 mg/dL — AB (ref 0.61–1.24)
GFR calc non Af Amer: 51 mL/min — ABNORMAL LOW (ref >60)
GFR, EST AFRICAN AMERICAN: 59 mL/min — AB (ref >60)
GLUCOSE: 94 mg/dL (ref 65–99)
POTASSIUM: 4.4 mmol/L (ref 3.5–5.1)
Sodium: 139 mmol/L (ref 135–145)

## 2015-06-04 LAB — TYPE AND SCREEN
ABO/RH(D): B NEG
ANTIBODY SCREEN: NEGATIVE
UNIT DIVISION: 0
Unit division: 0

## 2015-06-04 MED ORDER — CLOPIDOGREL BISULFATE 75 MG PO TABS: 75.0000 mg | ORAL_TABLET | Freq: Every day | ORAL | Status: DC

## 2015-06-04 MED ORDER — TRAZODONE HCL 50 MG PO TABS
50.0000 mg | ORAL_TABLET | Freq: Every evening | ORAL | Status: DC | PRN
Start: 2015-06-04 — End: 2015-06-04
  Administered 2015-06-04: 50 mg via ORAL
  Filled 2015-06-04: qty 1

## 2015-06-04 MED ORDER — ASPIRIN 81 MG PO CHEW
81.0000 mg | CHEWABLE_TABLET | Freq: Every day | ORAL | Status: DC
  Administered 2015-06-04: 09:00:00 81 mg via ORAL
  Filled 2015-06-04: qty 1

## 2015-06-04 MED ORDER — ASPIRIN 81 MG PO CHEW: 81.0000 mg | CHEWABLE_TABLET | Freq: Every day | ORAL | Status: DC

## 2015-06-04 NOTE — Discharge Instructions (Signed)
No driving for 24 hours. No lifting over 5 lbs for 1 week. No sexual activity for 1 week. Keep procedure site clean & dry. If you notice increased pain, swelling, bleeding or pus, call/return!  You may shower, but no soaking baths/hot tubs/pools for 1 week.  ° ° °

## 2015-06-04 NOTE — Progress Notes (Signed)
Patient Name: Timothy [name] Date of Encounter: 06/04/2015  Primary Cardiologist: Dr. Martinique  Vascular specialist: Dr. Gwenlyn Found Vascular surgeon: Dr. Scot Dock   Principal Problem:   Critical lower limb ischemia Active Problems:   Ischemic cardiomyopathy   Multiple myeloma   Hypertension   Renal insufficiency   Anemia   Systolic CHF, chronic   CAD (coronary artery disease)   S/P CABG (coronary artery bypass graft)   Atherosclerotic PVD with ulceration    SUBJECTIVE  Denies any CP or SOB. Did not sleep last night due to bed position. No discomfort. Eager to be discharged.   CURRENT MEDS . sodium chloride   Intravenous Once  . acyclovir  400 mg Oral BID  . allopurinol  150 mg Oral Daily  . aspirin EC  325 mg Oral Daily  . atorvastatin  10 mg Oral QPM  . carvedilol  6.25 mg Oral BID WC  . clopidogrel  75 mg Oral Q breakfast  . furosemide  40 mg Oral Daily  . isosorbide mononitrate  30 mg Oral Daily  . lisinopril  20 mg Oral Daily  . tamsulosin  0.4 mg Oral QHS  . vitamin C  250 mg Oral BID    OBJECTIVE  Filed Vitals:   06/04/15 0200 06/04/15 0256 06/04/15 0345 06/04/15 0724  BP: 125/53 98/60 100/49 115/57  Pulse: 93 87 88 97  Temp: 97.9 F (36.6 C) 97.9 F (36.6 C) 98.3 F (36.8 C) 97.9 F (36.6 C)  TempSrc: Oral Oral Oral Oral  Resp:   18 18  Height:      Weight:   177 lb 14.6 oz (80.7 kg)   SpO2: 92% 97% 95% 97%    Intake/Output Summary (Last 24 hours) at 06/04/15 0733 Last data filed at 06/04/15 0726  Gross per 24 hour  Intake   1230 ml  Output   3075 ml  Net  -1845 ml   Filed Weights   06/03/15 0707 06/04/15 0345  Weight: 182 lb (82.555 kg) 177 lb 14.6 oz (80.7 kg)    PHYSICAL EXAM  General: Pleasant, NAD. Neuro: Alert and oriented X 3. Moves all extremities spontaneously. Psych: Normal affect. HEENT:  Normal  Neck: Supple without bruits or JVD. Lungs:  Resp regular and unlabored, CTA. Heart: RRR no s3, s4, or murmurs. L groing cath site  stable, fem stop removed. Stable distal pulse Abdomen: Soft, non-tender, non-distended, BS + x 4.  Extremities: No clubbing, cyanosis or edema.  Accessory Clinical Findings  CBC  Recent Labs  06/03/15 2311 06/04/15 0511  WBC 5.0 5.1  NEUTROABS 3.2  --   HGB 9.6* 9.6*  HCT 30.0* 29.7*  MCV 89.6 87.1  PLT 126* 277*   Basic Metabolic Panel  Recent Labs  06/03/15 0804 06/04/15 0511  NA 140 139  K 3.9 4.4  CL 107 110  CO2 24 22  GLUCOSE 96 94  BUN 34* 31*  CREATININE 1.33* 1.25*  CALCIUM 8.9 8.3*    TELE NSR with frequent PVCs, longest run 3 beats    ECG  No new EKG  Echocardiogram 12/09/2014  LV EF: 35%  ------------------------------------------------------------------- Indications:   R94.39 Abnormal Stress Test.  ------------------------------------------------------------------- History:  PMH: Acquired from the patient and from the patient&'s chart. PMH: Chronic Systolic Heart Failure. Peripheral Artery Disease. CAD. Ischemic Cardiomyopathy. Multiple Myeloma. Shortness of breath. MI. Renal Insufficiency. Murmur. Risk factors: Former tobacco use. Hypertension.  ------------------------------------------------------------------- Study Conclusions  - Left ventricle: Septal and apical hypokinesis posterior lateral and  inferobasal akinesis. The cavity size was moderately dilated. Wall thickness was increased in a pattern of mild LVH. The estimated ejection fraction was 35%. - Mitral valve: There was mild regurgitation. - Left atrium: The atrium was moderately dilated. - Atrial septum: No defect or patent foramen ovale was identified.    Radiology/Studies  Dg Chest 2 View  05/21/2015   CLINICAL DATA:  Preoperative study prior to lower extremity in GIA of rib they ; balance and pair min, and shortness of breath over the past several months; history of previous CABG and long-term tobacco use now discontinued for many years, history of  multiple myeloma  EXAM: CHEST  2 VIEW  COMPARISON:  Chest x-ray and chest CT scan of July 06, 2014  FINDINGS: There are subtle increased densities in the right mid and lower lungs. These generally overlie the ribs and may be related to previous trauma. On the chest x-ray of July 06, 2014 a fracture of the lateral aspect of the seventh rib was observed. There are demonstrated on the frontal radiograph but not on the lateral radiograph. The cardiac silhouette is mildly enlarged. The pulmonary vascularity is not engorged. There are post CABG changes. There is no pleural effusion. The thoracic vertebral bodies are preserved in height.  IMPRESSION: 1. There are areas of patchy increased density over the right mid and lower lung on the frontal radiograph not evident on the lateral view. This may be related to previous anterior rib injuries rather than to parenchymal or pleural abnormalities. 2. Stable enlargement of the cardiac silhouette without pulmonary vascular congestion. 3. If the patient is having cardiopulmonary symptoms now, chest CT scanning prior to the arteriography procedure would be useful.   Electronically Signed   By: David  Martinique M.D.   On: 05/21/2015 15:15    ASSESSMENT AND PLAN  81 yo male who recently underwent LE angiography on 05/27/2015 noted to have 95% calcified L common illiac artery stenosis came in on 7/14 for staged rotational atherectomy noted previously. Post procedure course complicated by significant L groin bleeding requiring transfusion last night.   1. PAD with claudication  - LE angiography on 03/22/2014 moderate iliac dx, high grade calcified distal L common femoral artery dx with occluded profunda femoris, moderately calcified segmental mid L SFA dx. He subsequently underwent L common femoral artery endarterectomy and patch angioplasty by Dr. Scot Dock  - recurrent ulcer is his L 2nd toe prompted another PAD evaluation. Doppler performed on 05/14/2015 revealed decline in his  ABI from 0.71 to 0.57 with what appears to be an occluded L common iliac artery  - outpatient LE angiography 05/27/2015 95% calcified complex prox L common iliac atery stenosis, widely patent L common femoral endarterectomy site and prox L SFA.  - outpatient stage diamondback orbital rotational atherectomy along with PTCA and placement of Viabahn covered stent 06/03/2015 for high grade calcified prox L common illiac artery stenosis noted on cath 05/27/2015.   - normally would keep additional day for obs given bleeding, however pt very eager to be discharged, will discuss with MD to potentially discharge today and obtain CBC at [facility]. Will also obtain L LE arterial doppler in 1 week and followup with Dr. Gwenlyn Found in 2-3 weeks at [facility].  Change ASA to 81 mg a day   2. Mixed normoanemia   - had difficulty controlling L groin bleeding from cath site yesterday, hgb was 8.4 on arrival prior to the procedure, given bleeding, transfused 2 units of blood yesterday.   -  hgb 9.6 x 2 consecutive lab, stable.   3. CAD s/p 4v CABG by Dr. Roxy Manns in 2002 4. Chronic systolic HF with baseline EF 35% on echo Jan 2016 5. HTN 6. HLD 7. Multiple myeloma s/p chemo  Signed, Woodward Ku Pager: [phone]  Attending Note:   The patient was seen and examined.  Agree with assessment and plan as noted above.  Changes made to the above note as needed.  Pt had some post procedure bleeding which has not resolved.  No significant hematoma. Has not been out of bed in 2 days.  Will need to get up with assistance and will need to walk with a walker prior to Dc  Will lower ASA dose to 81 mg a day    Thayer Headings, Brooke Bonito., MD, [facility] 06/04/2015, 8:34 AM [address],  [place] Pager [phone]

## 2015-06-04 NOTE — Discharge Summary (Signed)
Discharge Summary   Patient ID: Timothy [name],  MRN: [id number], DOB/AGE: 79-Oct-1929 79 y.o.  Admit date: 06/03/2015 Discharge date: 06/04/2015  Primary Care Provider: [name] Primary Cardiologist: Dr. Martinique  Vascular specialist: Dr. Gwenlyn Found Vascular surgeon: Dr. Scot Dock  Discharge Diagnoses Principal Problem:   Critical lower limb ischemia Active Problems:   Ischemic cardiomyopathy   Multiple myeloma   Hypertension   Renal insufficiency   Anemia   Systolic CHF, chronic   CAD (coronary artery disease)   S/P CABG (coronary artery bypass graft)   Atherosclerotic PVD with ulceration   Allergies Allergies  Allergen Reactions  . Morphine And Related Other (See Comments)    Agitated with morphine drip    Procedures  LE angiography and stenting Procedures Performed: 1. Diamondback orbital rotational atherectomy left common iliac artery 2. Viabahn covered stenting left common iliac artery  Final Impression: successful diamondback orbital rotational atherectomy, PTCA and covered stenting using a Viabahn covered stent in the setting of critical limb ischemia. The sheath will be removed and pressure held once the ACT falls below 170. The patient will be hydrated overnight given his mild to moderate renal insufficiency. He'll be treated with dual anti-platelet therapy including aspirin and Plavix. He'll be discharged home in the morning after his labs are reviewed. His hemoglobin was 8.4 on admission. We will obtain lower extremity arterial Doppler studies in our [place] office next week and I will see him back in 2-3 weeks for follow-up.    Hospital Course  The patient is a 81 year old Caucasian male with past medical history of PAD, CAD s/p four-vessel CABG by Dr. Roxy Manns in 3500, chronic systolic heart failure with baseline EF 35% on echo January 2016, HTN, HLD, and a history of multiple myeloma s/p chemotherapy. He had a lower  extremity angiography done on 03/22/2014 which showed moderate iliac disease, high-grade calcified distal left common femoral artery disease with occluded profunda femoris, moderately calcified segmental mid left circumflex disease. He subsequently underwent a left common femoral artery endarterectomy and patch angioplasty by Dr. Doren Custard. Later, he had recurrent ulcer in his left second toe prompting another PAD evaluation. Doppler obtained on 05/14/2015 revealed a decline of his ABI from 0.71 to 0.57 with what appeared to be occluded left common iliac artery. Patient underwent diagnostic lower extremity angiography on 05/27/2015 which showed 95% calcified complex proximal left common iliac artery stenosis, widely patent left common femoral endarterectomy site and proximal left SFA. He was brought back to [facility] on 06/03/2015 for outpatient diamondback orbital rotational atherectomy along with PTCA and placement of Viabahn covered stent 06/03/2015 for high grade calcified prox L common illiac artery stenosis noted on cath 05/27/2015.   Post procedure course was complicated by significant bleeding from the left femoral cath site. Of note, patient's hemoglobin was 8.4 on arrival prior to the procedure. Even the significant bleeding, he was transfused with 2 units of packed red blood cell. 2 CBCs were obtained 6 hours apart after transfusion, both shows hemoglobin were stable at 9.6. Bleeding was eventually stopped after placement of a FemoStop compression device for several hours. He was seen in the morning of 06/04/2015, patient was very eager to be discharged, his left femoral cath site appears to be stable without further bleeding. He has been ambulating in the hallway without significant discomfort. He is deemed stable for discharge from cardiology perspective. Given recent bleed, we have decreased his daily aspirin from 325 mg to 81 mg per day. He will also need  Plavix which I have sent one year worth of  Plavix to his mail in pharmacy and given him an additional 30 days of Plavix paper prescription.  I have arranged a follow-up CBC in our office next week along with left lower extremity doppler at the earliest available time before follow-up with Dr. Allyson Sabal in the clinic. I have instructed the patient not to lift anything greater than 5 pounds for one week due to concern of bleed.   Discharge Vitals Blood pressure 100/60, pulse 89, temperature 97.8 F (36.6 C), temperature source Oral, resp. rate 18, height 5\' 10"  (1.778 m), weight 177 lb 14.6 oz (80.7 kg), SpO2 97 %.  Filed Weights   06/03/15 0707 06/04/15 0345  Weight: 182 lb (82.555 kg) 177 lb 14.6 oz (80.7 kg)    Labs  CBC  Recent Labs  06/03/15 2311 06/04/15 0511  WBC 5.0 5.1  NEUTROABS 3.2  --   HGB 9.6* 9.6*  HCT 30.0* 29.7*  MCV 89.6 87.1  PLT 126* 130*   Basic Metabolic Panel  Recent Labs  06/03/15 0804 06/04/15 0511  NA 140 139  K 3.9 4.4  CL 107 110  CO2 24 22  GLUCOSE 96 94  BUN 34* 31*  CREATININE 1.33* 1.25*  CALCIUM 8.9 8.3*    Disposition  Pt is being discharged home today in good condition.  Follow-up Plans & Appointments      Follow-up Information    Follow up with 06/06/15, MD On 06/25/2015.   Specialties:  Cardiology, Radiology   Why:  11:15am   Contact information:   [address] [place] [phone]       Follow up with [facility] On 06/09/2015.   Specialty:  Cardiology   Why:  onbtain CBC lab in next week at [facility] between normal lab hour 8:30am until 4:30pm   Contact information:   [address] [place] [phone]      Follow up with [facility] On 06/21/2015.   Why:  1:30pm. L Lower extremity arterial doppler   Contact information:   [address] [place] [phone]      Discharge Medications    Medication List    STOP taking  these medications        aspirin EC 81 MG tablet  Replaced by:  aspirin 81 MG chewable tablet      TAKE these medications        acyclovir 400 MG tablet  Commonly known as:  ZOVIRAX  TAKE 1 TABLET TWICE A DAY     allopurinol 300 MG tablet  Commonly known as:  ZYLOPRIM  Take 0.5 tablets (150 mg total) by mouth daily.     aspirin 81 MG chewable tablet  Chew 1 tablet (81 mg total) by mouth daily.  Notes to Patient:  NEW FREQUENCY:  TAKE EVERY DAY     atorvastatin 10 MG tablet  Commonly known as:  LIPITOR  Take 1 tablet (10 mg total) by mouth every evening.     B COMPLETE PO  Take 1 tablet by mouth daily.     cadexomer iodine 0.9 % gel  Commonly known as:  IODOSORB  Apply 1 application topically daily.     carvedilol 6.25 MG tablet  Commonly known as:  COREG  Take 1 tablet (6.25 mg total) by mouth 2 (two) times daily with a meal.     clopidogrel 75 MG tablet  Commonly known as:  PLAVIX  Take 1 tablet (75 mg total) by mouth daily with breakfast.  Notes to Patient:  NEW MEDICINE     CO Q-10 PO  Take 1 tablet by mouth every morning.     darbepoetin 200 MCG/0.4ML Soln injection  Commonly known as:  ARANESP  Inject 200 mcg into the skin as directed.     dexamethasone 4 MG tablet  Commonly known as:  DECADRON  TAKE 5 TABLETS TWO CONSECUTIVE DAYS EVERY WEEK     diphenoxylate-atropine 2.5-0.025 MG per tablet  Commonly known as:  LOMOTIL  Take 1 tablet by mouth 4 (four) times daily as needed for diarrhea or loose stools.     furosemide 40 MG tablet  Commonly known as:  LASIX  Take 1 tablet (40 mg total) by mouth daily.     HYDROmorphone 4 MG tablet  Commonly known as:  DILAUDID  Take 1 tablet (4 mg total) by mouth every 4 (four) hours as needed for severe pain.     isosorbide mononitrate 30 MG 24 hr tablet  Commonly known as:  IMDUR  Take 1 tablet (30 mg total) by mouth daily.     lisinopril 20 MG tablet  Commonly known as:  PRINIVIL,ZESTRIL  TAKE 1 TABLET  DAILY     multivitamin capsule  Take 1 capsule by mouth every morning.     nitroGLYCERIN 0.4 MG SL tablet  Commonly known as:  NITROSTAT  Place 1 tablet (0.4 mg total) under the tongue every 5 (five) minutes as needed for chest pain.     ondansetron 8 MG tablet  Commonly known as:  ZOFRAN  Take 1 tablet (8 mg total) by mouth 2 (two) times daily as needed (Nausea or vomiting).     polyethylene glycol packet  Commonly known as:  MIRALAX / GLYCOLAX  Take 17 g by mouth daily as needed.     rOPINIRole 3 MG tablet  Commonly known as:  REQUIP  Take 1 tablet (3 mg total) by mouth at bedtime.     silver sulfADIAZINE 1 % cream  Commonly known as:  SILVADENE  Apply 1 application topically daily.     sodium chloride 0.9 % SOLN 250 mL with cyclophosphamide 1 G SOLR 300 mg/m2  Inject 300 mg/m2 into the vein every 14 (fourteen) days.     tamsulosin 0.4 MG Caps capsule  Commonly known as:  FLOMAX  Take 0.4 mg by mouth at bedtime.     tobramycin 0.3 % ophthalmic solution  Commonly known as:  TOBREX  Place 1 drop into both eyes daily as needed.     vitamin C 500 MG tablet  Commonly known as:  ASCORBIC ACID  Take 250 mg by mouth 2 (two) times daily.     ZOMETA IV  Inject 1 each into the vein once.        Outstanding Labs/Studies  L LE arterial doppler  Duration of Discharge Encounter   Greater than 30 minutes including physician time.  Hilbert Corrigan PA-C Pager: [phone] 06/04/2015, 2:27 PM   Attending Note:   The patient was seen and examined.  Agree with assessment and plan as noted above.  Changes made to the above note as needed.  Pt has done well s/p PV procedure .  Leg looks stable Will follow up with Dr. Jeannette How, Brooke Bonito., MD, [facility] 06/04/2015, 6:19 PM [address]  [place] Pager [phone]

## 2015-06-08 ENCOUNTER — Ambulatory Visit (HOSPITAL_BASED_OUTPATIENT_CLINIC_OR_DEPARTMENT_OTHER): Payer: Medicare Other | Admitting: Oncology

## 2015-06-08 ENCOUNTER — Telehealth: Payer: Self-pay | Admitting: Oncology

## 2015-06-08 ENCOUNTER — Ambulatory Visit (HOSPITAL_BASED_OUTPATIENT_CLINIC_OR_DEPARTMENT_OTHER): Payer: Medicare Other

## 2015-06-08 VITALS — BP 122/42 | HR 95 | Temp 97.4°F | Resp 18 | Ht 70.0 in | Wt 185.8 lb

## 2015-06-08 DIAGNOSIS — L98499 Non-pressure chronic ulcer of skin of other sites with unspecified severity: Principal | ICD-10-CM

## 2015-06-08 DIAGNOSIS — C9000 Multiple myeloma not having achieved remission: Secondary | ICD-10-CM

## 2015-06-08 DIAGNOSIS — D630 Anemia in neoplastic disease: Secondary | ICD-10-CM | POA: Diagnosis present

## 2015-06-08 DIAGNOSIS — I70209 Unspecified atherosclerosis of native arteries of extremities, unspecified extremity: Secondary | ICD-10-CM

## 2015-06-08 DIAGNOSIS — M869 Osteomyelitis, unspecified: Secondary | ICD-10-CM

## 2015-06-08 DIAGNOSIS — I739 Peripheral vascular disease, unspecified: Secondary | ICD-10-CM

## 2015-06-08 LAB — CBC WITH DIFFERENTIAL/PLATELET
BASO%: 0.9 % (ref 0.0–2.0)
BASOS ABS: 0.1 10*3/uL (ref 0.0–0.1)
EOS%: 1.3 % (ref 0.0–7.0)
Eosinophils Absolute: 0.1 10*3/uL (ref 0.0–0.5)
HEMATOCRIT: 29.4 % — AB (ref 38.4–49.9)
HGB: 9.7 g/dL — ABNORMAL LOW (ref 13.0–17.1)
LYMPH%: 14.5 % (ref 14.0–49.0)
MCH: 29.7 pg (ref 27.2–33.4)
MCHC: 33 g/dL (ref 32.0–36.0)
MCV: 89.9 fL (ref 79.3–98.0)
MONO#: 0.6 10*3/uL (ref 0.1–0.9)
MONO%: 7.2 % (ref 0.0–14.0)
NEUT#: 6.1 10*3/uL (ref 1.5–6.5)
NEUT%: 76.1 % — ABNORMAL HIGH (ref 39.0–75.0)
Platelets: 166 10*3/uL (ref 140–400)
RBC: 3.27 10*6/uL — AB (ref 4.20–5.82)
RDW: 19.8 % — AB (ref 11.0–14.6)
WBC: 8 10*3/uL (ref 4.0–10.3)
lymph#: 1.2 10*3/uL (ref 0.9–3.3)
nRBC: 0 % (ref 0–0)

## 2015-06-08 LAB — TECHNOLOGIST REVIEW

## 2015-06-08 MED ORDER — DIPHENOXYLATE-ATROPINE 2.5-0.025 MG PO TABS: 1.0000 | ORAL_TABLET | Freq: Four times a day (QID) | ORAL | Status: DC | PRN

## 2015-06-08 MED ORDER — DARBEPOETIN ALFA 300 MCG/0.6ML IJ SOSY
300.0000 ug | PREFILLED_SYRINGE | Freq: Once | INTRAMUSCULAR | Status: AC
  Administered 2015-06-08: 300 ug via SUBCUTANEOUS
  Filled 2015-06-08: qty 0.6

## 2015-06-08 MED ORDER — HYDROMORPHONE HCL 4 MG PO TABS: 4.0000 mg | ORAL_TABLET | ORAL | Status: DC | PRN

## 2015-06-08 NOTE — Progress Notes (Signed)
ID: Timothy [name]   DOB: [birth year]  MR#: [id number]  EKC#:[id number]  PCP:  SU: OTHER MD: Dorna Leitz, Alysia Penna, Georgia Duff, Geryl Councilman  CHIEF COMPLAINT: multiple myeloma  CURRENT THERAPY: cyclophosphamide, dexamethasone, darbepoetin   MYELOMA HISTORY: From the earlier summary:  Patient presented in April 2007 with symptomatic slowly progressive anemia, with normal iron parameters, folic acid, and vitamin B12. Plans were made to obtain bone marrow aspirate and biopsy as an outpatient, however the patient was hospitalized shortly thereafter with a creatinine of 8.6. Renal biopsy demonstrated the presence of  light chain deposition disease. Bone marrow biopsy demonstrated 37% plasma cells. A skeletal survey showed small calvarial lytic lesions, mild osteopenia, and cervical spondylosis. With a well-established diagnosis of light-chain multiple myeloma Dr. Melodie Bouillon subsequent treatments are as detailed below.   INTERVAL HISTORY: Timothy [name] returns today for follow up of his multiple myeloma accompanied by his wife [name]. Since the last visit here he underwent stenting of his left common iliac artery. The procedure went well but he had significant bleeding post procedure and required transfusion of 2 units packed red cells. His been told to stay to his chair for the next several days. He will be following up with cardiology and they will liberalize his functional status appropriately from that point.  REVIEW OF SYSTEMS: Timothy [name] has had no further leading from his recent procedure but he does have pain in his leg. He uses by [facility] did for that. He has no energy. His feet swell. He bruises easily, being on both Plavix and aspirin at this point. He has back pain and it is difficult for him to walk. Many of these symptoms really are not new but they are somewhat more aggravated at this point. There has been no fever, rash,  unusual headache, nausea, or vomiting. He continues to have significant retinal problems.  PAST MEDICAL HISTORY: Past Medical History  Diagnosis Date  . ASCVD (arteriosclerotic cardiovascular disease)   . Gout   . Hypertension   . Lumbar disc disease     post laminectomy  . History of echocardiogram 11/02/2010    EF range of 30 to 35% / There is hypokinsesis of the basal-mild inferolateral myocardium  . CHF (congestive heart failure)   . Heart murmur   . Ischemic cardiomyopathy   . Renal insufficiency   . Peripheral neuropathy   . Hyperlipidemia   . SOB (shortness of breath)   . Fatigue   . Peripheral neuropathy   . Coronary artery disease     CABG 2002 by Dr. Roxy Manns with LIMA to LAD, SVG to intermediate, SVG to LCX, & SVG to PL. Last nuclear in 2012 showing large inferior scar with EF of 39%.   . Blood transfusion 10/2011; 07/2013    "multiple myeloma; when I broke my leg"  . History of shingles MARCH 2009    SHINGLE LESIONS WERE AROUND RIGHT EYE--PT HAS RESIDUAL ITCHING AROUND THE EYE.  . Osteomyelitis     s/p toe amputation  . Chronic systolic heart failure     EF of 30% per echo 02/2012  . Protein malnutrition   . Critical lower limb ischemia   . Peripheral vascular disease   . Hx of cardiovascular stress test     Lexiscan Myoview (1/16):  Large inferior and apical scar; no ischemia, EF 27%;  High Risk  . Inferior myocardial infarction 1986  . Pulmonary embolism     "when I had  big toe amputated"  . Chronic anemia     "due to the multiple myeloma"  . Chronic lower back pain   . Multiple myeloma dx'd 04/2006    with recurrence of increasing problems  DR. [place] -ONCOLOGIST.  PT HAS BEEN OFF CHEMO SINCE HIS HOSPITALIZATION FEB 2013 FOR LEFT FOOT INFECTION  . Basal cell carcinoma     "chest left arm"   PAST SURGICAL HISTORY: Past Surgical History  Procedure Laterality Date  . Lumbar laminectomy    . Cardiac catheterization  06/20/2000    Severe coronary disease  (totally occluded right artery, 90% left circumflex, 50-60% intermediate, and 90-95% ostial left anterior descending)   . Coronary artery bypass graft  2002    x4 / Left internal mammary artery to the LAD.  / Saphenous vein graft to the right posterolateral.  /  Saphenous vein graft to  the ramus intermedius. / Saphenous vein graft to the circumflex marginal     . Amputation  03/06/2012    Procedure: AMPUTATION RAY;  Surgeon: Alta Corning, MD;  Location: [facility];  Service: Orthopedics;  Laterality: Left;  First Ray Amputation  . Intramedullary (im) nail intertrochanteric Right 08/17/2013    Procedure: INTRAMEDULLARY (IM) NAIL INTERTROCHANTRIC;  Surgeon: Marybelle Killings, MD;  Location: [facility];  Service: Orthopedics;  Laterality: Right;  . Endarterectomy femoral Left 03/24/2014    Procedure: Left Common Femoral  Artery Endarterectomy with Vein Patch Angioplasty,  Ultrasound ;  Surgeon: Angelia Mould, MD;  Location: [place];  Service: Vascular;  Laterality: Left;  . Cataract extraction w/ intraocular lens  implant, bilateral Bilateral   . Lower extremity angiogram N/A 03/23/2014    Procedure: LOWER EXTREMITY ANGIOGRAM;  Surgeon: Lorretta Harp, MD;  Location: [facility] CATH LAB;  Service: Cardiovascular;  Laterality: N/A;  . Peripheral vascular catheterization N/A 05/27/2015    Procedure: Lower Extremity Angiography;  Surgeon: Lorretta Harp, MD;  Location: [place] CV LAB;  Service: Cardiovascular;  Laterality: N/A;  . Peripheral vascular catheterization  06/03/2015    Procedure: Peripheral Vascular Intervention;  Surgeon: Lorretta Harp, MD;  Location: [place] CV LAB;  Service: Cardiovascular;;  Lt Common Iliac  . Tonsillectomy  ~ 1936  . Inguinal hernia repair Bilateral   . Fracture surgery    . Back surgery    . Basal cell carcinoma excision      "chest left arm"  . 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 He and his wife [name] are the only ones at home.    ADVANCED DIRECTIVES: [name] has wife is his healthcare part of attorney  HEALTH MAINTENANCE: History  Substance Use Topics  . Smoking status: Former Smoker -- 1.00 packs/day for 20 years    Types: Cigarettes    Quit date: 11/20/1984  . Smokeless tobacco: Never Used  . Alcohol Use: Yes     Comment: 06/03/2015 "might have a drink a few times/year"     Colonoscopy:  Bone density:  Lipid panel:  Allergies  Allergen Reactions  . Morphine And Related Other (See Comments)    Agitated with morphine drip    Current Outpatient Prescriptions  Medication Sig Dispense Refill  . acyclovir (ZOVIRAX) 400 MG tablet TAKE 1 TABLET TWICE A DAY 60 tablet 2  . allopurinol (ZYLOPRIM) 300 MG tablet Take  0.5 tablets (150 mg total) by mouth daily. 90 tablet 3  . aspirin 81 MG chewable tablet Chew 1 tablet (81 mg total) by mouth daily.    Marland Kitchen atorvastatin (LIPITOR) 10 MG tablet Take 1 tablet (10 mg total) by mouth every evening. 90 tablet 3  . B Complex-Biotin-FA (B COMPLETE PO) Take 1 tablet by mouth daily.     . cadexomer iodine (IODOSORB) 0.9 % gel Apply 1 application topically daily.    . carvedilol (COREG) 6.25 MG tablet Take 1 tablet (6.25 mg total) by mouth 2 (two) times daily with a meal. 180 tablet 3  . clopidogrel (PLAVIX) 75 MG tablet Take 1 tablet (75 mg total) by mouth daily with breakfast. 30 tablet 0  . Coenzyme Q10 (CO Q-10 PO) Take 1 tablet by mouth every morning.     . darbepoetin (ARANESP) 200 MCG/0.4ML SOLN injection Inject 200 mcg into the skin as directed.    Marland Kitchen dexamethasone (DECADRON) 4 MG tablet TAKE 5 TABLETS TWO CONSECUTIVE DAYS EVERY WEEK 40 tablet 5  . diphenoxylate-atropine (LOMOTIL) 2.5-0.025 MG per tablet Take 1 tablet by mouth 4 (four) times daily as needed for diarrhea or loose stools. 30 tablet 0  . furosemide  (LASIX) 40 MG tablet Take 1 tablet (40 mg total) by mouth daily. 90 tablet 1  . HYDROmorphone (DILAUDID) 4 MG tablet Take 1 tablet (4 mg total) by mouth every 4 (four) hours as needed for severe pain. 120 tablet 0  . isosorbide mononitrate (IMDUR) 30 MG 24 hr tablet Take 1 tablet (30 mg total) by mouth daily. 90 tablet 3  . lisinopril (PRINIVIL,ZESTRIL) 20 MG tablet TAKE 1 TABLET DAILY 90 tablet 2  . Multiple Vitamin (MULTIVITAMIN) capsule Take 1 capsule by mouth every morning.     . nitroGLYCERIN (NITROSTAT) 0.4 MG SL tablet Place 1 tablet (0.4 mg total) under the tongue every 5 (five) minutes as needed for chest pain. 25 tablet 3  . ondansetron (ZOFRAN) 8 MG tablet Take 1 tablet (8 mg total) by mouth 2 (two) times daily as needed (Nausea or vomiting). 30 tablet 1  . polyethylene glycol (MIRALAX / GLYCOLAX) packet Take 17 g by mouth daily as needed. 14 each 0  . rOPINIRole (REQUIP) 3 MG tablet Take 1 tablet (3 mg total) by mouth at bedtime. 90 tablet 4  . silver sulfADIAZINE (SILVADENE) 1 % cream Apply 1 application topically daily. 50 g 0  . sodium chloride 0.9 % SOLN 250 mL with cyclophosphamide 1 G SOLR 300 mg/m2 Inject 300 mg/m2 into the vein every 14 (fourteen) days.    . tamsulosin (FLOMAX) 0.4 MG CAPS capsule Take 0.4 mg by mouth at bedtime.    Marland Kitchen tobramycin (TOBREX) 0.3 % ophthalmic solution Place 1 drop into both eyes daily as needed.  4  . vitamin C (ASCORBIC ACID) 500 MG tablet Take 250 mg by mouth 2 (two) times daily.     . Zoledronic Acid (ZOMETA IV) Inject 1 each into the vein once.     No current facility-administered medications for this visit.    Objective: Older white man who appears stated age 82 Vitals:   06/08/15 1030  BP: 122/42  Pulse: 95  Temp: 97.4 F (36.3 C)  Resp: 18        Body mass index is 26.66 kg/(m^2).    ECOG FS: 2 Filed Weights   06/08/15 1030  Weight: 185 lb 12.8 oz (84.278 kg)   Sclerae unicteric, pupils round and equal Oropharynx clear  and  moist-- no thrush or other lesions No cervical or supraclavicular adenopathy Lungs no rales or rhonchi Heart regular rate and rhythm Abd soft, nontender, positive bowel sounds MSK no focal spinal tenderness Neuro: nonfocal, well oriented, appropriate affect    LAB RESULTS:  Results for OMARR, HANN (MRN [id number]) as of 06/08/2015 10:59  Ref. Range 02/12/2015 14:37 03/12/2015 10:31 04/09/2015 10:21 04/23/2015 10:28 05/07/2015 10:30  Kappa free light chain Latest Ref Range: 0.33-1.94 mg/dL 49.10 (H) 33.20 (H) 17.30 (H)  45.30 (H)  Lambda Free Lght Chn Latest Ref Range: 0.57-2.63 mg/dL 1.61 1.17 0.47 (L)  1.37  Kappa:Lambda Ratio Latest Ref Range: 0.26-1.65  30.50 (H) 28.38 (H) 36.81 (H)  33.07 (H)     Lab Results  Component Value Date   WBC 5.1 06/04/2015   NEUTROABS 3.2 06/03/2015   HGB 9.6* 06/04/2015   HCT 29.7* 06/04/2015   MCV 87.1 06/04/2015   PLT 130* 06/04/2015      Chemistry      Component Value Date/Time   NA 139 06/04/2015 0511   NA 137 05/21/2015 1040   K 4.4 06/04/2015 0511   K 4.2 05/21/2015 1040   CL 110 06/04/2015 0511   CL 98 05/12/2013 1048   CO2 22 06/04/2015 0511   CO2 22 05/21/2015 1040   BUN 31* 06/04/2015 0511   BUN 33.6* 05/21/2015 1040   CREATININE 1.25* 06/04/2015 0511   CREATININE 1.3 05/21/2015 1040      Component Value Date/Time   CALCIUM 8.3* 06/04/2015 0511   CALCIUM 9.0 05/21/2015 1040   ALKPHOS 46 05/07/2015 1030   ALKPHOS 42 03/25/2014 2115   AST 29 05/07/2015 1030   AST 25 03/25/2014 2115   ALT 26 05/07/2015 1030   ALT 10 03/25/2014 2115   BILITOT 0.50 05/07/2015 1030   BILITOT 0.7 03/25/2014 2115      STUDIES: Dg Chest 2 View  05/21/2015   CLINICAL DATA:  Preoperative study prior to lower extremity in GIA of rib they ; balance and pair min, and shortness of breath over the past several months; history of previous CABG and long-term tobacco use now discontinued for many years, history of multiple myeloma  EXAM: CHEST  2 VIEW   COMPARISON:  Chest x-ray and chest CT scan of July 06, 2014  FINDINGS: There are subtle increased densities in the right mid and lower lungs. These generally overlie the ribs and may be related to previous trauma. On the chest x-ray of July 06, 2014 a fracture of the lateral aspect of the seventh rib was observed. There are demonstrated on the frontal radiograph but not on the lateral radiograph. The cardiac silhouette is mildly enlarged. The pulmonary vascularity is not engorged. There are post CABG changes. There is no pleural effusion. The thoracic vertebral bodies are preserved in height.  IMPRESSION: 1. There are areas of patchy increased density over the right mid and lower lung on the frontal radiograph not evident on the lateral view. This may be related to previous anterior rib injuries rather than to parenchymal or pleural abnormalities. 2. Stable enlargement of the cardiac silhouette without pulmonary vascular congestion. 3. If the patient is having cardiopulmonary symptoms now, chest CT scanning prior to the arteriography procedure would be useful.   Electronically Signed   By: David  Martinique M.D.   On: 05/21/2015 15:15     ASSESSMENT: 81 y.o.  with kappa light chain multiple myeloma   (1) presenting April 2007 with anemia and renal failure; renal  Bx showing free kappa light chain deposition; bone marrow biopsy showing 37% plasma cells, with normal cytogenetics and FISH;bone survey showing skull lytic lesions; treated with   (2) thalidomide (200 mg/d) and dexamethasone (40 mg/d x4d Q28d) June through Nov 2007, with good response, but poor tolerance;   (3) thalidomide decreased to 100 mg/d, dexamethasone continued, bortezomib (IV) added, Dec 2007 to March 2008   (4) treatment interrupted by multiple complications (peripheral neuropathy, pulmonary embolism, diverticular abscess, CN V zoster, severe DDD, congestive heart failure, rising PSA); maintenance zolendronic acid through March 2012    (5) progression April 2012, treated initially with dexamethasone alone, poorly tolerated;   (6) bortezomib (sq) resumed July 2012; cyclophosphamide and dexamethasone added Sept 2012; zolendronic acid changed to Q 42months; all treatments held as of March 2013 due to the development of the Left foot ulcer described above  (7) anemia, on aranesp December 2012 to August 2013; resumed Q14d starting May 2015, with slowly rising reticulocyte count  (8) status post Right trochanteric nail with proximal and distal interlock DePuy 11 mm one-pin lag screw, 44 mm distal interlock 08/09/2013 for a right comminuted intertrochanteric fracture  (9) status post removal of the first 2 toes left foot  (10) Left common femoral artery endarterectomy with vein patch angioplasty using left greater saphenous vein 03/24/2014  (11) status post fall with injury to the right hip, large hematoma, requiring [facility] until 07/30/2014  (12) on 10/09/2014 starting cyclophosphamide at 300 mg/m every 14 days and dexamethasone 20 mg daily 2 days each week  (13) SIADH: On fluid restriction, reduced to 1.5L on 12/04/14 because of drop in sodium  (14) chronic systolic congestive heart failure, with EF  35% per echo, with history CAD s/p CABG 2002  (15) CKD stage III  (16) peripheral vascular disease, status post outpatient stage diamondback orbital rotational atherectomy along with PTCA and placement of Viabahn covered stent 06/03/2015 for high grade calcified prox L common illiac artery stenosis noted on cath 05/27/2015  PLAN:   Timothy [name] has multiple comorbid conditions which are more significant than his multiple myeloma at this point. Nevertheless the treatment he is receiving is working well for him and he generally tolerates it well.  I am resuming his Aranesp shots today. This helps minimize his transfusion requirements. I am not however resuming his cyclophosphamide until a month from now. I think  that would only worsen his current situation. If we can start again in August that would be great.  In the meantime I refilled his Lomotil and his Dilantin loaded.  He is getting increasingly difficult to access. I think we would need to go off the Plavix and aspirin for while if we wanted a port. I don't think we need to do that for a PICC so I will set him up for that.  He will see me again in September but he knows to call for any problems that may develop before then. MAGRINAT,GUSTAV C    06/08/2015

## 2015-06-08 NOTE — Telephone Encounter (Signed)
Gave avs & calendar for August thru October

## 2015-06-09 ENCOUNTER — Other Ambulatory Visit: Payer: Self-pay | Admitting: Cardiovascular Disease

## 2015-06-09 ENCOUNTER — Telehealth: Payer: Self-pay | Admitting: Cardiovascular Disease

## 2015-06-09 DIAGNOSIS — I739 Peripheral vascular disease, unspecified: Secondary | ICD-10-CM

## 2015-06-09 NOTE — Telephone Encounter (Signed)
Spoke with patient's wife who reports patient was to have a CBC done this week, ordered by Dr. Gwenlyn Found after PV procedure. Informed wife of Hgb results, per her request.   Oncologist drew lab work yesterday (in [facility])   Message routed to Dr. Gwenlyn Found so he can review labs

## 2015-06-09 NOTE — Telephone Encounter (Signed)
Pt's wife called in stating that he was suppose to come to the lab to have his CBC drawn but his Oncologist was able to draw the lab for him and he will fax over the labs.  Thanks

## 2015-06-14 NOTE — Telephone Encounter (Signed)
Hemoglobin was stable.

## 2015-06-21 ENCOUNTER — Ambulatory Visit (HOSPITAL_COMMUNITY)
Admission: RE | Admit: 2015-06-21 | Discharge: 2015-06-21 | Disposition: A | Discharge status: Home / Self Care | Payer: Medicare Other | Source: Ambulatory Visit | Attending: Cardiology | Admitting: Cardiology

## 2015-06-21 DIAGNOSIS — I70209 Unspecified atherosclerosis of native arteries of extremities, unspecified extremity: Secondary | ICD-10-CM | POA: Diagnosis not present

## 2015-06-21 DIAGNOSIS — I739 Peripheral vascular disease, unspecified: Secondary | ICD-10-CM | POA: Diagnosis not present

## 2015-06-22 ENCOUNTER — Other Ambulatory Visit (HOSPITAL_BASED_OUTPATIENT_CLINIC_OR_DEPARTMENT_OTHER): Payer: Medicare Other

## 2015-06-22 ENCOUNTER — Ambulatory Visit: Payer: Medicare Other

## 2015-06-22 DIAGNOSIS — I739 Peripheral vascular disease, unspecified: Secondary | ICD-10-CM

## 2015-06-22 DIAGNOSIS — I70209 Unspecified atherosclerosis of native arteries of extremities, unspecified extremity: Secondary | ICD-10-CM

## 2015-06-22 DIAGNOSIS — L98499 Non-pressure chronic ulcer of skin of other sites with unspecified severity: Principal | ICD-10-CM

## 2015-06-22 DIAGNOSIS — C9000 Multiple myeloma not having achieved remission: Secondary | ICD-10-CM | POA: Diagnosis present

## 2015-06-22 DIAGNOSIS — M869 Osteomyelitis, unspecified: Secondary | ICD-10-CM

## 2015-06-22 DIAGNOSIS — D630 Anemia in neoplastic disease: Secondary | ICD-10-CM

## 2015-06-22 DIAGNOSIS — N289 Disorder of kidney and ureter, unspecified: Secondary | ICD-10-CM

## 2015-06-22 LAB — CBC WITH DIFFERENTIAL/PLATELET
BASO%: 1.4 % (ref 0.0–2.0)
BASOS ABS: 0.1 10*3/uL (ref 0.0–0.1)
EOS%: 3 % (ref 0.0–7.0)
Eosinophils Absolute: 0.2 10*3/uL (ref 0.0–0.5)
HCT: 32.2 % — ABNORMAL LOW (ref 38.4–49.9)
HGB: 10.3 g/dL — ABNORMAL LOW (ref 13.0–17.1)
LYMPH%: 13.2 % — ABNORMAL LOW (ref 14.0–49.0)
MCH: 29.4 pg (ref 27.2–33.4)
MCHC: 32 g/dL (ref 32.0–36.0)
MCV: 91.8 fL (ref 79.3–98.0)
MONO#: 0.4 10*3/uL (ref 0.1–0.9)
MONO%: 5.2 % (ref 0.0–14.0)
NEUT#: 5.4 10*3/uL (ref 1.5–6.5)
NEUT%: 77.2 % — ABNORMAL HIGH (ref 39.0–75.0)
PLATELETS: 157 10*3/uL (ref 140–400)
RBC: 3.51 10*6/uL — ABNORMAL LOW (ref 4.20–5.82)
RDW: 21.4 % — ABNORMAL HIGH (ref 11.0–14.6)
WBC: 7 10*3/uL (ref 4.0–10.3)
lymph#: 0.9 10*3/uL (ref 0.9–3.3)

## 2015-06-22 MED ORDER — DARBEPOETIN ALFA 300 MCG/0.6ML IJ SOSY: 300.0000 ug | PREFILLED_SYRINGE | Freq: Once | INTRAMUSCULAR | Status: DC

## 2015-06-24 LAB — PROTEIN ELECTROPHORESIS, SERUM
ALPHA-2-GLOBULIN: 0.8 g/dL (ref 0.5–0.9)
Albumin ELP: 3.3 g/dL — ABNORMAL LOW (ref 3.8–4.8)
Alpha-1-Globulin: 0.4 g/dL — ABNORMAL HIGH (ref 0.2–0.3)
Beta 2: 0.3 g/dL (ref 0.2–0.5)
Beta Globulin: 0.4 g/dL (ref 0.4–0.6)
GAMMA GLOBULIN: 0.7 g/dL — AB (ref 0.8–1.7)
TOTAL PROTEIN, SERUM ELECTROPHOR: 5.9 g/dL — AB (ref 6.1–8.1)

## 2015-06-25 ENCOUNTER — Ambulatory Visit (INDEPENDENT_AMBULATORY_CARE_PROVIDER_SITE_OTHER): Payer: Medicare Other | Admitting: Cardiovascular Disease

## 2015-06-25 ENCOUNTER — Encounter: Payer: Self-pay | Admitting: Cardiovascular Disease

## 2015-06-25 VITALS — BP 118/66 | HR 76 | Ht 70.0 in | Wt 184.0 lb

## 2015-06-25 DIAGNOSIS — L98499 Non-pressure chronic ulcer of skin of other sites with unspecified severity: Secondary | ICD-10-CM

## 2015-06-25 DIAGNOSIS — I739 Peripheral vascular disease, unspecified: Secondary | ICD-10-CM | POA: Diagnosis not present

## 2015-06-25 DIAGNOSIS — I255 Ischemic cardiomyopathy: Secondary | ICD-10-CM

## 2015-06-25 DIAGNOSIS — I70209 Unspecified atherosclerosis of native arteries of extremities, unspecified extremity: Secondary | ICD-10-CM

## 2015-06-25 MED ORDER — CLOPIDOGREL BISULFATE 75 MG PO TABS: 75.0000 mg | ORAL_TABLET | Freq: Every day | ORAL | Status: DC

## 2015-06-25 NOTE — Assessment & Plan Note (Signed)
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 He had an excellent angiographic and clinical result. No longer has pain in his leg. His left ABI is increased from 0.57-0.84. This was performed on 06/03/15. The procedure was stopped. By difficulty with hemostasis in his femoral artery puncture site requiring application of a "Fem-stop". He's got a 2+ left common femoral pulse with resolving ecchymosis and intact pedal pulses. I believe he would be an acceptable candidate for left third toe amputation with regards to healing from a peripheral vascular point of view should this be deemed to be necessary by his podiatrist, Dr. Jacqualyn Posey .

## 2015-06-25 NOTE — Progress Notes (Signed)
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 for peripheral vascular evaluation because of lifestyle limiting claudication and poorly healing ulcers on his feet. His podiatrist is [name] and his oncologist [name]. He has a history of hypertension and hyperlipidemia as well as family history of heart disease. He had coronary artery bypass grafting x4 by [name] in 2002. A Myoview stress test on January of last year showed scar without ischemia and a 2-D echo revealed an ejection fraction of 35-40%. He denies chest pain or shortness of breath. He also has multiple myeloma which has been quiet and since February 2013 when he received his last chemotherapy treatment. He complains of Left lower extremity lifestyle limiting claudication. He has a slowly healing ulcer on his left heel as well. He has had amputation of his left great toe and a portion of his left second toe. I performed a peripheral angiography 03/22/14 revealing moderate iliac disease, high-grade calcified distal left common femoral artery disease with occluded profunda femoris, moderately calcified segmental mid left SFA disease with two-vessel runoff. The patient ultimately underwent left common femoral artery endarterectomy and patch angioplasty by [name] with excellent angiographic and ultrasound as well as clinical result. His ulcer ultimately healed. He has been seeing [name] for a nonhealing ulcer on his left second toe. Dopplers performed on 05/14/15 revealed a decline in his ejection fraction from 0.71-0.57 with what appears to be an occluded left common iliac artery. I performed angiography on him one week ago that revealed a highly calcified subocclusive  left common iliac artery stenosis. His left common femoral endarterectomy site from one year ago was widely patent. Dr. [name] underwent diamondback orbital rotational atherectomy, PTA and covered stenting using an ICast covered stent by myself on 06/03/15 with an excellent angiographic and clinical result. His procedure was complicated by difficulty with hemostasis post sheath removal requiring application of a Fem-Stop device. His follow-up lower extremity arterial Doppler studies revealed improvement in his left ABI from 0.57, up to .84. He no longer has left leg or hip pain. He hasn't palpable pedal pulse. I believe he would be a suitable candidate to undergo left third toe amputation if necessary.  Current Outpatient Prescriptions  Medication Sig Dispense Refill  . acyclovir (ZOVIRAX) 400 MG tablet TAKE 1 TABLET TWICE A DAY 60 tablet 2  . allopurinol (ZYLOPRIM) 300 MG tablet Take 0.5 tablets (150 mg total) by mouth daily. 90 tablet 3  . aspirin 81 MG chewable tablet Chew 1 tablet (81 mg total) by mouth daily.    Marland Kitchen atorvastatin (LIPITOR) 10 MG tablet Take 1 tablet (10 mg total) by mouth every evening. 90 tablet 3  . B Complex-Biotin-FA (B COMPLETE PO) Take 1 tablet by mouth daily.     . cadexomer iodine (IODOSORB) 0.9 % gel Apply 1 application topically daily.    . carvedilol (COREG) 6.25 MG tablet Take 1 tablet (6.25 mg total) by mouth 2 (two) times daily with a meal. 180 tablet 3  . clopidogrel (PLAVIX) 75 MG tablet Take 1 tablet (75 mg total) by mouth daily with breakfast. 30 tablet 0  . Coenzyme Q10 (CO Q-10 PO) Take 1 tablet by mouth every morning.     . darbepoetin (ARANESP) 200  MCG/0.4ML SOLN injection Inject 200 mcg into the skin as directed.    Marland Kitchen dexamethasone (DECADRON) 4 MG tablet TAKE 5 TABLETS TWO CONSECUTIVE DAYS EVERY WEEK 40 tablet 5  . diphenoxylate-atropine (LOMOTIL) 2.5-0.025 MG per tablet Take 1 tablet by mouth 4 (four) times daily as needed for diarrhea or loose stools. 30  tablet 0  . furosemide (LASIX) 40 MG tablet Take 1 tablet (40 mg total) by mouth daily. 90 tablet 1  . HYDROmorphone (DILAUDID) 4 MG tablet Take 1 tablet (4 mg total) by mouth every 4 (four) hours as needed for severe pain. 120 tablet 0  . isosorbide mononitrate (IMDUR) 30 MG 24 hr tablet Take 1 tablet (30 mg total) by mouth daily. 90 tablet 3  . lisinopril (PRINIVIL,ZESTRIL) 20 MG tablet TAKE 1 TABLET DAILY 90 tablet 2  . Multiple Vitamin (MULTIVITAMIN) capsule Take 1 capsule by mouth every morning.     . nitroGLYCERIN (NITROSTAT) 0.4 MG SL tablet Place 1 tablet (0.4 mg total) under the tongue every 5 (five) minutes as needed for chest pain. 25 tablet 3  . ondansetron (ZOFRAN) 8 MG tablet Take 1 tablet (8 mg total) by mouth 2 (two) times daily as needed (Nausea or vomiting). 30 tablet 1  . polyethylene glycol (MIRALAX / GLYCOLAX) packet Take 17 g by mouth daily as needed. 14 each 0  . rOPINIRole (REQUIP) 3 MG tablet Take 1 tablet (3 mg total) by mouth at bedtime. 90 tablet 4  . silver sulfADIAZINE (SILVADENE) 1 % cream Apply 1 application topically daily. 50 g 0  . sodium chloride 0.9 % SOLN 250 mL with cyclophosphamide 1 G SOLR 300 mg/m2 Inject 300 mg/m2 into the vein every 14 (fourteen) days.    . tamsulosin (FLOMAX) 0.4 MG CAPS capsule Take 0.4 mg by mouth at bedtime.    Marland Kitchen tobramycin (TOBREX) 0.3 % ophthalmic solution Place 1 drop into both eyes daily as needed.  4  . vitamin C (ASCORBIC ACID) 500 MG tablet Take 250 mg by mouth 2 (two) times daily.     . Zoledronic Acid (ZOMETA IV) Inject 1 each into the vein once.     No current facility-administered medications for this visit.    Allergies  Allergen Reactions  . Morphine And Related Other (See Comments)    Agitated with morphine drip    History   Social History  . Marital Status: Married    Spouse Name: N/A  . Number of Children: N/A  . Years of Education: N/A   Occupational History  . Not on file.   Social History Main  Topics  . Smoking status: Former Smoker -- 1.00 packs/day for 20 years    Types: Cigarettes    Quit date: 11/20/1984  . Smokeless tobacco: Never Used  . Alcohol Use: Yes     Comment: 06/03/2015 "might have a drink a few times/year"  . Drug Use: No  . Sexual Activity: Not Currently   Other Topics Concern  . Not on file   Social History Narrative   Lives at home with wife, has children but not with current wife. Still active, golfing and walking in the yard before the issue with the wound, now using a walker.      Review of Systems: General: negative for chills, fever, night sweats or weight changes.  Cardiovascular: negative for chest pain, dyspnea on exertion, edema, orthopnea, palpitations, paroxysmal nocturnal dyspnea or shortness of breath Dermatological: negative for rash Respiratory: negative for cough or wheezing Urologic: negative  for hematuria Abdominal: negative for nausea, vomiting, diarrhea, bright red blood per rectum, melena, or hematemesis Neurologic: negative for visual changes, syncope, or dizziness All other systems reviewed and are otherwise negative except as noted above.    Blood pressure 118/66, pulse 76, height $RemoveBe'5\' 10"'jkpKlrupR$  (1.778 m), weight 184 lb (83.462 kg).  General appearance: alert and no distress Neck: no adenopathy, no carotid bruit, no JVD, supple, symmetrical, trachea midline and thyroid not enlarged, symmetric, no tenderness/mass/nodules Lungs: clear to auscultation bilaterally Heart: regular rate and rhythm, S1, S2 normal, no murmur, click, rub or gallop Extremities: extremities normal, atraumatic, no cyanosis or edema and resolving ecchymosis left thigh. Right femoral pulse 2+  EKG not performed today  ASSESSMENT AND PLAN:   Atherosclerotic PVD with ulceration Dr. Clemetine Marker returns today for follow-up of his left common iliac diamondback orbital rotational atherectomy, PTCA and covered stenting by myself of a subtotal 99% calcified ostial left common  iliac artery stenosis. He had an excellent angiographic and clinical result. No longer has pain in his leg. His left ABI is increased from 0.57-0.84. This was performed on 06/03/15. The procedure was stopped. By difficulty with hemostasis in his femoral artery puncture site requiring application of a "Fem-stop". He's got a 2+ left common femoral pulse with resolving ecchymosis and intact pedal pulses. I believe he would be an acceptable candidate for left third toe amputation with regards to healing from a peripheral vascular point of view should this be deemed to be necessary by his podiatrist, Dr. Jacqualyn Posey .      Lorretta Harp MD FACP,FACC,FAHA, [facility] 06/25/2015 12:28 PM

## 2015-06-25 NOTE — Patient Instructions (Signed)
Your physician wants you to follow-up in: 6 months after your ultrasound of your leg arteries.  You will receive a reminder letter in the mail two months in advance. If you don't receive a letter, please call our office to schedule the follow-up appointment.

## 2015-06-28 ENCOUNTER — Other Ambulatory Visit: Payer: Self-pay | Admitting: *Deleted

## 2015-06-28 DIAGNOSIS — L98499 Non-pressure chronic ulcer of skin of other sites with unspecified severity: Principal | ICD-10-CM

## 2015-06-28 DIAGNOSIS — I70209 Unspecified atherosclerosis of native arteries of extremities, unspecified extremity: Secondary | ICD-10-CM

## 2015-06-28 MED ORDER — CLOPIDOGREL BISULFATE 75 MG PO TABS: 75.0000 mg | ORAL_TABLET | Freq: Every day | ORAL | Status: DC

## 2015-07-01 ENCOUNTER — Ambulatory Visit (INDEPENDENT_AMBULATORY_CARE_PROVIDER_SITE_OTHER): Payer: Medicare Other | Admitting: Cardiology

## 2015-07-01 ENCOUNTER — Encounter: Payer: Self-pay | Admitting: Cardiology

## 2015-07-01 ENCOUNTER — Encounter: Payer: Self-pay | Admitting: Oncology

## 2015-07-01 VITALS — BP 140/60 | HR 72 | Ht 70.0 in | Wt 183.6 lb

## 2015-07-01 DIAGNOSIS — Z951 Presence of aortocoronary bypass graft: Secondary | ICD-10-CM

## 2015-07-01 DIAGNOSIS — I5022 Chronic systolic (congestive) heart failure: Secondary | ICD-10-CM

## 2015-07-01 DIAGNOSIS — I739 Peripheral vascular disease, unspecified: Secondary | ICD-10-CM | POA: Diagnosis not present

## 2015-07-01 DIAGNOSIS — I255 Ischemic cardiomyopathy: Secondary | ICD-10-CM | POA: Diagnosis not present

## 2015-07-01 DIAGNOSIS — I159 Secondary hypertension, unspecified: Secondary | ICD-10-CM | POA: Diagnosis not present

## 2015-07-01 DIAGNOSIS — I2510 Atherosclerotic heart disease of native coronary artery without angina pectoris: Secondary | ICD-10-CM | POA: Diagnosis not present

## 2015-07-01 NOTE — Progress Notes (Signed)
Timothy [name] Date of Birth: [birth year] Medical Record #[id number]  History of Present Illness: Timothy [name] is seen  today for follow up of CHF. He has a complex medical history which includes an ischemic CM with an EF of 35%. He has multiple myeloma with progressive anemia and is followed closely by oncology. He is on Decadron and Cytoxan. Other problems include HLD, depression, remote CABG in 2002, CKD, neuropathy, anemia and gout. He also has a history of hyponatremia.  He developed critical limb ischemia and underwent left femoral endarterectomy and patch angioplasty in May 2015. This year he has noted increased edema. More recently he developed an ulcer on his left second toe. Evaluation revealed a 95% left iliac stenosis and he underwent rotational atherectomy and stenting of this by Dr. Gwenlyn Found. ABIs have improved.   He does have a history of CHF. On lasix daily. Has a difficult time taking more due to frequent urination. Weight is stable and breathing is about the same. No increase in edema. Is planning to have a PICC line placed for chemoRx.   Current Outpatient Prescriptions on File Prior to Visit  Medication Sig Dispense Refill  . acyclovir (ZOVIRAX) 400 MG tablet TAKE 1 TABLET TWICE A DAY 60 tablet 2  . allopurinol (ZYLOPRIM) 300 MG tablet Take 0.5 tablets (150 mg total) by mouth daily. 90 tablet 3  . aspirin 81 MG chewable tablet Chew 1 tablet (81 mg total) by mouth daily.    Marland Kitchen atorvastatin (LIPITOR) 10 MG tablet Take 1 tablet (10 mg total) by mouth every evening. 90 tablet 3  . B Complex-Biotin-FA (B COMPLETE PO) Take 1 tablet by mouth daily.     . cadexomer iodine (IODOSORB) 0.9 % gel Apply 1 application topically daily.    . carvedilol (COREG) 6.25 MG tablet Take 1 tablet (6.25 mg total) by mouth 2 (two) times daily with a meal. 180 tablet 3  . clopidogrel (PLAVIX) 75 MG tablet Take 1 tablet (75 mg total) by mouth daily with breakfast. 90 tablet 3  . Coenzyme Q10 (CO Q-10 PO) Take 1  tablet by mouth every morning.     . darbepoetin (ARANESP) 200 MCG/0.4ML SOLN injection Inject 200 mcg into the skin as directed.    Marland Kitchen dexamethasone (DECADRON) 4 MG tablet TAKE 5 TABLETS TWO CONSECUTIVE DAYS EVERY WEEK 40 tablet 5  . diphenoxylate-atropine (LOMOTIL) 2.5-0.025 MG per tablet Take 1 tablet by mouth 4 (four) times daily as needed for diarrhea or loose stools. 30 tablet 0  . furosemide (LASIX) 40 MG tablet Take 1 tablet (40 mg total) by mouth daily. 90 tablet 1  . HYDROmorphone (DILAUDID) 4 MG tablet Take 1 tablet (4 mg total) by mouth every 4 (four) hours as needed for severe pain. 120 tablet 0  . isosorbide mononitrate (IMDUR) 30 MG 24 hr tablet Take 1 tablet (30 mg total) by mouth daily. 90 tablet 3  . lisinopril (PRINIVIL,ZESTRIL) 20 MG tablet TAKE 1 TABLET DAILY 90 tablet 2  . Multiple Vitamin (MULTIVITAMIN) capsule Take 1 capsule by mouth every morning.     . nitroGLYCERIN (NITROSTAT) 0.4 MG SL tablet Place 1 tablet (0.4 mg total) under the tongue every 5 (five) minutes as needed for chest pain. 25 tablet 3  . ondansetron (ZOFRAN) 8 MG tablet Take 1 tablet (8 mg total) by mouth 2 (two) times daily as needed (Nausea or vomiting). 30 tablet 1  . polyethylene glycol (MIRALAX / GLYCOLAX) packet Take 17 g by mouth daily as needed.  14 each 0  . rOPINIRole (REQUIP) 3 MG tablet Take 1 tablet (3 mg total) by mouth at bedtime. 90 tablet 4  . silver sulfADIAZINE (SILVADENE) 1 % cream Apply 1 application topically daily. 50 g 0  . sodium chloride 0.9 % SOLN 250 mL with cyclophosphamide 1 G SOLR 300 mg/m2 Inject 300 mg/m2 into the vein every 14 (fourteen) days.    . tamsulosin (FLOMAX) 0.4 MG CAPS capsule Take 0.4 mg by mouth at bedtime.    Marland Kitchen tobramycin (TOBREX) 0.3 % ophthalmic solution Place 1 drop into both eyes daily as needed.  4  . vitamin C (ASCORBIC ACID) 500 MG tablet Take 250 mg by mouth 2 (two) times daily.     . Zoledronic Acid (ZOMETA IV) Inject 1 each into the vein once.     No  current facility-administered medications on file prior to visit.    Allergies  Allergen Reactions  . Morphine And Related Other (See Comments)    Agitated with morphine drip    Past Medical History  Diagnosis Date  . ASCVD (arteriosclerotic cardiovascular disease)   . Gout   . Hypertension   . Lumbar disc disease     post laminectomy  . History of echocardiogram 11/02/2010    EF range of 30 to 35% / There is hypokinsesis of the basal-mild inferolateral myocardium  . CHF (congestive heart failure)   . Heart murmur   . Ischemic cardiomyopathy   . Renal insufficiency   . Peripheral neuropathy   . Hyperlipidemia   . SOB (shortness of breath)   . Fatigue   . Peripheral neuropathy   . Coronary artery disease     CABG 2002 by Dr. Roxy Manns with LIMA to LAD, SVG to intermediate, SVG to LCX, & SVG to PL. Last nuclear in 2012 showing large inferior scar with EF of 39%.   . Blood transfusion 10/2011; 07/2013    "multiple myeloma; when I broke my leg"  . History of shingles MARCH 2009    SHINGLE LESIONS WERE AROUND RIGHT EYE--PT HAS RESIDUAL ITCHING AROUND THE EYE.  . Osteomyelitis     s/p toe amputation  . Chronic systolic heart failure     EF of 30% per echo 02/2012  . Protein malnutrition   . Critical lower limb ischemia   . Peripheral vascular disease   . Hx of cardiovascular stress test     Lexiscan Myoview (1/16):  Large inferior and apical scar; no ischemia, EF 27%;  High Risk  . Inferior myocardial infarction 1986  . Pulmonary embolism     "when I had big toe amputated"  . Chronic anemia     "due to the multiple myeloma"  . Chronic lower back pain   . Multiple myeloma dx'd 04/2006    with recurrence of increasing problems  DR. [place] -ONCOLOGIST.  PT HAS BEEN OFF CHEMO SINCE HIS HOSPITALIZATION FEB 2013 FOR LEFT FOOT INFECTION  . Basal cell carcinoma     "chest left arm"    Past Surgical History  Procedure Laterality Date  . Lumbar laminectomy    . Cardiac  catheterization  06/20/2000    Severe coronary disease (totally occluded right artery, 90% left circumflex, 50-60% intermediate, and 90-95% ostial left anterior descending)   . Coronary artery bypass graft  2002    x4 / Left internal mammary artery to the LAD.  / Saphenous vein graft to the right posterolateral.  /  Saphenous vein graft to  the ramus intermedius. /  Saphenous vein graft to the circumflex marginal     . Amputation  03/06/2012    Procedure: AMPUTATION RAY;  Surgeon: Alta Corning, MD;  Location: [facility];  Service: Orthopedics;  Laterality: Left;  First Ray Amputation  . Intramedullary (im) nail intertrochanteric Right 08/17/2013    Procedure: INTRAMEDULLARY (IM) NAIL INTERTROCHANTRIC;  Surgeon: Marybelle Killings, MD;  Location: [facility];  Service: Orthopedics;  Laterality: Right;  . Endarterectomy femoral Left 03/24/2014    Procedure: Left Common Femoral  Artery Endarterectomy with Vein Patch Angioplasty,  Ultrasound ;  Surgeon: Angelia Mould, MD;  Location: [facility];  Service: Vascular;  Laterality: Left;  . Cataract extraction w/ intraocular lens  implant, bilateral Bilateral   . Lower extremity angiogram N/A 03/23/2014    Procedure: LOWER EXTREMITY ANGIOGRAM;  Surgeon: Lorretta Harp, MD;  Location: [facility] CATH LAB;  Service: Cardiovascular;  Laterality: N/A;  . Peripheral vascular catheterization N/A 05/27/2015    Procedure: Lower Extremity Angiography;  Surgeon: Lorretta Harp, MD;  Location: [place] CV LAB;  Service: Cardiovascular;  Laterality: N/A;  . Peripheral vascular catheterization  06/03/2015    Procedure: Peripheral Vascular Intervention;  Surgeon: Lorretta Harp, MD;  Location: [place] CV LAB;  Service: Cardiovascular;;  Lt Common Iliac  . Tonsillectomy  ~ 1936  . Inguinal hernia repair Bilateral   . Fracture surgery    . Back surgery    . Basal cell carcinoma excision      "chest left arm"  . Toe amputation Left ~ 2014    History  Smoking status  . Former  Smoker -- 1.00 packs/day for 20 years  . Types: Cigarettes  . Quit date: 11/20/1984  Smokeless tobacco  . Never Used    History  Alcohol Use  . Yes    Comment: 06/03/2015 "might have a drink a few times/year"    Family History  Problem Relation Age of Onset  . Heart attack Mother   . Heart disease Father     Review of Systems: The review of systems is per the HPI.  All other systems were reviewed and are negative.  Physical Exam: BP 140/60 mmHg  Pulse 72  Ht $R'5\' 10"'cE$  (1.778 m)  Wt 83.28 kg (183 lb 9.6 oz)  BMI 26.34 kg/m2 Patient is very pleasant, obese and in no acute distress. Skin is warm and dry. Color is normal.  HEENT is unremarkable. Normocephalic/atraumatic. PERRL. Sclera are nonicteric. Neck is supple. No masses. No JVD. Lungs  are clear. Cardiac exam shows a regular rate and rhythm. Normal S1-2. No gallop or murmur. Abdomen is soft. Extremities reveal 1+ edema. Gait and ROM are intact. No gross neurologic deficits noted.  LABORATORY DATA:     Lab Results  Component Value Date   WBC 7.0 06/22/2015   HGB 10.3* 06/22/2015   HCT 32.2* 06/22/2015   PLT 157 06/22/2015   GLUCOSE 94 06/04/2015   CHOL 102 08/13/2013   TRIG 75 08/13/2013   HDL 37* 08/13/2013   LDLCALC 50 08/13/2013   ALT 26 05/07/2015   AST 29 05/07/2015   NA 139 06/04/2015   K 4.4 06/04/2015   CL 110 06/04/2015   CREATININE 1.25* 06/04/2015   BUN 31* 06/04/2015   CO2 22 06/04/2015   TSH 5.772* 05/21/2015   PSA 3.75 05/30/2006   INR 1.08 06/03/2015     Echo 12/09/13:Study Conclusions  - Left ventricle: Septal and apical hypokinesis posterior lateral and inferobasal akinesis. The cavity size  was moderately dilated. Wall thickness was increased in a pattern of mild LVH. The estimated ejection fraction was 35%. - Mitral valve: There was mild regurgitation. - Left atrium: The atrium was moderately dilated. - Atrial septum: No defect or patent foramen ovale was identified.  Cardiology  Nuclear Med Study  AKIM WATKINSON is a 81 y.o. male MRN : [id number] DOB: 1928-02-15  Procedure Date: 11/24/2014  Nuclear Med Background Indication for Stress Test: Evaluation for Ischemia and Graft Patency History: CAD-CABG, 12/18/12 MPI: EF: 41% Scar minimal peri-infarct ischemia Cardiac Risk Factors: Hypertension  Symptoms: SOB   Nuclear Pre-Procedure Caffeine/Decaff Intake: None> 12 hrs NPO After: 7:30am   Lungs: clear O2 Sat: 97% on room air. IV 0.9% NS with Angio Cath: 22g  IV Site: R Hand x 1, tolerated well IV Started by: Irven Baltimore, RN  Chest Size (in): 46 Cup Size: n/a  Height: $Remove'5\' 10"'rvpNDuj$  (1.778 m)  Weight: 193 lb (87.544 kg)  BMI: Body mass index is 27.69 kg/(m^2). Tech Comments: Patient took Carvedilol this am. Irven Baltimore, RN.    Nuclear Med Study 1 or 2 day study: 1 day  Stress Test Type: Carlton Adam  Reading MD: N/A  Order Authorizing Provider: Peter Martinique, MD  Resting Radionuclide: Technetium 49m Sestamibi  Resting Radionuclide Dose: 11.0 mCi   Stress Radionuclide: Technetium 37m Sestamibi  Stress Radionuclide Dose: 33.0 mCi      Stress Protocol Rest HR: 56 Stress HR: 76  Rest BP: 136/54 Stress BP: 127/65  Exercise Time (min): n/a METS: n/a   Predicted Max HR: 136 bpm % Max HR: 55.88 bpm Rate Pressure Product: 10336   Dose of Adenosine (mg): n/a Dose of Lexiscan: 0.4 mg  Dose of Atropine (mg): n/a Dose of Dobutamine: n/a mcg/kg/min (at max HR)  Stress Test Technologist: Perrin Maltese, EMT-P  Nuclear Technologist: Earl Many, CNMT     Rest Procedure: Myocardial perfusion imaging was performed at rest 45 minutes following the intravenous administration of Technetium 65m Sestamibi. Rest ECG: NSR - Normal EKG  Stress Procedure: The patient received IV Lexiscan 0.4 mg over 15-seconds. Technetium 73m Sestamibi injected at 30-seconds. This patient had sob, chest fullness, and was flushed  with the [facility] injection. Quantitative spect images were obtained after a 45 minute delay. Stress ECG: No significant change from baseline ECG and There are scattered PVCs.  QPS Raw Data Images: Normal; no motion artifact; normal heart/lung ratio. Stress Images: Fixed inferior and apical defects Rest Images: Fixed inferior and apical defects Subtraction (SDS): No evidence of ischemia. Transient Ischemic Dilatation (Normal <1.22): 1.19 Lung/Heart Ratio (Normal <0.45): 0.36  Quantitative Gated Spect Images QGS EDV: 175 ml QGS ESV: 127 ml  Impression Exercise Capacity: Lexiscan with no exercise. BP Response: Normal blood pressure response. Clinical Symptoms: Mild chest pain/dyspnea. ECG Impression: No significant ECG changes with Lexiscan. Comparison with Prior Nuclear Study: EF 41% with inferior scar  Overall Impression: High risk stress nuclear study with moderate intensity, large size fixed inferior and apical scar. No reversible ischemia.  LV Ejection Fraction: 27%. LV Wall Motion: Global hypokinesis with inferior akinesis/dyskinesis   Pixie Casino, MD, [place] Certified in Nuclear Cardiology Attending Cardiologist [facility]    Assessment / Plan: 1. Dyspnea on exertion. Multifactorial. Suspect predominantly CHF but also has anemia and marked deconditioning. Treatment limited. Unable to tolerate higher lasix dose due to frequent urination.  He is also anemic. There is a possibility of coronary ischemia since he is 14 years out from CABG but his  recent myoview did not show definite ischemia. I have recommended continuing lasix 40 mg daily. He is reluctant to pursue heart cath at this time given the risk of contrast induced nephropathy and I concur.  I have recommended continuing his current therapy and follow up in 4 months.  2. Chronic systolic CHF. EF is stable by Echo at 35%. Patient has a known ischemic cardiomyopathy.  Continue lasix 40 mg daily  and sodium and fluid restriction.  3. Multiple myeloma - followed by oncology. On Decadron and Cytoxan. Plan for PICC line instead of Porta Cath due to being on Plavix but I see no reason porta Cath couldn't be placed on Plavix.   4. HLD - on statin therapy.   5. Coronary disease status post CABG in 2002. Dyspnea could be anginal equivalent symptoms and in fact this is how he initially presented.  Myoview study shows a large inferior and apical scar without ischemia.  Continue  medical therapy for now.  6. Hyponatremia. Improved.  7. Anemia- chronic. On Aranesp as needed per oncology.  8. CKD. Needs follow up BMET post PV intervention. Planning to have follow up lab work next week with oncology.

## 2015-07-03 ENCOUNTER — Other Ambulatory Visit: Payer: Self-pay | Admitting: Nurse Practitioner

## 2015-07-04 ENCOUNTER — Other Ambulatory Visit: Payer: Self-pay | Admitting: Podiatrist

## 2015-07-05 ENCOUNTER — Telehealth: Payer: Self-pay | Admitting: *Deleted

## 2015-07-05 NOTE — Telephone Encounter (Signed)
INFORMED PT.'S WIFE THAT LAB ON 07/06/15 IS A CMET WHICH DOES INCLUDE PT.'S KIDNEY FUNCTION.

## 2015-07-06 ENCOUNTER — Ambulatory Visit (HOSPITAL_COMMUNITY)
Admission: RE | Admit: 2015-07-06 | Discharge: 2015-07-06 | Disposition: A | Discharge status: Home / Self Care | Payer: Medicare Other | Source: Ambulatory Visit | Attending: Oncology | Admitting: Oncology

## 2015-07-06 ENCOUNTER — Ambulatory Visit (HOSPITAL_BASED_OUTPATIENT_CLINIC_OR_DEPARTMENT_OTHER): Payer: Medicare Other

## 2015-07-06 ENCOUNTER — Other Ambulatory Visit: Payer: Self-pay | Admitting: Oncology

## 2015-07-06 ENCOUNTER — Other Ambulatory Visit (HOSPITAL_BASED_OUTPATIENT_CLINIC_OR_DEPARTMENT_OTHER): Payer: Medicare Other

## 2015-07-06 ENCOUNTER — Other Ambulatory Visit: Payer: Self-pay | Admitting: *Deleted

## 2015-07-06 ENCOUNTER — Telehealth: Payer: Self-pay | Admitting: Oncology

## 2015-07-06 ENCOUNTER — Telehealth: Payer: Self-pay | Admitting: *Deleted

## 2015-07-06 ENCOUNTER — Ambulatory Visit: Payer: Medicare Other

## 2015-07-06 VITALS — BP 101/50 | HR 73 | Temp 98.1°F | Resp 16

## 2015-07-06 DIAGNOSIS — Z5111 Encounter for antineoplastic chemotherapy: Secondary | ICD-10-CM | POA: Diagnosis present

## 2015-07-06 DIAGNOSIS — D630 Anemia in neoplastic disease: Secondary | ICD-10-CM

## 2015-07-06 DIAGNOSIS — C9000 Multiple myeloma not having achieved remission: Secondary | ICD-10-CM | POA: Insufficient documentation

## 2015-07-06 LAB — CBC & DIFF AND RETIC
BASO%: 0.7 % (ref 0.0–2.0)
BASOS ABS: 0.1 10*3/uL (ref 0.0–0.1)
EOS ABS: 0.1 10*3/uL (ref 0.0–0.5)
EOS%: 1.4 % (ref 0.0–7.0)
HEMATOCRIT: 28.5 % — AB (ref 38.4–49.9)
HGB: 9.2 g/dL — ABNORMAL LOW (ref 13.0–17.1)
IMMATURE RETIC FRACT: 8 % (ref 3.00–10.60)
LYMPH#: 1.1 10*3/uL (ref 0.9–3.3)
LYMPH%: 14.6 % (ref 14.0–49.0)
MCH: 29.4 pg (ref 27.2–33.4)
MCHC: 32.3 g/dL (ref 32.0–36.0)
MCV: 91.1 fL (ref 79.3–98.0)
MONO#: 0.3 10*3/uL (ref 0.1–0.9)
MONO%: 3.9 % (ref 0.0–14.0)
NEUT#: 6.1 10*3/uL (ref 1.5–6.5)
NEUT%: 79.4 % — AB (ref 39.0–75.0)
Platelets: 159 10*3/uL (ref 140–400)
RBC: 3.13 10*6/uL — ABNORMAL LOW (ref 4.20–5.82)
RDW: 19.7 % — AB (ref 11.0–14.6)
RETIC %: 1.93 % — AB (ref 0.80–1.80)
RETIC CT ABS: 60.41 10*3/uL (ref 34.80–93.90)
WBC: 7.6 10*3/uL (ref 4.0–10.3)

## 2015-07-06 LAB — COMPREHENSIVE METABOLIC PANEL (CC13)
ALT: 20 U/L (ref 0–55)
AST: 23 U/L (ref 5–34)
Albumin: 3.1 g/dL — ABNORMAL LOW (ref 3.5–5.0)
Alkaline Phosphatase: 45 U/L (ref 40–150)
Anion Gap: 7 mEq/L (ref 3–11)
BILIRUBIN TOTAL: 0.5 mg/dL (ref 0.20–1.20)
BUN: 31.5 mg/dL — AB (ref 7.0–26.0)
CHLORIDE: 107 meq/L (ref 98–109)
CO2: 21 meq/L — AB (ref 22–29)
CREATININE: 1.4 mg/dL — AB (ref 0.7–1.3)
Calcium: 8.5 mg/dL (ref 8.4–10.4)
EGFR: 45 mL/min/{1.73_m2} — ABNORMAL LOW (ref >90)
GLUCOSE: 143 mg/dL — AB (ref 70–140)
Potassium: 4.8 mEq/L (ref 3.5–5.1)
SODIUM: 135 meq/L — AB (ref 136–145)
TOTAL PROTEIN: 6 g/dL — AB (ref 6.4–8.3)

## 2015-07-06 LAB — FERRITIN CHCC: FERRITIN: 280 ng/mL (ref 22–316)

## 2015-07-06 MED ORDER — SODIUM CHLORIDE 0.9 % IJ SOLN
10.0000 mL | INTRAMUSCULAR | Status: DC | PRN
  Administered 2015-07-06: 10 mL
  Filled 2015-07-06: qty 10

## 2015-07-06 MED ORDER — HEPARIN LOCK FLUSH 100 UNIT/ML IV SOLN: INTRAVENOUS | Status: DC

## 2015-07-06 MED ORDER — DARBEPOETIN ALFA 300 MCG/0.6ML IJ SOSY
300.0000 ug | PREFILLED_SYRINGE | Freq: Once | INTRAMUSCULAR | Status: AC
  Administered 2015-07-06: 300 ug via SUBCUTANEOUS
  Filled 2015-07-06: qty 0.6

## 2015-07-06 MED ORDER — SODIUM CHLORIDE FLUSH 0.9 % IV SOLN: 10.0000 mL | INTRAVENOUS | Status: DC

## 2015-07-06 MED ORDER — LIDOCAINE HCL 1 % IJ SOLN
INTRAMUSCULAR | Status: AC
  Filled 2015-07-06: qty 20

## 2015-07-06 MED ORDER — SODIUM CHLORIDE 0.9 % IV SOLN
Freq: Once | INTRAVENOUS | Status: AC
  Administered 2015-07-06: 14:00:00 via INTRAVENOUS

## 2015-07-06 MED ORDER — SODIUM CHLORIDE 0.9 % IV SOLN
300.0000 mg/m2 | Freq: Once | INTRAVENOUS | Status: AC
  Administered 2015-07-06: 620 mg via INTRAVENOUS
  Filled 2015-07-06: qty 31

## 2015-07-06 MED ORDER — SODIUM CHLORIDE 0.9 % IV SOLN
Freq: Once | INTRAVENOUS | Status: AC
  Administered 2015-07-06: 14:00:00 via INTRAVENOUS
  Filled 2015-07-06: qty 4

## 2015-07-06 MED ORDER — HEPARIN SOD (PORK) LOCK FLUSH 100 UNIT/ML IV SOLN
250.0000 [IU] | Freq: Once | INTRAVENOUS | Status: AC | PRN
  Administered 2015-07-06: 250 [IU]
  Filled 2015-07-06: qty 5

## 2015-07-06 MED ORDER — SILVER SULFADIAZINE 1 % EX CREA: TOPICAL_CREAM | Freq: Every day | CUTANEOUS | Status: DC

## 2015-07-06 NOTE — Telephone Encounter (Signed)
Orders for home flushes sent to [place]

## 2015-07-06 NOTE — Procedures (Signed)
R arm PowerPICC placed under US and fluoroscopy No ptx on spot chest radiograph. No complication No blood loss. See complete dictation in [facility] PACS.  

## 2015-07-06 NOTE — Discharge Instructions (Signed)
PICC Insertion, Care After  Refer to this sheet in the next few weeks. These instructions provide you with information on caring for yourself after your procedure. Your health care provider may also give you more specific instructions. Your treatment has been planned according to current medical practices, but problems sometimes occur. Call your health care provider if you have any problems or questions after your procedure.  WHAT TO EXPECT AFTER THE PROCEDURE  After your procedure, it is typical to have the following:   Mild discomfort at the insertion site. This should not last more than a day.  HOME CARE INSTRUCTIONS   Rest at home for the remainder of the day after the procedure.   You may bend your arm and move it freely. If your PICC is near or at the bend of your elbow, avoid activity with repeated motion at the elbow.   Avoid lifting heavy objects as instructed by your health care provider.   Avoid using a crutch with the arm on the same side as your PICC. You may need to use a walker.  Bandage Care   Keep your PICC bandage (dressing) clean and dry to prevent infection.   Ask your health care provider when you may shower. To keep the dressing dry, cover the PICC with plastic wrap and tape before showering. If the dressing does become wet, replace it right after the shower.   Do not soak in the bath, swim, or use hot tubs when you have a PICC.   Change the PICC dressing as instructed by your health care provider.   Change your PICC dressing if it becomes loose or wet.  General PICC Care   Check the PICC insertion site daily for leakage, redness, swelling, or pain.   Flush the PICC as directed by your health care provider. Let your health care provider know right away if the PICC is difficult to flush or does not flush. Do not use force to flush the PICC.   Do not use a syringe that is less than 10 mL to flush the PICC.   Never pull or tug on the PICC.   Avoid blood pressure checks on the arm  with the PICC.   Keep your PICC identification card with you at all times.   Do not take the PICC out yourself. Only a trained health care professional should remove the PICC.  SEEK MEDICAL CARE IF:   You have pain in your arm, ear, face, or teeth.   You have fever or chills.   You have drainage from the PICC insertion site.   You have redness or palpate a "cord" around the PICC insertion site.   You cannot flush the catheter.  SEEK IMMEDIATE MEDICAL CARE IF:   You have swelling in the arm in which the PICC is inserted.  Document Released: 08/27/2013 Document Revised: 11/11/2013 Document Reviewed: 08/27/2013  ExitCare Patient Information 2015 [employer]. This information is not intended to replace advice given to you by your health care provider. Make sure you discuss any questions you have with your health care provider.

## 2015-07-06 NOTE — Telephone Encounter (Signed)
Lft msg for pt confirming flush added on 08/18 per 08/16 POF...Marland KitchenMarland Kitchen

## 2015-07-06 NOTE — Progress Notes (Signed)
Aranesp injection given by infusion nurse. 

## 2015-07-06 NOTE — Telephone Encounter (Signed)
I attempted to call patient.  His wife stated, "He's not available, I can take a message."  I informed her that Dr. Berton Lan the refill request but he wants him to schedule an appointment to come in and see him.  "Okay, when does he want him to come in?  His toe is fine, we just put it on everyday to keep it from having a problem.  He got a cut on his leg so we're using it on his leg too."  Let me have a scheduler call you back to get him scheduled.  "Okay, that will be fine but he can't come in this week because he already has other doctor's appointments scheduled.

## 2015-07-06 NOTE — Patient Instructions (Signed)
[facility] Discharge Instructions for Patients Receiving Chemotherapy  Today you received the following chemotherapy agents:  Cytoxan  To help prevent nausea and vomiting after your treatment, we encourage you to take your nausea medication as ordered per MD.   If you develop nausea and vomiting that is not controlled by your nausea medication, call the clinic.   BELOW ARE SYMPTOMS THAT SHOULD BE REPORTED IMMEDIATELY:  *FEVER GREATER THAN 100.5 F  *CHILLS WITH OR WITHOUT FEVER  NAUSEA AND VOMITING THAT IS NOT CONTROLLED WITH YOUR NAUSEA MEDICATION  *UNUSUAL SHORTNESS OF BREATH  *UNUSUAL BRUISING OR BLEEDING  TENDERNESS IN MOUTH AND THROAT WITH OR WITHOUT PRESENCE OF ULCERS  *URINARY PROBLEMS  *BOWEL PROBLEMS  UNUSUAL RASH Items with * indicate a potential emergency and should be followed up as soon as possible.  Feel free to call the clinic you have any questions or concerns. The clinic phone number is [phone].  Please show the [facility] at check-in to the Emergency Department and triage nurse.

## 2015-07-08 ENCOUNTER — Ambulatory Visit (HOSPITAL_BASED_OUTPATIENT_CLINIC_OR_DEPARTMENT_OTHER): Payer: Medicare Other

## 2015-07-08 DIAGNOSIS — C9000 Multiple myeloma not having achieved remission: Secondary | ICD-10-CM | POA: Diagnosis not present

## 2015-07-08 DIAGNOSIS — Z452 Encounter for adjustment and management of vascular access device: Secondary | ICD-10-CM | POA: Diagnosis present

## 2015-07-08 LAB — PROTEIN ELECTROPHORESIS, SERUM
ALBUMIN ELP: 3.1 g/dL — AB (ref 3.8–4.8)
Alpha-1-Globulin: 0.4 g/dL — ABNORMAL HIGH (ref 0.2–0.3)
Alpha-2-Globulin: 0.9 g/dL (ref 0.5–0.9)
BETA GLOBULIN: 0.4 g/dL (ref 0.4–0.6)
Beta 2: 0.3 g/dL (ref 0.2–0.5)
Gamma Globulin: 0.8 g/dL (ref 0.8–1.7)
TOTAL PROTEIN, SERUM ELECTROPHOR: 5.9 g/dL — AB (ref 6.1–8.1)

## 2015-07-08 LAB — KAPPA/LAMBDA LIGHT CHAINS
Kappa free light chain: 70.5 mg/dL — ABNORMAL HIGH (ref 0.33–1.94)
Kappa:Lambda Ratio: 30.26 — ABNORMAL HIGH (ref 0.26–1.65)
LAMBDA FREE LGHT CHN: 2.33 mg/dL (ref 0.57–2.63)

## 2015-07-08 MED ORDER — HEPARIN SOD (PORK) LOCK FLUSH 100 UNIT/ML IV SOLN
500.0000 [IU] | Freq: Once | INTRAVENOUS | Status: AC
  Administered 2015-07-08: 250 [IU] via INTRAVENOUS
  Filled 2015-07-08: qty 5

## 2015-07-08 MED ORDER — SODIUM CHLORIDE 0.9 % IJ SOLN
10.0000 mL | INTRAMUSCULAR | Status: DC | PRN
  Administered 2015-07-08: 10 mL via INTRAVENOUS
  Filled 2015-07-08: qty 10

## 2015-07-08 NOTE — Progress Notes (Signed)
Pt and pt wife instructed on how to flush PICC line. Pt states they will pick up the saline and heparin from [facility] today. Informed her to flush should be done 3x a week or per MDs orders. Pt to come back next Thursday 07/15/15 for dressing change and flush. Pt wife will flush picc line on Saturday and Tuesday. New appt time given to pt and wife. Pt and wife both verbalized understanding and deny any further questions at this time.

## 2015-07-08 NOTE — Patient Instructions (Signed)
PICC Home Guide A peripherally inserted central catheter (PICC) is a long, thin, flexible tube that is inserted into a vein in the upper arm. It is a form of intravenous (IV) access. It is considered to be a "central" line because the tip of the PICC ends in a large vein in your chest. This large vein is called the superior vena cava (SVC). The PICC tip ends in the SVC because there is a lot of blood flow in the SVC. This allows medicines and IV fluids to be quickly distributed throughout the body. The PICC is inserted using a sterile technique by a specially trained nurse or physician. After the PICC is inserted, a chest X-ray exam is done to be sure it is in the correct place.  A PICC may be placed for different reasons, such as:  To give medicines and liquid nutrition that can only be given through a central line. Examples are:  Certain antibiotic treatments.  Chemotherapy.  Total parenteral nutrition (TPN).  To take frequent blood samples.  To give IV fluids and blood products.  If there is difficulty placing a peripheral intravenous (PIV) catheter. If taken care of properly, a PICC can remain in place for several months. A PICC can also allow a person to go home from the hospital early. Medicine and PICC care can be managed at home by a family member or home health care team. WHAT PROBLEMS CAN HAPPEN WHEN I HAVE A PICC? Problems with a PICC can occasionally occur. These may include the following:  A blood clot (thrombus) forming in or at the tip of the PICC. This can cause the PICC to become clogged. A clot-dissolving medicine called tissue plasminogen activator (tPA) can be given through the PICC to help break up the clot.  Inflammation of the vein (phlebitis) in which the PICC is placed. Signs of inflammation may include redness, pain at the insertion site, red streaks, or being able to feel a "cord" in the vein where the PICC is located.  Infection in the PICC or at the insertion  site. Signs of infection may include fever, chills, redness, swelling, or pus drainage from the PICC insertion site.  PICC movement (malposition). The PICC tip may move from its original position due to excessive physical activity, forceful coughing, sneezing, or vomiting.  A break or cut in the PICC. It is important to not use scissors near the PICC.  Nerve or tendon irritation or injury during PICC insertion. WHAT SHOULD I KEEP IN MIND ABOUT ACTIVITIES WHEN I HAVE A PICC?  You may bend your arm and move it freely. If your PICC is near or at the bend of your elbow, avoid activity with repeated motion at the elbow.  Rest at home for the remainder of the day following PICC line insertion.  Avoid lifting heavy objects as instructed by your health care provider.  Avoid using a crutch with the arm on the same side as your PICC. You may need to use a walker. WHAT SHOULD I KNOW ABOUT MY PICC DRESSING?  Keep your PICC bandage (dressing) clean and dry to prevent infection.  Ask your health care provider when you may shower. Ask your health care provider to teach you how to wrap the PICC when you do take a shower.  Change the PICC dressing as instructed by your health care provider.  Change your PICC dressing if it becomes loose or wet. WHAT SHOULD I KNOW ABOUT PICC CARE?  Check the PICC insertion site   daily for leakage, redness, swelling, or pain.  Do not take a bath, swim, or use hot tubs when you have a PICC. Cover PICC line with clear plastic wrap and tape to keep it dry while showering.  Flush the PICC as directed by your health care provider. Let your health care provider know right away if the PICC is difficult to flush or does not flush. Do not use force to flush the PICC.  Do not use a syringe that is less than 10 mL to flush the PICC.  Never pull or tug on the PICC.  Avoid blood pressure checks on the arm with the PICC.  Keep your PICC identification card with you at all  times.  Do not take the PICC out yourself. Only a trained clinical professional should remove the PICC. SEEK IMMEDIATE MEDICAL CARE IF:  Your PICC is accidentally pulled all the way out. If this happens, cover the insertion site with a bandage or gauze dressing. Do not throw the PICC away. Your health care provider will need to inspect it.  Your PICC was tugged or pulled and has partially come out. Do not  push the PICC back in.  There is any type of drainage, redness, or swelling where the PICC enters the skin.  You cannot flush the PICC, it is difficult to flush, or the PICC leaks around the insertion site when it is flushed.  You hear a "flushing" sound when the PICC is flushed.  You have pain, discomfort, or numbness in your arm, shoulder, or jaw on the same side as the PICC.  You feel your heart "racing" or skipping beats.  You notice a hole or tear in the PICC.  You develop chills or a fever. MAKE SURE YOU:   Understand these instructions.  Will watch your condition.  Will get help right away if you are not doing well or get worse. Document Released: 05/13/2003 Document Revised: 03/23/2014 Document Reviewed: 07/14/2013 ExitCare Patient Information 2015 [employer]. This information is not intended to replace advice given to you by your health care provider. Make sure you discuss any questions you have with your health care provider.  

## 2015-07-15 ENCOUNTER — Ambulatory Visit (HOSPITAL_BASED_OUTPATIENT_CLINIC_OR_DEPARTMENT_OTHER): Payer: Medicare Other

## 2015-07-15 DIAGNOSIS — Z452 Encounter for adjustment and management of vascular access device: Secondary | ICD-10-CM | POA: Diagnosis present

## 2015-07-15 DIAGNOSIS — C9000 Multiple myeloma not having achieved remission: Secondary | ICD-10-CM

## 2015-07-15 MED ORDER — HEPARIN SOD (PORK) LOCK FLUSH 100 UNIT/ML IV SOLN
500.0000 [IU] | Freq: Once | INTRAVENOUS | Status: AC
  Administered 2015-07-15: 500 [IU] via INTRAVENOUS
  Filled 2015-07-15: qty 5

## 2015-07-15 MED ORDER — SODIUM CHLORIDE 0.9 % IJ SOLN
10.0000 mL | INTRAMUSCULAR | Status: DC | PRN
  Administered 2015-07-15: 10 mL via INTRAVENOUS
  Filled 2015-07-15: qty 10

## 2015-07-20 ENCOUNTER — Ambulatory Visit (HOSPITAL_BASED_OUTPATIENT_CLINIC_OR_DEPARTMENT_OTHER): Payer: Medicare Other

## 2015-07-20 ENCOUNTER — Other Ambulatory Visit: Payer: Self-pay | Admitting: *Deleted

## 2015-07-20 ENCOUNTER — Other Ambulatory Visit (HOSPITAL_BASED_OUTPATIENT_CLINIC_OR_DEPARTMENT_OTHER): Payer: Medicare Other

## 2015-07-20 VITALS — BP 136/60 | HR 76 | Temp 97.9°F

## 2015-07-20 DIAGNOSIS — I70209 Unspecified atherosclerosis of native arteries of extremities, unspecified extremity: Secondary | ICD-10-CM

## 2015-07-20 DIAGNOSIS — C9000 Multiple myeloma not having achieved remission: Secondary | ICD-10-CM

## 2015-07-20 DIAGNOSIS — Z452 Encounter for adjustment and management of vascular access device: Secondary | ICD-10-CM | POA: Diagnosis present

## 2015-07-20 DIAGNOSIS — M869 Osteomyelitis, unspecified: Secondary | ICD-10-CM

## 2015-07-20 DIAGNOSIS — I739 Peripheral vascular disease, unspecified: Secondary | ICD-10-CM

## 2015-07-20 DIAGNOSIS — D630 Anemia in neoplastic disease: Secondary | ICD-10-CM

## 2015-07-20 DIAGNOSIS — L98499 Non-pressure chronic ulcer of skin of other sites with unspecified severity: Principal | ICD-10-CM

## 2015-07-20 LAB — CBC WITH DIFFERENTIAL/PLATELET
BASO%: 2.5 % — ABNORMAL HIGH (ref 0.0–2.0)
Basophils Absolute: 0.1 10*3/uL (ref 0.0–0.1)
EOS ABS: 0.2 10*3/uL (ref 0.0–0.5)
EOS%: 4.3 % (ref 0.0–7.0)
HCT: 31.3 % — ABNORMAL LOW (ref 38.4–49.9)
HGB: 10 g/dL — ABNORMAL LOW (ref 13.0–17.1)
LYMPH%: 25.6 % (ref 14.0–49.0)
MCH: 29.2 pg (ref 27.2–33.4)
MCHC: 31.9 g/dL — AB (ref 32.0–36.0)
MCV: 91.3 fL (ref 79.3–98.0)
MONO#: 0.5 10*3/uL (ref 0.1–0.9)
MONO%: 12.4 % (ref 0.0–14.0)
NEUT%: 55.2 % (ref 39.0–75.0)
NEUTROS ABS: 2.2 10*3/uL (ref 1.5–6.5)
PLATELETS: 180 10*3/uL (ref 140–400)
RBC: 3.43 10*6/uL — AB (ref 4.20–5.82)
RDW: 19 % — ABNORMAL HIGH (ref 11.0–14.6)
WBC: 4 10*3/uL (ref 4.0–10.3)
lymph#: 1 10*3/uL (ref 0.9–3.3)
nRBC: 0 % (ref 0–0)

## 2015-07-20 LAB — TECHNOLOGIST REVIEW

## 2015-07-20 MED ORDER — DARBEPOETIN ALFA 300 MCG/0.6ML IJ SOSY
300.0000 ug | PREFILLED_SYRINGE | Freq: Once | INTRAMUSCULAR | Status: DC
  Filled 2015-07-20: qty 0.6

## 2015-07-20 MED ORDER — HEPARIN SOD (PORK) LOCK FLUSH 100 UNIT/ML IV SOLN
500.0000 [IU] | Freq: Once | INTRAVENOUS | Status: AC
  Administered 2015-07-20: 250 [IU] via INTRAVENOUS
  Administered 2015-07-20: 500 [IU] via INTRAVENOUS
  Filled 2015-07-20: qty 5

## 2015-07-20 MED ORDER — SODIUM CHLORIDE 0.9 % IJ SOLN
10.0000 mL | INTRAMUSCULAR | Status: DC | PRN
  Administered 2015-07-20: 10 mL via INTRAVENOUS
  Filled 2015-07-20: qty 10

## 2015-07-22 ENCOUNTER — Other Ambulatory Visit: Payer: Self-pay

## 2015-07-23 ENCOUNTER — Telehealth: Payer: Self-pay | Admitting: Oncology

## 2015-07-23 ENCOUNTER — Other Ambulatory Visit: Payer: Self-pay

## 2015-07-23 DIAGNOSIS — C9000 Multiple myeloma not having achieved remission: Secondary | ICD-10-CM

## 2015-07-23 NOTE — Telephone Encounter (Signed)
Called and left a message with lab appointment °

## 2015-07-27 ENCOUNTER — Ambulatory Visit (HOSPITAL_BASED_OUTPATIENT_CLINIC_OR_DEPARTMENT_OTHER): Payer: Medicare Other

## 2015-07-27 ENCOUNTER — Other Ambulatory Visit: Payer: Self-pay | Admitting: *Deleted

## 2015-07-27 ENCOUNTER — Other Ambulatory Visit (HOSPITAL_BASED_OUTPATIENT_CLINIC_OR_DEPARTMENT_OTHER): Payer: Medicare Other

## 2015-07-27 VITALS — BP 134/57 | HR 77 | Temp 98.3°F | Resp 16

## 2015-07-27 DIAGNOSIS — Z95828 Presence of other vascular implants and grafts: Secondary | ICD-10-CM

## 2015-07-27 DIAGNOSIS — C9000 Multiple myeloma not having achieved remission: Secondary | ICD-10-CM

## 2015-07-27 LAB — CBC WITH DIFFERENTIAL/PLATELET
BASO%: 0.7 % (ref 0.0–2.0)
BASOS ABS: 0.1 10*3/uL (ref 0.0–0.1)
EOS%: 1.9 % (ref 0.0–7.0)
Eosinophils Absolute: 0.2 10*3/uL (ref 0.0–0.5)
HEMATOCRIT: 28.9 % — AB (ref 38.4–49.9)
HEMOGLOBIN: 9.2 g/dL — AB (ref 13.0–17.1)
LYMPH#: 1.2 10*3/uL (ref 0.9–3.3)
LYMPH%: 12.6 % — ABNORMAL LOW (ref 14.0–49.0)
MCH: 28.8 pg (ref 27.2–33.4)
MCHC: 31.8 g/dL — ABNORMAL LOW (ref 32.0–36.0)
MCV: 90.6 fL (ref 79.3–98.0)
MONO#: 0.6 10*3/uL (ref 0.1–0.9)
MONO%: 6 % (ref 0.0–14.0)
NEUT#: 7.2 10*3/uL — ABNORMAL HIGH (ref 1.5–6.5)
NEUT%: 78.8 % — AB (ref 39.0–75.0)
PLATELETS: 161 10*3/uL (ref 140–400)
RBC: 3.19 10*6/uL — ABNORMAL LOW (ref 4.20–5.82)
RDW: 19.1 % — ABNORMAL HIGH (ref 11.0–14.6)
WBC: 9.2 10*3/uL (ref 4.0–10.3)
nRBC: 0 % (ref 0–0)

## 2015-07-27 LAB — COMPREHENSIVE METABOLIC PANEL (CC13)
ALBUMIN: 3.2 g/dL — AB (ref 3.5–5.0)
ALK PHOS: 45 U/L (ref 40–150)
ALT: 17 U/L (ref 0–55)
ANION GAP: 8 meq/L (ref 3–11)
AST: 26 U/L (ref 5–34)
BUN: 21.1 mg/dL (ref 7.0–26.0)
CALCIUM: 9.1 mg/dL (ref 8.4–10.4)
CO2: 23 mEq/L (ref 22–29)
Chloride: 106 mEq/L (ref 98–109)
Creatinine: 1.3 mg/dL (ref 0.7–1.3)
EGFR: 50 mL/min/{1.73_m2} — AB (ref >90)
Glucose: 134 mg/dl (ref 70–140)
POTASSIUM: 4.6 meq/L (ref 3.5–5.1)
Sodium: 137 mEq/L (ref 136–145)
Total Bilirubin: 0.74 mg/dL (ref 0.20–1.20)
Total Protein: 6.3 g/dL — ABNORMAL LOW (ref 6.4–8.3)

## 2015-07-27 MED ORDER — SODIUM CHLORIDE 0.9 % IJ SOLN
10.0000 mL | INTRAMUSCULAR | Status: DC | PRN
  Administered 2015-07-27: 10 mL via INTRAVENOUS
  Filled 2015-07-27: qty 10

## 2015-07-27 MED ORDER — HEPARIN SOD (PORK) LOCK FLUSH 100 UNIT/ML IV SOLN
500.0000 [IU] | Freq: Once | INTRAVENOUS | Status: AC
  Administered 2015-07-27: 500 [IU] via INTRAVENOUS
  Filled 2015-07-27: qty 5

## 2015-07-27 NOTE — Patient Instructions (Signed)

## 2015-07-28 LAB — PROTEIN / CREATININE RATIO, URINE
CREATININE, URINE: 171.2 mg/dL
PROTEIN CREATININE RATIO: 0.17 — AB (ref <0.15)
TOTAL PROTEIN, URINE: 29 mg/dL — AB (ref 5–25)

## 2015-07-28 LAB — OSMOLALITY, URINE: Osmolality, Ur: 509 mOsm/kg (ref 390–1090)

## 2015-07-30 ENCOUNTER — Telehealth: Payer: Self-pay | Admitting: Cardiovascular Disease

## 2015-07-30 ENCOUNTER — Encounter: Payer: Self-pay | Admitting: Podiatry

## 2015-07-30 ENCOUNTER — Ambulatory Visit (INDEPENDENT_AMBULATORY_CARE_PROVIDER_SITE_OTHER): Payer: Medicare Other | Admitting: Podiatry

## 2015-07-30 ENCOUNTER — Telehealth: Payer: Self-pay | Admitting: *Deleted

## 2015-07-30 VITALS — BP 125/64 | HR 78 | Resp 18

## 2015-07-30 DIAGNOSIS — I255 Ischemic cardiomyopathy: Secondary | ICD-10-CM | POA: Diagnosis not present

## 2015-07-30 DIAGNOSIS — I739 Peripheral vascular disease, unspecified: Secondary | ICD-10-CM | POA: Diagnosis not present

## 2015-07-30 DIAGNOSIS — L84 Corns and callosities: Secondary | ICD-10-CM

## 2015-07-30 DIAGNOSIS — M869 Osteomyelitis, unspecified: Secondary | ICD-10-CM | POA: Diagnosis not present

## 2015-07-30 DIAGNOSIS — L97524 Non-pressure chronic ulcer of other part of left foot with necrosis of bone: Secondary | ICD-10-CM | POA: Diagnosis not present

## 2015-07-30 NOTE — Patient Instructions (Signed)
Pre-Operative Instructions  Congratulations, you have decided to take an important step to improving your quality of life.  You can be assured that the doctors of [facility] will be with you every step of the way.  1. Plan to be at the surgery center/hospital at least 1 (one) hour prior to your scheduled time unless otherwise directed by the surgical center/hospital staff.  You must have a responsible adult accompany you, remain during the surgery and drive you home.  Make sure you have directions to the surgical center/hospital and know how to get there on time. 2. For hospital based surgery you will need to obtain a history and physical form from your family physician within 1 month prior to the date of surgery- we will give you a form for you primary physician.  3. We make every effort to accommodate the date you request for surgery.  There are however, times where surgery dates or times have to be moved.  We will contact you as soon as possible if a change in schedule is required.   4. No Aspirin/Ibuprofen for one week before surgery.  If you are on aspirin, any non-steroidal anti-inflammatory medications (Mobic, Aleve, Ibuprofen) you should stop taking it 7 days prior to your surgery.  You make take Tylenol  For pain prior to surgery.  5. Medications- If you are taking daily heart and blood pressure medications, seizure, reflux, allergy, asthma, anxiety, pain or diabetes medications, make sure the surgery center/hospital is aware before the day of surgery so they may notify you which medications to take or avoid the day of surgery. 6. No food or drink after midnight the night before surgery unless directed otherwise by surgical center/hospital staff. 7. No alcoholic beverages 24 hours prior to surgery.  No smoking 24 hours prior to or 24 hours after surgery. 8. Wear loose pants or shorts- loose enough to fit over bandages, boots, and casts. 9. No slip on shoes, sneakers are best. 10. Bring  your boot with you to the surgery center/hospital.  Also bring crutches or a walker if your physician has prescribed it for you.  If you do not have this equipment, it will be provided for you after surgery. 11. If you have not been contracted by the surgery center/hospital by the day before your surgery, call to confirm the date and time of your surgery. 12. Leave-time from work may vary depending on the type of surgery you have.  Appropriate arrangements should be made prior to surgery with your employer. 13. Prescriptions will be provided immediately following surgery by your doctor.  Have these filled as soon as possible after surgery and take the medication as directed. 14. Remove nail polish on the operative foot. 15. Wash the night before surgery.  The night before surgery wash the foot and leg well with the antibacterial soap provided and water paying special attention to beneath the toenails and in between the toes.  Rinse thoroughly with water and dry well with a towel.  Perform this wash unless told not to do so by your physician.  Enclosed: 1 Ice pack (please put in freezer the night before surgery)   1 Hibiclens skin cleaner   Pre-op Instructions  If you have any questions regarding the instructions, do not hesitate to call our office.  [place]: [address] [place], [place] [address] [phone]  [place]: [address] [place], [place] [address] [phone]  [place]: [address]  [place], [place] [address] [phone]  Dr. Magnus   Tuchman DPM, Dr. Norman Regal DPM Dr. Feliciano Sikora DPM, Dr. M. Todd Hyatt DPM, Dr. Kathryn Egerton DPM, Dr. Matthew Wagoner DPM 

## 2015-07-30 NOTE — Telephone Encounter (Signed)
This needs to be reviewed by Dr. Gwenlyn Found

## 2015-07-30 NOTE — Telephone Encounter (Signed)
Dr. Jacqualyn Posey seeking clearance to interrupt pt's Plavix, ASA starting today for a 3rd left toe amputation scheduled for Wednesday 08/04/15. Confirmed callback number w/ surgical scheduler. Routing to Dr. Gwenlyn Found to advise.

## 2015-07-30 NOTE — Telephone Encounter (Signed)
I'm calling on behalf of Dr. Jacqualyn Posey.  He wants to know if he can stop patient's Plavix starting today.  Patient is scheduled to have an amputation of his 3rd toe left foot on Wednesday of next week.  "I've got all the information.  I have to get clearance from Dr. Gwenlyn Found.  I will ask about the Aspirin as well.  You should hear back from me with a response sometime today."

## 2015-07-30 NOTE — Telephone Encounter (Signed)
I called and left Renee, [facility] a message that Dr. Jacqualyn Posey is adding this surgery on for Wednesday, 08/04/2015.  I faxed all needed information to [facility] for surgery.

## 2015-07-31 NOTE — Telephone Encounter (Signed)
That's fir with me to interrupt antiplatelet therapy for tow amputation.  JJB

## 2015-08-02 ENCOUNTER — Telehealth: Payer: Self-pay | Admitting: *Deleted

## 2015-08-02 ENCOUNTER — Encounter: Payer: Self-pay | Admitting: Podiatry

## 2015-08-02 NOTE — Telephone Encounter (Signed)
I'm calling to inform you that Dr. Gwenlyn Found said it's okay for you to stop the Plavix prior to surgery.  "Okay, I'll stop it.  Will it be okay for me to still have the surgery?  I'll only be off it for 2 days."  Yes, Dr. Jacqualyn Posey said you would be okay.  "Okay, thank you."

## 2015-08-02 NOTE — Telephone Encounter (Signed)
"  We spoke on Friday.  I'm calling from Dr. Kennon Holter office regarding clearance for patient.  I did not hear back from Dr. Gwenlyn Found on Friday.  He did get back to me over the weekend.  He said it is fine to interrupt the antiplatelet for amputation of patient's toe.  There is a short note stating Dr. Gwenlyn Found gave clearance.  He does have official clearance from [place].  Give [place] a call if you need anything."

## 2015-08-02 NOTE — Telephone Encounter (Signed)
LM for Delydia informing of note in EPIC, & to return call if questions.

## 2015-08-02 NOTE — Progress Notes (Signed)
Patient ID: Timothy [name], male   DOB: 02-22-1928, 81 y.o.   MRN: [id number]  Subjective: 81 year old male presents the office today for ulceration to the left third toe with exposed bone. Since last appointment he underwent intervention with Dr. Gwenlyn Found. At this time is requesting amputation of his left third toe due to continued ulceration and exposed bone. Now that his circulation is improved he elected to proceed with a beautician. He denies any drainage or purulence from the toe and denies any significant redness or any red streaks. He denies any systemic complaints as fevers, chills, nausea, vomiting. No calf pain, chest pain, shortness of breath. No other complaints at this time.  Objective: AAO 3, NAD DP/PT pulses decreased Protective sensation decreased with Simms Weinstein monofilament There has been previous amputation of the hallux and second digit of the left foot. Hammertoe contractures of the remaining digits. The distal aspect of the left third toe there is an ulceration with exposed bone. Bone is visualized to the wound. There is no drainage or purulence. There is localized edema and erythema to the distal aspect of the digit. There is not appear to be any erythema or edema along the proximal digit. There are no other open lesions or pre-ulcerative lesions. No pain with calf compression, swelling, warmth, erythema.  Assessment: 81 year old male with left third toe ulceration with exposed bone  Plan: -I recommended repeat x-rays however the patient does not want x-rays at this time. -Treatment options discussed including all alternatives, risks, and complications -At this time the patient is requesting surgery to remove his left third toe. I discussed with him a partial versus total toe amputation. I will try to do only a partial toe amputation if possible to help prevent further digital deformity to the remaining digits. -The incision placement as well as the postoperative course  was discussed with the patient. I discussed risks of the surgery which include, but not limited to, infection, bleeding, pain, swelling, need for further surgery, delayed or nonhealing, painful or ugly scar, numbness or sensation changes, over/under correction, recurrence, transfer lesions, further deformity, hardware failure, DVT/PE, loss of toe/foot, transfer lesions, further amputation. Patient understands these risks and wishes to proceed with surgery. The surgical consent was reviewed with the patient all 3 pages were signed. No promises or guarantees were given to the outcome of the procedure. All questions were answered to the best of my ability. Before the surgery the patient was encouraged to call the office if there is any further questions. The surgery will be performed at the [facility] on an outpatient basis. Surgery will be on Wednesday 08/04/15.   Celesta Gentile, DPM

## 2015-08-02 NOTE — Telephone Encounter (Addendum)
Pt's wife, [name] asked if Dr. Gwenlyn Found had recommended pt stop the Plavix prior to surgery.  Surgery Coordinator - Mammie Russian states she contacted pt with the Plavix instructions.

## 2015-08-03 ENCOUNTER — Ambulatory Visit (HOSPITAL_BASED_OUTPATIENT_CLINIC_OR_DEPARTMENT_OTHER): Payer: Medicare Other

## 2015-08-03 ENCOUNTER — Telehealth: Payer: Self-pay | Admitting: Oncology

## 2015-08-03 ENCOUNTER — Ambulatory Visit (HOSPITAL_BASED_OUTPATIENT_CLINIC_OR_DEPARTMENT_OTHER): Payer: Medicare Other | Admitting: Oncology

## 2015-08-03 ENCOUNTER — Ambulatory Visit: Payer: Medicare Other

## 2015-08-03 ENCOUNTER — Other Ambulatory Visit: Payer: Medicare Other

## 2015-08-03 VITALS — BP 136/53 | HR 84 | Temp 97.4°F | Resp 18 | Ht 70.0 in | Wt 183.7 lb

## 2015-08-03 DIAGNOSIS — C9000 Multiple myeloma not having achieved remission: Secondary | ICD-10-CM | POA: Diagnosis present

## 2015-08-03 DIAGNOSIS — Z5111 Encounter for antineoplastic chemotherapy: Secondary | ICD-10-CM

## 2015-08-03 DIAGNOSIS — D630 Anemia in neoplastic disease: Secondary | ICD-10-CM | POA: Diagnosis not present

## 2015-08-03 MED ORDER — SODIUM CHLORIDE 0.9 % IV SOLN
Freq: Once | INTRAVENOUS | Status: AC
  Administered 2015-08-03: 13:00:00 via INTRAVENOUS

## 2015-08-03 MED ORDER — SODIUM CHLORIDE 0.9 % IV SOLN
300.0000 mg/m2 | Freq: Once | INTRAVENOUS | Status: AC
  Administered 2015-08-03: 620 mg via INTRAVENOUS
  Filled 2015-08-03: qty 31

## 2015-08-03 MED ORDER — HEPARIN SOD (PORK) LOCK FLUSH 100 UNIT/ML IV SOLN
250.0000 [IU] | Freq: Once | INTRAVENOUS | Status: AC | PRN
  Administered 2015-08-03: 250 [IU]
  Filled 2015-08-03: qty 5

## 2015-08-03 MED ORDER — DARBEPOETIN ALFA 300 MCG/0.6ML IJ SOSY
300.0000 ug | PREFILLED_SYRINGE | Freq: Once | INTRAMUSCULAR | Status: AC
  Administered 2015-08-03: 300 ug via SUBCUTANEOUS
  Filled 2015-08-03: qty 0.6

## 2015-08-03 MED ORDER — HYDROMORPHONE HCL 4 MG PO TABS: 4.0000 mg | ORAL_TABLET | ORAL | Status: DC | PRN

## 2015-08-03 MED ORDER — ACYCLOVIR 400 MG PO TABS: 400.0000 mg | ORAL_TABLET | Freq: Two times a day (BID) | ORAL | Status: DC

## 2015-08-03 MED ORDER — SODIUM CHLORIDE 0.9 % IV SOLN
Freq: Once | INTRAVENOUS | Status: AC
  Administered 2015-08-03: 13:00:00 via INTRAVENOUS
  Filled 2015-08-03: qty 4

## 2015-08-03 MED ORDER — SODIUM CHLORIDE 0.9 % IJ SOLN
10.0000 mL | INTRAMUSCULAR | Status: DC | PRN
  Administered 2015-08-03: 10 mL
  Filled 2015-08-03: qty 10

## 2015-08-03 NOTE — Telephone Encounter (Signed)
Appointments cancelled per pof and patients wife is aware that he is creating new orders and she will get a new schedule 9/20 as he will still need a flush  anne

## 2015-08-03 NOTE — Patient Instructions (Addendum)
[facility] Discharge Instructions for Patients Receiving Chemotherapy  Today you received the following chemotherapy agents Cytoxan.  To help prevent nausea and vomiting after your treatment, we encourage you to take your nausea medication as needed.   If you develop nausea and vomiting that is not controlled by your nausea medication, call the clinic.   BELOW ARE SYMPTOMS THAT SHOULD BE REPORTED IMMEDIATELY:  *FEVER GREATER THAN 100.5 F  *CHILLS WITH OR WITHOUT FEVER  NAUSEA AND VOMITING THAT IS NOT CONTROLLED WITH YOUR NAUSEA MEDICATION  *UNUSUAL SHORTNESS OF BREATH  *UNUSUAL BRUISING OR BLEEDING  TENDERNESS IN MOUTH AND THROAT WITH OR WITHOUT PRESENCE OF ULCERS  *URINARY PROBLEMS  *BOWEL PROBLEMS  UNUSUAL RASH Items with * indicate a potential emergency and should be followed up as soon as possible.  Feel free to call the clinic you have any questions or concerns. The clinic phone number is [phone].  Please show the [facility] at check-in to the Emergency Department and triage nurse.

## 2015-08-03 NOTE — Progress Notes (Signed)
ID: Timothy [name]   DOB: January 31, 1928  MR#: [id number]  MOQ#:[id number]  PCP:  SU: OTHER MD: Dorna Leitz, Alysia Penna, Georgia Duff, Geryl Councilman  CHIEF COMPLAINT: multiple myeloma  CURRENT THERAPY: cyclophosphamide, darbepoetin   MYELOMA HISTORY: From the earlier summary:  Patient presented in April 2007 with symptomatic slowly progressive anemia, with normal iron parameters, folic acid, and vitamin B12. Plans were made to obtain bone marrow aspirate and biopsy as an outpatient, however the patient was hospitalized shortly thereafter with a creatinine of 8.6. Renal biopsy demonstrated the presence of  light chain deposition disease. Bone marrow biopsy demonstrated 37% plasma cells. A skeletal survey showed small calvarial lytic lesions, mild osteopenia, and cervical spondylosis. With a well-established diagnosis of light-chain multiple myeloma Dr. Melodie Bouillon subsequent treatments are as detailed below.   INTERVAL HISTORY: Timothy [name] returns today for follow up of his multiple myeloma accompanied by his wife Timothy [name]. He continues on cyclophosphamide every 2 weeks, and dexamethasone every week. He is tolerating these medications with no side effects that he is aware of. In particular his counts have been stable on the cyclophosphamide and he has not developed thrush or other locations from the dexamethasone. He had a PICC line placed and Timothy [name] is flushing it 3 times a week as instructed.  REVIEW OF SYSTEMS: Timothy [name] is going to lose yet another toe from his left foot tomorrow. He is now on Plavix and just hitting something likely will cause significant bruising. Although he is very fatigued and he is still trying to do some gardening. He has great difficulty walking but uses a walker very appropriately. He is concerned regarding falls and again that is an appropriate concern. He has had no intercurrent fevers, no thrush, no cough or  phlegm production, and despite his congestive heart failure no chest pain or pressure. He has peripheral edema as before without erythema. There has been no fever. A detailed review of systems today was otherwise stable  PAST MEDICAL HISTORY: Past Medical History  Diagnosis Date  . ASCVD (arteriosclerotic cardiovascular disease)   . Gout   . Hypertension   . Lumbar disc disease     post laminectomy  . History of echocardiogram 11/02/2010    EF range of 30 to 35% / There is hypokinsesis of the basal-mild inferolateral myocardium  . CHF (congestive heart failure)   . Heart murmur   . Ischemic cardiomyopathy   . Renal insufficiency   . Peripheral neuropathy   . Hyperlipidemia   . SOB (shortness of breath)   . Fatigue   . Peripheral neuropathy   . Coronary artery disease     CABG 2002 by Dr. Roxy Manns with LIMA to LAD, SVG to intermediate, SVG to LCX, & SVG to PL. Last nuclear in 2012 showing large inferior scar with EF of 39%.   . Blood transfusion 10/2011; 07/2013    "multiple myeloma; when I broke my leg"  . History of shingles MARCH 2009    SHINGLE LESIONS WERE AROUND RIGHT EYE--PT HAS RESIDUAL ITCHING AROUND THE EYE.  . Osteomyelitis     s/p toe amputation  . Chronic systolic heart failure     EF of 30% per echo 02/2012  . Protein malnutrition   . Critical lower limb ischemia   . Peripheral vascular disease   . Hx of cardiovascular stress test     Lexiscan Myoview (1/16):  Large inferior and apical scar; no ischemia, EF 27%;  High Risk  .  Inferior myocardial infarction 1986  . Pulmonary embolism     "when I had big toe amputated"  . Chronic anemia     "due to the multiple myeloma"  . Chronic lower back pain   . Multiple myeloma dx'd 04/2006    with recurrence of increasing problems  DR. Fisher -ONCOLOGIST.  PT HAS BEEN OFF CHEMO SINCE HIS HOSPITALIZATION FEB 2013 FOR LEFT FOOT INFECTION  . Basal cell carcinoma     "chest left arm"   PAST SURGICAL HISTORY: Past Surgical  History  Procedure Laterality Date  . Lumbar laminectomy    . Cardiac catheterization  06/20/2000    Severe coronary disease (totally occluded right artery, 90% left circumflex, 50-60% intermediate, and 90-95% ostial left anterior descending)   . Coronary artery bypass graft  2002    x4 / Left internal mammary artery to the LAD.  / Saphenous vein graft to the right posterolateral.  /  Saphenous vein graft to  the ramus intermedius. / Saphenous vein graft to the circumflex marginal     . Amputation  03/06/2012    Procedure: AMPUTATION RAY;  Surgeon: Alta Corning, MD;  Location: [facility];  Service: Orthopedics;  Laterality: Left;  First Ray Amputation  . Intramedullary (im) nail intertrochanteric Right 08/17/2013    Procedure: INTRAMEDULLARY (IM) NAIL INTERTROCHANTRIC;  Surgeon: Marybelle Killings, MD;  Location: [facility];  Service: Orthopedics;  Laterality: Right;  . Endarterectomy femoral Left 03/24/2014    Procedure: Left Common Femoral  Artery Endarterectomy with Vein Patch Angioplasty,  Ultrasound ;  Surgeon: Angelia Mould, MD;  Location: [place];  Service: Vascular;  Laterality: Left;  . Cataract extraction w/ intraocular lens  implant, bilateral Bilateral   . Lower extremity angiogram N/A 03/23/2014    Procedure: LOWER EXTREMITY ANGIOGRAM;  Surgeon: Lorretta Harp, MD;  Location: [facility];  Service: Cardiovascular;  Laterality: N/A;  . Peripheral vascular catheterization N/A 05/27/2015    Procedure: Lower Extremity Angiography;  Surgeon: Lorretta Harp, MD;  Location: [place] [facility];  Service: Cardiovascular;  Laterality: N/A;  . Peripheral vascular catheterization  06/03/2015    Procedure: Peripheral Vascular Intervention;  Surgeon: Lorretta Harp, MD;  Location: [place] [facility];  Service: Cardiovascular;;  Lt Common Iliac  . Tonsillectomy  ~ 1936  . Inguinal hernia repair Bilateral   . Fracture surgery    . Back surgery    . Basal cell carcinoma excision      "chest left arm"  .  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 He and his wife Timothy [name] are the only ones at home.    ADVANCED DIRECTIVES: Timothy [name] has wife is his healthcare part of attorney  HEALTH MAINTENANCE: Social History  Substance Use Topics  . Smoking status: Former Smoker -- 1.00 packs/day for 20 years    Types: Cigarettes    Quit date: 11/20/1984  . Smokeless tobacco: Never Used  . Alcohol Use: Yes     Comment: 06/03/2015 "might have a drink a few times/year"     Colonoscopy:  Bone density:  Lipid panel:  Allergies  Allergen Reactions  . Morphine And Related Other (See Comments)    Agitated with morphine drip    Current Outpatient Prescriptions  Medication Sig Dispense Refill  . acyclovir (ZOVIRAX) 400 MG tablet Take  1 tablet (400 mg total) by mouth 2 (two) times daily. 60 tablet 2  . allopurinol (ZYLOPRIM) 300 MG tablet TAKE ONE-HALF (1/2) TABLET DAILY 90 tablet 2  . aspirin 81 MG chewable tablet Chew 1 tablet (81 mg total) by mouth daily.    Marland Kitchen atorvastatin (LIPITOR) 10 MG tablet Take 1 tablet (10 mg total) by mouth every evening. 90 tablet 3  . B Complex-Biotin-FA (B COMPLETE PO) Take 1 tablet by mouth daily.     . cadexomer iodine (IODOSORB) 0.9 % gel Apply 1 application topically daily.    . carvedilol (COREG) 6.25 MG tablet Take 1 tablet (6.25 mg total) by mouth 2 (two) times daily with a meal. 180 tablet 3  . clopidogrel (PLAVIX) 75 MG tablet Take 1 tablet (75 mg total) by mouth daily with breakfast. 90 tablet 3  . Coenzyme Q10 (CO Q-10 PO) Take 1 tablet by mouth every morning.     . darbepoetin (ARANESP) 200 MCG/0.4ML SOLN injection Inject 200 mcg into the skin as directed.    . diphenoxylate-atropine (LOMOTIL) 2.5-0.025 MG per tablet Take 1 tablet by mouth 4 (four) times daily as needed for  diarrhea or loose stools. 30 tablet 0  . furosemide (LASIX) 40 MG tablet Take 1 tablet (40 mg total) by mouth daily. 90 tablet 1  . Heparin Lock Flush (HEPARIN FLUSH, PORCINE,) 100 UNIT/ML injection 2.9m ( 250 ) per picc line flush. Use 1 syringe per port. 30 Syringe 3  . HYDROmorphone (DILAUDID) 4 MG tablet Take 1 tablet (4 mg total) by mouth every 4 (four) hours as needed for severe pain. 120 tablet 0  . isosorbide mononitrate (IMDUR) 30 MG 24 hr tablet Take 1 tablet (30 mg total) by mouth daily. 90 tablet 3  . lisinopril (PRINIVIL,ZESTRIL) 20 MG tablet TAKE 1 TABLET DAILY 90 tablet 2  . Multiple Vitamin (MULTIVITAMIN) capsule Take 1 capsule by mouth every morning.     . nitroGLYCERIN (NITROSTAT) 0.4 MG SL tablet Place 1 tablet (0.4 mg total) under the tongue every 5 (five) minutes as needed for chest pain. 25 tablet 3  . ondansetron (ZOFRAN) 8 MG tablet Take 1 tablet (8 mg total) by mouth 2 (two) times daily as needed (Nausea or vomiting). 30 tablet 1  . polyethylene glycol (MIRALAX / GLYCOLAX) packet Take 17 g by mouth daily as needed. 14 each 0  . rOPINIRole (REQUIP) 3 MG tablet Take 1 tablet (3 mg total) by mouth at bedtime. 90 tablet 4  . silver sulfADIAZINE (SSD) 1 % cream Apply topically daily. 50 g 0  . sodium chloride 0.9 % SOLN 250 mL with cyclophosphamide 1 G SOLR 300 mg/m2 Inject 300 mg/m2 into the vein every 14 (fourteen) days.    . Sodium Chloride Flush 0.9 % SOLN injection Inject 10 mLs into the vein as directed. 30 Syringe 3  . tamsulosin (FLOMAX) 0.4 MG CAPS capsule Take 0.4 mg by mouth at bedtime.    .Marland Kitchentobramycin (TOBREX) 0.3 % ophthalmic solution Place 1 drop into both eyes daily as needed.  4  . vitamin C (ASCORBIC ACID) 500 MG tablet Take 250 mg by mouth 2 (two) times daily.     . Zoledronic Acid (ZOMETA IV) Inject 1 each into the vein once.     No current facility-administered medications for this visit.    Objective: Older white man who walking with a walker Filed  Vitals:   08/03/15 1150  BP: 136/53  Pulse: 84  Temp: 97.4 F (36.3  C)  Resp: 18        Body mass index is 26.36 kg/(m^2).    ECOG FS: 2 Filed Weights   08/03/15 1150  Weight: 183 lb 11.2 oz (83.326 kg)   Sclerae unicteric, EOMs intact Oropharynx clear, dentition in good repair No cervical or supraclavicular adenopathy Lungs no rales or rhonchi Heart regular rate and rhythm Abd soft, nontender, positive bowel sounds MSK no focal spinal tenderness, left foot not examined Neuro: nonfocal, well oriented, appropriate affect    LAB RESULTS:  Results for ROLONDO, PIERRE (MRN [id number]) as of 08/04/2015 09:40  Ref. Range 04/09/2015 10:21 04/23/2015 10:28 05/07/2015 10:30 06/22/2015 11:48 07/06/2015 12:07  Kappa free light chain Latest Ref Range: 0.33-1.94 mg/dL 17.30 (H)  45.30 (H)  70.50 (H)  Lambda Free Lght Chn Latest Ref Range: 0.57-2.63 mg/dL 0.47 (L)  1.37  2.33  Kappa:Lambda Ratio Latest Ref Range: 0.26-1.65  36.81 (H)  33.07 (H)  30.26 (H)    Lab Results  Component Value Date   WBC 9.2 07/27/2015   NEUTROABS 7.2* 07/27/2015   HGB 9.2* 07/27/2015   HCT 28.9* 07/27/2015   MCV 90.6 07/27/2015   PLT 161 07/27/2015      Chemistry      Component Value Date/Time   NA 137 07/27/2015 1129   NA 139 06/04/2015 0511   K 4.6 07/27/2015 1129   K 4.4 06/04/2015 0511   CL 110 06/04/2015 0511   CL 98 05/12/2013 1048   CO2 23 07/27/2015 1129   CO2 22 06/04/2015 0511   BUN 21.1 07/27/2015 1129   BUN 31* 06/04/2015 0511   CREATININE 1.3 07/27/2015 1129   CREATININE 1.25* 06/04/2015 0511      Component Value Date/Time   CALCIUM 9.1 07/27/2015 1129   CALCIUM 8.3* 06/04/2015 0511   ALKPHOS 45 07/27/2015 1129   ALKPHOS 42 03/25/2014 2115   AST 26 07/27/2015 1129   AST 25 03/25/2014 2115   ALT 17 07/27/2015 1129   ALT 10 03/25/2014 2115   BILITOT 0.74 07/27/2015 1129   BILITOT 0.7 03/25/2014 2115      STUDIES: Ir Fluoro Guide Cv Line Right  07/06/2015   CLINICAL DATA:   Multiple myeloma. Poor peripheral venous access, needs access for chemotherapy.  EXAM: PICC PLACEMENT WITH ULTRASOUND AND FLUOROSCOPY  FLUOROSCOPY TIME:  54 seconds, 11 mGy  TECHNIQUE: After written informed consent was obtained, patient was placed in the supine position on angiographic table. Patency of the right basilic vein was confirmed with ultrasound with image documentation. An appropriate skin site was determined. Skin site was marked. Region was prepped using maximum barrier technique including cap and mask, sterile gown, sterile gloves, large sterile sheet, and Chlorhexidine as cutaneous antisepsis. The region was infiltrated locally with 1% lidocaine. Under real-time ultrasound guidance, the right basilic vein was accessed with a 21 gauge micropuncture needle; the needle tip within the vein was confirmed with ultrasound image documentation. Needle exchanged over a 018 guidewire for a peel-away sheath, through which a 5-French double-lumen power injectable PICC trimmed to 38cm was advanced, positioned with its tip near the cavoatrial junction. Spot chest radiograph confirms appropriate catheter position. Catheter was flushed per protocol and secured externally. The patient tolerated procedure well.  COMPLICATIONS: COMPLICATIONS none  IMPRESSION: 1. Technically successful five [place] double lumen power injectable PICC placement   Electronically Signed   By: Lucrezia Europe M.D.   On: 07/06/2015 14:25   Ir US Guide Vasc Access Right  07/06/2015  CLINICAL DATA:  Multiple myeloma. Poor peripheral venous access, needs access for chemotherapy.  EXAM: PICC PLACEMENT WITH ULTRASOUND AND FLUOROSCOPY  FLUOROSCOPY TIME:  54 seconds, 11 mGy  TECHNIQUE: After written informed consent was obtained, patient was placed in the supine position on angiographic table. Patency of the right basilic vein was confirmed with ultrasound with image documentation. An appropriate skin site was determined. Skin site was marked. Region  was prepped using maximum barrier technique including cap and mask, sterile gown, sterile gloves, large sterile sheet, and Chlorhexidine as cutaneous antisepsis. The region was infiltrated locally with 1% lidocaine. Under real-time ultrasound guidance, the right basilic vein was accessed with a 21 gauge micropuncture needle; the needle tip within the vein was confirmed with ultrasound image documentation. Needle exchanged over a 018 guidewire for a peel-away sheath, through which a 5-French double-lumen power injectable PICC trimmed to 38cm was advanced, positioned with its tip near the cavoatrial junction. Spot chest radiograph confirms appropriate catheter position. Catheter was flushed per protocol and secured externally. The patient tolerated procedure well.  COMPLICATIONS: COMPLICATIONS none  IMPRESSION: 1. Technically successful five [place] double lumen power injectable PICC placement   Electronically Signed   By: Lucrezia Europe M.D.   On: 07/06/2015 14:25     ASSESSMENT: 81 y.o.  with kappa light chain multiple myeloma   (1) presenting April 2007 with anemia and renal failure; renal Bx showing free kappa light chain deposition; bone marrow biopsy showing 37% plasma cells, with normal cytogenetics and FISH;bone survey showing skull lytic lesions; treated with   (2) thalidomide (200 mg/d) and dexamethasone (40 mg/d x4d Q28d) June through Nov 2007, with good response, but poor tolerance;   (3) thalidomide decreased to 100 mg/d, dexamethasone continued, bortezomib (IV) added, Dec 2007 to March 2008   (4) treatment interrupted by multiple complications (peripheral neuropathy, pulmonary embolism, diverticular abscess, CN V zoster, severe DDD, congestive heart failure, rising PSA); maintenance zolendronic acid through March 2012   (5) progression April 2012, treated initially with dexamethasone alone, poorly tolerated;   (6) bortezomib (sq) resumed July 2012; cyclophosphamide and dexamethasone added Sept  2012; zolendronic acid changed to Q 59month; all treatments held as of March 2013 due to the development of the Left foot ulcer described above  (7) anemia, on aranesp December 2012 to August 2013; resumed Q14d starting May 2015, with slowly rising reticulocyte count  (8) status post Right trochanteric nail with proximal and distal interlock DePuy 11 mm one-pin lag screw, 44 mm distal interlock 08/09/2013 for a right comminuted intertrochanteric fracture  (9) status post removal of the first 2 toes left foot  (10) Left common femoral artery endarterectomy with vein patch angioplasty using left greater saphenous vein 03/24/2014  (11) status post fall with injury to the right hip, large hematoma[facility]  (12) on 10/09/2014 starting cyclophosphamide at 300 mg/m every 14 days and dexamethasone 20 mg daily 2 days each week  (13) SIADH: On fluid restriction, reduced to 1.5L on 12/04/14 because of drop in sodium  (14) chronic systolic congestive heart failure, with EF  35% per echo, with history CAD s/p CABG 2002  (15) CKD stage III  (16) peripheral vascular disease, status post outpatient stage diamondback orbital rotational atherectomy along with PTCA and placement of Viabahn covered stent 06/03/2015 for high grade calcified prox L common illiac artery stenosis noted on cath 05/27/2015  (17) poor access: Status post right-sided PICC placement 07/06/2015  PLAN:   DBarbarann Ehlerscontinues  to decline slowly. Meanwhile his multiple myeloma is well-controlled. At this point I am beginning to be concerned that the medications we are using to control the myeloma may interfere or worsen some of his other more acute problems.  Accordingly we are stopping the weekly dexamethasone at this point. That could interfere with wound healing and compromise his immune system. I am also going to move the cyclophosphamide to every 3 weeks instead of every 2 weeks. I  suspect there will be very little change in his multiple myeloma control with these simple changes and we will be following his counts closely. If there is progression we can always intensify therapy again.  He has a good understanding of this plan. He knows to call for any problems that may develop before his next visit here. Ova Meegan C    08/04/2015

## 2015-08-04 ENCOUNTER — Telehealth: Payer: Self-pay | Admitting: Oncology

## 2015-08-04 ENCOUNTER — Encounter: Payer: Self-pay | Admitting: *Deleted

## 2015-08-04 ENCOUNTER — Other Ambulatory Visit: Payer: Self-pay | Admitting: Nurse Practitioner

## 2015-08-04 ENCOUNTER — Ambulatory Visit: Payer: Medicare Other | Admitting: Oncology

## 2015-08-04 ENCOUNTER — Other Ambulatory Visit: Payer: Medicare Other

## 2015-08-04 DIAGNOSIS — M86172 Other acute osteomyelitis, left ankle and foot: Secondary | ICD-10-CM | POA: Diagnosis not present

## 2015-08-04 NOTE — Telephone Encounter (Signed)
Appointments made per pof,i went ahead and added in md visits that can later be cancelled if needed,patients wife stated she will get a new schedule

## 2015-08-07 ENCOUNTER — Other Ambulatory Visit: Payer: Self-pay | Admitting: Nurse Practitioner

## 2015-08-09 ENCOUNTER — Ambulatory Visit (INDEPENDENT_AMBULATORY_CARE_PROVIDER_SITE_OTHER): Payer: Medicare Other

## 2015-08-09 ENCOUNTER — Encounter: Payer: Self-pay | Admitting: Podiatry

## 2015-08-09 ENCOUNTER — Ambulatory Visit (INDEPENDENT_AMBULATORY_CARE_PROVIDER_SITE_OTHER): Payer: Medicare Other | Admitting: Podiatry

## 2015-08-09 DIAGNOSIS — Z89422 Acquired absence of other left toe(s): Secondary | ICD-10-CM

## 2015-08-09 DIAGNOSIS — S98132A Complete traumatic amputation of one left lesser toe, initial encounter: Secondary | ICD-10-CM

## 2015-08-09 DIAGNOSIS — Z9889 Other specified postprocedural states: Secondary | ICD-10-CM

## 2015-08-09 NOTE — Progress Notes (Signed)
Patient ID: Timothy [name], male   DOB: 03-11-1928, 81 y.o.   MRN: [id number]  DOS: 08/04/15 s/p Left partial 3rd toe amputation  Subjective: 81 year old male presents the office today one week status post left partial third toe amputation. He states that he is doing well and his pain is controlled. His continue with surgical shoe. He denies any drainage, pain is. Denies any systemic complaints as fevers, chills, nausea, vomiting. No calf pain, chest pain, terms of breath. No other complaints at this time.  Objective: AAO 3, NAD DP/PT pulses palpable, CRT less than 3 seconds Protective sensation decreased with Semmes-Weinstein monofilament Incision from partial third toe amputation is well coapted without any evidence of dehiscence and sutures are intact. There is no swelling erythema, ascending cellulitis, fluctuance, crepitus, malodor. There is no drainage from the incision. There does appear to be some ecchymosis along the dorsal fourth digit. No other open lesions or pre-ulcerative lesions. There is no pain with calf compression, swelling, warmth, erythema.  Assessment: 81 year old male status post partial third toe left  Plan: -X-rays were obtained and reviewed with the patient.  -Treatment options discussed including all alternatives, risks, and complications -Antibiotic ointment was placed over the incision followed by dry sterile dressing. Keep dressing clean, dry, intact.  -Continue a surgical shoe. -Monitor for any clinical signs or symptoms of infection and directed to call the office immediately should any occur or go to the ER. -Follow-up 1 week for likely suture removal or sooner if any problems arise. In the meantime, encouraged to call the office with any questions, concerns, change in symptoms.   Celesta Gentile, DPM

## 2015-08-10 ENCOUNTER — Ambulatory Visit (HOSPITAL_BASED_OUTPATIENT_CLINIC_OR_DEPARTMENT_OTHER): Payer: Medicare Other

## 2015-08-10 DIAGNOSIS — Z452 Encounter for adjustment and management of vascular access device: Secondary | ICD-10-CM | POA: Diagnosis present

## 2015-08-10 DIAGNOSIS — C9000 Multiple myeloma not having achieved remission: Secondary | ICD-10-CM | POA: Diagnosis not present

## 2015-08-10 DIAGNOSIS — Z95828 Presence of other vascular implants and grafts: Secondary | ICD-10-CM

## 2015-08-10 MED ORDER — HEPARIN SOD (PORK) LOCK FLUSH 100 UNIT/ML IV SOLN
500.0000 [IU] | Freq: Once | INTRAVENOUS | Status: AC
  Administered 2015-08-10: 500 [IU] via INTRAVENOUS
  Filled 2015-08-10: qty 5

## 2015-08-10 MED ORDER — SODIUM CHLORIDE 0.9 % IJ SOLN
10.0000 mL | INTRAMUSCULAR | Status: DC | PRN
  Administered 2015-08-10: 10 mL via INTRAVENOUS
  Filled 2015-08-10: qty 10

## 2015-08-10 NOTE — Patient Instructions (Signed)

## 2015-08-11 ENCOUNTER — Telehealth: Payer: Self-pay | Admitting: *Deleted

## 2015-08-11 NOTE — Progress Notes (Signed)
Surgery performed at [facility] for Amputation Toe MPJ Joint 3rd left foot.  Prescription given, Keflex 500mg , quantity 21.

## 2015-08-11 NOTE — Telephone Encounter (Signed)
I'm calling to see how you are doing after surgery.  "Everything is going good.  I'm keeping it elevated as much as I can.  Everything was good at the surgical center except they were slow.  They were running behind.  Other than that everything is good."

## 2015-08-13 ENCOUNTER — Encounter: Payer: Self-pay | Admitting: Podiatry

## 2015-08-16 ENCOUNTER — Encounter: Payer: Medicare Other | Admitting: Podiatry

## 2015-08-17 ENCOUNTER — Ambulatory Visit (HOSPITAL_BASED_OUTPATIENT_CLINIC_OR_DEPARTMENT_OTHER): Payer: Medicare Other

## 2015-08-17 ENCOUNTER — Ambulatory Visit: Payer: Medicare Other

## 2015-08-17 ENCOUNTER — Other Ambulatory Visit: Payer: Medicare Other

## 2015-08-17 DIAGNOSIS — Z452 Encounter for adjustment and management of vascular access device: Secondary | ICD-10-CM

## 2015-08-17 DIAGNOSIS — C9000 Multiple myeloma not having achieved remission: Secondary | ICD-10-CM

## 2015-08-17 MED ORDER — HEPARIN SOD (PORK) LOCK FLUSH 100 UNIT/ML IV SOLN
500.0000 [IU] | Freq: Once | INTRAVENOUS | Status: AC
  Administered 2015-08-17: 250 [IU] via INTRAVENOUS
  Filled 2015-08-17: qty 5

## 2015-08-17 MED ORDER — SODIUM CHLORIDE 0.9 % IJ SOLN
10.0000 mL | INTRAMUSCULAR | Status: DC | PRN
  Administered 2015-08-17: 10 mL via INTRAVENOUS
  Filled 2015-08-17: qty 10

## 2015-08-17 NOTE — Patient Instructions (Signed)
PICC Home Guide A peripherally inserted central catheter (PICC) is a long, thin, flexible tube that is inserted into a vein in the upper arm. It is a form of intravenous (IV) access. It is considered to be a "central" line because the tip of the PICC ends in a large vein in your chest. This large vein is called the superior vena cava (SVC). The PICC tip ends in the SVC because there is a lot of blood flow in the SVC. This allows medicines and IV fluids to be quickly distributed throughout the body. The PICC is inserted using a sterile technique by a specially trained nurse or physician. After the PICC is inserted, a chest X-ray exam is done to be sure it is in the correct place.  A PICC may be placed for different reasons, such as:  To give medicines and liquid nutrition that can only be given through a central line. Examples are:  Certain antibiotic treatments.  Chemotherapy.  Total parenteral nutrition (TPN).  To take frequent blood samples.  To give IV fluids and blood products.  If there is difficulty placing a peripheral intravenous (PIV) catheter. If taken care of properly, a PICC can remain in place for several months. A PICC can also allow a person to go home from the hospital early. Medicine and PICC care can be managed at home by a family member or home health care team. WHAT PROBLEMS CAN HAPPEN WHEN I HAVE A PICC? Problems with a PICC can occasionally occur. These may include the following:  A blood clot (thrombus) forming in or at the tip of the PICC. This can cause the PICC to become clogged. A clot-dissolving medicine called tissue plasminogen activator (tPA) can be given through the PICC to help break up the clot.  Inflammation of the vein (phlebitis) in which the PICC is placed. Signs of inflammation may include redness, pain at the insertion site, red streaks, or being able to feel a "cord" in the vein where the PICC is located.  Infection in the PICC or at the insertion  site. Signs of infection may include fever, chills, redness, swelling, or pus drainage from the PICC insertion site.  PICC movement (malposition). The PICC tip may move from its original position due to excessive physical activity, forceful coughing, sneezing, or vomiting.  A break or cut in the PICC. It is important to not use scissors near the PICC.  Nerve or tendon irritation or injury during PICC insertion. WHAT SHOULD I KEEP IN MIND ABOUT ACTIVITIES WHEN I HAVE A PICC?  You may bend your arm and move it freely. If your PICC is near or at the bend of your elbow, avoid activity with repeated motion at the elbow.  Rest at home for the remainder of the day following PICC line insertion.  Avoid lifting heavy objects as instructed by your health care provider.  Avoid using a crutch with the arm on the same side as your PICC. You may need to use a walker. WHAT SHOULD I KNOW ABOUT MY PICC DRESSING?  Keep your PICC bandage (dressing) clean and dry to prevent infection.  Ask your health care provider when you may shower. Ask your health care provider to teach you how to wrap the PICC when you do take a shower.  Change the PICC dressing as instructed by your health care provider.  Change your PICC dressing if it becomes loose or wet. WHAT SHOULD I KNOW ABOUT PICC CARE?  Check the PICC insertion site   daily for leakage, redness, swelling, or pain.  Do not take a bath, swim, or use hot tubs when you have a PICC. Cover PICC line with clear plastic wrap and tape to keep it dry while showering.  Flush the PICC as directed by your health care provider. Let your health care provider know right away if the PICC is difficult to flush or does not flush. Do not use force to flush the PICC.  Do not use a syringe that is less than 10 mL to flush the PICC.  Never pull or tug on the PICC.  Avoid blood pressure checks on the arm with the PICC.  Keep your PICC identification card with you at all  times.  Do not take the PICC out yourself. Only a trained clinical professional should remove the PICC. SEEK IMMEDIATE MEDICAL CARE IF:  Your PICC is accidentally pulled all the way out. If this happens, cover the insertion site with a bandage or gauze dressing. Do not throw the PICC away. Your health care provider will need to inspect it.  Your PICC was tugged or pulled and has partially come out. Do not  push the PICC back in.  There is any type of drainage, redness, or swelling where the PICC enters the skin.  You cannot flush the PICC, it is difficult to flush, or the PICC leaks around the insertion site when it is flushed.  You hear a "flushing" sound when the PICC is flushed.  You have pain, discomfort, or numbness in your arm, shoulder, or jaw on the same side as the PICC.  You feel your heart "racing" or skipping beats.  You notice a hole or tear in the PICC.  You develop chills or a fever. MAKE SURE YOU:   Understand these instructions.  Will watch your condition.  Will get help right away if you are not doing well or get worse. Document Released: 05/13/2003 Document Revised: 03/23/2014 Document Reviewed: 07/14/2013 ExitCare Patient Information 2015 [employer]. This information is not intended to replace advice given to you by your health care provider. Make sure you discuss any questions you have with your health care provider.  

## 2015-08-20 ENCOUNTER — Ambulatory Visit (INDEPENDENT_AMBULATORY_CARE_PROVIDER_SITE_OTHER): Payer: Medicare Other | Admitting: Podiatry

## 2015-08-20 VITALS — BP 142/65 | HR 71 | Resp 16

## 2015-08-20 DIAGNOSIS — Z89422 Acquired absence of other left toe(s): Secondary | ICD-10-CM

## 2015-08-20 DIAGNOSIS — M869 Osteomyelitis, unspecified: Secondary | ICD-10-CM

## 2015-08-20 DIAGNOSIS — Z9889 Other specified postprocedural states: Secondary | ICD-10-CM

## 2015-08-20 DIAGNOSIS — R0989 Other specified symptoms and signs involving the circulatory and respiratory systems: Secondary | ICD-10-CM

## 2015-08-20 DIAGNOSIS — I739 Peripheral vascular disease, unspecified: Secondary | ICD-10-CM

## 2015-08-20 DIAGNOSIS — S98132A Complete traumatic amputation of one left lesser toe, initial encounter: Secondary | ICD-10-CM

## 2015-08-20 DIAGNOSIS — Z09 Encounter for follow-up examination after completed treatment for conditions other than malignant neoplasm: Secondary | ICD-10-CM

## 2015-08-23 ENCOUNTER — Encounter: Payer: Self-pay | Admitting: Podiatry

## 2015-08-23 NOTE — Progress Notes (Signed)
Patient ID: Timothy [name], male   DOB: Oct 02, 1928, 81 y.o.   MRN: [id number]  DOS: 08/04/15 s/p left partial 3rd toe amputation  Subjective: 81 year old male presents the office and his wife 2 weeks status post left partial third toe amputation. He presents today for suture removal. He states his pain is controlled. He has remained in the surgical shoe. Denies any systemic complaints as fevers, chills, nausea, vomiting. No calf pain, chest pain, shortness of breath. No other acute changes since last appointment and no new complaints today.  Objective: AAO 3, NAD Neurovascular status unchanged Incisional the distal aspect of the left third toe is well coapted without any evidence of dehiscence and sutures are intact. There is trace edema overlying the area. There is no swelling erythema, ascending cellulitis, fluctuance, crepitus, malodor, drainage. No other areas of tenderness to bilateral lower extremity is. No other areas of edema. No open lesions or pre-ulcerative lesions. No pain with calf compression, swelling, warmth, erythema.  Assessment: 81 year old male status post left partial third toe imitation, doing well  Plan: -Treatment options discussed including all alternatives, risks, and complications -Every other suture was removed without complications. Given his history of slow healing we'll leave the remainder of the sutures intact for 1 more week. He states that he will have his wife remove the sutures next week. I recommended against this however he states that she has done this before and she is okay doing this. If worsening problems in the incision, to call immediately. Continued antibiotic ointment overlying the incision followed by dry dressing daily. -Continue surgical shoe -Monitor for any clinical signs or symptoms of infection and directed to call the office immediately should any occur or go to the ER. -Follow-up in 2 weeks or sooner if any problems arise. In the meantime,  encouraged to call the office with any questions, concerns, change in symptoms.   Celesta Gentile, DPM

## 2015-08-24 ENCOUNTER — Ambulatory Visit: Payer: Medicare Other

## 2015-08-24 ENCOUNTER — Ambulatory Visit (HOSPITAL_BASED_OUTPATIENT_CLINIC_OR_DEPARTMENT_OTHER): Payer: Medicare Other | Admitting: Oncology

## 2015-08-24 ENCOUNTER — Other Ambulatory Visit (HOSPITAL_BASED_OUTPATIENT_CLINIC_OR_DEPARTMENT_OTHER): Payer: Medicare Other

## 2015-08-24 ENCOUNTER — Ambulatory Visit (HOSPITAL_BASED_OUTPATIENT_CLINIC_OR_DEPARTMENT_OTHER): Payer: Medicare Other

## 2015-08-24 ENCOUNTER — Other Ambulatory Visit: Payer: Medicare Other

## 2015-08-24 VITALS — BP 133/49 | HR 78 | Temp 97.5°F | Resp 20 | Ht 70.0 in | Wt 184.2 lb

## 2015-08-24 DIAGNOSIS — D630 Anemia in neoplastic disease: Secondary | ICD-10-CM

## 2015-08-24 DIAGNOSIS — Z95828 Presence of other vascular implants and grafts: Secondary | ICD-10-CM

## 2015-08-24 DIAGNOSIS — G620 Drug-induced polyneuropathy: Secondary | ICD-10-CM

## 2015-08-24 DIAGNOSIS — I739 Peripheral vascular disease, unspecified: Secondary | ICD-10-CM

## 2015-08-24 DIAGNOSIS — I70209 Unspecified atherosclerosis of native arteries of extremities, unspecified extremity: Secondary | ICD-10-CM

## 2015-08-24 DIAGNOSIS — C9000 Multiple myeloma not having achieved remission: Secondary | ICD-10-CM

## 2015-08-24 DIAGNOSIS — N183 Chronic kidney disease, stage 3 (moderate): Secondary | ICD-10-CM

## 2015-08-24 DIAGNOSIS — L98499 Non-pressure chronic ulcer of skin of other sites with unspecified severity: Principal | ICD-10-CM

## 2015-08-24 DIAGNOSIS — N289 Disorder of kidney and ureter, unspecified: Secondary | ICD-10-CM

## 2015-08-24 DIAGNOSIS — Z5111 Encounter for antineoplastic chemotherapy: Secondary | ICD-10-CM

## 2015-08-24 DIAGNOSIS — M869 Osteomyelitis, unspecified: Secondary | ICD-10-CM

## 2015-08-24 LAB — COMPREHENSIVE METABOLIC PANEL (CC13)
ALBUMIN: 3.4 g/dL — AB (ref 3.5–5.0)
ALK PHOS: 50 U/L (ref 40–150)
ALT: 19 U/L (ref 0–55)
ANION GAP: 7 meq/L (ref 3–11)
AST: 28 U/L (ref 5–34)
BILIRUBIN TOTAL: 0.61 mg/dL (ref 0.20–1.20)
BUN: 16.3 mg/dL (ref 7.0–26.0)
CO2: 23 meq/L (ref 22–29)
CREATININE: 1.1 mg/dL (ref 0.7–1.3)
Calcium: 9 mg/dL (ref 8.4–10.4)
Chloride: 107 mEq/L (ref 98–109)
EGFR: 60 mL/min/{1.73_m2} — ABNORMAL LOW (ref >90)
GLUCOSE: 117 mg/dL (ref 70–140)
Potassium: 4.5 mEq/L (ref 3.5–5.1)
Sodium: 138 mEq/L (ref 136–145)
TOTAL PROTEIN: 6.3 g/dL — AB (ref 6.4–8.3)

## 2015-08-24 LAB — FERRITIN CHCC: FERRITIN: 127 ng/mL (ref 22–316)

## 2015-08-24 LAB — CBC & DIFF AND RETIC
BASO%: 1.7 % (ref 0.0–2.0)
BASOS ABS: 0.1 10*3/uL (ref 0.0–0.1)
EOS%: 4.5 % (ref 0.0–7.0)
Eosinophils Absolute: 0.3 10*3/uL (ref 0.0–0.5)
HEMATOCRIT: 29.7 % — AB (ref 38.4–49.9)
HEMOGLOBIN: 9.4 g/dL — AB (ref 13.0–17.1)
Immature Retic Fract: 6.2 % (ref 3.00–10.60)
LYMPH%: 28.3 % (ref 14.0–49.0)
MCH: 28.5 pg (ref 27.2–33.4)
MCHC: 31.6 g/dL — ABNORMAL LOW (ref 32.0–36.0)
MCV: 90 fL (ref 79.3–98.0)
MONO#: 0.7 10*3/uL (ref 0.1–0.9)
MONO%: 11.3 % (ref 0.0–14.0)
NEUT#: 3.5 10*3/uL (ref 1.5–6.5)
NEUT%: 54.2 % (ref 39.0–75.0)
Platelets: 222 10*3/uL (ref 140–400)
RBC: 3.3 10*6/uL — ABNORMAL LOW (ref 4.20–5.82)
RDW: 19 % — AB (ref 11.0–14.6)
Retic %: 3.14 % — ABNORMAL HIGH (ref 0.80–1.80)
Retic Ct Abs: 103.62 10*3/uL — ABNORMAL HIGH (ref 34.80–93.90)
WBC: 6.5 10*3/uL (ref 4.0–10.3)
lymph#: 1.8 10*3/uL (ref 0.9–3.3)
nRBC: 0 % (ref 0–0)

## 2015-08-24 MED ORDER — SODIUM CHLORIDE 0.9 % IJ SOLN
10.0000 mL | INTRAMUSCULAR | Status: DC | PRN
  Administered 2015-08-24: 10 mL via INTRAVENOUS
  Filled 2015-08-24: qty 10

## 2015-08-24 MED ORDER — SODIUM CHLORIDE 0.9 % IV SOLN
Freq: Once | INTRAVENOUS | Status: AC
  Administered 2015-08-24: 13:00:00 via INTRAVENOUS
  Filled 2015-08-24: qty 4

## 2015-08-24 MED ORDER — HEPARIN SOD (PORK) LOCK FLUSH 100 UNIT/ML IV SOLN
250.0000 [IU] | Freq: Once | INTRAVENOUS | Status: AC | PRN
  Administered 2015-08-24: 250 [IU]
  Filled 2015-08-24: qty 5

## 2015-08-24 MED ORDER — SODIUM CHLORIDE 0.9 % IV SOLN
300.0000 mg/m2 | Freq: Once | INTRAVENOUS | Status: AC
  Administered 2015-08-24: 620 mg via INTRAVENOUS
  Filled 2015-08-24: qty 31

## 2015-08-24 MED ORDER — SODIUM CHLORIDE 0.9 % IJ SOLN
10.0000 mL | INTRAMUSCULAR | Status: DC | PRN
  Administered 2015-08-24: 10 mL
  Filled 2015-08-24: qty 10

## 2015-08-24 MED ORDER — DARBEPOETIN ALFA 500 MCG/ML IJ SOSY
500.0000 ug | PREFILLED_SYRINGE | Freq: Once | INTRAMUSCULAR | Status: AC
  Administered 2015-08-24: 500 ug via SUBCUTANEOUS
  Filled 2015-08-24: qty 1

## 2015-08-24 MED ORDER — ZOLEDRONIC ACID 4 MG/5ML IV CONC
3.0000 mg | Freq: Once | INTRAVENOUS | Status: AC
  Administered 2015-08-24: 3 mg via INTRAVENOUS
  Filled 2015-08-24: qty 3.75

## 2015-08-24 MED ORDER — SODIUM CHLORIDE 0.9 % IV SOLN
Freq: Once | INTRAVENOUS | Status: AC
  Administered 2015-08-24: 12:00:00 via INTRAVENOUS

## 2015-08-24 NOTE — Progress Notes (Signed)
ID: Timothy [name]   DOB: [birth year]  MR#: [id number]  HRC#:[id number]  PCP:  SU: OTHER MD: Dorna Leitz, Alysia Penna, Georgia Duff, Geryl Councilman  CHIEF COMPLAINT: multiple myeloma  CURRENT THERAPY: cyclophosphamide, darbepoetin   MYELOMA HISTORY: From the earlier summary:  Patient presented in April 2007 with symptomatic slowly progressive anemia, with normal iron parameters, folic acid, and vitamin B12. Plans were made to obtain bone marrow aspirate and biopsy as an outpatient, however the patient was hospitalized shortly thereafter with a creatinine of 8.6. Renal biopsy demonstrated the presence of  light chain deposition disease. Bone marrow biopsy demonstrated 37% plasma cells. A skeletal survey showed small calvarial lytic lesions, mild osteopenia, and cervical spondylosis. With a well-established diagnosis of light-chain multiple myeloma Dr. Melodie Bouillon subsequent treatments are as detailed below.   INTERVAL HISTORY: Timothy [name] returns today for follow up of his multiple myeloma accompanied by his wife Timothy [name]. We have changed his cyclophosphamide to every 3 weeks and dropped the dexamethasone to make sure we don't immunocompromise and to much. He is also receiving Aranesp every 3 weeks now at a slightly higher dose  Incidentally Timothy [name] went off her methotrexate and has regained a little bit of the weight she lost  REVIEW OF SYSTEMS: Dick tolerates the Cytoxan remarkably well and is not aware of any side effects from it at all. He did not notice any change going off the dexamethasone. The pain from the neuropathy is pretty awful particularly in the afternoon and evenings, but he manages to control it with hydromorphone which he takes about once daily at this point. Of course he has significant problems with his lower extremities, especially ankle swelling issues. He is short of breath even at rest. The back pain is worse  when he stands. He does what he can by standing and then sits down until he feels a little better. He says he still mowing the lawn, of course on a riding more. He bruises easily, on Plavix. His heart for him to get around but the walker does help. A detailed review of systems today was otherwise stable  PAST MEDICAL HISTORY: Past Medical History  Diagnosis Date  . ASCVD (arteriosclerotic cardiovascular disease)   . Gout   . Hypertension   . Lumbar disc disease     post laminectomy  . History of echocardiogram 11/02/2010    EF range of 30 to 35% / There is hypokinsesis of the basal-mild inferolateral myocardium  . CHF (congestive heart failure) ([place])   . Heart murmur   . Ischemic cardiomyopathy   . Renal insufficiency   . Peripheral neuropathy ([name])   . Hyperlipidemia   . SOB (shortness of breath)   . Fatigue   . Peripheral neuropathy ([place])   . Coronary artery disease     CABG 2002 by Dr. Roxy Manns with LIMA to LAD, SVG to intermediate, SVG to LCX, & SVG to PL. Last nuclear in 2012 showing large inferior scar with EF of 39%.   . Blood transfusion 10/2011; 07/2013    "multiple myeloma; when I broke my leg"  . History of shingles MARCH 2009    SHINGLE LESIONS WERE AROUND RIGHT EYE--PT HAS RESIDUAL ITCHING AROUND THE EYE.  . Osteomyelitis ([place])     s/p toe amputation  . Chronic systolic heart failure ([facility])     EF of 30% per echo 02/2012  . Protein malnutrition ([place])   . Critical lower limb ischemia   .  Peripheral vascular disease ([place])   . Hx of cardiovascular stress test     Lexiscan Myoview (1/16):  Large inferior and apical scar; no ischemia, EF 27%;  High Risk  . Inferior myocardial infarction ([place]) 1986  . Pulmonary embolism ([place])     "when I had big toe amputated"  . Chronic anemia     "due to the multiple myeloma"  . Chronic lower back pain   . Multiple myeloma ([place]) dx'd 04/2006    with recurrence of increasing problems  DR. [place] -ONCOLOGIST.  PT HAS BEEN OFF CHEMO SINCE  HIS HOSPITALIZATION FEB 2013 FOR LEFT FOOT INFECTION  . Basal cell carcinoma     "chest left arm"   PAST SURGICAL HISTORY: Past Surgical History  Procedure Laterality Date  . Lumbar laminectomy    . Cardiac catheterization  06/20/2000    Severe coronary disease (totally occluded right artery, 90% left circumflex, 50-60% intermediate, and 90-95% ostial left anterior descending)   . Coronary artery bypass graft  2002    x4 / Left internal mammary artery to the LAD.  / Saphenous vein graft to the right posterolateral.  /  Saphenous vein graft to  the ramus intermedius. / Saphenous vein graft to the circumflex marginal     . Amputation  03/06/2012    Procedure: AMPUTATION RAY;  Surgeon: Alta Corning, MD;  Location: [facility];  Service: Orthopedics;  Laterality: Left;  First Ray Amputation  . Intramedullary (im) nail intertrochanteric Right 08/17/2013    Procedure: INTRAMEDULLARY (IM) NAIL INTERTROCHANTRIC;  Surgeon: Marybelle Killings, MD;  Location: [facility];  Service: Orthopedics;  Laterality: Right;  . Endarterectomy femoral Left 03/24/2014    Procedure: Left Common Femoral  Artery Endarterectomy with Vein Patch Angioplasty,  Ultrasound ;  Surgeon: Angelia Mould, MD;  Location: [facility];  Service: Vascular;  Laterality: Left;  . Cataract extraction w/ intraocular lens  implant, bilateral Bilateral   . Lower extremity angiogram N/A 03/23/2014    Procedure: LOWER EXTREMITY ANGIOGRAM;  Surgeon: Lorretta Harp, MD;  Location: [facility] CATH LAB;  Service: Cardiovascular;  Laterality: N/A;  . Peripheral vascular catheterization N/A 05/27/2015    Procedure: Lower Extremity Angiography;  Surgeon: Lorretta Harp, MD;  Location: [facility] CV LAB;  Service: Cardiovascular;  Laterality: N/A;  . Peripheral vascular catheterization  06/03/2015    Procedure: Peripheral Vascular Intervention;  Surgeon: Lorretta Harp, MD;  Location: [facility] CV LAB;  Service: Cardiovascular;;  Lt Common Iliac  . Tonsillectomy  ~ 1936   . Inguinal hernia repair Bilateral   . Fracture surgery    . Back surgery    . Basal cell carcinoma excision      "chest left arm"  . 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 He and his wife Timothy [name] are the only ones at home.    ADVANCED DIRECTIVES: Timothy [name] has wife is his healthcare part of attorney  HEALTH MAINTENANCE: Social History  Substance Use Topics  . Smoking status: Former Smoker -- 1.00 packs/day for 20 years    Types: Cigarettes    Quit date: 11/20/1984  . Smokeless tobacco: Never Used  . Alcohol Use: Yes     Comment: 06/03/2015 "might have a drink a few times/year"     Colonoscopy:  Bone density:  Lipid panel:  Allergies  Allergen Reactions  . Morphine And Related Other (See Comments)    Agitated with morphine drip    Current Outpatient Prescriptions  Medication Sig Dispense Refill  . acyclovir (ZOVIRAX) 400 MG tablet Take 1 tablet (400 mg total) by mouth 2 (two) times daily. 60 tablet 2  . allopurinol (ZYLOPRIM) 300 MG tablet TAKE ONE-HALF (1/2) TABLET DAILY 90 tablet 2  . aspirin 81 MG chewable tablet Chew 1 tablet (81 mg total) by mouth daily.    Marland Kitchen atorvastatin (LIPITOR) 10 MG tablet TAKE 1 TABLET EVERY EVENING 90 tablet 2  . B Complex-Biotin-FA (B COMPLETE PO) Take 1 tablet by mouth daily.     . cadexomer iodine (IODOSORB) 0.9 % gel Apply 1 application topically daily.    . carvedilol (COREG) 6.25 MG tablet Take 1 tablet (6.25 mg total) by mouth 2 (two) times daily with a meal. 180 tablet 3  . cephALEXin (KEFLEX) 500 MG capsule Take 500 mg by mouth 3 (three) times daily.    . clopidogrel (PLAVIX) 75 MG tablet Take 1 tablet (75 mg total) by mouth daily with breakfast. 90 tablet 3  . Coenzyme Q10 (CO Q-10 PO) Take 1 tablet by mouth every morning.     . darbepoetin  (ARANESP) 200 MCG/0.4ML SOLN injection Inject 200 mcg into the skin as directed.    . diphenoxylate-atropine (LOMOTIL) 2.5-0.025 MG per tablet Take 1 tablet by mouth 4 (four) times daily as needed for diarrhea or loose stools. 30 tablet 0  . furosemide (LASIX) 40 MG tablet Take 1 tablet (40 mg total) by mouth daily. 90 tablet 1  . Heparin Lock Flush (HEPARIN FLUSH, PORCINE,) 100 UNIT/ML injection 2.72m ( 250 ) per picc line flush. Use 1 syringe per port. 30 Syringe 3  . HYDROmorphone (DILAUDID) 4 MG tablet Take 1 tablet (4 mg total) by mouth every 4 (four) hours as needed for severe pain. 120 tablet 0  . isosorbide mononitrate (IMDUR) 30 MG 24 hr tablet Take 1 tablet (30 mg total) by mouth daily. 90 tablet 3  . lisinopril (PRINIVIL,ZESTRIL) 20 MG tablet TAKE 1 TABLET DAILY 90 tablet 2  . Multiple Vitamin (MULTIVITAMIN) capsule Take 1 capsule by mouth every morning.     . nitroGLYCERIN (NITROSTAT) 0.4 MG SL tablet Place 1 tablet (0.4 mg total) under the tongue every 5 (five) minutes as needed for chest pain. 25 tablet 3  . ondansetron (ZOFRAN) 8 MG tablet Take 1 tablet (8 mg total) by mouth 2 (two) times daily as needed (Nausea or vomiting). 30 tablet 1  . polyethylene glycol (MIRALAX / GLYCOLAX) packet Take 17 g by mouth daily as needed. 14 each 0  . rOPINIRole (REQUIP) 3 MG tablet Take 1 tablet (3 mg total) by mouth at bedtime. 90 tablet 4  . silver sulfADIAZINE (SSD) 1 % cream Apply topically daily. 50 g 0  . sodium chloride 0.9 % SOLN 250 mL with cyclophosphamide 1 G SOLR 300 mg/m2 Inject 300 mg/m2 into the vein every 14 (fourteen) days.    . Sodium Chloride Flush 0.9 % SOLN injection Inject 10 mLs into the vein as directed. 30 Syringe 3  . tamsulosin (FLOMAX) 0.4 MG CAPS capsule Take 0.4 mg by mouth at bedtime.    .Marland Kitchentobramycin (TOBREX) 0.3 % ophthalmic solution Place 1 drop into both eyes daily as needed.  4  . vitamin C (ASCORBIC ACID) 500 MG tablet Take 250 mg by mouth 2 (two) times daily.      . Zoledronic Acid (  ZOMETA IV) Inject 1 each into the vein once.     No current facility-administered medications for this visit.    Objective: Older white man using a walker to ambulate Filed Vitals:   08/24/15 1100  BP: 133/49  Pulse: 78  Temp: 97.5 F (36.4 C)  Resp: 20        Body mass index is 26.43 kg/(m^2).    ECOG FS: 2 Filed Weights   08/24/15 1100  Weight: 184 lb 3.2 oz (83.553 kg)   Sclerae unicteric, pupils round and equal Oropharynx clear and slightly dry No cervical or supraclavicular adenopathy Lungs no rales or rhonchi Heart regular rate and rhythm Abd soft, nontender, positive bowel sounds MSK no focal spinal tenderness, bilateral lower extremity edema, grade 1; there is a focal area of swelling in the lateral aspect of the right lower extremity anteriorly measuring about 6 x 3", which looks like localized lymphedema; there is no erythema or tenderness Neuro: nonfocal, well oriented, appropriate affect    LAB RESULTS:  Results for WILBURT, MESSINA (MRN [id number]) as of 08/24/2015 11:52  Ref. Range 04/09/2015 10:21 04/23/2015 10:28 05/07/2015 10:30 06/22/2015 11:48 07/06/2015 12:07  Kappa free light chain Latest Ref Range: 0.33-1.94 mg/dL 17.30 (H)  45.30 (H)  70.50 (H)  Lambda Free Lght Chn Latest Ref Range: 0.57-2.63 mg/dL 0.47 (L)  1.37  2.33  Kappa:Lambda Ratio Latest Ref Range: 0.26-1.65  36.81 (H)  33.07 (H)  30.26 (H)    Lab Results  Component Value Date   WBC 6.5 08/24/2015   NEUTROABS 3.5 08/24/2015   HGB 9.4* 08/24/2015   HCT 29.7* 08/24/2015   MCV 90.0 08/24/2015   PLT 222 08/24/2015      Chemistry      Component Value Date/Time   NA 138 08/24/2015 1023   NA 139 06/04/2015 0511   K 4.5 08/24/2015 1023   K 4.4 06/04/2015 0511   CL 110 06/04/2015 0511   CL 98 05/12/2013 1048   CO2 23 08/24/2015 1023   CO2 22 06/04/2015 0511   BUN 16.3 08/24/2015 1023   BUN 31* 06/04/2015 0511   CREATININE 1.1 08/24/2015 1023   CREATININE 1.25*  06/04/2015 0511      Component Value Date/Time   CALCIUM 9.0 08/24/2015 1023   CALCIUM 8.3* 06/04/2015 0511   ALKPHOS 50 08/24/2015 1023   ALKPHOS 42 03/25/2014 2115   AST 28 08/24/2015 1023   AST 25 03/25/2014 2115   ALT 19 08/24/2015 1023   ALT 10 03/25/2014 2115   BILITOT 0.61 08/24/2015 1023   BILITOT 0.7 03/25/2014 2115      STUDIES: No results found.   ASSESSMENT: 81 y.o.  with kappa light chain multiple myeloma   (1) presenting April 2007 with anemia and renal failure; renal Bx showing free kappa light chain deposition; bone marrow biopsy showing 37% plasma cells, with normal cytogenetics and FISH;bone survey showing skull lytic lesions; treated with   (2) thalidomide (200 mg/d) and dexamethasone (40 mg/d x4d Q28d) June through Nov 2007, with good response, but poor tolerance;   (3) thalidomide decreased to 100 mg/d, dexamethasone continued, bortezomib (IV) added, Dec 2007 to March 2008   (4) treatment interrupted by multiple complications (peripheral neuropathy, pulmonary embolism, diverticular abscess, CN V zoster, severe DDD, congestive heart failure, rising PSA); maintenance zolendronic acid through March 2012   (5) progression April 2012, treated initially with dexamethasone alone, poorly tolerated;   (6) bortezomib (sq) resumed July 2012; cyclophosphamide and dexamethasone added Sept 2012;  zolendronic acid changed to Q 29month; all treatments held as of March 2013 due to the development of the Left foot ulcer described above  (7) anemia, on aranesp December 2012 to August 2013; resumed Q14d starting May 2015, with slowly rising reticulocyte count  (8) status post Right trochanteric nail with proximal and distal interlock DePuy 11 mm one-pin lag screw, 44 mm distal interlock 08/09/2013 for a right comminuted intertrochanteric fracture  (9) status post removal of the first 2 toes left foot  (10) Left common femoral artery endarterectomy with vein patch angioplasty  using left greater saphenous vein 03/24/2014  (11) status post fall with injury to the right hip, large hematoma, [facility]  (12) on 10/09/2014 starting cyclophosphamide at 300 mg/m every 14 days and dexamethasone 20 mg daily 2 days each week  (13) SIADH: On fluid restriction, reduced to 1.5L on 12/04/14 because of drop in sodium  (14) chronic systolic congestive heart failure, with EF  35% per echo, with history CAD s/p CABG 2002  (15) CKD stage III  (16) peripheral vascular disease, status post outpatient stage diamondback orbital rotational atherectomy along with PTCA and placement of Viabahn covered stent 06/03/2015 for high grade calcified prox L common illiac artery stenosis noted on cath 05/27/2015  (17) poor access: Status post right-sided PICC placement 07/06/2015  (a[facility] wife flushes PICC every Thurs and SAT  PLAN:   DBarbarann Ehlersc appears to be very stable from a myeloma point of view. We have increased his Aranesp to 500 g every 3 weeks and hopefully that will keep his hemoglobin above 9 and preferably around 10. The cyclophosphamide is now being given every 3 weeks and we have omitted the dexamethasone. The point of all these maneuvers is to hopefully allow him a little bit of more room to recover from his multiple other problems.  From the point of view of pain he is using the dialogue did very judiciously and did not require any refills at this point.  I am not sure what the irregularity he showed me in his right lower extremity represents. It appears to be a fluid collection but not a hematoma, and it is not inflamed him a tender, or bolus. It may be focal lymphedema. I have suggested he keep that leg elevated whenever he is off it.  Otherwise he will receive his Aranesp, zolendronate, and Cytoxan today, the Cytoxan and Aranesp 3 weeks from today, and then the same again 6 weeks from today. One  week before that 6 week visit and treatment we will obtain his repeat lab work. Brehanna Deveny C    08/24/2015

## 2015-08-24 NOTE — Patient Instructions (Signed)
Cyclophosphamide injection What is this medicine? CYCLOPHOSPHAMIDE (sye kloe FOSS fa mide) is a chemotherapy drug. It slows the growth of cancer cells. This medicine is used to treat many types of cancer like lymphoma, myeloma, leukemia, breast cancer, and ovarian cancer, to name a few. This medicine may be used for other purposes; ask your health care provider or pharmacist if you have questions. COMMON BRAND NAME(S): Cytoxan, Neosar What should I tell my health care provider before I take this medicine? They need to know if you have any of these conditions: -blood disorders -history of other chemotherapy -infection -kidney disease -liver disease -recent or ongoing radiation therapy -tumors in the bone marrow -an unusual or allergic reaction to cyclophosphamide, other chemotherapy, other medicines, foods, dyes, or preservatives -pregnant or trying to get pregnant -breast-feeding How should I use this medicine? This drug is usually given as an injection into a vein or muscle or by infusion into a vein. It is administered in a hospital or clinic by a specially trained health care professional. Talk to your pediatrician regarding the use of this medicine in children. Special care may be needed. Overdosage: If you think you have taken too much of this medicine contact a poison control center or emergency room at once. NOTE: This medicine is only for you. Do not share this medicine with others. What if I miss a dose? It is important not to miss your dose. Call your doctor or health care professional if you are unable to keep an appointment. What may interact with this medicine? This medicine may interact with the following medications: -amiodarone -amphotericin B -azathioprine -certain antiviral medicines for HIV or AIDS such as protease inhibitors (e.g., indinavir, ritonavir) and zidovudine -certain blood pressure medications such as benazepril, captopril, enalapril, fosinopril,  lisinopril, moexipril, monopril, perindopril, quinapril, ramipril, trandolapril -certain cancer medications such as anthracyclines (e.g., daunorubicin, doxorubicin), busulfan, cytarabine, paclitaxel, pentostatin, tamoxifen, trastuzumab -certain diuretics such as chlorothiazide, chlorthalidone, hydrochlorothiazide, indapamide, metolazone -certain medicines that treat or prevent blood clots like warfarin -certain muscle relaxants such as succinylcholine -cyclosporine -etanercept -indomethacin -medicines to increase blood counts like filgrastim, pegfilgrastim, sargramostim -medicines used as general anesthesia -metronidazole -natalizumab This list may not describe all possible interactions. Give your health care provider a list of all the medicines, herbs, non-prescription drugs, or dietary supplements you use. Also tell them if you smoke, drink alcohol, or use illegal drugs. Some items may interact with your medicine. What should I watch for while using this medicine? Visit your doctor for checks on your progress. This drug may make you feel generally unwell. This is not uncommon, as chemotherapy can affect healthy cells as well as cancer cells. Report any side effects. Continue your course of treatment even though you feel ill unless your doctor tells you to stop. Drink water or other fluids as directed. Urinate often, even at night. In some cases, you may be given additional medicines to help with side effects. Follow all directions for their use. Call your doctor or health care professional for advice if you get a fever, chills or sore throat, or other symptoms of a cold or flu. Do not treat yourself. This drug decreases your body's ability to fight infections. Try to avoid being around people who are sick. This medicine may increase your risk to bruise or bleed. Call your doctor or health care professional if you notice any unusual bleeding. Be careful brushing and flossing your teeth or using a  toothpick because you may get an infection or bleed   more easily. If you have any dental work done, tell your dentist you are receiving this medicine. You may get drowsy or dizzy. Do not drive, use machinery, or do anything that needs mental alertness until you know how this medicine affects you. Do not become pregnant while taking this medicine or for 1 year after stopping it. Women should inform their doctor if they wish to become pregnant or think they might be pregnant. Men should not father a child while taking this medicine and for 4 months after stopping it. There is a potential for serious side effects to an unborn child. Talk to your health care professional or pharmacist for more information. Do not breast-feed an infant while taking this medicine. This medicine may interfere with the ability to have a child. This medicine has caused ovarian failure in some women. This medicine has caused reduced sperm counts in some men. You should talk with your doctor or health care professional if you are concerned about your fertility. If you are going to have surgery, tell your doctor or health care professional that you have taken this medicine. What side effects may I notice from receiving this medicine? Side effects that you should report to your doctor or health care professional as soon as possible: -allergic reactions like skin rash, itching or hives, swelling of the face, lips, or tongue -low blood counts - this medicine may decrease the number of white blood cells, red blood cells and platelets. You may be at increased risk for infections and bleeding. -signs of infection - fever or chills, cough, sore throat, pain or difficulty passing urine -signs of decreased platelets or bleeding - bruising, pinpoint red spots on the skin, black, tarry stools, blood in the urine -signs of decreased red blood cells - unusually weak or tired, fainting spells, lightheadedness -breathing problems -dark  urine -dizziness -palpitations -swelling of the ankles, feet, hands -trouble passing urine or change in the amount of urine -weight gain -yellowing of the eyes or skin Side effects that usually do not require medical attention (report to your doctor or health care professional if they continue or are bothersome): -changes in nail or skin color -hair loss -missed menstrual periods -mouth sores -nausea, vomiting This list may not describe all possible side effects. Call your doctor for medical advice about side effects. You may report side effects to FDA at 1-800-FDA-1088. Where should I keep my medicine? This drug is given in a hospital or clinic and will not be stored at home. NOTE: This sheet is a summary. It may not cover all possible information. If you have questions about this medicine, talk to your doctor, pharmacist, or health care provider.  2015, Elsevier/Gold Standard. (2012-09-20 16:22:58)  Darbepoetin Alfa injection What is this medicine? DARBEPOETIN ALFA (dar be POE e tin AL fa) helps your body make more red blood cells. It is used to treat anemia caused by chronic kidney failure and chemotherapy. This medicine may be used for other purposes; ask your health care provider or pharmacist if you have questions. COMMON BRAND NAME(S): Aranesp What should I tell my health care provider before I take this medicine? They need to know if you have any of these conditions: -blood clotting disorders or history of blood clots -cancer patient not on chemotherapy -cystic fibrosis -heart disease, such as angina, heart failure, or a history of a heart attack -hemoglobin level of 12 g/dL or greater -high blood pressure -low levels of folate, iron, or vitamin B12 -seizures -an unusual  or allergic reaction to darbepoetin, erythropoietin, albumin, hamster proteins, latex, other medicines, foods, dyes, or preservatives -pregnant or trying to get pregnant -breast-feeding How should I use  this medicine? This medicine is for injection into a vein or under the skin. It is usually given by a health care professional in a hospital or clinic setting. If you get this medicine at home, you will be taught how to prepare and give this medicine. Do not shake the solution before you withdraw a dose. Use exactly as directed. Take your medicine at regular intervals. Do not take your medicine more often than directed. It is important that you put your used needles and syringes in a special sharps container. Do not put them in a trash can. If you do not have a sharps container, call your pharmacist or healthcare provider to get one. Talk to your pediatrician regarding the use of this medicine in children. While this medicine may be used in children as young as 1 year for selected conditions, precautions do apply. Overdosage: If you think you have taken too much of this medicine contact a poison control center or emergency room at once. NOTE: This medicine is only for you. Do not share this medicine with others. What if I miss a dose? If you miss a dose, take it as soon as you can. If it is almost time for your next dose, take only that dose. Do not take double or extra doses. What may interact with this medicine? Do not take this medicine with any of the following medications: -epoetin alfa This list may not describe all possible interactions. Give your health care provider a list of all the medicines, herbs, non-prescription drugs, or dietary supplements you use. Also tell them if you smoke, drink alcohol, or use illegal drugs. Some items may interact with your medicine. What should I watch for while using this medicine? Visit your prescriber or health care professional for regular checks on your progress and for the needed blood tests and blood pressure measurements. It is especially important for the doctor to make sure your hemoglobin level is in the desired range, to limit the risk of potential  side effects and to give you the best benefit. Keep all appointments for any recommended tests. Check your blood pressure as directed. Ask your doctor what your blood pressure should be and when you should contact him or her. As your body makes more red blood cells, you may need to take iron, folic acid, or vitamin B supplements. Ask your doctor or health care provider which products are right for you. If you have kidney disease continue dietary restrictions, even though this medication can make you feel better. Talk with your doctor or health care professional about the foods you eat and the vitamins that you take. What side effects may I notice from receiving this medicine? Side effects that you should report to your doctor or health care professional as soon as possible: -allergic reactions like skin rash, itching or hives, swelling of the face, lips, or tongue -breathing problems -changes in vision -chest pain -confusion, trouble speaking or understanding -feeling faint or lightheaded, falls -high blood pressure -muscle aches or pains -pain, swelling, warmth in the leg -rapid weight gain -severe headaches -sudden numbness or weakness of the face, arm or leg -trouble walking, dizziness, loss of balance or coordination -seizures (convulsions) -swelling of the ankles, feet, hands -unusually weak or tired Side effects that usually do not require medical attention (report to your doctor  or health care professional if they continue or are bothersome): -diarrhea -fever, chills (flu-like symptoms) -headaches -nausea, vomiting -redness, stinging, or swelling at site where injected This list may not describe all possible side effects. Call your doctor for medical advice about side effects. You may report side effects to FDA at 1-800-FDA-1088. Where should I keep my medicine? Keep out of the reach of children. Store in a refrigerator between 2 and 8 degrees C (36 and 46 degrees F). Do not  freeze. Do not shake. Throw away any unused portion if using a single-dose vial. Throw away any unused medicine after the expiration date. NOTE: This sheet is a summary. It may not cover all possible information. If you have questions about this medicine, talk to your doctor, pharmacist, or health care provider.  2015, Elsevier/Gold Standard. (2008-10-20 10:23:57)  Zoledronic Acid injection (Hypercalcemia, Oncology) What is this medicine? ZOLEDRONIC ACID (ZOE le dron ik AS id) lowers the amount of calcium loss from bone. It is used to treat too much calcium in your blood from cancer. It is also used to prevent complications of cancer that has spread to the bone. This medicine may be used for other purposes; ask your health care provider or pharmacist if you have questions. COMMON BRAND NAME(S): Zometa What should I tell my health care provider before I take this medicine? They need to know if you have any of these conditions: -aspirin-sensitive asthma -cancer, especially if you are receiving medicines used to treat cancer -dental disease or wear dentures -infection -kidney disease -receiving corticosteroids like dexamethasone or prednisone -an unusual or allergic reaction to zoledronic acid, other medicines, foods, dyes, or preservatives -pregnant or trying to get pregnant -breast-feeding How should I use this medicine? This medicine is for infusion into a vein. It is given by a health care professional in a hospital or clinic setting. Talk to your pediatrician regarding the use of this medicine in children. Special care may be needed. Overdosage: If you think you have taken too much of this medicine contact a poison control center or emergency room at once. NOTE: This medicine is only for you. Do not share this medicine with others. What if I miss a dose? It is important not to miss your dose. Call your doctor or health care professional if you are unable to keep an appointment. What may  interact with this medicine? -certain antibiotics given by injection -NSAIDs, medicines for pain and inflammation, like ibuprofen or naproxen -some diuretics like bumetanide, furosemide -teriparatide -thalidomide This list may not describe all possible interactions. Give your health care provider a list of all the medicines, herbs, non-prescription drugs, or dietary supplements you use. Also tell them if you smoke, drink alcohol, or use illegal drugs. Some items may interact with your medicine. What should I watch for while using this medicine? Visit your doctor or health care professional for regular checkups. It may be some time before you see the benefit from this medicine. Do not stop taking your medicine unless your doctor tells you to. Your doctor may order blood tests or other tests to see how you are doing. Women should inform their doctor if they wish to become pregnant or think they might be pregnant. There is a potential for serious side effects to an unborn child. Talk to your health care professional or pharmacist for more information. You should make sure that you get enough calcium and vitamin D while you are taking this medicine. Discuss the foods you eat and the  vitamins you take with your health care professional. Some people who take this medicine have severe bone, joint, and/or muscle pain. This medicine may also increase your risk for jaw problems or a broken thigh bone. Tell your doctor right away if you have severe pain in your jaw, bones, joints, or muscles. Tell your doctor if you have any pain that does not go away or that gets worse. Tell your dentist and dental surgeon that you are taking this medicine. You should not have major dental surgery while on this medicine. See your dentist to have a dental exam and fix any dental problems before starting this medicine. Take good care of your teeth while on this medicine. Make sure you see your dentist for regular follow-up  appointments. What side effects may I notice from receiving this medicine? Side effects that you should report to your doctor or health care professional as soon as possible: -allergic reactions like skin rash, itching or hives, swelling of the face, lips, or tongue -anxiety, confusion, or depression -breathing problems -changes in vision -eye pain -feeling faint or lightheaded, falls -jaw pain, especially after dental work -mouth sores -muscle cramps, stiffness, or weakness -trouble passing urine or change in the amount of urine Side effects that usually do not require medical attention (report to your doctor or health care professional if they continue or are bothersome): -bone, joint, or muscle pain -constipation -diarrhea -fever -hair loss -irritation at site where injected -loss of appetite -nausea, vomiting -stomach upset -trouble sleeping -trouble swallowing -weak or tired This list may not describe all possible side effects. Call your doctor for medical advice about side effects. You may report side effects to FDA at 1-800-FDA-1088. Where should I keep my medicine? This drug is given in a hospital or clinic and will not be stored at home. NOTE: This sheet is a summary. It may not cover all possible information. If you have questions about this medicine, talk to your doctor, pharmacist, or health care provider.  2015, Elsevier/Gold Standard. (2013-04-17 13:03:13)  [facility] Discharge Instructions for Patients Receiving Chemotherapy  Today you received the following chemotherapy agents Cytoxan.  To help prevent nausea and vomiting after your treatment, we encourage you to take your nausea medication as directed.   If you develop nausea and vomiting that is not controlled by your nausea medication, call the clinic.   BELOW ARE SYMPTOMS THAT SHOULD BE REPORTED IMMEDIATELY:  *FEVER GREATER THAN 100.5 F  *CHILLS WITH OR WITHOUT FEVER  NAUSEA AND  VOMITING THAT IS NOT CONTROLLED WITH YOUR NAUSEA MEDICATION  *UNUSUAL SHORTNESS OF BREATH  *UNUSUAL BRUISING OR BLEEDING  TENDERNESS IN MOUTH AND THROAT WITH OR WITHOUT PRESENCE OF ULCERS  *URINARY PROBLEMS  *BOWEL PROBLEMS  UNUSUAL RASH Items with * indicate a potential emergency and should be followed up as soon as possible.  Feel free to call the clinic you have any questions or concerns. The clinic phone number is [phone].  Please show the [facility] at check-in to the Emergency Department and triage nurse.

## 2015-08-24 NOTE — Patient Instructions (Signed)
PICC Home Guide A peripherally inserted central catheter (PICC) is a long, thin, flexible tube that is inserted into a vein in the upper arm. It is a form of intravenous (IV) access. It is considered to be a "central" line because the tip of the PICC ends in a large vein in your chest. This large vein is called the superior vena cava (SVC). The PICC tip ends in the SVC because there is a lot of blood flow in the SVC. This allows medicines and IV fluids to be quickly distributed throughout the body. The PICC is inserted using a sterile technique by a specially trained nurse or physician. After the PICC is inserted, a chest X-ray exam is done to be sure it is in the correct place.  A PICC may be placed for different reasons, such as:  To give medicines and liquid nutrition that can only be given through a central line. Examples are:  Certain antibiotic treatments.  Chemotherapy.  Total parenteral nutrition (TPN).  To take frequent blood samples.  To give IV fluids and blood products.  If there is difficulty placing a peripheral intravenous (PIV) catheter. If taken care of properly, a PICC can remain in place for several months. A PICC can also allow a person to go home from the hospital early. Medicine and PICC care can be managed at home by a family member or home health care team. WHAT PROBLEMS CAN HAPPEN WHEN I HAVE A PICC? Problems with a PICC can occasionally occur. These may include the following:  A blood clot (thrombus) forming in or at the tip of the PICC. This can cause the PICC to become clogged. A clot-dissolving medicine called tissue plasminogen activator (tPA) can be given through the PICC to help break up the clot.  Inflammation of the vein (phlebitis) in which the PICC is placed. Signs of inflammation may include redness, pain at the insertion site, red streaks, or being able to feel a "cord" in the vein where the PICC is located.  Infection in the PICC or at the insertion  site. Signs of infection may include fever, chills, redness, swelling, or pus drainage from the PICC insertion site.  PICC movement (malposition). The PICC tip may move from its original position due to excessive physical activity, forceful coughing, sneezing, or vomiting.  A break or cut in the PICC. It is important to not use scissors near the PICC.  Nerve or tendon irritation or injury during PICC insertion. WHAT SHOULD I KEEP IN MIND ABOUT ACTIVITIES WHEN I HAVE A PICC?  You may bend your arm and move it freely. If your PICC is near or at the bend of your elbow, avoid activity with repeated motion at the elbow.  Rest at home for the remainder of the day following PICC line insertion.  Avoid lifting heavy objects as instructed by your health care provider.  Avoid using a crutch with the arm on the same side as your PICC. You may need to use a walker. WHAT SHOULD I KNOW ABOUT MY PICC DRESSING?  Keep your PICC bandage (dressing) clean and dry to prevent infection.  Ask your health care provider when you may shower. Ask your health care provider to teach you how to wrap the PICC when you do take a shower.  Change the PICC dressing as instructed by your health care provider.  Change your PICC dressing if it becomes loose or wet. WHAT SHOULD I KNOW ABOUT PICC CARE?  Check the PICC insertion site   daily for leakage, redness, swelling, or pain.  Do not take a bath, swim, or use hot tubs when you have a PICC. Cover PICC line with clear plastic wrap and tape to keep it dry while showering.  Flush the PICC as directed by your health care provider. Let your health care provider know right away if the PICC is difficult to flush or does not flush. Do not use force to flush the PICC.  Do not use a syringe that is less than 10 mL to flush the PICC.  Never pull or tug on the PICC.  Avoid blood pressure checks on the arm with the PICC.  Keep your PICC identification card with you at all  times.  Do not take the PICC out yourself. Only a trained clinical professional should remove the PICC. SEEK IMMEDIATE MEDICAL CARE IF:  Your PICC is accidentally pulled all the way out. If this happens, cover the insertion site with a bandage or gauze dressing. Do not throw the PICC away. Your health care provider will need to inspect it.  Your PICC was tugged or pulled and has partially come out. Do not  push the PICC back in.  There is any type of drainage, redness, or swelling where the PICC enters the skin.  You cannot flush the PICC, it is difficult to flush, or the PICC leaks around the insertion site when it is flushed.  You hear a "flushing" sound when the PICC is flushed.  You have pain, discomfort, or numbness in your arm, shoulder, or jaw on the same side as the PICC.  You feel your heart "racing" or skipping beats.  You notice a hole or tear in the PICC.  You develop chills or a fever. MAKE SURE YOU:   Understand these instructions.  Will watch your condition.  Will get help right away if you are not doing well or get worse. Document Released: 05/13/2003 Document Revised: 03/23/2014 Document Reviewed: 07/14/2013 ExitCare Patient Information 2015 [employer]. This information is not intended to replace advice given to you by your health care provider. Make sure you discuss any questions you have with your health care provider.  

## 2015-08-26 LAB — KAPPA/LAMBDA LIGHT CHAINS
KAPPA LAMBDA RATIO: 25.77 — AB (ref 0.26–1.65)
Kappa free light chain: 48.7 mg/dL — ABNORMAL HIGH (ref 0.33–1.94)
LAMBDA FREE LGHT CHN: 1.89 mg/dL (ref 0.57–2.63)

## 2015-08-26 LAB — PROTEIN ELECTROPHORESIS, SERUM
ALPHA-2-GLOBULIN: 0.9 g/dL (ref 0.5–0.9)
Albumin ELP: 3.4 g/dL — ABNORMAL LOW (ref 3.8–4.8)
Alpha-1-Globulin: 0.4 g/dL — ABNORMAL HIGH (ref 0.2–0.3)
BETA GLOBULIN: 0.4 g/dL (ref 0.4–0.6)
Beta 2: 0.4 g/dL (ref 0.2–0.5)
GAMMA GLOBULIN: 0.9 g/dL (ref 0.8–1.7)
TOTAL PROTEIN, SERUM ELECTROPHOR: 6.3 g/dL (ref 6.1–8.1)

## 2015-08-29 ENCOUNTER — Other Ambulatory Visit: Payer: Self-pay | Admitting: Oncology

## 2015-08-31 ENCOUNTER — Ambulatory Visit (HOSPITAL_BASED_OUTPATIENT_CLINIC_OR_DEPARTMENT_OTHER): Payer: Medicare Other

## 2015-08-31 ENCOUNTER — Other Ambulatory Visit: Payer: Medicare Other

## 2015-08-31 ENCOUNTER — Ambulatory Visit: Payer: Medicare Other

## 2015-08-31 DIAGNOSIS — C9000 Multiple myeloma not having achieved remission: Secondary | ICD-10-CM | POA: Diagnosis present

## 2015-08-31 DIAGNOSIS — Z452 Encounter for adjustment and management of vascular access device: Secondary | ICD-10-CM

## 2015-08-31 MED ORDER — SODIUM CHLORIDE 0.9 % IJ SOLN
10.0000 mL | INTRAMUSCULAR | Status: DC | PRN
  Administered 2015-08-31: 10 mL via INTRAVENOUS
  Filled 2015-08-31: qty 10

## 2015-08-31 MED ORDER — HEPARIN SOD (PORK) LOCK FLUSH 100 UNIT/ML IV SOLN
500.0000 [IU] | Freq: Once | INTRAVENOUS | Status: AC
  Administered 2015-08-31: 250 [IU] via INTRAVENOUS
  Filled 2015-08-31: qty 5

## 2015-08-31 NOTE — Patient Instructions (Signed)
PICC Home Guide A peripherally inserted central catheter (PICC) is a long, thin, flexible tube that is inserted into a vein in the upper arm. It is a form of intravenous (IV) access. It is considered to be a "central" line because the tip of the PICC ends in a large vein in your chest. This large vein is called the superior vena cava (SVC). The PICC tip ends in the SVC because there is a lot of blood flow in the SVC. This allows medicines and IV fluids to be quickly distributed throughout the body. The PICC is inserted using a sterile technique by a specially trained nurse or physician. After the PICC is inserted, a chest X-ray exam is done to be sure it is in the correct place.  A PICC may be placed for different reasons, such as:  To give medicines and liquid nutrition that can only be given through a central line. Examples are:  Certain antibiotic treatments.  Chemotherapy.  Total parenteral nutrition (TPN).  To take frequent blood samples.  To give IV fluids and blood products.  If there is difficulty placing a peripheral intravenous (PIV) catheter. If taken care of properly, a PICC can remain in place for several months. A PICC can also allow a person to go home from the hospital early. Medicine and PICC care can be managed at home by a family member or home health care team. WHAT PROBLEMS CAN HAPPEN WHEN I HAVE A PICC? Problems with a PICC can occasionally occur. These may include the following:  A blood clot (thrombus) forming in or at the tip of the PICC. This can cause the PICC to become clogged. A clot-dissolving medicine called tissue plasminogen activator (tPA) can be given through the PICC to help break up the clot.  Inflammation of the vein (phlebitis) in which the PICC is placed. Signs of inflammation may include redness, pain at the insertion site, red streaks, or being able to feel a "cord" in the vein where the PICC is located.  Infection in the PICC or at the insertion  site. Signs of infection may include fever, chills, redness, swelling, or pus drainage from the PICC insertion site.  PICC movement (malposition). The PICC tip may move from its original position due to excessive physical activity, forceful coughing, sneezing, or vomiting.  A break or cut in the PICC. It is important to not use scissors near the PICC.  Nerve or tendon irritation or injury during PICC insertion. WHAT SHOULD I KEEP IN MIND ABOUT ACTIVITIES WHEN I HAVE A PICC?  You may bend your arm and move it freely. If your PICC is near or at the bend of your elbow, avoid activity with repeated motion at the elbow.  Rest at home for the remainder of the day following PICC line insertion.  Avoid lifting heavy objects as instructed by your health care provider.  Avoid using a crutch with the arm on the same side as your PICC. You may need to use a walker. WHAT SHOULD I KNOW ABOUT MY PICC DRESSING?  Keep your PICC bandage (dressing) clean and dry to prevent infection.  Ask your health care provider when you may shower. Ask your health care provider to teach you how to wrap the PICC when you do take a shower.  Change the PICC dressing as instructed by your health care provider.  Change your PICC dressing if it becomes loose or wet. WHAT SHOULD I KNOW ABOUT PICC CARE?  Check the PICC insertion site   daily for leakage, redness, swelling, or pain.  Do not take a bath, swim, or use hot tubs when you have a PICC. Cover PICC line with clear plastic wrap and tape to keep it dry while showering.  Flush the PICC as directed by your health care provider. Let your health care provider know right away if the PICC is difficult to flush or does not flush. Do not use force to flush the PICC.  Do not use a syringe that is less than 10 mL to flush the PICC.  Never pull or tug on the PICC.  Avoid blood pressure checks on the arm with the PICC.  Keep your PICC identification card with you at all  times.  Do not take the PICC out yourself. Only a trained clinical professional should remove the PICC. SEEK IMMEDIATE MEDICAL CARE IF:  Your PICC is accidentally pulled all the way out. If this happens, cover the insertion site with a bandage or gauze dressing. Do not throw the PICC away. Your health care provider will need to inspect it.  Your PICC was tugged or pulled and has partially come out. Do not  push the PICC back in.  There is any type of drainage, redness, or swelling where the PICC enters the skin.  You cannot flush the PICC, it is difficult to flush, or the PICC leaks around the insertion site when it is flushed.  You hear a "flushing" sound when the PICC is flushed.  You have pain, discomfort, or numbness in your arm, shoulder, or jaw on the same side as the PICC.  You feel your heart "racing" or skipping beats.  You notice a hole or tear in the PICC.  You develop chills or a fever. MAKE SURE YOU:   Understand these instructions.  Will watch your condition.  Will get help right away if you are not doing well or get worse.   This information is not intended to replace advice given to you by your health care provider. Make sure you discuss any questions you have with your health care provider.   Document Released: 05/13/2003 Document Revised: 11/27/2014 Document Reviewed: 07/14/2013 Elsevier Interactive Patient Education 2016 [employer]  

## 2015-09-01 ENCOUNTER — Other Ambulatory Visit: Payer: Self-pay | Admitting: Nurse Practitioner

## 2015-09-01 ENCOUNTER — Other Ambulatory Visit: Payer: Self-pay | Admitting: Oncology

## 2015-09-06 ENCOUNTER — Telehealth: Payer: Self-pay | Admitting: *Deleted

## 2015-09-06 ENCOUNTER — Encounter: Payer: Self-pay | Admitting: Podiatry

## 2015-09-06 ENCOUNTER — Ambulatory Visit (INDEPENDENT_AMBULATORY_CARE_PROVIDER_SITE_OTHER): Payer: Medicare Other | Admitting: Podiatry

## 2015-09-06 VITALS — BP 84/43

## 2015-09-06 DIAGNOSIS — Z89422 Acquired absence of other left toe(s): Secondary | ICD-10-CM

## 2015-09-06 DIAGNOSIS — Z9889 Other specified postprocedural states: Secondary | ICD-10-CM

## 2015-09-06 DIAGNOSIS — R609 Edema, unspecified: Secondary | ICD-10-CM

## 2015-09-06 DIAGNOSIS — S98132A Complete traumatic amputation of one left lesser toe, initial encounter: Secondary | ICD-10-CM

## 2015-09-06 NOTE — Telephone Encounter (Signed)
DR. Jacqualyn Posey ordered B/L LE venous dopplers for swelling. Faxed to [facility].

## 2015-09-06 NOTE — Patient Instructions (Signed)
We will order a venous ultrasound of your left leg due to the swelling.  Follow-up with me in 9 weeks for routine care. If there are any problems or any changes in the meantime, please call me.

## 2015-09-06 NOTE — Progress Notes (Signed)
Patient ID: Timothy [name], male   DOB: 1928/03/24, 82 y.o.   MRN: [id number]  DOS: 08/04/15 s/p left partial 3rd toe amputation  Subjective: 81 year old male presents the office and his wife status post left partial third toe amputation.  He states that overall he is doing well. His wife did remove the rest of his sutures at home. He states the incision has closing said no problems. Denies any drainage and denies any swelling redness or red streaks. Denies any swelling to the foot. He does get swelling to his left leg which appears to be a localized area. Been ongoing the last couple weeks. He states he did  Discuss this with his primary care physician and referred him to [place] for this as well. Denies any redness or increase in warmth over. He has no recent injury or trauma to the leg. No other complaints at this time. He denies any systemic complaints such as fevers, chills, nausea, vomiting.  Objective: AAO 3, NAD Neurovascular status unchanged Incision at  the distal aspect of the left third toe is well coapted without any evidence of dehiscence and eschar has formed. There is no overlying edema, erythema, increase in warmth. There is no ascending cellulitis. No areas of fluctuation or crepitation. No other open lesions  Are identified bilaterally. There is a small hyperkeratotic lesion left foot submetatarsal area. Upon debridement no underlying ulceration, drainage or other signs of infection. There does appear to be some swelling to the left calf which subjectively the patient states his ongoing for last couple weeks. There is some pain with compression however again he states his his been ongoing and not a recent finding. There is no overlying erythema or increase in warmth. There  Is a scab on the anterior aspect of the distal leg from and wound on his healed mostly. There is no surrounding erythema, drainage or other signs of infection. No other areas of tenderness to bilateral lower  extremities.  Assessment: 81 year old male status post left partial third toe amputation which appears healed, which left leg swelling  Plan: -Treatment options discussed including all alternatives, risks, and complications -  Amputation site appears to be well healed at this time.He is doing well and are no signs of infection. He can return to his shoe at this time full-time which she already has. - we will order venous duplex of left leg. -Monitor for any clinical signs or symptoms of infection and directed to call the office immediately should any occur or go to the ER. -Follow-up in  4 weeks if the swelling leg continues otherwise I'll see him back in 9 week. or sooner if any problems arise. In the meantime, encouraged to call the office with any questions, concerns, change in symptoms.   Celesta Gentile, DPM

## 2015-09-07 ENCOUNTER — Ambulatory Visit (HOSPITAL_BASED_OUTPATIENT_CLINIC_OR_DEPARTMENT_OTHER): Payer: Medicare Other

## 2015-09-07 DIAGNOSIS — C9000 Multiple myeloma not having achieved remission: Secondary | ICD-10-CM

## 2015-09-07 DIAGNOSIS — Z452 Encounter for adjustment and management of vascular access device: Secondary | ICD-10-CM

## 2015-09-07 MED ORDER — HEPARIN SOD (PORK) LOCK FLUSH 100 UNIT/ML IV SOLN
500.0000 [IU] | Freq: Once | INTRAVENOUS | Status: AC
  Administered 2015-09-07: 250 [IU] via INTRAVENOUS
  Filled 2015-09-07: qty 5

## 2015-09-07 MED ORDER — SODIUM CHLORIDE 0.9 % IJ SOLN
10.0000 mL | INTRAMUSCULAR | Status: DC | PRN
  Administered 2015-09-07: 10 mL via INTRAVENOUS
  Filled 2015-09-07: qty 10

## 2015-09-07 NOTE — Patient Instructions (Signed)
PICC Home Guide A peripherally inserted central catheter (PICC) is a long, thin, flexible tube that is inserted into a vein in the upper arm. It is a form of intravenous (IV) access. It is considered to be a "central" line because the tip of the PICC ends in a large vein in your chest. This large vein is called the superior vena cava (SVC). The PICC tip ends in the SVC because there is a lot of blood flow in the SVC. This allows medicines and IV fluids to be quickly distributed throughout the body. The PICC is inserted using a sterile technique by a specially trained nurse or physician. After the PICC is inserted, a chest X-ray exam is done to be sure it is in the correct place.  A PICC may be placed for different reasons, such as:  To give medicines and liquid nutrition that can only be given through a central line. Examples are:  Certain antibiotic treatments.  Chemotherapy.  Total parenteral nutrition (TPN).  To take frequent blood samples.  To give IV fluids and blood products.  If there is difficulty placing a peripheral intravenous (PIV) catheter. If taken care of properly, a PICC can remain in place for several months. A PICC can also allow a person to go home from the hospital early. Medicine and PICC care can be managed at home by a family member or home health care team. WHAT PROBLEMS CAN HAPPEN WHEN I HAVE A PICC? Problems with a PICC can occasionally occur. These may include the following:  A blood clot (thrombus) forming in or at the tip of the PICC. This can cause the PICC to become clogged. A clot-dissolving medicine called tissue plasminogen activator (tPA) can be given through the PICC to help break up the clot.  Inflammation of the vein (phlebitis) in which the PICC is placed. Signs of inflammation may include redness, pain at the insertion site, red streaks, or being able to feel a "cord" in the vein where the PICC is located.  Infection in the PICC or at the insertion  site. Signs of infection may include fever, chills, redness, swelling, or pus drainage from the PICC insertion site.  PICC movement (malposition). The PICC tip may move from its original position due to excessive physical activity, forceful coughing, sneezing, or vomiting.  A break or cut in the PICC. It is important to not use scissors near the PICC.  Nerve or tendon irritation or injury during PICC insertion. WHAT SHOULD I KEEP IN MIND ABOUT ACTIVITIES WHEN I HAVE A PICC?  You may bend your arm and move it freely. If your PICC is near or at the bend of your elbow, avoid activity with repeated motion at the elbow.  Rest at home for the remainder of the day following PICC line insertion.  Avoid lifting heavy objects as instructed by your health care provider.  Avoid using a crutch with the arm on the same side as your PICC. You may need to use a walker. WHAT SHOULD I KNOW ABOUT MY PICC DRESSING?  Keep your PICC bandage (dressing) clean and dry to prevent infection.  Ask your health care provider when you may shower. Ask your health care provider to teach you how to wrap the PICC when you do take a shower.  Change the PICC dressing as instructed by your health care provider.  Change your PICC dressing if it becomes loose or wet. WHAT SHOULD I KNOW ABOUT PICC CARE?  Check the PICC insertion site   daily for leakage, redness, swelling, or pain.  Do not take a bath, swim, or use hot tubs when you have a PICC. Cover PICC line with clear plastic wrap and tape to keep it dry while showering.  Flush the PICC as directed by your health care provider. Let your health care provider know right away if the PICC is difficult to flush or does not flush. Do not use force to flush the PICC.  Do not use a syringe that is less than 10 mL to flush the PICC.  Never pull or tug on the PICC.  Avoid blood pressure checks on the arm with the PICC.  Keep your PICC identification card with you at all  times.  Do not take the PICC out yourself. Only a trained clinical professional should remove the PICC. SEEK IMMEDIATE MEDICAL CARE IF:  Your PICC is accidentally pulled all the way out. If this happens, cover the insertion site with a bandage or gauze dressing. Do not throw the PICC away. Your health care provider will need to inspect it.  Your PICC was tugged or pulled and has partially come out. Do not  push the PICC back in.  There is any type of drainage, redness, or swelling where the PICC enters the skin.  You cannot flush the PICC, it is difficult to flush, or the PICC leaks around the insertion site when it is flushed.  You hear a "flushing" sound when the PICC is flushed.  You have pain, discomfort, or numbness in your arm, shoulder, or jaw on the same side as the PICC.  You feel your heart "racing" or skipping beats.  You notice a hole or tear in the PICC.  You develop chills or a fever. MAKE SURE YOU:   Understand these instructions.  Will watch your condition.  Will get help right away if you are not doing well or get worse.   This information is not intended to replace advice given to you by your health care provider. Make sure you discuss any questions you have with your health care provider.   Document Released: 05/13/2003 Document Revised: 11/27/2014 Document Reviewed: 07/14/2013 Elsevier Interactive Patient Education 2016 [employer]  

## 2015-09-09 ENCOUNTER — Ambulatory Visit (HOSPITAL_COMMUNITY)
Admission: RE | Admit: 2015-09-09 | Discharge: 2015-09-09 | Disposition: A | Discharge status: Home / Self Care | Payer: Medicare Other | Source: Ambulatory Visit | Attending: Podiatry | Admitting: Podiatry

## 2015-09-09 DIAGNOSIS — I10 Essential (primary) hypertension: Secondary | ICD-10-CM | POA: Insufficient documentation

## 2015-09-09 DIAGNOSIS — Z951 Presence of aortocoronary bypass graft: Secondary | ICD-10-CM | POA: Insufficient documentation

## 2015-09-09 DIAGNOSIS — Z87891 Personal history of nicotine dependence: Secondary | ICD-10-CM | POA: Insufficient documentation

## 2015-09-09 DIAGNOSIS — I2510 Atherosclerotic heart disease of native coronary artery without angina pectoris: Secondary | ICD-10-CM | POA: Diagnosis not present

## 2015-09-09 DIAGNOSIS — R609 Edema, unspecified: Secondary | ICD-10-CM | POA: Diagnosis present

## 2015-09-09 DIAGNOSIS — Z89422 Acquired absence of other left toe(s): Secondary | ICD-10-CM | POA: Insufficient documentation

## 2015-09-09 DIAGNOSIS — Z7902 Long term (current) use of antithrombotics/antiplatelets: Secondary | ICD-10-CM | POA: Diagnosis not present

## 2015-09-14 ENCOUNTER — Other Ambulatory Visit (HOSPITAL_BASED_OUTPATIENT_CLINIC_OR_DEPARTMENT_OTHER): Payer: Medicare Other

## 2015-09-14 ENCOUNTER — Ambulatory Visit (HOSPITAL_BASED_OUTPATIENT_CLINIC_OR_DEPARTMENT_OTHER): Payer: Medicare Other

## 2015-09-14 VITALS — BP 131/46 | HR 76 | Temp 98.1°F | Resp 18 | Ht 70.0 in

## 2015-09-14 DIAGNOSIS — C9000 Multiple myeloma not having achieved remission: Secondary | ICD-10-CM | POA: Diagnosis present

## 2015-09-14 DIAGNOSIS — Z5111 Encounter for antineoplastic chemotherapy: Secondary | ICD-10-CM

## 2015-09-14 DIAGNOSIS — D630 Anemia in neoplastic disease: Secondary | ICD-10-CM | POA: Diagnosis not present

## 2015-09-14 DIAGNOSIS — Z452 Encounter for adjustment and management of vascular access device: Secondary | ICD-10-CM

## 2015-09-14 LAB — CBC WITH DIFFERENTIAL/PLATELET
BASO%: 1.5 % (ref 0.0–2.0)
Basophils Absolute: 0.1 10*3/uL (ref 0.0–0.1)
EOS%: 4.9 % (ref 0.0–7.0)
Eosinophils Absolute: 0.3 10*3/uL (ref 0.0–0.5)
HEMATOCRIT: 28.8 % — AB (ref 38.4–49.9)
HGB: 9.4 g/dL — ABNORMAL LOW (ref 13.0–17.1)
LYMPH#: 1.6 10*3/uL (ref 0.9–3.3)
LYMPH%: 27.4 % (ref 14.0–49.0)
MCH: 28.5 pg (ref 27.2–33.4)
MCHC: 32.6 g/dL (ref 32.0–36.0)
MCV: 87.4 fL (ref 79.3–98.0)
MONO#: 0.7 10*3/uL (ref 0.1–0.9)
MONO%: 12.3 % (ref 0.0–14.0)
NEUT#: 3.1 10*3/uL (ref 1.5–6.5)
NEUT%: 53.9 % (ref 39.0–75.0)
Platelets: 228 10*3/uL (ref 140–400)
RBC: 3.29 10*6/uL — ABNORMAL LOW (ref 4.20–5.82)
RDW: 19.5 % — AB (ref 11.0–14.6)
WBC: 5.7 10*3/uL (ref 4.0–10.3)

## 2015-09-14 LAB — TECHNOLOGIST REVIEW

## 2015-09-14 LAB — COMPREHENSIVE METABOLIC PANEL (CC13)
ALT: 16 U/L (ref 0–55)
AST: 23 U/L (ref 5–34)
Albumin: 3.5 g/dL (ref 3.5–5.0)
Alkaline Phosphatase: 50 U/L (ref 40–150)
Anion Gap: 7 mEq/L (ref 3–11)
BUN: 25.9 mg/dL (ref 7.0–26.0)
CALCIUM: 9.4 mg/dL (ref 8.4–10.4)
CHLORIDE: 108 meq/L (ref 98–109)
CO2: 21 mEq/L — ABNORMAL LOW (ref 22–29)
Creatinine: 1.6 mg/dL — ABNORMAL HIGH (ref 0.7–1.3)
EGFR: 40 mL/min/{1.73_m2} — AB (ref >90)
Glucose: 113 mg/dl (ref 70–140)
POTASSIUM: 4.7 meq/L (ref 3.5–5.1)
SODIUM: 136 meq/L (ref 136–145)
Total Bilirubin: 0.49 mg/dL (ref 0.20–1.20)
Total Protein: 6.5 g/dL (ref 6.4–8.3)

## 2015-09-14 MED ORDER — SODIUM CHLORIDE 0.9 % IV SOLN
Freq: Once | INTRAVENOUS | Status: AC
  Administered 2015-09-14: 11:00:00 via INTRAVENOUS
  Filled 2015-09-14: qty 4

## 2015-09-14 MED ORDER — SODIUM CHLORIDE 0.9 % IV SOLN
Freq: Once | INTRAVENOUS | Status: AC
  Administered 2015-09-14: 11:00:00 via INTRAVENOUS

## 2015-09-14 MED ORDER — SODIUM CHLORIDE 0.9 % IJ SOLN
10.0000 mL | INTRAMUSCULAR | Status: DC | PRN
  Filled 2015-09-14: qty 10

## 2015-09-14 MED ORDER — SODIUM CHLORIDE 0.9 % IV SOLN
300.0000 mg/m2 | Freq: Once | INTRAVENOUS | Status: AC
  Administered 2015-09-14: 620 mg via INTRAVENOUS
  Filled 2015-09-14: qty 31

## 2015-09-14 MED ORDER — HEPARIN SOD (PORK) LOCK FLUSH 100 UNIT/ML IV SOLN
250.0000 [IU] | Freq: Once | INTRAVENOUS | Status: DC | PRN
  Filled 2015-09-14: qty 5

## 2015-09-14 MED ORDER — DARBEPOETIN ALFA 500 MCG/ML IJ SOSY
500.0000 ug | PREFILLED_SYRINGE | Freq: Once | INTRAMUSCULAR | Status: AC
Start: 2015-09-14 — End: 2015-09-14
  Administered 2015-09-14: 500 ug via SUBCUTANEOUS
  Filled 2015-09-14: qty 1

## 2015-09-14 MED ORDER — SODIUM CHLORIDE 0.9 % IJ SOLN
10.0000 mL | INTRAMUSCULAR | Status: DC | PRN
  Administered 2015-09-14: 10 mL via INTRAVENOUS
  Filled 2015-09-14: qty 10

## 2015-09-14 NOTE — Patient Instructions (Signed)
[facility] Discharge Instructions for Patients Receiving Chemotherapy  Today you received the following chemotherapy agents Cytoxan  To help prevent nausea and vomiting after your treatment, we encourage you to take your nausea medication as needed   If you develop nausea and vomiting that is not controlled by your nausea medication, call the clinic.   BELOW ARE SYMPTOMS THAT SHOULD BE REPORTED IMMEDIATELY:  *FEVER GREATER THAN 100.5 F  *CHILLS WITH OR WITHOUT FEVER  NAUSEA AND VOMITING THAT IS NOT CONTROLLED WITH YOUR NAUSEA MEDICATION  *UNUSUAL SHORTNESS OF BREATH  *UNUSUAL BRUISING OR BLEEDING  TENDERNESS IN MOUTH AND THROAT WITH OR WITHOUT PRESENCE OF ULCERS  *URINARY PROBLEMS  *BOWEL PROBLEMS  UNUSUAL RASH Items with * indicate a potential emergency and should be followed up as soon as possible.  Feel free to call the clinic you have any questions or concerns. The clinic phone number is [phone].  Please show the [facility] at check-in to the Emergency Department and triage nurse.

## 2015-09-14 NOTE — Progress Notes (Signed)
OK to treat with creatinine 1.6 per Dr [name]

## 2015-09-14 NOTE — Patient Instructions (Signed)
PICC Home Guide A peripherally inserted central catheter (PICC) is a long, thin, flexible tube that is inserted into a vein in the upper arm. It is a form of intravenous (IV) access. It is considered to be a "central" line because the tip of the PICC ends in a large vein in your chest. This large vein is called the superior vena cava (SVC). The PICC tip ends in the SVC because there is a lot of blood flow in the SVC. This allows medicines and IV fluids to be quickly distributed throughout the body. The PICC is inserted using a sterile technique by a specially trained nurse or physician. After the PICC is inserted, a chest X-ray exam is done to be sure it is in the correct place.  A PICC may be placed for different reasons, such as:  To give medicines and liquid nutrition that can only be given through a central line. Examples are:  Certain antibiotic treatments.  Chemotherapy.  Total parenteral nutrition (TPN).  To take frequent blood samples.  To give IV fluids and blood products.  If there is difficulty placing a peripheral intravenous (PIV) catheter. If taken care of properly, a PICC can remain in place for several months. A PICC can also allow a person to go home from the hospital early. Medicine and PICC care can be managed at home by a family member or home health care team. WHAT PROBLEMS CAN HAPPEN WHEN I HAVE A PICC? Problems with a PICC can occasionally occur. These may include the following:  A blood clot (thrombus) forming in or at the tip of the PICC. This can cause the PICC to become clogged. A clot-dissolving medicine called tissue plasminogen activator (tPA) can be given through the PICC to help break up the clot.  Inflammation of the vein (phlebitis) in which the PICC is placed. Signs of inflammation may include redness, pain at the insertion site, red streaks, or being able to feel a "cord" in the vein where the PICC is located.  Infection in the PICC or at the insertion  site. Signs of infection may include fever, chills, redness, swelling, or pus drainage from the PICC insertion site.  PICC movement (malposition). The PICC tip may move from its original position due to excessive physical activity, forceful coughing, sneezing, or vomiting.  A break or cut in the PICC. It is important to not use scissors near the PICC.  Nerve or tendon irritation or injury during PICC insertion. WHAT SHOULD I KEEP IN MIND ABOUT ACTIVITIES WHEN I HAVE A PICC?  You may bend your arm and move it freely. If your PICC is near or at the bend of your elbow, avoid activity with repeated motion at the elbow.  Rest at home for the remainder of the day following PICC line insertion.  Avoid lifting heavy objects as instructed by your health care provider.  Avoid using a crutch with the arm on the same side as your PICC. You may need to use a walker. WHAT SHOULD I KNOW ABOUT MY PICC DRESSING?  Keep your PICC bandage (dressing) clean and dry to prevent infection.  Ask your health care provider when you may shower. Ask your health care provider to teach you how to wrap the PICC when you do take a shower.  Change the PICC dressing as instructed by your health care provider.  Change your PICC dressing if it becomes loose or wet. WHAT SHOULD I KNOW ABOUT PICC CARE?  Check the PICC insertion site   daily for leakage, redness, swelling, or pain.  Do not take a bath, swim, or use hot tubs when you have a PICC. Cover PICC line with clear plastic wrap and tape to keep it dry while showering.  Flush the PICC as directed by your health care provider. Let your health care provider know right away if the PICC is difficult to flush or does not flush. Do not use force to flush the PICC.  Do not use a syringe that is less than 10 mL to flush the PICC.  Never pull or tug on the PICC.  Avoid blood pressure checks on the arm with the PICC.  Keep your PICC identification card with you at all  times.  Do not take the PICC out yourself. Only a trained clinical professional should remove the PICC. SEEK IMMEDIATE MEDICAL CARE IF:  Your PICC is accidentally pulled all the way out. If this happens, cover the insertion site with a bandage or gauze dressing. Do not throw the PICC away. Your health care provider will need to inspect it.  Your PICC was tugged or pulled and has partially come out. Do not  push the PICC back in.  There is any type of drainage, redness, or swelling where the PICC enters the skin.  You cannot flush the PICC, it is difficult to flush, or the PICC leaks around the insertion site when it is flushed.  You hear a "flushing" sound when the PICC is flushed.  You have pain, discomfort, or numbness in your arm, shoulder, or jaw on the same side as the PICC.  You feel your heart "racing" or skipping beats.  You notice a hole or tear in the PICC.  You develop chills or a fever. MAKE SURE YOU:   Understand these instructions.  Will watch your condition.  Will get help right away if you are not doing well or get worse.   This information is not intended to replace advice given to you by your health care provider. Make sure you discuss any questions you have with your health care provider.   Document Released: 05/13/2003 Document Revised: 11/27/2014 Document Reviewed: 07/14/2013 Elsevier Interactive Patient Education 2016 [employer]  

## 2015-09-16 ENCOUNTER — Encounter: Payer: Self-pay | Admitting: Podiatry

## 2015-09-16 LAB — PROTEIN ELECTROPHORESIS, SERUM
ALBUMIN ELP: 3.5 g/dL — AB (ref 3.8–4.8)
Alpha-1-Globulin: 0.4 g/dL — ABNORMAL HIGH (ref 0.2–0.3)
Alpha-2-Globulin: 0.8 g/dL (ref 0.5–0.9)
BETA 2: 0.3 g/dL (ref 0.2–0.5)
Beta Globulin: 0.4 g/dL (ref 0.4–0.6)
Gamma Globulin: 1 g/dL (ref 0.8–1.7)
TOTAL PROTEIN, SERUM ELECTROPHOR: 6.4 g/dL (ref 6.1–8.1)

## 2015-09-17 ENCOUNTER — Telehealth: Payer: Self-pay | Admitting: *Deleted

## 2015-09-17 NOTE — Telephone Encounter (Addendum)
-----   Message from Trula Slade, DPM sent at 09/16/2015  8:53 PM EDT ----- Can you please let him know that his venous studies were normal. There was no clot or incompetence of the veins. Dr. Leigh Aurora review of results called to pt.

## 2015-09-21 ENCOUNTER — Ambulatory Visit (HOSPITAL_BASED_OUTPATIENT_CLINIC_OR_DEPARTMENT_OTHER): Payer: Medicare Other

## 2015-09-21 DIAGNOSIS — Z452 Encounter for adjustment and management of vascular access device: Secondary | ICD-10-CM

## 2015-09-21 DIAGNOSIS — C9000 Multiple myeloma not having achieved remission: Secondary | ICD-10-CM

## 2015-09-21 MED ORDER — HEPARIN SOD (PORK) LOCK FLUSH 100 UNIT/ML IV SOLN
500.0000 [IU] | Freq: Once | INTRAVENOUS | Status: AC
  Administered 2015-09-21: 250 [IU] via INTRAVENOUS
  Filled 2015-09-21: qty 5

## 2015-09-21 MED ORDER — SODIUM CHLORIDE 0.9 % IJ SOLN
10.0000 mL | INTRAMUSCULAR | Status: DC | PRN
  Administered 2015-09-21: 10 mL via INTRAVENOUS
  Filled 2015-09-21: qty 10

## 2015-09-21 NOTE — Patient Instructions (Signed)
PICC Home Guide A peripherally inserted central catheter (PICC) is a long, thin, flexible tube that is inserted into a vein in the upper arm. It is a form of intravenous (IV) access. It is considered to be a "central" line because the tip of the PICC ends in a large vein in your chest. This large vein is called the superior vena cava (SVC). The PICC tip ends in the SVC because there is a lot of blood flow in the SVC. This allows medicines and IV fluids to be quickly distributed throughout the body. The PICC is inserted using a sterile technique by a specially trained nurse or physician. After the PICC is inserted, a chest X-ray exam is done to be sure it is in the correct place.  A PICC may be placed for different reasons, such as:  To give medicines and liquid nutrition that can only be given through a central line. Examples are:  Certain antibiotic treatments.  Chemotherapy.  Total parenteral nutrition (TPN).  To take frequent blood samples.  To give IV fluids and blood products.  If there is difficulty placing a peripheral intravenous (PIV) catheter. If taken care of properly, a PICC can remain in place for several months. A PICC can also allow a person to go home from the hospital early. Medicine and PICC care can be managed at home by a family member or home health care team. WHAT PROBLEMS CAN HAPPEN WHEN I HAVE A PICC? Problems with a PICC can occasionally occur. These may include the following:  A blood clot (thrombus) forming in or at the tip of the PICC. This can cause the PICC to become clogged. A clot-dissolving medicine called tissue plasminogen activator (tPA) can be given through the PICC to help break up the clot.  Inflammation of the vein (phlebitis) in which the PICC is placed. Signs of inflammation may include redness, pain at the insertion site, red streaks, or being able to feel a "cord" in the vein where the PICC is located.  Infection in the PICC or at the insertion  site. Signs of infection may include fever, chills, redness, swelling, or pus drainage from the PICC insertion site.  PICC movement (malposition). The PICC tip may move from its original position due to excessive physical activity, forceful coughing, sneezing, or vomiting.  A break or cut in the PICC. It is important to not use scissors near the PICC.  Nerve or tendon irritation or injury during PICC insertion. WHAT SHOULD I KEEP IN MIND ABOUT ACTIVITIES WHEN I HAVE A PICC?  You may bend your arm and move it freely. If your PICC is near or at the bend of your elbow, avoid activity with repeated motion at the elbow.  Rest at home for the remainder of the day following PICC line insertion.  Avoid lifting heavy objects as instructed by your health care provider.  Avoid using a crutch with the arm on the same side as your PICC. You may need to use a walker. WHAT SHOULD I KNOW ABOUT MY PICC DRESSING?  Keep your PICC bandage (dressing) clean and dry to prevent infection.  Ask your health care provider when you may shower. Ask your health care provider to teach you how to wrap the PICC when you do take a shower.  Change the PICC dressing as instructed by your health care provider.  Change your PICC dressing if it becomes loose or wet. WHAT SHOULD I KNOW ABOUT PICC CARE?  Check the PICC insertion site   daily for leakage, redness, swelling, or pain.  Do not take a bath, swim, or use hot tubs when you have a PICC. Cover PICC line with clear plastic wrap and tape to keep it dry while showering.  Flush the PICC as directed by your health care provider. Let your health care provider know right away if the PICC is difficult to flush or does not flush. Do not use force to flush the PICC.  Do not use a syringe that is less than 10 mL to flush the PICC.  Never pull or tug on the PICC.  Avoid blood pressure checks on the arm with the PICC.  Keep your PICC identification card with you at all  times.  Do not take the PICC out yourself. Only a trained clinical professional should remove the PICC. SEEK IMMEDIATE MEDICAL CARE IF:  Your PICC is accidentally pulled all the way out. If this happens, cover the insertion site with a bandage or gauze dressing. Do not throw the PICC away. Your health care provider will need to inspect it.  Your PICC was tugged or pulled and has partially come out. Do not  push the PICC back in.  There is any type of drainage, redness, or swelling where the PICC enters the skin.  You cannot flush the PICC, it is difficult to flush, or the PICC leaks around the insertion site when it is flushed.  You hear a "flushing" sound when the PICC is flushed.  You have pain, discomfort, or numbness in your arm, shoulder, or jaw on the same side as the PICC.  You feel your heart "racing" or skipping beats.  You notice a hole or tear in the PICC.  You develop chills or a fever. MAKE SURE YOU:   Understand these instructions.  Will watch your condition.  Will get help right away if you are not doing well or get worse.   This information is not intended to replace advice given to you by your health care provider. Make sure you discuss any questions you have with your health care provider.   Document Released: 05/13/2003 Document Revised: 11/27/2014 Document Reviewed: 07/14/2013 Elsevier Interactive Patient Education 2016 [employer]  

## 2015-09-24 ENCOUNTER — Telehealth: Payer: Self-pay | Admitting: *Deleted

## 2015-09-24 NOTE — Telephone Encounter (Signed)
Received vm call from pt's wife stating pt needs refill on lomotil & dilaudid & will p/u when they are here tues 09/28/15. Request call back on pt cell # [phone].  Note to Dr [name] & RN

## 2015-09-27 ENCOUNTER — Other Ambulatory Visit: Payer: Self-pay

## 2015-09-27 DIAGNOSIS — C9000 Multiple myeloma not having achieved remission: Secondary | ICD-10-CM

## 2015-09-27 MED ORDER — HYDROMORPHONE HCL 4 MG PO TABS: 4.0000 mg | ORAL_TABLET | ORAL | Status: DC | PRN

## 2015-09-27 MED ORDER — DIPHENOXYLATE-ATROPINE 2.5-0.025 MG PO TABS: 1.0000 | ORAL_TABLET | Freq: Four times a day (QID) | ORAL | Status: AC | PRN

## 2015-09-27 NOTE — Telephone Encounter (Signed)
Patient requesting dilaudid and lomotil.  Both prescription are appropriate for refills.  Prescriptions placed in the book with Thayer Headings, RN in the injection room.

## 2015-09-28 ENCOUNTER — Other Ambulatory Visit (HOSPITAL_BASED_OUTPATIENT_CLINIC_OR_DEPARTMENT_OTHER): Payer: Medicare Other

## 2015-09-28 ENCOUNTER — Ambulatory Visit (HOSPITAL_BASED_OUTPATIENT_CLINIC_OR_DEPARTMENT_OTHER): Payer: Medicare Other

## 2015-09-28 DIAGNOSIS — C9000 Multiple myeloma not having achieved remission: Secondary | ICD-10-CM | POA: Diagnosis present

## 2015-09-28 DIAGNOSIS — Z452 Encounter for adjustment and management of vascular access device: Secondary | ICD-10-CM

## 2015-09-28 LAB — COMPREHENSIVE METABOLIC PANEL (CC13)
ALBUMIN: 3.3 g/dL — AB (ref 3.5–5.0)
ALK PHOS: 56 U/L (ref 40–150)
ALT: 20 U/L (ref 0–55)
ANION GAP: 7 meq/L (ref 3–11)
AST: 26 U/L (ref 5–34)
BILIRUBIN TOTAL: 0.51 mg/dL (ref 0.20–1.20)
BUN: 26.5 mg/dL — ABNORMAL HIGH (ref 7.0–26.0)
CO2: 21 meq/L — AB (ref 22–29)
CREATININE: 1.4 mg/dL — AB (ref 0.7–1.3)
Calcium: 9.1 mg/dL (ref 8.4–10.4)
Chloride: 106 mEq/L (ref 98–109)
EGFR: 44 mL/min/{1.73_m2} — AB (ref >90)
Glucose: 101 mg/dl (ref 70–140)
Potassium: 5.1 mEq/L (ref 3.5–5.1)
SODIUM: 134 meq/L — AB (ref 136–145)
TOTAL PROTEIN: 6.2 g/dL — AB (ref 6.4–8.3)

## 2015-09-28 LAB — CBC WITH DIFFERENTIAL/PLATELET
BASO%: 1.5 % (ref 0.0–2.0)
BASOS ABS: 0.1 10*3/uL (ref 0.0–0.1)
EOS%: 15 % — ABNORMAL HIGH (ref 0.0–7.0)
Eosinophils Absolute: 0.6 10*3/uL — ABNORMAL HIGH (ref 0.0–0.5)
HCT: 27.7 % — ABNORMAL LOW (ref 38.4–49.9)
HGB: 9 g/dL — ABNORMAL LOW (ref 13.0–17.1)
LYMPH%: 30.7 % (ref 14.0–49.0)
MCH: 28 pg (ref 27.2–33.4)
MCHC: 32.4 g/dL (ref 32.0–36.0)
MCV: 86.5 fL (ref 79.3–98.0)
MONO#: 0.5 10*3/uL (ref 0.1–0.9)
MONO%: 12.4 % (ref 0.0–14.0)
NEUT#: 1.7 10*3/uL (ref 1.5–6.5)
NEUT%: 40.4 % (ref 39.0–75.0)
Platelets: 210 10*3/uL (ref 140–400)
RBC: 3.21 10*6/uL — AB (ref 4.20–5.82)
RDW: 19.9 % — ABNORMAL HIGH (ref 11.0–14.6)
WBC: 4.3 10*3/uL (ref 4.0–10.3)
lymph#: 1.3 10*3/uL (ref 0.9–3.3)

## 2015-09-28 MED ORDER — HEPARIN SOD (PORK) LOCK FLUSH 100 UNIT/ML IV SOLN
500.0000 [IU] | Freq: Once | INTRAVENOUS | Status: AC
  Administered 2015-09-28: 250 [IU] via INTRAVENOUS
  Filled 2015-09-28: qty 5

## 2015-09-28 MED ORDER — SODIUM CHLORIDE 0.9 % IJ SOLN
10.0000 mL | INTRAMUSCULAR | Status: DC | PRN
  Administered 2015-09-28: 10 mL via INTRAVENOUS
  Filled 2015-09-28: qty 10

## 2015-09-28 NOTE — Patient Instructions (Signed)
PICC Home Guide A peripherally inserted central catheter (PICC) is a long, thin, flexible tube that is inserted into a vein in the upper arm. It is a form of intravenous (IV) access. It is considered to be a "central" line because the tip of the PICC ends in a large vein in your chest. This large vein is called the superior vena cava (SVC). The PICC tip ends in the SVC because there is a lot of blood flow in the SVC. This allows medicines and IV fluids to be quickly distributed throughout the body. The PICC is inserted using a sterile technique by a specially trained nurse or physician. After the PICC is inserted, a chest X-ray exam is done to be sure it is in the correct place.  A PICC may be placed for different reasons, such as:  To give medicines and liquid nutrition that can only be given through a central line. Examples are:  Certain antibiotic treatments.  Chemotherapy.  Total parenteral nutrition (TPN).  To take frequent blood samples.  To give IV fluids and blood products.  If there is difficulty placing a peripheral intravenous (PIV) catheter. If taken care of properly, a PICC can remain in place for several months. A PICC can also allow a person to go home from the hospital early. Medicine and PICC care can be managed at home by a family member or home health care team. WHAT PROBLEMS CAN HAPPEN WHEN I HAVE A PICC? Problems with a PICC can occasionally occur. These may include the following:  A blood clot (thrombus) forming in or at the tip of the PICC. This can cause the PICC to become clogged. A clot-dissolving medicine called tissue plasminogen activator (tPA) can be given through the PICC to help break up the clot.  Inflammation of the vein (phlebitis) in which the PICC is placed. Signs of inflammation may include redness, pain at the insertion site, red streaks, or being able to feel a "cord" in the vein where the PICC is located.  Infection in the PICC or at the insertion  site. Signs of infection may include fever, chills, redness, swelling, or pus drainage from the PICC insertion site.  PICC movement (malposition). The PICC tip may move from its original position due to excessive physical activity, forceful coughing, sneezing, or vomiting.  A break or cut in the PICC. It is important to not use scissors near the PICC.  Nerve or tendon irritation or injury during PICC insertion. WHAT SHOULD I KEEP IN MIND ABOUT ACTIVITIES WHEN I HAVE A PICC?  You may bend your arm and move it freely. If your PICC is near or at the bend of your elbow, avoid activity with repeated motion at the elbow.  Rest at home for the remainder of the day following PICC line insertion.  Avoid lifting heavy objects as instructed by your health care provider.  Avoid using a crutch with the arm on the same side as your PICC. You may need to use a walker. WHAT SHOULD I KNOW ABOUT MY PICC DRESSING?  Keep your PICC bandage (dressing) clean and dry to prevent infection.  Ask your health care provider when you may shower. Ask your health care provider to teach you how to wrap the PICC when you do take a shower.  Change the PICC dressing as instructed by your health care provider.  Change your PICC dressing if it becomes loose or wet. WHAT SHOULD I KNOW ABOUT PICC CARE?  Check the PICC insertion site   daily for leakage, redness, swelling, or pain.  Do not take a bath, swim, or use hot tubs when you have a PICC. Cover PICC line with clear plastic wrap and tape to keep it dry while showering.  Flush the PICC as directed by your health care provider. Let your health care provider know right away if the PICC is difficult to flush or does not flush. Do not use force to flush the PICC.  Do not use a syringe that is less than 10 mL to flush the PICC.  Never pull or tug on the PICC.  Avoid blood pressure checks on the arm with the PICC.  Keep your PICC identification card with you at all  times.  Do not take the PICC out yourself. Only a trained clinical professional should remove the PICC. SEEK IMMEDIATE MEDICAL CARE IF:  Your PICC is accidentally pulled all the way out. If this happens, cover the insertion site with a bandage or gauze dressing. Do not throw the PICC away. Your health care provider will need to inspect it.  Your PICC was tugged or pulled and has partially come out. Do not  push the PICC back in.  There is any type of drainage, redness, or swelling where the PICC enters the skin.  You cannot flush the PICC, it is difficult to flush, or the PICC leaks around the insertion site when it is flushed.  You hear a "flushing" sound when the PICC is flushed.  You have pain, discomfort, or numbness in your arm, shoulder, or jaw on the same side as the PICC.  You feel your heart "racing" or skipping beats.  You notice a hole or tear in the PICC.  You develop chills or a fever. MAKE SURE YOU:   Understand these instructions.  Will watch your condition.  Will get help right away if you are not doing well or get worse.   This information is not intended to replace advice given to you by your health care provider. Make sure you discuss any questions you have with your health care provider.   Document Released: 05/13/2003 Document Revised: 11/27/2014 Document Reviewed: 07/14/2013 Elsevier Interactive Patient Education 2016 [employer]  

## 2015-09-29 LAB — KAPPA/LAMBDA LIGHT CHAINS
KAPPA LAMBDA RATIO: 24.39 — AB (ref 0.26–1.65)
Kappa free light chain: 54.4 mg/dL — ABNORMAL HIGH (ref 0.33–1.94)
Lambda Free Lght Chn: 2.23 mg/dL (ref 0.57–2.63)

## 2015-10-05 ENCOUNTER — Other Ambulatory Visit (HOSPITAL_BASED_OUTPATIENT_CLINIC_OR_DEPARTMENT_OTHER): Payer: Medicare Other

## 2015-10-05 ENCOUNTER — Ambulatory Visit (HOSPITAL_BASED_OUTPATIENT_CLINIC_OR_DEPARTMENT_OTHER): Payer: Medicare Other | Admitting: Oncology

## 2015-10-05 ENCOUNTER — Ambulatory Visit: Payer: Medicare Other

## 2015-10-05 ENCOUNTER — Ambulatory Visit (HOSPITAL_BASED_OUTPATIENT_CLINIC_OR_DEPARTMENT_OTHER): Payer: Medicare Other

## 2015-10-05 ENCOUNTER — Other Ambulatory Visit: Payer: Medicare Other

## 2015-10-05 VITALS — BP 114/42 | HR 65 | Temp 97.5°F | Resp 18 | Ht 70.0 in | Wt 186.0 lb

## 2015-10-05 DIAGNOSIS — D630 Anemia in neoplastic disease: Secondary | ICD-10-CM | POA: Diagnosis not present

## 2015-10-05 DIAGNOSIS — Z452 Encounter for adjustment and management of vascular access device: Secondary | ICD-10-CM

## 2015-10-05 DIAGNOSIS — C9002 Multiple myeloma in relapse: Secondary | ICD-10-CM | POA: Diagnosis present

## 2015-10-05 DIAGNOSIS — Z5111 Encounter for antineoplastic chemotherapy: Secondary | ICD-10-CM | POA: Diagnosis present

## 2015-10-05 DIAGNOSIS — C9000 Multiple myeloma not having achieved remission: Secondary | ICD-10-CM

## 2015-10-05 LAB — CBC WITH DIFFERENTIAL/PLATELET
BASO%: 1.7 % (ref 0.0–2.0)
Basophils Absolute: 0.1 10*3/uL (ref 0.0–0.1)
EOS%: 7.5 % — ABNORMAL HIGH (ref 0.0–7.0)
Eosinophils Absolute: 0.5 10*3/uL (ref 0.0–0.5)
HCT: 27.5 % — ABNORMAL LOW (ref 38.4–49.9)
HEMOGLOBIN: 8.7 g/dL — AB (ref 13.0–17.1)
LYMPH%: 24.1 % (ref 14.0–49.0)
MCH: 27.9 pg (ref 27.2–33.4)
MCHC: 31.6 g/dL — ABNORMAL LOW (ref 32.0–36.0)
MCV: 88.1 fL (ref 79.3–98.0)
MONO#: 0.8 10*3/uL (ref 0.1–0.9)
MONO%: 12.2 % (ref 0.0–14.0)
NEUT%: 54.5 % (ref 39.0–75.0)
NEUTROS ABS: 3.4 10*3/uL (ref 1.5–6.5)
PLATELETS: 214 10*3/uL (ref 140–400)
RBC: 3.12 10*6/uL — AB (ref 4.20–5.82)
RDW: 19.1 % — AB (ref 11.0–14.6)
WBC: 6.3 10*3/uL (ref 4.0–10.3)
lymph#: 1.5 10*3/uL (ref 0.9–3.3)

## 2015-10-05 LAB — COMPREHENSIVE METABOLIC PANEL (CC13)
ALBUMIN: 3.1 g/dL — AB (ref 3.5–5.0)
ALK PHOS: 58 U/L (ref 40–150)
ALT: 27 U/L (ref 0–55)
AST: 33 U/L (ref 5–34)
Anion Gap: 9 mEq/L (ref 3–11)
BUN: 27.2 mg/dL — ABNORMAL HIGH (ref 7.0–26.0)
CALCIUM: 8.8 mg/dL (ref 8.4–10.4)
CHLORIDE: 104 meq/L (ref 98–109)
CO2: 21 mEq/L — ABNORMAL LOW (ref 22–29)
Creatinine: 1.5 mg/dL — ABNORMAL HIGH (ref 0.7–1.3)
EGFR: 44 mL/min/{1.73_m2} — AB (ref >90)
Glucose: 99 mg/dl (ref 70–140)
POTASSIUM: 4.5 meq/L (ref 3.5–5.1)
SODIUM: 134 meq/L — AB (ref 136–145)
Total Bilirubin: 0.57 mg/dL (ref 0.20–1.20)
Total Protein: 6.3 g/dL — ABNORMAL LOW (ref 6.4–8.3)

## 2015-10-05 MED ORDER — DARBEPOETIN ALFA 500 MCG/ML IJ SOSY
500.0000 ug | PREFILLED_SYRINGE | Freq: Once | INTRAMUSCULAR | Status: AC
  Administered 2015-10-05: 500 ug via SUBCUTANEOUS
  Filled 2015-10-05: qty 1

## 2015-10-05 MED ORDER — CYCLOPHOSPHAMIDE CHEMO INJECTION 1 GM
300.0000 mg/m2 | Freq: Once | INTRAMUSCULAR | Status: AC
  Administered 2015-10-05: 620 mg via INTRAVENOUS
  Filled 2015-10-05: qty 31

## 2015-10-05 MED ORDER — SODIUM CHLORIDE 0.9 % IV SOLN
Freq: Once | INTRAVENOUS | Status: AC
  Administered 2015-10-05: 13:00:00 via INTRAVENOUS

## 2015-10-05 MED ORDER — SODIUM CHLORIDE 0.9 % IJ SOLN
10.0000 mL | INTRAMUSCULAR | Status: DC | PRN
  Administered 2015-10-05: 10 mL via INTRAVENOUS
  Filled 2015-10-05: qty 10

## 2015-10-05 MED ORDER — SODIUM CHLORIDE 0.9 % IJ SOLN
3.0000 mL | INTRAMUSCULAR | Status: DC | PRN
  Administered 2015-10-05: 14:00:00 via INTRAVENOUS
  Filled 2015-10-05: qty 10

## 2015-10-05 MED ORDER — HEPARIN SOD (PORK) LOCK FLUSH 100 UNIT/ML IV SOLN
250.0000 [IU] | Freq: Once | INTRAVENOUS | Status: AC | PRN
  Administered 2015-10-05: 250 [IU]
  Filled 2015-10-05: qty 5

## 2015-10-05 MED ORDER — SODIUM CHLORIDE 0.9 % IV SOLN
Freq: Once | INTRAVENOUS | Status: AC
  Administered 2015-10-05: 13:00:00 via INTRAVENOUS
  Filled 2015-10-05: qty 4

## 2015-10-05 NOTE — Progress Notes (Signed)
ID: Timothy [name]   DOB: [birth year]  MR#: [id number]  TMA#:[id number]  PCP:  SU: OTHER MD: Dorna Leitz, Alysia Penna, Georgia Duff, Geryl Councilman  CHIEF COMPLAINT: multiple myeloma  CURRENT THERAPY: cyclophosphamide, darbepoetin   MYELOMA HISTORY: From the earlier summary:  Patient presented in April 2007 with symptomatic slowly progressive anemia, with normal iron parameters, folic acid, and vitamin B12. Plans were made to obtain bone marrow aspirate and biopsy as an outpatient, however the patient was hospitalized shortly thereafter with a creatinine of 8.6. Renal biopsy demonstrated the presence of  light chain deposition disease. Bone marrow biopsy demonstrated 37% plasma cells. A skeletal survey showed small calvarial lytic lesions, mild osteopenia, and cervical spondylosis. With a well-established diagnosis of light-chain multiple myeloma Dr. Melodie Bouillon subsequent treatments are as detailed below.   INTERVAL HISTORY: Timothy [name] returns today for follow up of his multiple myeloma accompanied by his wife Timothy [name]. He continues of cyclophosphamide, which he receives every 3 weeks. He also receives darbepoetin every 3 weeks. He is tolerating those treatments with no side effects that he is aware of.   REVIEW OF SYSTEMS: Timothy [name] has been having more pain related to his right hip/right femur. It tends to occur at night. If he sleeps on his right side and then changes position the pain "bite" and it is fairly excruciating. It improves with the change in position. In the morning he takes to dialogue noted at 6 AM before getting out of bed. He has no problem with weightbearing on the right, however. Of course he has some back pain which is baseline, is fatigued, short of breath, and has the same symptoms he has from his multiple other chronic conditions. There are no other new problems however.  PAST MEDICAL HISTORY: Past Medical  History  Diagnosis Date  . ASCVD (arteriosclerotic cardiovascular disease)   . Gout   . Hypertension   . Lumbar disc disease     post laminectomy  . History of echocardiogram 11/02/2010    EF range of 30 to 35% / There is hypokinsesis of the basal-mild inferolateral myocardium  . CHF (congestive heart failure) ([place])   . Heart murmur   . Ischemic cardiomyopathy   . Renal insufficiency   . Peripheral neuropathy ([place])   . Hyperlipidemia   . SOB (shortness of breath)   . Fatigue   . Peripheral neuropathy ([place])   . Coronary artery disease     CABG 2002 by Dr. Roxy Manns with LIMA to LAD, SVG to intermediate, SVG to LCX, & SVG to PL. Last nuclear in 2012 showing large inferior scar with EF of 39%.   . Blood transfusion 10/2011; 07/2013    "multiple myeloma; when I broke my leg"  . History of shingles MARCH 2009    SHINGLE LESIONS WERE AROUND RIGHT EYE--PT HAS RESIDUAL ITCHING AROUND THE EYE.  . Osteomyelitis ([place])     s/p toe amputation  . Chronic systolic heart failure ([facility])     EF of 30% per echo 02/2012  . Protein malnutrition ([place])   . Critical lower limb ischemia   . Peripheral vascular disease ([place])   . Hx of cardiovascular stress test     Lexiscan Myoview (1/16):  Large inferior and apical scar; no ischemia, EF 27%;  High Risk  . Inferior myocardial infarction ([place]) 1986  . Pulmonary embolism ([place])     "when I had big toe amputated"  . Chronic anemia     "  due to the multiple myeloma"  . Chronic lower back pain   . Multiple myeloma ([place]) dx'd 04/2006    with recurrence of increasing problems  DR. Fordoche -ONCOLOGIST.  PT HAS BEEN OFF CHEMO SINCE HIS HOSPITALIZATION FEB 2013 FOR LEFT FOOT INFECTION  . Basal cell carcinoma     "chest left arm"   PAST SURGICAL HISTORY: Past Surgical History  Procedure Laterality Date  . Lumbar laminectomy    . Cardiac catheterization  06/20/2000    Severe coronary disease (totally occluded right artery, 90% left circumflex, 50-60% intermediate,  and 90-95% ostial left anterior descending)   . Coronary artery bypass graft  2002    x4 / Left internal mammary artery to the LAD.  / Saphenous vein graft to the right posterolateral.  /  Saphenous vein graft to  the ramus intermedius. / Saphenous vein graft to the circumflex marginal     . Amputation  03/06/2012    Procedure: AMPUTATION RAY;  Surgeon: Alta Corning, MD;  Location: [facility];  Service: Orthopedics;  Laterality: Left;  First Ray Amputation  . Intramedullary (im) nail intertrochanteric Right 08/17/2013    Procedure: INTRAMEDULLARY (IM) NAIL INTERTROCHANTRIC;  Surgeon: Marybelle Killings, MD;  Location: [facility];  Service: Orthopedics;  Laterality: Right;  . Endarterectomy femoral Left 03/24/2014    Procedure: Left Common Femoral  Artery Endarterectomy with Vein Patch Angioplasty,  Ultrasound ;  Surgeon: Angelia Mould, MD;  Location: [facility];  Service: Vascular;  Laterality: Left;  . Cataract extraction w/ intraocular lens  implant, bilateral Bilateral   . Lower extremity angiogram N/A 03/23/2014    Procedure: LOWER EXTREMITY ANGIOGRAM;  Surgeon: Lorretta Harp, MD;  Location: [facility] CATH LAB;  Service: Cardiovascular;  Laterality: N/A;  . Peripheral vascular catheterization N/A 05/27/2015    Procedure: Lower Extremity Angiography;  Surgeon: Lorretta Harp, MD;  Location: [facility] CV LAB;  Service: Cardiovascular;  Laterality: N/A;  . Peripheral vascular catheterization  06/03/2015    Procedure: Peripheral Vascular Intervention;  Surgeon: Lorretta Harp, MD;  Location: [facility] CV LAB;  Service: Cardiovascular;;  Lt Common Iliac  . Tonsillectomy  ~ 1936  . Inguinal hernia repair Bilateral   . Fracture surgery    . Back surgery    . Basal cell carcinoma excision      "chest left arm"  . 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 He and his wife Timothy [name] are the only ones at home.    ADVANCED DIRECTIVES: Timothy [name] has wife is his healthcare part of attorney  HEALTH MAINTENANCE: Social History  Substance Use Topics  . Smoking status: Former Smoker -- 1.00 packs/day for 20 years    Types: Cigarettes    Quit date: 11/20/1984  . Smokeless tobacco: Never Used  . Alcohol Use: Yes     Comment: 06/03/2015 "might have a drink a few times/year"     Colonoscopy:  Bone density:  Lipid panel:  Allergies  Allergen Reactions  . Morphine And Related Other (See Comments)    Agitated with morphine drip    Current Outpatient Prescriptions  Medication Sig Dispense Refill  . acyclovir (ZOVIRAX) 400 MG tablet Take 1 tablet (400 mg total) by mouth 2 (two) times daily. 60 tablet 2  . acyclovir (ZOVIRAX) 400 MG tablet TAKE 1 TABLET TWICE  A DAY 60 tablet 1  . allopurinol (ZYLOPRIM) 300 MG tablet TAKE ONE-HALF (1/2) TABLET DAILY 90 tablet 2  . aspirin 81 MG chewable tablet Chew 1 tablet (81 mg total) by mouth daily.    Marland Kitchen atorvastatin (LIPITOR) 10 MG tablet TAKE 1 TABLET EVERY EVENING 90 tablet 2  . B Complex-Biotin-FA (B COMPLETE PO) Take 1 tablet by mouth daily.     . cadexomer iodine (IODOSORB) 0.9 % gel Apply 1 application topically daily.    . carvedilol (COREG) 6.25 MG tablet TAKE 1 TABLET TWICE A DAY WITH MEALS 180 tablet 2  . cephALEXin (KEFLEX) 500 MG capsule Take 500 mg by mouth 3 (three) times daily.    . clopidogrel (PLAVIX) 75 MG tablet Take 1 tablet (75 mg total) by mouth daily with breakfast. 90 tablet 3  . Coenzyme Q10 (CO Q-10 PO) Take 1 tablet by mouth every morning.     . darbepoetin (ARANESP) 200 MCG/0.4ML SOLN injection Inject 200 mcg into the skin as directed.    . diphenoxylate-atropine (LOMOTIL) 2.5-0.025 MG tablet Take 1 tablet by mouth 4 (four) times daily as needed for diarrhea or loose stools. 30 tablet 0  . furosemide (LASIX) 40 MG tablet Take 1 tablet (40 mg  total) by mouth daily. 90 tablet 1  . Heparin Lock Flush (HEPARIN FLUSH, PORCINE,) 100 UNIT/ML injection 2.67ml ( 250 ) per picc line flush. Use 1 syringe per port. 30 Syringe 3  . HYDROmorphone (DILAUDID) 4 MG tablet Take 1 tablet (4 mg total) by mouth every 4 (four) hours as needed for severe pain. 120 tablet 0  . isosorbide mononitrate (IMDUR) 30 MG 24 hr tablet Take 1 tablet (30 mg total) by mouth daily. 90 tablet 3  . lisinopril (PRINIVIL,ZESTRIL) 20 MG tablet TAKE 1 TABLET DAILY 90 tablet 2  . Multiple Vitamin (MULTIVITAMIN) capsule Take 1 capsule by mouth every morning.     . nitroGLYCERIN (NITROSTAT) 0.4 MG SL tablet Place 1 tablet (0.4 mg total) under the tongue every 5 (five) minutes as needed for chest pain. 25 tablet 3  . ondansetron (ZOFRAN) 8 MG tablet Take 1 tablet (8 mg total) by mouth 2 (two) times daily as needed (Nausea or vomiting). 30 tablet 1  . polyethylene glycol (MIRALAX / GLYCOLAX) packet Take 17 g by mouth daily as needed. 14 each 0  . rOPINIRole (REQUIP) 3 MG tablet Take 1 tablet (3 mg total) by mouth at bedtime. 90 tablet 4  . silver sulfADIAZINE (SSD) 1 % cream Apply topically daily. 50 g 0  . sodium chloride 0.9 % SOLN 250 mL with cyclophosphamide 1 G SOLR 300 mg/m2 Inject 300 mg/m2 into the vein every 14 (fourteen) days.    . Sodium Chloride Flush 0.9 % SOLN injection Inject 10 mLs into the vein as directed. 30 Syringe 3  . tamsulosin (FLOMAX) 0.4 MG CAPS capsule Take 0.4 mg by mouth at bedtime.    Marland Kitchen tobramycin (TOBREX) 0.3 % ophthalmic solution Place 1 drop into both eyes daily as needed.  4  . vitamin C (ASCORBIC ACID) 500 MG tablet Take 250 mg by mouth 2 (two) times daily.     . Zoledronic Acid (ZOMETA IV) Inject 1 each into the vein once.     No current facility-administered medications for this visit.   Facility-Administered Medications Ordered in Other Visits  Medication Dose Route Frequency Provider Last Rate Last Dose  . sodium chloride 0.9 % injection 10  mL  10 mL Intravenous PRN Sarajane Jews  C Magrinat, MD   10 mL at 10/05/15 1100    Objective: Older white man who appears stated age 82 Vitals:   10/05/15 1118  BP: 114/42  Pulse: 65  Temp: 97.5 F (36.4 C)  Resp: 18        Body mass index is 26.69 kg/(m^2).    ECOG FS: 2 Filed Weights   10/05/15 1118  Weight: 186 lb (84.369 kg)   Sclerae unicteric, EOMs intact Oropharynx clear, dentition in good repair No cervical or supraclavicular adenopathy Lungs no rales or rhonchi Heart regular rate and rhythm Abd soft, nontender, positive bowel sounds MSK bilateral lower extremity edema as previously noted, grade 1. Palpation of the right hip and right femur is unremarkable Neuro: nonfocal, well oriented, appropriate affect     LAB RESULTS:  Results for Timothy, [name] (MRN [id number]) as of 10/05/2015 11:25  Ref. Range 06/22/2015 11:48 07/06/2015 12:07 08/24/2015 10:24 09/14/2015 09:47 09/28/2015 10:01  Kappa free light chain Latest Ref Range: 0.33-1.94 mg/dL  70.50 (H) 48.70 (H)  54.40 (H)  Lambda Free Lght Chn Latest Ref Range: 0.57-2.63 mg/dL  2.33 1.89  2.23  Kappa:Lambda Ratio Latest Ref Range: 0.26-1.65   30.26 (H) 25.77 (H)  24.39 (H)    Lab Results  Component Value Date   WBC 6.3 10/05/2015   NEUTROABS 3.4 10/05/2015   HGB 8.7* 10/05/2015   HCT 27.5* 10/05/2015   MCV 88.1 10/05/2015   PLT 214 10/05/2015      Chemistry      Component Value Date/Time   NA 134* 09/28/2015 1001   NA 139 06/04/2015 0511   K 5.1 09/28/2015 1001   K 4.4 06/04/2015 0511   CL 110 06/04/2015 0511   CL 98 05/12/2013 1048   CO2 21* 09/28/2015 1001   CO2 22 06/04/2015 0511   BUN 26.5* 09/28/2015 1001   BUN 31* 06/04/2015 0511   CREATININE 1.4* 09/28/2015 1001   CREATININE 1.25* 06/04/2015 0511      Component Value Date/Time   CALCIUM 9.1 09/28/2015 1001   CALCIUM 8.3* 06/04/2015 0511   ALKPHOS 56 09/28/2015 1001   ALKPHOS 42 03/25/2014 2115   AST 26 09/28/2015 1001   AST 25 03/25/2014  2115   ALT 20 09/28/2015 1001   ALT 10 03/25/2014 2115   BILITOT 0.51 09/28/2015 1001   BILITOT 0.7 03/25/2014 2115      STUDIES: No results found.   ASSESSMENT: 81 y.o.  with kappa light chain multiple myeloma   (1) presenting April 2007 with anemia and renal failure; renal Bx showing free kappa light chain deposition; bone marrow biopsy showing 37% plasma cells, with normal cytogenetics and FISH;bone survey showing skull lytic lesions; treated with   (2) thalidomide (200 mg/d) and dexamethasone (40 mg/d x4d Q28d) June through Nov 2007, with good response, but poor tolerance;   (3) thalidomide decreased to 100 mg/d, dexamethasone continued, bortezomib (IV) added, Dec 2007 to March 2008   (4) treatment interrupted by multiple complications (peripheral neuropathy, pulmonary embolism, diverticular abscess, CN V zoster, severe DDD, congestive heart failure, rising PSA); maintenance zolendronic acid through March 2012   (5) progression April 2012, treated initially with dexamethasone alone, poorly tolerated;   (6) bortezomib (sq) resumed July 2012; cyclophosphamide and dexamethasone added Sept 2012; zolendronic acid changed to Q 14months; all treatments held as of March 2013 due to the development of the Left foot ulcer described above  (7) anemia, on aranesp December 2012 to August 2013; resumed Q14d starting May  2015, with slowly rising reticulocyte count  (8) status post Right trochanteric nail with proximal and distal interlock DePuy 11 mm one-pin lag screw, 44 mm distal interlock 08/09/2013 for a right comminuted intertrochanteric fracture  (9) status post removal of the first 2 toes left foot  (10) Left common femoral artery endarterectomy with vein patch angioplasty using left greater saphenous vein 03/24/2014  (11) status post fall with injury to the right hip, large hematoma, requiring [facility] until 07/30/2014  (12) on 10/09/2014 starting  cyclophosphamide at 300 mg/m every 14 days and dexamethasone 20 mg daily 2 days each week  (13) SIADH: On fluid restriction, reduced to 1.5L on 12/04/14 because of drop in sodium  (14) chronic systolic congestive heart failure, with EF  35% per echo, with history CAD s/p CABG 2002  (15) CKD stage III  (16) peripheral vascular disease, status post outpatient stage diamondback orbital rotational atherectomy along with PTCA and placement of Viabahn covered stent 06/03/2015 for high grade calcified prox L common illiac artery stenosis noted on cath 05/27/2015  (17) poor access: Status post right-sided PICC placement 07/06/2015  (a) dressing changes at [facility] every Tuesday, wife flushes PICC every Thurs and SAT  PLAN:   Timothy [name] is doing remarkably well as far as his myeloma is concerned. He is tolerating the cyclophosphamide every 3 weeks, and his kappa/lambda ratio continues to slowly move in the right direction.  He remains anemic despite the Aranesp. We discussed possible transfusion today. If his baseline shortness of breath or fatigue gets much worse he will let us know. Just in case I have added a type and screen to the December 6 set of labs.  I am not sure why he would be having more pain right now in his right hip/right femur area. Of course that is where he had his prior fracture and surgery. He is able to weight-bear without difficulty. The pain occurs at night when he sleeps on that side and changes position. Just in case I am setting him up for plain films of that area but he does not 1 toes done until next week when he returns for his routine PICC flush. Those orders are written.  He will see as again in 3 weeks. He knows to call for any problems that may develop before that visit. MAGRINAT,GUSTAV C    10/05/2015

## 2015-10-05 NOTE — Patient Instructions (Signed)
PICC Home Guide A peripherally inserted central catheter (PICC) is a long, thin, flexible tube that is inserted into a vein in the upper arm. It is a form of intravenous (IV) access. It is considered to be a "central" line because the tip of the PICC ends in a large vein in your chest. This large vein is called the superior vena cava (SVC). The PICC tip ends in the SVC because there is a lot of blood flow in the SVC. This allows medicines and IV fluids to be quickly distributed throughout the body. The PICC is inserted using a sterile technique by a specially trained nurse or physician. After the PICC is inserted, a chest X-ray exam is done to be sure it is in the correct place.  A PICC may be placed for different reasons, such as:  To give medicines and liquid nutrition that can only be given through a central line. Examples are:  Certain antibiotic treatments.  Chemotherapy.  Total parenteral nutrition (TPN).  To take frequent blood samples.  To give IV fluids and blood products.  If there is difficulty placing a peripheral intravenous (PIV) catheter. If taken care of properly, a PICC can remain in place for several months. A PICC can also allow a person to go home from the hospital early. Medicine and PICC care can be managed at home by a family member or home health care team. WHAT PROBLEMS CAN HAPPEN WHEN I HAVE A PICC? Problems with a PICC can occasionally occur. These may include the following:  A blood clot (thrombus) forming in or at the tip of the PICC. This can cause the PICC to become clogged. A clot-dissolving medicine called tissue plasminogen activator (tPA) can be given through the PICC to help break up the clot.  Inflammation of the vein (phlebitis) in which the PICC is placed. Signs of inflammation may include redness, pain at the insertion site, red streaks, or being able to feel a "cord" in the vein where the PICC is located.  Infection in the PICC or at the insertion  site. Signs of infection may include fever, chills, redness, swelling, or pus drainage from the PICC insertion site.  PICC movement (malposition). The PICC tip may move from its original position due to excessive physical activity, forceful coughing, sneezing, or vomiting.  A break or cut in the PICC. It is important to not use scissors near the PICC.  Nerve or tendon irritation or injury during PICC insertion. WHAT SHOULD I KEEP IN MIND ABOUT ACTIVITIES WHEN I HAVE A PICC?  You may bend your arm and move it freely. If your PICC is near or at the bend of your elbow, avoid activity with repeated motion at the elbow.  Rest at home for the remainder of the day following PICC line insertion.  Avoid lifting heavy objects as instructed by your health care provider.  Avoid using a crutch with the arm on the same side as your PICC. You may need to use a walker. WHAT SHOULD I KNOW ABOUT MY PICC DRESSING?  Keep your PICC bandage (dressing) clean and dry to prevent infection.  Ask your health care provider when you may shower. Ask your health care provider to teach you how to wrap the PICC when you do take a shower.  Change the PICC dressing as instructed by your health care provider.  Change your PICC dressing if it becomes loose or wet. WHAT SHOULD I KNOW ABOUT PICC CARE?  Check the PICC insertion site   daily for leakage, redness, swelling, or pain.  Do not take a bath, swim, or use hot tubs when you have a PICC. Cover PICC line with clear plastic wrap and tape to keep it dry while showering.  Flush the PICC as directed by your health care provider. Let your health care provider know right away if the PICC is difficult to flush or does not flush. Do not use force to flush the PICC.  Do not use a syringe that is less than 10 mL to flush the PICC.  Never pull or tug on the PICC.  Avoid blood pressure checks on the arm with the PICC.  Keep your PICC identification card with you at all  times.  Do not take the PICC out yourself. Only a trained clinical professional should remove the PICC. SEEK IMMEDIATE MEDICAL CARE IF:  Your PICC is accidentally pulled all the way out. If this happens, cover the insertion site with a bandage or gauze dressing. Do not throw the PICC away. Your health care provider will need to inspect it.  Your PICC was tugged or pulled and has partially come out. Do not  push the PICC back in.  There is any type of drainage, redness, or swelling where the PICC enters the skin.  You cannot flush the PICC, it is difficult to flush, or the PICC leaks around the insertion site when it is flushed.  You hear a "flushing" sound when the PICC is flushed.  You have pain, discomfort, or numbness in your arm, shoulder, or jaw on the same side as the PICC.  You feel your heart "racing" or skipping beats.  You notice a hole or tear in the PICC.  You develop chills or a fever. MAKE SURE YOU:   Understand these instructions.  Will watch your condition.  Will get help right away if you are not doing well or get worse.   This information is not intended to replace advice given to you by your health care provider. Make sure you discuss any questions you have with your health care provider.   Document Released: 05/13/2003 Document Revised: 11/27/2014 Document Reviewed: 07/14/2013 Elsevier Interactive Patient Education 2016 [employer]  

## 2015-10-05 NOTE — Progress Notes (Signed)
Reviewed labs. OK to treat with Creatinine of 1.5 per Dr. [name]

## 2015-10-05 NOTE — Addendum Note (Signed)
Addended by: Aura Fey A on: 10/05/2015 02:31 PM   Modules accepted: Orders

## 2015-10-05 NOTE — Addendum Note (Signed)
Addended by: Adalberto Cole on: 10/05/2015 12:54 PM   Modules accepted: Orders

## 2015-10-05 NOTE — Patient Instructions (Signed)
[facility] Discharge Instructions for Patients Receiving Chemotherapy  Today you received the following chemotherapy agents: Cytoxan.  To help prevent nausea and vomiting after your treatment, we encourage you to take your nausea medication: Zofran 8 mg every 12 hours as needed.   If you develop nausea and vomiting that is not controlled by your nausea medication, call the clinic.   BELOW ARE SYMPTOMS THAT SHOULD BE REPORTED IMMEDIATELY:  *FEVER GREATER THAN 100.5 F  *CHILLS WITH OR WITHOUT FEVER  NAUSEA AND VOMITING THAT IS NOT CONTROLLED WITH YOUR NAUSEA MEDICATION  *UNUSUAL SHORTNESS OF BREATH  *UNUSUAL BRUISING OR BLEEDING  TENDERNESS IN MOUTH AND THROAT WITH OR WITHOUT PRESENCE OF ULCERS  *URINARY PROBLEMS  *BOWEL PROBLEMS  UNUSUAL RASH Items with * indicate a potential emergency and should be followed up as soon as possible.  Feel free to call the clinic you have any questions or concerns. The clinic phone number is [phone].  Please show the CHEMO ALERT CARD at check-in to the Emergency Department and triage nurse.   

## 2015-10-08 ENCOUNTER — Other Ambulatory Visit: Payer: Self-pay | Admitting: *Deleted

## 2015-10-08 ENCOUNTER — Encounter: Payer: Self-pay | Admitting: Oncology

## 2015-10-08 DIAGNOSIS — C9000 Multiple myeloma not having achieved remission: Secondary | ICD-10-CM

## 2015-10-08 MED ORDER — HYDROMORPHONE HCL 4 MG PO TABS: 4.0000 mg | ORAL_TABLET | ORAL | Status: DC | PRN

## 2015-10-09 ENCOUNTER — Other Ambulatory Visit: Payer: Self-pay | Admitting: Cardiology

## 2015-10-10 ENCOUNTER — Encounter: Payer: Self-pay | Admitting: Podiatry

## 2015-10-11 ENCOUNTER — Other Ambulatory Visit: Payer: Self-pay | Admitting: *Deleted

## 2015-10-11 ENCOUNTER — Telehealth: Payer: Self-pay | Admitting: *Deleted

## 2015-10-11 DIAGNOSIS — C9002 Multiple myeloma in relapse: Secondary | ICD-10-CM

## 2015-10-11 MED ORDER — ROPINIROLE HCL 3 MG PO TABS: 3.0000 mg | ORAL_TABLET | Freq: Every day | ORAL | Status: AC

## 2015-10-11 NOTE — Telephone Encounter (Signed)
EMail message from pt's wife, Timothy [name] request refills of Requip.  Dr. Jacqualyn Posey states refill as previous with prn 1 year.

## 2015-10-11 NOTE — Telephone Encounter (Signed)
Timothy M Martinique, MD at 07/01/2015 5:46 PM  furosemide (LASIX) 40 MG tablet Take 1 tablet (40 mg total) by mouth daily I have recommended continuing lasix 40 mg daily.

## 2015-10-12 ENCOUNTER — Other Ambulatory Visit (HOSPITAL_BASED_OUTPATIENT_CLINIC_OR_DEPARTMENT_OTHER): Payer: Medicare Other

## 2015-10-12 ENCOUNTER — Other Ambulatory Visit: Payer: Self-pay | Admitting: Oncology

## 2015-10-12 ENCOUNTER — Ambulatory Visit (HOSPITAL_BASED_OUTPATIENT_CLINIC_OR_DEPARTMENT_OTHER): Payer: Medicare Other

## 2015-10-12 ENCOUNTER — Ambulatory Visit: Payer: Medicare Other

## 2015-10-12 ENCOUNTER — Ambulatory Visit (HOSPITAL_COMMUNITY)
Admission: RE | Admit: 2015-10-12 | Discharge: 2015-10-12 | Disposition: A | Discharge status: Home / Self Care | Payer: Medicare Other | Source: Ambulatory Visit | Attending: Oncology | Admitting: Oncology

## 2015-10-12 ENCOUNTER — Telehealth: Payer: Self-pay | Admitting: *Deleted

## 2015-10-12 DIAGNOSIS — C9002 Multiple myeloma in relapse: Secondary | ICD-10-CM | POA: Diagnosis present

## 2015-10-12 DIAGNOSIS — M25551 Pain in right hip: Secondary | ICD-10-CM | POA: Diagnosis not present

## 2015-10-12 DIAGNOSIS — C9000 Multiple myeloma not having achieved remission: Secondary | ICD-10-CM

## 2015-10-12 DIAGNOSIS — D630 Anemia in neoplastic disease: Secondary | ICD-10-CM | POA: Diagnosis not present

## 2015-10-12 LAB — COMPREHENSIVE METABOLIC PANEL (CC13)
ALT: 20 U/L (ref 0–55)
ANION GAP: 10 meq/L (ref 3–11)
AST: 31 U/L (ref 5–34)
Albumin: 3.4 g/dL — ABNORMAL LOW (ref 3.5–5.0)
Alkaline Phosphatase: 57 U/L (ref 40–150)
BILIRUBIN TOTAL: 0.55 mg/dL (ref 0.20–1.20)
BUN: 27.9 mg/dL — ABNORMAL HIGH (ref 7.0–26.0)
CALCIUM: 9.6 mg/dL (ref 8.4–10.4)
CO2: 20 mEq/L — ABNORMAL LOW (ref 22–29)
CREATININE: 1.4 mg/dL — AB (ref 0.7–1.3)
Chloride: 107 mEq/L (ref 98–109)
EGFR: 47 mL/min/{1.73_m2} — AB (ref >90)
Glucose: 83 mg/dl (ref 70–140)
Potassium: 4.8 mEq/L (ref 3.5–5.1)
Sodium: 137 mEq/L (ref 136–145)
TOTAL PROTEIN: 6.6 g/dL (ref 6.4–8.3)

## 2015-10-12 LAB — CBC WITH DIFFERENTIAL/PLATELET
BASO%: 2.6 % — AB (ref 0.0–2.0)
Basophils Absolute: 0.2 10*3/uL — ABNORMAL HIGH (ref 0.0–0.1)
EOS ABS: 0.6 10*3/uL — AB (ref 0.0–0.5)
EOS%: 9.8 % — ABNORMAL HIGH (ref 0.0–7.0)
HEMATOCRIT: 28 % — AB (ref 38.4–49.9)
HGB: 9 g/dL — ABNORMAL LOW (ref 13.0–17.1)
LYMPH#: 1.6 10*3/uL (ref 0.9–3.3)
LYMPH%: 25.3 % (ref 14.0–49.0)
MCH: 27.8 pg (ref 27.2–33.4)
MCHC: 32.1 g/dL (ref 32.0–36.0)
MCV: 86.7 fL (ref 79.3–98.0)
MONO#: 0.4 10*3/uL (ref 0.1–0.9)
MONO%: 6.4 % (ref 0.0–14.0)
NEUT#: 3.5 10*3/uL (ref 1.5–6.5)
NEUT%: 55.9 % (ref 39.0–75.0)
PLATELETS: 332 10*3/uL (ref 140–400)
RBC: 3.23 10*6/uL — ABNORMAL LOW (ref 4.20–5.82)
RDW: 20.4 % — ABNORMAL HIGH (ref 11.0–14.6)
WBC: 6.3 10*3/uL (ref 4.0–10.3)

## 2015-10-12 MED ORDER — HEPARIN SOD (PORK) LOCK FLUSH 100 UNIT/ML IV SOLN
250.0000 [IU] | INTRAVENOUS | Status: AC | PRN
  Administered 2015-10-12: 250 [IU]
  Filled 2015-10-12: qty 5

## 2015-10-12 MED ORDER — SODIUM CHLORIDE 0.9 % IJ SOLN
10.0000 mL | INTRAMUSCULAR | Status: AC | PRN
  Administered 2015-10-12: 10 mL
  Filled 2015-10-12: qty 10

## 2015-10-12 MED ORDER — DARBEPOETIN ALFA 500 MCG/ML IJ SOSY
500.0000 ug | PREFILLED_SYRINGE | Freq: Once | INTRAMUSCULAR | Status: AC
  Administered 2015-10-12: 500 ug via SUBCUTANEOUS
  Filled 2015-10-12: qty 1

## 2015-10-12 NOTE — Patient Instructions (Signed)

## 2015-10-12 NOTE — Telephone Encounter (Signed)
Timothy [name] with Timothy [name] radiology called asking if Timothy [name] Rt. Hip order can be changed to a regular hip xray.  Called Dr. [name] to clarify what test he wants for this patient.   "Plain Hip xray is what's needed"  Verbal order received and read back from Dr. [name].  Will enter this order.

## 2015-10-19 ENCOUNTER — Ambulatory Visit (HOSPITAL_BASED_OUTPATIENT_CLINIC_OR_DEPARTMENT_OTHER): Payer: Medicare Other

## 2015-10-19 DIAGNOSIS — C9000 Multiple myeloma not having achieved remission: Secondary | ICD-10-CM

## 2015-10-19 DIAGNOSIS — Z452 Encounter for adjustment and management of vascular access device: Secondary | ICD-10-CM | POA: Diagnosis present

## 2015-10-19 MED ORDER — SODIUM CHLORIDE 0.9 % IJ SOLN
10.0000 mL | INTRAMUSCULAR | Status: AC | PRN
  Administered 2015-10-19: 10 mL
  Filled 2015-10-19: qty 10

## 2015-10-19 MED ORDER — HEPARIN SOD (PORK) LOCK FLUSH 100 UNIT/ML IV SOLN
250.0000 [IU] | INTRAVENOUS | Status: AC | PRN
  Administered 2015-10-19: 250 [IU]
  Filled 2015-10-19: qty 5

## 2015-10-19 NOTE — Progress Notes (Signed)
Patient had a skin tear next to distal one third of PICC line dressing.  Dressing removed and replaced with Op-Site.  Area dressed with neosporing ointment per patient request and small gauze laid over it before PICC line dressing wrapped.  Patient and wife will keep an eye on this . Hopefully Op-Site will be more beneficial to skin.  Otherwise PICC line site was unremarkable.

## 2015-10-26 ENCOUNTER — Other Ambulatory Visit (HOSPITAL_BASED_OUTPATIENT_CLINIC_OR_DEPARTMENT_OTHER): Payer: Medicare Other

## 2015-10-26 ENCOUNTER — Ambulatory Visit (HOSPITAL_BASED_OUTPATIENT_CLINIC_OR_DEPARTMENT_OTHER): Payer: Medicare Other | Admitting: Nurse Practitioner

## 2015-10-26 ENCOUNTER — Telehealth: Payer: Self-pay | Admitting: Nurse Practitioner

## 2015-10-26 ENCOUNTER — Telehealth: Payer: Self-pay | Admitting: Oncology

## 2015-10-26 ENCOUNTER — Other Ambulatory Visit: Payer: Self-pay | Admitting: *Deleted

## 2015-10-26 ENCOUNTER — Ambulatory Visit: Payer: Medicare Other

## 2015-10-26 ENCOUNTER — Other Ambulatory Visit: Payer: Medicare Other

## 2015-10-26 ENCOUNTER — Encounter: Payer: Self-pay | Admitting: Nurse Practitioner

## 2015-10-26 ENCOUNTER — Ambulatory Visit (HOSPITAL_BASED_OUTPATIENT_CLINIC_OR_DEPARTMENT_OTHER): Payer: Medicare Other

## 2015-10-26 VITALS — BP 123/45 | HR 86 | Temp 97.5°F | Resp 18 | Ht 70.0 in | Wt 184.8 lb

## 2015-10-26 DIAGNOSIS — C9000 Multiple myeloma not having achieved remission: Secondary | ICD-10-CM | POA: Diagnosis present

## 2015-10-26 DIAGNOSIS — Z5111 Encounter for antineoplastic chemotherapy: Secondary | ICD-10-CM | POA: Diagnosis present

## 2015-10-26 DIAGNOSIS — D630 Anemia in neoplastic disease: Secondary | ICD-10-CM

## 2015-10-26 DIAGNOSIS — Z452 Encounter for adjustment and management of vascular access device: Secondary | ICD-10-CM

## 2015-10-26 LAB — CBC WITH DIFFERENTIAL/PLATELET
BASO%: 1.6 % (ref 0.0–2.0)
Basophils Absolute: 0.1 10*3/uL (ref 0.0–0.1)
EOS%: 7.5 % — AB (ref 0.0–7.0)
Eosinophils Absolute: 0.5 10*3/uL (ref 0.0–0.5)
HEMATOCRIT: 31.8 % — AB (ref 38.4–49.9)
HGB: 9.8 g/dL — ABNORMAL LOW (ref 13.0–17.1)
LYMPH%: 25.7 % (ref 14.0–49.0)
MCH: 27 pg — AB (ref 27.2–33.4)
MCHC: 30.8 g/dL — AB (ref 32.0–36.0)
MCV: 87.6 fL (ref 79.3–98.0)
MONO#: 0.9 10*3/uL (ref 0.1–0.9)
MONO%: 13.3 % (ref 0.0–14.0)
NEUT%: 51.9 % (ref 39.0–75.0)
NEUTROS ABS: 3.5 10*3/uL (ref 1.5–6.5)
Platelets: 236 10*3/uL (ref 140–400)
RBC: 3.63 10*6/uL — AB (ref 4.20–5.82)
RDW: 20 % — ABNORMAL HIGH (ref 11.0–14.6)
WBC: 6.8 10*3/uL (ref 4.0–10.3)
lymph#: 1.8 10*3/uL (ref 0.9–3.3)
nRBC: 0 % (ref 0–0)

## 2015-10-26 LAB — COMPREHENSIVE METABOLIC PANEL
ALT: 20 U/L (ref 0–55)
AST: 29 U/L (ref 5–34)
Albumin: 3.5 g/dL (ref 3.5–5.0)
Alkaline Phosphatase: 67 U/L (ref 40–150)
Anion Gap: 10 mEq/L (ref 3–11)
BUN: 28.1 mg/dL — ABNORMAL HIGH (ref 7.0–26.0)
CO2: 23 mEq/L (ref 22–29)
Calcium: 9.5 mg/dL (ref 8.4–10.4)
Chloride: 106 mEq/L (ref 98–109)
Creatinine: 1.6 mg/dL — ABNORMAL HIGH (ref 0.7–1.3)
EGFR: 40 mL/min/{1.73_m2} — ABNORMAL LOW (ref >90)
Glucose: 104 mg/dl (ref 70–140)
Potassium: 4.4 mEq/L (ref 3.5–5.1)
Sodium: 139 mEq/L (ref 136–145)
Total Bilirubin: 0.52 mg/dL (ref 0.20–1.20)
Total Protein: 7 g/dL (ref 6.4–8.3)

## 2015-10-26 LAB — HOLD TUBE, BLOOD BANK

## 2015-10-26 MED ORDER — SODIUM CHLORIDE 0.9 % IJ SOLN
10.0000 mL | INTRAMUSCULAR | Status: DC | PRN
  Administered 2015-10-26: 10 mL
  Filled 2015-10-26: qty 10

## 2015-10-26 MED ORDER — SODIUM CHLORIDE 0.9 % IV SOLN
Freq: Once | INTRAVENOUS | Status: AC
  Administered 2015-10-26: 12:00:00 via INTRAVENOUS

## 2015-10-26 MED ORDER — HEPARIN SOD (PORK) LOCK FLUSH 100 UNIT/ML IV SOLN
500.0000 [IU] | Freq: Once | INTRAVENOUS | Status: DC
Start: 2015-10-26 — End: 2015-10-26
  Filled 2015-10-26: qty 5

## 2015-10-26 MED ORDER — HEPARIN SOD (PORK) LOCK FLUSH 100 UNIT/ML IV SOLN
500.0000 [IU] | Freq: Once | INTRAVENOUS | Status: AC | PRN
  Administered 2015-10-26: 250 [IU]
  Filled 2015-10-26: qty 5

## 2015-10-26 MED ORDER — SODIUM CHLORIDE 0.9 % IV SOLN
Freq: Once | INTRAVENOUS | Status: AC
  Administered 2015-10-26: 13:00:00 via INTRAVENOUS
  Filled 2015-10-26: qty 4

## 2015-10-26 MED ORDER — DARBEPOETIN ALFA 500 MCG/ML IJ SOSY
500.0000 ug | PREFILLED_SYRINGE | Freq: Once | INTRAMUSCULAR | Status: AC
  Administered 2015-10-26: 500 ug via SUBCUTANEOUS
  Filled 2015-10-26: qty 1

## 2015-10-26 MED ORDER — SODIUM CHLORIDE 0.9 % IV SOLN
300.0000 mg/m2 | Freq: Once | INTRAVENOUS | Status: AC
  Administered 2015-10-26: 620 mg via INTRAVENOUS
  Filled 2015-10-26: qty 31

## 2015-10-26 MED ORDER — ACYCLOVIR 400 MG PO TABS: 400.0000 mg | ORAL_TABLET | Freq: Two times a day (BID) | ORAL | Status: DC

## 2015-10-26 MED ORDER — SODIUM CHLORIDE 0.9 % IJ SOLN
10.0000 mL | INTRAMUSCULAR | Status: DC | PRN
  Administered 2015-10-26: 10 mL via INTRAVENOUS
  Filled 2015-10-26: qty 10

## 2015-10-26 NOTE — Progress Notes (Signed)
ID: Timothy [name]   DOB: 07/03/1928  MR#: [id number]  OXB#:[id number]  PCP:  SU: OTHER MD: Dorna Leitz, Alysia Penna, Georgia Duff, Geryl Councilman  CHIEF COMPLAINT: multiple myeloma  CURRENT THERAPY: cyclophosphamide, darbepoetin   MYELOMA HISTORY: From the earlier summary:  Patient presented in April 2007 with symptomatic slowly progressive anemia, with normal iron parameters, folic acid, and vitamin B12. Plans were made to obtain bone marrow aspirate and biopsy as an outpatient, however the patient was hospitalized shortly thereafter with a creatinine of 8.6. Renal biopsy demonstrated the presence of  light chain deposition disease. Bone marrow biopsy demonstrated 37% plasma cells. A skeletal survey showed small calvarial lytic lesions, mild osteopenia, and cervical spondylosis. With a well-established diagnosis of light-chain multiple myeloma Dr. Melodie Bouillon subsequent treatments are as detailed below.   INTERVAL HISTORY: Timothy [name] returns today for follow up of his multiple myeloma accompanied by his wife [name]. He continues of cyclophosphamide, which he receives every 3 weeks. He also receives darbepoetin every 3 weeks. He is tolerating those treatments with no side effects that he is aware of.  REVIEW OF SYSTEMS: Timothy [name] denies fevers or night sweats. He has no nausea or vomiting, his appetite is good. He does not get constipated with narcotic use. He is using $Remove'4mg'xiiPvWt$  dilaudid daily for his right hip/femur pain first thing in the morning. He uses a walker to ambulate, but can bear weight if he needs to on the right. He continues to have bilateral lower extremity edema. She is short of breath with exertion, but denies chest pain, cough, or palpitations. A detailed review of system is otherwise stable.  PAST MEDICAL HISTORY: Past Medical History  Diagnosis Date  . ASCVD (arteriosclerotic cardiovascular disease)   . Gout   .  Hypertension   . Lumbar disc disease     post laminectomy  . History of echocardiogram 11/02/2010    EF range of 30 to 35% / There is hypokinsesis of the basal-mild inferolateral myocardium  . CHF (congestive heart failure) ([place])   . Heart murmur   . Ischemic cardiomyopathy   . Renal insufficiency   . Peripheral neuropathy ([place])   . Hyperlipidemia   . SOB (shortness of breath)   . Fatigue   . Peripheral neuropathy ([place])   . Coronary artery disease     CABG 2002 by Dr. Roxy Manns with LIMA to LAD, SVG to intermediate, SVG to LCX, & SVG to PL. Last nuclear in 2012 showing large inferior scar with EF of 39%.   . Blood transfusion 10/2011; 07/2013    "multiple myeloma; when I broke my leg"  . History of shingles MARCH 2009    SHINGLE LESIONS WERE AROUND RIGHT EYE--PT HAS RESIDUAL ITCHING AROUND THE EYE.  . Osteomyelitis ([place])     s/p toe amputation  . Chronic systolic heart failure (HCC)     EF of 30% per echo 02/2012  . Protein malnutrition ([place])   . Critical lower limb ischemia   . Peripheral vascular disease ([place])   . Hx of cardiovascular stress test     Lexiscan Myoview (1/16):  Large inferior and apical scar; no ischemia, EF 27%;  High Risk  . Inferior myocardial infarction ([place]) 1986  . Pulmonary embolism ([place])     "when I had big toe amputated"  . Chronic anemia     "due to the multiple myeloma"  . Chronic lower back pain   . 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  PT HAS BEEN OFF CHEMO SINCE HIS HOSPITALIZATION FEB 2013 FOR LEFT FOOT INFECTION  . Basal cell carcinoma     "chest left arm"   PAST SURGICAL HISTORY: Past Surgical History  Procedure Laterality Date  . Lumbar laminectomy    . Cardiac catheterization  06/20/2000    Severe coronary disease (totally occluded right artery, 90% left circumflex, 50-60% intermediate, and 90-95% ostial left anterior descending)   . Coronary artery bypass graft  2002    x4 /  Left internal mammary artery to the LAD.  / Saphenous vein graft to the right posterolateral.  /  Saphenous vein graft to  the ramus intermedius. / Saphenous vein graft to the circumflex marginal     . Amputation  03/06/2012    Procedure: AMPUTATION RAY;  Surgeon: Alta Corning, MD;  Location: [facility];  Service: Orthopedics;  Laterality: Left;  First Ray Amputation  . Intramedullary (im) nail intertrochanteric Right 08/17/2013    Procedure: INTRAMEDULLARY (IM) NAIL INTERTROCHANTRIC;  Surgeon: Marybelle Killings, MD;  Location: [facility];  Service: Orthopedics;  Laterality: Right;  . Endarterectomy femoral Left 03/24/2014    Procedure: Left Common Femoral  Artery Endarterectomy with Vein Patch Angioplasty,  Ultrasound ;  Surgeon: Angelia Mould, MD;  Location: [facility];  Service: Vascular;  Laterality: Left;  . Cataract extraction w/ intraocular lens  implant, bilateral Bilateral   . Lower extremity angiogram N/A 03/23/2014    Procedure: LOWER EXTREMITY ANGIOGRAM;  Surgeon: Lorretta Harp, MD;  Location: [facility] CATH LAB;  Service: Cardiovascular;  Laterality: N/A;  . Peripheral vascular catheterization N/A 05/27/2015    Procedure: Lower Extremity Angiography;  Surgeon: Lorretta Harp, MD;  Location: [facility] CV LAB;  Service: Cardiovascular;  Laterality: N/A;  . Peripheral vascular catheterization  06/03/2015    Procedure: Peripheral Vascular Intervention;  Surgeon: Lorretta Harp, MD;  Location: [facility];  Service: Cardiovascular;;  Lt Common Iliac  . Tonsillectomy  ~ 1936  . Inguinal hernia repair Bilateral   . Fracture surgery    . Back surgery    . Basal cell carcinoma excision      "chest left arm"  . 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 He and his wife [name] are the only ones at  home.    ADVANCED DIRECTIVES: [name] has wife is his healthcare part of attorney  HEALTH MAINTENANCE: Social History  Substance Use Topics  . Smoking status: Former Smoker -- 1.00 packs/day for 20 years    Types: Cigarettes    Quit date: 11/20/1984  . Smokeless tobacco: Never Used  . Alcohol Use: Yes     Comment: 06/03/2015 "might have a drink a few times/year"     Colonoscopy:  Bone density:  Lipid panel:  Allergies  Allergen Reactions  . Morphine And Related Other (See Comments)    Agitated with morphine drip    Current Outpatient Prescriptions  Medication Sig Dispense Refill  . allopurinol (ZYLOPRIM) 300 MG tablet TAKE ONE-HALF (1/2) TABLET DAILY 90 tablet 2  . aspirin 81 MG chewable tablet Chew 1 tablet (81 mg total) by mouth daily.    Marland Kitchen atorvastatin (LIPITOR) 10 MG tablet TAKE 1 TABLET EVERY EVENING 90 tablet 2  . B Complex-Biotin-FA (B COMPLETE PO)  Take 1 tablet by mouth daily.     . cadexomer iodine (IODOSORB) 0.9 % gel Apply 1 application topically daily.    . carvedilol (COREG) 6.25 MG tablet TAKE 1 TABLET TWICE A DAY WITH MEALS 180 tablet 2  . clopidogrel (PLAVIX) 75 MG tablet Take 1 tablet (75 mg total) by mouth daily with breakfast. 90 tablet 3  . Coenzyme Q10 (CO Q-10 PO) Take 1 tablet by mouth every morning.     . darbepoetin (ARANESP) 200 MCG/0.4ML SOLN injection Inject 200 mcg into the skin as directed.    . furosemide (LASIX) 40 MG tablet TAKE 1 TABLET DAILY 90 tablet 0  . HYDROmorphone (DILAUDID) 4 MG tablet Take 1 tablet (4 mg total) by mouth every 4 (four) hours as needed for severe pain. 60 tablet 0  . isosorbide mononitrate (IMDUR) 30 MG 24 hr tablet Take 1 tablet (30 mg total) by mouth daily. 90 tablet 3  . lisinopril (PRINIVIL,ZESTRIL) 20 MG tablet TAKE 1 TABLET DAILY 90 tablet 2  . Multiple Vitamin (MULTIVITAMIN) capsule Take 1 capsule by mouth every morning.     Marland Kitchen rOPINIRole (REQUIP) 3 MG tablet Take 1 tablet (3 mg total) by mouth at bedtime. 90  tablet 11  . sodium chloride 0.9 % SOLN 250 mL with cyclophosphamide 1 G SOLR 300 mg/m2 Inject 300 mg/m2 into the vein every 14 (fourteen) days.    . tamsulosin (FLOMAX) 0.4 MG CAPS capsule Take 0.4 mg by mouth at bedtime.    . vitamin C (ASCORBIC ACID) 500 MG tablet Take 250 mg by mouth 2 (two) times daily.     . Zoledronic Acid (ZOMETA IV) Inject 1 each into the vein once.    Marland Kitchen acyclovir (ZOVIRAX) 400 MG tablet Take 1 tablet (400 mg total) by mouth 2 (two) times daily. 180 tablet 2  . diphenoxylate-atropine (LOMOTIL) 2.5-0.025 MG tablet Take 1 tablet by mouth 4 (four) times daily as needed for diarrhea or loose stools. (Patient not taking: Reported on 10/26/2015) 30 tablet 0  . Heparin Lock Flush (HEPARIN FLUSH, PORCINE,) 100 UNIT/ML injection 2.58ml ( 250 ) per picc line flush. Use 1 syringe per port. (Patient not taking: Reported on 10/26/2015) 30 Syringe 3  . nitroGLYCERIN (NITROSTAT) 0.4 MG SL tablet Place 1 tablet (0.4 mg total) under the tongue every 5 (five) minutes as needed for chest pain. (Patient not taking: Reported on 10/26/2015) 25 tablet 3  . ondansetron (ZOFRAN) 8 MG tablet Take 1 tablet (8 mg total) by mouth 2 (two) times daily as needed (Nausea or vomiting). (Patient not taking: Reported on 10/26/2015) 30 tablet 1  . polyethylene glycol (MIRALAX / GLYCOLAX) packet Take 17 g by mouth daily as needed. (Patient not taking: Reported on 10/26/2015) 14 each 0  . Sodium Chloride Flush 0.9 % SOLN injection Inject 10 mLs into the vein as directed. (Patient not taking: Reported on 10/26/2015) 30 Syringe 3   No current facility-administered medications for this visit.   Facility-Administered Medications Ordered in Other Visits  Medication Dose Route Frequency Provider Last Rate Last Dose  . sodium chloride 0.9 % injection 3 mL  3 mL Intravenous PRN [name], MD        Objective: Older white man who appears stated age 66 Vitals:   10/26/15 1131  BP: 123/45  Pulse: 86  Temp: 97.5  F (36.4 C)  Resp: 18        Body mass index is 26.52 kg/(m^2).    ECOG FS: 2  Filed Weights   10/26/15 1131  Weight: 184 lb 12.8 oz (83.825 kg)   Skin: warm, dry  HEENT: sclerae anicteric, conjunctivae pink, oropharynx clear. No thrush or mucositis.  Lymph Nodes: No cervical or supraclavicular lymphadenopathy  Lungs: clear to auscultation bilaterally, no rales, wheezes, or rhonci  Heart: regular rate and rhythm  Abdomen: round, soft, non tender, positive bowel sounds  Musculoskeletal: No focal spinal tenderness, bilateral lower extremity edema grade 1  Neuro: non focal, well oriented, positive affect    LAB RESULTS:  Results for DELONTA, YOHANNES (MRN [id number]) as of 10/05/2015 11:25  Ref. Range 06/22/2015 11:48 07/06/2015 12:07 08/24/2015 10:24 09/14/2015 09:47 09/28/2015 10:01  Kappa free light chain Latest Ref Range: 0.33-1.94 mg/dL  70.50 (H) 48.70 (H)  54.40 (H)  Lambda Free Lght Chn Latest Ref Range: 0.57-2.63 mg/dL  2.33 1.89  2.23  Kappa:Lambda Ratio Latest Ref Range: 0.26-1.65   30.26 (H) 25.77 (H)  24.39 (H)    Lab Results  Component Value Date   WBC 6.8 10/26/2015   NEUTROABS 3.5 10/26/2015   HGB 9.8* 10/26/2015   HCT 31.8* 10/26/2015   MCV 87.6 10/26/2015   PLT 236 10/26/2015      Chemistry      Component Value Date/Time   NA 139 10/26/2015 1018   NA 139 06/04/2015 0511   K 4.4 10/26/2015 1018   K 4.4 06/04/2015 0511   CL 110 06/04/2015 0511   CL 98 05/12/2013 1048   CO2 23 10/26/2015 1018   CO2 22 06/04/2015 0511   BUN 28.1* 10/26/2015 1018   BUN 31* 06/04/2015 0511   CREATININE 1.6* 10/26/2015 1018   CREATININE 1.25* 06/04/2015 0511      Component Value Date/Time   CALCIUM 9.5 10/26/2015 1018   CALCIUM 8.3* 06/04/2015 0511   ALKPHOS 67 10/26/2015 1018   ALKPHOS 42 03/25/2014 2115   AST 29 10/26/2015 1018   AST 25 03/25/2014 2115   ALT 20 10/26/2015 1018   ALT 10 03/25/2014 2115   BILITOT 0.52 10/26/2015 1018   BILITOT 0.7 03/25/2014 2115       STUDIES: Dg Hip Unilat With Pelvis 2-3 Views Right  10/12/2015  CLINICAL DATA:  Multiple myeloma in relapse. Worsening right hip pain. Prior right femur surgery EXAM: DG HIP (WITH OR WITHOUT PELVIS) 2-3V RIGHT COMPARISON:  None. FINDINGS: Hardware in the visualized proximal right femur. No hardware complicating feature. No acute bony abnormality or visible focal bone lesion. No fracture, subluxation or dislocation. Moderate degenerative changes in the hips bilaterally. IMPRESSION: Postoperative changes in the right femur.  No acute findings. Moderate bilateral hip degenerative changes. Electronically Signed   By: Rolm Baptise M.D.   On: 10/12/2015 14:23     ASSESSMENT: 81 y.o.  with kappa light chain multiple myeloma   (1) presenting April 2007 with anemia and renal failure; renal Bx showing free kappa light chain deposition; bone marrow biopsy showing 37% plasma cells, with normal cytogenetics and FISH;bone survey showing skull lytic lesions; treated with   (2) thalidomide (200 mg/d) and dexamethasone (40 mg/d x4d Q28d) June through Nov 2007, with good response, but poor tolerance;   (3) thalidomide decreased to 100 mg/d, dexamethasone continued, bortezomib (IV) added, Dec 2007 to March 2008   (4) treatment interrupted by multiple complications (peripheral neuropathy, pulmonary embolism, diverticular abscess, CN V zoster, severe DDD, congestive heart failure, rising PSA); maintenance zolendronic acid through March 2012   (5) progression April 2012, treated initially with dexamethasone alone, poorly tolerated;   (  6) bortezomib (sq) resumed July 2012; cyclophosphamide and dexamethasone added Sept 2012; zolendronic acid changed to Q 49months; all treatments held as of March 2013 due to the development of the Left foot ulcer described above  (7) anemia, on aranesp December 2012 to August 2013; resumed Q14d starting May 2015, with slowly rising reticulocyte count  (8) status post Right  trochanteric nail with proximal and distal interlock DePuy 11 mm one-pin lag screw, 44 mm distal interlock 08/09/2013 for a right comminuted intertrochanteric fracture  (9) status post removal of the first 2 toes left foot  (10) Left common femoral artery endarterectomy with vein patch angioplasty using left greater saphenous vein 03/24/2014  (11) status post fall with injury to the right hip, large hematoma, requiring rehabilitation at [place] until 07/30/2014  (12) on 10/09/2014 starting cyclophosphamide at 300 mg/m every 14 days and dexamethasone 20 mg daily 2 days each week  (13) SIADH: On fluid restriction, reduced to 1.5L on 12/04/14 because of drop in sodium  (14) chronic systolic congestive heart failure, with EF  35% per echo, with history CAD s/p CABG 2002  (15) CKD stage III  (16) peripheral vascular disease, status post outpatient stage diamondback orbital rotational atherectomy along with PTCA and placement of Viabahn covered stent 06/03/2015 for high grade calcified prox L common illiac artery stenosis noted on cath 05/27/2015  (17) poor access: Status post right-sided PICC placement 07/06/2015  (a) dressing changes at [facility] every Tuesday, wife flushes PICC every Thurs and SAT  PLAN:   Timothy [name] continues to do well as far as his myeloma is concerned. The labs were reviewed in detail and his hgb is up to 9.8 today. He will not require blood, and will continue on the aranesp as planned. He will proceed with his next cycle of cyclophosphamide as well. The light chains are still pending at this time.   Timothy [name] will continue to have his PICC dressing changed every Tuesday. His follow up visit will be on 12/20 with Dr. [name], 1 week before his next scheduled treatment. He understands and agrees with this plan. He knows the goal of treatment in his case is control. He has been encouraged to call with any issues that might arise before his next visit here.    Genelle Gather Boelter     10/26/2015

## 2015-10-26 NOTE — Progress Notes (Signed)
Only 2cc of blood return noted from both lumens.  Pt sent to lab for lab draw.  Charge nurse in infusion aware.

## 2015-10-26 NOTE — Patient Instructions (Addendum)
[facility] Discharge Instructions for Patients Receiving Chemotherapy  Today you received the following chemotherapy agents Cytoxan.  To help prevent nausea and vomiting after your treatment, we encourage you to take your nausea medication as directed.   If you develop nausea and vomiting that is not controlled by your nausea medication, call the clinic.   BELOW ARE SYMPTOMS THAT SHOULD BE REPORTED IMMEDIATELY:  *FEVER GREATER THAN 100.5 F  *CHILLS WITH OR WITHOUT FEVER  NAUSEA AND VOMITING THAT IS NOT CONTROLLED WITH YOUR NAUSEA MEDICATION  *UNUSUAL SHORTNESS OF BREATH  *UNUSUAL BRUISING OR BLEEDING  TENDERNESS IN MOUTH AND THROAT WITH OR WITHOUT PRESENCE OF ULCERS  *URINARY PROBLEMS  *BOWEL PROBLEMS  UNUSUAL RASH Items with * indicate a potential emergency and should be followed up as soon as possible.  Feel free to call the clinic you have any questions or concerns. The clinic phone number is [phone].  Please show the [facility] at check-in to the Emergency Department and triage nurse.   Darbepoetin Alfa injection What is this medicine? DARBEPOETIN ALFA (dar be POE e tin AL fa) helps your body make more red blood cells. It is used to treat anemia caused by chronic kidney failure and chemotherapy. This medicine may be used for other purposes; ask your health care provider or pharmacist if you have questions. What should I tell my health care provider before I take this medicine? They need to know if you have any of these conditions: -blood clotting disorders or history of blood clots -cancer patient not on chemotherapy -cystic fibrosis -heart disease, such as angina, heart failure, or a history of a heart attack -hemoglobin level of 12 g/dL or greater -high blood pressure -low levels of folate, iron, or vitamin B12 -seizures -an unusual or allergic reaction to darbepoetin, erythropoietin, albumin, hamster proteins, latex, other medicines,  foods, dyes, or preservatives -pregnant or trying to get pregnant -breast-feeding How should I use this medicine? This medicine is for injection into a vein or under the skin. It is usually given by a health care professional in a hospital or clinic setting. If you get this medicine at home, you will be taught how to prepare and give this medicine. Do not shake the solution before you withdraw a dose. Use exactly as directed. Take your medicine at regular intervals. Do not take your medicine more often than directed. It is important that you put your used needles and syringes in a special sharps container. Do not put them in a trash can. If you do not have a sharps container, call your pharmacist or healthcare provider to get one. Talk to your pediatrician regarding the use of this medicine in children. While this medicine may be used in children as young as 1 year for selected conditions, precautions do apply. Overdosage: If you think you have taken too much of this medicine contact a poison control center or emergency room at once. NOTE: This medicine is only for you. Do not share this medicine with others. What if I miss a dose? If you miss a dose, take it as soon as you can. If it is almost time for your next dose, take only that dose. Do not take double or extra doses. What may interact with this medicine? Do not take this medicine with any of the following medications: -epoetin alfa This list may not describe all possible interactions. Give your health care provider a list of all the medicines, herbs, non-prescription drugs, or dietary  supplements you use. Also tell them if you smoke, drink alcohol, or use illegal drugs. Some items may interact with your medicine. What should I watch for while using this medicine? Visit your prescriber or health care professional for regular checks on your progress and for the needed blood tests and blood pressure measurements. It is especially important for  the doctor to make sure your hemoglobin level is in the desired range, to limit the risk of potential side effects and to give you the best benefit. Keep all appointments for any recommended tests. Check your blood pressure as directed. Ask your doctor what your blood pressure should be and when you should contact him or her. As your body makes more red blood cells, you may need to take iron, folic acid, or vitamin B supplements. Ask your doctor or health care provider which products are right for you. If you have kidney disease continue dietary restrictions, even though this medication can make you feel better. Talk with your doctor or health care professional about the foods you eat and the vitamins that you take. What side effects may I notice from receiving this medicine? Side effects that you should report to your doctor or health care professional as soon as possible: -allergic reactions like skin rash, itching or hives, swelling of the face, lips, or tongue -breathing problems -changes in vision -chest pain -confusion, trouble speaking or understanding -feeling faint or lightheaded, falls -high blood pressure -muscle aches or pains -pain, swelling, warmth in the leg -rapid weight gain -severe headaches -sudden numbness or weakness of the face, arm or leg -trouble walking, dizziness, loss of balance or coordination -seizures (convulsions) -swelling of the ankles, feet, hands -unusually weak or tired Side effects that usually do not require medical attention (report to your doctor or health care professional if they continue or are bothersome): -diarrhea -fever, chills (flu-like symptoms) -headaches -nausea, vomiting -redness, stinging, or swelling at site where injected This list may not describe all possible side effects. Call your doctor for medical advice about side effects. You may report side effects to FDA at 1-800-FDA-1088. Where should I keep my medicine? Keep out of the  reach of children. Store in a refrigerator between 2 and 8 degrees C (36 and 46 degrees F). Do not freeze. Do not shake. Throw away any unused portion if using a single-dose vial. Throw away any unused medicine after the expiration date. NOTE: This sheet is a summary. It may not cover all possible information. If you have questions about this medicine, talk to your doctor, pharmacist, or health care provider.    2016, [employer]. (2008-10-20 10:23:57)

## 2015-10-26 NOTE — Telephone Encounter (Signed)
Appointments made and new avs printed for patient

## 2015-10-26 NOTE — Telephone Encounter (Signed)
Called patient and she is aware of the new times on 12/27

## 2015-10-26 NOTE — Progress Notes (Signed)
Okay to treat today with creat 1.6 per Dr. [name].

## 2015-10-27 LAB — KAPPA/LAMBDA LIGHT CHAINS
KAPPA LAMBDA RATIO: 22.92 — AB (ref 0.26–1.65)
Kappa free light chain: 67.6 mg/dL — ABNORMAL HIGH (ref 0.33–1.94)
Lambda Free Lght Chn: 2.95 mg/dL — ABNORMAL HIGH (ref 0.57–2.63)

## 2015-11-02 ENCOUNTER — Ambulatory Visit (HOSPITAL_BASED_OUTPATIENT_CLINIC_OR_DEPARTMENT_OTHER): Payer: Medicare Other

## 2015-11-02 ENCOUNTER — Other Ambulatory Visit (HOSPITAL_BASED_OUTPATIENT_CLINIC_OR_DEPARTMENT_OTHER): Payer: Medicare Other

## 2015-11-02 DIAGNOSIS — C9000 Multiple myeloma not having achieved remission: Secondary | ICD-10-CM | POA: Diagnosis present

## 2015-11-02 DIAGNOSIS — Z452 Encounter for adjustment and management of vascular access device: Secondary | ICD-10-CM

## 2015-11-02 LAB — COMPREHENSIVE METABOLIC PANEL
ALBUMIN: 3.4 g/dL — AB (ref 3.5–5.0)
ALK PHOS: 63 U/L (ref 40–150)
ALT: 24 U/L (ref 0–55)
AST: 28 U/L (ref 5–34)
Anion Gap: 9 mEq/L (ref 3–11)
BILIRUBIN TOTAL: 0.62 mg/dL (ref 0.20–1.20)
BUN: 24.8 mg/dL (ref 7.0–26.0)
CALCIUM: 9.2 mg/dL (ref 8.4–10.4)
CO2: 22 mEq/L (ref 22–29)
CREATININE: 1.4 mg/dL — AB (ref 0.7–1.3)
Chloride: 107 mEq/L (ref 98–109)
EGFR: 45 mL/min/{1.73_m2} — ABNORMAL LOW (ref >90)
Glucose: 110 mg/dl (ref 70–140)
Potassium: 4.2 mEq/L (ref 3.5–5.1)
Sodium: 137 mEq/L (ref 136–145)
TOTAL PROTEIN: 6.6 g/dL (ref 6.4–8.3)

## 2015-11-02 LAB — CBC WITH DIFFERENTIAL/PLATELET
BASO%: 1.9 % (ref 0.0–2.0)
BASOS ABS: 0.1 10*3/uL (ref 0.0–0.1)
EOS%: 6.2 % (ref 0.0–7.0)
Eosinophils Absolute: 0.4 10*3/uL (ref 0.0–0.5)
HEMATOCRIT: 30.1 % — AB (ref 38.4–49.9)
HEMOGLOBIN: 9.6 g/dL — AB (ref 13.0–17.1)
LYMPH#: 1.4 10*3/uL (ref 0.9–3.3)
LYMPH%: 21.6 % (ref 14.0–49.0)
MCH: 27 pg — ABNORMAL LOW (ref 27.2–33.4)
MCHC: 31.9 g/dL — ABNORMAL LOW (ref 32.0–36.0)
MCV: 84.7 fL (ref 79.3–98.0)
MONO#: 0.5 10*3/uL (ref 0.1–0.9)
MONO%: 7.3 % (ref 0.0–14.0)
NEUT#: 4 10*3/uL (ref 1.5–6.5)
NEUT%: 63 % (ref 39.0–75.0)
Platelets: 253 10*3/uL (ref 140–400)
RBC: 3.55 10*6/uL — ABNORMAL LOW (ref 4.20–5.82)
RDW: 21.6 % — AB (ref 11.0–14.6)
WBC: 6.4 10*3/uL (ref 4.0–10.3)

## 2015-11-02 LAB — TECHNOLOGIST REVIEW

## 2015-11-02 MED ORDER — ALTEPLASE 2 MG IJ SOLR
2.0000 mg | Freq: Once | INTRAMUSCULAR | Status: DC | PRN
  Filled 2015-11-02: qty 2

## 2015-11-02 MED ORDER — HEPARIN SOD (PORK) LOCK FLUSH 100 UNIT/ML IV SOLN
500.0000 [IU] | Freq: Once | INTRAVENOUS | Status: AC
  Administered 2015-11-02: 250 [IU] via INTRAVENOUS
  Filled 2015-11-02: qty 5

## 2015-11-02 MED ORDER — SODIUM CHLORIDE 0.9 % IJ SOLN
10.0000 mL | INTRAMUSCULAR | Status: DC | PRN
  Administered 2015-11-02: 10 mL via INTRAVENOUS
  Filled 2015-11-02: qty 10

## 2015-11-02 NOTE — Patient Instructions (Signed)
PICC Home Guide A peripherally inserted central catheter (PICC) is a long, thin, flexible tube that is inserted into a vein in the upper arm. It is a form of intravenous (IV) access. It is considered to be a "central" line because the tip of the PICC ends in a large vein in your chest. This large vein is called the superior vena cava (SVC). The PICC tip ends in the SVC because there is a lot of blood flow in the SVC. This allows medicines and IV fluids to be quickly distributed throughout the body. The PICC is inserted using a sterile technique by a specially trained nurse or physician. After the PICC is inserted, a chest X-ray exam is done to be sure it is in the correct place.  A PICC may be placed for different reasons, such as:  To give medicines and liquid nutrition that can only be given through a central line. Examples are:  Certain antibiotic treatments.  Chemotherapy.  Total parenteral nutrition (TPN).  To take frequent blood samples.  To give IV fluids and blood products.  If there is difficulty placing a peripheral intravenous (PIV) catheter. If taken care of properly, a PICC can remain in place for several months. A PICC can also allow a person to go home from the hospital early. Medicine and PICC care can be managed at home by a family member or home health care team. WHAT PROBLEMS CAN HAPPEN WHEN I HAVE A PICC? Problems with a PICC can occasionally occur. These may include the following:  A blood clot (thrombus) forming in or at the tip of the PICC. This can cause the PICC to become clogged. A clot-dissolving medicine called tissue plasminogen activator (tPA) can be given through the PICC to help break up the clot.  Inflammation of the vein (phlebitis) in which the PICC is placed. Signs of inflammation may include redness, pain at the insertion site, red streaks, or being able to feel a "cord" in the vein where the PICC is located.  Infection in the PICC or at the insertion  site. Signs of infection may include fever, chills, redness, swelling, or pus drainage from the PICC insertion site.  PICC movement (malposition). The PICC tip may move from its original position due to excessive physical activity, forceful coughing, sneezing, or vomiting.  A break or cut in the PICC. It is important to not use scissors near the PICC.  Nerve or tendon irritation or injury during PICC insertion. WHAT SHOULD I KEEP IN MIND ABOUT ACTIVITIES WHEN I HAVE A PICC?  You may bend your arm and move it freely. If your PICC is near or at the bend of your elbow, avoid activity with repeated motion at the elbow.  Rest at home for the remainder of the day following PICC line insertion.  Avoid lifting heavy objects as instructed by your health care provider.  Avoid using a crutch with the arm on the same side as your PICC. You may need to use a walker. WHAT SHOULD I KNOW ABOUT MY PICC DRESSING?  Keep your PICC bandage (dressing) clean and dry to prevent infection.  Ask your health care provider when you may shower. Ask your health care provider to teach you how to wrap the PICC when you do take a shower.  Change the PICC dressing as instructed by your health care provider.  Change your PICC dressing if it becomes loose or wet. WHAT SHOULD I KNOW ABOUT PICC CARE?  Check the PICC insertion site   daily for leakage, redness, swelling, or pain.  Do not take a bath, swim, or use hot tubs when you have a PICC. Cover PICC line with clear plastic wrap and tape to keep it dry while showering.  Flush the PICC as directed by your health care provider. Let your health care provider know right away if the PICC is difficult to flush or does not flush. Do not use force to flush the PICC.  Do not use a syringe that is less than 10 mL to flush the PICC.  Never pull or tug on the PICC.  Avoid blood pressure checks on the arm with the PICC.  Keep your PICC identification card with you at all  times.  Do not take the PICC out yourself. Only a trained clinical professional should remove the PICC. SEEK IMMEDIATE MEDICAL CARE IF:  Your PICC is accidentally pulled all the way out. If this happens, cover the insertion site with a bandage or gauze dressing. Do not throw the PICC away. Your health care provider will need to inspect it.  Your PICC was tugged or pulled and has partially come out. Do not  push the PICC back in.  There is any type of drainage, redness, or swelling where the PICC enters the skin.  You cannot flush the PICC, it is difficult to flush, or the PICC leaks around the insertion site when it is flushed.  You hear a "flushing" sound when the PICC is flushed.  You have pain, discomfort, or numbness in your arm, shoulder, or jaw on the same side as the PICC.  You feel your heart "racing" or skipping beats.  You notice a hole or tear in the PICC.  You develop chills or a fever. MAKE SURE YOU:   Understand these instructions.  Will watch your condition.  Will get help right away if you are not doing well or get worse.   This information is not intended to replace advice given to you by your health care provider. Make sure you discuss any questions you have with your health care provider.   Document Released: 05/13/2003 Document Revised: 11/27/2014 Document Reviewed: 07/14/2013 Elsevier Interactive Patient Education 2016 [employer]  

## 2015-11-02 NOTE — Progress Notes (Signed)
Pt in for picc flush/dressing change. Unable to get initial blood return, multiple manipulations performed with only approx 1 cc of blood return noted from purple lumen. No blood return from red lumen. Labs drawn peripherally from arm by phlebotomist. Informed Dr. Starleen Arms nurse, TPA ordered. Terri, RN in to administer, checked for blood return, blood noted from both lumens. Terri flushed both lumens with saline/heparin. Cathflo not given at this time.

## 2015-11-03 ENCOUNTER — Encounter: Payer: Self-pay | Admitting: Cardiology

## 2015-11-03 ENCOUNTER — Ambulatory Visit (INDEPENDENT_AMBULATORY_CARE_PROVIDER_SITE_OTHER): Payer: Medicare Other | Admitting: Cardiology

## 2015-11-03 VITALS — BP 126/78 | HR 66 | Ht 70.0 in | Wt 183.8 lb

## 2015-11-03 DIAGNOSIS — I739 Peripheral vascular disease, unspecified: Secondary | ICD-10-CM | POA: Diagnosis not present

## 2015-11-03 DIAGNOSIS — I255 Ischemic cardiomyopathy: Secondary | ICD-10-CM | POA: Diagnosis not present

## 2015-11-03 DIAGNOSIS — I5022 Chronic systolic (congestive) heart failure: Secondary | ICD-10-CM | POA: Diagnosis not present

## 2015-11-03 DIAGNOSIS — Z951 Presence of aortocoronary bypass graft: Secondary | ICD-10-CM

## 2015-11-03 DIAGNOSIS — I2510 Atherosclerotic heart disease of native coronary artery without angina pectoris: Secondary | ICD-10-CM

## 2015-11-03 NOTE — Patient Instructions (Signed)
You may stop taking Plavix   Continue your other therapy  I will see you in 6 months.

## 2015-11-03 NOTE — Progress Notes (Signed)
Timothy [name] Date of Birth: May 28, 1928 Medical Record #[id number]  History of Present Illness: Timothy [name] is seen  today for follow up of CHF. He has a complex medical history which includes an ischemic CM with an EF of 35%. He has multiple myeloma with progressive anemia and is followed closely by oncology. He is on Decadron and Cytoxan. Other problems include HLD, depression, remote CABG in 2002, CKD, neuropathy, anemia and gout. He also has a history of hyponatremia.  He developed critical limb ischemia and underwent left femoral endarterectomy and patch angioplasty in May 2015. This year he has noted increased edema. More recently he developed an ulcer on his left second toe. Evaluation revealed a 95% left iliac stenosis and he underwent rotational atherectomy and stenting of this by Dr. Gwenlyn Found. ABIs have improved.   He does have a history of CHF. On lasix daily. Has a difficult time taking more due to frequent urination. Weight is stable and breathing is about the same. No increase in edema or dyspnea.  Major complaint today is of pain in right hip (prior surgery for fracture). Takes Dilaudid daily when he gets up. No chest pain or palpitations.  Current Outpatient Prescriptions on File Prior to Visit  Medication Sig Dispense Refill  . acyclovir (ZOVIRAX) 400 MG tablet Take 1 tablet (400 mg total) by mouth 2 (two) times daily. 180 tablet 2  . allopurinol (ZYLOPRIM) 300 MG tablet TAKE ONE-HALF (1/2) TABLET DAILY 90 tablet 2  . aspirin 81 MG chewable tablet Chew 1 tablet (81 mg total) by mouth daily.    Marland Kitchen atorvastatin (LIPITOR) 10 MG tablet TAKE 1 TABLET EVERY EVENING 90 tablet 2  . B Complex-Biotin-FA (B COMPLETE PO) Take 1 tablet by mouth daily.     . carvedilol (COREG) 6.25 MG tablet TAKE 1 TABLET TWICE A DAY WITH MEALS 180 tablet 2  . Coenzyme Q10 (CO Q-10 PO) Take 1 tablet by mouth every morning.     . darbepoetin (ARANESP) 200 MCG/0.4ML SOLN injection Inject 200 mcg into the skin as  directed.    . diphenoxylate-atropine (LOMOTIL) 2.5-0.025 MG tablet Take 1 tablet by mouth 4 (four) times daily as needed for diarrhea or loose stools. 30 tablet 0  . furosemide (LASIX) 40 MG tablet TAKE 1 TABLET DAILY 90 tablet 0  . Heparin Lock Flush (HEPARIN FLUSH, PORCINE,) 100 UNIT/ML injection 2.30m ( 250 ) per picc line flush. Use 1 syringe per port. 30 Syringe 3  . HYDROmorphone (DILAUDID) 4 MG tablet Take 1 tablet (4 mg total) by mouth every 4 (four) hours as needed for severe pain. 60 tablet 0  . isosorbide mononitrate (IMDUR) 30 MG 24 hr tablet Take 1 tablet (30 mg total) by mouth daily. 90 tablet 3  . lisinopril (PRINIVIL,ZESTRIL) 20 MG tablet TAKE 1 TABLET DAILY 90 tablet 2  . Multiple Vitamin (MULTIVITAMIN) capsule Take 1 capsule by mouth every morning.     . nitroGLYCERIN (NITROSTAT) 0.4 MG SL tablet Place 1 tablet (0.4 mg total) under the tongue every 5 (five) minutes as needed for chest pain. 25 tablet 3  . polyethylene glycol (MIRALAX / GLYCOLAX) packet Take 17 g by mouth daily as needed. 14 each 0  . rOPINIRole (REQUIP) 3 MG tablet Take 1 tablet (3 mg total) by mouth at bedtime. 90 tablet 11  . sodium chloride 0.9 % SOLN 250 mL with cyclophosphamide 1 G SOLR 300 mg/m2 Inject 300 mg/m2 into the vein every 14 (fourteen) days.    .Marland Kitchen  Sodium Chloride Flush 0.9 % SOLN injection Inject 10 mLs into the vein as directed. 30 Syringe 3  . tamsulosin (FLOMAX) 0.4 MG CAPS capsule Take 0.4 mg by mouth at bedtime.    . vitamin C (ASCORBIC ACID) 500 MG tablet Take 250 mg by mouth 2 (two) times daily.     . Zoledronic Acid (ZOMETA IV) Inject 1 each into the vein once.     Current Facility-Administered Medications on File Prior to Visit  Medication Dose Route Frequency Provider Last Rate Last Dose  . sodium chloride 0.9 % injection 3 mL  3 mL Intravenous PRN [name], MD        Allergies  Allergen Reactions  . Morphine And Related Other (See Comments)    Agitated with morphine drip     Past Medical History  Diagnosis Date  . ASCVD (arteriosclerotic cardiovascular disease)   . Gout   . Hypertension   . Lumbar disc disease     post laminectomy  . History of echocardiogram 11/02/2010    EF range of 30 to 35% / There is hypokinsesis of the basal-mild inferolateral myocardium  . CHF (congestive heart failure) ([place])   . Heart murmur   . Ischemic cardiomyopathy   . Renal insufficiency   . Peripheral neuropathy ([place])   . Hyperlipidemia   . SOB (shortness of breath)   . Fatigue   . Peripheral neuropathy ([place])   . Coronary artery disease     CABG 2002 by Dr. Roxy Manns with LIMA to LAD, SVG to intermediate, SVG to LCX, & SVG to PL. Last nuclear in 2012 showing large inferior scar with EF of 39%.   . Blood transfusion 10/2011; 07/2013    "multiple myeloma; when I broke my leg"  . History of shingles MARCH 2009    SHINGLE LESIONS WERE AROUND RIGHT EYE--PT HAS RESIDUAL ITCHING AROUND THE EYE.  . Osteomyelitis ([place])     s/p toe amputation  . Chronic systolic heart failure ([facility])     EF of 30% per echo 02/2012  . Protein malnutrition ([place])   . Critical lower limb ischemia   . Peripheral vascular disease ([place])   . Hx of cardiovascular stress test     Lexiscan Myoview (1/16):  Large inferior and apical scar; no ischemia, EF 27%;  High Risk  . Inferior myocardial infarction ([place]) 1986  . Pulmonary embolism ([place])     "when I had big toe amputated"  . Chronic anemia     "due to the multiple myeloma"  . Chronic lower back pain   . Multiple myeloma ([place]) dx'd 04/2006    with recurrence of increasing problems  DR. Iselin -ONCOLOGIST.  PT HAS BEEN OFF CHEMO SINCE HIS HOSPITALIZATION FEB 2013 FOR LEFT FOOT INFECTION  . Basal cell carcinoma     "chest left arm"    Past Surgical History  Procedure Laterality Date  . Lumbar laminectomy    . Cardiac catheterization  06/20/2000    Severe coronary disease (totally occluded right artery, 90% left circumflex, 50-60% intermediate, and  90-95% ostial left anterior descending)   . Coronary artery bypass graft  2002    x4 / Left internal mammary artery to the LAD.  / Saphenous vein graft to the right posterolateral.  /  Saphenous vein graft to  the ramus intermedius. / Saphenous vein graft to the circumflex marginal     . Amputation  03/06/2012    Procedure: AMPUTATION RAY;  Surgeon: Alta Corning, MD;  Location: [facility];  Service: Orthopedics;  Laterality: Left;  First Ray Amputation  . Intramedullary (im) nail intertrochanteric Right 08/17/2013    Procedure: INTRAMEDULLARY (IM) NAIL INTERTROCHANTRIC;  Surgeon: Marybelle Killings, MD;  Location: [facility];  Service: Orthopedics;  Laterality: Right;  . Endarterectomy femoral Left 03/24/2014    Procedure: Left Common Femoral  Artery Endarterectomy with Vein Patch Angioplasty,  Ultrasound ;  Surgeon: Angelia Mould, MD;  Location: [facility];  Service: Vascular;  Laterality: Left;  . Cataract extraction w/ intraocular lens  implant, bilateral Bilateral   . Lower extremity angiogram N/A 03/23/2014    Procedure: LOWER EXTREMITY ANGIOGRAM;  Surgeon: Lorretta Harp, MD;  Location: [facility];  Service: Cardiovascular;  Laterality: N/A;  . Peripheral vascular catheterization N/A 05/27/2015    Procedure: Lower Extremity Angiography;  Surgeon: Lorretta Harp, MD;  Location: [facility];  Service: Cardiovascular;  Laterality: N/A;  . Peripheral vascular catheterization  06/03/2015    Procedure: Peripheral Vascular Intervention;  Surgeon: Lorretta Harp, MD;  Location: [facility];  Service: Cardiovascular;;  Lt Common Iliac  . Tonsillectomy  ~ 1936  . Inguinal hernia repair Bilateral   . Fracture surgery    . Back surgery    . Basal cell carcinoma excision      "chest left arm"  . Toe amputation Left ~ 2014    History  Smoking status  . Former Smoker -- 1.00 packs/day for 20 years  . Types: Cigarettes  . Quit date: 11/20/1984  Smokeless tobacco  . Never Used    History   Alcohol Use  . Yes    Comment: 06/03/2015 "might have a drink a few times/year"    Family History  Problem Relation Age of Onset  . Heart attack Mother   . Heart disease Father     Review of Systems: The review of systems is per the HPI.  All other systems were reviewed and are negative.  Physical Exam: BP 126/78 mmHg  Pulse 66  Ht _0  (1.778 m)  Wt 83.371 kg (183 lb 12.8 oz)  BMI 26.37 kg/m2 Patient is very pleasant, obese and in no acute distress. Skin is warm and dry. Color is normal.  HEENT is unremarkable. Normocephalic/atraumatic. PERRL. Sclera are nonicteric. Neck is supple. No masses. No JVD. Lungs  are clear. Cardiac exam shows a regular rate and rhythm. Normal S1-2. No gallop or murmur. Abdomen is soft. Extremities reveal 1+ edema. Gait and ROM are intact. No gross neurologic deficits noted.  LABORATORY DATA:     Lab Results  Component Value Date   WBC 6.4 11/02/2015   HGB 9.6* 11/02/2015   HCT 30.1* 11/02/2015   PLT 253 11/02/2015   GLUCOSE 110 11/02/2015   CHOL 102 08/13/2013   TRIG 75 08/13/2013   HDL 37* 08/13/2013   LDLCALC 50 08/13/2013   ALT 24 11/02/2015   AST 28 11/02/2015   NA 137 11/02/2015   K 4.2 11/02/2015   CL 110 06/04/2015   CREATININE 1.4* 11/02/2015   BUN 24.8 11/02/2015   CO2 22 11/02/2015   TSH 5.772* 05/21/2015   PSA 3.75 05/30/2006   INR 1.08 06/03/2015      Cardiology Nuclear Med Study  Timothy [name] is a 81 y.o. male MRN : [id number] DOB: [birth year]  Procedure Date: 11/24/2014  Nuclear Med Background Indication for Stress Test: Evaluation for Ischemia and Graft Patency History: CAD-CABG, 12/18/12 MPI: EF: 41% Scar minimal peri-infarct ischemia  Cardiac Risk Factors: Hypertension  Symptoms: SOB   Nuclear Pre-Procedure Caffeine/Decaff Intake: None> 12 hrs NPO After: 7:30am   Lungs: clear O2 Sat: 97% on room air. IV 0.9% NS with Angio Cath: 22g  IV Site: R Hand x 1, tolerated well IV Started by:  Irven Baltimore, RN  Chest Size (in): 46 Cup Size: n/a  Height: _0  (1.778 m)  Weight: 193 lb (87.544 kg)  BMI: Body mass index is 27.69 kg/(m^2). Tech Comments: Patient took Carvedilol this am. Irven Baltimore, RN.    Nuclear Med Study 1 or 2 day study: 1 day  Stress Test Type: Carlton Adam  Reading MD: N/A  Order Authorizing Provider: Haruka Kowaleski [place], MD  Resting Radionuclide: Technetium 70mSestamibi  Resting Radionuclide Dose: 11.0 mCi   Stress Radionuclide: Technetium 965mestamibi  Stress Radionuclide Dose: 33.0 mCi      Stress Protocol Rest HR: 56 Stress HR: 76  Rest BP: 136/54 Stress BP: 127/65  Exercise Time (min): n/a METS: n/a   Predicted Max HR: 136 bpm % Max HR: 55.88 bpm Rate Pressure Product: 10336   Dose of Adenosine (mg): n/a Dose of Lexiscan: 0.4 mg  Dose of Atropine (mg): n/a Dose of Dobutamine: n/a mcg/kg/min (at max HR)  Stress Test Technologist: SaPerrin MalteseEMT-P  Nuclear Technologist: ElEarl ManyCNMT     Rest Procedure: Myocardial perfusion imaging was performed at rest 45 minutes following the intravenous administration of Technetium 9949mstamibi. Rest ECG: NSR - Normal EKG  Stress Procedure: The patient received IV Lexiscan 0.4 mg over 15-seconds. Technetium 48m83mtamibi injected at 30-seconds. This patient had sob, chest fullness, and was flushed with the LexiLos Angeles Endoscopy Centerection. Quantitative spect images were obtained after a 45 minute delay. Stress ECG: No significant change from baseline ECG and There are scattered PVCs.  QPS Raw Data Images: Normal; no motion artifact; normal heart/lung ratio. Stress Images: Fixed inferior and apical defects Rest Images: Fixed inferior and apical defects Subtraction (SDS): No evidence of ischemia. Transient Ischemic Dilatation (Normal <1.22): 1.19 Lung/Heart Ratio (Normal <0.45): 0.36  Quantitative Gated Spect Images QGS EDV: 175 ml QGS ESV: 127  ml  Impression Exercise Capacity: Lexiscan with no exercise. BP Response: Normal blood pressure response. Clinical Symptoms: Mild chest pain/dyspnea. ECG Impression: No significant ECG changes with Lexiscan. Comparison with Prior Nuclear Study: EF 41% with inferior scar  Overall Impression: High risk stress nuclear study with moderate intensity, large size fixed inferior and apical scar. No reversible ischemia.  LV Ejection Fraction: 27%. LV Wall Motion: Global hypokinesis with inferior akinesis/dyskinesis   KennPixie Casino, FACCIrondaletified in Nuclear Cardiology Attending Cardiologist [facility]    Assessment / Plan: 1. Dyspnea on exertion. Multifactorial. Suspect component of  CHF but also has anemia and marked deconditioning. Treatment limited. Unable to tolerate higher lasix dose due to frequent urination.   There is a possibility of coronary ischemia since he is 14 years out from CABG but his prior  myoview did not show definite ischemia. I have recommended continuing lasix 40 mg daily.  2. Chronic systolic CHF. EF is stable by Echo at 35%. Patient has a known ischemic cardiomyopathy.  Continue lasix 40 mg daily and sodium and fluid restriction. On ACEi and Coreg.   3. Multiple myeloma - followed by oncology. On Decadron and Cytoxan.   4. HLD - on statin therapy.   5. Coronary disease status post CABG in 2002. Dyspnea could be anginal equivalent symptoms and in fact this is how he  initially presented.  Myoview study shows a large inferior and apical scar without ischemia.  Continue  medical therapy for now.  6. Hyponatremia. Improved.  7. Anemia- chronic. On Aranesp as needed per oncology.  8. CKD.   9. PAD s/p left iliac intervention. He is stable to stop Plavix now. Will continue ASA.  I will follow up in 6 months.

## 2015-11-04 ENCOUNTER — Ambulatory Visit: Payer: Medicare Other | Admitting: Oncology

## 2015-11-09 ENCOUNTER — Ambulatory Visit: Payer: Medicare Other

## 2015-11-09 ENCOUNTER — Ambulatory Visit (HOSPITAL_BASED_OUTPATIENT_CLINIC_OR_DEPARTMENT_OTHER): Payer: Medicare Other | Admitting: Oncology

## 2015-11-09 ENCOUNTER — Other Ambulatory Visit (HOSPITAL_BASED_OUTPATIENT_CLINIC_OR_DEPARTMENT_OTHER): Payer: Medicare Other

## 2015-11-09 VITALS — BP 128/48 | HR 70 | Temp 97.8°F | Resp 18 | Ht 70.0 in | Wt 184.2 lb

## 2015-11-09 DIAGNOSIS — G620 Drug-induced polyneuropathy: Secondary | ICD-10-CM

## 2015-11-09 DIAGNOSIS — C9000 Multiple myeloma not having achieved remission: Secondary | ICD-10-CM

## 2015-11-09 DIAGNOSIS — C9002 Multiple myeloma in relapse: Secondary | ICD-10-CM

## 2015-11-09 DIAGNOSIS — D630 Anemia in neoplastic disease: Secondary | ICD-10-CM | POA: Diagnosis not present

## 2015-11-09 DIAGNOSIS — N289 Disorder of kidney and ureter, unspecified: Secondary | ICD-10-CM

## 2015-11-09 DIAGNOSIS — I739 Peripheral vascular disease, unspecified: Secondary | ICD-10-CM

## 2015-11-09 LAB — CBC WITH DIFFERENTIAL/PLATELET
BASO%: 2.9 % — AB (ref 0.0–2.0)
Basophils Absolute: 0.1 10*3/uL (ref 0.0–0.1)
EOS ABS: 0.4 10*3/uL (ref 0.0–0.5)
EOS%: 9.5 % — ABNORMAL HIGH (ref 0.0–7.0)
HEMATOCRIT: 29.7 % — AB (ref 38.4–49.9)
HGB: 9.1 g/dL — ABNORMAL LOW (ref 13.0–17.1)
LYMPH#: 1.3 10*3/uL (ref 0.9–3.3)
LYMPH%: 33.9 % (ref 14.0–49.0)
MCH: 26.5 pg — ABNORMAL LOW (ref 27.2–33.4)
MCHC: 30.7 g/dL — ABNORMAL LOW (ref 32.0–36.0)
MCV: 86.3 fL (ref 79.3–98.0)
MONO#: 0.6 10*3/uL (ref 0.1–0.9)
MONO%: 14.7 % — ABNORMAL HIGH (ref 0.0–14.0)
NEUT%: 39 % (ref 39.0–75.0)
NEUTROS ABS: 1.5 10*3/uL (ref 1.5–6.5)
PLATELETS: 187 10*3/uL (ref 140–400)
RBC: 3.44 10*6/uL — ABNORMAL LOW (ref 4.20–5.82)
RDW: 20.9 % — ABNORMAL HIGH (ref 11.0–14.6)
WBC: 3.9 10*3/uL — AB (ref 4.0–10.3)

## 2015-11-09 LAB — COMPREHENSIVE METABOLIC PANEL
ALK PHOS: 64 U/L (ref 40–150)
ALT: 21 U/L (ref 0–55)
ANION GAP: 7 meq/L (ref 3–11)
AST: 27 U/L (ref 5–34)
Albumin: 3.4 g/dL — ABNORMAL LOW (ref 3.5–5.0)
BILIRUBIN TOTAL: 0.61 mg/dL (ref 0.20–1.20)
BUN: 31.1 mg/dL — ABNORMAL HIGH (ref 7.0–26.0)
CALCIUM: 9 mg/dL (ref 8.4–10.4)
CO2: 21 mEq/L — ABNORMAL LOW (ref 22–29)
CREATININE: 1.5 mg/dL — AB (ref 0.7–1.3)
Chloride: 106 mEq/L (ref 98–109)
EGFR: 42 mL/min/{1.73_m2} — AB (ref >90)
Glucose: 114 mg/dl (ref 70–140)
Potassium: 4.9 mEq/L (ref 3.5–5.1)
Sodium: 134 mEq/L — ABNORMAL LOW (ref 136–145)
TOTAL PROTEIN: 6.7 g/dL (ref 6.4–8.3)

## 2015-11-09 MED ORDER — HYDROMORPHONE HCL 4 MG PO TABS: 4.0000 mg | ORAL_TABLET | ORAL | Status: DC | PRN

## 2015-11-09 MED ORDER — SODIUM CHLORIDE FLUSH 0.9 % IV SOLN: 10.0000 mL | INTRAVENOUS | Status: DC

## 2015-11-09 NOTE — Progress Notes (Signed)
ID: Timothy [name]   DOB: 28-Nov-1927  MR#: [id number]  WUJ#:[id number]  PCP:  SU: OTHER MD: Dorna Leitz, Alysia Penna, Georgia Duff, Geryl Councilman  CHIEF COMPLAINT: multiple myeloma  CURRENT THERAPY: cyclophosphamide, darbepoetin   MYELOMA HISTORY: From the earlier summary:  Patient presented in April 2007 with symptomatic slowly progressive anemia, with normal iron parameters, folic acid, and vitamin B12. Plans were made to obtain bone marrow aspirate and biopsy as an outpatient, however the patient was hospitalized shortly thereafter with a creatinine of 8.6. Renal biopsy demonstrated the presence of  light chain deposition disease. Bone marrow biopsy demonstrated 37% plasma cells. A skeletal survey showed small calvarial lytic lesions, mild osteopenia, and cervical spondylosis. With a well-established diagnosis of light-chain multiple myeloma Dr. Melodie [name] subsequent treatments are as detailed below.   INTERVAL HISTORY: Timothy [name] returns today for follow up of his multiple myeloma accompanied by his wife Timothy [name]. We are treating him with intravenous cyclophosphamide every 3 weeks. He also receives darbepoetin every 3 weeks. He is tolerating those treatments with no side effects that he is aware of. The result is been a slow but steady decline in his kappa/lambda myeloma ratio. His hemoglobin however remains borderline  REVIEW OF SYSTEMS: Timothy [name]'s worse problem continues to be the peripheral neuropathy. This is causing him severe pain but in addition he has pain in his back and right hip. He takes that loaded in the morning sometimes 1-1/2 tablets and rarely later in the day. He "fights the constipation" and manages to have a bowel movement most days, usually not very hard. He tells me though that he is becoming less active. He is looking for to the family visiting over the holidays. A detailed review of systems today was  otherwise stable.  PAST MEDICAL HISTORY: Past Medical History  Diagnosis Date  . ASCVD (arteriosclerotic cardiovascular disease)   . Gout   . Hypertension   . Lumbar disc disease     post laminectomy  . History of echocardiogram 11/02/2010    EF range of 30 to 35% / There is hypokinsesis of the basal-mild inferolateral myocardium  . CHF (congestive heart failure) ([place])   . Heart murmur   . Ischemic cardiomyopathy   . Renal insufficiency   . Peripheral neuropathy ([place])   . Hyperlipidemia   . SOB (shortness of breath)   . Fatigue   . Peripheral neuropathy ([place])   . Coronary artery disease     CABG 2002 by Dr. Roxy Manns with LIMA to LAD, SVG to intermediate, SVG to LCX, & SVG to PL. Last nuclear in 2012 showing large inferior scar with EF of 39%.   . Blood transfusion 10/2011; 07/2013    "multiple myeloma; when I broke my leg"  . History of shingles MARCH 2009    SHINGLE LESIONS WERE AROUND RIGHT EYE--PT HAS RESIDUAL ITCHING AROUND THE EYE.  . Osteomyelitis ([place])     s/p toe amputation  . Chronic systolic heart failure (HCC)     EF of 30% per echo 02/2012  . Protein malnutrition ([place])   . Critical lower limb ischemia   . Peripheral vascular disease ([place])   . Hx of cardiovascular stress test     Lexiscan Myoview (1/16):  Large inferior and apical scar; no ischemia, EF 27%;  High Risk  . Inferior myocardial infarction ([place]) 1986  . Pulmonary embolism ([place])     "when I had big toe amputated"  . Chronic anemia     "  due to the multiple myeloma"  . Chronic lower back pain   . Multiple myeloma ([place]) dx'd 04/2006    with recurrence of increasing problems  DR. [place] -ONCOLOGIST.  PT HAS BEEN OFF CHEMO SINCE HIS HOSPITALIZATION FEB 2013 FOR LEFT FOOT INFECTION  . Basal cell carcinoma     "chest left arm"   PAST SURGICAL HISTORY: Past Surgical History  Procedure Laterality Date  . Lumbar laminectomy    . Cardiac catheterization  06/20/2000    Severe coronary disease (totally occluded  right artery, 90% left circumflex, 50-60% intermediate, and 90-95% ostial left anterior descending)   . Coronary artery bypass graft  2002    x4 / Left internal mammary artery to the LAD.  / Saphenous vein graft to the right posterolateral.  /  Saphenous vein graft to  the ramus intermedius. / Saphenous vein graft to the circumflex marginal     . Amputation  03/06/2012    Procedure: AMPUTATION RAY;  Surgeon: Alta Corning, MD;  Location: [facility];  Service: Orthopedics;  Laterality: Left;  First Ray Amputation  . Intramedullary (im) nail intertrochanteric Right 08/17/2013    Procedure: INTRAMEDULLARY (IM) NAIL INTERTROCHANTRIC;  Surgeon: Marybelle Killings, MD;  Location: [facility];  Service: Orthopedics;  Laterality: Right;  . Endarterectomy femoral Left 03/24/2014    Procedure: Left Common Femoral  Artery Endarterectomy with Vein Patch Angioplasty,  Ultrasound ;  Surgeon: Angelia Mould, MD;  Location: [facility];  Service: Vascular;  Laterality: Left;  . Cataract extraction w/ intraocular lens  implant, bilateral Bilateral   . Lower extremity angiogram N/A 03/23/2014    Procedure: LOWER EXTREMITY ANGIOGRAM;  Surgeon: Lorretta Harp, MD;  Location: [facility] CATH LAB;  Service: Cardiovascular;  Laterality: N/A;  . Peripheral vascular catheterization N/A 05/27/2015    Procedure: Lower Extremity Angiography;  Surgeon: Lorretta Harp, MD;  Location: [facility] CV LAB;  Service: Cardiovascular;  Laterality: N/A;  . Peripheral vascular catheterization  06/03/2015    Procedure: Peripheral Vascular Intervention;  Surgeon: Lorretta Harp, MD;  Location: [facility] CV LAB;  Service: Cardiovascular;;  Lt Common Iliac  . Tonsillectomy  ~ 1936  . Inguinal hernia repair Bilateral   . Fracture surgery    . Back surgery    . Basal cell carcinoma excision      "chest left arm"  . 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 He and his wife Timothy [name] are the only ones at home.    ADVANCED DIRECTIVES: Timothy [name] his wife is his healthcare part of attorney  HEALTH MAINTENANCE: Social History  Substance Use Topics  . Smoking status: Former Smoker -- 1.00 packs/day for 20 years    Types: Cigarettes    Quit date: 11/20/1984  . Smokeless tobacco: Never Used  . Alcohol Use: Yes     Comment: 06/03/2015 "might have a drink a few times/year"     Colonoscopy:  Bone density:  Lipid panel:  Allergies  Allergen Reactions  . Morphine And Related Other (See Comments)    Agitated with morphine drip    Current Outpatient Prescriptions  Medication Sig Dispense Refill  . acyclovir (ZOVIRAX) 400 MG tablet Take 1 tablet (400 mg total) by mouth 2 (two) times daily. 180 tablet 2  . allopurinol (ZYLOPRIM) 300 MG tablet TAKE ONE-HALF (1/2) TABLET  DAILY 90 tablet 2  . aspirin 81 MG chewable tablet Chew 1 tablet (81 mg total) by mouth daily.    Marland Kitchen atorvastatin (LIPITOR) 10 MG tablet TAKE 1 TABLET EVERY EVENING 90 tablet 2  . B Complex-Biotin-FA (B COMPLETE PO) Take 1 tablet by mouth daily.     . carvedilol (COREG) 6.25 MG tablet TAKE 1 TABLET TWICE A DAY WITH MEALS 180 tablet 2  . Coenzyme Q10 (CO Q-10 PO) Take 1 tablet by mouth every morning.     . darbepoetin (ARANESP) 200 MCG/0.4ML SOLN injection Inject 200 mcg into the skin as directed.    . diphenoxylate-atropine (LOMOTIL) 2.5-0.025 MG tablet Take 1 tablet by mouth 4 (four) times daily as needed for diarrhea or loose stools. 30 tablet 0  . furosemide (LASIX) 40 MG tablet TAKE 1 TABLET DAILY 90 tablet 0  . Heparin Lock Flush (HEPARIN FLUSH, PORCINE,) 100 UNIT/ML injection 2.11m ( 250 ) per picc line flush. Use 1 syringe per port. 30 Syringe 3  . HYDROmorphone (DILAUDID) 4 MG tablet Take 1 tablet (4 mg total) by mouth every 4 (four) hours as needed for severe pain. 60 tablet 0  .  isosorbide dinitrate (ISORDIL) 30 MG tablet Take 30 mg by mouth daily.    . isosorbide mononitrate (IMDUR) 30 MG 24 hr tablet Take 1 tablet (30 mg total) by mouth daily. 90 tablet 3  . lisinopril (PRINIVIL,ZESTRIL) 20 MG tablet TAKE 1 TABLET DAILY 90 tablet 2  . Multiple Vitamin (MULTIVITAMIN) capsule Take 1 capsule by mouth every morning.     . nitroGLYCERIN (NITROSTAT) 0.4 MG SL tablet Place 1 tablet (0.4 mg total) under the tongue every 5 (five) minutes as needed for chest pain. 25 tablet 3  . polyethylene glycol (MIRALAX / GLYCOLAX) packet Take 17 g by mouth daily as needed. 14 each 0  . rOPINIRole (REQUIP) 3 MG tablet Take 1 tablet (3 mg total) by mouth at bedtime. 90 tablet 11  . sodium chloride 0.9 % SOLN 250 mL with cyclophosphamide 1 G SOLR 300 mg/m2 Inject 300 mg/m2 into the vein every 14 (fourteen) days.    . Sodium Chloride Flush 0.9 % SOLN injection Inject 10 mLs into the vein as directed. 60 Syringe 3  . tamsulosin (FLOMAX) 0.4 MG CAPS capsule Take 0.4 mg by mouth at bedtime.    . vitamin C (ASCORBIC ACID) 500 MG tablet Take 250 mg by mouth 2 (two) times daily.     . Zoledronic Acid (ZOMETA IV) Inject 1 each into the vein once.     No current facility-administered medications for this visit.   Facility-Administered Medications Ordered in Other Visits  Medication Dose Route Frequency Provider Last Rate Last Dose  . sodium chloride 0.9 % injection 3 mL  3 mL Intravenous PRN GChauncey Cruel MD        Objective: Older white man in no acute distress Filed Vitals:   11/09/15 1121  BP: 128/48  Pulse: 70  Temp: 97.8 F (36.6 C)  Resp: 18        Body mass index is 26.43 kg/(m^2).    ECOG FS: 2 Filed Weights   11/09/15 1121  Weight: 184 lb 3.2 oz (83.553 kg)   Sclerae unicteric, pupils round and equal Oropharynx clear and moist-- no thrush or other lesions No cervical or supraclavicular adenopathy Lungs no rales or rhonchi Heart regular rate and rhythm Abd soft,  nontender, positive bowel sounds MSK no focal spinal tenderness, no joint edema Neuro:  nonfocal, well oriented, positive affect  LAB RESULTS:  Results for WYNNE, JURY (MRN [id number]) as of 11/09/2015 11:09  Ref. Range 07/06/2015 12:07 08/24/2015 10:24 09/14/2015 09:47 09/28/2015 10:01 10/26/2015 10:18  Kappa free light chain Latest Ref Range: 0.33-1.94 mg/dL 70.50 (H) 48.70 (H)  54.40 (H) 67.60 (H)  Lambda Free Lght Chn Latest Ref Range: 0.57-2.63 mg/dL 2.33 1.89  2.23 2.95 (H)  Kappa:Lambda Ratio Latest Ref Range: 0.26-1.65  30.26 (H) 25.77 (H)  24.39 (H) 22.92 (H)    Lab Results  Component Value Date   WBC 3.9* 11/09/2015   NEUTROABS 1.5 11/09/2015   HGB 9.1* 11/09/2015   HCT 29.7* 11/09/2015   MCV 86.3 11/09/2015   PLT 187 11/09/2015      Chemistry      Component Value Date/Time   NA 134* 11/09/2015 1031   NA 139 06/04/2015 0511   K 4.9 11/09/2015 1031   K 4.4 06/04/2015 0511   CL 110 06/04/2015 0511   CL 98 05/12/2013 1048   CO2 21* 11/09/2015 1031   CO2 22 06/04/2015 0511   BUN 31.1* 11/09/2015 1031   BUN 31* 06/04/2015 0511   CREATININE 1.5* 11/09/2015 1031   CREATININE 1.25* 06/04/2015 0511      Component Value Date/Time   CALCIUM 9.0 11/09/2015 1031   CALCIUM 8.3* 06/04/2015 0511   ALKPHOS 64 11/09/2015 1031   ALKPHOS 42 03/25/2014 2115   AST 27 11/09/2015 1031   AST 25 03/25/2014 2115   ALT 21 11/09/2015 1031   ALT 10 03/25/2014 2115   BILITOT 0.61 11/09/2015 1031   BILITOT 0.7 03/25/2014 2115      STUDIES: Dg Hip Unilat With Pelvis 2-3 Views Right  10/12/2015  CLINICAL DATA:  Multiple myeloma in relapse. Worsening right hip pain. Prior right femur surgery EXAM: DG HIP (WITH OR WITHOUT PELVIS) 2-3V RIGHT COMPARISON:  None. FINDINGS: Hardware in the visualized proximal right femur. No hardware complicating feature. No acute bony abnormality or visible focal bone lesion. No fracture, subluxation or dislocation. Moderate degenerative changes in the hips  bilaterally. IMPRESSION: Postoperative changes in the right femur.  No acute findings. Moderate bilateral hip degenerative changes. Electronically Signed   By: Rolm Baptise M.D.   On: 10/12/2015 14:23     ASSESSMENT: 81 y.o.  with kappa light chain multiple myeloma   (1) presenting April 2007 with anemia and renal failure; renal Bx showing free kappa light chain deposition; bone marrow biopsy showing 37% plasma cells, with normal cytogenetics and FISH;bone survey showing skull lytic lesions; treated with   (2) thalidomide (200 mg/d) and dexamethasone (40 mg/d x4d Q28d) June through Nov 2007, with good response, but poor tolerance;   (3) thalidomide decreased to 100 mg/d, dexamethasone continued, bortezomib (IV) added, Dec 2007 to March 2008   (4) treatment interrupted by multiple complications (peripheral neuropathy, pulmonary embolism, diverticular abscess, CN V zoster, severe DDD, congestive heart failure, rising PSA); maintenance zolendronic acid through March 2012   (5) progression April 2012, treated initially with dexamethasone alone, poorly tolerated;   (6) bortezomib (sq) resumed July 2012; cyclophosphamide and dexamethasone added Sept 2012; zolendronic acid changed to Q 77month; all treatments held as of March 2013 due to the development of the Left foot ulcer described above  (7) anemia, on aranesp December 2012 to August 2013; resumed Q14d starting May 2015, with slowly rising reticulocyte count  (8) status post Right trochanteric nail with proximal and distal interlock DePuy 11 mm one-pin lag screw, 44 mm distal  interlock 08/09/2013 for a right comminuted intertrochanteric fracture  (9) status post removal of the first 2 toes left foot  (10) Left common femoral artery endarterectomy with vein patch angioplasty using left greater saphenous vein 03/24/2014  (11) status post fall with injury to the right hip, large hematoma, requiring [facility] until  07/30/2014  (12) on 10/09/2014 starting cyclophosphamide at 300 mg/m every 14 days and dexamethasone 20 mg daily 2 days each week  (13) SIADH: On fluid restriction, reduced to 1.5L on 12/04/14 because of drop in sodium  (14) chronic systolic congestive heart failure, with EF  35% per echo, with history CAD s/p CABG 2002  (15) CKD stage III  (16) peripheral vascular disease, status post outpatient stage diamondback orbital rotational atherectomy along with PTCA and placement of Viabahn covered stent 06/03/2015 for high grade calcified prox L common illiac artery stenosis noted on cath 05/27/2015  (17) poor access: Status post right-sided PICC placement 07/06/2015  (a) dressing changes at [facility] every Tuesday, wife flushes PICC every Thurs and SAT  PLAN:   Timothy [name] is remarkably stable as far as his myeloma is concerned, and he continues to respond to treatment. In fact I think he might even respond to once a month cyclophosphamide. This would be considerably easier on him. He will be treated on 11/16/2015 and then every 28 days thereafter.  Of course we will continue to follow his myeloma labs every 28 days at to make sure we have a continuing response.  On the other hand he is not doing so well on the anemia front. I think he would do better if he receive the Aranesp every 2 weeks. She will receive that on 1227 and then every 14 days thereafter.  It is unfortunate that he has the severe neuropathy and other sites of pain. He does manage this well with the Dilaudid, which he takes at most twice daily, and I refilled that for him. His PICC line also is working well and I refilled his saline syringes for him also.  He will see me again in February. He knows to call for any problems that may develop before that visit. Chelsye Suhre C    11/09/2015

## 2015-11-16 ENCOUNTER — Ambulatory Visit: Payer: Medicare Other

## 2015-11-16 ENCOUNTER — Ambulatory Visit (HOSPITAL_BASED_OUTPATIENT_CLINIC_OR_DEPARTMENT_OTHER): Payer: Medicare Other

## 2015-11-16 ENCOUNTER — Other Ambulatory Visit (HOSPITAL_BASED_OUTPATIENT_CLINIC_OR_DEPARTMENT_OTHER): Payer: Medicare Other

## 2015-11-16 VITALS — BP 131/56 | HR 68 | Temp 97.0°F | Resp 18

## 2015-11-16 DIAGNOSIS — Z5111 Encounter for antineoplastic chemotherapy: Secondary | ICD-10-CM | POA: Diagnosis present

## 2015-11-16 DIAGNOSIS — C9002 Multiple myeloma in relapse: Secondary | ICD-10-CM | POA: Diagnosis not present

## 2015-11-16 DIAGNOSIS — C9000 Multiple myeloma not having achieved remission: Secondary | ICD-10-CM

## 2015-11-16 DIAGNOSIS — D630 Anemia in neoplastic disease: Secondary | ICD-10-CM

## 2015-11-16 LAB — CBC WITH DIFFERENTIAL/PLATELET
BASO%: 1 % (ref 0.0–2.0)
Basophils Absolute: 0.1 10*3/uL (ref 0.0–0.1)
EOS%: 7.2 % — AB (ref 0.0–7.0)
Eosinophils Absolute: 0.5 10*3/uL (ref 0.0–0.5)
HCT: 28.9 % — ABNORMAL LOW (ref 38.4–49.9)
HGB: 9 g/dL — ABNORMAL LOW (ref 13.0–17.1)
LYMPH#: 1.9 10*3/uL (ref 0.9–3.3)
LYMPH%: 30.9 % (ref 14.0–49.0)
MCH: 27 pg — AB (ref 27.2–33.4)
MCHC: 31.1 g/dL — AB (ref 32.0–36.0)
MCV: 86.8 fL (ref 79.3–98.0)
MONO#: 0.6 10*3/uL (ref 0.1–0.9)
MONO%: 9.7 % (ref 0.0–14.0)
NEUT#: 3.2 10*3/uL (ref 1.5–6.5)
NEUT%: 51.2 % (ref 39.0–75.0)
Platelets: 171 10*3/uL (ref 140–400)
RBC: 3.33 10*6/uL — ABNORMAL LOW (ref 4.20–5.82)
RDW: 20.8 % — ABNORMAL HIGH (ref 11.0–14.6)
WBC: 6.3 10*3/uL (ref 4.0–10.3)
nRBC: 0 % (ref 0–0)

## 2015-11-16 LAB — COMPREHENSIVE METABOLIC PANEL
ALT: 23 U/L (ref 0–55)
AST: 31 U/L (ref 5–34)
Albumin: 3.4 g/dL — ABNORMAL LOW (ref 3.5–5.0)
Alkaline Phosphatase: 68 U/L (ref 40–150)
Anion Gap: 8 mEq/L (ref 3–11)
BUN: 25.2 mg/dL (ref 7.0–26.0)
CHLORIDE: 107 meq/L (ref 98–109)
CO2: 20 meq/L — AB (ref 22–29)
Calcium: 8.8 mg/dL (ref 8.4–10.4)
Creatinine: 1.4 mg/dL — ABNORMAL HIGH (ref 0.7–1.3)
EGFR: 45 mL/min/{1.73_m2} — ABNORMAL LOW (ref >90)
GLUCOSE: 94 mg/dL (ref 70–140)
POTASSIUM: 5 meq/L (ref 3.5–5.1)
SODIUM: 135 meq/L — AB (ref 136–145)
Total Bilirubin: 0.49 mg/dL (ref 0.20–1.20)
Total Protein: 6.8 g/dL (ref 6.4–8.3)

## 2015-11-16 MED ORDER — SODIUM CHLORIDE 0.9 % IJ SOLN
10.0000 mL | INTRAMUSCULAR | Status: DC | PRN
  Administered 2015-11-16: 10 mL via INTRAVENOUS
  Filled 2015-11-16: qty 10

## 2015-11-16 MED ORDER — SODIUM CHLORIDE 0.9 % IV SOLN
Freq: Once | INTRAVENOUS | Status: AC
  Administered 2015-11-16: 15:00:00 via INTRAVENOUS
  Filled 2015-11-16: qty 4

## 2015-11-16 MED ORDER — HEPARIN SOD (PORK) LOCK FLUSH 100 UNIT/ML IV SOLN
500.0000 [IU] | Freq: Once | INTRAVENOUS | Status: AC | PRN
  Administered 2015-11-16: 250 [IU]
  Filled 2015-11-16: qty 5

## 2015-11-16 MED ORDER — SODIUM CHLORIDE 0.9 % IJ SOLN
10.0000 mL | INTRAMUSCULAR | Status: DC | PRN
  Administered 2015-11-16: 10 mL
  Filled 2015-11-16: qty 10

## 2015-11-16 MED ORDER — HEPARIN SOD (PORK) LOCK FLUSH 100 UNIT/ML IV SOLN
500.0000 [IU] | Freq: Once | INTRAVENOUS | Status: DC
  Administered 2015-11-16: 500 [IU] via INTRAVENOUS
  Filled 2015-11-16: qty 5

## 2015-11-16 MED ORDER — DARBEPOETIN ALFA 500 MCG/ML IJ SOSY
500.0000 ug | PREFILLED_SYRINGE | Freq: Once | INTRAMUSCULAR | Status: AC
  Administered 2015-11-16: 500 ug via SUBCUTANEOUS
  Filled 2015-11-16: qty 1

## 2015-11-16 MED ORDER — SODIUM CHLORIDE 0.9 % IV SOLN
Freq: Once | INTRAVENOUS | Status: AC
  Administered 2015-11-16: 15:00:00 via INTRAVENOUS

## 2015-11-16 MED ORDER — SODIUM CHLORIDE 0.9 % IV SOLN
300.0000 mg/m2 | Freq: Once | INTRAVENOUS | Status: AC
  Administered 2015-11-16: 620 mg via INTRAVENOUS
  Filled 2015-11-16: qty 31

## 2015-11-16 NOTE — Patient Instructions (Signed)
PICC Home Guide A peripherally inserted central catheter (PICC) is a long, thin, flexible tube that is inserted into a vein in the upper arm. It is a form of intravenous (IV) access. It is considered to be a "central" line because the tip of the PICC ends in a large vein in your chest. This large vein is called the superior vena cava (SVC). The PICC tip ends in the SVC because there is a lot of blood flow in the SVC. This allows medicines and IV fluids to be quickly distributed throughout the body. The PICC is inserted using a sterile technique by a specially trained nurse or physician. After the PICC is inserted, a chest X-ray exam is done to be sure it is in the correct place.  A PICC may be placed for different reasons, such as:  To give medicines and liquid nutrition that can only be given through a central line. Examples are:  Certain antibiotic treatments.  Chemotherapy.  Total parenteral nutrition (TPN).  To take frequent blood samples.  To give IV fluids and blood products.  If there is difficulty placing a peripheral intravenous (PIV) catheter. If taken care of properly, a PICC can remain in place for several months. A PICC can also allow a person to go home from the hospital early. Medicine and PICC care can be managed at home by a family member or home health care team. WHAT PROBLEMS CAN HAPPEN WHEN I HAVE A PICC? Problems with a PICC can occasionally occur. These may include the following:  A blood clot (thrombus) forming in or at the tip of the PICC. This can cause the PICC to become clogged. A clot-dissolving medicine called tissue plasminogen activator (tPA) can be given through the PICC to help break up the clot.  Inflammation of the vein (phlebitis) in which the PICC is placed. Signs of inflammation may include redness, pain at the insertion site, red streaks, or being able to feel a "cord" in the vein where the PICC is located.  Infection in the PICC or at the insertion  site. Signs of infection may include fever, chills, redness, swelling, or pus drainage from the PICC insertion site.  PICC movement (malposition). The PICC tip may move from its original position due to excessive physical activity, forceful coughing, sneezing, or vomiting.  A break or cut in the PICC. It is important to not use scissors near the PICC.  Nerve or tendon irritation or injury during PICC insertion. WHAT SHOULD I KEEP IN MIND ABOUT ACTIVITIES WHEN I HAVE A PICC?  You may bend your arm and move it freely. If your PICC is near or at the bend of your elbow, avoid activity with repeated motion at the elbow.  Rest at home for the remainder of the day following PICC line insertion.  Avoid lifting heavy objects as instructed by your health care provider.  Avoid using a crutch with the arm on the same side as your PICC. You may need to use a walker. WHAT SHOULD I KNOW ABOUT MY PICC DRESSING?  Keep your PICC bandage (dressing) clean and dry to prevent infection.  Ask your health care provider when you may shower. Ask your health care provider to teach you how to wrap the PICC when you do take a shower.  Change the PICC dressing as instructed by your health care provider.  Change your PICC dressing if it becomes loose or wet. WHAT SHOULD I KNOW ABOUT PICC CARE?  Check the PICC insertion site   daily for leakage, redness, swelling, or pain.  Do not take a bath, swim, or use hot tubs when you have a PICC. Cover PICC line with clear plastic wrap and tape to keep it dry while showering.  Flush the PICC as directed by your health care provider. Let your health care provider know right away if the PICC is difficult to flush or does not flush. Do not use force to flush the PICC.  Do not use a syringe that is less than 10 mL to flush the PICC.  Never pull or tug on the PICC.  Avoid blood pressure checks on the arm with the PICC.  Keep your PICC identification card with you at all  times.  Do not take the PICC out yourself. Only a trained clinical professional should remove the PICC. SEEK IMMEDIATE MEDICAL CARE IF:  Your PICC is accidentally pulled all the way out. If this happens, cover the insertion site with a bandage or gauze dressing. Do not throw the PICC away. Your health care provider will need to inspect it.  Your PICC was tugged or pulled and has partially come out. Do not  push the PICC back in.  There is any type of drainage, redness, or swelling where the PICC enters the skin.  You cannot flush the PICC, it is difficult to flush, or the PICC leaks around the insertion site when it is flushed.  You hear a "flushing" sound when the PICC is flushed.  You have pain, discomfort, or numbness in your arm, shoulder, or jaw on the same side as the PICC.  You feel your heart "racing" or skipping beats.  You notice a hole or tear in the PICC.  You develop chills or a fever. MAKE SURE YOU:   Understand these instructions.  Will watch your condition.  Will get help right away if you are not doing well or get worse.   This information is not intended to replace advice given to you by your health care provider. Make sure you discuss any questions you have with your health care provider.   Document Released: 05/13/2003 Document Revised: 11/27/2014 Document Reviewed: 07/14/2013 Elsevier Interactive Patient Education 2016 [employer]  

## 2015-11-16 NOTE — Patient Instructions (Signed)
[facility] Discharge Instructions for Patients Receiving Chemotherapy  Today you received the following chemotherapy agents Cytoxan.  To help prevent nausea and vomiting after your treatment, we encourage you to take your nausea medication as directed.   If you develop nausea and vomiting that is not controlled by your nausea medication, call the clinic.   BELOW ARE SYMPTOMS THAT SHOULD BE REPORTED IMMEDIATELY:  *FEVER GREATER THAN 100.5 F  *CHILLS WITH OR WITHOUT FEVER  NAUSEA AND VOMITING THAT IS NOT CONTROLLED WITH YOUR NAUSEA MEDICATION  *UNUSUAL SHORTNESS OF BREATH  *UNUSUAL BRUISING OR BLEEDING  TENDERNESS IN MOUTH AND THROAT WITH OR WITHOUT PRESENCE OF ULCERS  *URINARY PROBLEMS  *BOWEL PROBLEMS  UNUSUAL RASH Items with * indicate a potential emergency and should be followed up as soon as possible.  Feel free to call the clinic you have any questions or concerns. The clinic phone number is [phone].  Please show the Center at check-in to the Emergency Department and triage nurse.   Darbepoetin Alfa injection What is this medicine? DARBEPOETIN ALFA (dar be POE e tin AL fa) helps your body make more red blood cells. It is used to treat anemia caused by chronic kidney failure and chemotherapy. This medicine may be used for other purposes; ask your health care provider or pharmacist if you have questions. What should I tell my health care provider before I take this medicine? They need to know if you have any of these conditions: -blood clotting disorders or history of blood clots -cancer patient not on chemotherapy -cystic fibrosis -heart disease, such as angina, heart failure, or a history of a heart attack -hemoglobin level of 12 g/dL or greater -high blood pressure -low levels of folate, iron, or vitamin B12 -seizures -an unusual or allergic reaction to darbepoetin, erythropoietin, albumin, hamster proteins, latex, other medicines,  foods, dyes, or preservatives -pregnant or trying to get pregnant -breast-feeding How should I use this medicine? This medicine is for injection into a vein or under the skin. It is usually given by a health care professional in a hospital or clinic setting. If you get this medicine at home, you will be taught how to prepare and give this medicine. Do not shake the solution before you withdraw a dose. Use exactly as directed. Take your medicine at regular intervals. Do not take your medicine more often than directed. It is important that you put your used needles and syringes in a special sharps container. Do not put them in a trash can. If you do not have a sharps container, call your pharmacist or healthcare provider to get one. Talk to your pediatrician regarding the use of this medicine in children. While this medicine may be used in children as young as 1 year for selected conditions, precautions do apply. Overdosage: If you think you have taken too much of this medicine contact a poison control center or emergency room at once. NOTE: This medicine is only for you. Do not share this medicine with others. What if I miss a dose? If you miss a dose, take it as soon as you can. If it is almost time for your next dose, take only that dose. Do not take double or extra doses. What may interact with this medicine? Do not take this medicine with any of the following medications: -epoetin alfa This list may not describe all possible interactions. Give your health care provider a list of all the medicines, herbs, non-prescription drugs, or dietary  supplements you use. Also tell them if you smoke, drink alcohol, or use illegal drugs. Some items may interact with your medicine. What should I watch for while using this medicine? Visit your prescriber or health care professional for regular checks on your progress and for the needed blood tests and blood pressure measurements. It is especially important for  the doctor to make sure your hemoglobin level is in the desired range, to limit the risk of potential side effects and to give you the best benefit. Keep all appointments for any recommended tests. Check your blood pressure as directed. Ask your doctor what your blood pressure should be and when you should contact him or her. As your body makes more red blood cells, you may need to take iron, folic acid, or vitamin B supplements. Ask your doctor or health care provider which products are right for you. If you have kidney disease continue dietary restrictions, even though this medication can make you feel better. Talk with your doctor or health care professional about the foods you eat and the vitamins that you take. What side effects may I notice from receiving this medicine? Side effects that you should report to your doctor or health care professional as soon as possible: -allergic reactions like skin rash, itching or hives, swelling of the face, lips, or tongue -breathing problems -changes in vision -chest pain -confusion, trouble speaking or understanding -feeling faint or lightheaded, falls -high blood pressure -muscle aches or pains -pain, swelling, warmth in the leg -rapid weight gain -severe headaches -sudden numbness or weakness of the face, arm or leg -trouble walking, dizziness, loss of balance or coordination -seizures (convulsions) -swelling of the ankles, feet, hands -unusually weak or tired Side effects that usually do not require medical attention (report to your doctor or health care professional if they continue or are bothersome): -diarrhea -fever, chills (flu-like symptoms) -headaches -nausea, vomiting -redness, stinging, or swelling at site where injected This list may not describe all possible side effects. Call your doctor for medical advice about side effects. You may report side effects to FDA at 1-800-FDA-1088. Where should I keep my medicine? Keep out of the  reach of children. Store in a refrigerator between 2 and 8 degrees C (36 and 46 degrees F). Do not freeze. Do not shake. Throw away any unused portion if using a single-dose vial. Throw away any unused medicine after the expiration date. NOTE: This sheet is a summary. It may not cover all possible information. If you have questions about this medicine, talk to your doctor, pharmacist, or health care provider.    2016, Elsevier/Gold Standard. (2008-10-20 10:23:57)

## 2015-11-20 ENCOUNTER — Other Ambulatory Visit: Payer: Self-pay | Admitting: Nurse Practitioner

## 2015-11-23 ENCOUNTER — Ambulatory Visit (HOSPITAL_BASED_OUTPATIENT_CLINIC_OR_DEPARTMENT_OTHER): Payer: Medicare Other

## 2015-11-23 DIAGNOSIS — C9000 Multiple myeloma not having achieved remission: Secondary | ICD-10-CM

## 2015-11-23 DIAGNOSIS — Z452 Encounter for adjustment and management of vascular access device: Secondary | ICD-10-CM | POA: Diagnosis not present

## 2015-11-23 MED ORDER — SODIUM CHLORIDE 0.9 % IJ SOLN
10.0000 mL | INTRAMUSCULAR | Status: DC | PRN
  Administered 2015-11-23: 10 mL via INTRAVENOUS
  Filled 2015-11-23: qty 10

## 2015-11-23 MED ORDER — HEPARIN SOD (PORK) LOCK FLUSH 100 UNIT/ML IV SOLN
500.0000 [IU] | Freq: Once | INTRAVENOUS | Status: AC
  Administered 2015-11-23: 500 [IU] via INTRAVENOUS
  Filled 2015-11-23: qty 5

## 2015-11-23 NOTE — Patient Instructions (Signed)
PICC Home Guide A peripherally inserted central catheter (PICC) is a long, thin, flexible tube that is inserted into a vein in the upper arm. It is a form of intravenous (IV) access. It is considered to be a "central" line because the tip of the PICC ends in a large vein in your chest. This large vein is called the superior vena cava (SVC). The PICC tip ends in the SVC because there is a lot of blood flow in the SVC. This allows medicines and IV fluids to be quickly distributed throughout the body. The PICC is inserted using a sterile technique by a specially trained nurse or physician. After the PICC is inserted, a chest X-ray exam is done to be sure it is in the correct place.  A PICC may be placed for different reasons, such as:  To give medicines and liquid nutrition that can only be given through a central line. Examples are:  Certain antibiotic treatments.  Chemotherapy.  Total parenteral nutrition (TPN).  To take frequent blood samples.  To give IV fluids and blood products.  If there is difficulty placing a peripheral intravenous (PIV) catheter. If taken care of properly, a PICC can remain in place for several months. A PICC can also allow a person to go home from the hospital early. Medicine and PICC care can be managed at home by a family member or home health care team. WHAT PROBLEMS CAN HAPPEN WHEN I HAVE A PICC? Problems with a PICC can occasionally occur. These may include the following:  A blood clot (thrombus) forming in or at the tip of the PICC. This can cause the PICC to become clogged. A clot-dissolving medicine called tissue plasminogen activator (tPA) can be given through the PICC to help break up the clot.  Inflammation of the vein (phlebitis) in which the PICC is placed. Signs of inflammation may include redness, pain at the insertion site, red streaks, or being able to feel a "cord" in the vein where the PICC is located.  Infection in the PICC or at the insertion  site. Signs of infection may include fever, chills, redness, swelling, or pus drainage from the PICC insertion site.  PICC movement (malposition). The PICC tip may move from its original position due to excessive physical activity, forceful coughing, sneezing, or vomiting.  A break or cut in the PICC. It is important to not use scissors near the PICC.  Nerve or tendon irritation or injury during PICC insertion. WHAT SHOULD I KEEP IN MIND ABOUT ACTIVITIES WHEN I HAVE A PICC?  You may bend your arm and move it freely. If your PICC is near or at the bend of your elbow, avoid activity with repeated motion at the elbow.  Rest at home for the remainder of the day following PICC line insertion.  Avoid lifting heavy objects as instructed by your health care provider.  Avoid using a crutch with the arm on the same side as your PICC. You may need to use a walker. WHAT SHOULD I KNOW ABOUT MY PICC DRESSING?  Keep your PICC bandage (dressing) clean and dry to prevent infection.  Ask your health care provider when you may shower. Ask your health care provider to teach you how to wrap the PICC when you do take a shower.  Change the PICC dressing as instructed by your health care provider.  Change your PICC dressing if it becomes loose or wet. WHAT SHOULD I KNOW ABOUT PICC CARE?  Check the PICC insertion site   daily for leakage, redness, swelling, or pain.  Do not take a bath, swim, or use hot tubs when you have a PICC. Cover PICC line with clear plastic wrap and tape to keep it dry while showering.  Flush the PICC as directed by your health care provider. Let your health care provider know right away if the PICC is difficult to flush or does not flush. Do not use force to flush the PICC.  Do not use a syringe that is less than 10 mL to flush the PICC.  Never pull or tug on the PICC.  Avoid blood pressure checks on the arm with the PICC.  Keep your PICC identification card with you at all  times.  Do not take the PICC out yourself. Only a trained clinical professional should remove the PICC. SEEK IMMEDIATE MEDICAL CARE IF:  Your PICC is accidentally pulled all the way out. If this happens, cover the insertion site with a bandage or gauze dressing. Do not throw the PICC away. Your health care provider will need to inspect it.  Your PICC was tugged or pulled and has partially come out. Do not  push the PICC back in.  There is any type of drainage, redness, or swelling where the PICC enters the skin.  You cannot flush the PICC, it is difficult to flush, or the PICC leaks around the insertion site when it is flushed.  You hear a "flushing" sound when the PICC is flushed.  You have pain, discomfort, or numbness in your arm, shoulder, or jaw on the same side as the PICC.  You feel your heart "racing" or skipping beats.  You notice a hole or tear in the PICC.  You develop chills or a fever. MAKE SURE YOU:   Understand these instructions.  Will watch your condition.  Will get help right away if you are not doing well or get worse.   This information is not intended to replace advice given to you by your health care provider. Make sure you discuss any questions you have with your health care provider.   Document Released: 05/13/2003 Document Revised: 11/27/2014 Document Reviewed: 07/14/2013 Elsevier Interactive Patient Education 2016 [employer]  

## 2015-11-29 ENCOUNTER — Telehealth: Payer: Self-pay | Admitting: Oncology

## 2015-11-29 NOTE — Telephone Encounter (Signed)
RETURNED CALL TO PT WIFE RE MOVING 1/10 APPOINTMENTS TO pm. SPOKE PATIENT RE NEW TIME FOR 1:15 PM/

## 2015-11-30 ENCOUNTER — Ambulatory Visit (HOSPITAL_BASED_OUTPATIENT_CLINIC_OR_DEPARTMENT_OTHER): Payer: Medicare Other

## 2015-11-30 ENCOUNTER — Other Ambulatory Visit (HOSPITAL_BASED_OUTPATIENT_CLINIC_OR_DEPARTMENT_OTHER): Payer: Medicare Other

## 2015-11-30 ENCOUNTER — Ambulatory Visit: Payer: Medicare Other

## 2015-11-30 VITALS — BP 137/60 | HR 74 | Temp 97.5°F

## 2015-11-30 DIAGNOSIS — C9000 Multiple myeloma not having achieved remission: Secondary | ICD-10-CM

## 2015-11-30 DIAGNOSIS — D630 Anemia in neoplastic disease: Secondary | ICD-10-CM | POA: Diagnosis present

## 2015-11-30 LAB — COMPREHENSIVE METABOLIC PANEL
ALBUMIN: 3.6 g/dL (ref 3.5–5.0)
ALK PHOS: 77 U/L (ref 40–150)
ALT: 32 U/L (ref 0–55)
ANION GAP: 7 meq/L (ref 3–11)
AST: 34 U/L (ref 5–34)
BILIRUBIN TOTAL: 0.69 mg/dL (ref 0.20–1.20)
BUN: 25.6 mg/dL (ref 7.0–26.0)
CO2: 21 mEq/L — ABNORMAL LOW (ref 22–29)
CREATININE: 1.3 mg/dL (ref 0.7–1.3)
Calcium: 9.2 mg/dL (ref 8.4–10.4)
Chloride: 106 mEq/L (ref 98–109)
EGFR: 51 mL/min/{1.73_m2} — AB (ref >90)
Glucose: 84 mg/dl (ref 70–140)
Potassium: 4.5 mEq/L (ref 3.5–5.1)
Sodium: 135 mEq/L — ABNORMAL LOW (ref 136–145)
TOTAL PROTEIN: 6.8 g/dL (ref 6.4–8.3)

## 2015-11-30 LAB — CBC WITH DIFFERENTIAL/PLATELET
BASO%: 2.4 % — ABNORMAL HIGH (ref 0.0–2.0)
BASOS ABS: 0.1 10*3/uL (ref 0.0–0.1)
EOS ABS: 0.5 10*3/uL (ref 0.0–0.5)
EOS%: 12.7 % — ABNORMAL HIGH (ref 0.0–7.0)
HEMATOCRIT: 28.6 % — AB (ref 38.4–49.9)
HEMOGLOBIN: 9 g/dL — AB (ref 13.0–17.1)
LYMPH#: 1.7 10*3/uL (ref 0.9–3.3)
LYMPH%: 45.1 % (ref 14.0–49.0)
MCH: 26.6 pg — ABNORMAL LOW (ref 27.2–33.4)
MCHC: 31.5 g/dL — ABNORMAL LOW (ref 32.0–36.0)
MCV: 84.6 fL (ref 79.3–98.0)
MONO#: 0.5 10*3/uL (ref 0.1–0.9)
MONO%: 13.5 % (ref 0.0–14.0)
NEUT#: 1 10*3/uL — ABNORMAL LOW (ref 1.5–6.5)
NEUT%: 26.3 % — ABNORMAL LOW (ref 39.0–75.0)
NRBC: 0 % (ref 0–0)
PLATELETS: 156 10*3/uL (ref 140–400)
RBC: 3.38 10*6/uL — ABNORMAL LOW (ref 4.20–5.82)
RDW: 20.3 % — AB (ref 11.0–14.6)
WBC: 3.8 10*3/uL — ABNORMAL LOW (ref 4.0–10.3)

## 2015-11-30 MED ORDER — SODIUM CHLORIDE 0.9 % IJ SOLN
10.0000 mL | INTRAMUSCULAR | Status: DC | PRN
  Filled 2015-11-30: qty 10

## 2015-11-30 MED ORDER — HEPARIN SOD (PORK) LOCK FLUSH 100 UNIT/ML IV SOLN
500.0000 [IU] | Freq: Once | INTRAVENOUS | Status: DC
  Filled 2015-11-30: qty 5

## 2015-11-30 MED ORDER — DARBEPOETIN ALFA 500 MCG/ML IJ SOSY
500.0000 ug | PREFILLED_SYRINGE | Freq: Once | INTRAMUSCULAR | Status: AC
  Administered 2015-11-30: 500 ug via SUBCUTANEOUS
  Filled 2015-11-30: qty 1

## 2015-12-01 LAB — KAPPA/LAMBDA LIGHT CHAINS
IG LAMBDA FREE LIGHT CHAIN: 27.24 mg/L — AB (ref 5.71–26.30)
Ig Kappa Free Light Chain: 359.74 mg/L — ABNORMAL HIGH (ref 3.30–19.40)
KAPPA/LAMBDA FLC RATIO: 13.21 — AB (ref 0.26–1.65)

## 2015-12-07 ENCOUNTER — Ambulatory Visit: Payer: Medicare Other

## 2015-12-07 ENCOUNTER — Ambulatory Visit (HOSPITAL_BASED_OUTPATIENT_CLINIC_OR_DEPARTMENT_OTHER): Payer: Medicare Other

## 2015-12-07 DIAGNOSIS — C9000 Multiple myeloma not having achieved remission: Secondary | ICD-10-CM | POA: Diagnosis not present

## 2015-12-07 DIAGNOSIS — Z452 Encounter for adjustment and management of vascular access device: Secondary | ICD-10-CM | POA: Diagnosis not present

## 2015-12-07 MED ORDER — HEPARIN SOD (PORK) LOCK FLUSH 100 UNIT/ML IV SOLN
500.0000 [IU] | Freq: Once | INTRAVENOUS | Status: AC
  Administered 2015-12-07: 250 [IU] via INTRAVENOUS
  Filled 2015-12-07: qty 5

## 2015-12-07 MED ORDER — SODIUM CHLORIDE 0.9 % IJ SOLN
10.0000 mL | INTRAMUSCULAR | Status: DC | PRN
  Administered 2015-12-07: 10 mL via INTRAVENOUS
  Filled 2015-12-07: qty 10

## 2015-12-07 NOTE — Patient Instructions (Signed)
PICC Home Guide A peripherally inserted central catheter (PICC) is a long, thin, flexible tube that is inserted into a vein in the upper arm. It is a form of intravenous (IV) access. It is considered to be a "central" line because the tip of the PICC ends in a large vein in your chest. This large vein is called the superior vena cava (SVC). The PICC tip ends in the SVC because there is a lot of blood flow in the SVC. This allows medicines and IV fluids to be quickly distributed throughout the body. The PICC is inserted using a sterile technique by a specially trained nurse or physician. After the PICC is inserted, a chest X-ray exam is done to be sure it is in the correct place.  A PICC may be placed for different reasons, such as:  To give medicines and liquid nutrition that can only be given through a central line. Examples are:  Certain antibiotic treatments.  Chemotherapy.  Total parenteral nutrition (TPN).  To take frequent blood samples.  To give IV fluids and blood products.  If there is difficulty placing a peripheral intravenous (PIV) catheter. If taken care of properly, a PICC can remain in place for several months. A PICC can also allow a person to go home from the hospital early. Medicine and PICC care can be managed at home by a family member or home health care team. WHAT PROBLEMS CAN HAPPEN WHEN I HAVE A PICC? Problems with a PICC can occasionally occur. These may include the following:  A blood clot (thrombus) forming in or at the tip of the PICC. This can cause the PICC to become clogged. A clot-dissolving medicine called tissue plasminogen activator (tPA) can be given through the PICC to help break up the clot.  Inflammation of the vein (phlebitis) in which the PICC is placed. Signs of inflammation may include redness, pain at the insertion site, red streaks, or being able to feel a "cord" in the vein where the PICC is located.  Infection in the PICC or at the insertion  site. Signs of infection may include fever, chills, redness, swelling, or pus drainage from the PICC insertion site.  PICC movement (malposition). The PICC tip may move from its original position due to excessive physical activity, forceful coughing, sneezing, or vomiting.  A break or cut in the PICC. It is important to not use scissors near the PICC.  Nerve or tendon irritation or injury during PICC insertion. WHAT SHOULD I KEEP IN MIND ABOUT ACTIVITIES WHEN I HAVE A PICC?  You may bend your arm and move it freely. If your PICC is near or at the bend of your elbow, avoid activity with repeated motion at the elbow.  Rest at home for the remainder of the day following PICC line insertion.  Avoid lifting heavy objects as instructed by your health care provider.  Avoid using a crutch with the arm on the same side as your PICC. You may need to use a walker. WHAT SHOULD I KNOW ABOUT MY PICC DRESSING?  Keep your PICC bandage (dressing) clean and dry to prevent infection.  Ask your health care provider when you may shower. Ask your health care provider to teach you how to wrap the PICC when you do take a shower.  Change the PICC dressing as instructed by your health care provider.  Change your PICC dressing if it becomes loose or wet. WHAT SHOULD I KNOW ABOUT PICC CARE?  Check the PICC insertion site   daily for leakage, redness, swelling, or pain.  Do not take a bath, swim, or use hot tubs when you have a PICC. Cover PICC line with clear plastic wrap and tape to keep it dry while showering.  Flush the PICC as directed by your health care provider. Let your health care provider know right away if the PICC is difficult to flush or does not flush. Do not use force to flush the PICC.  Do not use a syringe that is less than 10 mL to flush the PICC.  Never pull or tug on the PICC.  Avoid blood pressure checks on the arm with the PICC.  Keep your PICC identification card with you at all  times.  Do not take the PICC out yourself. Only a trained clinical professional should remove the PICC. SEEK IMMEDIATE MEDICAL CARE IF:  Your PICC is accidentally pulled all the way out. If this happens, cover the insertion site with a bandage or gauze dressing. Do not throw the PICC away. Your health care provider will need to inspect it.  Your PICC was tugged or pulled and has partially come out. Do not  push the PICC back in.  There is any type of drainage, redness, or swelling where the PICC enters the skin.  You cannot flush the PICC, it is difficult to flush, or the PICC leaks around the insertion site when it is flushed.  You hear a "flushing" sound when the PICC is flushed.  You have pain, discomfort, or numbness in your arm, shoulder, or jaw on the same side as the PICC.  You feel your heart "racing" or skipping beats.  You notice a hole or tear in the PICC.  You develop chills or a fever. MAKE SURE YOU:   Understand these instructions.  Will watch your condition.  Will get help right away if you are not doing well or get worse.   This information is not intended to replace advice given to you by your health care provider. Make sure you discuss any questions you have with your health care provider.   Document Released: 05/13/2003 Document Revised: 11/27/2014 Document Reviewed: 07/14/2013 Elsevier Interactive Patient Education 2016 [employer]  

## 2015-12-09 MED FILL — NORMAL SALINE FLUSH SYRINGE: 0.9 | 60 days supply | Qty: 600 | Fill #0

## 2015-12-14 ENCOUNTER — Other Ambulatory Visit (HOSPITAL_BASED_OUTPATIENT_CLINIC_OR_DEPARTMENT_OTHER): Payer: Medicare Other

## 2015-12-14 ENCOUNTER — Ambulatory Visit (HOSPITAL_BASED_OUTPATIENT_CLINIC_OR_DEPARTMENT_OTHER): Payer: Medicare Other

## 2015-12-14 ENCOUNTER — Ambulatory Visit: Payer: Medicare Other

## 2015-12-14 VITALS — BP 144/65 | HR 72 | Temp 96.9°F | Resp 16

## 2015-12-14 DIAGNOSIS — C9000 Multiple myeloma not having achieved remission: Secondary | ICD-10-CM

## 2015-12-14 DIAGNOSIS — C9002 Multiple myeloma in relapse: Secondary | ICD-10-CM

## 2015-12-14 DIAGNOSIS — D630 Anemia in neoplastic disease: Secondary | ICD-10-CM

## 2015-12-14 DIAGNOSIS — Z5111 Encounter for antineoplastic chemotherapy: Secondary | ICD-10-CM

## 2015-12-14 LAB — COMPREHENSIVE METABOLIC PANEL
ALT: 22 U/L (ref 0–55)
AST: 43 U/L — AB (ref 5–34)
Albumin: 3.4 g/dL — ABNORMAL LOW (ref 3.5–5.0)
Alkaline Phosphatase: 90 U/L (ref 40–150)
Anion Gap: 8 mEq/L (ref 3–11)
BUN: 18.6 mg/dL (ref 7.0–26.0)
CHLORIDE: 104 meq/L (ref 98–109)
CO2: 23 meq/L (ref 22–29)
CREATININE: 1.4 mg/dL — AB (ref 0.7–1.3)
Calcium: 9.1 mg/dL (ref 8.4–10.4)
EGFR: 47 mL/min/{1.73_m2} — ABNORMAL LOW (ref >90)
Glucose: 121 mg/dl (ref 70–140)
Potassium: 4 mEq/L (ref 3.5–5.1)
SODIUM: 135 meq/L — AB (ref 136–145)
Total Bilirubin: 0.75 mg/dL (ref 0.20–1.20)
Total Protein: 7.2 g/dL (ref 6.4–8.3)

## 2015-12-14 LAB — CBC WITH DIFFERENTIAL/PLATELET
BASO%: 2.4 % — AB (ref 0.0–2.0)
Basophils Absolute: 0.2 10*3/uL — ABNORMAL HIGH (ref 0.0–0.1)
EOS%: 5.2 % (ref 0.0–7.0)
Eosinophils Absolute: 0.4 10*3/uL (ref 0.0–0.5)
HCT: 31.2 % — ABNORMAL LOW (ref 38.4–49.9)
HGB: 10.1 g/dL — ABNORMAL LOW (ref 13.0–17.1)
LYMPH#: 1.6 10*3/uL (ref 0.9–3.3)
LYMPH%: 21.2 % (ref 14.0–49.0)
MCH: 26.9 pg — ABNORMAL LOW (ref 27.2–33.4)
MCHC: 32.5 g/dL (ref 32.0–36.0)
MCV: 82.8 fL (ref 79.3–98.0)
MONO#: 1.1 10*3/uL — ABNORMAL HIGH (ref 0.1–0.9)
MONO%: 14.5 % — ABNORMAL HIGH (ref 0.0–14.0)
NEUT#: 4.4 10*3/uL (ref 1.5–6.5)
NEUT%: 56.7 % (ref 39.0–75.0)
Platelets: 175 10*3/uL (ref 140–400)
RBC: 3.77 10*6/uL — AB (ref 4.20–5.82)
RDW: 22.2 % — ABNORMAL HIGH (ref 11.0–14.6)
WBC: 7.7 10*3/uL (ref 4.0–10.3)

## 2015-12-14 LAB — TECHNOLOGIST REVIEW

## 2015-12-14 MED ORDER — SODIUM CHLORIDE 0.9 % IV SOLN
Freq: Once | INTRAVENOUS | Status: AC
  Administered 2015-12-14: 12:00:00 via INTRAVENOUS

## 2015-12-14 MED ORDER — SODIUM CHLORIDE 0.9 % IJ SOLN
10.0000 mL | INTRAMUSCULAR | Status: DC | PRN
  Administered 2015-12-14: 10 mL
  Filled 2015-12-14: qty 10

## 2015-12-14 MED ORDER — HEPARIN SOD (PORK) LOCK FLUSH 100 UNIT/ML IV SOLN
250.0000 [IU] | Freq: Once | INTRAVENOUS | Status: AC | PRN
  Administered 2015-12-14: 250 [IU]
  Filled 2015-12-14: qty 5

## 2015-12-14 MED ORDER — CYCLOPHOSPHAMIDE CHEMO INJECTION 1 GM
300.0000 mg/m2 | Freq: Once | INTRAMUSCULAR | Status: AC
  Administered 2015-12-14: 620 mg via INTRAVENOUS
  Filled 2015-12-14: qty 31

## 2015-12-14 MED ORDER — ZOLEDRONIC ACID 4 MG/5ML IV CONC
3.0000 mg | Freq: Once | INTRAVENOUS | Status: AC
  Administered 2015-12-14: 3 mg via INTRAVENOUS
  Filled 2015-12-14: qty 3.75

## 2015-12-14 MED ORDER — SODIUM CHLORIDE 0.9 % IV SOLN
Freq: Once | INTRAVENOUS | Status: AC
  Administered 2015-12-14: 12:00:00 via INTRAVENOUS
  Filled 2015-12-14: qty 4

## 2015-12-14 NOTE — Patient Instructions (Signed)
[facility] Discharge Instructions for Patients Receiving Chemotherapy  Today you received the following chemotherapy agents: Cytoxan.   To help prevent nausea and vomiting after your treatment, we encourage you to take your nausea medication as needed.  If you develop nausea and vomiting that is not controlled by your nausea medication, call the clinic.   BELOW ARE SYMPTOMS THAT SHOULD BE REPORTED IMMEDIATELY:  *FEVER GREATER THAN 100.5 F  *CHILLS WITH OR WITHOUT FEVER  NAUSEA AND VOMITING THAT IS NOT CONTROLLED WITH YOUR NAUSEA MEDICATION  *UNUSUAL SHORTNESS OF BREATH  *UNUSUAL BRUISING OR BLEEDING  TENDERNESS IN MOUTH AND THROAT WITH OR WITHOUT PRESENCE OF ULCERS  *URINARY PROBLEMS  *BOWEL PROBLEMS  UNUSUAL RASH Items with * indicate a potential emergency and should be followed up as soon as possible.  Feel free to call the clinic should you have any questions or concerns. The clinic phone number is [phone].  Please show the [facility] at check-in to the Emergency Department and triage nurse.

## 2015-12-14 NOTE — Progress Notes (Signed)
Aranesp held per pharmacy. HGB 10.1.

## 2015-12-14 NOTE — Progress Notes (Signed)
Aranesp not given due to HGB >10.0

## 2015-12-15 ENCOUNTER — Encounter: Payer: Self-pay | Admitting: Oncology

## 2015-12-15 LAB — KAPPA/LAMBDA LIGHT CHAINS
Ig Kappa Free Light Chain: 353.22 mg/L — ABNORMAL HIGH (ref 3.30–19.40)
Ig Lambda Free Light Chain: 33.3 mg/L — ABNORMAL HIGH (ref 5.71–26.30)
Kappa/Lambda FluidC Ratio: 10.61 — ABNORMAL HIGH (ref 0.26–1.65)

## 2015-12-20 ENCOUNTER — Other Ambulatory Visit: Payer: Self-pay | Admitting: *Deleted

## 2015-12-20 DIAGNOSIS — C9000 Multiple myeloma not having achieved remission: Secondary | ICD-10-CM

## 2015-12-20 MED ORDER — HYDROMORPHONE HCL 4 MG PO TABS: 4.0000 mg | ORAL_TABLET | ORAL | Status: DC | PRN

## 2015-12-20 MED ORDER — HYDROMORPHONE HCL 4 MG PO TABS: 4.0000 mg | ORAL_TABLET | ORAL | Status: AC | PRN

## 2015-12-21 ENCOUNTER — Ambulatory Visit (HOSPITAL_BASED_OUTPATIENT_CLINIC_OR_DEPARTMENT_OTHER): Payer: Medicare Other

## 2015-12-21 VITALS — BP 147/68 | HR 73 | Temp 97.2°F | Resp 16

## 2015-12-21 DIAGNOSIS — Z452 Encounter for adjustment and management of vascular access device: Secondary | ICD-10-CM | POA: Diagnosis not present

## 2015-12-21 DIAGNOSIS — C9000 Multiple myeloma not having achieved remission: Secondary | ICD-10-CM

## 2015-12-21 MED ORDER — HEPARIN SOD (PORK) LOCK FLUSH 100 UNIT/ML IV SOLN
500.0000 [IU] | Freq: Once | INTRAVENOUS | Status: AC
  Administered 2015-12-21: 250 [IU] via INTRAVENOUS
  Filled 2015-12-21: qty 5

## 2015-12-21 MED ORDER — SODIUM CHLORIDE 0.9% FLUSH
10.0000 mL | INTRAVENOUS | Status: DC | PRN
  Administered 2015-12-21: 10 mL via INTRAVENOUS
  Filled 2015-12-21: qty 10

## 2015-12-21 NOTE — Patient Instructions (Signed)
PICC Home Guide A peripherally inserted central catheter (PICC) is a long, thin, flexible tube that is inserted into a vein in the upper arm. It is a form of intravenous (IV) access. It is considered to be a "central" line because the tip of the PICC ends in a large vein in your chest. This large vein is called the superior vena cava (SVC). The PICC tip ends in the SVC because there is a lot of blood flow in the SVC. This allows medicines and IV fluids to be quickly distributed throughout the body. The PICC is inserted using a sterile technique by a specially trained nurse or physician. After the PICC is inserted, a chest X-ray exam is done to be sure it is in the correct place.  A PICC may be placed for different reasons, such as:  To give medicines and liquid nutrition that can only be given through a central line. Examples are:  Certain antibiotic treatments.  Chemotherapy.  Total parenteral nutrition (TPN).  To take frequent blood samples.  To give IV fluids and blood products.  If there is difficulty placing a peripheral intravenous (PIV) catheter. If taken care of properly, a PICC can remain in place for several months. A PICC can also allow a person to go home from the hospital early. Medicine and PICC care can be managed at home by a family member or home health care team. WHAT PROBLEMS CAN HAPPEN WHEN I HAVE A PICC? Problems with a PICC can occasionally occur. These may include the following:  A blood clot (thrombus) forming in or at the tip of the PICC. This can cause the PICC to become clogged. A clot-dissolving medicine called tissue plasminogen activator (tPA) can be given through the PICC to help break up the clot.  Inflammation of the vein (phlebitis) in which the PICC is placed. Signs of inflammation may include redness, pain at the insertion site, red streaks, or being able to feel a "cord" in the vein where the PICC is located.  Infection in the PICC or at the insertion  site. Signs of infection may include fever, chills, redness, swelling, or pus drainage from the PICC insertion site.  PICC movement (malposition). The PICC tip may move from its original position due to excessive physical activity, forceful coughing, sneezing, or vomiting.  A break or cut in the PICC. It is important to not use scissors near the PICC.  Nerve or tendon irritation or injury during PICC insertion. WHAT SHOULD I KEEP IN MIND ABOUT ACTIVITIES WHEN I HAVE A PICC?  You may bend your arm and move it freely. If your PICC is near or at the bend of your elbow, avoid activity with repeated motion at the elbow.  Rest at home for the remainder of the day following PICC line insertion.  Avoid lifting heavy objects as instructed by your health care provider.  Avoid using a crutch with the arm on the same side as your PICC. You may need to use a walker. WHAT SHOULD I KNOW ABOUT MY PICC DRESSING?  Keep your PICC bandage (dressing) clean and dry to prevent infection.  Ask your health care provider when you may shower. Ask your health care provider to teach you how to wrap the PICC when you do take a shower.  Change the PICC dressing as instructed by your health care provider.  Change your PICC dressing if it becomes loose or wet. WHAT SHOULD I KNOW ABOUT PICC CARE?  Check the PICC insertion site   daily for leakage, redness, swelling, or pain.  Do not take a bath, swim, or use hot tubs when you have a PICC. Cover PICC line with clear plastic wrap and tape to keep it dry while showering.  Flush the PICC as directed by your health care provider. Let your health care provider know right away if the PICC is difficult to flush or does not flush. Do not use force to flush the PICC.  Do not use a syringe that is less than 10 mL to flush the PICC.  Never pull or tug on the PICC.  Avoid blood pressure checks on the arm with the PICC.  Keep your PICC identification card with you at all  times.  Do not take the PICC out yourself. Only a trained clinical professional should remove the PICC. SEEK IMMEDIATE MEDICAL CARE IF:  Your PICC is accidentally pulled all the way out. If this happens, cover the insertion site with a bandage or gauze dressing. Do not throw the PICC away. Your health care provider will need to inspect it.  Your PICC was tugged or pulled and has partially come out. Do not  push the PICC back in.  There is any type of drainage, redness, or swelling where the PICC enters the skin.  You cannot flush the PICC, it is difficult to flush, or the PICC leaks around the insertion site when it is flushed.  You hear a "flushing" sound when the PICC is flushed.  You have pain, discomfort, or numbness in your arm, shoulder, or jaw on the same side as the PICC.  You feel your heart "racing" or skipping beats.  You notice a hole or tear in the PICC.  You develop chills or a fever. MAKE SURE YOU:   Understand these instructions.  Will watch your condition.  Will get help right away if you are not doing well or get worse.   This information is not intended to replace advice given to you by your health care provider. Make sure you discuss any questions you have with your health care provider.   Document Released: 05/13/2003 Document Revised: 11/27/2014 Document Reviewed: 07/14/2013 Elsevier Interactive Patient Education 2016 [employer]  

## 2015-12-22 ENCOUNTER — Encounter: Payer: Self-pay | Admitting: Nurse Practitioner

## 2015-12-22 ENCOUNTER — Ambulatory Visit (INDEPENDENT_AMBULATORY_CARE_PROVIDER_SITE_OTHER): Payer: Medicare Other | Admitting: Nurse Practitioner

## 2015-12-22 VITALS — BP 110/58 | HR 76 | Ht 70.0 in | Wt 179.1 lb

## 2015-12-22 DIAGNOSIS — I5022 Chronic systolic (congestive) heart failure: Secondary | ICD-10-CM | POA: Diagnosis not present

## 2015-12-22 DIAGNOSIS — I2510 Atherosclerotic heart disease of native coronary artery without angina pectoris: Secondary | ICD-10-CM

## 2015-12-22 DIAGNOSIS — R0600 Dyspnea, unspecified: Secondary | ICD-10-CM | POA: Diagnosis not present

## 2015-12-22 DIAGNOSIS — I255 Ischemic cardiomyopathy: Secondary | ICD-10-CM

## 2015-12-22 LAB — BASIC METABOLIC PANEL
BUN: 20 mg/dL (ref 7–25)
CO2: 19 mmol/L — ABNORMAL LOW (ref 20–31)
Calcium: 8.9 mg/dL (ref 8.6–10.3)
Chloride: 106 mmol/L (ref 98–110)
Creat: 1.32 mg/dL — ABNORMAL HIGH (ref 0.70–1.11)
Glucose, Bld: 98 mg/dL (ref 65–99)
Potassium: 3.9 mmol/L (ref 3.5–5.3)
Sodium: 136 mmol/L (ref 135–146)

## 2015-12-22 LAB — BRAIN NATRIURETIC PEPTIDE: Brain Natriuretic Peptide: 212.4 pg/mL — ABNORMAL HIGH (ref 0.0–100.0)

## 2015-12-22 MED ORDER — ISOSORBIDE MONONITRATE ER 60 MG PO TB24: 60.0000 mg | ORAL_TABLET | Freq: Every day | ORAL | Status: DC

## 2015-12-22 NOTE — Progress Notes (Signed)
CARDIOLOGY OFFICE NOTE  Date:  12/22/2015    Timothy [name] Date of Birth: Sep 13, 1928 Medical Record #[id number]  PCP:  [name], MD  Cardiologist:  [place]    Chief Complaint  Patient presents with  . Shortness of Breath  . Cardiomyopathy  . Congestive Heart Failure  . Coronary Artery Disease    Work in visit - seen for Dr. [place]    History of Present Illness: Timothy [name] is a 82 y.o. male who presents today for a work in visit. Seen for Dr. [place].   He has a complex medical history which includes an ischemic CM with an EF of 35%. He has multiple myeloma with progressive anemia and is followed closely by oncology. He is on Decadron and Cytoxan. Other problems include HLD, depression, remote CABG in 2002, CKD, neuropathy, anemia and gout. He also has a history of hyponatremia. He developed critical limb ischemia and underwent left femoral endarterectomy and patch angioplasty in May 2015. In 2016, he  noted increased edema and developed an ulcer on his left second toe. Evaluation revealed a 95% left iliac stenosis and he underwent rotational atherectomy and stenting of this by Dr. Gwenlyn Found in July of 2016. ABIs have improved.   He was last seen in December. More issues with hip pain. Has had difficulty taking more lasix due to frequent urination.   Comes in today. Here with [name]. Here for more shortness of breath. Not having any chest pain until last week - more a "fullness" in his chest. Has had 2 to 3 spells that lasted about 15 seconds. No NTG use. Has not seemed to be exertional. Breathing more labored. His weight is actually down 5 pounds since December visit. No swelling. No PND or orthopnea noted. No bloating. Remains on Dilaudid for hip, back, neuropathy pain and has to have increasing doses. He wants to try and continue with medical management.   Past Medical History  Diagnosis Date  . ASCVD (arteriosclerotic cardiovascular disease)   . Gout   .  Hypertension   . Lumbar disc disease     post laminectomy  . History of echocardiogram 11/02/2010    EF range of 30 to 35% / There is hypokinsesis of the basal-mild inferolateral myocardium  . CHF (congestive heart failure) ([place])   . Heart murmur   . Ischemic cardiomyopathy   . Renal insufficiency   . Peripheral neuropathy ([place])   . Hyperlipidemia   . SOB (shortness of breath)   . Fatigue   . Peripheral neuropathy ([place])   . Coronary artery disease     CABG 2002 by Dr. Roxy Manns with LIMA to LAD, SVG to intermediate, SVG to LCX, & SVG to PL. Last nuclear in 2012 showing large inferior scar with EF of 39%.   . Blood transfusion 10/2011; 07/2013    "multiple myeloma; when I broke my leg"  . History of shingles MARCH 2009    SHINGLE LESIONS WERE AROUND RIGHT EYE--PT HAS RESIDUAL ITCHING AROUND THE EYE.  . Osteomyelitis ([place])     s/p toe amputation  . Chronic systolic heart failure ([facility])     EF of 30% per echo 02/2012  . Protein malnutrition ([place])   . Critical lower limb ischemia   . Peripheral vascular disease ([place])   . Hx of cardiovascular stress test     Lexiscan Myoview (1/16):  Large inferior and apical scar; no ischemia, EF 27%;  High Risk  . Inferior myocardial infarction ([place])  1986  . Pulmonary embolism ([place])     "when I had big toe amputated"  . Chronic anemia     "due to the multiple myeloma"  . Chronic lower back pain   . Multiple myeloma ([place]) dx'd 04/2006    with recurrence of increasing problems  DR. Doolittle -ONCOLOGIST.  PT HAS BEEN OFF CHEMO SINCE HIS HOSPITALIZATION FEB 2013 FOR LEFT FOOT INFECTION  . Basal cell carcinoma     "chest left arm"    Past Surgical History  Procedure Laterality Date  . Lumbar laminectomy    . Cardiac catheterization  06/20/2000    Severe coronary disease (totally occluded right artery, 90% left circumflex, 50-60% intermediate, and 90-95% ostial left anterior descending)   . Coronary artery bypass graft  2002    x4 / Left internal mammary  artery to the LAD.  / Saphenous vein graft to the right posterolateral.  /  Saphenous vein graft to  the ramus intermedius. / Saphenous vein graft to the circumflex marginal     . Amputation  03/06/2012    Procedure: AMPUTATION RAY;  Surgeon: Alta Corning, MD;  Location: [facility];  Service: Orthopedics;  Laterality: Left;  First Ray Amputation  . Intramedullary (im) nail intertrochanteric Right 08/17/2013    Procedure: INTRAMEDULLARY (IM) NAIL INTERTROCHANTRIC;  Surgeon: Marybelle Killings, MD;  Location: [facility];  Service: Orthopedics;  Laterality: Right;  . Endarterectomy femoral Left 03/24/2014    Procedure: Left Common Femoral  Artery Endarterectomy with Vein Patch Angioplasty,  Ultrasound ;  Surgeon: Angelia Mould, MD;  Location: [facility];  Service: Vascular;  Laterality: Left;  . Cataract extraction w/ intraocular lens  implant, bilateral Bilateral   . Lower extremity angiogram N/A 03/23/2014    Procedure: LOWER EXTREMITY ANGIOGRAM;  Surgeon: Lorretta Harp, MD;  Location: [facility];  Service: Cardiovascular;  Laterality: N/A;  . Peripheral vascular catheterization N/A 05/27/2015    Procedure: Lower Extremity Angiography;  Surgeon: Lorretta Harp, MD;  Location: [facility] CV LAB;  Service: Cardiovascular;  Laterality: N/A;  . Peripheral vascular catheterization  06/03/2015    Procedure: Peripheral Vascular Intervention;  Surgeon: Lorretta Harp, MD;  Location: [facility] CV LAB;  Service: Cardiovascular;;  Lt Common Iliac  . Tonsillectomy  ~ 1936  . Inguinal hernia repair Bilateral   . Fracture surgery    . Back surgery    . Basal cell carcinoma excision      "chest left arm"  . Toe amputation Left ~ 2014     Medications: Current Outpatient Prescriptions  Medication Sig Dispense Refill  . acyclovir (ZOVIRAX) 400 MG tablet Take 1 tablet (400 mg total) by mouth 2 (two) times daily. 180 tablet 2  . allopurinol (ZYLOPRIM) 300 MG tablet TAKE ONE-HALF (1/2) TABLET DAILY 90 tablet 2  .  aspirin 81 MG chewable tablet Chew 1 tablet (81 mg total) by mouth daily.    Marland Kitchen atorvastatin (LIPITOR) 10 MG tablet TAKE 1 TABLET EVERY EVENING 90 tablet 2  . B Complex-Biotin-FA (B COMPLETE PO) Take 1 tablet by mouth daily.     . carvedilol (COREG) 6.25 MG tablet TAKE 1 TABLET TWICE A DAY WITH MEALS 180 tablet 2  . Coenzyme Q10 (CO Q-10 PO) Take 1 tablet by mouth every morning.     . darbepoetin (ARANESP) 200 MCG/0.4ML SOLN injection Inject 200 mcg into the skin as directed.    . diphenoxylate-atropine (LOMOTIL) 2.5-0.025 MG tablet Take 1 tablet by mouth 4 (  four) times daily as needed for diarrhea or loose stools. 30 tablet 0  . furosemide (LASIX) 40 MG tablet TAKE 1 TABLET DAILY 90 tablet 0  . Heparin Lock Flush (HEPARIN FLUSH, PORCINE,) 100 UNIT/ML injection 2.60m ( 250 ) per picc line flush. Use 1 syringe per port. 30 Syringe 3  . HYDROmorphone (DILAUDID) 4 MG tablet Take 1 tablet (4 mg total) by mouth every 4 (four) hours as needed for severe pain. 120 tablet 0  . isosorbide dinitrate (ISORDIL) 30 MG tablet Take 30 mg by mouth daily.    .Marland Kitchenlisinopril (PRINIVIL,ZESTRIL) 20 MG tablet TAKE 1 TABLET DAILY 90 tablet 2  . Multiple Vitamin (MULTIVITAMIN) capsule Take 1 capsule by mouth every morning.     . nitroGLYCERIN (NITROSTAT) 0.4 MG SL tablet Place 1 tablet (0.4 mg total) under the tongue every 5 (five) minutes as needed for chest pain. 25 tablet 3  . polyethylene glycol (MIRALAX / GLYCOLAX) packet Take 17 g by mouth daily as needed. 14 each 0  . rOPINIRole (REQUIP) 3 MG tablet Take 1 tablet (3 mg total) by mouth at bedtime. 90 tablet 11  . sodium chloride 0.9 % SOLN 250 mL with cyclophosphamide 1 G SOLR 300 mg/m2 Inject 300 mg/m2 into the vein every 14 (fourteen) days.    . Sodium Chloride Flush 0.9 % SOLN injection Inject 10 mLs into the vein as directed. 60 Syringe 3  . tamsulosin (FLOMAX) 0.4 MG CAPS capsule Take 0.4 mg by mouth at bedtime.    . vitamin C (ASCORBIC ACID) 500 MG tablet Take  250 mg by mouth 2 (two) times daily.     . Zoledronic Acid (ZOMETA IV) Inject 1 each into the vein once.    . isosorbide mononitrate (IMDUR) 60 MG 24 hr tablet Take 1 tablet (60 mg total) by mouth daily. 90 tablet 3   No current facility-administered medications for this visit.   Facility-Administered Medications Ordered in Other Visits  Medication Dose Route Frequency Provider Last Rate Last Dose  . sodium chloride 0.9 % injection 3 mL  3 mL Intravenous PRN GChauncey Cruel MD        Allergies: Allergies  Allergen Reactions  . Morphine And Related Other (See Comments)    Agitated with morphine drip    Social History: The patient  reports that he quit smoking about 31 years ago. His smoking use included Cigarettes. He has a 20 pack-year smoking history. He has never used smokeless tobacco. He reports that he drinks alcohol. He reports that he does not use illicit drugs.   Family History: The patient's family history includes Heart attack in his mother; Heart disease in his father.   Review of Systems: Please see the history of present illness.   Otherwise, the review of systems is positive for none.   All other systems are reviewed and negative.   Physical Exam: VS:  BP 110/58 mmHg  Pulse 76  Ht '5\' 10"'$  (1.778 m)  Wt 179 lb 1.9 oz (81.248 kg)  BMI 25.70 kg/m2 .  BMI Body mass index is 25.7 kg/(m^2).  Wt Readings from Last 3 Encounters:  12/22/15 179 lb 1.9 oz (81.248 kg)  11/09/15 184 lb 3.2 oz (83.553 kg)  11/03/15 183 lb 12.8 oz (83.371 kg)   Oxygen sat with walking to the exam room dropped to 89%.   General: Pleasant. He looks more frail since I last saw him. He is alert and in no acute distress. Color is pale.  HEENT: Normal. Neck: Supple, no JVD, carotid bruits, or masses noted.  Cardiac: Regular rate and rhythm. No murmurs, rubs, or gallops. No edema.  Respiratory:  Lungs are clear to auscultation bilaterally with normal work of breathing.  GI: Soft and nontender.   MS: No deformity or atrophy. Gait and ROM intact. He is using a walker.  Skin: Warm and dry. Color is sallow.  Neuro:  Strength and sensation are intact and no gross focal deficits noted.  Psych: Alert, appropriate and with normal affect.   LABORATORY DATA:  EKG:  EKG is not ordered today.  Lab Results  Component Value Date   WBC 7.7 12/14/2015   HGB 10.1* 12/14/2015   HCT 31.2* 12/14/2015   PLT 175 12/14/2015   GLUCOSE 121 12/14/2015   CHOL 102 08/13/2013   TRIG 75 08/13/2013   HDL 37* 08/13/2013   LDLCALC 50 08/13/2013   ALT 22 12/14/2015   AST 43* 12/14/2015   NA 135* 12/14/2015   K 4.0 12/14/2015   CL 110 06/04/2015   CREATININE 1.4* 12/14/2015   BUN 18.6 12/14/2015   CO2 23 12/14/2015   TSH 5.772* 05/21/2015   PSA 3.75 05/30/2006   INR 1.08 06/03/2015    BNP (last 3 results) No results for input(s): BNP in the last 8760 hours.  ProBNP (last 3 results) No results for input(s): PROBNP in the last 8760 hours.   Other Studies Reviewed Today:  Echo Study Conclusions from 11/2014  - Left ventricle: Septal and apical hypokinesis posterior lateral and inferobasal akinesis. The cavity size was moderately dilated. Wall thickness was increased in a pattern of mild LVH. The estimated ejection fraction was 35%. - Mitral valve: There was mild regurgitation. - Left atrium: The atrium was moderately dilated. - Atrial septum: No defect or patent foramen ovale was identified.   Myoview Impression from 11/2014 Exercise Capacity: Lexiscan with no exercise. BP Response: Normal blood pressure response. Clinical Symptoms: Mild chest pain/dyspnea. ECG Impression: No significant ECG changes with Lexiscan. Comparison with Prior Nuclear Study: EF 41% with inferior scar  Overall Impression: High risk stress nuclear study with moderate intensity, large size fixed inferior and apical scar. No reversible ischemia.  LV Ejection Fraction: 27%. LV Wall Motion: Global  hypokinesis with inferior akinesis/dyskinesis   Pixie Casino, MD, [place] Certified in Nuclear Cardiology Attending Cardiologist [facility]    Assessment / Plan: 1. Dyspnea on exertion. Multifactorial. Suspect component of CHF but also has anemia and marked deconditioning. Treatment remains very limited. Has been unable to tolerate higher lasix dose due to frequent urination. There is a possibility of coronary ischemia since he is over 14 years out from CABG but his priormyoview did not show definite ischemia. We will recheck BNP today. May need to try and increase Lasix but will see what the BNP shows.   2. Chronic systolic CHF. EF is stable by Echo at 35%. Patient has a known ischemic cardiomyopathy. Would hold on repeating echo for now.   3. Multiple myeloma - followed by oncology. On Decadron and Cytoxan.   4. HLD - on statin therapy.   5. Coronary disease status post CABG in 2002. Dyspnea could be anginal equivalent symptoms and in fact this is how he initially presented. Myoview study shows a large inferior and apical scar without ischemia. He has had some "fullness" that sounds fleeting - will try to increase his Imdur. Continuemedical therapy for now.  6. Hyponatremia. Improved.  7. Anemia- chronic. On Aranesp as needed  per oncology.  8. CKD.   9. PAD s/p left iliac intervention. Off plavix.   Overall, he seems to just be in a declining fashion. May need to start considering palliative care. Briefly approached this today.   Current medicines are reviewed with the patient today.  The patient does not have concerns regarding medicines other than what has been noted above.  The following changes have been made:  See above.  Labs/ tests ordered today include:    Orders Placed This Encounter  Procedures  . Basic metabolic panel  . Brain natriuretic peptide     Disposition:   FU with me in 2 weeks.   Patient is agreeable to this plan and will call if  any problems develop in the interim.   Signed: Burtis Junes, RN, ANP-C 12/22/2015 9:06 AM  [facility] [address] [place],   [address] Phone: [phone] Fax: [phone]

## 2015-12-22 NOTE — Patient Instructions (Addendum)
We will be checking the following labs today - BMET and BNP   Medication Instructions:    Continue with your current medicines. BUT  I am increasing the Imdur to 60 mg - take each AM    Testing/Procedures To Be Arranged:  N/A  Follow-Up:   See me back in about 2 to 3 weeks.     Other Special Instructions:   N/A    If you need a refill on your cardiac medications before your next appointment, please call your pharmacy.   Call the [place] office at [phone] if you have any questions, problems or concerns.

## 2015-12-24 ENCOUNTER — Telehealth: Payer: Self-pay | Admitting: Nurse Practitioner

## 2015-12-24 NOTE — Telephone Encounter (Signed)
I called and spoke to [name] - he says he feels a little better this afternoon. The increase in lasix has really caused significant urinary frequency - ok to cut back to his usual dose. I told him that I do not think the back pain is coming from the increase in Imdur. He is willing to continue for now. I told him to have [place] call me if needed.

## 2015-12-24 NOTE — Telephone Encounter (Signed)
S/w pt's wife pt stated has never felt so lousy in his life.  I asked pt to describe and stated was just washed out and yesterday having pain in middle and lower back.  Stated can the additional lasix and the increase in imdur be causing this.  I stated probably not but t/w Truitt Merle, NP, to advise.

## 2015-12-24 NOTE — Telephone Encounter (Signed)
New messge      Talk to Cecille Rubin about how bad he feels---he was seen recently and his medication was changed.  Please call

## 2015-12-25 ENCOUNTER — Telehealth: Payer: Self-pay | Admitting: Physician Assistant

## 2015-12-25 NOTE — Telephone Encounter (Signed)
Paged by answering service, the patient feeling very weak since increased in Imdur 60mg  by Cecille Rubin 12/22/15. He mostly laying in bed, fatigued, appetitie has been decreased, SOB stable. Has R sided dull achy pain. No radiation. Also has separate back pain. Intermittent cough.  The patient denies nausea, vomiting, fever,  palpitations, orthopnea, PND, dizziness, syncope,  congestion, abdominal pain, hematochezia, melena, lower extremity edema.  Advised to reduced imdur to 30mg  qd from tomorrow (already took 60mg  today), continue lasix 40mg . If no improvement or worsening symptoms, advised to go to ER for further evaluation. The patient aggress.    Timothy [name], [place]

## 2015-12-26 ENCOUNTER — Encounter: Payer: Self-pay | Admitting: Nurse Practitioner

## 2015-12-27 ENCOUNTER — Telehealth: Payer: Self-pay | Admitting: Nurse Practitioner

## 2015-12-27 ENCOUNTER — Encounter: Payer: Self-pay | Admitting: Oncology

## 2015-12-27 DIAGNOSIS — R0600 Dyspnea, unspecified: Secondary | ICD-10-CM

## 2015-12-27 NOTE — Telephone Encounter (Signed)
Received MyChart message this am - has been pretty "miserable" over the last several days. Has been in the bed and more short of breath as well. He has also been talking about dying more with his wife.  Spoke to on call PA this weekend - Imdur cut back. ? Of ordering CXR.   Long discussion with [name] - he may be in a declining overall fashion - may need to consider Hospice/Palliative Care.  Will arrange for CXR.   Overall prognosis felt to be poor.   Burtis Junes, RN, [facility] [address] [place], [place]  [phone]

## 2015-12-28 ENCOUNTER — Other Ambulatory Visit: Payer: Self-pay

## 2015-12-28 ENCOUNTER — Ambulatory Visit (HOSPITAL_COMMUNITY)
Admission: RE | Admit: 2015-12-28 | Discharge: 2015-12-28 | Disposition: A | Discharge status: Home / Self Care | Payer: Medicare Other | Source: Ambulatory Visit | Attending: Nurse Practitioner | Admitting: Nurse Practitioner

## 2015-12-28 ENCOUNTER — Ambulatory Visit: Payer: Medicare Other

## 2015-12-28 ENCOUNTER — Ambulatory Visit (HOSPITAL_BASED_OUTPATIENT_CLINIC_OR_DEPARTMENT_OTHER)
Admission: RE | Admit: 2015-12-28 | Discharge: 2015-12-28 | Disposition: A | Discharge status: Home / Self Care | Payer: Medicare Other | Source: Ambulatory Visit | Attending: Nurse Practitioner | Admitting: Nurse Practitioner

## 2015-12-28 ENCOUNTER — Other Ambulatory Visit: Payer: Self-pay | Admitting: Nurse Practitioner

## 2015-12-28 ENCOUNTER — Telehealth: Payer: Self-pay | Admitting: Oncology

## 2015-12-28 ENCOUNTER — Encounter: Payer: Self-pay | Admitting: Nurse Practitioner

## 2015-12-28 ENCOUNTER — Other Ambulatory Visit (HOSPITAL_BASED_OUTPATIENT_CLINIC_OR_DEPARTMENT_OTHER): Payer: Medicare Other

## 2015-12-28 ENCOUNTER — Ambulatory Visit (HOSPITAL_BASED_OUTPATIENT_CLINIC_OR_DEPARTMENT_OTHER): Payer: Medicare Other

## 2015-12-28 ENCOUNTER — Ambulatory Visit (INDEPENDENT_AMBULATORY_CARE_PROVIDER_SITE_OTHER): Payer: Medicare Other | Admitting: Nurse Practitioner

## 2015-12-28 ENCOUNTER — Telehealth: Payer: Self-pay

## 2015-12-28 VITALS — BP 140/70 | HR 88 | Temp 98.5°F | Ht 70.0 in

## 2015-12-28 VITALS — BP 147/63 | HR 82 | Temp 97.9°F | Resp 22

## 2015-12-28 DIAGNOSIS — I255 Ischemic cardiomyopathy: Secondary | ICD-10-CM | POA: Diagnosis not present

## 2015-12-28 DIAGNOSIS — R945 Abnormal results of liver function studies: Secondary | ICD-10-CM

## 2015-12-28 DIAGNOSIS — C9000 Multiple myeloma not having achieved remission: Secondary | ICD-10-CM | POA: Diagnosis present

## 2015-12-28 DIAGNOSIS — R161 Splenomegaly, not elsewhere classified: Secondary | ICD-10-CM | POA: Insufficient documentation

## 2015-12-28 DIAGNOSIS — R531 Weakness: Secondary | ICD-10-CM | POA: Diagnosis not present

## 2015-12-28 DIAGNOSIS — R932 Abnormal findings on diagnostic imaging of liver and biliary tract: Secondary | ICD-10-CM | POA: Insufficient documentation

## 2015-12-28 DIAGNOSIS — D630 Anemia in neoplastic disease: Secondary | ICD-10-CM | POA: Diagnosis not present

## 2015-12-28 DIAGNOSIS — R0600 Dyspnea, unspecified: Secondary | ICD-10-CM | POA: Insufficient documentation

## 2015-12-28 DIAGNOSIS — I2510 Atherosclerotic heart disease of native coronary artery without angina pectoris: Secondary | ICD-10-CM

## 2015-12-28 DIAGNOSIS — R7989 Other specified abnormal findings of blood chemistry: Secondary | ICD-10-CM | POA: Insufficient documentation

## 2015-12-28 DIAGNOSIS — R938 Abnormal findings on diagnostic imaging of other specified body structures: Secondary | ICD-10-CM | POA: Insufficient documentation

## 2015-12-28 DIAGNOSIS — Z452 Encounter for adjustment and management of vascular access device: Secondary | ICD-10-CM

## 2015-12-28 LAB — COMPREHENSIVE METABOLIC PANEL
ALK PHOS: 198 U/L — AB (ref 40–150)
ALT: 27 U/L (ref 0–55)
ANION GAP: 12 meq/L — AB (ref 3–11)
AST: 104 U/L — ABNORMAL HIGH (ref 5–34)
Albumin: 3 g/dL — ABNORMAL LOW (ref 3.5–5.0)
BUN: 28.1 mg/dL — AB (ref 7.0–26.0)
CALCIUM: 8.3 mg/dL — AB (ref 8.4–10.4)
CHLORIDE: 104 meq/L (ref 98–109)
CO2: 19 mEq/L — ABNORMAL LOW (ref 22–29)
CREATININE: 1.6 mg/dL — AB (ref 0.7–1.3)
EGFR: 40 mL/min/{1.73_m2} — ABNORMAL LOW (ref >90)
GLUCOSE: 101 mg/dL (ref 70–140)
POTASSIUM: 4 meq/L (ref 3.5–5.1)
Sodium: 134 mEq/L — ABNORMAL LOW (ref 136–145)
TOTAL PROTEIN: 7.4 g/dL (ref 6.4–8.3)
Total Bilirubin: 0.55 mg/dL (ref 0.20–1.20)

## 2015-12-28 LAB — CBC WITH DIFFERENTIAL/PLATELET
BASO%: 2.9 % — AB (ref 0.0–2.0)
Basophils Absolute: 0.2 10*3/uL — ABNORMAL HIGH (ref 0.0–0.1)
EOS%: 2.2 % (ref 0.0–7.0)
Eosinophils Absolute: 0.2 10*3/uL (ref 0.0–0.5)
HEMATOCRIT: 29.2 % — AB (ref 38.4–49.9)
HGB: 9.3 g/dL — ABNORMAL LOW (ref 13.0–17.1)
LYMPH%: 19.9 % (ref 14.0–49.0)
MCH: 26.3 pg — AB (ref 27.2–33.4)
MCHC: 31.8 g/dL — AB (ref 32.0–36.0)
MCV: 82.7 fL (ref 79.3–98.0)
MONO#: 0.7 10*3/uL (ref 0.1–0.9)
MONO%: 7.9 % (ref 0.0–14.0)
NEUT#: 5.5 10*3/uL (ref 1.5–6.5)
NEUT%: 67.1 % (ref 39.0–75.0)
Platelets: 94 10*3/uL — ABNORMAL LOW (ref 140–400)
RBC: 3.53 10*6/uL — ABNORMAL LOW (ref 4.20–5.82)
RDW: 20.2 % — ABNORMAL HIGH (ref 11.0–14.6)
WBC: 8.2 10*3/uL (ref 4.0–10.3)
lymph#: 1.6 10*3/uL (ref 0.9–3.3)
nRBC: 3 % — ABNORMAL HIGH (ref 0–0)

## 2015-12-28 LAB — TECHNOLOGIST REVIEW

## 2015-12-28 MED ORDER — SODIUM CHLORIDE 0.9% FLUSH
10.0000 mL | INTRAVENOUS | Status: DC | PRN
  Administered 2015-12-28: 10 mL via INTRAVENOUS
  Filled 2015-12-28: qty 10

## 2015-12-28 MED ORDER — HEPARIN SOD (PORK) LOCK FLUSH 100 UNIT/ML IV SOLN
500.0000 [IU] | Freq: Once | INTRAVENOUS | Status: AC
  Administered 2015-12-28: 250 [IU] via INTRAVENOUS
  Filled 2015-12-28: qty 5

## 2015-12-28 MED ORDER — DARBEPOETIN ALFA 500 MCG/ML IJ SOSY
500.0000 ug | PREFILLED_SYRINGE | Freq: Once | INTRAMUSCULAR | Status: AC
Start: 2015-12-28 — End: 2015-12-28
  Administered 2015-12-28: 500 ug via SUBCUTANEOUS
  Filled 2015-12-28: qty 1

## 2015-12-28 NOTE — Patient Instructions (Addendum)
We will be checking the following labs today - TSH and HPF   Medication Instructions:    Continue with your current medicines.     Testing/Procedures To Be Arranged:  Abdominal ultrasound this afternoon at 5:30 at [facility]  Follow-Up:   I will get you a visit with Dr. [name]     Other Special Instructions:   N/A    If you need a refill on your cardiac medications before your next appointment, please call your pharmacy.   Call the [place] office at [phone] if you have any questions, problems or concerns.

## 2015-12-28 NOTE — Telephone Encounter (Signed)
Called Patient and l/m notifying Patient of upcoming Scheduled Appointment.       [facility].

## 2015-12-28 NOTE — Progress Notes (Signed)
CARDIOLOGY OFFICE NOTE  Date:  12/28/2015    Timothy [name] Date of Birth: 1928-06-12 Medical Record #[id number]  PCP:  Timothy Cruel, MD  Cardiologist:  Timothy [name]    Chief Complaint  Patient presents with  . Cardiomyopathy  . Coronary Artery Disease  . Congestive Heart Failure    6 day check - seen for Timothy [name]    History of Present Illness: Timothy [name] is a 82 y.o. male who presents today for a follow up visit. Seen for Timothy [name].   He has a complex medical history which includes an ischemic CM with an EF of 35%. He has multiple myeloma with progressive anemia and is followed closely by oncology. He is on Decadron and Cytoxan. Other problems include HLD, depression, remote CABG in 2002, CKD, neuropathy, anemia and gout. He also has a history of hyponatremia. He developed critical limb ischemia and underwent left femoral endarterectomy and patch angioplasty in May 2015. In 2016, he noted increased edema and developed an ulcer on his left second toe. Evaluation revealed a 95% left iliac stenosis and he underwent rotational atherectomy and stenting of this by Timothy [name] in July of 2016. ABIs have improved.   He was last seen in December. More issues with hip pain. Has had difficulty taking more lasix due to frequent urination.   I saw him just 6 days ago - more short of breath. Weight was actually down. He wanted to try and continue with medical management. I increased his Imdur. Tried to increase his Lasix for a few days.   Several phone calls since last week's visit - he has spent more time in the bed. Lots of back pain. More weakness. Gasping at times for breath. Had to cut back the Imdur and the Lasix but no real change noted. I sent him for a CXR.   Comes in today. Here with Timothy [name] and her sister. They were called from this office and his appointment was moved up. He continues to do poorly. He has been talking about dying to [place] at home. He seems to be  downplaying his symptoms. Timothy [name] notes more "gasping" at times and more dyspnea overall. Weaker. Legs gave away today and he almost fell. Belly bloated but no real pain. Does still have his gallbladder. He is not eating. Has had basically nothing since 8:30 AM today. He says he is not hungry. Hurts all over. Feels a little warm to touch but no fever. He is walking just a few steps. Overall, just continues to do poorly.   Past Medical History  Diagnosis Date  . ASCVD (arteriosclerotic cardiovascular disease)   . Gout   . Hypertension   . Lumbar disc disease     post laminectomy  . History of echocardiogram 11/02/2010    EF range of 30 to 35% / There is hypokinsesis of the basal-mild inferolateral myocardium  . CHF (congestive heart failure) ([place])   . Heart murmur   . Ischemic cardiomyopathy   . Renal insufficiency   . Peripheral neuropathy ([place])   . Hyperlipidemia   . SOB (shortness of breath)   . Fatigue   . Peripheral neuropathy ([place])   . Coronary artery disease     CABG 2002 by Timothy [name] with LIMA to LAD, SVG to intermediate, SVG to LCX, & SVG to PL. Last nuclear in 2012 showing large inferior scar with EF of 39%.   . Blood transfusion 10/2011; 07/2013    "multiple myeloma; when  I broke my leg"  . History of shingles MARCH 2009    SHINGLE LESIONS WERE AROUND RIGHT EYE--PT HAS RESIDUAL ITCHING AROUND THE EYE.  . Osteomyelitis ([place])     s/p toe amputation  . Chronic systolic heart failure (H[facility])     EF of 30% per echo 02/2012  . Protein malnutrition ([place])   . Critical lower limb ischemia   . Peripheral vascular disease ([place])   . Hx of cardiovascular stress test     Lexiscan Myoview (1/16):  Large inferior and apical scar; no ischemia, EF 27%;  High Risk  . Inferior myocardial infarction ([place]) 1986  . Pulmonary embolism ([place])     "when I had big toe amputated"  . Chronic anemia     "due to the multiple myeloma"  . Chronic lower back pain   . Multiple myeloma ([employer]) dx'd 04/2006     with recurrence of increasing problems  Timothy [name] -ONCOLOGIST.  PT HAS BEEN OFF CHEMO SINCE HIS HOSPITALIZATION FEB 2013 FOR LEFT FOOT INFECTION  . Basal cell carcinoma     "chest left arm"    Past Surgical History  Procedure Laterality Date  . Lumbar laminectomy    . Cardiac catheterization  06/20/2000    Severe coronary disease (totally occluded right artery, 90% left circumflex, 50-60% intermediate, and 90-95% ostial left anterior descending)   . Coronary artery bypass graft  2002    x4 / Left internal mammary artery to the LAD.  / Saphenous vein graft to the right posterolateral.  /  Saphenous vein graft to  the ramus intermedius. / Saphenous vein graft to the circumflex marginal     . Amputation  03/06/2012    Procedure: AMPUTATION RAY;  Surgeon: Timothy Corning, MD;  Location: [facility];  Service: Orthopedics;  Laterality: Left;  First Ray Amputation  . Intramedullary (im) nail intertrochanteric Right 08/17/2013    Procedure: INTRAMEDULLARY (IM) NAIL INTERTROCHANTRIC;  Surgeon: Timothy Killings, MD;  Location: [facility];  Service: Orthopedics;  Laterality: Right;  . Endarterectomy femoral Left 03/24/2014    Procedure: Left Common Femoral  Artery Endarterectomy with Vein Patch Angioplasty,  Ultrasound ;  Surgeon: Timothy Mould, MD;  Location: [facility];  Service: Vascular;  Laterality: Left;  . Cataract extraction w/ intraocular lens  implant, bilateral Bilateral   . Lower extremity angiogram N/A 03/23/2014    Procedure: LOWER EXTREMITY ANGIOGRAM;  Surgeon: Timothy Harp, MD;  Location: [facility] CATH LAB;  Service: Cardiovascular;  Laterality: N/A;  . Peripheral vascular catheterization N/A 05/27/2015    Procedure: Lower Extremity Angiography;  Surgeon: Timothy Harp, MD;  Location: [facility] CV LAB;  Service: Cardiovascular;  Laterality: N/A;  . Peripheral vascular catheterization  06/03/2015    Procedure: Peripheral Vascular Intervention;  Surgeon: Timothy Harp, MD;  Location: [facility] CV  LAB;  Service: Cardiovascular;;  Lt Common Iliac  . Tonsillectomy  ~ 1936  . Inguinal hernia repair Bilateral   . Fracture surgery    . Back surgery    . Basal cell carcinoma excision      "chest left arm"  . Toe amputation Left ~ 2014     Medications: Current Outpatient Prescriptions  Medication Sig Dispense Refill  . acyclovir (ZOVIRAX) 400 MG tablet Take 1 tablet (400 mg total) by mouth 2 (two) times daily. 180 tablet 2  . allopurinol (ZYLOPRIM) 300 MG tablet TAKE ONE-HALF (1/2) TABLET DAILY 90 tablet 2  . aspirin 81 MG chewable tablet Chew  1 tablet (81 mg total) by mouth daily.    Marland Kitchen atorvastatin (LIPITOR) 10 MG tablet TAKE 1 TABLET EVERY EVENING 90 tablet 2  . B Complex-Biotin-FA (B COMPLETE PO) Take 1 tablet by mouth daily.     . carvedilol (COREG) 6.25 MG tablet TAKE 1 TABLET TWICE A DAY WITH MEALS 180 tablet 2  . Coenzyme Q10 (CO Q-10 PO) Take 1 tablet by mouth every morning.     . darbepoetin (ARANESP) 200 MCG/0.4ML SOLN injection Inject 200 mcg into the skin as directed.    . diphenoxylate-atropine (LOMOTIL) 2.5-0.025 MG tablet Take 1 tablet by mouth 4 (four) times daily as needed for diarrhea or loose stools. 30 tablet 0  . furosemide (LASIX) 40 MG tablet TAKE 1 TABLET DAILY 90 tablet 0  . Heparin Lock Flush (HEPARIN FLUSH, PORCINE,) 100 UNIT/ML injection 2.41m ( 250 ) per picc line flush. Use 1 syringe per port. 30 Syringe 3  . HYDROmorphone (DILAUDID) 4 MG tablet Take 1 tablet (4 mg total) by mouth every 4 (four) hours as needed for severe pain. 120 tablet 0  . isosorbide dinitrate (ISORDIL) 30 MG tablet Take 30 mg by mouth daily.    . isosorbide mononitrate (IMDUR) 60 MG 24 hr tablet Take 1 tablet (60 mg total) by mouth daily. 90 tablet 3  . lisinopril (PRINIVIL,ZESTRIL) 20 MG tablet TAKE 1 TABLET DAILY 90 tablet 2  . Multiple Vitamin (MULTIVITAMIN) capsule Take 1 capsule by mouth every morning.     . nitroGLYCERIN (NITROSTAT) 0.4 MG SL tablet Place 1 tablet (0.4 mg total)  under the tongue every 5 (five) minutes as needed for chest pain. 25 tablet 3  . polyethylene glycol (MIRALAX / GLYCOLAX) packet Take 17 g by mouth daily as needed. 14 each 0  . rOPINIRole (REQUIP) 3 MG tablet Take 1 tablet (3 mg total) by mouth at bedtime. 90 tablet 11  . sodium chloride 0.9 % SOLN 250 mL with cyclophosphamide 1 G SOLR 300 mg/m2 Inject 300 mg/m2 into the vein every 14 (fourteen) days.    . Sodium Chloride Flush 0.9 % SOLN injection Inject 10 mLs into the vein as directed. 60 Syringe 3  . tamsulosin (FLOMAX) 0.4 MG CAPS capsule Take 0.4 mg by mouth at bedtime.    . vitamin C (ASCORBIC ACID) 500 MG tablet Take 250 mg by mouth 2 (two) times daily.     . Zoledronic Acid (ZOMETA IV) Inject 1 each into the vein once.     No current facility-administered medications for this visit.   Facility-Administered Medications Ordered in Other Visits  Medication Dose Route Frequency Provider Last Rate Last Dose  . sodium chloride 0.9 % injection 3 mL  3 mL Intravenous PRN GChauncey Cruel MD        Allergies: Allergies  Allergen Reactions  . Morphine And Related Other (See Comments)    Agitated with morphine drip    Social History: The patient  reports that he quit smoking about 31 years ago. His smoking use included Cigarettes. He has a 20 pack-year smoking history. He has never used smokeless tobacco. He reports that he drinks alcohol. He reports that he does not use illicit drugs.   Family History: The patient's family history includes Heart attack in his mother; Heart disease in his father.   Review of Systems: Please see the history of present illness.   Otherwise, the review of systems is positive for none.   All other systems are reviewed and negative.  Physical Exam: VS:  BP 140/70 mmHg  Pulse 88  Temp(Src) 98.5 F (36.9 C)  Ht _0  (1.778 m)  SpO2 92% .  BMI There is no weight on file to calculate BMI.  Wt Readings from Last 3 Encounters:  12/22/15 179 lb 1.9  oz (81.248 kg)  11/09/15 184 lb 3.2 oz (83.553 kg)  11/03/15 183 lb 12.8 oz (83.371 kg)    General: Pleasant. He looks chronically ill - seems fatigued but alert and in no acute distress. He was examined in a wheelchair. He was not able to stand to weigh today.  HEENT: Normal. Neck: Supple, no JVD, carotid bruits, or masses noted.  Cardiac: Regular rate and rhythm. Soft S3. No edema.  Respiratory:  Lungs are fairly clear - he does get a little short of breath with just talking.  GI: Abdomen feels a little tight.  MS: No deformity or atrophy. Gait not tested.  Skin: Hot to touch but no fever. Color is normal.  Neuro:  Strength and sensation are intact and no gross focal deficits noted.  Psych: Alert, appropriate and with normal affect.   LABORATORY DATA:  EKG:  EKG is not ordered today.  Lab Results  Component Value Date   WBC 8.2 12/28/2015   HGB 9.3* 12/28/2015   HCT 29.2* 12/28/2015   PLT 94 Large platelets present* 12/28/2015   GLUCOSE 101 12/28/2015   CHOL 102 08/13/2013   TRIG 75 08/13/2013   HDL 37* 08/13/2013   LDLCALC 50 08/13/2013   ALT 27 12/28/2015   AST 104* 12/28/2015   NA 134* 12/28/2015   K 4.0 12/28/2015   CL 106 12/22/2015   CREATININE 1.6* 12/28/2015   BUN 28.1* 12/28/2015   CO2 19* 12/28/2015   TSH 5.772* 05/21/2015   PSA 3.75 05/30/2006   INR 1.08 06/03/2015   Brain Natriuretic Peptide 12/22/2015 0.0 - 100.0 pg/mL 212.4 (H)        BNP (last 3 results) No results for input(s): BNP in the last 8760 hours.  ProBNP (last 3 results) No results for input(s): PROBNP in the last 8760 hours.   Other Studies Reviewed Today:  Echo Study Conclusions from 11/2014  - Left ventricle: Septal and apical hypokinesis posterior lateral and inferobasal akinesis. The cavity size was moderately dilated. Wall thickness was increased in a pattern of mild LVH. The estimated ejection fraction was 35%. - Mitral valve: There was mild regurgitation. - Left  atrium: The atrium was moderately dilated. - Atrial septum: No defect or patent foramen ovale was identified.   Myoview Impression from 11/2014 Exercise Capacity: Lexiscan with no exercise. BP Response: Normal blood pressure response. Clinical Symptoms: Mild chest pain/dyspnea. ECG Impression: No significant ECG changes with Lexiscan. Comparison with Prior Nuclear Study: EF 41% with inferior scar  Overall Impression: High risk stress nuclear study with moderate intensity, large size fixed inferior and apical scar. No reversible ischemia.  LV Ejection Fraction: 27%. LV Wall Motion: Global hypokinesis with inferior akinesis/dyskinesis   Pixie Casino, MD, [facility] Board Certified in Nuclear Cardiology Attending Cardiologist [place]   CXR FINDINGS 12/28/2015: The images were taken with the patient in a wheelchair and inspiration is not optimal, with bibasilar linear atelectasis present. No focal infiltrate or effusion is seen. It is noted that the right PICC line extends cephalad presumably into the right internal jugular vein. Cardiomegaly is stable. Median sternotomy sutures are noted. No acute bony abnormality is seen.  IMPRESSION: 1. Poor inspiration with bibasilar atelectasis. 2.  Right PICC line tip extends cephalad probably into the right internal jugular vein.   Electronically Signed  By: Ivar Drape M.D.  On: 12/28/2015 13:14   Assessment / Plan: 1. Dyspnea on exertion. Suspect this to still be multifactorial. Suspect component of CHF but also has anemia and marked deconditioning as well as his multiple myeloma. Treatment remains very limited. We have had a pretty candid discussion about how aggressive to be, what other tests he wants, whether to admit to hospital or not, etc.  He does not wish to be hospitalized but would like to have further testing. We discussed that Timothy [name] may be needing additional help/support in the home. Need to consider Hospice  referral.   2. Elevated LFTs - will recheck to verify. Needs abdominal ultrasound or perhaps CT scan - he wishes to have an ultrasound. Will see what labs show - probably stop statin. We've been able to get this scheduled for later this afternoon.   3. Chronic systolic CHF. EF by Echo at 35% from one year ago. I do not think a repeat study will change our plan of care. He is not able to tolerate higher doses of Lasix. Very limited options.   4. Multiple myeloma - followed by oncology. On Decadron and Cytoxan. Will see if we can get him back to see Dr. [name]. I have called and left a message for Dr. [name] and his nurse.   5. Coronary disease status post CABG in 2002. Dyspnea could be anginal equivalent symptoms and in fact this is how he initially presented. Prior Myoview study showed a large inferior and apical scar without ischemia. Continuemedical therapy for now. He has cut his dose of Imdur back but I do not think his current symptoms were worsened by the increased dose.   6. Hyponatremia. Improved.  7. Anemia- chronic. On Aranesp as needed per oncology.  8. CKD.   9. PAD s/p left iliac intervention. Off plavix.   Current medicines are reviewed with the patient today.  The patient does not have concerns regarding medicines other than what has been noted above.  The following changes have been made:  See above.  Labs/ tests ordered today include:    Orders Placed This Encounter  Procedures  . US Abdomen Complete  . Hepatic function panel  . TSH     Disposition:   Further disposition pending. Overall prognosis very poor/tenuous.    Patient is agreeable to this plan and will call if any problems develop in the interim.   Signed: Burtis Junes, RN, ANP-C 12/28/2015 3:33 PM  [facility] [address] [place], [place]  [address] Phone: [phone] Fax: [phone]

## 2015-12-28 NOTE — Telephone Encounter (Signed)
[name] called from the [facility] requesting an appt for patient this week.  POF placed for patient to see Dr. [name] on 12/30/15.

## 2015-12-28 NOTE — Patient Instructions (Signed)
PICC Home Guide A peripherally inserted central catheter (PICC) is a long, thin, flexible tube that is inserted into a vein in the upper arm. It is a form of intravenous (IV) access. It is considered to be a "central" line because the tip of the PICC ends in a large vein in your chest. This large vein is called the superior vena cava (SVC). The PICC tip ends in the SVC because there is a lot of blood flow in the SVC. This allows medicines and IV fluids to be quickly distributed throughout the body. The PICC is inserted using a sterile technique by a specially trained nurse or physician. After the PICC is inserted, a chest X-ray exam is done to be sure it is in the correct place.  A PICC may be placed for different reasons, such as:  To give medicines and liquid nutrition that can only be given through a central line. Examples are:  Certain antibiotic treatments.  Chemotherapy.  Total parenteral nutrition (TPN).  To take frequent blood samples.  To give IV fluids and blood products.  If there is difficulty placing a peripheral intravenous (PIV) catheter. If taken care of properly, a PICC can remain in place for several months. A PICC can also allow a person to go home from the hospital early. Medicine and PICC care can be managed at home by a family member or home health care team. WHAT PROBLEMS CAN HAPPEN WHEN I HAVE A PICC? Problems with a PICC can occasionally occur. These may include the following:  A blood clot (thrombus) forming in or at the tip of the PICC. This can cause the PICC to become clogged. A clot-dissolving medicine called tissue plasminogen activator (tPA) can be given through the PICC to help break up the clot.  Inflammation of the vein (phlebitis) in which the PICC is placed. Signs of inflammation may include redness, pain at the insertion site, red streaks, or being able to feel a "cord" in the vein where the PICC is located.  Infection in the PICC or at the insertion  site. Signs of infection may include fever, chills, redness, swelling, or pus drainage from the PICC insertion site.  PICC movement (malposition). The PICC tip may move from its original position due to excessive physical activity, forceful coughing, sneezing, or vomiting.  A break or cut in the PICC. It is important to not use scissors near the PICC.  Nerve or tendon irritation or injury during PICC insertion. WHAT SHOULD I KEEP IN MIND ABOUT ACTIVITIES WHEN I HAVE A PICC?  You may bend your arm and move it freely. If your PICC is near or at the bend of your elbow, avoid activity with repeated motion at the elbow.  Rest at home for the remainder of the day following PICC line insertion.  Avoid lifting heavy objects as instructed by your health care provider.  Avoid using a crutch with the arm on the same side as your PICC. You may need to use a walker. WHAT SHOULD I KNOW ABOUT MY PICC DRESSING?  Keep your PICC bandage (dressing) clean and dry to prevent infection.  Ask your health care provider when you may shower. Ask your health care provider to teach you how to wrap the PICC when you do take a shower.  Change the PICC dressing as instructed by your health care provider.  Change your PICC dressing if it becomes loose or wet. WHAT SHOULD I KNOW ABOUT PICC CARE?  Check the PICC insertion site   daily for leakage, redness, swelling, or pain.  Do not take a bath, swim, or use hot tubs when you have a PICC. Cover PICC line with clear plastic wrap and tape to keep it dry while showering.  Flush the PICC as directed by your health care provider. Let your health care provider know right away if the PICC is difficult to flush or does not flush. Do not use force to flush the PICC.  Do not use a syringe that is less than 10 mL to flush the PICC.  Never pull or tug on the PICC.  Avoid blood pressure checks on the arm with the PICC.  Keep your PICC identification card with you at all  times.  Do not take the PICC out yourself. Only a trained clinical professional should remove the PICC. SEEK IMMEDIATE MEDICAL CARE IF:  Your PICC is accidentally pulled all the way out. If this happens, cover the insertion site with a bandage or gauze dressing. Do not throw the PICC away. Your health care provider will need to inspect it.  Your PICC was tugged or pulled and has partially come out. Do not  push the PICC back in.  There is any type of drainage, redness, or swelling where the PICC enters the skin.  You cannot flush the PICC, it is difficult to flush, or the PICC leaks around the insertion site when it is flushed.  You hear a "flushing" sound when the PICC is flushed.  You have pain, discomfort, or numbness in your arm, shoulder, or jaw on the same side as the PICC.  You feel your heart "racing" or skipping beats.  You notice a hole or tear in the PICC.  You develop chills or a fever. MAKE SURE YOU:   Understand these instructions.  Will watch your condition.  Will get help right away if you are not doing well or get worse.   This information is not intended to replace advice given to you by your health care provider. Make sure you discuss any questions you have with your health care provider.   Document Released: 05/13/2003 Document Revised: 11/27/2014 Document Reviewed: 07/14/2013 Elsevier Interactive Patient Education 2016 [employer]  

## 2015-12-29 LAB — HEPATIC FUNCTION PANEL
ALT: 25 U/L (ref 9–46)
AST: 107 U/L — ABNORMAL HIGH (ref 10–35)
Albumin: 3.6 g/dL (ref 3.6–5.1)
Alkaline Phosphatase: 201 U/L — ABNORMAL HIGH (ref 40–115)
Bilirubin, Direct: 0.2 mg/dL (ref <=0.2)
Indirect Bilirubin: 0.5 mg/dL (ref 0.2–1.2)
Total Bilirubin: 0.7 mg/dL (ref 0.2–1.2)
Total Protein: 7.1 g/dL (ref 6.1–8.1)

## 2015-12-29 LAB — TSH: TSH: 3.18 mIU/L (ref 0.40–4.50)

## 2015-12-30 ENCOUNTER — Ambulatory Visit (HOSPITAL_BASED_OUTPATIENT_CLINIC_OR_DEPARTMENT_OTHER): Payer: Medicare Other | Admitting: Oncology

## 2015-12-30 VITALS — BP 122/44 | HR 83 | Temp 97.3°F | Resp 18 | Ht 70.0 in | Wt 174.9 lb

## 2015-12-30 DIAGNOSIS — C9000 Multiple myeloma not having achieved remission: Secondary | ICD-10-CM | POA: Diagnosis not present

## 2015-12-30 DIAGNOSIS — G620 Drug-induced polyneuropathy: Secondary | ICD-10-CM

## 2015-12-30 DIAGNOSIS — C9001 Multiple myeloma in remission: Secondary | ICD-10-CM

## 2015-12-30 DIAGNOSIS — I5023 Acute on chronic systolic (congestive) heart failure: Secondary | ICD-10-CM

## 2015-12-30 DIAGNOSIS — I739 Peripheral vascular disease, unspecified: Secondary | ICD-10-CM

## 2015-12-30 MED ORDER — MIRTAZAPINE 15 MG PO TABS: 15.0000 mg | ORAL_TABLET | Freq: Every day | ORAL | Status: DC

## 2015-12-30 MED ORDER — DEXAMETHASONE 4 MG PO TABS: 4.0000 mg | ORAL_TABLET | Freq: Two times a day (BID) | ORAL | Status: DC

## 2015-12-30 NOTE — Progress Notes (Signed)
ID: [name]   DOB: Apr 22, 1928  MR#: [id number]  ASN#:[id number]  PCP:  SU: OTHER MD: Dorna Leitz, Alysia Penna, Georgia Duff, Geryl Councilman  CHIEF COMPLAINT: multiple myeloma  CURRENT THERAPY: [cyclophosphamide] ,darbepoetin   MYELOMA HISTORY: From the earlier summary:  Patient presented in April 2007 with symptomatic slowly progressive anemia, with normal iron parameters, folic acid, and vitamin B12. Plans were made to obtain bone marrow aspirate and biopsy as an outpatient, however the patient was hospitalized shortly thereafter with a creatinine of 8.6. Renal biopsy demonstrated the presence of  light chain deposition disease. Bone marrow biopsy demonstrated 37% plasma cells. A skeletal survey showed small calvarial lytic lesions, mild osteopenia, and cervical spondylosis. With a well-established diagnosis of light-chain multiple myeloma Dr. Melodie Bouillon subsequent treatments are as detailed below.   INTERVAL HISTORY: [name] returns today for follow up of his multiple myeloma and severe congestive heart failureaccompanied by his wife [name] and [place] sister Juliene Pina. From a myeloma point of view date has tolerated his treatments remarkably well and the kappa/lambda ratio continues to drop.  The problem is his congestive heart failure, which appears to be decompensating. He has had multiple recent visits with cardiology and they have attempted multiple adjustments in his medications but they are hitting walls and finding that there is really not much room therefore improvement. Accordingly they suggested we see him today with a hint that we should initiate end-of-life discussion   REVIEW OF SYSTEMS: Dick'tells me he was doing okay until a couple of weeks ago when "they started changing medications and everything got worse". He is having a hard time getting out of bed and when he does he is extremely unsteady. His wife  and either sister-in-law or nephew has to help him yet from one place to the other in the house. He has not fallen however. He has a hard time eating. Nothing tastes good. He has no appetite. It's hard to swallow and sometimes things come back up. He continues to have pain chiefly in the lower back but this is not worse than before and it is well-controlled on his Dilantin loaded. He is managing to have bowel movements as before. He has lost about 10 pounds in the last month however. He has pains in his chest and feels quite short of breath. He denies significant cough but in the morning does bring up some clear phlegm. He tells me his urine flow is "okay". There have been no unusual headaches, visual changes, or confusion. A detailed review of systems today was otherwise stable.  PAST MEDICAL HISTORY: Past Medical History  Diagnosis Date  . ASCVD (arteriosclerotic cardiovascular disease)   . Gout   . Hypertension   . Lumbar disc disease     post laminectomy  . History of echocardiogram 11/02/2010    EF range of 30 to 35% / There is hypokinsesis of the basal-mild inferolateral myocardium  . CHF (congestive heart failure) ([place])   . Heart murmur   . Ischemic cardiomyopathy   . Renal insufficiency   . Peripheral neuropathy ([place])   . Hyperlipidemia   . SOB (shortness of breath)   . Fatigue   . Peripheral neuropathy ([place])   . Coronary artery disease     CABG 2002 by Dr. Roxy Manns with LIMA to LAD, SVG to intermediate, SVG to LCX, & SVG to PL. Last nuclear in 2012 showing large inferior scar with EF of 39%.   . Blood  transfusion 10/2011; 07/2013    "multiple myeloma; when I broke my leg"  . History of shingles MARCH 2009    SHINGLE LESIONS WERE AROUND RIGHT EYE--PT HAS RESIDUAL ITCHING AROUND THE EYE.  . Osteomyelitis ([place])     s/p toe amputation  . Chronic systolic heart failure (H[facility])     EF of 30% per echo 02/2012  . Protein malnutrition ([place])   . Critical lower limb ischemia   . Peripheral  vascular disease ([place])   . Hx of cardiovascular stress test     Lexiscan Myoview (1/16):  Large inferior and apical scar; no ischemia, EF 27%;  High Risk  . Inferior myocardial infarction ([place]) 1986  . Pulmonary embolism ([place])     "when I had big toe amputated"  . Chronic anemia     "due to the multiple myeloma"  . Chronic lower back pain   . Multiple myeloma ([place]) dx'd 04/2006    with recurrence of increasing problems  DR. Quincy -ONCOLOGIST.  PT HAS BEEN OFF CHEMO SINCE HIS HOSPITALIZATION FEB 2013 FOR LEFT FOOT INFECTION  . Basal cell carcinoma     "chest left arm"   PAST SURGICAL HISTORY: Past Surgical History  Procedure Laterality Date  . Lumbar laminectomy    . Cardiac catheterization  06/20/2000    Severe coronary disease (totally occluded right artery, 90% left circumflex, 50-60% intermediate, and 90-95% ostial left anterior descending)   . Coronary artery bypass graft  2002    x4 / Left internal mammary artery to the LAD.  / Saphenous vein graft to the right posterolateral.  /  Saphenous vein graft to  the ramus intermedius. / Saphenous vein graft to the circumflex marginal     . Amputation  03/06/2012    Procedure: AMPUTATION RAY;  Surgeon: Alta Corning, MD;  Location: [facility];  Service: Orthopedics;  Laterality: Left;  First Ray Amputation  . Intramedullary (im) nail intertrochanteric Right 08/17/2013    Procedure: INTRAMEDULLARY (IM) NAIL INTERTROCHANTRIC;  Surgeon: Marybelle Killings, MD;  Location: [facility];  Service: Orthopedics;  Laterality: Right;  . Endarterectomy femoral Left 03/24/2014    Procedure: Left Common Femoral  Artery Endarterectomy with Vein Patch Angioplasty,  Ultrasound ;  Surgeon: Angelia Mould, MD;  Location: [facility];  Service: Vascular;  Laterality: Left;  . Cataract extraction w/ intraocular lens  implant, bilateral Bilateral   . Lower extremity angiogram N/A 03/23/2014    Procedure: LOWER EXTREMITY ANGIOGRAM;  Surgeon: Lorretta Harp, MD;  Location: [facility]  CATH [facility];  Service: Cardiovascular;  Laterality: N/A;  . Peripheral vascular catheterization N/A 05/27/2015    Procedure: Lower Extremity Angiography;  Surgeon: Lorretta Harp, MD;  Location: [facility];  Service: Cardiovascular;  Laterality: N/A;  . Peripheral vascular catheterization  06/03/2015    Procedure: Peripheral Vascular Intervention;  Surgeon: Lorretta Harp, MD;  Location: [facility];  Service: Cardiovascular;;  Lt Common Iliac  . Tonsillectomy  ~ 1936  . Inguinal hernia repair Bilateral   . Fracture surgery    . Back surgery    . Basal cell carcinoma excision      "chest left arm"  . 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 He and his wife [name] are the only ones at home.    ADVANCED DIRECTIVES:  [name] his wife is his healthcare part of attorney. At the 12/30/2015 visit the patient was very clear he would not want resuscitation or life support in case of a terminal event  HEALTH MAINTENANCE: Social History  Substance Use Topics  . Smoking status: Former Smoker -- 1.00 packs/day for 20 years    Types: Cigarettes    Quit date: 11/20/1984  . Smokeless tobacco: Never Used  . Alcohol Use: Yes     Comment: 06/03/2015 "might have a drink a few times/year"     Colonoscopy:  Bone density:  Lipid panel:  Allergies  Allergen Reactions  . Morphine And Related Other (See Comments)    Agitated with morphine drip    Current Outpatient Prescriptions  Medication Sig Dispense Refill  . acyclovir (ZOVIRAX) 400 MG tablet Take 1 tablet (400 mg total) by mouth 2 (two) times daily. 180 tablet 2  . allopurinol (ZYLOPRIM) 300 MG tablet TAKE ONE-HALF (1/2) TABLET DAILY 90 tablet 2  . aspirin 81 MG chewable tablet Chew 1 tablet (81 mg total) by mouth daily.    Marland Kitchen atorvastatin  (LIPITOR) 10 MG tablet TAKE 1 TABLET EVERY EVENING 90 tablet 2  . B Complex-Biotin-FA (B COMPLETE PO) Take 1 tablet by mouth daily.     . carvedilol (COREG) 6.25 MG tablet TAKE 1 TABLET TWICE A DAY WITH MEALS 180 tablet 2  . Coenzyme Q10 (CO Q-10 PO) Take 1 tablet by mouth every morning.     . darbepoetin (ARANESP) 200 MCG/0.4ML SOLN injection Inject 200 mcg into the skin as directed.    . diphenoxylate-atropine (LOMOTIL) 2.5-0.025 MG tablet Take 1 tablet by mouth 4 (four) times daily as needed for diarrhea or loose stools. 30 tablet 0  . furosemide (LASIX) 40 MG tablet TAKE 1 TABLET DAILY 90 tablet 0  . Heparin Lock Flush (HEPARIN FLUSH, PORCINE,) 100 UNIT/ML injection 2.51m ( 250 ) per picc line flush. Use 1 syringe per port. 30 Syringe 3  . HYDROmorphone (DILAUDID) 4 MG tablet Take 1 tablet (4 mg total) by mouth every 4 (four) hours as needed for severe pain. 120 tablet 0  . isosorbide dinitrate (ISORDIL) 30 MG tablet Take 30 mg by mouth daily.    . isosorbide mononitrate (IMDUR) 60 MG 24 hr tablet Take 1 tablet (60 mg total) by mouth daily. 90 tablet 3  . lisinopril (PRINIVIL,ZESTRIL) 20 MG tablet TAKE 1 TABLET DAILY 90 tablet 2  . Multiple Vitamin (MULTIVITAMIN) capsule Take 1 capsule by mouth every morning.     . nitroGLYCERIN (NITROSTAT) 0.4 MG SL tablet Place 1 tablet (0.4 mg total) under the tongue every 5 (five) minutes as needed for chest pain. 25 tablet 3  . polyethylene glycol (MIRALAX / GLYCOLAX) packet Take 17 g by mouth daily as needed. 14 each 0  . rOPINIRole (REQUIP) 3 MG tablet Take 1 tablet (3 mg total) by mouth at bedtime. 90 tablet 11  . sodium chloride 0.9 % SOLN 250 mL with cyclophosphamide 1 G SOLR 300 mg/m2 Inject 300 mg/m2 into the vein every 14 (fourteen) days.    . Sodium Chloride Flush 0.9 % SOLN injection Inject 10 mLs into the vein as directed. 60 Syringe 3  . tamsulosin (FLOMAX) 0.4 MG CAPS capsule Take 0.4 mg by mouth at bedtime.    . vitamin C (ASCORBIC ACID) 500  MG tablet Take 250 mg by mouth 2 (two) times daily.     . Zoledronic Acid (ZOMETA IV) Inject 1 each into the vein once.  No current facility-administered medications for this visit.   Facility-Administered Medications Ordered in Other Visits  Medication Dose Route Frequency Provider Last Rate Last Dose  . sodium chloride 0.9 % injection 3 mL  3 mL Intravenous PRN [name], MD        Objective: Older white man examined in a wheelchair Filed Vitals:   12/30/15 1241  BP: 122/44  Pulse: 83  Temp: 97.3 F (36.3 C)  Resp: 18        Body mass index is 25.1 kg/(m^2).    ECOG FS: 3 Filed Weights   12/30/15 1241  Weight: 174 lb 14.4 oz (79.334 kg)   Sclerae unicteric, EOMs intact Oropharynx clear, dentition in good repair, no thrush or other lesions No cervical or supraclavicular adenopathy Lungs no rales or rhonchi, no basilar rales Heart regular rate and rhythm Abd soft, nontender, positive bowel sounds MSK no focal spinal tenderness to moderate palpation Neuro: nonfocal, well oriented, appropriate affect   LAB RESULTS:    Ig Kappa Free Light Chain 3.30 - 19.40 mg/L 353.22 (H) 359.74 (H)    Ig Lambda Free Light Chain 5.71 - 26.30 mg/L 33.30 (H) 27.24 (H)    Kappa/Lambda FluidC Ratio 0.26 - 1.65  10.61 (H) 13.21 (H)         Lab Results  Component Value Date   WBC 8.2 12/28/2015   NEUTROABS 5.5 12/28/2015   HGB 9.3* 12/28/2015   HCT 29.2* 12/28/2015   MCV 82.7 12/28/2015   PLT 94 Large platelets present* 12/28/2015      Chemistry      Component Value Date/Time   NA 134* 12/28/2015 1010   NA 136 12/22/2015 0920   K 4.0 12/28/2015 1010   K 3.9 12/22/2015 0920   CL 106 12/22/2015 0920   CL 98 05/12/2013 1048   CO2 19* 12/28/2015 1010   CO2 19* 12/22/2015 0920   BUN 28.1* 12/28/2015 1010   BUN 20 12/22/2015 0920   CREATININE 1.6* 12/28/2015 1010   CREATININE 1.32* 12/22/2015 0920   CREATININE 1.25* 06/04/2015 0511      Component Value Date/Time     CALCIUM 8.3* 12/28/2015 1010   CALCIUM 8.9 12/22/2015 0920   ALKPHOS 201* 12/28/2015 1535   ALKPHOS 198* 12/28/2015 1010   AST 107* 12/28/2015 1535   AST 104* 12/28/2015 1010   ALT 25 12/28/2015 1535   ALT 27 12/28/2015 1010   BILITOT 0.7 12/28/2015 1535   BILITOT 0.55 12/28/2015 1010      STUDIES: Dg Chest 2 View  12/28/2015  CLINICAL DATA:  Shortness of breath worsening recently, some weakness, history of multiple myeloma EXAM: CHEST  2 VIEW COMPARISON:  Chest x-ray of 05/21/2015 FINDINGS: The images were taken with the patient in a wheelchair and inspiration is not optimal, with bibasilar linear atelectasis present. No focal infiltrate or effusion is seen. It is noted that the right PICC line extends cephalad presumably into the right internal jugular vein. Cardiomegaly is stable. Median sternotomy sutures are noted. No acute bony abnormality is seen. IMPRESSION: 1. Poor inspiration with bibasilar atelectasis. 2. Right PICC line tip extends cephalad probably into the right internal jugular vein. Electronically Signed   By: Ivar Drape M.D.   On: 12/28/2015 13:14   US Abdomen Complete  12/28/2015  CLINICAL DATA:  Dyspnea. Elevated liver function tests. History of multiple myeloma. Initial encounter. EXAM: ABDOMEN ULTRASOUND COMPLETE COMPARISON:  None. FINDINGS: Gallbladder: No gallstones or wall thickening visualized. No sonographic Murphy sign noted  by sonographer. Common bile duct: Diameter: 0.2 cm Liver: No focal lesion identified. The portal triads demonstrate increased echogenicity. There appears to be subtle nodularity of the liver border. IVC: No abnormality visualized. Pancreas: Visualized portion unremarkable. Spleen: Volume is 844.7 ml consistent splenomegaly. No focal splenic lesion. Right Kidney: Length: 11.3 cm. Cortical echogenicity appears increased. No hydronephrosis visualized. Simple 1.7 cm cyst in the midpole noted. Left Kidney: Length: 10.5 cm. Cortical echogenicity appears  increase. No mass or hydronephrosis visualized. Abdominal aorta: No aneurysm visualized. Other findings: None. IMPRESSION: Increased echogenicity of the portal triads is nonspecific but can be seen in hepatitis. Subtle nodularity of the liver border suggestive of cirrhosis. Splenomegaly. Findings compatible with medical renal disease. Electronically Signed   By: Inge Rise M.D.   On: 12/28/2015 19:09     ASSESSMENT: 82 y.o.  with kappa light chain multiple myeloma   (1) presenting April 2007 with anemia and renal failure; renal Bx showing free kappa light chain deposition; bone marrow biopsy showing 37% plasma cells, with normal cytogenetics and FISH;bone survey showing skull lytic lesions; treated with   (2) thalidomide (200 mg/d) and dexamethasone (40 mg/d x4d Q28d) June through Nov 2007, with good response, but poor tolerance;   (3) thalidomide decreased to 100 mg/d, dexamethasone continued, bortezomib (IV) added, Dec 2007 to March 2008   (4) treatment interrupted by multiple complications (peripheral neuropathy, pulmonary embolism, diverticular abscess, CN V zoster, severe DDD, congestive heart failure, rising PSA); maintenance zolendronic acid through March 2012   (5) progression April 2012, treated initially with dexamethasone alone, poorly tolerated;   (6) bortezomib (sq) resumed July 2012; cyclophosphamide and dexamethasone added Sept 2012; zolendronic acid changed to Q 44month; all treatments held as of March 2013 due to the development of the Left foot ulcer described above  (7) anemia, on aranesp December 2012 to August 2013; resumed Q14d starting May 2015, with slowly rising reticulocyte count  (8) status post Right trochanteric nail with proximal and distal interlock DePuy 11 mm one-pin lag screw, 44 mm distal interlock 08/09/2013 for a right comminuted intertrochanteric fracture  (9) status post removal of the first 2 toes left foot  (10) Left common femoral artery  endarterectomy with vein patch angioplasty using left greater saphenous vein 03/24/2014  (11) status post fall with injury to the right hip, large hematoma[facility]  (12) on 10/09/2014 starting cyclophosphamide at 300 mg/m every 14 days and dexamethasone 20 mg daily 2 days each week  (13) SIADH: On fluid restriction, reduced to 1.5L on 12/04/14 because of drop in sodium  (14) chronic systolic congestive heart failure, with EF  35% per echo, with history CAD s/p CABG 2002  (15) CKD stage III  (16) peripheral vascular disease, status post outpatient stage diamondback orbital rotational atherectomy along with PTCA and placement of Viabahn covered stent 06/03/2015 for high grade calcified prox L common illiac artery stenosis noted on cath 05/27/2015  (17) poor access: Status post right-sided PICC placement 07/06/2015  (a[facility], wife flushes PICC every Thurs and SAT  PLAN:   I spent a little over 40 minutes with DBarbarann Ehlersand his family today discussing recent developments. Myeloma is not a problem.DBarbarann Ehlersis declining in his congestive heart failure is decompensating. We are beginning to see worsening problems with breathing, a rise in his creatinine, elevated liver function tests, a drop in his platelets which probably is due to splenic sequestration secondary to the congestion in  the liver.  Cardiology has tried to make further adjustments in his medications, but they are hitting walls. Helping one thing hurts the other.  At this point I think we need to be ready for [name] continuing to deteriorate and I think if this a good time to place an in-home palliative care consult. We discussed the fact that this is a service provided by hospice, but it is not full hospice. They will not have hospice services for example after 5 PM or on weekends. Nevertheless it is an introduction to those services and it can be incredibly  helpful to the patient and the family.  Of course in-home palliative care does allow [place] to continue chemotherapy if we wish. His myeloma currently is sufficiently well-controlled that if we did not treated for the next 6 months they would be no significant change and accordingly we are stopping the Cytoxan at this point. I have stopped all his in essential medications. I have left his cardiac medications as prescribed by Dr. Martinique. I am adding Remeron to be taking at that time and I'm hopeful this will help not only mood but also his appetite. I am stopping Decadron 20 mg twice a week and instead we will be doing Decadron 4 mg every morning with breakfast.  I am hopeful with these few changes [name] will start feeling a little better.  I also gave him a prescription for a wheelchair since he is high fall risk at this point.  We did discuss advanced directives today. His wife [name] is his healthcare power of attorney. He is very clear that in case of a terminal event he does not want life support or resuscitation. This is an appropriate decision on his part.  He will see me again on femora 22nd 2017. They know to call for any problems that may develop before then. MAGRINAT,GUSTAV C    12/30/2015

## 2015-12-31 ENCOUNTER — Telehealth: Payer: Self-pay

## 2015-12-31 ENCOUNTER — Other Ambulatory Visit: Payer: Self-pay | Admitting: Oncology

## 2015-12-31 DIAGNOSIS — I5023 Acute on chronic systolic (congestive) heart failure: Secondary | ICD-10-CM

## 2015-12-31 MED ORDER — NYSTATIN 100000 UNIT/ML MT SUSP: 5.0000 mL | Freq: Four times a day (QID) | OROMUCOSAL | Status: AC

## 2015-12-31 NOTE — Telephone Encounter (Signed)
Mrs. Aram Candela called requesting IVF for pt - he is unable to eat due to extreme pain with swallowing.  Per Dr. [name], he does not want to give IVF due to pt heart condition; order nystatin qid swish and swallow.  Rx ordered, Mrs. Talley notified and advised to go to ED if condition worsend.  Writer also advised pt can try pain meds prior to eating.  She voiced understanding.

## 2016-01-01 ENCOUNTER — Encounter (HOSPITAL_COMMUNITY): Payer: Self-pay | Admitting: Emergency Medicine

## 2016-01-01 ENCOUNTER — Telehealth: Payer: Self-pay | Admitting: Oncology

## 2016-01-01 ENCOUNTER — Inpatient Hospital Stay (HOSPITAL_COMMUNITY)
Admission: EM | Admit: 2016-01-01 | Discharge: 2016-01-05 | DRG: 369 | Disposition: A | Discharge status: Home Health | Payer: Medicare Other | Attending: Internal Medicine | Admitting: Internal Medicine

## 2016-01-01 DIAGNOSIS — Z79891 Long term (current) use of opiate analgesic: Secondary | ICD-10-CM

## 2016-01-01 DIAGNOSIS — Z951 Presence of aortocoronary bypass graft: Secondary | ICD-10-CM

## 2016-01-01 DIAGNOSIS — I5022 Chronic systolic (congestive) heart failure: Secondary | ICD-10-CM | POA: Diagnosis present

## 2016-01-01 DIAGNOSIS — N183 Chronic kidney disease, stage 3 (moderate): Secondary | ICD-10-CM | POA: Diagnosis present

## 2016-01-01 DIAGNOSIS — Z66 Do not resuscitate: Secondary | ICD-10-CM | POA: Diagnosis present

## 2016-01-01 DIAGNOSIS — E872 Acidosis: Secondary | ICD-10-CM | POA: Diagnosis present

## 2016-01-01 DIAGNOSIS — Z885 Allergy status to narcotic agent status: Secondary | ICD-10-CM

## 2016-01-01 DIAGNOSIS — Z961 Presence of intraocular lens: Secondary | ICD-10-CM | POA: Diagnosis present

## 2016-01-01 DIAGNOSIS — R4701 Aphasia: Secondary | ICD-10-CM | POA: Diagnosis present

## 2016-01-01 DIAGNOSIS — C9000 Multiple myeloma not having achieved remission: Secondary | ICD-10-CM | POA: Diagnosis present

## 2016-01-01 DIAGNOSIS — Z79899 Other long term (current) drug therapy: Secondary | ICD-10-CM

## 2016-01-01 DIAGNOSIS — Z85828 Personal history of other malignant neoplasm of skin: Secondary | ICD-10-CM | POA: Diagnosis not present

## 2016-01-01 DIAGNOSIS — Z89422 Acquired absence of other left toe(s): Secondary | ICD-10-CM | POA: Diagnosis not present

## 2016-01-01 DIAGNOSIS — G629 Polyneuropathy, unspecified: Secondary | ICD-10-CM | POA: Diagnosis present

## 2016-01-01 DIAGNOSIS — Z7982 Long term (current) use of aspirin: Secondary | ICD-10-CM | POA: Diagnosis not present

## 2016-01-01 DIAGNOSIS — Z515 Encounter for palliative care: Secondary | ICD-10-CM | POA: Diagnosis present

## 2016-01-01 DIAGNOSIS — R339 Retention of urine, unspecified: Secondary | ICD-10-CM | POA: Diagnosis present

## 2016-01-01 DIAGNOSIS — Z86711 Personal history of pulmonary embolism: Secondary | ICD-10-CM | POA: Diagnosis not present

## 2016-01-01 DIAGNOSIS — E785 Hyperlipidemia, unspecified: Secondary | ICD-10-CM | POA: Diagnosis present

## 2016-01-01 DIAGNOSIS — E860 Dehydration: Secondary | ICD-10-CM | POA: Diagnosis present

## 2016-01-01 DIAGNOSIS — Z8619 Personal history of other infectious and parasitic diseases: Secondary | ICD-10-CM

## 2016-01-01 DIAGNOSIS — I130 Hypertensive heart and chronic kidney disease with heart failure and stage 1 through stage 4 chronic kidney disease, or unspecified chronic kidney disease: Secondary | ICD-10-CM | POA: Diagnosis present

## 2016-01-01 DIAGNOSIS — I252 Old myocardial infarction: Secondary | ICD-10-CM | POA: Diagnosis not present

## 2016-01-01 DIAGNOSIS — Z89429 Acquired absence of other toe(s), unspecified side: Secondary | ICD-10-CM

## 2016-01-01 DIAGNOSIS — Z8249 Family history of ischemic heart disease and other diseases of the circulatory system: Secondary | ICD-10-CM

## 2016-01-01 DIAGNOSIS — I2510 Atherosclerotic heart disease of native coronary artery without angina pectoris: Secondary | ICD-10-CM | POA: Diagnosis present

## 2016-01-01 DIAGNOSIS — I255 Ischemic cardiomyopathy: Secondary | ICD-10-CM | POA: Diagnosis present

## 2016-01-01 DIAGNOSIS — D638 Anemia in other chronic diseases classified elsewhere: Secondary | ICD-10-CM | POA: Diagnosis present

## 2016-01-01 DIAGNOSIS — N179 Acute kidney failure, unspecified: Secondary | ICD-10-CM | POA: Diagnosis present

## 2016-01-01 DIAGNOSIS — Z9841 Cataract extraction status, right eye: Secondary | ICD-10-CM | POA: Diagnosis not present

## 2016-01-01 DIAGNOSIS — I739 Peripheral vascular disease, unspecified: Secondary | ICD-10-CM | POA: Diagnosis present

## 2016-01-01 DIAGNOSIS — Z7952 Long term (current) use of systemic steroids: Secondary | ICD-10-CM | POA: Diagnosis not present

## 2016-01-01 DIAGNOSIS — B3781 Candidal esophagitis: Principal | ICD-10-CM | POA: Diagnosis present

## 2016-01-01 DIAGNOSIS — R1319 Other dysphagia: Secondary | ICD-10-CM | POA: Diagnosis present

## 2016-01-01 DIAGNOSIS — M109 Gout, unspecified: Secondary | ICD-10-CM | POA: Diagnosis present

## 2016-01-01 DIAGNOSIS — R1310 Dysphagia, unspecified: Secondary | ICD-10-CM | POA: Diagnosis present

## 2016-01-01 DIAGNOSIS — E876 Hypokalemia: Secondary | ICD-10-CM | POA: Diagnosis present

## 2016-01-01 DIAGNOSIS — Z87891 Personal history of nicotine dependence: Secondary | ICD-10-CM

## 2016-01-01 DIAGNOSIS — I10 Essential (primary) hypertension: Secondary | ICD-10-CM

## 2016-01-01 DIAGNOSIS — N189 Chronic kidney disease, unspecified: Secondary | ICD-10-CM | POA: Diagnosis present

## 2016-01-01 DIAGNOSIS — Z9842 Cataract extraction status, left eye: Secondary | ICD-10-CM | POA: Diagnosis not present

## 2016-01-01 DIAGNOSIS — C9001 Multiple myeloma in remission: Secondary | ICD-10-CM

## 2016-01-01 DIAGNOSIS — M545 Low back pain: Secondary | ICD-10-CM | POA: Diagnosis present

## 2016-01-01 DIAGNOSIS — F419 Anxiety disorder, unspecified: Secondary | ICD-10-CM | POA: Diagnosis present

## 2016-01-01 LAB — COMPREHENSIVE METABOLIC PANEL
ALBUMIN: 3 g/dL — AB (ref 3.5–5.0)
ALK PHOS: 303 U/L — AB (ref 38–126)
ALT: 30 U/L (ref 17–63)
ANION GAP: 18 — AB (ref 5–15)
AST: 109 U/L — ABNORMAL HIGH (ref 15–41)
BUN: 83 mg/dL — ABNORMAL HIGH (ref 6–20)
CALCIUM: 8.2 mg/dL — AB (ref 8.9–10.3)
CO2: 13 mmol/L — AB (ref 22–32)
Chloride: 110 mmol/L (ref 101–111)
Creatinine, Ser: 2.88 mg/dL — ABNORMAL HIGH (ref 0.61–1.24)
GFR calc non Af Amer: 18 mL/min — ABNORMAL LOW (ref >60)
GFR, EST AFRICAN AMERICAN: 21 mL/min — AB (ref >60)
Glucose, Bld: 120 mg/dL — ABNORMAL HIGH (ref 65–99)
Potassium: 4.1 mmol/L (ref 3.5–5.1)
Sodium: 141 mmol/L (ref 135–145)
Total Bilirubin: 0.8 mg/dL (ref 0.3–1.2)
Total Protein: 7.6 g/dL (ref 6.5–8.1)

## 2016-01-01 LAB — CBC WITH DIFFERENTIAL/PLATELET
BASOS PCT: 1 %
Basophils Absolute: 0.1 10*3/uL (ref 0.0–0.1)
EOS PCT: 0 %
Eosinophils Absolute: 0 10*3/uL (ref 0.0–0.7)
HEMATOCRIT: 28.9 % — AB (ref 39.0–52.0)
Hemoglobin: 9.3 g/dL — ABNORMAL LOW (ref 13.0–17.0)
LYMPHS PCT: 12 %
Lymphs Abs: 1.7 10*3/uL (ref 0.7–4.0)
MCH: 26.8 pg (ref 26.0–34.0)
MCHC: 32.2 g/dL (ref 30.0–36.0)
MCV: 83.3 fL (ref 78.0–100.0)
MONOS PCT: 5 %
Monocytes Absolute: 0.7 10*3/uL (ref 0.1–1.0)
NEUTROS PCT: 82 %
Neutro Abs: 11.5 10*3/uL — ABNORMAL HIGH (ref 1.7–7.7)
Platelets: 102 10*3/uL — ABNORMAL LOW (ref 150–400)
RBC: 3.47 MIL/uL — AB (ref 4.22–5.81)
RDW: 20.7 % — AB (ref 11.5–15.5)
WBC: 14 10*3/uL — AB (ref 4.0–10.5)

## 2016-01-01 LAB — URINE MICROSCOPIC-ADD ON
RBC / HPF: NONE SEEN RBC/hpf (ref 0–5)
Squamous Epithelial / LPF: NONE SEEN

## 2016-01-01 LAB — URINALYSIS, ROUTINE W REFLEX MICROSCOPIC
Bilirubin Urine: NEGATIVE
Glucose, UA: NEGATIVE mg/dL
Ketones, ur: NEGATIVE mg/dL
LEUKOCYTES UA: NEGATIVE
NITRITE: NEGATIVE
PH: 5 (ref 5.0–8.0)
PROTEIN: 100 mg/dL — AB
Specific Gravity, Urine: 1.019 (ref 1.005–1.030)

## 2016-01-01 MED ORDER — FLUCONAZOLE IN SODIUM CHLORIDE 200-0.9 MG/100ML-% IV SOLN
200.0000 mg | INTRAVENOUS | Status: DC
  Administered 2016-01-02 – 2016-01-03 (×2): 200 mg via INTRAVENOUS
  Filled 2016-01-01 (×3): qty 100

## 2016-01-01 MED ORDER — SODIUM CHLORIDE 0.9 % IV SOLN
INTRAVENOUS | Status: DC
  Administered 2016-01-01: 75 mL/h via INTRAVENOUS

## 2016-01-01 MED ORDER — SODIUM CHLORIDE 0.9 % IV BOLUS (SEPSIS)
250.0000 mL | Freq: Once | INTRAVENOUS | Status: AC
  Administered 2016-01-01: 250 mL via INTRAVENOUS

## 2016-01-01 MED ORDER — ACETAMINOPHEN 325 MG PO TABS: 650.0000 mg | ORAL_TABLET | Freq: Four times a day (QID) | ORAL | Status: DC | PRN

## 2016-01-01 MED ORDER — METOPROLOL TARTRATE 1 MG/ML IV SOLN
2.5000 mg | Freq: Four times a day (QID) | INTRAVENOUS | Status: DC
  Administered 2016-01-02 – 2016-01-04 (×10): 2.5 mg via INTRAVENOUS
  Filled 2016-01-01 (×11): qty 5

## 2016-01-01 MED ORDER — HYDROMORPHONE HCL 1 MG/ML IJ SOLN
0.5000 mg | INTRAMUSCULAR | Status: DC | PRN
  Administered 2016-01-02 (×3): 0.5 mg via INTRAVENOUS
  Filled 2016-01-01 (×3): qty 1

## 2016-01-01 MED ORDER — MIRTAZAPINE 15 MG PO TABS
15.0000 mg | ORAL_TABLET | Freq: Every day | ORAL | Status: DC
  Administered 2016-01-01 – 2016-01-04 (×4): 15 mg via ORAL
  Filled 2016-01-01 (×4): qty 1

## 2016-01-01 MED ORDER — DEXTROSE-NACL 5-0.45 % IV SOLN
INTRAVENOUS | Status: DC
  Administered 2016-01-01 – 2016-01-03 (×3): via INTRAVENOUS

## 2016-01-01 MED ORDER — ACETAMINOPHEN 650 MG RE SUPP
650.0000 mg | Freq: Four times a day (QID) | RECTAL | Status: DC | PRN
Start: 2016-01-01 — End: 2016-01-05

## 2016-01-01 MED ORDER — ONDANSETRON HCL 4 MG/2ML IJ SOLN: 4.0000 mg | Freq: Four times a day (QID) | INTRAMUSCULAR | Status: DC | PRN

## 2016-01-01 MED ORDER — ONDANSETRON HCL 4 MG PO TABS: 4.0000 mg | ORAL_TABLET | Freq: Four times a day (QID) | ORAL | Status: DC | PRN

## 2016-01-01 MED ORDER — HYDROCODONE-ACETAMINOPHEN 5-325 MG PO TABS: 1.0000 | ORAL_TABLET | ORAL | Status: DC | PRN

## 2016-01-01 MED ORDER — FLUCONAZOLE IN SODIUM CHLORIDE 400-0.9 MG/200ML-% IV SOLN
400.0000 mg | Freq: Once | INTRAVENOUS | Status: AC
  Administered 2016-01-01: 400 mg via INTRAVENOUS
  Filled 2016-01-01: qty 200

## 2016-01-01 NOTE — H&P (Addendum)
PCP: [name], MD  Cardiology [facility]  Referring provider Alen   Chief Complaint:  Trouble eating HPI: Timothy [name] is a 82 y.o. male   has a past medical history of ASCVD (arteriosclerotic cardiovascular disease); Gout; Hypertension; Lumbar disc disease; History of echocardiogram (11/02/2010); CHF (congestive heart failure) ([place]); Heart murmur; Ischemic cardiomyopathy; Renal insufficiency; Peripheral neuropathy ([place]); Hyperlipidemia; SOB (shortness of breath); Fatigue; Peripheral neuropathy ([place]); Coronary artery disease; Blood transfusion (10/2011; 07/2013); History of shingles (MARCH 2009); Osteomyelitis ([place]); Chronic systolic heart failure ([place]); Protein malnutrition ([place]); Critical lower limb ischemia; Peripheral vascular disease ([place]); cardiovascular stress test; Inferior myocardial infarction ([place]) (1986); Pulmonary embolism ([place]); Chronic anemia; Chronic lower back pain; Multiple myeloma ([place]) (dx'd 04/2006); and Basal cell carcinoma.   Presented with worsening overall performance status and odynophagia for 3 days.  Patient started to have trouble ambulating have had increased unsteadiness. He has had decreased by mouth intake no appetite he has had some episodes of spitting up and difficulty with swallowing. He has had significant hand when he tries to swallow anything yesterday he got a prescription for nystatin swish and swallow . He has lost some weight about 10 pounds in the past month Patient reports low back pain. Reports some shortness of breath increased with exertion. No leg swelling. He has not had any nausea or vomiting or fever.  IN ER: He was found to have acute on chronic renal failure with creatinine up to 2.88 and white blood cell count elevation at 14 also evidence of metabolic acidosis with bicarbonate of 13   Regarding pertinent past history: Patient has hx of multiple myeloma diagnosed since 8938 complicated by anemia, in chronic renal insufficiency.  He also has history of ischemic cardiomyopathy last echogram was eating degenerative thousand 16 showing EF of 35% followed by [facility]. Patient had had a lot of problems with adjusting of his medications have been progressively doing worse. He was seen by his oncologist to discussed end-of-life issues and recommended in-home palliative care consult. He started patient on Remeron to help with appetite as well as Decadron 4 mg every day He has known history of coronary artery disease status post CABG in 2002 The past patient has been diagnosed with SIADH and hasn't had to be in the past on fluid restriction diet. Has chronic kidney disease with baseline at 1.6  Hospitalist was called for admission for dehydration likely secondary to candidal esophagitis   Review of Systems:    Pertinent positives include: odynophagia, fatigue, weight loss  shortness of breath at rest. dyspnea on exertion,   Constitutional:  No weight loss, night sweats, Fevers, chills,  HEENT:  No headaches, Difficulty swallowing,Tooth/dental problems,Sore throat,  No sneezing, itching, ear ache, nasal congestion, post nasal drip,  Cardio-vascular:  No chest pain, Orthopnea, PND, anasarca, dizziness, palpitations.no Bilateral lower extremity swelling  GI:  No heartburn, indigestion, abdominal pain, nausea, vomiting, diarrhea, change in bowel habits, loss of appetite, melena, blood in stool, hematemesis Resp:  No excess mucus, no productive cough, No non-productive cough, No coughing up of blood.No change in color of mucus. No wheezing. Skin:  no rash or lesions. No jaundice GU:  no dysuria, change in color of urine, no urgency or frequency. No straining to urinate.  No flank pain.  Musculoskeletal:  No joint pain or no joint swelling. No decreased range of motion. No back pain.  Psych:  No change in mood or affect. No depression or anxiety. No memory loss.  Neuro: no  localizing neurological complaints, no tingling, no  weakness, no double vision, no gait abnormality, no slurred speech, no confusion  Otherwise ROS are negative except for above, 10 systems were reviewed  Past Medical History: Past Medical History  Diagnosis Date  . ASCVD (arteriosclerotic cardiovascular disease)   . Gout   . Hypertension   . Lumbar disc disease     post laminectomy  . History of echocardiogram 11/02/2010    EF range of 30 to 35% / There is hypokinsesis of the basal-mild inferolateral myocardium  . CHF (congestive heart failure) ([place])   . Heart murmur   . Ischemic cardiomyopathy   . Renal insufficiency   . Peripheral neuropathy ([place])   . Hyperlipidemia   . SOB (shortness of breath)   . Fatigue   . Peripheral neuropathy ([place])   . Coronary artery disease     CABG 2002 by Dr. Roxy Manns with LIMA to LAD, SVG to intermediate, SVG to LCX, & SVG to PL. Last nuclear in 2012 showing large inferior scar with EF of 39%.   . Blood transfusion 10/2011; 07/2013    "multiple myeloma; when I broke my leg"  . History of shingles MARCH 2009    SHINGLE LESIONS WERE AROUND RIGHT EYE--PT HAS RESIDUAL ITCHING AROUND THE EYE.  . Osteomyelitis ([place])     s/p toe amputation  . Chronic systolic heart failure (HCC)     EF of 30% per echo 02/2012  . Protein malnutrition ([place])   . Critical lower limb ischemia   . Peripheral vascular disease ([place])   . Hx of cardiovascular stress test     Lexiscan Myoview (1/16):  Large inferior and apical scar; no ischemia, EF 27%;  High Risk  . Inferior myocardial infarction ([place]) 1986  . Pulmonary embolism ([place])     "when I had big toe amputated"  . Chronic anemia     "due to the multiple myeloma"  . Chronic lower back pain   . Multiple myeloma ([place]) dx'd 04/2006    with recurrence of increasing problems  DR. [place] -ONCOLOGIST.  PT HAS BEEN OFF CHEMO SINCE HIS HOSPITALIZATION FEB 2013 FOR LEFT FOOT INFECTION  . Basal cell carcinoma     "chest left arm"   Past Surgical History  Procedure Laterality  Date  . Lumbar laminectomy    . Cardiac catheterization  06/20/2000    Severe coronary disease (totally occluded right artery, 90% left circumflex, 50-60% intermediate, and 90-95% ostial left anterior descending)   . Coronary artery bypass graft  2002    x4 / Left internal mammary artery to the LAD.  / Saphenous vein graft to the right posterolateral.  /  Saphenous vein graft to  the ramus intermedius. / Saphenous vein graft to the circumflex marginal     . Amputation  03/06/2012    Procedure: AMPUTATION RAY;  Surgeon: Alta Corning, MD;  Location: [facility];  Service: Orthopedics;  Laterality: Left;  First Ray Amputation  . Intramedullary (im) nail intertrochanteric Right 08/17/2013    Procedure: INTRAMEDULLARY (IM) NAIL INTERTROCHANTRIC;  Surgeon: Marybelle Killings, MD;  Location: [facility];  Service: Orthopedics;  Laterality: Right;  . Endarterectomy femoral Left 03/24/2014    Procedure: Left Common Femoral  Artery Endarterectomy with Vein Patch Angioplasty,  Ultrasound ;  Surgeon: Angelia Mould, MD;  Location: [facility];  Service: Vascular;  Laterality: Left;  . Cataract extraction w/ intraocular lens  implant, bilateral Bilateral   . Lower extremity angiogram N/A 03/23/2014  Procedure: LOWER EXTREMITY ANGIOGRAM;  Surgeon: Lorretta Harp, MD;  Location: [facility] CATH LAB;  Service: Cardiovascular;  Laterality: N/A;  . Peripheral vascular catheterization N/A 05/27/2015    Procedure: Lower Extremity Angiography;  Surgeon: Lorretta Harp, MD;  Location: [place] CV LAB;  Service: Cardiovascular;  Laterality: N/A;  . Peripheral vascular catheterization  06/03/2015    Procedure: Peripheral Vascular Intervention;  Surgeon: Lorretta Harp, MD;  Location: [place] CV LAB;  Service: Cardiovascular;;  Lt Common Iliac  . Tonsillectomy  ~ 1936  . Inguinal hernia repair Bilateral   . Fracture surgery    . Back surgery    . Basal cell carcinoma excision      "chest left arm"  . Toe amputation Left ~ 2014      Medications: Prior to Admission medications   Medication Sig Start Date End Date Taking? Authorizing Provider  aspirin 81 MG chewable tablet Chew 1 tablet (81 mg total) by mouth daily. 06/04/15  Yes Almyra Deforest, PA  carvedilol (COREG) 6.25 MG tablet TAKE 1 TABLET TWICE A DAY WITH MEALS 09/01/15  Yes Burtis Junes, NP  dexamethasone (DECADRON) 4 MG tablet Take 1 tablet (4 mg total) by mouth 2 (two) times daily with a meal. Patient taking differently: Take 4 mg by mouth daily.  12/30/15  Yes [name], MD  Heparin Lock Flush (HEPARIN FLUSH, PORCINE,) 100 UNIT/ML injection 2.16m ( 250 ) per picc line flush. Use 1 syringe per port. 07/06/15  Yes GChauncey Cruel MD  HYDROmorphone (DILAUDID) 4 MG tablet Take 1 tablet (4 mg total) by mouth every 4 (four) hours as needed for severe pain. 12/20/15  Yes GChauncey Cruel MD  Ibuprofen-Diphenhydramine HCl (ADVIL PM) 200-25 MG CAPS Take 2 capsules by mouth at bedtime as needed (sleep).   Yes Historical Provider, MD  isosorbide mononitrate (IMDUR) 30 MG 24 hr tablet Take 30 mg by mouth at bedtime.  12/30/15  Yes GChauncey Cruel MD  lisinopril (PRINIVIL,ZESTRIL) 20 MG tablet TAKE 1 TABLET DAILY 04/23/15  Yes Peter M JMartinique MD  mirtazapine (REMERON) 15 MG tablet Take 1 tablet (15 mg total) by mouth at bedtime. 12/30/15  Yes GChauncey Cruel MD  mirtazapine (REMERON) 15 MG tablet Take 15 mg by mouth at bedtime.   Yes Historical Provider, MD  Multiple Vitamin (MULTIVITAMIN) capsule Take 1 capsule by mouth every morning.    Yes Historical Provider, MD  nitroGLYCERIN (NITROSTAT) 0.4 MG SL tablet Place 1 tablet (0.4 mg total) under the tongue every 5 (five) minutes as needed for chest pain. 09/03/14  Yes LBurtis Junes NP  nystatin (MYCOSTATIN) 100000 UNIT/ML suspension Take 5 mLs (500,000 Units total) by mouth 4 (four) times daily. 12/31/15  Yes GChauncey Cruel MD  rOPINIRole (REQUIP) 3 MG tablet Take 1 tablet (3 mg total) by mouth at bedtime.  10/11/15  Yes MTrula Slade DPM  sodium chloride 0.9 % SOLN 250 mL with cyclophosphamide 1 G SOLR 300 mg/m2 Inject 300 mg/m2 into the vein every 21 ( twenty-one) days.    Yes Historical Provider, MD  Sodium Chloride Flush 0.9 % SOLN injection Inject 10 mLs into the vein as directed. 11/09/15  Yes GChauncey Cruel MD  tamsulosin (FLOMAX) 0.4 MG CAPS capsule Take 0.4 mg by mouth 2 (two) times daily.    Yes Historical Provider, MD  vitamin C (ASCORBIC ACID) 500 MG tablet Take 250 mg by mouth 2 (two) times daily.    Yes  Historical Provider, MD  B Complex-Biotin-FA (B COMPLETE PO) Take 1 tablet by mouth daily.     Historical Provider, MD  darbepoetin (ARANESP) 200 MCG/0.4ML SOLN injection Inject 200 mcg into the skin as directed.    Historical Provider, MD  diphenoxylate-atropine (LOMOTIL) 2.5-0.025 MG tablet Take 1 tablet by mouth 4 (four) times daily as needed for diarrhea or loose stools. 09/27/15   [name], MD  furosemide (LASIX) 40 MG tablet TAKE 1 TABLET DAILY Patient not taking: Reported on 01/01/2016 10/11/15   Peter M [facility], MD  polyethylene glycol [facility] Floria Raveling) packet Take 17 g by mouth daily as needed. 09/05/13   Bary Leriche, PA-C    Allergies:   Allergies  Allergen Reactions  . Morphine And Related Other (See Comments)    Agitated with morphine drip    Social History:  Ambulatory  walker   Lives at home With family wife [name]     reports that he quit smoking about 31 years ago. His smoking use included Cigarettes. He has a 20 pack-year smoking history. He has never used smokeless tobacco. He reports that he drinks alcohol. He reports that he does not use illicit drugs.     Family History: family history includes Heart attack in his mother; Heart disease in his father.    Physical Exam: Patient Vitals for the past 24 hrs:  BP Temp Temp src Pulse Resp SpO2  01/01/16 1830 102/58 mmHg - - 85 - 93 %  01/01/16 1800 (!) 101/50 mmHg - - - - -  01/01/16  1754 120/72 mmHg 98 F (36.7 C) Oral 82 18 94 %  01/01/16 1730 117/57 mmHg - - - - -  01/01/16 1700 118/70 mmHg - - 88 - 92 %  01/01/16 1630 110/65 mmHg - - 86 - 93 %  01/01/16 1600 126/60 mmHg - - 104 - (!) 88 %  01/01/16 1530 165/93 mmHg - - 87 - 91 %  01/01/16 1449 126/64 mmHg 97.7 F (36.5 C) Oral 89 18 96 %    1. General:  in No Acute distress 2. Psychological: Alert and Oriented 3. Head/ENT:    Dry Mucous Membranes                          Head Non traumatic, neck supple                          Norma  Dentition                          Mucus membranes - no thrush noted 4. SKIN:  decreased Skin turgor,  Skin clean Dry and intact no rash 5. Heart: Regular rate and rhythm no Murmur, Rub or gallop 6. Lungs: Clear to auscultation bilaterally, no wheezes or crackles   7. Abdomen: Soft, non-tender, Non distended 8. Lower extremities: no clubbing, cyanosis, or edema 9. Neurologically Grossly intact, moving all 4 extremities equally 10. MSK: Normal range of motion  body mass index is unknown because there is no weight on file.   Labs on Admission:   Results for orders placed or performed during the hospital encounter of 01/01/16 (from the past 24 hour(s))  CBC with Differential/Platelet     Status: Abnormal   Collection Time: 01/01/16  4:26 PM  Result Value Ref Range   WBC 14.0 (H) 4.0 - 10.5 K/uL  RBC 3.47 (L) 4.22 - 5.81 MIL/uL   Hemoglobin 9.3 (L) 13.0 - 17.0 g/dL   HCT 28.9 (L) 39.0 - 52.0 %   MCV 83.3 78.0 - 100.0 fL   MCH 26.8 26.0 - 34.0 pg   MCHC 32.2 30.0 - 36.0 g/dL   RDW 20.7 (H) 11.5 - 15.5 %   Platelets 102 (L) 150 - 400 K/uL   Neutrophils Relative % 82 %   Lymphocytes Relative 12 %   Monocytes Relative 5 %   Eosinophils Relative 0 %   Basophils Relative 1 %   Neutro Abs 11.5 (H) 1.7 - 7.7 K/uL   Lymphs Abs 1.7 0.7 - 4.0 K/uL   Monocytes Absolute 0.7 0.1 - 1.0 K/uL   Eosinophils Absolute 0.0 0.0 - 0.7 K/uL   Basophils Absolute 0.1 0.0 - 0.1 K/uL   RBC  Morphology RARE NRBCs    WBC Morphology MILD LEFT SHIFT (1-5% METAS, OCC MYELO, OCC BANDS)   Comprehensive metabolic panel     Status: Abnormal   Collection Time: 01/01/16  4:26 PM  Result Value Ref Range   Sodium 141 135 - 145 mmol/L   Potassium 4.1 3.5 - 5.1 mmol/L   Chloride 110 101 - 111 mmol/L   CO2 13 (L) 22 - 32 mmol/L   Glucose, Bld 120 (H) 65 - 99 mg/dL   BUN 83 (H) 6 - 20 mg/dL   Creatinine, Ser 2.88 (H) 0.61 - 1.24 mg/dL   Calcium 8.2 (L) 8.9 - 10.3 mg/dL   Total Protein 7.6 6.5 - 8.1 g/dL   Albumin 3.0 (L) 3.5 - 5.0 g/dL   AST 109 (H) 15 - 41 U/L   ALT 30 17 - 63 U/L   Alkaline Phosphatase 303 (H) 38 - 126 U/L   Total Bilirubin 0.8 0.3 - 1.2 mg/dL   GFR calc non Af Amer 18 (L) >60 mL/min   GFR calc Af Amer 21 (L) >60 mL/min   Anion gap 18 (H) 5 - 15   *Note: Due to a large number of results and/or encounters for the requested time period, some results have not been displayed. A complete set of results can be found in Results Review.    UA many bacteria some granular casts present negative nitrite, urine culture pending  No results found for: HGBA1C  Estimated Creatinine Clearance: 19.4 mL/min (by C-G formula based on Cr of 2.88).  BNP (last 3 results) No results for input(s): PROBNP in the last 8760 hours.  Other results:  I have pearsonaly reviewed this: ECG REPORT not obtained There were no vitals filed for this visit.   Cultures:    Component Value Date/Time   SDES URINE, RANDOM 08/21/2013 1833   SPECREQUEST NONE 08/21/2013 1833   CULT NO GROWTH Performed at [facility] 08/21/2013 1833   REPTSTATUS 08/22/2013 FINAL 08/21/2013 1833     Radiological Exams on Admission: No results found.  Chart has been reviewed  Family   at  Bedside  plan of care was discussed with  Wife Barrbara  Assessment/Plan  61 year old gentleman history him on multiple myeloma, ischemic cardiomyopathy with EF 35%, chronic anemia and chronic renal insufficiency  presents with severe or dying aphasia in the setting of recent initiation of steroids partially treated with nystatin for presumed thrush being admitted for severe dehydration and inability to tolerate anything by mouth  Present on Admission:  . Candidal esophagitis (HCC) resulting in severe odynophagia presumed candidal esophagitis will treat with fluconazole IV, discussed  with [facility] who will see in consult tomorrow for possible endoscopy. Keep NPO . Dehydration admit administer IV fluids given that patient unable to tolerate any by mouth make sure to add glucose to IV fluids  . Multiple myeloma ([place]) - as per oncology note has been fairly stable. Would need to let Dr. [name] know that patient has been admitted . Hypertension chronic unable to tolerate medications secondary to pain as swallowing. We'll continue beta blocker IV withholding parameters    . Anemia of chronic disease stable continue to monitor current asymptomatic  . Chronic systolic CHF (congestive heart failure), NYHA class 2 (HCC) avoid fluid overload but currently appears to be dehydrated we'll continue to hold Lasix  . Acute on chronic renal failure (HCC) hold lisinopril. Likely secondary to dehydration. Check orthostatics. Administer IV fluids. Check urine electrolytes   bacteria in urine - no evidence of UTI clinically will hold off on antibiotics for now and await results of urine culture   Prophylaxis: SCD    CODE STATUS:   DNR/DNI as per patient   Disposition:    likely will need placement for rehabilitation                         Other plan as per orders.  I have spent a total of 67 min on this admission   extra time was spent to discuss case Dr. Augustin Schooling 01/01/2016, 8:12 PM    [facility]  Pager [phone]   after 2 AM please page floor coverage PA If 7AM-7PM, please contact the day team taking care of the patient  [email or web address]  Password [email or web address]

## 2016-01-01 NOTE — ED Notes (Addendum)
Pt doesn't feel able to give UA.  (3rd time asking)

## 2016-01-01 NOTE — Progress Notes (Signed)
Pharmacy Antibiotic Note  Timothy [name] is a 82 y.o. male admitted on 01/01/2016 with candidal esophagitis.  Pharmacy has been consulted for fluconazole dosing.  Plan: Fluconazole 400mg  IV x 1 followed by 200mg  IV q24h  Height: 5\' 10"  (177.8 cm) Weight: 175 lb (79.379 kg) IBW/kg (Calculated) : 73  Temp (24hrs), Avg:97.9 F (36.6 C), Min:97.7 F (36.5 C), Max:98 F (36.7 C)   Recent Labs Lab 12/28/15 1009 12/28/15 1010 01/01/16 1626  WBC 8.2  --  14.0*  CREATININE  --  1.6* 2.88*    Estimated Creatinine Clearance: 19.4 mL/min (by C-G formula based on Cr of 2.88).    Allergies  Allergen Reactions  . Morphine And Related Other (See Comments)    Agitated with morphine drip    Antimicrobials this admission: 02/11 >> Fluconazole >>   Dose adjustments this admission:   Microbiology results: 2/11 UCx:   Thank you for allowing pharmacy to be a part of this patient's care.  Everette Rank, PharmD 01/01/2016 10:07 PM

## 2016-01-01 NOTE — ED Notes (Signed)
Pt attempting to obtain urine specimen 

## 2016-01-01 NOTE — ED Notes (Signed)
Pt did not want nurse to access PICC  And requested peripheral IV in hand.

## 2016-01-01 NOTE — ED Provider Notes (Signed)
CSN: [id number]     Arrival date & time 01/01/16  1436 History   First MD Initiated Contact with Patient 01/01/16 1517     Chief Complaint  Patient presents with  . Sore Throat     (Consider location/radiation/quality/duration/timing/severity/associated sxs/prior Treatment) HPI Comments: Pt here with trouble swallowing x 3 days--recently started on nystatin for what is likely candidal esophagitis Pt being tx for multiple myeloma and last chemo was last month Unable to swallowing liquids and solids No emesis or fever Sx have been getting worse, no abd pain Called his pcp and told to come to ED  Patient is a 82 y.o. male presenting with pharyngitis. The history is provided by the patient and a relative.  Sore Throat    Past Medical History  Diagnosis Date  . ASCVD (arteriosclerotic cardiovascular disease)   . Gout   . Hypertension   . Lumbar disc disease     post laminectomy  . History of echocardiogram 11/02/2010    EF range of 30 to 35% / There is hypokinsesis of the basal-mild inferolateral myocardium  . CHF (congestive heart failure) ([place])   . Heart murmur   . Ischemic cardiomyopathy   . Renal insufficiency   . Peripheral neuropathy ([place])   . Hyperlipidemia   . SOB (shortness of breath)   . Fatigue   . Peripheral neuropathy ([place])   . Coronary artery disease     CABG 2002 by Dr. Roxy Manns with LIMA to LAD, SVG to intermediate, SVG to LCX, & SVG to PL. Last nuclear in 2012 showing large inferior scar with EF of 39%.   . Blood transfusion 10/2011; 07/2013    "multiple myeloma; when I broke my leg"  . History of shingles MARCH 2009    SHINGLE LESIONS WERE AROUND RIGHT EYE--PT HAS RESIDUAL ITCHING AROUND THE EYE.  . Osteomyelitis ([place])     s/p toe amputation  . Chronic systolic heart failure (HCC)     EF of 30% per echo 02/2012  . Protein malnutrition ([place])   . Critical lower limb ischemia   . Peripheral vascular disease ([place])   . Hx of cardiovascular stress test    Lexiscan Myoview (1/16):  Large inferior and apical scar; no ischemia, EF 27%;  High Risk  . Inferior myocardial infarction ([place]) 1986  . Pulmonary embolism ([place])     "when I had big toe amputated"  . Chronic anemia     "due to the multiple myeloma"  . Chronic lower back pain   . Multiple myeloma ([place]) dx'd 04/2006    with recurrence of increasing problems  DR. [place] -ONCOLOGIST.  PT HAS BEEN OFF CHEMO SINCE HIS HOSPITALIZATION FEB 2013 FOR LEFT FOOT INFECTION  . Basal cell carcinoma     "chest left arm"   Past Surgical History  Procedure Laterality Date  . Lumbar laminectomy    . Cardiac catheterization  06/20/2000    Severe coronary disease (totally occluded right artery, 90% left circumflex, 50-60% intermediate, and 90-95% ostial left anterior descending)   . Coronary artery bypass graft  2002    x4 / Left internal mammary artery to the LAD.  / Saphenous vein graft to the right posterolateral.  /  Saphenous vein graft to  the ramus intermedius. / Saphenous vein graft to the circumflex marginal     . Amputation  03/06/2012    Procedure: AMPUTATION RAY;  Surgeon: Alta Corning, MD;  Location: [facility] ORS;  Service: Orthopedics;  Laterality: Left;  First  Ray Amputation  . Intramedullary (im) nail intertrochanteric Right 08/17/2013    Procedure: INTRAMEDULLARY (IM) NAIL INTERTROCHANTRIC;  Surgeon: Eldred Manges, MD;  Location: [facility] OR[facility];  Service: Orthopedics;  Laterality: Right;  . Endarterectomy femoral Left 03/24/2014    Procedure: Left Common Femoral  Artery Endarterectomy with Vein Patch Angioplasty,  Ultrasound ;  Surgeon: Chuck Hint, MD;  Location[facility] OR;  Service: Vascular;  Laterality: Left;  . Cataract extraction w/ intraocular lens  implant, bilateral Bilateral   . Lower extremity angiogram N/A 03/23/2014    Procedure: LOWER EXTREMITY ANGIOGRAM;  Surgeon: Runell Gess, MD;  Location: [facility] CATH LAB;  Service: Cardiovascular;  Laterality: N/A;  . Peripheral vascular  catheterization N/A 05/27/2015    Procedure: Lower Extremity Angiography;  Surgeon: Runell Gess, MD;  Location: [facility] INVASIVE CV LAB;  Service: Cardiovascular;  Laterality: N/A;  . Peripheral vascular catheterization  06/03/2015    Procedure: Peripheral Vascular Intervention;  Surgeon: Runell Gess, MD;  Location: [facility] INVASIVE CV LAB;  Service: Cardiovascular;;  Lt Common Iliac  . Tonsillectomy  ~ 1936  . Inguinal hernia repair Bilateral   . Fracture surgery    . Back surgery    . Basal cell carcinoma excision      "chest left arm"  . Toe amputation Left ~ 2014   Family History  Problem Relation Age of Onset  . Heart attack Mother   . Heart disease Father    Social History  Substance Use Topics  . Smoking status: Former Smoker -- 1.00 packs/day for 20 years    Types: Cigarettes    Quit date: 11/20/1984  . Smokeless tobacco: Never Used  . Alcohol Use: Yes     Comment: 06/03/2015 "might have a drink a few times/year"    Review of Systems  All other systems reviewed and are negative.     Allergies  Morphine and related  Home Medications   Prior to Admission medications   Medication Sig Start Date End Date Taking? Authorizing Provider  aspirin 81 MG chewable tablet Chew 1 tablet (81 mg total) by mouth daily. 06/04/15  Yes Azalee Course, PA  carvedilol (COREG) 6.25 MG tablet TAKE 1 TABLET TWICE A DAY WITH MEALS 09/01/15  Yes Rosalio Macadamia, NP  dexamethasone (DECADRON) 4 MG tablet Take 1 tablet (4 mg total) by mouth 2 (two) times daily with a meal. Patient taking differently: Take 4 mg by mouth daily.  12/30/15  Yes [name], MD  Heparin Lock Flush (HEPARIN FLUSH, PORCINE,) 100 UNIT/ML injection 2.61ml ( 250 ) per picc line flush. Use 1 syringe per port. 07/06/15  Yes [name], MD  HYDROmorphone (DILAUDID) 4 MG tablet Take 1 tablet (4 mg total) by mouth every 4 (four) hours as needed for severe pain. 12/20/15  Yes [name], MD  Ibuprofen-Diphenhydramine HCl  (ADVIL PM) 200-25 MG CAPS Take 2 capsules by mouth at bedtime as needed (sleep).   Yes Historical Provider, MD  isosorbide mononitrate (IMDUR) 30 MG 24 hr tablet Take 30 mg by mouth at bedtime.  12/30/15  Yes [name], MD  lisinopril (PRINIVIL,ZESTRIL) 20 MG tablet TAKE 1 TABLET DAILY 04/23/15  Yes Peter M [place], MD  mirtazapine (REMERON) 15 MG tablet Take 1 tablet (15 mg total) by mouth at bedtime. 12/30/15  Yes [name], MD  mirtazapine (REMERON) 15 MG tablet Take 15 mg by mouth at bedtime.   Yes Historical Provider, MD  Multiple Vitamin (MULTIVITAMIN)  capsule Take 1 capsule by mouth every morning.    Yes Historical Provider, MD  nitroGLYCERIN (NITROSTAT) 0.4 MG SL tablet Place 1 tablet (0.4 mg total) under the tongue every 5 (five) minutes as needed for chest pain. 09/03/14  Yes Burtis Junes, NP  nystatin (MYCOSTATIN) 100000 UNIT/ML suspension Take 5 mLs (500,000 Units total) by mouth 4 (four) times daily. 12/31/15  Yes [name], MD  rOPINIRole (REQUIP) 3 MG tablet Take 1 tablet (3 mg total) by mouth at bedtime. 10/11/15  Yes Trula Slade, DPM  sodium chloride 0.9 % SOLN 250 mL with cyclophosphamide 1 G SOLR 300 mg/m2 Inject 300 mg/m2 into the vein every 21 ( twenty-one) days.    Yes Historical Provider, MD  Sodium Chloride Flush 0.9 % SOLN injection Inject 10 mLs into the vein as directed. 11/09/15  Yes [name], MD  tamsulosin (FLOMAX) 0.4 MG CAPS capsule Take 0.4 mg by mouth 2 (two) times daily.    Yes Historical Provider, MD  vitamin C (ASCORBIC ACID) 500 MG tablet Take 250 mg by mouth 2 (two) times daily.    Yes Historical Provider, MD  B Complex-Biotin-FA (B COMPLETE PO) Take 1 tablet by mouth daily.     Historical Provider, MD  darbepoetin (ARANESP) 200 MCG/0.4ML SOLN injection Inject 200 mcg into the skin as directed.    Historical Provider, MD  diphenoxylate-atropine (LOMOTIL) 2.5-0.025 MG tablet Take 1 tablet by mouth 4 (four) times daily as needed  for diarrhea or loose stools. 09/27/15   [name], MD  furosemide (LASIX) 40 MG tablet TAKE 1 TABLET DAILY Patient not taking: Reported on 01/01/2016 10/11/15   Peter M Martinique, MD  polyethylene glycol [facility] / Floria Raveling) packet Take 17 g by mouth daily as needed. 09/05/13   Ivan Anchors Love, PA-C   BP 126/64 mmHg  Pulse 89  Temp(Src) 97.7 F (36.5 C) (Oral)  Resp 18  SpO2 96% Physical Exam  Constitutional: He is oriented to person, place, and time. He appears well-developed and well-nourished.  Non-toxic appearance. No distress.  HENT:  Head: Normocephalic and atraumatic.  Mouth/Throat: No oropharyngeal exudate or posterior oropharyngeal edema.  Eyes: Conjunctivae, EOM and lids are normal. Pupils are equal, round, and reactive to light.  Neck: Normal range of motion. Neck supple. No tracheal deviation present. No thyroid mass present.  Cardiovascular: Normal rate, regular rhythm and normal heart sounds.  Exam reveals no gallop.   No murmur heard. Pulmonary/Chest: Effort normal and breath sounds normal. No stridor. No respiratory distress. He has no decreased breath sounds. He has no wheezes. He has no rhonchi. He has no rales.  Abdominal: Soft. Normal appearance and bowel sounds are normal. He exhibits no distension. There is no tenderness. There is no rebound and no CVA tenderness.  Musculoskeletal: Normal range of motion. He exhibits no edema or tenderness.  Neurological: He is alert and oriented to person, place, and time. He has normal strength. No cranial nerve deficit or sensory deficit. GCS eye subscore is 4. GCS verbal subscore is 5. GCS motor subscore is 6.  Skin: Skin is warm and dry. No abrasion and no rash noted.  Psychiatric: He has a normal mood and affect. His speech is normal and behavior is normal.  Nursing note and vitals reviewed.   ED Course  Procedures (including critical care time) Labs Review Labs Reviewed  CBC WITH DIFFERENTIAL/PLATELET  COMPREHENSIVE  METABOLIC PANEL  URINALYSIS, ROUTINE W REFLEX MICROSCOPIC (NOT AT [facility])  Imaging Review No results found. I have personally reviewed and evaluated these images and lab results as part of my medical decision-making.   EKG Interpretation None      MDM   Final diagnoses:  None    Patient given IV fluids and has evidence of acute kidney injury. Be admitted for dehydration and treatment of his likely candidal esophagitis    Lacretia Leigh, MD 01/01/16 1843

## 2016-01-01 NOTE — ED Notes (Signed)
Pt reported difficulty swallowing for past 3 days. Pt stated that he has phelgm buildup in his throat and it seems to not go down. Pt reported poor appetite but denies n/v and fever.

## 2016-01-01 NOTE — Telephone Encounter (Signed)
Per 2/9 pof cx 2/14 appointments and moved all 2/21 appointments to 2/22 and add f/u w/GM for 2/22 @ 12pm - f/u added for 12:30 pm due to 12 pm slot already taken per GM. Left message for patient and mailed schedule.

## 2016-01-01 NOTE — ED Notes (Signed)
Pt reports sore throat and unable to eat x 3 days, was seen by oncologist yesterday and was prescribed Nystatin yet no relief. Hx multiple myeloma and end stage congestive heart failure. Pt  Reports severe pain when swallowing. No  Obvious throat abscess nor redness upon assessment. Pt has picc line right arm .

## 2016-01-02 DIAGNOSIS — N179 Acute kidney failure, unspecified: Secondary | ICD-10-CM

## 2016-01-02 DIAGNOSIS — N189 Chronic kidney disease, unspecified: Secondary | ICD-10-CM

## 2016-01-02 DIAGNOSIS — D638 Anemia in other chronic diseases classified elsewhere: Secondary | ICD-10-CM

## 2016-01-02 DIAGNOSIS — E860 Dehydration: Secondary | ICD-10-CM

## 2016-01-02 LAB — CBC
HCT: 27.6 % — ABNORMAL LOW (ref 39.0–52.0)
HEMOGLOBIN: 8.7 g/dL — AB (ref 13.0–17.0)
MCH: 26.6 pg (ref 26.0–34.0)
MCHC: 31.5 g/dL (ref 30.0–36.0)
MCV: 84.4 fL (ref 78.0–100.0)
Platelets: 104 10*3/uL — ABNORMAL LOW (ref 150–400)
RBC: 3.27 MIL/uL — AB (ref 4.22–5.81)
RDW: 20.7 % — ABNORMAL HIGH (ref 11.5–15.5)
WBC: 11.3 10*3/uL — ABNORMAL HIGH (ref 4.0–10.5)

## 2016-01-02 LAB — COMPREHENSIVE METABOLIC PANEL
ALK PHOS: 270 U/L — AB (ref 38–126)
ALT: 28 U/L (ref 17–63)
ANION GAP: 13 (ref 5–15)
AST: 86 U/L — ABNORMAL HIGH (ref 15–41)
Albumin: 2.7 g/dL — ABNORMAL LOW (ref 3.5–5.0)
BUN: 77 mg/dL — ABNORMAL HIGH (ref 6–20)
CALCIUM: 8 mg/dL — AB (ref 8.9–10.3)
CO2: 16 mmol/L — AB (ref 22–32)
Chloride: 114 mmol/L — ABNORMAL HIGH (ref 101–111)
Creatinine, Ser: 2.2 mg/dL — ABNORMAL HIGH (ref 0.61–1.24)
GFR calc non Af Amer: 26 mL/min — ABNORMAL LOW (ref >60)
GFR, EST AFRICAN AMERICAN: 30 mL/min — AB (ref >60)
Glucose, Bld: 103 mg/dL — ABNORMAL HIGH (ref 65–99)
Potassium: 3.7 mmol/L (ref 3.5–5.1)
SODIUM: 143 mmol/L (ref 135–145)
TOTAL PROTEIN: 7 g/dL (ref 6.5–8.1)
Total Bilirubin: 0.7 mg/dL (ref 0.3–1.2)

## 2016-01-02 LAB — TSH: TSH: 4.373 u[IU]/mL (ref 0.350–4.500)

## 2016-01-02 LAB — MAGNESIUM: MAGNESIUM: 2.3 mg/dL (ref 1.7–2.4)

## 2016-01-02 LAB — SODIUM, URINE, RANDOM: SODIUM UR: 63 mmol/L

## 2016-01-02 LAB — CREATININE, URINE, RANDOM: CREATININE, URINE: 105.29 mg/dL

## 2016-01-02 LAB — PHOSPHORUS: Phosphorus: 4.3 mg/dL (ref 2.5–4.6)

## 2016-01-02 MED ORDER — NYSTATIN 100000 UNIT/ML MT SUSP
5.0000 mL | Freq: Four times a day (QID) | OROMUCOSAL | Status: DC
  Administered 2016-01-02 – 2016-01-05 (×12): 500000 [IU] via ORAL
  Filled 2016-01-02 (×12): qty 5

## 2016-01-02 MED ORDER — PANTOPRAZOLE SODIUM 40 MG IV SOLR
40.0000 mg | Freq: Two times a day (BID) | INTRAVENOUS | Status: DC
  Administered 2016-01-02 – 2016-01-03 (×4): 40 mg via INTRAVENOUS
  Filled 2016-01-02 (×4): qty 40

## 2016-01-02 MED ORDER — HYDROMORPHONE HCL 1 MG/ML IJ SOLN
1.0000 mg | INTRAMUSCULAR | Status: DC | PRN
  Administered 2016-01-02 – 2016-01-04 (×12): 1 mg via INTRAVENOUS
  Filled 2016-01-02 (×13): qty 1

## 2016-01-02 NOTE — Progress Notes (Signed)
Patient requests ice chips, states mouth very dry.  Currently with NPO orders.  RN explained NPO order as well as patient's reports that he has not been able to swallow food or liquids which are concerning for aspiration.  RN talked about swabbing mouth with water as well as using antibacterial moisturizing mouth rinse and mouth moisturizer.  Patient states " I can handle myself" and demands to have ice chips.  RN explained risk for aspiration and patient wants ice chips in spite of risk.  RN notified MD and obtained order for ice chips.

## 2016-01-02 NOTE — Progress Notes (Signed)
PROGRESS NOTE  Timothy [name] KYH:[id number] DOB: 1928/10/22 DOA: 01/01/2016 PCP: [name], MD  Assessment/Plan: Candidal esophagitis (HCC) resulting in severe odynophagia presumed candidal esophagitis will treat with fluconazole IV, Seen by Dr. Amedeo Plenty-- EGD in AM -nystatin swish and spit  Dehydration admit administer IV fluids given that patient unable to tolerate any by mouth make sure to add glucose to IV fluids   Multiple myeloma ([place]) - as per oncology note has been fairly stable Will attempt to get palliative care consult here as Dr. Griffith Citron was arranging at home  Hypertension chronic unable to tolerate medications secondary to pain as swallowing. We'll continue beta blocker IV withholding parameters   Anemia of chronic disease stable continue to monitor current asymptomatic   Chronic systolic CHF (congestive heart failure), NYHA class 2 (HCC) avoid fluid overload but currently appears to be dehydrated we'll continue to hold Lasix   Acute on chronic renal failure (HCC) hold lisinopril. Likely secondary to dehydration. IVF  bacteria in urine - no evidence of UTI clinically will hold off on antibiotics for now and await results of urine culture  Code Status: DNR Family Communication: patient Disposition Plan:    Consultants:  GI  Palliative care  Procedures:  HPI/Subjective: Asking for ICE chips says he does ok at home with those   Objective: Filed Vitals:   01/02/16 0028 01/02/16 0501  BP: 121/49 121/59  Pulse: 95 86  Temp:  97.6 F (36.4 C)  Resp:  16    Intake/Output Summary (Last 24 hours) at 01/02/16 1126 Last data filed at 01/02/16 0640  Gross per 24 hour  Intake      0 ml  Output    600 ml  Net   -600 ml   Filed Weights   01/01/16 2130  Weight: 79.379 kg (175 lb)    Exam:   General:  Awake, chronically ill appearing  Cardiovascular: rrr  Respiratory: clear  Abdomen: +BS, soft  Musculoskeletal: no edema   Data  Reviewed: Basic Metabolic Panel:  Recent Labs Lab 12/28/15 1010 01/01/16 1626 01/02/16 0640  NA 134* 141 143  K 4.0 4.1 3.7  CL  --  110 114*  CO2 19* 13* 16*  GLUCOSE 101 120* 103*  BUN 28.1* 83* 77*  CREATININE 1.6* 2.88* 2.20*  CALCIUM 8.3* 8.2* 8.0*  MG  --   --  2.3  PHOS  --   --  4.3   Liver Function Tests:  Recent Labs Lab 12/28/15 1010 12/28/15 1535 01/01/16 1626 01/02/16 0640  AST 104* 107* 109* 86*  ALT '27 25 30 28  '$ ALKPHOS 198* 201* 303* 270*  BILITOT 0.55 0.7 0.8 0.7  PROT 7.4 7.1 7.6 7.0  ALBUMIN 3.0* 3.6 3.0* 2.7*   No results for input(s): LIPASE, AMYLASE in the last 168 hours. No results for input(s): AMMONIA in the last 168 hours. CBC:  Recent Labs Lab 12/28/15 1009 01/01/16 1626 01/02/16 0640  WBC 8.2 14.0* 11.3*  NEUTROABS 5.5 11.5*  --   HGB 9.3* 9.3* 8.7*  HCT 29.2* 28.9* 27.6*  MCV 82.7 83.3 84.4  PLT 94 Large platelets present* 102* 104*   Cardiac Enzymes: No results for input(s): CKTOTAL, CKMB, CKMBINDEX, TROPONINI in the last 168 hours. BNP (last 3 results) No results for input(s): BNP in the last 8760 hours.  ProBNP (last 3 results) No results for input(s): PROBNP in the last 8760 hours.  CBG: No results for input(s): GLUCAP in the last 168 hours.  Recent Results (from  the past 240 hour(s))  TECHNOLOGIST REVIEW     Status: None   Collection Time: 12/28/15 10:09 AM  Result Value Ref Range Status   Technologist Review   Final    Metas and Myelocytes present. few ovalos & teardrops     Studies: No results found.  Scheduled Meds: . fluconazole (DIFLUCAN) IV  200 mg Intravenous Q24H  . metoprolol  2.5 mg Intravenous 4 times per day  . mirtazapine  15 mg Oral QHS  . nystatin  5 mL Oral QID  . pantoprazole (PROTONIX) IV  40 mg Intravenous Q12H   Continuous Infusions: . dextrose 5 % and 0.45% NaCl 75 mL/hr at 01/01/16 2237   Antibiotics Given (last 72 hours)    None      Active Problems:   Multiple myeloma  (HCC)   Hypertension   Hypokalemia   Anemia of chronic disease   Dehydration   Chronic systolic CHF (congestive heart failure), NYHA class 2 ([place])   Acute on chronic renal failure ([place])   Candidal esophagitis ([place])   Odynophagia    Time spent: 35 min    [facility] Pager [phone]. If 7PM-7AM, please contact night-coverage at [email or web address], password [facility] 01/02/2016, 11:26 AM  LOS: 1 day

## 2016-01-02 NOTE — Consult Note (Signed)
[facility] Consult Note  Referring Provider: No ref. provider found Primary Care Physician:  [name], MD Primary Gastroenterologist:  Dr.  Laurel Dimmer Complaint: Odynophagia HPI: Timothy [name] is an 82 y.o. white male  who presents with a 3 to four-day history of worsening odynophagia dehydration. He denies any sensation of food impaction, pill impaction or any esophageal obstruction. He is having worsening pain on swallowing to where he can barely keep down clear liquids. He has no history of reflux but does have a history of shingles.  He was started on nystatin for some oral thrush prior to admission  Past Medical History  Diagnosis Date  . ASCVD (arteriosclerotic cardiovascular disease)   . Gout   . Hypertension   . Lumbar disc disease     post laminectomy  . History of echocardiogram 11/02/2010    EF range of 30 to 35% / There is hypokinsesis of the basal-mild inferolateral myocardium  . CHF (congestive heart failure) ([place])   . Heart murmur   . Ischemic cardiomyopathy   . Renal insufficiency   . Peripheral neuropathy ([place])   . Hyperlipidemia   . SOB (shortness of breath)   . Fatigue   . Peripheral neuropathy ([place])   . Coronary artery disease     CABG 2002 by Dr. Roxy Manns with LIMA to LAD, SVG to intermediate, SVG to LCX, & SVG to PL. Last nuclear in 2012 showing large inferior scar with EF of 39%.   . Blood transfusion 10/2011; 07/2013    "multiple myeloma; when I broke my leg"  . History of shingles MARCH 2009    SHINGLE LESIONS WERE AROUND RIGHT EYE--PT HAS RESIDUAL ITCHING AROUND THE EYE.  . Osteomyelitis ([place])     s/p toe amputation  . Chronic systolic heart failure (HCC)     EF of 30% per echo 02/2012  . Protein malnutrition ([place])   . Critical lower limb ischemia   . Peripheral vascular disease ([place])   . Hx of cardiovascular stress test     Lexiscan Myoview (1/16):  Large inferior and apical scar; no ischemia, EF 27%;  High Risk  . Inferior  myocardial infarction ([place]) 1986  . Pulmonary embolism ([place])     "when I had big toe amputated"  . Chronic anemia     "due to the multiple myeloma"  . Chronic lower back pain   . Multiple myeloma ([place]) dx'd 04/2006    with recurrence of increasing problems  DR. Champion -ONCOLOGIST.  PT HAS BEEN OFF CHEMO SINCE HIS HOSPITALIZATION FEB 2013 FOR LEFT FOOT INFECTION  . Basal cell carcinoma     "chest left arm"    Past Surgical History  Procedure Laterality Date  . Lumbar laminectomy    . Cardiac catheterization  06/20/2000    Severe coronary disease (totally occluded right artery, 90% left circumflex, 50-60% intermediate, and 90-95% ostial left anterior descending)   . Coronary artery bypass graft  2002    x4 / Left internal mammary artery to the LAD.  / Saphenous vein graft to the right posterolateral.  /  Saphenous vein graft to  the ramus intermedius. / Saphenous vein graft to the circumflex marginal     . Amputation  03/06/2012    Procedure: AMPUTATION RAY;  Surgeon: Alta Corning, MD;  Location: [facility];  Service: Orthopedics;  Laterality: Left;  First Ray Amputation  . Intramedullary (im) nail intertrochanteric Right 08/17/2013    Procedure: INTRAMEDULLARY (IM) NAIL INTERTROCHANTRIC;  Surgeon: Thana Farr  Lorin Mercy, MD;  Location: [facility];  Service: Orthopedics;  Laterality: Right;  . Endarterectomy femoral Left 03/24/2014    Procedure: Left Common Femoral  Artery Endarterectomy with Vein Patch Angioplasty,  Ultrasound ;  Surgeon: Angelia Mould, MD;  Location: [facility];  Service: Vascular;  Laterality: Left;  . Cataract extraction w/ intraocular lens  implant, bilateral Bilateral   . Lower extremity angiogram N/A 03/23/2014    Procedure: LOWER EXTREMITY ANGIOGRAM;  Surgeon: Lorretta Harp, MD;  Location: [facility] CATH LAB;  Service: Cardiovascular;  Laterality: N/A;  . Peripheral vascular catheterization N/A 05/27/2015    Procedure: Lower Extremity Angiography;  Surgeon: Lorretta Harp, MD;  Location:  [facility] CV LAB;  Service: Cardiovascular;  Laterality: N/A;  . Peripheral vascular catheterization  06/03/2015    Procedure: Peripheral Vascular Intervention;  Surgeon: Lorretta Harp, MD;  Location: [place] [facility];  Service: Cardiovascular;;  Lt Common Iliac  . Tonsillectomy  ~ 1936  . Inguinal hernia repair Bilateral   . Fracture surgery    . Back surgery    . Basal cell carcinoma excision      "chest left arm"  . Toe amputation Left ~ 2014    Medications Prior to Admission  Medication Sig Dispense Refill  . aspirin 81 MG chewable tablet Chew 1 tablet (81 mg total) by mouth daily.    . carvedilol (COREG) 6.25 MG tablet TAKE 1 TABLET TWICE A DAY WITH MEALS 180 tablet 2  . dexamethasone (DECADRON) 4 MG tablet Take 1 tablet (4 mg total) by mouth 2 (two) times daily with a meal. (Patient taking differently: Take 4 mg by mouth daily. ) 30 tablet 4  . Heparin Lock Flush (HEPARIN FLUSH, PORCINE,) 100 UNIT/ML injection 2.78m ( 250 ) per picc line flush. Use 1 syringe per port. 30 Syringe 3  . HYDROmorphone (DILAUDID) 4 MG tablet Take 1 tablet (4 mg total) by mouth every 4 (four) hours as needed for severe pain. 120 tablet 0  . Ibuprofen-Diphenhydramine HCl (ADVIL PM) 200-25 MG CAPS Take 2 capsules by mouth at bedtime as needed (sleep).    . isosorbide mononitrate (IMDUR) 30 MG 24 hr tablet Take 30 mg by mouth at bedtime.     .Marland Kitchenlisinopril (PRINIVIL,ZESTRIL) 20 MG tablet TAKE 1 TABLET DAILY 90 tablet 2  . mirtazapine (REMERON) 15 MG tablet Take 1 tablet (15 mg total) by mouth at bedtime. 30 tablet 4  . mirtazapine (REMERON) 15 MG tablet Take 15 mg by mouth at bedtime.    . Multiple Vitamin (MULTIVITAMIN) capsule Take 1 capsule by mouth every morning.     . nitroGLYCERIN (NITROSTAT) 0.4 MG SL tablet Place 1 tablet (0.4 mg total) under the tongue every 5 (five) minutes as needed for chest pain. 25 tablet 3  . nystatin (MYCOSTATIN) 100000 UNIT/ML suspension Take 5 mLs (500,000 Units total) by  mouth 4 (four) times daily. 180 mL 0  . rOPINIRole (REQUIP) 3 MG tablet Take 1 tablet (3 mg total) by mouth at bedtime. 90 tablet 11  . sodium chloride 0.9 % SOLN 250 mL with cyclophosphamide 1 G SOLR 300 mg/m2 Inject 300 mg/m2 into the vein every 21 ( twenty-one) days.     . Sodium Chloride Flush 0.9 % SOLN injection Inject 10 mLs into the vein as directed. 60 Syringe 3  . tamsulosin (FLOMAX) 0.4 MG CAPS capsule Take 0.4 mg by mouth 2 (two) times daily.     . vitamin C (ASCORBIC ACID) 500 MG  tablet Take 250 mg by mouth 2 (two) times daily.     . B Complex-Biotin-FA (B COMPLETE PO) Take 1 tablet by mouth daily.     . darbepoetin (ARANESP) 200 MCG/0.4ML SOLN injection Inject 200 mcg into the skin as directed.    . diphenoxylate-atropine (LOMOTIL) 2.5-0.025 MG tablet Take 1 tablet by mouth 4 (four) times daily as needed for diarrhea or loose stools. 30 tablet 0  . furosemide (LASIX) 40 MG tablet TAKE 1 TABLET DAILY (Patient not taking: Reported on 01/01/2016) 90 tablet 0  . polyethylene glycol (MIRALAX / GLYCOLAX) packet Take 17 g by mouth daily as needed. 14 each 0    Allergies:  Allergies  Allergen Reactions  . Morphine And Related Other (See Comments)    Agitated with morphine drip    Family History  Problem Relation Age of Onset  . Heart attack Mother   . Heart disease Father     Social History:  reports that he quit smoking about 31 years ago. His smoking use included Cigarettes. He has a 20 pack-year smoking history. He has never used smokeless tobacco. He reports that he drinks alcohol. He reports that he does not use illicit drugs.  Review of Systems: negative except as above   Blood pressure 121/59, pulse 86, temperature 97.6 F (36.4 C), temperature source Oral, resp. rate 16, height '5\' 10"'$  (1.778 m), weight 79.379 kg (175 lb), SpO2 92 %. Head: Normocephalic, without obvious abnormality, atraumatic Neck: no adenopathy, no carotid bruit, no JVD, supple, symmetrical, trachea  midline and thyroid not enlarged, symmetric, no tenderness/mass/nodules throat with yellowish tonsillar exudate, no definite thrush Resp: clear to auscultation bilaterally Cardio: regular rate and rhythm, S1, S2 normal, no murmur, click, rub or gallop GI: Abdomen soft nondistended Extremities: extremities normal, atraumatic, no cyanosis or edema  Results for orders placed or performed during the hospital encounter of 01/01/16 (from the past 48 hour(s))  CBC with Differential/Platelet     Status: Abnormal   Collection Time: 01/01/16  4:26 PM  Result Value Ref Range   WBC 14.0 (H) 4.0 - 10.5 K/uL   RBC 3.47 (L) 4.22 - 5.81 MIL/uL   Hemoglobin 9.3 (L) 13.0 - 17.0 g/dL   HCT 28.9 (L) 39.0 - 52.0 %   MCV 83.3 78.0 - 100.0 fL   MCH 26.8 26.0 - 34.0 pg   MCHC 32.2 30.0 - 36.0 g/dL   RDW 20.7 (H) 11.5 - 15.5 %   Platelets 102 (L) 150 - 400 K/uL    Comment: RESULT REPEATED AND VERIFIED SPECIMEN CHECKED FOR CLOTS PLATELET COUNT CONFIRMED BY SMEAR    Neutrophils Relative % 82 %   Lymphocytes Relative 12 %   Monocytes Relative 5 %   Eosinophils Relative 0 %   Basophils Relative 1 %   Neutro Abs 11.5 (H) 1.7 - 7.7 K/uL   Lymphs Abs 1.7 0.7 - 4.0 K/uL   Monocytes Absolute 0.7 0.1 - 1.0 K/uL   Eosinophils Absolute 0.0 0.0 - 0.7 K/uL   Basophils Absolute 0.1 0.0 - 0.1 K/uL   RBC Morphology RARE NRBCs     Comment: ELLIPTOCYTES SCHISTOCYTES PRESENT (2-5/hpf) CRENATED RBCs    WBC Morphology MILD LEFT SHIFT (1-5% METAS, OCC MYELO, OCC BANDS)   Comprehensive metabolic panel     Status: Abnormal   Collection Time: 01/01/16  4:26 PM  Result Value Ref Range   Sodium 141 135 - 145 mmol/L   Potassium 4.1 3.5 - 5.1 mmol/L   Chloride 110  101 - 111 mmol/L   CO2 13 (L) 22 - 32 mmol/L   Glucose, Bld 120 (H) 65 - 99 mg/dL   BUN 83 (H) 6 - 20 mg/dL   Creatinine, Ser 2.88 (H) 0.61 - 1.24 mg/dL   Calcium 8.2 (L) 8.9 - 10.3 mg/dL   Total Protein 7.6 6.5 - 8.1 g/dL   Albumin 3.0 (L) 3.5 - 5.0 g/dL    AST 109 (H) 15 - 41 U/L   ALT 30 17 - 63 U/L   Alkaline Phosphatase 303 (H) 38 - 126 U/L   Total Bilirubin 0.8 0.3 - 1.2 mg/dL   GFR calc non Af Amer 18 (L) >60 mL/min   GFR calc Af Amer 21 (L) >60 mL/min    Comment: (NOTE) The eGFR has been calculated using the CKD EPI equation. This calculation has not been validated in all clinical situations. eGFR's persistently <60 mL/min signify possible Chronic Kidney Disease.    Anion gap 18 (H) 5 - 15  Urinalysis, Routine w reflex microscopic (not at [facility])     Status: Abnormal   Collection Time: 01/01/16  5:13 PM  Result Value Ref Range   Color, Urine YELLOW YELLOW   APPearance TURBID (A) CLEAR   Specific Gravity, Urine 1.019 1.005 - 1.030   pH 5.0 5.0 - 8.0   Glucose, UA NEGATIVE NEGATIVE mg/dL   Hgb urine dipstick MODERATE (A) NEGATIVE   Bilirubin Urine NEGATIVE NEGATIVE   Ketones, ur NEGATIVE NEGATIVE mg/dL   Protein, ur 100 (A) NEGATIVE mg/dL   Nitrite NEGATIVE NEGATIVE   Leukocytes, UA NEGATIVE NEGATIVE  Urine microscopic-add on     Status: Abnormal   Collection Time: 01/01/16  5:13 PM  Result Value Ref Range   Squamous Epithelial / LPF NONE SEEN NONE SEEN   WBC, UA 0-5 0 - 5 WBC/hpf   RBC / HPF NONE SEEN 0 - 5 RBC/hpf   Bacteria, UA MANY (A) NONE SEEN   Casts GRANULAR CAST (A) NEGATIVE   Urine-Other AMORPHOUS URATES/PHOSPHATES   Magnesium     Status: None   Collection Time: 01/02/16  6:40 AM  Result Value Ref Range   Magnesium 2.3 1.7 - 2.4 mg/dL  Phosphorus     Status: None   Collection Time: 01/02/16  6:40 AM  Result Value Ref Range   Phosphorus 4.3 2.5 - 4.6 mg/dL  Comprehensive metabolic panel     Status: Abnormal   Collection Time: 01/02/16  6:40 AM  Result Value Ref Range   Sodium 143 135 - 145 mmol/L   Potassium 3.7 3.5 - 5.1 mmol/L   Chloride 114 (H) 101 - 111 mmol/L   CO2 16 (L) 22 - 32 mmol/L   Glucose, Bld 103 (H) 65 - 99 mg/dL   BUN 77 (H) 6 - 20 mg/dL   Creatinine, Ser 2.20 (H) 0.61 - 1.24 mg/dL    Calcium 8.0 (L) 8.9 - 10.3 mg/dL   Total Protein 7.0 6.5 - 8.1 g/dL   Albumin 2.7 (L) 3.5 - 5.0 g/dL   AST 86 (H) 15 - 41 U/L   ALT 28 17 - 63 U/L   Alkaline Phosphatase 270 (H) 38 - 126 U/L   Total Bilirubin 0.7 0.3 - 1.2 mg/dL   GFR calc non Af Amer 26 (L) >60 mL/min   GFR calc Af Amer 30 (L) >60 mL/min    Comment: (NOTE) The eGFR has been calculated using the CKD EPI equation. This calculation has not been validated in all  clinical situations. eGFR's persistently <60 mL/min signify possible Chronic Kidney Disease.    Anion gap 13 5 - 15  CBC     Status: Abnormal   Collection Time: 01/02/16  6:40 AM  Result Value Ref Range   WBC 11.3 (H) 4.0 - 10.5 K/uL   RBC 3.27 (L) 4.22 - 5.81 MIL/uL   Hemoglobin 8.7 (L) 13.0 - 17.0 g/dL   HCT 27.6 (L) 39.0 - 52.0 %   MCV 84.4 78.0 - 100.0 fL   MCH 26.6 26.0 - 34.0 pg   MCHC 31.5 30.0 - 36.0 g/dL   RDW 20.7 (H) 11.5 - 15.5 %   Platelets 104 (L) 150 - 400 K/uL    Comment: CONSISTENT WITH PREVIOUS RESULT   *Note: Due to a large number of results and/or encounters for the requested time period, some results have not been displayed. A complete set of results can be found in Results Review.   No results found.  Assessment: Odynophagia consistent with infectious, caustic, or pill esophagitis Plan:  IV Diflucan IV PPI Endoscopy tomorrow morning. Tearra Ouk C 01/02/2016, 7:44 AM  Pager [phone] If no answer or after 5 PM call [phone]

## 2016-01-02 NOTE — Evaluation (Signed)
Physical Therapy Evaluation Patient Details Name: Timothy [name] MRN: [id number] DOB: [birth year] Today's Date: 01/02/2016   History of Present Illness  43 yp male admitted with dehydration, candidal esophagitis. Hx of multiple myeloma, HF-end stage, gout, HTN, neuropathy, osteomyelitis, chronic back pain  Clinical Impression  On eval, pt required Mod assist to stand, Min assist +2 for safety for ambulation. Pt fatigues fairly easily/quickly. Ambulation was effortful. Wife was present during session. Pt/wife prefer home for d/c plan. Recommend HHPT follow up-pt hesitant to agree at time of eval. Will follow and progress activity as able.     Follow Up Recommendations Home health PT;Supervision/Assistance - 24 hour    Equipment Recommendations  None recommended by PT    Recommendations for Other Services       Precautions / Restrictions Precautions Precautions: Fall Precaution Comments: PICC line R arm Restrictions Weight Bearing Restrictions: No      Mobility  Bed Mobility               General bed mobility comments: oob in recliner  Transfers Overall transfer level: Needs assistance   Transfers: Sit to/from Stand Sit to Stand: Mod assist         General transfer comment: Assist to rise, stabilize, control descent. VCs safety, hand placement. Increased time  Ambulation/Gait Ambulation/Gait assistance: Min assist;+2 physical assistance;+2 safety/equipment Ambulation Distance (Feet): 25 Feet Assistive device: Rolling walker (2 wheeled) Gait Pattern/deviations: Trunk flexed;Decreased step length - right     General Gait Details: Unsteady. Fatigues quickly. short distance was effortful. Assist to stabilize.   Stairs            Wheelchair Mobility    Modified Rankin (Stroke Patients Only)       Balance Overall balance assessment: History of Falls;Needs assistance         Standing balance support: Bilateral upper extremity supported;During  functional activity Standing balance-Leahy Scale: Poor                               Pertinent Vitals/Pain Pain Assessment: Faces Faces Pain Scale: Hurts a little bit Pain Location: mouth    Home Living Family/patient expects to be discharged to:: Private residence Living Arrangements: Spouse/significant other Available Help at Discharge: Family;Available 24 hours/day Type of Home: House Home Access: Stairs to enter Entrance Stairs-Rails: None Entrance Stairs-Number of Steps: 1 Home Layout: Two level;Able to live on main level with bedroom/bathroom Home Equipment: Dan Humphreys - 2 wheels;Bedside commode;Wheelchair - manual      Prior Function Level of Independence: Independent with assistive device(s)         Comments: uses RW     Hand Dominance        Extremity/Trunk Assessment   Upper Extremity Assessment: Generalized weakness           Lower Extremity Assessment: Generalized weakness      Cervical / Trunk Assessment: Kyphotic  Communication   Communication: No difficulties  Cognition Arousal/Alertness: Awake/alert Behavior During Therapy: WFL for tasks assessed/performed Overall Cognitive Status: Within Functional Limits for tasks assessed                      General Comments      Exercises        Assessment/Plan    PT Assessment Patient needs continued PT services  PT Diagnosis Difficulty walking;Generalized weakness   PT Problem List Decreased strength;Decreased activity tolerance;Decreased balance;Decreased mobility;Pain;Decreased knowledge of use  of DME  PT Treatment Interventions DME instruction;Gait training;Functional mobility training;Therapeutic activities;Patient/family education;Therapeutic exercise;Balance training   PT Goals (Current goals can be found in the Care Plan section) Acute Rehab PT Goals Patient Stated Goal: get stronger. return home PT Goal Formulation: With patient/family Time For Goal Achievement:  01/16/16 Potential to Achieve Goals: Good    Frequency Min 3X/week   Barriers to discharge        Co-evaluation               End of Session   Activity Tolerance: Patient limited by fatigue Patient left: in chair;with call bell/phone within reach;with chair alarm set;with family/visitor present           Time: 0940-7680 PT Time Calculation (min) (ACUTE ONLY): 11 min   Charges:   PT Evaluation $PT Eval Moderate Complexity: 1 Procedure     PT G Codes:        Weston Anna, MPT Pager: [phone]

## 2016-01-02 NOTE — Evaluation (Signed)
Clinical/Bedside Swallow Evaluation Patient Details  Name: Timothy [name] MRN: [id number] Date of Birth: 08/16/1928  Today's Date: 01/02/2016 Time: SLP Start Time (ACUTE ONLY): 8185 SLP Stop Time (ACUTE ONLY): 1748 SLP Time Calculation (min) (ACUTE ONLY): 21 min  Past Medical History:  Past Medical History  Diagnosis Date  . ASCVD (arteriosclerotic cardiovascular disease)   . Gout   . Hypertension   . Lumbar disc disease     post laminectomy  . History of echocardiogram 11/02/2010    EF range of 30 to 35% / There is hypokinsesis of the basal-mild inferolateral myocardium  . CHF (congestive heart failure) ([place])   . Heart murmur   . Ischemic cardiomyopathy   . Renal insufficiency   . Peripheral neuropathy ([place])   . Hyperlipidemia   . SOB (shortness of breath)   . Fatigue   . Peripheral neuropathy ([place])   . Coronary artery disease     CABG 2002 by Dr. Roxy Manns with LIMA to LAD, SVG to intermediate, SVG to LCX, & SVG to PL. Last nuclear in 2012 showing large inferior scar with EF of 39%.   . Blood transfusion 10/2011; 07/2013    "multiple myeloma; when I broke my leg"  . History of shingles MARCH 2009    SHINGLE LESIONS WERE AROUND RIGHT EYE--PT HAS RESIDUAL ITCHING AROUND THE EYE.  . Osteomyelitis ([place])     s/p toe amputation  . Chronic systolic heart failure (HCC)     EF of 30% per echo 02/2012  . Protein malnutrition ([place])   . Critical lower limb ischemia   . Peripheral vascular disease ([place])   . Hx of cardiovascular stress test     Lexiscan Myoview (1/16):  Large inferior and apical scar; no ischemia, EF 27%;  High Risk  . Inferior myocardial infarction ([place]) 1986  . Pulmonary embolism ([place])     "when I had big toe amputated"  . Chronic anemia     "due to the multiple myeloma"  . Chronic lower back pain   . Multiple myeloma ([place]) dx'd 04/2006    with recurrence of increasing problems  DR. [place] -ONCOLOGIST.  PT HAS BEEN OFF CHEMO SINCE HIS HOSPITALIZATION FEB 2013 FOR  LEFT FOOT INFECTION  . Basal cell carcinoma     "chest left arm"   Past Surgical History:  Past Surgical History  Procedure Laterality Date  . Lumbar laminectomy    . Cardiac catheterization  06/20/2000    Severe coronary disease (totally occluded right artery, 90% left circumflex, 50-60% intermediate, and 90-95% ostial left anterior descending)   . Coronary artery bypass graft  2002    x4 / Left internal mammary artery to the LAD.  / Saphenous vein graft to the right posterolateral.  /  Saphenous vein graft to  the ramus intermedius. / Saphenous vein graft to the circumflex marginal     . Amputation  03/06/2012    Procedure: AMPUTATION RAY;  Surgeon: Alta Corning, MD;  Location: [facility];  Service: Orthopedics;  Laterality: Left;  First Ray Amputation  . Intramedullary (im) nail intertrochanteric Right 08/17/2013    Procedure: INTRAMEDULLARY (IM) NAIL INTERTROCHANTRIC;  Surgeon: Marybelle Killings, MD;  Location: [facility];  Service: Orthopedics;  Laterality: Right;  . Endarterectomy femoral Left 03/24/2014    Procedure: Left Common Femoral  Artery Endarterectomy with Vein Patch Angioplasty,  Ultrasound ;  Surgeon: Angelia Mould, MD;  Location: [facility];  Service: Vascular;  Laterality: Left;  . Cataract extraction w/  intraocular lens  implant, bilateral Bilateral   . Lower extremity angiogram N/A 03/23/2014    Procedure: LOWER EXTREMITY ANGIOGRAM;  Surgeon: Lorretta Harp, MD;  Location: [facility] CATH LAB;  Service: Cardiovascular;  Laterality: N/A;  . Peripheral vascular catheterization N/A 05/27/2015    Procedure: Lower Extremity Angiography;  Surgeon: Lorretta Harp, MD;  Location: [facility] CV LAB;  Service: Cardiovascular;  Laterality: N/A;  . Peripheral vascular catheterization  06/03/2015    Procedure: Peripheral Vascular Intervention;  Surgeon: Lorretta Harp, MD;  Location: [facility];  Service: Cardiovascular;;  Lt Common Iliac  . Tonsillectomy  ~ 1936  . Inguinal hernia repair  Bilateral   . Fracture surgery    . Back surgery    . Basal cell carcinoma excision      "chest left arm"  . Toe amputation Left ~ 2014   HPI:  Timothy [name] is a 82 y.o. male  has a past medical history of ASCVD ; Gout; Hypertension; Lumbar disc disease;  CHF (congestive heart failure) ; Heart murmur; Ischemic cardiomyopathy; Renal insufficiency; Peripheral neuropathy; Hyperlipidemia; SOB Peripheral neuropathy; Coronary artery disease;shingles (MARCH 2009); Osteomyelitis; Chronic systolic heart failure ; Peripheral vascular disease; Inferior myocardial infarction ([place]) (1986); Pulmonary embolism; multiple myeloma (dx'd 04/2006); and Basal cell carcinoma. Pt admitted with odynophagia for 3 days and difficulty ambulating. Per chart episodes of spitting up and difficulty with swallowing. Per chart lost 10 pounds in the past month. Found to have acute on chronic renal failure and Diagnosed with candidal esophagitis. CXR 12/28/15 Poor inspiration with bibasilar atelectasis.   Assessment / Plan / Recommendation Clinical Impression  Pt expectorating mucous frequently throughout the day per pt/wife report and observed upon SLP arrival. Acute, reversible oropharyngeal dysphagia due to candida esophagitis evidenced by facial grimace, hesitation, effortful swallow and delayed throat clear. No significant difference between puree and thin liquid per pt subjective report and SLP observation. EGD planned for 2/13. SLP offered initiating clear thin liquid diet tonight (until midnight) versus remaining NPO except for ice. Pt preferred NPO with ice chips. ST will follow up next date after EGD completed.      Aspiration Risk  Moderate aspiration risk    Diet Recommendation Ice chips PRN after oral care;NPO        Other  Recommendations Oral Care Recommendations: Oral care QID   Follow up Recommendations  None    Frequency and Duration min 2x/week  2 weeks       Prognosis Prognosis for Safe Diet  Advancement: Good      Swallow Study   General HPI: Timothy [name] is a 82 y.o. male  has a past medical history of ASCVD ; Gout; Hypertension; Lumbar disc disease;  CHF (congestive heart failure) ; Heart murmur; Ischemic cardiomyopathy; Renal insufficiency; Peripheral neuropathy; Hyperlipidemia; SOB Peripheral neuropathy; Coronary artery disease;shingles (MARCH 2009); Osteomyelitis; Chronic systolic heart failure ; Peripheral vascular disease; Inferior myocardial infarction ([place]) (1986); Pulmonary embolism; multiple myeloma (dx'd 04/2006); and Basal cell carcinoma. Pt admitted with odynophagia for 3 days and difficulty ambulating. Per chart episodes of spitting up and difficulty with swallowing. Per chart lost 10 pounds in the past month. Found to have acute on chronic renal failure and Diagnosed with candidal esophagitis. CXR 12/28/15 Poor inspiration with bibasilar atelectasis. Type of Study: Bedside Swallow Evaluation Previous Swallow Assessment:  (none) Diet Prior to this Study:  (ice chips only) Temperature Spikes Noted: No Respiratory Status: Room air History of Recent Intubation: No Behavior/Cognition: Alert;Cooperative;Pleasant  mood Oral Cavity Assessment: Within Functional Limits Oral Care Completed by SLP: No Oral Cavity - Dentition: Adequate natural dentition Vision: Functional for self-feeding Self-Feeding Abilities: Able to feed self Patient Positioning: Upright in bed Baseline Vocal Quality: Normal Volitional Cough: Weak Volitional Swallow: Able to elicit    Oral/Motor/Sensory Function Overall Oral Motor/Sensory Function: Within functional limits   Ice Chips Ice chips: Not tested   Thin Liquid Thin Liquid: Impaired Presentation: Cup;Straw Pharyngeal  Phase Impairments: Throat Clearing - Delayed (effortful,grimace, pain)    Nectar Thick Nectar Thick Liquid: Not tested   Honey Thick Honey Thick Liquid: Not tested   Puree Puree: Impaired Presentation: Self  Fed;Spoon Pharyngeal Phase Impairments:  (effortful,grimace, pain)   Solid   GO   Solid: Not tested        Houston Siren 01/02/2016,5:04 PM [phone]

## 2016-01-03 ENCOUNTER — Encounter (HOSPITAL_COMMUNITY)
Admission: EM | Disposition: A | Discharge status: Home Health | Payer: Self-pay | Source: Home / Self Care | Attending: Internal Medicine

## 2016-01-03 ENCOUNTER — Inpatient Hospital Stay (HOSPITAL_COMMUNITY): Payer: Medicare Other | Admitting: Anesthesiology

## 2016-01-03 ENCOUNTER — Encounter (HOSPITAL_COMMUNITY): Payer: Self-pay

## 2016-01-03 DIAGNOSIS — B3781 Candidal esophagitis: Principal | ICD-10-CM

## 2016-01-03 DIAGNOSIS — C9000 Multiple myeloma not having achieved remission: Secondary | ICD-10-CM

## 2016-01-03 DIAGNOSIS — R339 Retention of urine, unspecified: Secondary | ICD-10-CM

## 2016-01-03 DIAGNOSIS — Z515 Encounter for palliative care: Secondary | ICD-10-CM | POA: Insufficient documentation

## 2016-01-03 HISTORY — PX: ESOPHAGOGASTRODUODENOSCOPY: SHX5428

## 2016-01-03 LAB — URINE CULTURE

## 2016-01-03 SURGERY — EGD (ESOPHAGOGASTRODUODENOSCOPY)
Anesthesia: Monitor Anesthesia Care | Laterality: Left

## 2016-01-03 MED ORDER — TAMSULOSIN HCL 0.4 MG PO CAPS
0.4000 mg | ORAL_CAPSULE | Freq: Two times a day (BID) | ORAL | Status: DC
  Administered 2016-01-03 – 2016-01-05 (×5): 0.4 mg via ORAL
  Filled 2016-01-03 (×9): qty 1

## 2016-01-03 MED ORDER — PROPOFOL 10 MG/ML IV BOLUS
INTRAVENOUS | Status: DC | PRN
  Administered 2016-01-03 (×2): 20 mg via INTRAVENOUS
  Administered 2016-01-03: 10 mg via INTRAVENOUS
  Administered 2016-01-03: 20 mg via INTRAVENOUS
  Administered 2016-01-03: 10 mg via INTRAVENOUS

## 2016-01-03 MED ORDER — PROPOFOL 10 MG/ML IV BOLUS
INTRAVENOUS | Status: AC
  Filled 2016-01-03: qty 20

## 2016-01-03 MED ORDER — SODIUM CHLORIDE 0.9 % IV SOLN
INTRAVENOUS | Status: DC
  Administered 2016-01-03: 10:00:00 via INTRAVENOUS

## 2016-01-03 MED ORDER — FENTANYL CITRATE (PF) 100 MCG/2ML IJ SOLN: 25.0000 ug | INTRAMUSCULAR | Status: DC | PRN

## 2016-01-03 NOTE — Progress Notes (Signed)
Bladder scan completed with 154 cc in bladder. Pt reports lots of discomfort and feeling like he needs to go but cant. Patient agreeable to in and out cath. In and out cath performed with 600 cc of urine removed. Patient tolerated procedure well. Reports feels much better.

## 2016-01-03 NOTE — Anesthesia Preprocedure Evaluation (Addendum)
Anesthesia Evaluation  Patient identified by MRN, date of birth, ID band Patient awake    Reviewed: Allergy & Precautions, H&P , NPO status , Patient's Chart, lab work & pertinent test results, reviewed documented beta blocker date and time   Airway Mallampati: II  TM Distance: >3 FB Neck ROM: full    Dental  (+) Dental Advidsory Given, Caps   Pulmonary shortness of breath and with exertion, former smoker,    Pulmonary exam normal breath sounds clear to auscultation       Cardiovascular Exercise Tolerance: Poor hypertension, Pt. on medications and Pt. on home beta blockers + CAD, + Past MI, + CABG, + Peripheral Vascular Disease and +CHF  Normal cardiovascular exam Rhythm:Regular Rate:Normal  '14 ECHO: EF 35-40%, valves OK   Neuro/Psych  Neuromuscular disease negative psych ROS   GI/Hepatic negative GI ROS, Neg liver ROS,   Endo/Other  negative endocrine ROS  Renal/GU Renal InsufficiencyRenal disease (creat 1.0 )  negative genitourinary   Musculoskeletal   Abdominal   Peds  Hematology negative hematology ROS (+) Blood dyscrasia (Hb 8.3), anemia , hgb 8.7   Anesthesia Other Findings Multiple myeloma  Reproductive/Obstetrics negative OB ROS                            Anesthesia Physical Anesthesia Plan  ASA: IV  Anesthesia Plan: MAC   Post-op Pain Management:    Induction:   Airway Management Planned:   Additional Equipment:   Intra-op Plan:   Post-operative Plan:   Informed Consent: I have reviewed the patients History and Physical, chart, labs and discussed the procedure including the risks, benefits and alternatives for the proposed anesthesia with the patient or authorized representative who has indicated his/her understanding and acceptance.   Dental Advisory Given  Plan Discussed with: CRNA  Anesthesia Plan Comments:         Anesthesia Quick Evaluation

## 2016-01-03 NOTE — Evaluation (Signed)
Pt in procedure. Will check on for OT eval later in day or next day.  [place], [place]

## 2016-01-03 NOTE — Progress Notes (Signed)
Timothy [name]   DOB:15-Mar-1928   OF#:[id number]   DPO#:[id number]  Subjective: Timothy [name] tolerated the EGD well today. It shows a diffuse esophagitis w/o stricture. Possibly what we are seeing is partly treated candidal esophagitis. He had been on nystatin PTA and of course on IV diflucan this admission. -- This evening he is feeling better, able to take cear liquids w/o difficulty. He remains very weak and at home was naping a good deal of the time per his wife. There has been also some slight episodes of confusion. Had a BM today, not hard. Wife in room   Objective: older White man examined in bed Filed Vitals:   01/03/16 1025 01/03/16 1430  BP: 130/69 131/68  Pulse: 105 100  Temp:  98 F (36.7 C)  Resp: 21 18    Body mass index is 25.11 kg/(m^2).  Intake/Output Summary (Last 24 hours) at 01/03/16 1814 Last data filed at 01/03/16 1402  Gross per 24 hour  Intake 1626.67 ml  Output   1150 ml  Net 476.67 ml     Sclerae unicshows a green tongue (eating green jello)-- no obvious thrush or other lesions  No peripheral adenopathy  Lungs clear -- auscultated anteriorlly  Heart regular rate and rhythm  Abdomen soft, +BS  MSK no focal spinal tenderness, no peripheral edema  Neuro nonfocal, alert and oriented x3 . CBG (last 3)  No results for input(s): GLUCAP in the last 72 hours.   Labs:  Lab Results  Component Value Date   WBC 11.3* 01/02/2016   HGB 8.7* 01/02/2016   HCT 27.6* 01/02/2016   MCV 84.4 01/02/2016   PLT 104* 01/02/2016   NEUTROABS 11.5* 01/01/2016    _0 @  Urine Studies No results for input(s): UHGB, CRYS in the last 72 hours.  Invalid input(s): UACOL, UAPR, USPG, UPH, UTP, UGL, UKET, UBIL, UNIT, UROB, ULEU, UEPI, UWBC, [place], [place], CAST, [place], [place]  Basic Metabolic Panel:  Recent Labs Lab 12/28/15 1010 01/01/16 1626 01/02/16 0640  NA 134* 141 143  K 4.0 4.1 3.7  CL  --  110 114*  CO2 19* 13* 16*  GLUCOSE 101 120* 103*  BUN 28.1* 83* 77*   CREATININE 1.6* 2.88* 2.20*  CALCIUM 8.3* 8.2* 8.0*  MG  --   --  2.3  PHOS  --   --  4.3   GFR Estimated Creatinine Clearance: 25.3 mL/min (by C-G formula based on Cr of 2.2). Liver Function Tests:  Recent Labs Lab 12/28/15 1010 12/28/15 1535 01/01/16 1626 01/02/16 0640  AST 104* 107* 109* 86*  ALT _1 ALKPHOS 198* 201* 303* 270*  BILITOT 0.55 0.7 0.8 0.7  PROT 7.4 7.1 7.6 7.0  ALBUMIN 3.0* 3.6 3.0* 2.7*   No results for input(s): LIPASE, AMYLASE in the last 168 hours. No results for input(s): AMMONIA in the last 168 hours. Coagulation profile No results for input(s): INR, PROTIME in the last 168 hours.  CBC:  Recent Labs Lab 12/28/15 1009 01/01/16 1626 01/02/16 0640  WBC 8.2 14.0* 11.3*  NEUTROABS 5.5 11.5*  --   HGB 9.3* 9.3* 8.7*  HCT 29.2* 28.9* 27.6*  MCV 82.7 83.3 84.4  PLT 94 Large platelets present* 102* 104*   Cardiac Enzymes: No results for input(s): CKTOTAL, CKMB, CKMBINDEX, TROPONINI in the last 168 hours. BNP: Invalid input(s): POCBNP CBG: No results for input(s): GLUCAP in the last 168 hours. D-Dimer No results for input(s): DDIMER in the last 72 hours. Hgb A1c No results  for input(s): HGBA1C in the last 72 hours. Lipid Profile No results for input(s): CHOL, HDL, LDLCALC, TRIG, CHOLHDL, LDLDIRECT in the last 72 hours. Thyroid function studies  Recent Labs  01/02/16 0640  TSH 4.373   Anemia work up No results for input(s): VITAMINB12, FOLATE, FERRITIN, TIBC, IRON, RETICCTPCT in the last 72 hours. Microbiology Recent Results (from the past 240 hour(s))  TECHNOLOGIST REVIEW     Status: None   Collection Time: 12/28/15 10:09 AM  Result Value Ref Range Status   Technologist Review   Final    Metas and Myelocytes present. few ovalos & teardrops  Culture, Urine     Status: None   Collection Time: 01/01/16  5:13 PM  Result Value Ref Range Status   Specimen Description URINE, CLEAN CATCH  Final   Special Requests NONE  Final    Culture   Final    MULTIPLE SPECIES PRESENT, SUGGEST RECOLLECTION Performed at [facility]    Report Status 01/03/2016 FINAL  Final      Studies:  No results found.  Assessment: 82 y.o. with kappa light chain multiple myeloma admitted 01/01/2016 with severe candidal esophagitis  (1) presenting April 2007 with anemia and renal failure; renal Bx showing free kappa light chain deposition; bone marrow biopsy showing 37% plasma cells, with normal cytogenetics and FISH;bone survey showing skull lytic lesions; treated with   (2) thalidomide (200 mg/d) and dexamethasone (40 mg/d x4d Q28d) June through Nov 2007, with good response, but poor tolerance;   (3) thalidomide decreased to 100 mg/d, dexamethasone continued, bortezomib (IV) added, Dec 2007 to March 2008   (4) treatment interrupted by multiple complications (peripheral neuropathy, pulmonary embolism, diverticular abscess, CN V zoster, severe DDD, congestive heart failure, rising PSA); maintenance zolendronic acid through March 2012   (5) progression April 2012, treated initially with dexamethasone alone, poorly tolerated;   (6) bortezomib (sq) resumed July 2012; cyclophosphamide and dexamethasone added Sept 2012; zolendronic acid changed to Q 66months; all treatments held as of March 2013 due to the development of the Left foot ulcer described above  (7) anemia, on aranesp December 2012 to August 2013; resumed Q14d starting May 2015, with slowly rising reticulocyte count  (8) status post Right trochanteric nail with proximal and distal interlock DePuy 11 mm one-pin lag screw, 44 mm distal interlock 08/09/2013 for a right comminuted intertrochanteric fracture  (9) status post removal of the first 2 toes left foot  (10) Left common femoral artery endarterectomy with vein patch angioplasty using left greater saphenous vein 03/24/2014  (11) status post fall with injury to the right hip, large hematoma, [facility] until 07/30/2014  (12) on 10/09/2014 starting cyclophosphamide at 300 mg/m every 14 days and dexamethasone 20 mg daily 2 days each week  (13) SIADH: On fluid restriction, reduced to 1.5L on 12/04/14 because of drop in sodium  (14) chronic systolic congestive heart failure, with EF 35% per echo, with history CAD s/p CABG 2002  (15) CKD stage III  (16) peripheral vascular disease, status post outpatient stage diamondback orbital rotational atherectomy along with PTCA and placement of Viabahn covered stent 06/03/2015 for high grade calcified prox L common illiac artery stenosis noted on cath 05/27/2015  (17) poor access: Status post right-sided PICC placement 07/06/2015 (a) dressing changes at [facility] every Tuesday, wife flushes PICC every Thurs and SAT     Plan:  As far as the candidal esophagitis is concerned Timothy [name] is eating a little better and hopefully he  can return home in the next 24-48 hours.  I had another discussion with his wife Timothy [name] and she understands we have < 6 months and possibly only 6-8 weeks because of D[place] multiple problems, even though the myeloma remains well controlled. Timothy [name] remains hopeful but at home he was increasingly sleepy and showing some confusion. He is certainly house-bound at this point and may soon be bed bound as well. She will need to enlist family as no one can be a caregiver three shifts 24/7  Timothy [name] will benefit from a [facility] referral to have his PICC dressing changed every Tuesday. That can be arranged at d/c.  We had placed an in home palliative care referral but that does not seem to have gone through. It should be re-requested as it will be a god bridge to full hospice support soon.  Concur with DNR.  Will follow with you   [name], MD 01/03/2016  6:14 PM Medical Oncology and Hematology [facility] [address] [place], [place] [address] Tel. [phone]    Fax. [phone]

## 2016-01-03 NOTE — Progress Notes (Signed)
PROGRESS NOTE  Timothy [name] OZY:[id number] DOB: 07/29/1928 DOA: 01/01/2016 PCP: [name], MD  Assessment/Plan: Candidal esophagitis (HCC) resulting in severe odynophagia presumed candidal esophagitis will treat with fluconazole IV, Seen by Dr. Amedeo Plenty-- EGD today -nystatin swish and spit -symptoms improving  Dehydration  -IVF  Multiple myeloma ([place]) - as per oncology note has been fairly stable Will attempt to get palliative care consult here as Dr. Griffith Citron was arranging at home  Hypertension chronic unable to tolerate medications secondary to pain as swallowing. We'll continue beta blocker IV withholding parameters   Urinary retention -bladder scan-may need foley if unable to void -has not been able to take flomax BID x 2 weeks  Anemia of chronic disease stable continue to monitor current asymptomatic   Chronic systolic CHF (congestive heart failure), NYHA class 2 (HCC) avoid fluid overload but currently appears to be dehydrated we'll continue to hold Lasix   Acute on chronic renal failure (HCC) hold lisinopril. Likely secondary to dehydration. IVF  bacteria in urine - no evidence of UTI clinically will hold off on antibiotics for now and await results of urine culture- may need to treat now that has urinary retention  Labs in AM  Code Status: DNR Family Communication: patient/wife at bedside Disposition Plan:    Consultants:  GI  Palliative care  Procedures:  HPI/Subjective: Mouth feels better   Objective: Filed Vitals:   01/03/16 0200 01/03/16 0542  BP: 156/70 150/85  Pulse: 105 101  Temp: 97.5 F (36.4 C) 98.9 F (37.2 C)  Resp: 20 18    Intake/Output Summary (Last 24 hours) at 01/03/16 0858 Last data filed at 01/03/16 0600  Gross per 24 hour  Intake 1509.38 ml  Output    800 ml  Net 709.38 ml   Filed Weights   01/01/16 2130  Weight: 79.379 kg (175 lb)    Exam:   General:  Awake, chronically ill appearing  Cardiovascular:  rrr  Respiratory: clear  Abdomen: +BS, soft  Musculoskeletal: no edema   Data Reviewed: Basic Metabolic Panel:  Recent Labs Lab 12/28/15 1010 01/01/16 1626 01/02/16 0640  NA 134* 141 143  K 4.0 4.1 3.7  CL  --  110 114*  CO2 19* 13* 16*  GLUCOSE 101 120* 103*  BUN 28.1* 83* 77*  CREATININE 1.6* 2.88* 2.20*  CALCIUM 8.3* 8.2* 8.0*  MG  --   --  2.3  PHOS  --   --  4.3   Liver Function Tests:  Recent Labs Lab 12/28/15 1010 12/28/15 1535 01/01/16 1626 01/02/16 0640  AST 104* 107* 109* 86*  ALT _0 ALKPHOS 198* 201* 303* 270*  BILITOT 0.55 0.7 0.8 0.7  PROT 7.4 7.1 7.6 7.0  ALBUMIN 3.0* 3.6 3.0* 2.7*   No results for input(s): LIPASE, AMYLASE in the last 168 hours. No results for input(s): AMMONIA in the last 168 hours. CBC:  Recent Labs Lab 12/28/15 1009 01/01/16 1626 01/02/16 0640  WBC 8.2 14.0* 11.3*  NEUTROABS 5.5 11.5*  --   HGB 9.3* 9.3* 8.7*  HCT 29.2* 28.9* 27.6*  MCV 82.7 83.3 84.4  PLT 94 Large platelets present* 102* 104*   Cardiac Enzymes: No results for input(s): CKTOTAL, CKMB, CKMBINDEX, TROPONINI in the last 168 hours. BNP (last 3 results) No results for input(s): BNP in the last 8760 hours.  ProBNP (last 3 results) No results for input(s): PROBNP in the last 8760 hours.  CBG: No results for input(s): GLUCAP in the last 168  hours.  Recent Results (from the past 240 hour(s))  TECHNOLOGIST REVIEW     Status: None   Collection Time: 12/28/15 10:09 AM  Result Value Ref Range Status   Technologist Review   Final    Metas and Myelocytes present. few ovalos & teardrops     Studies: No results found.  Scheduled Meds: . fluconazole (DIFLUCAN) IV  200 mg Intravenous Q24H  . metoprolol  2.5 mg Intravenous 4 times per day  . mirtazapine  15 mg Oral QHS  . nystatin  5 mL Oral QID  . pantoprazole (PROTONIX) IV  40 mg Intravenous Q12H   Continuous Infusions: . sodium chloride    . dextrose 5 % and 0.45% NaCl 50 mL/hr at  01/02/16 1350   Antibiotics Given (last 72 hours)    None      Active Problems:   Multiple myeloma (HCC)   Hypertension   Hypokalemia   Anemia of chronic disease   Dehydration   Chronic systolic CHF (congestive heart failure), NYHA class 2 ([place])   Acute on chronic renal failure ([place])   Candidal esophagitis ([place])   Odynophagia    Time spent: 35 min    [facility] Pager [phone]. If 7PM-7AM, please contact night-coverage at [email or web address], password [facility] 01/03/2016, 8:58 AM  LOS: 2 days

## 2016-01-03 NOTE — Anesthesia Postprocedure Evaluation (Signed)
Anesthesia Post Note  Patient: Timothy [name]  Procedure(s) Performed: Procedure(s) (LRB): ESOPHAGOGASTRODUODENOSCOPY (EGD) (Left)  Patient location during evaluation: PACU Anesthesia Type: MAC Level of consciousness: awake and alert Pain management: pain level controlled Vital Signs Assessment: post-procedure vital signs reviewed and stable Respiratory status: spontaneous breathing, nonlabored ventilation, respiratory function stable and patient connected to nasal cannula oxygen Cardiovascular status: blood pressure returned to baseline and stable Postop Assessment: no signs of nausea or vomiting Anesthetic complications: no    Last Vitals:  Filed Vitals:   01/03/16 1020 01/03/16 1025  BP: 128/59 130/69  Pulse: 88 105  Temp:    Resp: 17 21    Last Pain:  Filed Vitals:   01/03/16 1029  PainSc: Asleep                 [place] Dieudonne L

## 2016-01-03 NOTE — Op Note (Signed)
[facility] [place], [address]   ENDOSCOPY PROCEDURE REPORT  PATIENT: Timothy [name], Timothy [name]  MR#: [id number] BIRTHDATE: 05/04/1928 , 34  yrs. old GENDER: male ENDOSCOPIST: Teena Irani, MD REFERRED BY: PROCEDURE DATE:  01/16/16 PROCEDURE: ASA CLASS: INDICATIONS:  dynophagia MEDICATIONS: mATC TOPICAL ANESTHETIC: none  DESCRIPTION OF PROCEDURE: After the risks benefits and alternatives of the procedure were thoroughly explained, informed consent was obtained.  The    endoscope was introduced through the mouth and advanced to the second portion of the duodenum , Without limitations.  The instrument was slowly withdrawn as the mucosa was fully examined. Estimated blood loss is zero unless otherwise noted in this procedure report.    sophagus very mild diffuse esophagitis with edema scattered white exudate no definiteerosions or ulcerations, no classic monilial plaques, no ring or stricture. Stomach and duodenum were normal.              The scope was then withdrawn from the patient and the procedure completed.  COMPLICATIONS: There were no immediate complications.  ENDOSCOPIC IMPRESSION: very mild nonspecific diffuse esophagitis with no specific focal lesion or stricture  RECOMMENDATIONS: continue PPI andantifungal therapy. Advance diet as tolerated   REPEAT EXAM:  eSigned:  Teena Irani, MD January 16, 2016 10:05 AM    CC:  CPT CODES: ICD CODES:  The ICD and CPT codes recommended by this software are interpretations from the data that the clinical staff has captured with the software.  The verification of the translation of this report to the ICD and CPT codes and modifiers is the sole responsibility of the health care institution and practicing physician where this report was generated.  [employer] will not be held responsible for the validity of the ICD and CPT codes included on this report.  AMA assumes no liability  for data contained or not contained herein. CPT is a Designer, television/film set of the [employer].  PATIENT NAME:  [name], [name] MR#: [id number]

## 2016-01-03 NOTE — Consult Note (Signed)
Consultation Note Date: 01/03/2016   Patient Name: Timothy [name]  DOB: July 24, 1928  MRN: [id number]  Age / Sex: 82 y.o., male  PCP: [name], MD Referring Physician: Geradine Girt, DO  Reason for Consultation: outpatient palliative care alignment, goals of care.     Clinical Assessment/Narrative:  Patient is a 82 yo gentleman with a past medical history significant for multiple myeloma, history of chronic systolic congestive heart failure. Patient follows with Dr. [name] and also follows with cardiology.  The patient has been admitted with candidal esophagitis. He was suffering from severe odynophagia at home. He was getting weak. He was able to use a walker and had planned up using a wheelchair just prior to this hospitalization. He was seen by cardiology as well as oncology in the outpatient setting. On 2-13 he underwent a esophagogastroduodenoscopy showing mild esophagitis. Antifungal medications are continuing for presumed Candida esophagitis with fluconazole IV and nystatin swish and spit.   Patient is awake alert resting in bed. Wife is present at the bedside. Introduced scope of palliative services. Patient was recommended home-based palliative care by his oncologist. Completely agree with that recommendation. CODE STATUS is DO NOT RESUSCITATE. Also discussed about home health care. Patient has a PICC line in wife is asking about dressing changes for the PICC line.   Patient does not complain of any pain, denies any nausea or vomiting. Has tolerated soft foods well after his endoscopy earlier today.   Contacts/Participants in Discussion: Primary Decision Maker:   Relationship to Patient   HCPOA: yes  Wife Timothy [name].   SUMMARY OF RECOMMENDATIONS: Agree with DNR  Agree with initiation of home based palliative care through [place] Also recommend home health care on D/C Continue current  treatment Encourage PO   Code Status/Advance Care Planning: DNR    Code Status Orders        Start     Ordered   01/01/16 2146  Do not attempt resuscitation (DNR)   Continuous    Question Answer Comment  In the event of cardiac or respiratory ARREST Do not call a "code blue"   In the event of cardiac or respiratory ARREST Do not perform Intubation, CPR, defibrillation or ACLS   In the event of cardiac or respiratory ARREST Use medication by any route, position, wound care, and other measures to relive pain and suffering. May use oxygen, suction and manual treatment of airway obstruction as needed for comfort.      01/01/16 2146    Code Status History    Date Active Date Inactive Code Status Order ID Comments User Context   06/03/2015 12:21 PM 06/04/2015  5:49 PM Full Code [id number]  Lorretta Harp, MD Inpatient   05/27/2015  1:09 PM 05/27/2015 10:54 PM Full Code [id number]  Lorretta Harp, MD Inpatient   07/06/2014 12:05 PM 07/10/2014  4:55 PM Full Code [id number]  Flonnie Overman Dhungel, MD Inpatient   03/24/2014  1:21 PM 03/29/2014  3:06 PM Full Code [id number]  Gabriel Earing, PA-C Inpatient   03/23/2014  9:59 AM 03/24/2014  1:21 PM Full Code [id number]  Lorretta Harp, MD Inpatient   08/21/2013  5:02 PM 09/05/2013  2:16 PM Full Code [id number]  Bary Leriche, PA-C Inpatient   08/17/2013 10:49 PM 08/21/2013  5:02 PM Full Code [id number]  Marybelle Killings, MD Inpatient   03/10/2012  6:59 AM 03/16/2012  4:15 PM Full Code [id number]  Joellen Jersey, RN Inpatient   01/13/2012  12:18 PM 01/15/2012  6:19 PM Full Code [id number]  Samuella Cota, MD Inpatient    Advance Directive Documentation        Most Recent Value   Type of Advance Directive  Healthcare Power of Attorney, Living will   Pre-existing out of facility DNR order (yellow form or pink MOST form)     "MOST" Form in Place?        Other Directives:Advanced Directive  Symptom Management:    continue current PO regimen.   Palliative  Prophylaxis:   Delirium Protocol   Psycho-social/Spiritual:  Support System: [place] Desire for further Chaplaincy support:no Additional Recommendations: Caregiving  Support/Resources  Prognosis: Unable to determine  Discharge Planning: Home with Home Health and home based palliative care through [facility].    Chief Complaint/ Primary Diagnoses: Present on Admission:  . Dehydration . Multiple myeloma ([place]) . Hypertension . Hypokalemia . Anemia of chronic disease . Chronic systolic CHF (congestive heart failure), NYHA class 2 ([place]) . Acute on chronic renal failure ([place]) . Candidal esophagitis ([place])  I have reviewed the medical record, interviewed the patient and family, and examined the patient. The following aspects are pertinent.  Past Medical History  Diagnosis Date  . ASCVD (arteriosclerotic cardiovascular disease)   . Gout   . Hypertension   . Lumbar disc disease     post laminectomy  . History of echocardiogram 11/02/2010    EF range of 30 to 35% / There is hypokinsesis of the basal-mild inferolateral myocardium  . CHF (congestive heart failure) ([place])   . Heart murmur   . Ischemic cardiomyopathy   . Renal insufficiency   . Peripheral neuropathy ([place])   . Hyperlipidemia   . SOB (shortness of breath)   . Fatigue   . Peripheral neuropathy ([place])   . Coronary artery disease     CABG 2002 by Dr. Roxy Manns with LIMA to LAD, SVG to intermediate, SVG to LCX, & SVG to PL. Last nuclear in 2012 showing large inferior scar with EF of 39%.   . Blood transfusion 10/2011; 07/2013    "multiple myeloma; when I broke my leg"  . History of shingles MARCH 2009    SHINGLE LESIONS WERE AROUND RIGHT EYE--PT HAS RESIDUAL ITCHING AROUND THE EYE.  . Osteomyelitis ([place])     s/p toe amputation  . Chronic systolic heart failure (HCC)     EF of 30% per echo 02/2012  . Protein malnutrition ([place])   . Critical lower limb ischemia   . Peripheral vascular disease ([place])   . Hx of cardiovascular stress  test     Lexiscan Myoview (1/16):  Large inferior and apical scar; no ischemia, EF 27%;  High Risk  . Inferior myocardial infarction ([place]) 1986  . Pulmonary embolism ([place])     "when I had big toe amputated"  . Chronic anemia     "due to the multiple myeloma"  . Chronic lower back pain   . Multiple myeloma ([place]) dx'd 04/2006    with recurrence of increasing problems  DR. Harold -ONCOLOGIST.  PT HAS BEEN OFF CHEMO SINCE HIS HOSPITALIZATION FEB 2013 FOR LEFT FOOT INFECTION  . Basal cell carcinoma     "chest left arm"   Social History   Social History  . Marital Status: Married    Spouse Name: N/A  . Number of Children: N/A  . Years of Education: N/A   Social History Main Topics  . Smoking status: Former Smoker -- 1.00 packs/day for 20 years  Types: Cigarettes    Quit date: 11/20/1984  . Smokeless tobacco: Never Used  . Alcohol Use: Yes     Comment: 06/03/2015 "might have a drink a few times/year"  . Drug Use: No  . Sexual Activity: Not Currently   Other Topics Concern  . None   Social History Narrative   Lives at home with wife, has children but not with current wife. Still active, golfing and walking in the yard before the issue with the wound, now using a walker.    Family History  Problem Relation Age of Onset  . Heart attack Mother   . Heart disease Father    Scheduled Meds: . fluconazole (DIFLUCAN) IV  200 mg Intravenous Q24H  . metoprolol  2.5 mg Intravenous 4 times per day  . mirtazapine  15 mg Oral QHS  . nystatin  5 mL Oral QID  . pantoprazole (PROTONIX) IV  40 mg Intravenous Q12H  . tamsulosin  0.4 mg Oral BID   Continuous Infusions:  PRN Meds:.acetaminophen **OR** acetaminophen, HYDROcodone-acetaminophen, HYDROmorphone (DILAUDID) injection, ondansetron **OR** ondansetron (ZOFRAN) IV Medications Prior to Admission:  Prior to Admission medications   Medication Sig Start Date End Date Taking? Authorizing Provider  aspirin 81 MG chewable tablet Chew 1  tablet (81 mg total) by mouth daily. 06/04/15  Yes Almyra Deforest, PA  carvedilol (COREG) 6.25 MG tablet TAKE 1 TABLET TWICE A DAY WITH MEALS 09/01/15  Yes Burtis Junes, NP  dexamethasone (DECADRON) 4 MG tablet Take 1 tablet (4 mg total) by mouth 2 (two) times daily with a meal. Patient taking differently: Take 4 mg by mouth daily.  12/30/15  Yes [name], MD  Heparin Lock Flush (HEPARIN FLUSH, PORCINE,) 100 UNIT/ML injection 2.56m ( 250 ) per picc line flush. Use 1 syringe per port. 07/06/15  Yes GChauncey Cruel MD  HYDROmorphone (DILAUDID) 4 MG tablet Take 1 tablet (4 mg total) by mouth every 4 (four) hours as needed for severe pain. 12/20/15  Yes GChauncey Cruel MD  Ibuprofen-Diphenhydramine HCl (ADVIL PM) 200-25 MG CAPS Take 2 capsules by mouth at bedtime as needed (sleep).   Yes Historical Provider, MD  isosorbide mononitrate (IMDUR) 30 MG 24 hr tablet Take 30 mg by mouth at bedtime.  12/30/15  Yes GChauncey Cruel MD  lisinopril (PRINIVIL,ZESTRIL) 20 MG tablet TAKE 1 TABLET DAILY 04/23/15  Yes Peter M JMartinique MD  mirtazapine (REMERON) 15 MG tablet Take 1 tablet (15 mg total) by mouth at bedtime. 12/30/15  Yes GChauncey Cruel MD  mirtazapine (REMERON) 15 MG tablet Take 15 mg by mouth at bedtime.   Yes Historical Provider, MD  Multiple Vitamin (MULTIVITAMIN) capsule Take 1 capsule by mouth every morning.    Yes Historical Provider, MD  nitroGLYCERIN (NITROSTAT) 0.4 MG SL tablet Place 1 tablet (0.4 mg total) under the tongue every 5 (five) minutes as needed for chest pain. 09/03/14  Yes LBurtis Junes NP  nystatin (MYCOSTATIN) 100000 UNIT/ML suspension Take 5 mLs (500,000 Units total) by mouth 4 (four) times daily. 12/31/15  Yes GChauncey Cruel MD  rOPINIRole (REQUIP) 3 MG tablet Take 1 tablet (3 mg total) by mouth at bedtime. 10/11/15  Yes MTrula Slade DPM  sodium chloride 0.9 % SOLN 250 mL with cyclophosphamide 1 G SOLR 300 mg/m2 Inject 300 mg/m2 into the vein every 21 (  twenty-one) days.    Yes Historical Provider, MD  Sodium Chloride Flush 0.9 % SOLN injection Inject 10 mLs into  the vein as directed. 11/09/15  Yes [name], MD  tamsulosin (FLOMAX) 0.4 MG CAPS capsule Take 0.4 mg by mouth 2 (two) times daily.    Yes Historical Provider, MD  vitamin C (ASCORBIC ACID) 500 MG tablet Take 250 mg by mouth 2 (two) times daily.    Yes Historical Provider, MD  B Complex-Biotin-FA (B COMPLETE PO) Take 1 tablet by mouth daily.     Historical Provider, MD  darbepoetin (ARANESP) 200 MCG/0.4ML SOLN injection Inject 200 mcg into the skin as directed.    Historical Provider, MD  diphenoxylate-atropine (LOMOTIL) 2.5-0.025 MG tablet Take 1 tablet by mouth 4 (four) times daily as needed for diarrhea or loose stools. 09/27/15   [name], MD  furosemide (LASIX) 40 MG tablet TAKE 1 TABLET DAILY Patient not taking: Reported on 01/01/2016 10/11/15   Peter M Martinique, MD  polyethylene glycol [facility] Floria Raveling) packet Take 17 g by mouth daily as needed. 09/05/13   Bary Leriche, PA-C   Allergies  Allergen Reactions  . Morphine And Related Other (See Comments)    Agitated with morphine drip    Review of Systems Difficulty swallowing and walking at home.   Physical Exam Weak pale elderly gentleman S1 S2 Clear Abdomen soft No edema Non focal  Vital Signs: BP 130/69 mmHg  Pulse 105  Temp(Src) 97.6 F (36.4 C) (Oral)  Resp 21  Ht '5\' 10"'$  (1.778 m)  Wt 79.379 kg (175 lb)  BMI 25.11 kg/m2  SpO2 99%  SpO2: SpO2: 99 % O2 Device:SpO2: 99 % O2 Flow Rate: .O2 Flow Rate (L/min): 3 L/min  IO: Intake/output summary:  Intake/Output Summary (Last 24 hours) at 01/03/16 1526 Last data filed at 01/03/16 1402  Gross per 24 hour  Intake 1626.67 ml  Output   1250 ml  Net 376.67 ml    LBM: Last BM Date: 12/31/15 Baseline Weight: Weight: 79.379 kg (175 lb) Most recent weight: Weight: 79.379 kg (175 lb)      Palliative Assessment/Data:  Flowsheet Rows         Most Recent Value   Intake Tab    Referral Department  Hospitalist   Unit at Time of Referral  Med/Surg Unit   Palliative Care Primary Diagnosis  Cancer   Date Notified  01/02/16   Palliative Care Type  New Palliative care   Reason for referral  Non-pain Symptom, Clarify Goals of Care   Date of Admission  01/01/16   Date first seen by Palliative Care  01/03/16   # of days Palliative referral response time  1 Day(s)   # of days IP prior to Palliative referral  1   Clinical Assessment    Palliative Performance Scale Score  30%   Pain Max last 24 hours  4   Pain Min Last 24 hours  3   Dyspnea Max Last 24 Hours  3   Dyspnea Min Last 24 hours  2   Psychosocial & Spiritual Assessment    Palliative Care Outcomes    Patient/Family meeting held?  Yes   Who was at the meeting?  patient wife    Palliative Care Outcomes  Clarified goals of care   Palliative Care follow-up planned  Yes, Facility      Additional Data Reviewed:  CBC:    Component Value Date/Time   WBC 11.3* 01/02/2016 0640   WBC 8.2 12/28/2015 1009   HGB 8.7* 01/02/2016 0640   HGB 9.3* 12/28/2015 1009   HCT 27.6*  01/02/2016 0640   HCT 29.2* 12/28/2015 1009   PLT 104* 01/02/2016 0640   PLT 94 Large platelets present* 12/28/2015 1009   MCV 84.4 01/02/2016 0640   MCV 82.7 12/28/2015 1009   NEUTROABS 11.5* 01/01/2016 1626   NEUTROABS 5.5 12/28/2015 1009   LYMPHSABS 1.7 01/01/2016 1626   LYMPHSABS 1.6 12/28/2015 1009   MONOABS 0.7 01/01/2016 1626   MONOABS 0.7 12/28/2015 1009   EOSABS 0.0 01/01/2016 1626   EOSABS 0.2 12/28/2015 1009   BASOSABS 0.1 01/01/2016 1626   BASOSABS 0.2* 12/28/2015 1009   Comprehensive Metabolic Panel:    Component Value Date/Time   NA 143 01/02/2016 0640   NA 134* 12/28/2015 1010   K 3.7 01/02/2016 0640   K 4.0 12/28/2015 1010   CL 114* 01/02/2016 0640   CL 98 05/12/2013 1048   CO2 16* 01/02/2016 0640   CO2 19* 12/28/2015 1010   BUN 77* 01/02/2016 0640   BUN 28.1* 12/28/2015 1010    CREATININE 2.20* 01/02/2016 0640   CREATININE 1.6* 12/28/2015 1010   CREATININE 1.32* 12/22/2015 0920   GLUCOSE 103* 01/02/2016 0640   GLUCOSE 101 12/28/2015 1010   GLUCOSE 85 05/12/2013 1048   CALCIUM 8.0* 01/02/2016 0640   CALCIUM 8.3* 12/28/2015 1010   AST 86* 01/02/2016 0640   AST 104* 12/28/2015 1010   ALT 28 01/02/2016 0640   ALT 27 12/28/2015 1010   ALKPHOS 270* 01/02/2016 0640   ALKPHOS 198* 12/28/2015 1010   BILITOT 0.7 01/02/2016 0640   BILITOT 0.55 12/28/2015 1010   PROT 7.0 01/02/2016 0640   PROT 7.4 12/28/2015 1010   ALBUMIN 2.7* 01/02/2016 0640   ALBUMIN 3.0* 12/28/2015 1010     Time In: 1400 Time Out: 1500 Time Total: 60 min  Greater than 50%  of this time was spent counseling and coordinating care related to the above assessment and plan.  Signed by: Loistine Chance, MD [place], MD  01/03/2016, 3:26 PM  Please contact Palliative Medicine Team phone at [phone] for questions and concerns.

## 2016-01-03 NOTE — Clinical Documentation Improvement (Signed)
Hospitalist Please update your documentation within the medical record to reflect your response to this query. Thank you Can the diagnosis of CKD be further specified?   CKD Stage I - GFR greater than or equal to 90  CKD Stage II - GFR 60-89  CKD Stage III - GFR 30-59  CKD Stage IV - GFR 15-29  CKD Stage V - GFR < 15  ESRD (End Stage Renal Disease)  Other condition  Unable to clinically determine  Supporting Information: : (risk factors, signs and symptoms, diagnostics, treatment) 01/03/16 progr note.Marland KitchenMarland Kitchen"Acute on chronic renal failure (HCC) hold lisinopril. Likely secondary to dehydration. IVF.".Marland KitchenMarland Kitchen Results for Timothy [name], Timothy [name] (MRN [id number]) as of 01/03/2016 10:11  01/01/2016 16:26 01/02/2016 06:40  BUN 83 (H) 77 (H)  Creatinine 2.88 (H) 2.20 (H)  EGFR (Non-African Amer.) 18 (L) 26 (L)   Please exercise your independent, professional judgment when responding. A specific answer is not anticipated or expected.  Thank You, Ermelinda Das, RN, BSN, [place] Certified Clinical Documentation Specialist [place]: Health Information Management [phone]

## 2016-01-03 NOTE — Transfer of Care (Signed)
Immediate Anesthesia Transfer of Care Note  Patient: Timothy [name]  Procedure(s) Performed: Procedure(s): ESOPHAGOGASTRODUODENOSCOPY (EGD) (Left)  Patient Location: PACU  Anesthesia Type:MAC  Level of Consciousness: sedated  Airway & Oxygen Therapy: Patient Spontanous Breathing and Patient connected to nasal cannula oxygen  Post-op Assessment: Report given to RN and Post -op Vital signs reviewed and stable  Post vital signs: Reviewed and stable  Last Vitals:  Filed Vitals:   01/03/16 0542 01/03/16 0919  BP: 150/85 159/66  Pulse: 101 99  Temp: 37.2 C 36.4 C  Resp: 18 19    Complications: No apparent anesthesia complications

## 2016-01-04 ENCOUNTER — Encounter (HOSPITAL_COMMUNITY): Payer: Self-pay | Admitting: Gastroenterology

## 2016-01-04 LAB — BASIC METABOLIC PANEL
Anion gap: 13 (ref 5–15)
BUN: 43 mg/dL — ABNORMAL HIGH (ref 6–20)
CO2: 17 mmol/L — ABNORMAL LOW (ref 22–32)
Calcium: 8.7 mg/dL — ABNORMAL LOW (ref 8.9–10.3)
Chloride: 119 mmol/L — ABNORMAL HIGH (ref 101–111)
Creatinine, Ser: 1.7 mg/dL — ABNORMAL HIGH (ref 0.61–1.24)
GFR calc Af Amer: 41 mL/min — ABNORMAL LOW (ref >60)
GFR calc non Af Amer: 35 mL/min — ABNORMAL LOW (ref >60)
Glucose, Bld: 107 mg/dL — ABNORMAL HIGH (ref 65–99)
Potassium: 3.6 mmol/L (ref 3.5–5.1)
Sodium: 149 mmol/L — ABNORMAL HIGH (ref 135–145)

## 2016-01-04 LAB — CBC
HCT: 30.5 % — ABNORMAL LOW (ref 39.0–52.0)
Hemoglobin: 9.3 g/dL — ABNORMAL LOW (ref 13.0–17.0)
MCH: 26.3 pg (ref 26.0–34.0)
MCHC: 30.5 g/dL (ref 30.0–36.0)
MCV: 86.2 fL (ref 78.0–100.0)
Platelets: 68 10*3/uL — ABNORMAL LOW (ref 150–400)
RBC: 3.54 MIL/uL — ABNORMAL LOW (ref 4.22–5.81)
RDW: 20.8 % — ABNORMAL HIGH (ref 11.5–15.5)
WBC: 11.9 10*3/uL — ABNORMAL HIGH (ref 4.0–10.5)

## 2016-01-04 MED ORDER — HYDROMORPHONE HCL 1 MG/ML IJ SOLN
0.5000 mg | Freq: Once | INTRAMUSCULAR | Status: AC
  Administered 2016-01-04: 0.5 mg via INTRAVENOUS

## 2016-01-04 MED ORDER — ISOSORBIDE MONONITRATE ER 30 MG PO TB24
30.0000 mg | ORAL_TABLET | Freq: Every day | ORAL | Status: DC
  Administered 2016-01-04: 30 mg via ORAL
  Filled 2016-01-04: qty 1

## 2016-01-04 MED ORDER — HYDROMORPHONE HCL 4 MG PO TABS
4.0000 mg | ORAL_TABLET | ORAL | Status: DC | PRN
  Filled 2016-01-04: qty 1

## 2016-01-04 MED ORDER — PANTOPRAZOLE SODIUM 40 MG PO TBEC
40.0000 mg | DELAYED_RELEASE_TABLET | Freq: Two times a day (BID) | ORAL | Status: DC
  Administered 2016-01-04 – 2016-01-05 (×3): 40 mg via ORAL
  Filled 2016-01-04 (×4): qty 1

## 2016-01-04 MED ORDER — FLUCONAZOLE 100 MG PO TABS
200.0000 mg | ORAL_TABLET | Freq: Every day | ORAL | Status: DC
  Administered 2016-01-04: 200 mg via ORAL
  Filled 2016-01-04: qty 2

## 2016-01-04 MED ORDER — HYDROMORPHONE HCL 1 MG/ML IJ SOLN
INTRAMUSCULAR | Status: AC
  Administered 2016-01-04: 0.5 mg via INTRAVENOUS
  Filled 2016-01-04: qty 1

## 2016-01-04 MED ORDER — CARVEDILOL 6.25 MG PO TABS
6.2500 mg | ORAL_TABLET | Freq: Two times a day (BID) | ORAL | Status: DC
  Administered 2016-01-04 – 2016-01-05 (×2): 6.25 mg via ORAL
  Filled 2016-01-04 (×2): qty 1

## 2016-01-04 MED ORDER — HYDROMORPHONE HCL 1 MG/ML IJ SOLN
0.5000 mg | INTRAMUSCULAR | Status: DC | PRN
  Administered 2016-01-04 – 2016-01-05 (×3): 0.5 mg via INTRAVENOUS
  Filled 2016-01-04 (×3): qty 1

## 2016-01-04 NOTE — Progress Notes (Signed)
OT Cancellation Note  Patient Details Name: SHOMARI MCGLONE MRN: [id number] DOB: [birth year]   Cancelled Treatment:    Reason Eval/Treat Not Completed: Pain limiting ability to participate.  Checked back and pt was waiting for pain medication.  He has a BSC at home. Will return tomorrow.  Stewart Pimenta 01/04/2016, 3:33 PM  Lesle Chris, OTR/L [phone] 01/04/2016

## 2016-01-04 NOTE — Care Management Important Message (Signed)
Important Message  Patient Details  Name: Timothy [name] MRN: [id number] Date of Birth: [birth year]   Medicare Important Message Given:  Yes    Camillo Flaming 01/04/2016, 11:17 AMImportant Message  Patient Details  Name: Timothy [name] MRN: [id number] Date of Birth: 12/18/1927   Medicare Important Message Given:  Yes    Camillo Flaming 01/04/2016, 11:17 AM

## 2016-01-04 NOTE — Progress Notes (Signed)
Patient refused long acting pain meds- prefers IV route while in hospital Timothy Bear DO

## 2016-01-04 NOTE — Progress Notes (Signed)
OT Cancellation Note  Patient Details Name: Timothy [name] MRN: [id number] DOB: 03-25-1928   Cancelled Treatment:    Reason Eval/Treat Not Completed: Fatigue/lethargy limiting ability to participate.  Pt just finished bathing with NT and fell asleep at end of this task.   Will try to check back later today.  Abaigeal Moomaw 01/04/2016, 12:30 PM  Lesle Chris, OTR/L [phone] 01/04/2016

## 2016-01-04 NOTE — Progress Notes (Signed)
Patient suffers from weakness/multiple myeloma which impairs their ability to perform daily activities like dressing in the home. A cane will not resolve  issue with performing activities of daily living. A wheelchair will allow patient to safely perform daily activities. Patient can safely propel the wheelchair in the home or has a caregiver who can provide assistance.  Accessories: elevating leg rests (ELRs), wheel locks, extensions and anti-tippers.

## 2016-01-04 NOTE — Care Management Note (Signed)
Case Management Note  Patient Details  Name: Timothy [name] MRN: [id number] Date of Birth: Dec 30, 1927  Subjective/Objective:           82 yo admitted with Dehydration. Hx of Multiple Myeloma         Action/Plan: Pt is from home with wife. Wife states that they have lots of family support at home.  Expected Discharge Date:  01/05/16               Expected Discharge Plan:  [place]  In-House Referral:     Discharge planning Services  CM Consult  Post Acute Care Choice:    Choice offered to:     DME Arranged:  Wheelchair manual DME Agency:  [place] Arranged:  Disease Management [place] Agency:  [facility]  Status of Service:  In process, will continue to follow  Medicare Important Message Given:  Yes Date Medicare IM Given:    Medicare IM give by:    Date Additional Medicare IM Given:    Additional Medicare Important Message give by:     If discussed at [place] of Stay Meetings, dates discussed:    Additional Comments: This CM was asked to refer pt to the Palliative Program at [facility]. This CM met with pt and wife at bedside to discuss DC plans.  They did confirm that pt would like to be part of the Palliative Program at [facility]. Referral called to [facility] and they informed this CM that pt has already been referred to them and would be following him at discharge.  Wheelchair ordered and [facility] DME rep contacted.  Pt wife unsure if she would like HHRN/PT at this time.  She would like to be sure Picc Line is needed or if it can be removed. She states she has been flushing the picc line twice a week and gone to the Cancer center weekly for dressing changes. They have used [facility] in the past and would use them again if needed.  CM will continue to follow and assist with DC needs.  Lynnell Catalan, RN 01/04/2016, 2:30 PM

## 2016-01-04 NOTE — Progress Notes (Signed)
PROGRESS NOTE  Timothy [name]:[id number] DOB: 02-14-1928 DOA: 01/01/2016 PCP: [name], MD  Presented with worsening overall performance status and odynophagia for 3 days. Patient started to have trouble ambulating have had increased unsteadiness. He has had decreased by mouth intake no appetite he has had some episodes of spitting up and difficulty with swallowing. He has had significant hand when he tries to swallow anything yesterday he got a prescription for nystatin swish and swallow . He has lost some weight about 10 pounds in the past month Patient reports low back pain. Reports some shortness of breath increased with exertion. No leg swelling. He has not had any nausea or vomiting or fever.   Assessment/Plan: Candidal esophagitis (HCC) resulting in severe odynophagia presumed candidal esophagitis will treat with fluconazole IV, EGD shows very mild nonspecific diffuse esophagitis with no specific focal lesion or stricture -nystatin swish and swallow -symptoms improving  Dehydration  -IVF d/c'd encourage PO intake  Multiple myeloma ([place]) -  Per Dr. Griffith Citron: 6 months and possibly only 6-8 weeks because of [place] multiple problems, even though the myeloma remains well controlled.  Hypertension  -resume home meds  Urinary retention -resolved-- only required 1 in and out cath  Anemia of chronic disease stable continue to monitor current asymptomatic   Chronic systolic CHF (congestive heart failure), NYHA class 2 ([place]) avoid fluid overload but currently appears to be dehydrated we'll continue to hold Lasix   Acute on chronic renal failure (HCC) hold lisinopril. Likely secondary to dehydration. IVF  bacteria in urine - nothing on culture  Labs in AM Ambulate Incentive spirometry   Code Status: DNR Family Communication: patient/wife at bedside Disposition Plan: home in the AM   Consultants:  GI  Palliative care  Procedures:  HPI/Subjective: Very  sleepy today   Objective: Filed Vitals:   01/04/16 0541 01/04/16 1322  BP:  120/62  Pulse:  111  Temp: 99.4 F (37.4 C) 99 F (37.2 C)  Resp:      Intake/Output Summary (Last 24 hours) at 01/04/16 1351 Last data filed at 01/04/16 0900  Gross per 24 hour  Intake 881.67 ml  Output    300 ml  Net 581.67 ml   Filed Weights   01/01/16 2130 01/03/16 0919  Weight: 79.379 kg (175 lb) 79.379 kg (175 lb)    Exam:   General:  Awake, chronically ill appearing  Cardiovascular: rrr  Respiratory: clear  Abdomen: +BS, soft  Musculoskeletal: no edema   Data Reviewed: Basic Metabolic Panel:  Recent Labs Lab 01/01/16 1626 01/02/16 0640 01/04/16 0640  NA 141 143 149*  K 4.1 3.7 3.6  CL 110 114* 119*  CO2 13* 16* 17*  GLUCOSE 120* 103* 107*  BUN 83* 77* 43*  CREATININE 2.88* 2.20* 1.70*  CALCIUM 8.2* 8.0* 8.7*  MG  --  2.3  --   PHOS  --  4.3  --    Liver Function Tests:  Recent Labs Lab 12/28/15 1535 01/01/16 1626 01/02/16 0640  AST 107* 109* 86*  ALT '25 30 28  '$ ALKPHOS 201* 303* 270*  BILITOT 0.7 0.8 0.7  PROT 7.1 7.6 7.0  ALBUMIN 3.6 3.0* 2.7*   No results for input(s): LIPASE, AMYLASE in the last 168 hours. No results for input(s): AMMONIA in the last 168 hours. CBC:  Recent Labs Lab 01/01/16 1626 01/02/16 0640 01/04/16 0640  WBC 14.0* 11.3* 11.9*  NEUTROABS 11.5*  --   --   HGB 9.3* 8.7* 9.3*  HCT 28.9*  27.6* 30.5*  MCV 83.3 84.4 86.2  PLT 102* 104* 68*   Cardiac Enzymes: No results for input(s): CKTOTAL, CKMB, CKMBINDEX, TROPONINI in the last 168 hours. BNP (last 3 results) No results for input(s): BNP in the last 8760 hours.  ProBNP (last 3 results) No results for input(s): PROBNP in the last 8760 hours.  CBG: No results for input(s): GLUCAP in the last 168 hours.  Recent Results (from the past 240 hour(s))  TECHNOLOGIST REVIEW     Status: None   Collection Time: 12/28/15 10:09 AM  Result Value Ref Range Status   Technologist  Review   Final    Metas and Myelocytes present. few ovalos & teardrops  Culture, Urine     Status: None   Collection Time: 01/01/16  5:13 PM  Result Value Ref Range Status   Specimen Description URINE, CLEAN CATCH  Final   Special Requests NONE  Final   Culture   Final    MULTIPLE SPECIES PRESENT, SUGGEST RECOLLECTION Performed at [facility]    Report Status 01/03/2016 FINAL  Final     Studies: No results found.  Scheduled Meds: . fluconazole (DIFLUCAN) IV  200 mg Intravenous Q24H  . metoprolol  2.5 mg Intravenous 4 times per day  . mirtazapine  15 mg Oral QHS  . nystatin  5 mL Oral QID  . pantoprazole  40 mg Oral BID  . tamsulosin  0.4 mg Oral BID   Continuous Infusions:   Antibiotics Given (last 72 hours)    None      Active Problems:   Multiple myeloma (HCC)   Hypertension   Hypokalemia   Anemia of chronic disease   Dehydration   Chronic systolic CHF (congestive heart failure), NYHA class 2 (HCC)   Acute on chronic renal failure ([place])   Candidal esophagitis ([place])   Odynophagia   Encounter for palliative care    Time spent: 25 min    [facility] Pager [phone]. If 7PM-7AM, please contact night-coverage at [email or web address], password [facility] 01/04/2016, 1:51 PM  LOS: 3 days

## 2016-01-04 NOTE — Progress Notes (Signed)
Pharmacy Antibiotic Note  Timothy [name] is a 82 y.o. male admitted on 01/01/2016 with candidal esophagitis.  Pharmacy has been consulted for fluconazole dosing.  - Tmax 100, wbc 11.9, scr trending down 1.70 (crcl~33)   Plan: Day #4 of abx:  - continue fluconazole 200 mg IV q24h   Height: 5\' 10"  (177.8 cm) Weight: 175 lb (79.379 kg) IBW/kg (Calculated) : 73  Temp (24hrs), Avg:98.5 F (36.9 C), Min:97.6 F (36.4 C), Max:100 F (37.8 C)   Recent Labs Lab 12/28/15 1009 12/28/15 1010 01/01/16 1626 01/02/16 0640 01/04/16 0640  WBC 8.2  --  14.0* 11.3* 11.9*  CREATININE  --  1.6* 2.88* 2.20* 1.70*    Estimated Creatinine Clearance: 32.8 mL/min (by C-G formula based on Cr of 1.7).    Allergies  Allergen Reactions  . Morphine And Related Other (See Comments)    Agitated with morphine drip    Antimicrobials this admission: 2/11 Fluconazole >> 2/12 Nystatin >>  Microbiology results: 2/11 UCx: multiple species FINAL  Thank you for allowing pharmacy to be a part of this patient's care.  Lynelle Doctor, PharmD 01/04/2016 8:10 AM

## 2016-01-05 ENCOUNTER — Ambulatory Visit: Payer: Medicare Other | Admitting: Nurse Practitioner

## 2016-01-05 DIAGNOSIS — Z515 Encounter for palliative care: Secondary | ICD-10-CM

## 2016-01-05 LAB — BASIC METABOLIC PANEL
Anion gap: 11 (ref 5–15)
BUN: 52 mg/dL — ABNORMAL HIGH (ref 6–20)
CALCIUM: 8.6 mg/dL — AB (ref 8.9–10.3)
CHLORIDE: 117 mmol/L — AB (ref 101–111)
CO2: 18 mmol/L — ABNORMAL LOW (ref 22–32)
CREATININE: 2.12 mg/dL — AB (ref 0.61–1.24)
GFR, EST AFRICAN AMERICAN: 31 mL/min — AB (ref >60)
GFR, EST NON AFRICAN AMERICAN: 27 mL/min — AB (ref >60)
Glucose, Bld: 118 mg/dL — ABNORMAL HIGH (ref 65–99)
Potassium: 3.9 mmol/L (ref 3.5–5.1)
SODIUM: 146 mmol/L — AB (ref 135–145)

## 2016-01-05 LAB — CBC
HCT: 28.8 % — ABNORMAL LOW (ref 39.0–52.0)
HEMOGLOBIN: 8.6 g/dL — AB (ref 13.0–17.0)
MCH: 26.5 pg (ref 26.0–34.0)
MCHC: 29.9 g/dL — AB (ref 30.0–36.0)
MCV: 88.6 fL (ref 78.0–100.0)
Platelets: 53 10*3/uL — ABNORMAL LOW (ref 150–400)
RBC: 3.25 MIL/uL — ABNORMAL LOW (ref 4.22–5.81)
RDW: 21.4 % — AB (ref 11.5–15.5)
WBC: 11.2 10*3/uL — ABNORMAL HIGH (ref 4.0–10.5)

## 2016-01-05 MED ORDER — TRAZODONE HCL 50 MG PO TABS: 50.0000 mg | ORAL_TABLET | Freq: Every evening | ORAL | Status: AC | PRN

## 2016-01-05 MED ORDER — HEPARIN SOD (PORK) LOCK FLUSH 100 UNIT/ML IV SOLN
250.0000 [IU] | INTRAVENOUS | Status: AC | PRN
  Administered 2016-01-05: 250 [IU]
  Filled 2016-01-05: qty 5

## 2016-01-05 MED ORDER — FLUCONAZOLE 200 MG PO TABS: 200.0000 mg | ORAL_TABLET | Freq: Every day | ORAL | Status: AC

## 2016-01-05 MED ORDER — TRAZODONE HCL 50 MG PO TABS: 50.0000 mg | ORAL_TABLET | Freq: Every evening | ORAL | Status: DC | PRN

## 2016-01-05 MED ORDER — ACETAMINOPHEN 325 MG PO TABS: 650.0000 mg | ORAL_TABLET | Freq: Four times a day (QID) | ORAL | Status: AC | PRN

## 2016-01-05 NOTE — Progress Notes (Signed)
This CM followed up with wife this am about [facility] services.  She states she spoke with the pt and he does not want HHPT.  She also states she would like to take care of the picc line at home herself instead of having a HHRN come in.  She was already flushing the picc prior to admission.  Staff RN to teach wife how to change the dressing.  She was encouraged to call his MD if she encounters any problems with the picc at home.  She states she would like to keep it in due to him might needing pain medication by IV in the future. Dr. Eliseo Squires ok with this as well.  Pt and wife very appreciative of CM involvement. Marney Doctor RN,BSN,NCM [phone]

## 2016-01-05 NOTE — Discharge Summary (Signed)
Physician Discharge Summary  Timothy [name] LZJ:[id number] DOB: [birth year] DOA: 01/01/2016  PCP: [name], MD  Admit date: 01/01/2016 Discharge date: 01/05/2016  Time spent: 35 minutes  Recommendations for Outpatient Follow-up:  1. Palliative care to evaluate at home 2. Wife to change PICC line dressing 3. DNR 4. DME for wheelchair   Discharge Diagnoses:  Active Problems:   Multiple myeloma (HCC)   Hypertension   Hypokalemia   Anemia of chronic disease   Dehydration   Chronic systolic CHF (congestive heart failure), NYHA class 2 (HCC)   Acute on chronic renal failure (HCC)   Candidal esophagitis ([place])   Odynophagia   Encounter for palliative care   Discharge Condition: stable  Diet recommendation: as tolerated  Filed Weights   01/01/16 2130 01/03/16 0919  Weight: 79.379 kg (175 lb) 79.379 kg (175 lb)    History of present illness:  Timothy [name] is a 82 y.o. male   has a past medical history of ASCVD (arteriosclerotic cardiovascular disease); Gout; Hypertension; Lumbar disc disease; History of echocardiogram (11/02/2010); CHF (congestive heart failure) ([place]); Heart murmur; Ischemic cardiomyopathy; Renal insufficiency; Peripheral neuropathy ([place]); Hyperlipidemia; SOB (shortness of breath); Fatigue; Peripheral neuropathy ([place]); Coronary artery disease; Blood transfusion (10/2011; 07/2013); History of shingles (MARCH 2009); Osteomyelitis ([place]); Chronic systolic heart failure ([place]); Protein malnutrition ([place]); Critical lower limb ischemia; Peripheral vascular disease ([place]); cardiovascular stress test; Inferior myocardial infarction ([place]) (1986); Pulmonary embolism ([place]); Chronic anemia; Chronic lower back pain; Multiple myeloma ([place]) (dx'd 04/2006); and Basal cell carcinoma.   Presented with worsening overall performance status and odynophagia for 3 days. Patient started to have trouble ambulating have had increased unsteadiness. He has had decreased by mouth intake no  appetite he has had some episodes of spitting up and difficulty with swallowing. He has had significant hand when he tries to swallow anything yesterday he got a prescription for nystatin swish and swallow . He has lost some weight about 10 pounds in the past month Patient reports low back pain. Reports some shortness of breath increased with exertion. No leg swelling. He has not had any nausea or vomiting or fever.  Hospital Course:  Candidal esophagitis (HCC) resulting in severe odynophagia presumed candidal esophagitis will treat with fluconazole x 2 weeks EGD shows very mild nonspecific diffuse esophagitis with no specific focal lesion or stricture -nystatin swish and swallow -symptoms improving  Dehydration  -IVF d/c'd encourage PO intake  Multiple myeloma ([place]) -  Per Dr. Griffith Citron: 6 months and possibly only 6-8 weeks because of multiple problems, even though the myeloma remains well controlled.  Hypertension  -resume home meds  Urinary retention -resolved-- only required 1 in and out cath Wife know to return to ER if patient unable to urinate  Anemia of chronic disease stable continue to monitor current asymptomatic   Chronic systolic CHF (congestive heart failure), NYHA class 2 ([place]) avoid fluid overload but currently appears to be dehydrated   continue to hold Lasix   Acute on chronic renal failure (HCC)  Use lasix as PRN for SOB/swelling   bacteria in urine - nothing on culture  Procedures:  EGD  Consultations:  GI  Discharge Exam: Filed Vitals:   01/05/16 0506 01/05/16 0748  BP: 112/49 106/66  Pulse: 103   Temp: 97.5 F (36.4 C)   Resp: 18     General: anxious to go home-- says hospital bed uncomfortable   Discharge Instructions   Discharge Instructions    Discharge instructions    Complete by:  As directed   Palliative care to follow at home diflucan for 2 weeks Encourage PO water intake Diet as tolerated Bmp, cbc 1-2 weeks     Increase  activity slowly    Complete by:  As directed           Current Discharge Medication List    START taking these medications   Details  acetaminophen (TYLENOL) 325 MG tablet Take 2 tablets (650 mg total) by mouth every 6 (six) hours as needed for mild pain (or Fever >/= 101).    fluconazole (DIFLUCAN) 200 MG tablet Take 1 tablet (200 mg total) by mouth at bedtime. Qty: 14 tablet, Refills: 0    traZODone (DESYREL) 50 MG tablet Take 1 tablet (50 mg total) by mouth at bedtime as needed for sleep. Qty: 30 tablet, Refills: 0      CONTINUE these medications which have NOT CHANGED   Details  carvedilol (COREG) 6.25 MG tablet TAKE 1 TABLET TWICE A DAY WITH MEALS Qty: 180 tablet, Refills: 2    HYDROmorphone (DILAUDID) 4 MG tablet Take 1 tablet (4 mg total) by mouth every 4 (four) hours as needed for severe pain. Qty: 120 tablet, Refills: 0   Associated Diagnoses: Multiple myeloma, remission status unspecified (HCC)    isosorbide mononitrate (IMDUR) 30 MG 24 hr tablet Take 30 mg by mouth at bedtime.     mirtazapine (REMERON) 15 MG tablet Take 15 mg by mouth at bedtime.    Multiple Vitamin (MULTIVITAMIN) capsule Take 1 capsule by mouth every morning.     nystatin (MYCOSTATIN) 100000 UNIT/ML suspension Take 5 mLs (500,000 Units total) by mouth 4 (four) times daily. Qty: 180 mL, Refills: 0   Associated Diagnoses: Acute on chronic systolic congestive heart failure (HCC)    rOPINIRole (REQUIP) 3 MG tablet Take 1 tablet (3 mg total) by mouth at bedtime. Qty: 90 tablet, Refills: 11    tamsulosin (FLOMAX) 0.4 MG CAPS capsule Take 0.4 mg by mouth 2 (two) times daily.     vitamin C (ASCORBIC ACID) 500 MG tablet Take 250 mg by mouth 2 (two) times daily.     B Complex-Biotin-FA (B COMPLETE PO) Take 1 tablet by mouth daily.     darbepoetin (ARANESP) 200 MCG/0.4ML SOLN injection Inject 200 mcg into the skin as directed.    diphenoxylate-atropine (LOMOTIL) 2.5-0.025 MG tablet Take 1 tablet by  mouth 4 (four) times daily as needed for diarrhea or loose stools. Qty: 30 tablet, Refills: 0   Associated Diagnoses: Multiple myeloma, remission status unspecified (HCC)    polyethylene glycol (MIRALAX / GLYCOLAX) packet Take 17 g by mouth daily as needed. Qty: 14 each, Refills: 0      STOP taking these medications     aspirin 81 MG chewable tablet      dexamethasone (DECADRON) 4 MG tablet      Heparin Lock Flush (HEPARIN FLUSH, PORCINE,) 100 UNIT/ML injection      Ibuprofen-Diphenhydramine HCl (ADVIL PM) 200-25 MG CAPS      lisinopril (PRINIVIL,ZESTRIL) 20 MG tablet      nitroGLYCERIN (NITROSTAT) 0.4 MG SL tablet      sodium chloride 0.9 % SOLN 250 mL with cyclophosphamide 1 G SOLR 300 mg/m2      Sodium Chloride Flush 0.9 % SOLN injection      furosemide (LASIX) 40 MG tablet        Allergies  Allergen Reactions  . Morphine And Related Other (See Comments)    Agitated with morphine drip  Follow-up Information    Follow up with MAGRINAT,GUSTAV C, MD In 1 week.   Specialty:  Oncology   Contact information:   [place] [address] [phone]        The results of significant diagnostics from this hospitalization (including imaging, microbiology, ancillary and laboratory) are listed below for reference.    Significant Diagnostic Studies: Dg Chest 2 View  12/28/2015  CLINICAL DATA:  Shortness of breath worsening recently, some weakness, history of multiple myeloma EXAM: CHEST  2 VIEW COMPARISON:  Chest x-ray of 05/21/2015 FINDINGS: The images were taken with the patient in a wheelchair and inspiration is not optimal, with bibasilar linear atelectasis present. No focal infiltrate or effusion is seen. It is noted that the right PICC line extends cephalad presumably into the right internal jugular vein. Cardiomegaly is stable. Median sternotomy sutures are noted. No acute bony abnormality is seen. IMPRESSION: 1. Poor inspiration with bibasilar  atelectasis. 2. Right PICC line tip extends cephalad probably into the right internal jugular vein. Electronically Signed   By: Ivar Drape M.D.   On: 12/28/2015 13:14   US Abdomen Complete  12/28/2015  CLINICAL DATA:  Dyspnea. Elevated liver function tests. History of multiple myeloma. Initial encounter. EXAM: ABDOMEN ULTRASOUND COMPLETE COMPARISON:  None. FINDINGS: Gallbladder: No gallstones or wall thickening visualized. No sonographic Murphy sign noted by sonographer. Common bile duct: Diameter: 0.2 cm Liver: No focal lesion identified. The portal triads demonstrate increased echogenicity. There appears to be subtle nodularity of the liver border. IVC: No abnormality visualized. Pancreas: Visualized portion unremarkable. Spleen: Volume is 844.7 ml consistent splenomegaly. No focal splenic lesion. Right Kidney: Length: 11.3 cm. Cortical echogenicity appears increased. No hydronephrosis visualized. Simple 1.7 cm cyst in the midpole noted. Left Kidney: Length: 10.5 cm. Cortical echogenicity appears increase. No mass or hydronephrosis visualized. Abdominal aorta: No aneurysm visualized. Other findings: None. IMPRESSION: Increased echogenicity of the portal triads is nonspecific but can be seen in hepatitis. Subtle nodularity of the liver border suggestive of cirrhosis. Splenomegaly. Findings compatible with medical renal disease. Electronically Signed   By: Inge Rise M.D.   On: 12/28/2015 19:09    Microbiology: Recent Results (from the past 240 hour(s))  TECHNOLOGIST REVIEW     Status: None   Collection Time: 12/28/15 10:09 AM  Result Value Ref Range Status   Technologist Review   Final    Metas and Myelocytes present. few ovalos & teardrops  Culture, Urine     Status: None   Collection Time: 01/01/16  5:13 PM  Result Value Ref Range Status   Specimen Description URINE, CLEAN CATCH  Final   Special Requests NONE  Final   Culture   Final    MULTIPLE SPECIES PRESENT, SUGGEST  RECOLLECTION Performed at [facility]    Report Status 01/03/2016 FINAL  Final     Labs: Basic Metabolic Panel:  Recent Labs Lab 01/01/16 1626 01/02/16 0640 01/04/16 0640 01/05/16 0500  NA 141 143 149* 146*  K 4.1 3.7 3.6 3.9  CL 110 114* 119* 117*  CO2 13* 16* 17* 18*  GLUCOSE 120* 103* 107* 118*  BUN 83* 77* 43* 52*  CREATININE 2.88* 2.20* 1.70* 2.12*  CALCIUM 8.2* 8.0* 8.7* 8.6*  MG  --  2.3  --   --   PHOS  --  4.3  --   --    Liver Function Tests:  Recent Labs Lab 01/01/16 1626 01/02/16 0640  AST 109* 86*  ALT 30  28  ALKPHOS 303* 270*  BILITOT 0.8 0.7  PROT 7.6 7.0  ALBUMIN 3.0* 2.7*   No results for input(s): LIPASE, AMYLASE in the last 168 hours. No results for input(s): AMMONIA in the last 168 hours. CBC:  Recent Labs Lab 01/01/16 1626 01/02/16 0640 01/04/16 0640 01/05/16 0500  WBC 14.0* 11.3* 11.9* 11.2*  NEUTROABS 11.5*  --   --   --   HGB 9.3* 8.7* 9.3* 8.6*  HCT 28.9* 27.6* 30.5* 28.8*  MCV 83.3 84.4 86.2 88.6  PLT 102* 104* 68* 53*   Cardiac Enzymes: No results for input(s): CKTOTAL, CKMB, CKMBINDEX, TROPONINI in the last 168 hours. BNP: BNP (last 3 results) No results for input(s): BNP in the last 8760 hours.  ProBNP (last 3 results) No results for input(s): PROBNP in the last 8760 hours.  CBG: No results for input(s): GLUCAP in the last 168 hours.     Signed:  Malaka Ruffner U Andrina Locken DO.  [facility] 01/05/2016, 11:06 AM

## 2016-01-05 NOTE — Progress Notes (Signed)
Patient d/c home, stable. Pain is controlled.

## 2016-01-05 NOTE — Progress Notes (Signed)
PICC line dressing changed discussed with wife, materials needed for dsg changes brought to room so that wife will familiarized herself with it. Wife is confident that she will be  able to successfully perform  the procedure. Wife will bring home a urinal per care order. D/c instructions also rendered, wife verbalized understanding. Teach back utilized.ICC line flushed per order. Site unremarkable.

## 2016-01-05 NOTE — Progress Notes (Signed)
[facility] is providing the following services: Wheelchair (patient to pickup at retail store)  If patient discharges after hours, please call [phone].   Linward Headland 01/05/2016, 8:26 AM

## 2016-01-05 NOTE — Progress Notes (Signed)
Timothy [name]   DOB:05-03-1928   HY#:[id number]   INO#:[id number]  Subjective: [name]. Is still having some trouble swallowing and expresses some anxiety about going home, but also looking forward to go home today. He feels he will get stronger there. His sis in law will be coming down to help. No family in room  Objective: older White man examined in bed Filed Vitals:   01/05/16 0506 01/05/16 0748  BP: 112/49 106/66  Pulse: 103   Temp: 97.5 F (36.4 C)   Resp: 18     Body mass index is 25.11 kg/(m^2).  Intake/Output Summary (Last 24 hours) at 01/05/16 0813 Last data filed at 01/05/16 0315  Gross per 24 hour  Intake    240 ml  Output    300 ml  Net    -60 ml    Sclerae unicteic, EOMs inract Lungs no rales or wheezes-- ausculltated anterolaterally +BS A&O x3 . CBG (last 3)  No results for input(s): GLUCAP in the last 72 hours.   Labs:  Lab Results  Component Value Date   WBC 11.2* 01/05/2016   HGB 8.6* 01/05/2016   HCT 28.8* 01/05/2016   MCV 88.6 01/05/2016   PLT 53* 01/05/2016   NEUTROABS 11.5* 01/01/2016    _0 @  Urine Studies No results for input(s): UHGB, CRYS in the last 72 hours.  Invalid input(s): UACOL, UAPR, USPG, UPH, UTP, UGL, UKET, UBIL, UNIT, UROB, [place], UEPI, UWBC, [place], [place], [place], [place], [place]  Basic Metabolic Panel:  Recent Labs Lab 01/01/16 1626 01/02/16 0640 01/04/16 0640 01/05/16 0500  NA 141 143 149* 146*  K 4.1 3.7 3.6 3.9  CL 110 114* 119* 117*  CO2 13* 16* 17* 18*  GLUCOSE 120* 103* 107* 118*  BUN 83* 77* 43* 52*  CREATININE 2.88* 2.20* 1.70* 2.12*  CALCIUM 8.2* 8.0* 8.7* 8.6*  MG  --  2.3  --   --   PHOS  --  4.3  --   --    GFR Estimated Creatinine Clearance: 26.3 mL/min (by C-G formula based on Cr of 2.12). Liver Function Tests:  Recent Labs Lab 01/01/16 1626 01/02/16 0640  AST 109* 86*  ALT 30 28  ALKPHOS 303* 270*  BILITOT 0.8 0.7  PROT 7.6 7.0  ALBUMIN 3.0* 2.7*   No results for input(s): LIPASE,  AMYLASE in the last 168 hours. No results for input(s): AMMONIA in the last 168 hours. Coagulation profile No results for input(s): INR, PROTIME in the last 168 hours.  CBC:  Recent Labs Lab 01/01/16 1626 01/02/16 0640 01/04/16 0640 01/05/16 0500  WBC 14.0* 11.3* 11.9* 11.2*  NEUTROABS 11.5*  --   --   --   HGB 9.3* 8.7* 9.3* 8.6*  HCT 28.9* 27.6* 30.5* 28.8*  MCV 83.3 84.4 86.2 88.6  PLT 102* 104* 68* 53*   Cardiac Enzymes: No results for input(s): CKTOTAL, CKMB, CKMBINDEX, TROPONINI in the last 168 hours. BNP: Invalid input(s): POCBNP CBG: No results for input(s): GLUCAP in the last 168 hours. D-Dimer No results for input(s): DDIMER in the last 72 hours. Hgb A1c No results for input(s): HGBA1C in the last 72 hours. Lipid Profile No results for input(s): CHOL, HDL, LDLCALC, TRIG, CHOLHDL, LDLDIRECT in the last 72 hours. Thyroid function studies No results for input(s): TSH, T4TOTAL, T3FREE, THYROIDAB in the last 72 hours.  Invalid input(s): FREET3 Anemia work up No results for input(s): VITAMINB12, FOLATE, FERRITIN, TIBC, IRON, RETICCTPCT in the last 72 hours. Microbiology Recent Results (from the  past 240 hour(s))  TECHNOLOGIST REVIEW     Status: None   Collection Time: 12/28/15 10:09 AM  Result Value Ref Range Status   Technologist Review   Final    Metas and Myelocytes present. few ovalos & teardrops  Culture, Urine     Status: None   Collection Time: 01/01/16  5:13 PM  Result Value Ref Range Status   Specimen Description URINE, CLEAN CATCH  Final   Special Requests NONE  Final   Culture   Final    MULTIPLE SPECIES PRESENT, SUGGEST RECOLLECTION Performed at [facility]    Report Status 01/03/2016 FINAL  Final      Studies:  No results found.  Assessment: 82 y.o. with kappa light chain multiple myeloma admitted 01/01/2016 with severe candidal esophagitis  (1) presenting April 2007 with anemia and renal failure; renal Bx showing free kappa  light chain deposition; bone marrow biopsy showing 37% plasma cells, with normal cytogenetics and FISH;bone survey showing skull lytic lesions; treated with   (2) thalidomide (200 mg/d) and dexamethasone (40 mg/d x4d Q28d) June through Nov 2007, with good response, but poor tolerance;   (3) thalidomide decreased to 100 mg/d, dexamethasone continued, bortezomib (IV) added, Dec 2007 to March 2008   (4) treatment interrupted by multiple complications (peripheral neuropathy, pulmonary embolism, diverticular abscess, CN V zoster, severe DDD, congestive heart failure, rising PSA); maintenance zolendronic acid through March 2012   (5) progression April 2012, treated initially with dexamethasone alone, poorly tolerated;   (6) bortezomib (sq) resumed July 2012; cyclophosphamide and dexamethasone added Sept 2012; zolendronic acid changed to Q 4month; all treatments held as of March 2013 due to the development of the Left foot ulcer described above  (7) anemia, on aranesp December 2012 to August 2013; resumed Q14d starting May 2015, with slowly rising reticulocyte count  (8) status post Right trochanteric nail with proximal and distal interlock DePuy 11 mm one-pin lag screw, 44 mm distal interlock 08/09/2013 for a right comminuted intertrochanteric fracture  (9) status post removal of the first 2 toes left foot  (10) Left common femoral artery endarterectomy with vein patch angioplasty using left greater saphenous vein 03/24/2014  (11) status post fall with injury to the right hip, large hematoma, [facility]  (12) on 10/09/2014 starting cyclophosphamide at 300 mg/m every 14 days and dexamethasone 20 mg daily 2 days each week-- treatment currently on hold  (13) SIADH: On fluid restriction, reduced to 1.5L on 12/04/14 because of drop in sodium  (14) chronic systolic congestive heart failure, with EF 35% per echo, with history CAD s/p CABG  2002  (15) CKD stage III  (16) peripheral vascular disease, status post outpatient stage diamondback orbital rotational atherectomy along with PTCA and placement of Viabahn covered stent 06/03/2015 for high grade calcified prox L common illiac artery stenosis noted on cath 05/27/2015  (17) poor access: Status post right-sided PICC placement 07/06/2015 (a[facility], wife flushes PICC every Thurs and SAT     Plan: .Discussed the fact that (in his words) "it may be short." It's good to try to get better, work with OT/PT, learn to get around in the wc, etc. But it is also important to let family know--especially his children, who live far away--that it "may be short" and that if they want to visit this may be a good time.  Is having itching L periorbital region (where he had shingles). He has either valtrext  500 mg or acyclovir 400 mg at [facility]-- either would help; I wrote this down for him and if they need a refill they will call.  He has an appointment with me FEB 22. If it is too difficult for him to make it to that appointment we will make other arrangements.  Would provide him with an out of facility DNR at d/c  Greatly appreciate yur help to this patient and his family!  [name], MD 01/05/2016  8:13 AM Medical Oncology and Hematology [facility] [address] [place], [place] [address] Tel. [phone]    Fax. [phone]

## 2016-01-06 ENCOUNTER — Telehealth: Payer: Self-pay | Admitting: *Deleted

## 2016-01-06 DIAGNOSIS — C9000 Multiple myeloma not having achieved remission: Secondary | ICD-10-CM

## 2016-01-06 MED ORDER — MORPHINE SULFATE (CONCENTRATE) 20 MG/ML PO SOLN: ORAL | Status: AC

## 2016-01-06 NOTE — Telephone Encounter (Signed)
Newman Nickels RN with Hospice called [phone]).  Admitting patient to Hospice services.  Need the following orders. 1. He takes one dilaudid 4 mg tablet every four hours.  He has had trouble swallowing and could use a liquid form.  Send to [facility] in [place].   2. His oxygen saturation on room air = 86 to 89 %.  Can he have oxygen. 3. Trouble urinating and has been incontinent of urine.  Abdomen is taught.  Can we have an order for a catheter.

## 2016-01-07 ENCOUNTER — Ambulatory Visit: Payer: Medicare Other | Admitting: Podiatry

## 2016-01-11 ENCOUNTER — Other Ambulatory Visit: Payer: Self-pay | Admitting: Oncology

## 2016-01-11 ENCOUNTER — Ambulatory Visit: Payer: Medicare Other

## 2016-01-11 ENCOUNTER — Telehealth: Payer: Self-pay | Admitting: Nurse Practitioner

## 2016-01-11 ENCOUNTER — Other Ambulatory Visit: Payer: Medicare Other

## 2016-01-11 NOTE — Telephone Encounter (Signed)
Spoke with wife [name] today [name] passed away yesterday afternoon.   My condolences to her.

## 2016-01-11 NOTE — Telephone Encounter (Signed)
New message      Alanson Puls is calling on behalf of Dr Clemetine Marker.  They request to talk to the nurse.  He would not tell me what they wanted

## 2016-01-12 ENCOUNTER — Other Ambulatory Visit: Payer: Medicare Other

## 2016-01-12 ENCOUNTER — Ambulatory Visit: Payer: Medicare Other

## 2016-01-12 ENCOUNTER — Ambulatory Visit: Payer: Medicare Other | Admitting: Oncology

## 2016-01-19 DEATH — deceased

## 2016-01-25 ENCOUNTER — Ambulatory Visit: Payer: Medicare Other

## 2016-01-25 ENCOUNTER — Other Ambulatory Visit: Payer: Medicare Other

## 2016-02-08 ENCOUNTER — Other Ambulatory Visit: Payer: Medicare Other

## 2016-02-08 ENCOUNTER — Ambulatory Visit: Payer: Medicare Other

## 2016-02-09 ENCOUNTER — Ambulatory Visit: Payer: Medicare Other | Admitting: Cardiology

## 2016-07-17 IMAGING — US IR US GUIDE VASC ACCESS RIGHT
1 series · 2 of 2 positions shown · non-contrast
Comparison: none

CLINICAL DATA: Multiple myeloma. Poor peripheral venous access,
needs access for chemotherapy.

EXAM:
PICC PLACEMENT WITH ULTRASOUND AND FLUOROSCOPY
FLUOROSCOPY TIME:  54 seconds, 11 mGy
TECHNIQUE: After written informed consent was obtained, patient was placed in
the supine position on angiographic table. Patency of the right
basilic vein was confirmed with ultrasound with image documentation.
An appropriate skin site was determined. Skin site was marked.
Region was prepped using maximum barrier technique including cap and
mask, sterile gown, sterile gloves, large sterile sheet, and
Chlorhexidine as cutaneous antisepsis. The region was infiltrated
locally with 1% lidocaine. Under real-time ultrasound guidance, the
right basilic vein was accessed with a 21 gauge micropuncture
needle; the needle tip within the vein was confirmed with ultrasound
image documentation. Needle exchanged over a 018 guidewire for a
peel-away sheath, through which a 5-French double-lumen power
injectable PICC trimmed to 38cm was advanced, positioned with its
tip near the cavoatrial junction. Spot chest radiograph confirms
appropriate catheter position. Catheter was flushed per protocol and
secured externally. The patient tolerated procedure well.
COMPLICATIONS:
COMPLICATIONS
none

[Series 1: ir fluoro/shunt/fist · 2 of 2 slices shown]
[im 1/2]
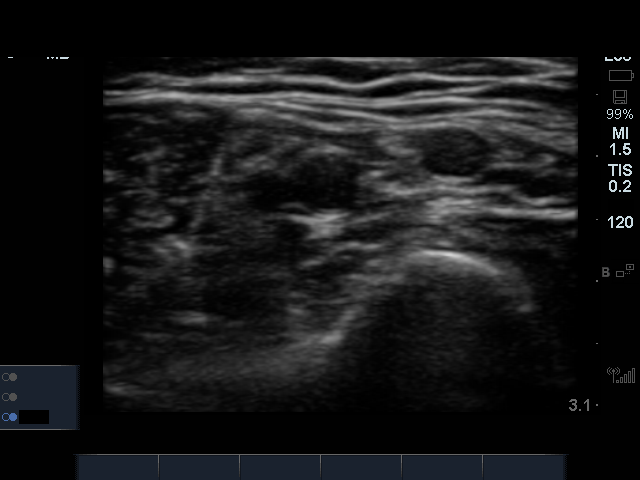
[im 2/2]
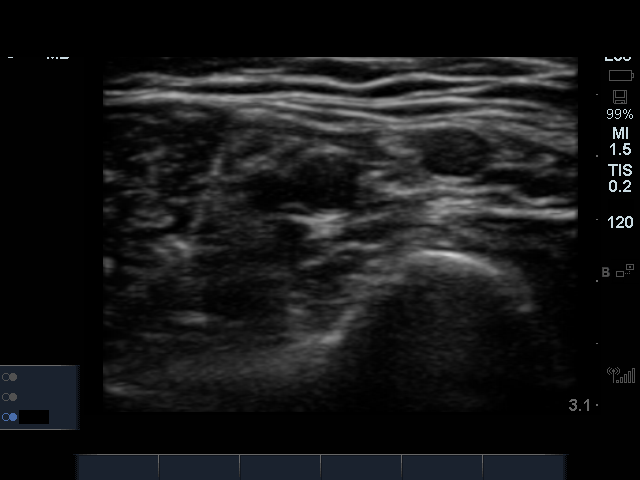

[2 of 2 positions shown; findings below may reference images not displayed]

IMPRESSION: 1. Technically successful five French double lumen power injectable
PICC placement

## 2016-07-17 IMAGING — XA IR US GUIDE VASC ACCESS RIGHT
1 series · 1 of 1 positions shown · non-contrast
Comparison: none

CLINICAL DATA: Multiple myeloma. Poor peripheral venous access,
needs access for chemotherapy.

EXAM:
PICC PLACEMENT WITH ULTRASOUND AND FLUOROSCOPY
FLUOROSCOPY TIME:  54 seconds, 11 mGy
TECHNIQUE: After written informed consent was obtained, patient was placed in
the supine position on angiographic table. Patency of the right
basilic vein was confirmed with ultrasound with image documentation.
An appropriate skin site was determined. Skin site was marked.
Region was prepped using maximum barrier technique including cap and
mask, sterile gown, sterile gloves, large sterile sheet, and
Chlorhexidine as cutaneous antisepsis. The region was infiltrated
locally with 1% lidocaine. Under real-time ultrasound guidance, the
right basilic vein was accessed with a 21 gauge micropuncture
needle; the needle tip within the vein was confirmed with ultrasound
image documentation. Needle exchanged over a 018 guidewire for a
peel-away sheath, through which a 5-French double-lumen power
injectable PICC trimmed to 38cm was advanced, positioned with its
tip near the cavoatrial junction. Spot chest radiograph confirms
appropriate catheter position. Catheter was flushed per protocol and
secured externally. The patient tolerated procedure well.
COMPLICATIONS:
COMPLICATIONS
none

[Series 300: ir fluoro guide cv line left · 1 of 1 slices shown]
[im 1/1]
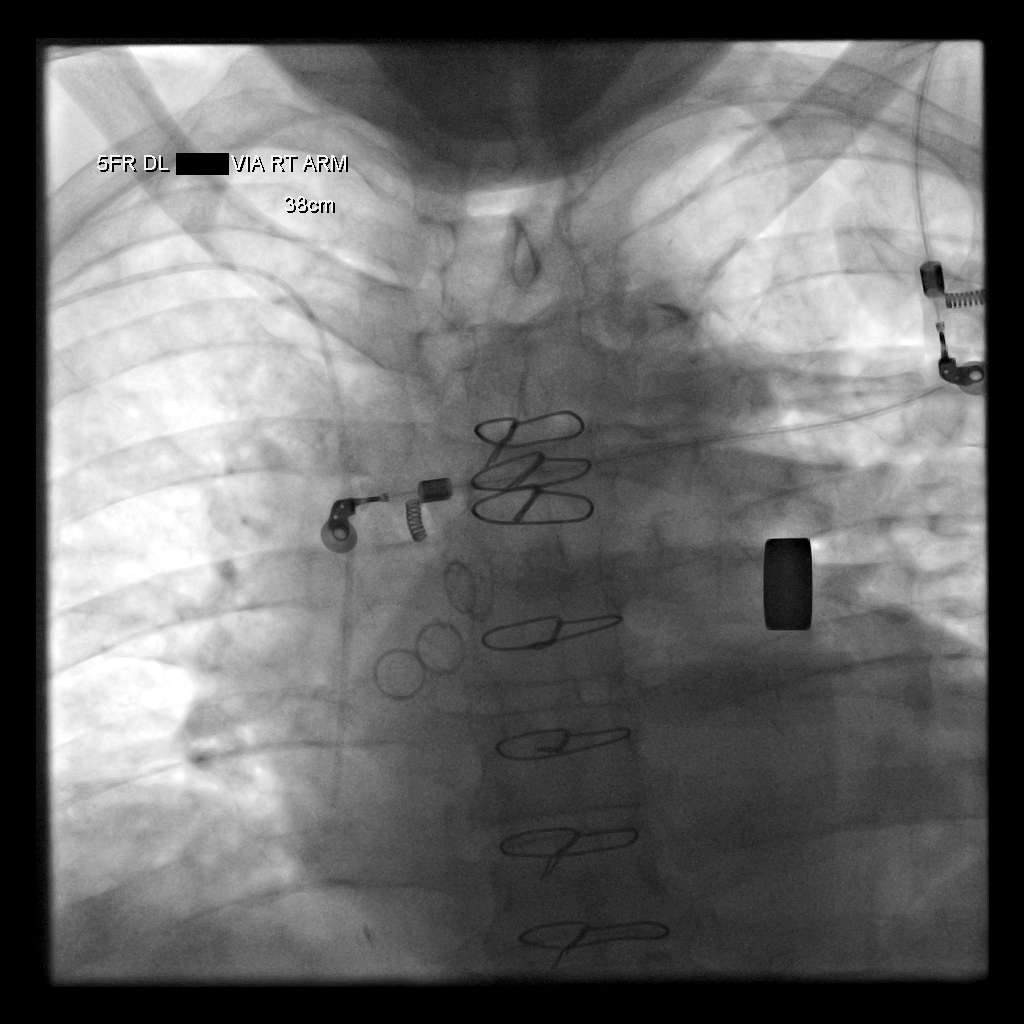

[1 of 1 positions shown; findings below may reference images not displayed]

IMPRESSION: 1. Technically successful five French double lumen power injectable
PICC placement

## 2017-02-05 ENCOUNTER — Encounter: Payer: Self-pay | Admitting: Oncology

## 2017-03-02 ENCOUNTER — Other Ambulatory Visit: Payer: Self-pay | Admitting: Nurse Practitioner
